# Patient Record
Sex: Female | Born: 1950 | Race: Black or African American | Hispanic: No
Health system: Midwestern US, Community
[De-identification: ages and names within clinical notes are randomized; demographics above are authoritative.]

## PROBLEM LIST (undated history)

## (undated) VITALS — BP 126/63 | HR 80 | Ht 62.0 in | Wt 192.0 lb

## (undated) VITALS — BP 98/52 | HR 88 | Ht 62.0 in | Wt 198.0 lb

## (undated) VITALS — BP 108/51 | HR 88 | Ht 62.0 in | Wt 187.0 lb

## (undated) VITALS — HR 89 | Ht 62.0 in | Wt 193.2 lb

## (undated) VITALS — BP 107/51 | HR 105 | Ht 62.0 in | Wt 184.0 lb

## (undated) VITALS — BP 130/78 | HR 86 | Ht 62.0 in | Wt 191.0 lb

## (undated) VITALS — HR 64 | Ht 62.0 in | Wt 200.0 lb

## (undated) VITALS — HR 96 | Ht 62.0 in | Wt 198.6 lb

## (undated) DIAGNOSIS — M545 Low back pain, unspecified: Secondary | ICD-10-CM

## (undated) DIAGNOSIS — R599 Enlarged lymph nodes, unspecified: Secondary | ICD-10-CM

## (undated) DIAGNOSIS — J45909 Unspecified asthma, uncomplicated: Secondary | ICD-10-CM

## (undated) DIAGNOSIS — M1990 Unspecified osteoarthritis, unspecified site: Secondary | ICD-10-CM

## (undated) DIAGNOSIS — G935 Compression of brain: Secondary | ICD-10-CM

## (undated) DIAGNOSIS — I10 Essential (primary) hypertension: Secondary | ICD-10-CM

## (undated) DIAGNOSIS — R059 Cough, unspecified: Secondary | ICD-10-CM

## (undated) DIAGNOSIS — B351 Tinea unguium: Secondary | ICD-10-CM

## (undated) DIAGNOSIS — E7800 Pure hypercholesterolemia, unspecified: Secondary | ICD-10-CM

## (undated) DIAGNOSIS — G8929 Other chronic pain: Secondary | ICD-10-CM

## (undated) DIAGNOSIS — I881 Chronic lymphadenitis, except mesenteric: Secondary | ICD-10-CM

## (undated) DIAGNOSIS — Z1159 Encounter for screening for other viral diseases: Secondary | ICD-10-CM

## (undated) DIAGNOSIS — F40240 Claustrophobia: Secondary | ICD-10-CM

## (undated) DIAGNOSIS — R7303 Prediabetes: Secondary | ICD-10-CM

## (undated) DIAGNOSIS — R9439 Abnormal result of other cardiovascular function study: Secondary | ICD-10-CM

## (undated) DIAGNOSIS — Z1231 Encounter for screening mammogram for malignant neoplasm of breast: Secondary | ICD-10-CM

## (undated) DIAGNOSIS — G5711 Meralgia paresthetica, right lower limb: Secondary | ICD-10-CM

## (undated) DIAGNOSIS — R062 Wheezing: Secondary | ICD-10-CM

## (undated) DIAGNOSIS — R079 Chest pain, unspecified: Secondary | ICD-10-CM

## (undated) DIAGNOSIS — E032 Hypothyroidism due to medicaments and other exogenous substances: Secondary | ICD-10-CM

## (undated) DIAGNOSIS — M25562 Pain in left knee: Secondary | ICD-10-CM

## (undated) DIAGNOSIS — I5032 Chronic diastolic (congestive) heart failure: Secondary | ICD-10-CM

## (undated) DIAGNOSIS — D487 Neoplasm of uncertain behavior of other specified sites: Secondary | ICD-10-CM

## (undated) DIAGNOSIS — Z1239 Encounter for other screening for malignant neoplasm of breast: Secondary | ICD-10-CM

## (undated) DIAGNOSIS — K219 Gastro-esophageal reflux disease without esophagitis: Secondary | ICD-10-CM

## (undated) DIAGNOSIS — R0609 Other forms of dyspnea: Secondary | ICD-10-CM

## (undated) DIAGNOSIS — G4731 Primary central sleep apnea: Secondary | ICD-10-CM

## (undated) DIAGNOSIS — D3A010 Benign carcinoid tumor of the duodenum: Secondary | ICD-10-CM

## (undated) DIAGNOSIS — Z01818 Encounter for other preprocedural examination: Secondary | ICD-10-CM

## (undated) DIAGNOSIS — M1612 Unilateral primary osteoarthritis, left hip: Secondary | ICD-10-CM

## (undated) DIAGNOSIS — E876 Hypokalemia: Secondary | ICD-10-CM

## (undated) DIAGNOSIS — Z1211 Encounter for screening for malignant neoplasm of colon: Secondary | ICD-10-CM

## (undated) DIAGNOSIS — R109 Unspecified abdominal pain: Secondary | ICD-10-CM

## (undated) DIAGNOSIS — M109 Gout, unspecified: Secondary | ICD-10-CM

## (undated) DIAGNOSIS — E119 Type 2 diabetes mellitus without complications: Secondary | ICD-10-CM

## (undated) DIAGNOSIS — Z96642 Presence of left artificial hip joint: Secondary | ICD-10-CM

## (undated) DIAGNOSIS — L2989 Other pruritus: Secondary | ICD-10-CM

## (undated) DIAGNOSIS — E890 Postprocedural hypothyroidism: Secondary | ICD-10-CM

## (undated) DIAGNOSIS — Z6835 Body mass index (BMI) 35.0-35.9, adult: Secondary | ICD-10-CM

## (undated) DIAGNOSIS — L298 Other pruritus: Secondary | ICD-10-CM

## (undated) DIAGNOSIS — R928 Other abnormal and inconclusive findings on diagnostic imaging of breast: Secondary | ICD-10-CM

## (undated) DIAGNOSIS — N261 Atrophy of kidney (terminal): Principal | ICD-10-CM

## (undated) DIAGNOSIS — R32 Unspecified urinary incontinence: Secondary | ICD-10-CM

## (undated) DIAGNOSIS — E669 Obesity, unspecified: Secondary | ICD-10-CM

## (undated) DIAGNOSIS — I63539 Cerebral infarction due to unspecified occlusion or stenosis of unspecified posterior cerebral artery: Principal | ICD-10-CM

## (undated) DIAGNOSIS — E66812 Obesity, class 2: Secondary | ICD-10-CM

## (undated) DIAGNOSIS — M25512 Pain in left shoulder: Secondary | ICD-10-CM

## (undated) DIAGNOSIS — T50905A Adverse effect of unspecified drugs, medicaments and biological substances, initial encounter: Secondary | ICD-10-CM

## (undated) DIAGNOSIS — E559 Vitamin D deficiency, unspecified: Secondary | ICD-10-CM

## (undated) DIAGNOSIS — K31819 Angiodysplasia of stomach and duodenum without bleeding: Secondary | ICD-10-CM

## (undated) HISTORY — DX: Gout, unspecified: M10.9

## (undated) MED ORDER — LEVOTHYROXINE 150 MCG TAB
150 mcg | ORAL_TABLET | Freq: Every day | ORAL | Status: DC
Start: ? — End: 2013-09-30

## (undated) MED ORDER — MONTELUKAST 10 MG TAB
10 mg | ORAL_TABLET | Freq: Every evening | ORAL | Status: DC | PRN
Start: ? — End: 2014-04-12

## (undated) MED ORDER — GABAPENTIN 300 MG CAP
300 mg | ORAL_CAPSULE | ORAL | Status: DC
Start: ? — End: 2014-04-09

## (undated) MED ORDER — SYNTHROID 175 MCG TABLET
175 mcg | ORAL_TABLET | ORAL | Status: DC
Start: ? — End: 2013-01-30

## (undated) MED ORDER — ROSUVASTATIN 40 MG TAB
40 mg | ORAL_TABLET | Freq: Every day | ORAL | Status: DC
Start: ? — End: 2013-04-18

## (undated) MED ORDER — SYNTHROID 175 MCG TABLET
175 mcg | ORAL_TABLET | Freq: Every day | ORAL | Status: DC
Start: ? — End: 2012-11-24

## (undated) MED ORDER — CRESTOR 40 MG TABLET
40 mg | ORAL_TABLET | ORAL | Status: DC
Start: ? — End: 2013-10-17

## (undated) MED ORDER — OLMESARTAN 40 MG TAB
40 mg | ORAL_TABLET | Freq: Every day | ORAL | Status: DC
Start: ? — End: 2013-05-30

## (undated) MED ORDER — ALBUTEROL SULFATE HFA 90 MCG/ACTUATION AEROSOL INHALER
90 mcg/actuation | Freq: Four times a day (QID) | RESPIRATORY_TRACT | Status: DC | PRN
Start: ? — End: 2014-10-24

## (undated) MED ORDER — GABAPENTIN 300 MG CAP
300 mg | ORAL_CAPSULE | Freq: Two times a day (BID) | ORAL | Status: DC
Start: ? — End: 2013-10-17

## (undated) MED ORDER — OMEPRAZOLE 40 MG CAP, DELAYED RELEASE
40 mg | ORAL_CAPSULE | Freq: Every day | ORAL | Status: DC
Start: ? — End: 2013-06-29

## (undated) MED ORDER — OMEPRAZOLE 40 MG CAP, DELAYED RELEASE
40 mg | ORAL_CAPSULE | ORAL | Status: DC
Start: ? — End: 2014-07-09

## (undated) MED ORDER — BENICAR 40 MG TABLET
40 mg | ORAL_TABLET | ORAL | Status: DC
Start: ? — End: 2013-10-17

## (undated) MED ORDER — ROSUVASTATIN 40 MG TAB
40 mg | ORAL_TABLET | ORAL | Status: DC
Start: ? — End: 2014-06-05

## (undated) MED ORDER — OLMESARTAN 40 MG TAB
40 mg | ORAL_TABLET | ORAL | Status: DC
Start: ? — End: 2014-03-08

## (undated) MED ORDER — SYNTHROID 175 MCG TABLET
175 mcg | ORAL_TABLET | ORAL | Status: DC
Start: ? — End: 2013-02-22

## (undated) MED ORDER — OLMESARTAN 20 MG TAB
20 mg | ORAL_TABLET | Freq: Every day | ORAL | Status: DC
Start: ? — End: 2012-12-21

## (undated) MED ORDER — BUDESONIDE-FORMOTEROL HFA 160 MCG-4.5 MCG/ACTUATION AEROSOL INHALER
Freq: Two times a day (BID) | RESPIRATORY_TRACT | Status: DC
Start: ? — End: 2018-05-09

## (undated) MED ORDER — SYNTHROID 150 MCG TABLET
150 mcg | ORAL_TABLET | ORAL | Status: DC
Start: ? — End: 2013-10-17

## (undated) MED ORDER — LEVOTHYROXINE 150 MCG TAB
150 mcg | ORAL_TABLET | ORAL | Status: DC
Start: ? — End: 2014-06-05

## (undated) MED ORDER — OMEPRAZOLE 40 MG CAP, DELAYED RELEASE
40 mg | ORAL_CAPSULE | ORAL | Status: DC
Start: ? — End: 2014-01-04

---

## 2002-08-30 NOTE — Procedures (Signed)
[facility]                          STRESS ECHOCARDIOGRAM REPORT   NAME:   Elaine [name], Elaine [name]                               SS#:   [id number]   DOB:     01/27/1951                                     AGE:        51   SEX:     F                                           ROOM#:     ER   MR#:    [id number]                                       DATE:   08/31/2002   REFERRING PHYS:   R. Stambaugh                            TAPE/INDEX:   221/1255   PRETEST DATA:   INDICATION:  Chest pain   MEDS TAKEN:  --   MEDS HELD:  Albuterol, Aspirin   TARGET HEART RATE:  168     85%:  143   RISK FACTORS: Cigarette 1 ppd   BASELINE ECG:  Normal sinus rhythm; within normal limits   EXERCISE SUPERVISED BY:  Sharon Apperson. RS, CS, ACNP   TEST RESULTS:             BRUCE PROTOCOL    STAGE    SPEED (MPH)   GRADE (%)    TIME (MIN:SEC)     HR       BP   Resting                                                  86     126/76      1          1.7           10            3:00         120     172/80      2          2.5           12            1:30         141       --   Recovery                               Immediate        --       --                                               2:00          95     180/74                                             4:00          88     152/74                                             6:00          81     134/76   REASON FOR STOPPING:  Dyspnea                        TOTAL EXERCISE TIME:   4:30   ACHIEVED HEART RATE:  141 (84%  max HR)              EST. METS:  6   HR RESPONSE:  Did not reach adequate heart rate      PEAK RPP:  25,380   BP RESPONSE:  Hypertensive                           CHEST PAIN:  None   OBSERVED DYSRHYTHMIAS:  None   ST SEGMENT CHANGES:  J point depression with insignificant rapidly   upsloping ST segments                                 WALL MOTION ANALYSIS   LV WALL SEGMENT             PRE-EXERCISE             POST-EXERCISE    Basal Anteroseptal             Normal                 Hyperkinesis   Basal Septal                   Normal                 Hyperkinesis   Basal Inferoseptal             Normal                 Hyperkinesis   Basal Posterior                Normal                 Hyperkinesis   Basal Lateral                  Normal                 Hyperkinesis   Basal Anterior                 Normal                 Hyperkinesis   Mid Anteroseptal               Normal                   Hyperkinesis   Mid Septal                     Normal                 Hyperkinesis   Mid Inferoseptal               Normal                 Hyperkinesis   Mid Posterior                  Normal                 Hyperkinesis   Mid Lateral                    Normal                 Hyperkinesis   Mid Anterior                   Normal                 Hyperkinesis   Apical Septal                  Normal                 Hyperkinesis   Apical Inferior                Normal                 Hyperkinesis   Apical Lateral                 Normal                 Hyperkinesis   Apical Anterior                Normal                 Hyperkinesis   LV  Chamber Size                                        Smaller   ECG INTERPRETATION:  Inadequate, but negative to achieved heart rate.   ECHO INTERPRETATION:  Negative to low achieved heart rate.   OVERALL IMPRESSION:  --   ECG AND ECHO INTERPRETATION BY :                                          JOSEPH A. ROBBINS, M.D.   tb  D: 08/31/2002  T: 09/01/2002  7:53 P    wks

## 2002-08-30 NOTE — Procedures (Signed)
[facility]                          STRESS ECHOCARDIOGRAM REPORT   NAME:   Elaine [name], Elaine [name]                               SS#:   [id number]   DOB:     1950-12-25                                     AGE:        51   SEX:     F                                           ROOM#:     ER   MR#:    [id number]                                       DATE:   08/31/2002   REFERRING PHYS:   Julious Payer                            TAPE/INDEX:   221/1255   PRETEST DATA:   INDICATION:  Chest pain   MEDS TAKEN:  --   MEDS HELD:  Albuterol, Aspirin   TARGET HEART RATE:  168     85%:  143   RISK FACTORS: Cigarette 1 ppd   BASELINE ECG:  Normal sinus rhythm; within normal limits   EXERCISE SUPERVISED BY:  Towanda Octave. RS, CS, ACNP   TEST RESULTS:             BRUCE PROTOCOL    STAGE    SPEED (MPH)   GRADE (%)    TIME (MIN:SEC)     HR       BP   Resting                                                  86     126/76      1          1.7           10            3:00         120     172/80      2          2.5           12            1:30         141       --   Recovery                               Immediate        --       --  2:00          95     180/74                                             4:00          88     152/74                                             6:00          81     134/76   REASON FOR STOPPING:  Dyspnea                        TOTAL EXERCISE TIME:   4:30   ACHIEVED HEART RATE:  141 (84%  max HR)              EST. METS:  6   HR RESPONSE:  Did not reach adequate heart rate      PEAK RPP:  25,380   BP RESPONSE:  Hypertensive                           CHEST PAIN:  None   OBSERVED DYSRHYTHMIAS:  None   ST SEGMENT CHANGES:  J point depression with insignificant rapidly   upsloping ST segments                                 WALL MOTION ANALYSIS   LV WALL SEGMENT             PRE-EXERCISE             POST-EXERCISE    Basal Anteroseptal             Normal                 Hyperkinesis   Basal Septal                   Normal                 Hyperkinesis   Basal Inferoseptal             Normal                 Hyperkinesis   Basal Posterior                Normal                 Hyperkinesis   Basal Lateral                  Normal                 Hyperkinesis   Basal Anterior                 Normal                 Hyperkinesis   Mid Anteroseptal               Normal  Hyperkinesis   Mid Septal                     Normal                 Hyperkinesis   Mid Inferoseptal               Normal                 Hyperkinesis   Mid Posterior                  Normal                 Hyperkinesis   Mid Lateral                    Normal                 Hyperkinesis   Mid Anterior                   Normal                 Hyperkinesis   Apical Septal                  Normal                 Hyperkinesis   Apical Inferior                Normal                 Hyperkinesis   Apical Lateral                 Normal                 Hyperkinesis   Apical Anterior                Normal                 Hyperkinesis   LV  Chamber Size                                        Smaller   ECG INTERPRETATION:  Inadequate, but negative to achieved heart rate.   ECHO INTERPRETATION:  Negative to low achieved heart rate.   OVERALL IMPRESSION:  --   ECG AND ECHO INTERPRETATION BY :                                          Theophilus Bones, M.D.   tb  D: 08/31/2002  T: 09/01/2002  7:53 P    wks

## 2002-08-30 NOTE — ED Provider Notes (Signed)
[facility]                      EMERGENCY DEPARTMENT TREATMENT REPORT   Observation Unit   NAME:  Elaine [name], Elaine [name]                      PT. LOCATION:     ER  [id number]   MR #:         BILLING #: [id number]          DOS: 08/30/2002   TIME: 6:46 P   50-47-39   cc:   Primary Physician:   CHIEF COMPLAINT: Chest pain.   HISTORY OF PRESENT ILLNESS: This 51 year old female presents complaining of   a one-week history of chest pain with radiation to her left neck.  She   states that this gets worse when she is walking, and it goes away when she   rests. Occasionally it is accompanies by dyspnea but no nausea, vomiting,   or diaphoresis.   REVIEW OF SYSTEMS:CONSTITUTIONAL: No fever, chills, weight loss.   EYES: No visual symptoms.   GASTROINTESTINAL:  No vomiting, diarrhea, or abdominal pain.   GENITOURINARY:  No dysuria, frequency, or urgency.   MUSCULOSKELETAL:  No joint pain or swelling.   INTEGUMENTARY:  No rashes.   Denies complaints in any other system.   PAST MEDICAL HISTORY: None.   MEDICATIONS:  Albuterol.   ALLERGIES:  No known drug allergies.   SOCIAL HISTORY: Does use tobacco, approximately one pack per day.  Does not   use alcohol.   FAMILY HISTORY: Noncontributory.   PHYSICAL EXAMINATION:   VITAL SIGNS: Blood pressure 138/74, pulse 87, respiratory rate 20,   temperature 98.2, pulse oximetry 99% on room air.   GENERAL:  Well-developed, well-nourished, black female, alert and oriented   in no acute distress.   HEENT:  Eyes:  Conjunctivae clear, lids normal.  Pupils equal, symmetrical,   and normally reactive.   Ears/Nose:  Hearing is grossly intact to voice.   Internal and external examinations of the ears are unremarkable.   Mouth/Throat:  Surfaces of the pharynx, palate, and tongue are pink, moist,   and without lesions.   Nasal mucosa, septum, and turbinates unremarkable.   Teeth and gums unremarkable.   NECK:  Supple, nontender, symmetrical, no masses or JVD, trachea midline,    thyroid not enlarged, nodular, or tender.  No cervical or submandibular   lymphadenopathy palpated.   RESPIRATORY:  Clear and equal breath sounds.  No respiratory distress,   tachypnea, or accessory muscle use.   CARDIOVASCULAR:  Heart regular, without murmurs, gallops, rubs, or thrills.   PMI not displaced.   No peripheral edema or significant varicosities.  Vascular:   Calves soft   and nontender.  No peripheral edema or significant varicosities. Carotid,   femoral, and pedal pulses are satisfactory.  The abdominal aorta is not   palpably enlarged.   GI:  Abdomen soft, nontender, without complaint of pain to palpation.  No   hepatomegaly or splenomegaly.  No abdominal or inguinal masses appreciated   by inspection or palpation.   MUSCULOSKELETAL:  Nails:  No clubbing or deformities.  Nailbeds pink with   prompt capillary refill.   SKIN:  Warm and dry without rashes.   INITIAL ASSESSMENT AND MANAGEMENT PLAN:  A patient with chest pain.  Acute   ischemic coronary disease must be considered first, and the patient   protected against the consequences  of same, while other etiologies   (including infectious, metabolic, pulmonary, gastrointestinal, and   musculoskeletal) are considered.   DIAGNOSTIC STUDIES: EKG reveals normal sinus rhythm with no ischemic   changes. Chest x-ray was unremarkable.  Cardiac Enzymes: CPK 36, troponin   0.0, CPK-MB 0.2, myoglobin 1.5.   PROCEDURES:  None.   CRITICAL CARE: None.   COURSE IN THE EMERGENCY DEPARTMENT: The patient was pain-free at the time   of evaluation, and remained pain-free throughout the remainder of her stay   in the emergency department.  She was given aspirin 2 p.o. while in the   emergency department.  Otherwise, was comfortable and stable.   CLINICAL IMPRESSION/DIAGNOSIS: Evaluation of chest pain.   DISPOSITION/PLAN: The initial emergency department evaluation of this   patient appears to be negative for evidence of an acute coronary ischemia    requiring hospital admission or urgent intervention.  However, ischemic   coronary disease has not been eliminated as a consideration.  Consequently,   the patient will be assigned to the Emergency Department Observation Unit   for serial cardiac enzymes and, if these are negative, resting and stress   echocardiography or other additional diagnostic testing.   Electronically Signed By:   Posey Pronto, M.D. 08/30/2002 19:04   ____________________________   Posey Pronto, M.D.   Mauri Reading  D:  08/30/2002  T:  08/30/2002  6:47 P   [id number]

## 2002-08-30 NOTE — Discharge Summary (Signed)
[facility]                       OUTPATIENT CENTER DISCHARGE SUMMARY   NAME:[name], Elaine   MR#:          BILLING #: [id number]      DOS: 08/30/2002   DOD:08/31/2002 TIM   P   [id number]   cc:   Primary Physician:   DATE AND TIME OF ASSIGNMENT:   08-30-02 at approximately 1910.   DATE AND TIME OF DISCHARGE:   08-31-02 at 1325.   CHIEF COMPLAINT:   Chest pain.   HISTORY OF PRESENT ILLNESS:  The patient is a 51 year old female who had   episodic chest pain radiating to her left neck.  It hurts when she walks   and coughs.  She was seen by Dr. Truddie Crumble and placed in the ED observation   unit after initial emergency department workup was negative for acute   ischemic disease.   COURSE IN THE ED OBSERVATION UNIT:  The patient did quite well with cardiac   enzymes being negative for ischemia or infarction.  She underwent stress   echocardiogram with no acute problems noted and cardiologist reviewed that   test and noted the patient had low likelihood of significant coronary   artery disease.  She has been pain free since she has been with [place] except   for occasional pain in her neck when she coughs similar to the pain that   brought her here.  She does not have it otherwise.   PHYSICAL EXAMINATION:   GENERAL:   Well-developed, well-nourished female.   NECK:  Supple, nontender, symmetrical, no masses or JVD, trachea midline,   thyroid not enlarged, nodular, or tender.   RESPIRATORY:  Clear and equal breath sounds.  No respiratory distress,   tachypnea, or accessory muscle use.   CARDIOVASCULAR:  Heart regular, without murmurs, gallops, rubs, or thrills.   PMI not displaced.   CHEST:  Chest symmetrical without masses or tenderness.   GI:  Abdomen soft, nontender, without complaint of pain to palpation.  No   hepatomegaly or splenomegaly.  No abdominal or inguinal masses appreciated   by inspection or palpation.   PSYCHIATRIC:  Oriented to time, place and person.  Mood and affect    appropriate.  Recent and remote memory appear to be intact.  Judgment   appears appropriate.   CLINICAL IMPRESSION:   1. Acute precordial chest pain, no evidence of coronary artery disease at      this time.   2. Neck pain posttussive, musculoskeletal versus pleuritic.   DISPOSITION:  The patient was started on Aleve and is to return if symptoms   worsen.  Follow up with her physician in [place].   Electronically Signed By:   Wetzel Bjornstad Arvella Merles, M.D. 08/31/2002 22:08   ____________________________   Wetzel Bjornstad. Arvella Merles, M.D.   Andria Rhein  D:  08/31/2002  T:  08/31/2002  1:53 P   [id number]

## 2002-08-31 NOTE — Procedures (Signed)
[facility]                          STRESS ECHOCARDIOGRAM REPORT   NAME:   Elaine [name], Elaine [name]                               SS#:   [id number]   DOB:     01/27/1951                                     AGE:        51   SEX:     F                                           ROOM#:     ER   MR#:    [id number]                                       DATE:   08/31/2002   REFERRING PHYS:   R. Stambaugh                            TAPE/INDEX:   221/1255   PRETEST DATA:   INDICATION:  Chest pain   MEDS TAKEN:  --   MEDS HELD:  Albuterol, Aspirin   TARGET HEART RATE:  168     85%:  143   RISK FACTORS: Cigarette 1 ppd   BASELINE ECG:  Normal sinus rhythm; within normal limits   EXERCISE SUPERVISED BY:  Sharon Apperson. RS, CS, ACNP   TEST RESULTS:             BRUCE PROTOCOL    STAGE    SPEED (MPH)   GRADE (%)    TIME (MIN:SEC)     HR       BP   Resting                                                  86     126/76      1          1.7           10            3:00         120     172/80      2          2.5           12            1:30         141       --   Recovery                               Immediate        --       --                                               2:00          95     180/74                                             4:00          88     152/74                                             6:00          81     134/76   REASON FOR STOPPING:  Dyspnea                        TOTAL EXERCISE TIME:   4:30   ACHIEVED HEART RATE:  141 (84%  max HR)              EST. METS:  6   HR RESPONSE:  Did not reach adequate heart rate      PEAK RPP:  25,380   BP RESPONSE:  Hypertensive                           CHEST PAIN:  None   OBSERVED DYSRHYTHMIAS:  None   ST SEGMENT CHANGES:  J point depression with insignificant rapidly   upsloping ST segments                                 WALL MOTION ANALYSIS   LV WALL SEGMENT             PRE-EXERCISE             POST-EXERCISE   Basal Anteroseptal              Normal                 Hyperkinesis   Basal Septal                   Normal                 Hyperkinesis   Basal Inferoseptal             Normal                 Hyperkinesis   Basal Posterior                Normal                 Hyperkinesis   Basal Lateral                  Normal                 Hyperkinesis   Basal Anterior                 Normal                 Hyperkinesis   Mid Anteroseptal               Normal                   Hyperkinesis   Mid Septal                     Normal                 Hyperkinesis   Mid Inferoseptal               Normal                 Hyperkinesis   Mid Posterior                  Normal                 Hyperkinesis   Mid Lateral                    Normal                 Hyperkinesis   Mid Anterior                   Normal                 Hyperkinesis   Apical Septal                  Normal                 Hyperkinesis   Apical Inferior                Normal                 Hyperkinesis   Apical Lateral                 Normal                 Hyperkinesis   Apical Anterior                Normal                 Hyperkinesis   LV  Chamber Size                                        Smaller   ECG INTERPRETATION:  Inadequate, but negative to achieved heart rate.   ECHO INTERPRETATION:  Negative to low achieved heart rate.   OVERALL IMPRESSION:  --   ECG AND ECHO INTERPRETATION BY :                                          JOSEPH A. ROBBINS, M.D.   tb  D: 08/31/2002  T: 09/01/2002  7:53 P    wks

## 2002-08-31 NOTE — Procedures (Signed)
[facility]                          STRESS ECHOCARDIOGRAM REPORT   NAME:   Elaine [name], Elaine [name]                               SS#:   [id number]   DOB:     February 18, 1951                                     AGE:        51   SEX:     F                                           ROOM#:     ER   MR#:    [id number]                                       DATE:   08/31/2002   REFERRING PHYS:   Julious Payer                            TAPE/INDEX:   221/1255   PRETEST DATA:   INDICATION:  Chest pain   MEDS TAKEN:  --   MEDS HELD:  Albuterol, Aspirin   TARGET HEART RATE:  168     85%:  143   RISK FACTORS: Cigarette 1 ppd   BASELINE ECG:  Normal sinus rhythm; within normal limits   EXERCISE SUPERVISED BY:  Towanda Octave. RS, CS, ACNP   TEST RESULTS:             BRUCE PROTOCOL    STAGE    SPEED (MPH)   GRADE (%)    TIME (MIN:SEC)     HR       BP   Resting                                                  86     126/76      1          1.7           10            3:00         120     172/80      2          2.5           12            1:30         141       --   Recovery                               Immediate        --       --  2:00          95     180/74                                             4:00          88     152/74                                             6:00          81     134/76   REASON FOR STOPPING:  Dyspnea                        TOTAL EXERCISE TIME:   4:30   ACHIEVED HEART RATE:  141 (84%  max HR)              EST. METS:  6   HR RESPONSE:  Did not reach adequate heart rate      PEAK RPP:  25,380   BP RESPONSE:  Hypertensive                           CHEST PAIN:  None   OBSERVED DYSRHYTHMIAS:  None   ST SEGMENT CHANGES:  J point depression with insignificant rapidly   upsloping ST segments                                 WALL MOTION ANALYSIS   LV WALL SEGMENT             PRE-EXERCISE             POST-EXERCISE   Basal Anteroseptal              Normal                 Hyperkinesis   Basal Septal                   Normal                 Hyperkinesis   Basal Inferoseptal             Normal                 Hyperkinesis   Basal Posterior                Normal                 Hyperkinesis   Basal Lateral                  Normal                 Hyperkinesis   Basal Anterior                 Normal                 Hyperkinesis   Mid Anteroseptal               Normal  Hyperkinesis   Mid Septal                     Normal                 Hyperkinesis   Mid Inferoseptal               Normal                 Hyperkinesis   Mid Posterior                  Normal                 Hyperkinesis   Mid Lateral                    Normal                 Hyperkinesis   Mid Anterior                   Normal                 Hyperkinesis   Apical Septal                  Normal                 Hyperkinesis   Apical Inferior                Normal                 Hyperkinesis   Apical Lateral                 Normal                 Hyperkinesis   Apical Anterior                Normal                 Hyperkinesis   LV  Chamber Size                                        Smaller   ECG INTERPRETATION:  Inadequate, but negative to achieved heart rate.   ECHO INTERPRETATION:  Negative to low achieved heart rate.   OVERALL IMPRESSION:  --   ECG AND ECHO INTERPRETATION BY :                                          Theophilus Bones, M.D.   tb  D: 08/31/2002  T: 09/01/2002  7:53 P    wks

## 2002-09-28 NOTE — ED Provider Notes (Signed)
[facility]                      EMERGENCY DEPARTMENT TREATMENT REPORT   ADMISSION   NAME:  Elaine [name]                    PT. LOCATION:     ER  ER14   MR #:         BILLING #: [id number]          DOS: 09/28/2002   TIME: 5:25 P   [id number]   cc:    Dimitri Ped, M.D.          DR.  Dorthy Cooler   Primary Physician:   CHIEF COMPLAINT:  Anemia, chest pain, sent by [facility] for evaluation   of anemia and chest pain.   HISTORY OF PRESENT ILLNESS:  The patient states she really has not felt   well for the past couple of months.  She has had some shortness of breath,   chest tightness, fatigue, pain in her legs.  She states she was seen here [name]   few weeks ago, underwent [name] stress test for her heart.  She states she was   told everything was okay.  She states, however, she continued not to feel   well.  She went back to [facility] today where she found she was   wheezing and anemic.  She states she got [name] treatment there and she is   feeling much better.  She is not having any chest discomfort or shortness   of breath at this time.   However, they were concerned about her anemia and   they referred her here for further evaluation.  The patient states she does   have [name] history of anemia in the past, but never like this.   She states she   does not know why she was anemic.  She states the chest pressure and   shortness of breath usually occur when she is up moving around and if she   walks around.   REVIEW OF SYSTEMS:   CONSTITUTIONAL:  No fever, chills, weight loss.   EYES: No visual symptoms.   ENT: No sore throat, runny nose or other URI symptoms.   RESPIRATORY:  Positive for shortness of breath, positive for wheezing.   CARDIOVASCULAR:  Positive for chest pressure.  No palpitations.   GASTROINTESTINAL:  No vomiting, diarrhea, or abdominal pain.   Denies any   dark, tarry stools.  No bright red blood per rectum.  The patient has    noticed some change in her bowel habits. She states over the past month or   two she has noticed that she is constipated at times, otherwise she has   loose stools.  She states she just has not been "regular" over that time   period.   MUSCULOSKELETAL:  Positive for intermittent pain in her legs.   INTEGUMENTARY:  No rashes.   NEUROLOGICAL:  No headaches, sensory or motor symptoms.   All other systems negative.   PAST MEDICAL HISTORY:   Had [name] stress test that was read as inadequate but   negative to achieved heart rate.  Echocardiogram interpretation was   negative to low achieved heart rate by cardiology.  History of asthma.   History of anemia, question etiology.   PAST SURGICAL HISTORY:   Total abdominal hysterectomy, hernia repair.   MEDICATIONS:  Albuterol inhaler.   ALLERGIES:  None.   SOCIAL HISTORY:  Positive for tobacco.  She has not traveled anywhere.  No   alcohol.   FAMILY HISTORY:   Noncontributory.   PHYSICAL EXAMINATION:   GENERAL:  This is [name] well developed female.  She is awake, alert.  Appears   comfortable, talking in complete sentences.   VITAL SIGNS:  Blood pressure 151/64, pulse 106, respirations 20,   temperature 98.6.  O2 saturation is 98% on room air.   HEENT:  Eyes:  Pupils equal, round, and reactive to light.  Conjunctivae   are slightly pale.  Mouth:  Mucous membranes pale, pink, and moist.   NECK:  Supple, nontender, symmetrical, no masses or JVD, trachea midline,   thyroid not enlarged, nodular, or tender.   RESPIRATORY:  Clear and equal breath sounds.  No respiratory distress,   tachypnea, or accessory muscle use.   CARDIOVASCULAR:  Heart regular, without murmurs, gallops, rubs, or thrills.   PMI not displaced.  Carotid pulses 2+ and equal bilaterally without bruits.   Aortic pulsation not widened.  No bruits auscultated.  DP pulses 2+ and   equal bilaterally.  Calves soft, nontender.   CHEST:  Chest symmetrical without masses or tenderness.    GI:  Abdomen soft, nontender, without complaint of pain to palpation.  No   hepatomegaly or splenomegaly.   Stool brown, guaiac negative.   EXTREMITIES:  Warm and dry.  Calves soft, nontender.SKIN:  Without rash.   NEUROLOGIC:  Cranial nerves, deep tendon reflexes, strength, and light   touch sensation are unremarkable.   PSYCHIATRIC:  Judgment appears appropriate.  Recent and remote memory   appear to be intact.   CONTINUATION BY DR. KISA:   INITIAL ASSESSMENT AND MANAGEMENT PLAN:  The patient presents here with   dyspnea on exertion as noted above.  Had [name] recent stress test but is still   having continuing symptoms.  At [facility] they felt she was anemic and   sent her over here for further evaluation.  Other etiologies also will be   evaluated for.  Nursing notes were reviewed.  As an acute illness posing [name]   potential threat to life or bodily function, this is [name] high-risk   presentation necessitating an immediate diagnostic evaluation.  Old records   reviewed.   DIAGNOSTIC TESTING:  Blood gases:  pH 7.43, pCO2 44, pO2 88 on room air.   The patient's D-dimer was normal.  Cardiac enzymes were negative for   ischemia or infarction.  The patient's potassium was low at 2.6.  The rest   of her CMP essentially within normal limits.  White count 7.1., hematocrit   26 and hemoglobin 8 on CBC, her platelets were 499.  EKG showed normal   sinus rhythm, no axis deviation, nonspecific ST-T wave changes noted.   First-degree [name]-V block noted.  Chest x-ray negative per radiologist.   COURSE IN THE EMERGENCY DEPARTMENT:  The patient was given potassium   supplementation orally and started on IV with D5   normal with 40 mEq of   potassium.  She did not have any dyspnea on exertion at rest.  The monitor   remained in sinus rhythm.  She had good saturations.  Dr. Gaynelle Adu was   consulted, as he has been recommended too by Dr. Ezzie Dural who sent the patient   here.  He admitted the patient to telemetry and will continue to workup and    treat her.   CLINICAL IMPRESSION:   1.  Acute dyspnea.   2. Acute hypokalemia and anemia.   Electronically Signed By:   Wetzel Bjornstad KISA, M.D. 10/01/2002 19:00   ____________________________   Wetzel Bjornstad. Arvella Merles, M.D.   jb/jdm/gm  D:  09/28/2002  T:  09/28/2002  5:26 P   [id number]   Fara Chute, PA

## 2002-09-29 NOTE — H&P (Signed)
[facility]                              HISTORY AND PHYSICAL   NAME:    Elaine [name], Elaine [name]   MR #:    [id number]                    ADM DATE:        09/28/2002   BILLING  [id number]                   PT. LOCATION     [id number]   #:   SS #     [id number]   Dimitri Ped, M.D.   cc:    Dimitri Ped, M.D.          Juanda Bond, M.D., C/O [facility], [address], [place],   [place]   CHIEF COMPLAINT:  Anemia and wheezing.   HISTORY OF PRESENT ILLNESS: This is a 51 year old African-American female,   a patient of Dr. Juanda Bond in [place], on whose behalf I have   admitted the patient to the hospital today.  The patient presented to   [facility] initially, the patient was not feeling well for the last   couple of months.   There was some chest tightness and shortness of breath   with fatigue and pain in her legs.  She went to [facility] today when   she was wheezing and routine workup she was found to be very anemic.   The   breathing was treated with albuterol nebulizer therapy. That took care of   the chest tightness and she was no longer short of breath at presentation   to the emergency room.  The chest tightness today and the shortness of   breath is present on exertion.   She states she does have a history of   anemia but does not know the cause for it.   PAST MEDICAL HISTORY:   1. Anemia, cause unclear.  (The patient intermittently takes iron ferrous      sulfate).   2. Asthma, bronchial.   3. No sickle cell anemia.   4. Last worked up for chest tightness and had a negative stress      echocardiogram done.   PAST SURGICAL HISTORY:   1. Total abdominal hysterectomy.   2. Hernia repair.   MEDICATIONS:   1. Albuterol metered dose inhaler, p.r.n.   2. Ferrous sulfate p.r.n.   ALLERGIES: None.   SOCIAL HISTORY: The patient smokes 1 pack a day for 20 years and does not   drink regular alcohol.  She has not any recent history of traveling.    FAMILY HISTORY:  Father died of gunshot.  Mother died of lung cancer and   had hypertension.   REVIEW OF SYSTEMS:   GENERAL:  No fever, no chills, no tiredness.   EYES:  No redness or tearing of the eyes. No blurry vision.   HEAD, ENT &amp; NECK:  No sore throat, no runny nose, no neck pain.   PULMONARY:  Shortness of breath is present, she presented to [facility]   with wheezing but that was taken care with albuterol nebulizer therapy.   Cough present with some phlegm.   CARDIOVASCULAR:  Chest tightness is present, earlier in the day.   No   palpitations.   GI:  No nausea, no vomiting. The patient does have intermittent loose  stools interspersed with constipation at times.  No  bright red blood in   the stool, no black-colored stool.   NEUROLOGICAL:  No loss of consciousness, no seizures, no headaches, no   dizziness. Positive history of snoring present.  The patient does have a   history of getting apneic. (Reported by her husband).  Day time somnolence   is present.  The patient sleeps while traveling in cars and occasionally   gets dizzy (the last one week).   EXTREMITIES: The patient does have pain from the hip going down to the legs   and both lower extremities.  No leg swelling present.   There is pain   present for the last 2 weeks.   PHYSICAL EXAMINATION:  A well-developed, well-nourished, African-American   female  in no respiratory distress.   VITAL SIGNS:  99.4 Temperature, 135/62 blood pressure, pulse 97,   respiratory rate 20.   EYES:  Extraocular movements are intact.  Pupils equal and reactive to   light and accommodation.  Nonicteric sclerae, noninjected conjunctivae.   HEAD, ENT &amp; NECK:  Neck supple, range of motion is full, there is no   thyromegaly, there is no cervical lymphadenopathy, there is no carotid   bruit, no raised JVD.  No pharyngeal injections, no sinus tenderness.  The   neck is short, there is some oropharyngeal crowding present.    CHEST:  Clear to percussion and auscultation, good air entry bilaterally   but there are harsh breath sounds present.  Earlier in the hospital she   states she had wheezing present at [facility].   CARDIOVASCULAR:  S1 and S2 normal, S3 not present, S4 not present.  No   murmurs heard.   ABDOMEN:  Bowel sounds are present, nontender, no organomegaly, soft, no   masses palpable.  The patient is obese.   EXTREMITIES:  Diffuse pain present from the hip down to the ankles but no   calf tenderness, no pedal edema present ______ admission.  Infraumbilical,   there is a lot of scar present in the midline.   NEUROLOGICAL:  Motor 5/5.  Cranial nerves II-XII are grossly intact.   Sensory is grossly intact.   RECTAL EXAM: As per emergency room physician.  Guaiac negative stool.   LABS:  139 sodium, potassium 2.6, chloride 101, bicarb 31, glucose 135, BUN   4, creatinine 0.7, liver function tests are within normal limits, total   bilirubin 0.3, calcium 8.1, total protein 7.1, albumin 3.5.  CPK 42.0.,   D-dimer 0.2 which is normal, hemoglobin 8.1, hematocrit 26.1, MCV 16.3,   platelets 499.  Chest x-ray without any infiltrates.   No CHF.  ABG 7.43,   pCO2 of 44.8 and pO2 of 88.8.  Oxygen saturation of 92.6% FIO2 room air.   O2 saturation 95.7. EKG reveals normal sinus rhythm, 104 beats per minute,   normal interval.  Normal axis.  No acute ST wave changes noted.   ASSESSMENT:   1. Hypokalemia.   2. Anemia (hematocrit 26.1, MCV 16.3), microcytic.   3. Asthma, bronchial, stable.   4. Snoring, rule out sleep apnea.   5. Obesity.   6. Hypoalbuminemia.   7. Nicotine dependent.   8. Status post hysterectomy.   PLAN:   1. Check calcium and check CBC, check liver function tests, check TSH,      check cholesterol, check phosphate.   2. Anemia workup, iron TIBC, folate, Vitamin B12, Hemoccult stools.   3. Pepcid and Colace.  4. Telemetry Unit admit.   5. (?) Amikacin, replaced.   6.   Date of service: 09/28/02.    Electronically Signed By:   Dimitri Ped, M.D. 10/23/2002 23:32   ____________________________   Dimitri Ped, M.D.   pb  D:  09/29/2002  T:  09/29/2002  7:17 A   [id number]

## 2002-10-01 NOTE — Consults (Signed)
[facility]                               CONSULTATION REPORT                     CONSULTANT:  [name], M.D.   NAME:       [name], Elaine A.   BILLING #:  [id number]             DATE OF CONSULT:     10/01/2002   MR #:       [id number]              ADM DATE:            09/28/2002   SS #        [id number]           PT. LOCATION:        [id number]   ATTENDING:  Dimitri Ped, M.D.   cc:    [name], M.D.          Dimitri Ped, M.D.          DR. Constance Goltz, [address], [facility]   I was asked to see the patient because of iron deficiency anemia.   HISTORY OF PRESENT ILLNESS: The patient is a 51 year old patient admitted   to the hospital with shortness of breath, chest tightness and found to have   significant anemia with a hemoglobin in the 7 range and MCV in the 60's.   Iron studies compatible with iron deficiency.  We are asked to see her   regarding evaluation of occult GI blood loss.  The patient denies melena or   hematochezia. She has had a little problem with constipation over the last   several months for which she takes Correctol with fairly good control. She   never had colonoscopy, upper endoscopy or small bowel x-ray in the past as   far as she is aware.  She has had mild anemia off and on in the past ever   since she has been an "adult" for which she takes ferrous sulfate   occasionally but not undergone diagnostic evaluation. She is careful not to   use nonsteroidal antiinflammatories, that I can understand, from her   discussion of how she treats herself with headaches and osteoarthritis on   an over-the-counter basis, she prefers Tylenol, she indicates, ibuprofen   causes GI upset and is careful to use nonaspirin containing medications.   She denies odynophagia or dysphagia.  Has no heartburn, nausea or vomiting.   No chronic recent abdominal pain.   She denies melena or hematochezia. No    history of hepatitis or jaundice, chronic liver disease or chronic liver   function tests, no history of peptic ulcer disease, pancreatitis, or hiatal   hernia.   Mild change in bowel pattern as above.   PAST MEDICAL HISTORY:   1. Iron deficiency anemia, intermittently as above.   2. Asthma.   3. Total abdominal hysterectomy in 1998 for benign disease.  She has had no      postmenopausal bleeding since the hysterectomy.   4. Status post umbilical hernia repair.   5. Headaches, for which she takes Tylenol.   6. Osteoarthritis for which she takes nonaspirin containing      over-the-counter regimen.   7. Lichen planus.   ALLERGIES:  None known.   MEDICATIONS ON  ADMISSION:   Albuterol MDI, ferrous sulfate as needed.   SOCIAL HISTORY: The patient is divorced.  Has a 20-pack year history of   smoking.  Smokes one pack a day.   Alcohol has been occasional. She works   at a [employer].   FAMILY HISTORY:  Positive for lung cancer in patient's mother, ASCVD, CVAs,   hypertension.  No history of colon cancer, polyps, or inflammatory bowel   disease or chronic liver disease.   REVIEW OF SYSTEMS:  Completely taken prior as per History and Physical and   will not be repeated here.   The patient indicates her asthma has become an   increasing problem over the last several months, also related to worsening   anemia, not clear, do not have a baseline hemoglobin.          LABORATORY: On 09/28/02 sodium 139,  potassium 2.6, chloride of 101,          bicarb of 31, a glucose of 125, a BUN of 4, creatinine of .7, SGOT          of 10, SGPT of 23, alkaline phosphatase is 100, bilirubin .3,          phosphorus is 4, calcium 9.1, magnesium 1.8, total bilirubin          7.1/3.5.  Cholesterol 192, CPK of 40.  Troponin of 0 and MB of 0,          relative index of 0.  TSH .03006, iron of 3, iron binding capacity          383 with percent saturation, less than 1%.   Ferritin of 1.5 (3 -           1.5). B12 is 366, normal, folic acid 7.1 which is normal.          Hematocrit 8.1, and 26.1.  On repeat was 7.7 and 25 with an MCV          between 63 and 64, platelet count 441 and 499. White count between 6          and 7.1 thousand with a normal differential.  Sickle cell screen was          negative, reticulocyte count .9.  Urinalysis dip stick negative.          D-dimer normal at .22. Chest x-ray, 2 views on October 23rd - no          evidence of infiltrate or heart failure.   IMPRESSION:   1. Iron deficiency anemia likely secondary to occult GI blood loss and the      patient is status post total abdominal hysterectomy in 1998 for benign      disease. Has had no postmenopausal or posthysterectomy bleeding. She has      hemoccult negative stool, the color is very black because she has been      on full dose iron supplementation since being in the hospital which will      interfere with a colon prep.   Will, therefore, proceed with upper      endoscopy tomorrow, try to give her some initial prep to get the iron      out of her system and, if necessary, continue prep for Tuesday's      colonoscopy.  If the patient is discharged to home, then perform      colonoscopy as an outpatient.  Small bowel x-ray may also be necessary.  In the meantime, avoid nonsteroidals completely.   2. Asthma.   3. Low TSH raises the question of hypothyroidism which certainly could be      contributing to the patient's respiratory symptoms and weakness.   PLAN:   1. Discontinue oral ferrous sulfate.   2. EGD tomorrow.   3. Citrate magnesium times 2 doses today.   4. Possible colonoscopy on Tuesday or later on as an outpatient depending      on EGD results and the patient's response to magnesium citrate.   5. Check her free thyroid index, and T3 __assay and treat the patient's      hyperthyroidism ____ as the patient's shortness of breath may be a      combination of asthma and hypothyroidism as well as anemia.    Electronically Signed By:   [name], M.D. 10/04/2002 13:07   _________________________________   [name], M.D.   pb  D:  10/01/2002  T:  10/02/2002 12:33 P   [id number]

## 2002-10-01 NOTE — Consults (Signed)
[facility]                               CONSULTATION REPORT                        CONSULTANT:  Catalina Antigua, M.D.   NAMETyson Alias   BILLING #:  [id number]             DATE OF CONSULT:     10/01/2002   MR #:       [id number]              ADM DATE:            09/28/2002   SS #        [id number]           PT. LOCATION:        [id number]   ATTENDING:   cc:    Catalina Antigua, M.D.          Dimitri Ped, M.D.   Mail copy to Dr. Richardine Service, [place], [place]   REASON FOR CONSULTATION:  Anemia.   HISTORY:  This patient is a 51 year old female who was admitted because of   shortness of breath.  The patient apparently has a long-standing history of   anemia which she has had for years.  She claims that she has been on iron   therapy before, however, has not been very complaint with her iron therapy.   It has been a few years since she last took iron pills.  She could not take   the iron pills because of severe constipation.   On admission, the patient was found to be short of breath.  She had been   wheezing.  She was noted to be quite anemic.  Her hemoglobin was 8.1 and   hematocrit was 26.1.  The platelet count was 441,000 with an MCV of 64.   The white blood cell count was 7.1.   Iron studies showed a ferritin of 1.5.  The total iron-binding capacity was   383 and iron was 3.   The patient has been started on iron therapy.  She also has been given IV   fluids and given bronchodilator therapy.  A chest x-ray shows no evidence   of acute infiltrate or disease process.   Because of her severe iron deficiency, the patient has been referred for   evaluation.   REVIEW OF SYSTEMS:  The patient complains of fatigue.  She has no weight   loss, no loss of appetite, no headaches, no dizziness, no diplopia.  She   has cough, wheezing and shortness of breath.  No hemoptysis and no   pleurisy.  She has some palpitations, no syncope and no angina.  No nausea    or vomiting.  No rectal bleeding, melena or hematemesis.  She has no   vaginal bleeding, no dysuria and no hematuria.  She has leg cramps,   especially at night.  She has some back pain.  She also complains of pica.   No delusions or hallucinations.  No joint pain or swelling.   PAST MEDICAL HISTORY:  Significant for anemia, asthma, status post   hysterectomy about 5 years ago, status post hernia repair.   MEDICATIONS:  Medications at home include albuterol inhaler p.r.n.   Current medications include albuterol inhaler  4 times a day, Colace 100   milligrams p.o. q. day, Pepcid 20 milligrams p.o. q. day, ferrous sulfate   300 milligrams p.o. t.i.d., Monistat vaginal cream, nicotine patch 21   milligrams q. day.   ALLERGIES:   None.   SOCIAL HISTORY:  The patient is a smoker.  She does not abuse alcohol.   FAMILY HISTORY:  Father died of a gunshot wound.  Mother had lung cancer.   Maternal aunt had breast cancer.   PHYSICAL EXAMINATION:  Blood pressure is 173/59, pulse rate 98, respiratory   rate 24, temperature 97.8.   GENERAL APPEARANCE:  Healthy-looking female, in mild respiratory distress.   HEENT:  Pupils equal, round and reactive to light.  Anicteric sclerae.  No   oral thrush.  No mucositis.   LYMPH NODES:  No palpable cervical, supraclavicular, axillary or inguinal   lymph nodes.   CHEST AND LUNGS:  Decreased breath sounds but clear.  No wheezing is heard.   BREASTS:  No palpable masses.   HEART: S1 and S2.  No murmurs.   ABDOMEN:  Soft.  No murmurs.  No hepatosplenomegaly.   EXTREMITIES:  No pedal edema.   NEUROLOGIC:  No focal deficits.   ASSESSMENT:  51 year old female with severe iron deficiency who is also   intolerant of oral iron.   PLAN:   1.    Medical records have been reviewed.   2.    Patient will need iron infusion.  Will give her a test of INFeD         followed by an infusion of INFeD 1 gram IV if there is no reaction to         the test dose.    3.    Patient is currently stable.  Will not give her a blood transfusion;         however, if the patient continues to have significant shortness of         breath symptoms or angina, we will have to give her a blood         transfusion.  The patient prefers not to get a transfusion which I         think is very reasonable.   4.    The patient will also need a GI workup for her iron deficiency,         especially at her age.  She will need a colonoscopy and probably an         EGD also.   5.    If the patient is discharged, I would like to see her back for         followup in a month and continue to follow her until she has complete         resolution of her iron deficiency.   Thank you for referring this patient for consultation.   Electronically Signed By:   Catalina Antigua, M.D. 10/10/2002 08:30   _________________________________   Catalina Antigua, M.D.   lo  D:  10/01/2002  T:  10/02/2002  1:53 P   [id number]

## 2002-10-02 NOTE — Procedures (Signed)
[facility]                                 PROCEDURE NOTE   NAM Elaine [name], Elaine [name] A.   E:   MR  [id number]                         DATE:            10/02/2002   #:   [name]  [id number]                      PT. LOCATION:   #   Robby Sermon, M.D.   cc:    Catalina Antigua, M.D.          Robby Sermon, M.D.          Dimitri Ped, M.D.   Extra copies to office:  0   PROCEDURE   Esophagogastroduodenoscopy with biopsy with bipolar coagulation.   ENDOSCOPIST   Dr. Leonard Downing   ASSISTANT   Ms. Young   Ms. Ramsey   SCOPE   Olympus GIF-160 video gastroscope   MEDICATIONS   Demerol 50 mg IV   Versed 2 mg IV   Topical Cetacaine spray   INDICATIONS:  51 year-old patient with iron deficiency anemia.  Must   exclude occult gastrointestinal bleeding.   DESCRIPTION OF PROCEDURE:  The patient was placed in a left lateral   decubitus position.  The oropharynx sprayed with topical Cetacaine spray.   Conscious sedation administered utilizing incremental doses of intravenous   Demerol and Versed.  The Olympus video gastroscope was inserted into the   patient's oropharynx and the esophagus intubated without difficulty.  The   endoscope was advanced under direct vision.   The entire esophageal body was unremarkable.  The squamocolumnar junction   was located at 40 cm from the incisors.  Distal to this was a small hiatal   hernia.   The gastric body and antrum appeared unremarkable.  There were no   ulcers or erosions seen.  Retroflexed view of the angularis, cardia and   fundus was normal.  There were no gastroesophageal varices.   The duodenal bulb appeared unremarkable.  The second portion was normal.   However, at the third portion/descending portion of the duodenum, were 2   less than 5-mm reddish pinpoint angiodysplasias which bled easily with   manipulation.  These angiodysplasias were fulgurated utilizing a 7 French   bipolar coagulation probe at 15 watts coagulation current.  There is  good   hemostasis.  There were no mass lesions seen.   The endoscope was completely withdrawn and the procedure terminated.  The   patient tolerated the procedure very well and was transferred to the   recovery area in stable condition.   IMPRESSION   Distal duodenal angiodysplasia, status post cautery.   PLAN   1.    Suggest colonoscopy either Wednesday or Thursday this week once the         patient has cleared the residual of oral iron she has been taking for         the past few days.  This should also be considered as an outpatient         if all of her acute medical problems have stabilized.   2.    If colonoscopy negative, suggest a small bowel follow  through.   3.    For now, hold all iron supplements.   Electronically Signed By:   Robby Sermon, M.D. 10/04/2002 16:34   _________________________________   Robby Sermon, M.D.   Italica.[email or web address]  D:  10/02/2002  T:  10/03/2002  8:23 A   [id number]

## 2002-10-02 NOTE — Procedures (Signed)
[facility]                                 PROCEDURE NOTE   NAM SHAUNTAY, BRUNELLI A.   E:   MR  [id number]                         DATE:            10/02/2002   #:   [name]  [id number]                      PT. LOCATION:   #   Robby Sermon, M.D.   cc:    Catalina Antigua, M.D.          Robby Sermon, M.D.          Dimitri Ped, M.D.   Extra copies to office:  0   PROCEDURE   Esophagogastroduodenoscopy with biopsy with bipolar coagulation.   ENDOSCOPIST   Dr. Leonard Downing   ASSISTANT   Ms. Young   Ms. Ramsey   SCOPE   Olympus GIF-160 video gastroscope   MEDICATIONS   Demerol 50 mg IV   Versed 2 mg IV   Topical Cetacaine spray   INDICATIONS:  51 year-old patient with iron deficiency anemia.  Must   exclude occult gastrointestinal bleeding.   DESCRIPTION OF PROCEDURE:  The patient was placed in a left lateral   decubitus position.  The oropharynx sprayed with topical Cetacaine spray.   Conscious sedation administered utilizing incremental doses of intravenous   Demerol and Versed.  The Olympus video gastroscope was inserted into the   patient's oropharynx and the esophagus intubated without difficulty.  The   endoscope was advanced under direct vision.   The entire esophageal body was unremarkable.  The squamocolumnar junction   was located at 40 cm from the incisors.  Distal to this was a small hiatal   hernia.   The gastric body and antrum appeared unremarkable.  There were no   ulcers or erosions seen.  Retroflexed view of the angularis, cardia and   fundus was normal.  There were no gastroesophageal varices.   The duodenal bulb appeared unremarkable.  The second portion was normal.   However, at the third portion/descending portion of the duodenum, were 2   less than 5-mm reddish pinpoint angiodysplasias which bled easily with   manipulation.  These angiodysplasias were fulgurated utilizing a 7 French   bipolar coagulation probe at 15 watts coagulation current.  There is good    hemostasis.  There were no mass lesions seen.   The endoscope was completely withdrawn and the procedure terminated.  The   patient tolerated the procedure very well and was transferred to the   recovery area in stable condition.   IMPRESSION   Distal duodenal angiodysplasia, status post cautery.   PLAN   1.    Suggest colonoscopy either Wednesday or Thursday this week once the         patient has cleared the residual of oral iron she has been taking for         the past few days.  This should also be considered as an outpatient         if all of her acute medical problems have stabilized.   2.    If colonoscopy negative, suggest a small bowel follow  through.   3.    For now, hold all iron supplements.   Electronically Signed By:   Robby Sermon, M.D. 10/04/2002 16:34   _________________________________   Robby Sermon, M.D.   Italica.Scarce  D:  10/02/2002  T:  10/03/2002  8:23 A   [id number]

## 2002-10-03 NOTE — Discharge Summary (Signed)
[facility]                                DISCHARGE SUMMARY   NAM MABREY, Elaine [name]   E:   MR  [id number]                          ADM DATE:      09/28/2002   #:   [name]  [id number]                       DIS DATE:      10/03/2002   #   Dimitri Ped, M.D.   cc:    Catalina Antigua, M.D.          Robby Sermon, M.D.          Dimitri Ped, M.D.   Mail copy to Dr. Richardine Service, c/o [facility]   FINAL DIAGNOSES:   1.    Iron deficiency anemia, severe.   2.    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   3.    Asthma, bronchial.   4.    Obesity (BMI of 34, ideal body weight 147, present weight 184         pounds).   5.    Nicotine dependence.   6.    Obstructive sleep apnea.   7.    Hypoalbuminemia.   8.    Hypokalemia.   9.    Status post hysterectomy.   10.   Arteriovenous malformation of the gastrointestinal tract.   DISCHARGE MEDICATIONS:   1.    Albuterol metered dose inhaler 2 puffs q.6h. and p.r.n. q.2h.   2.    Colace 100 milligrams p.o. once [name] day.   3.    Ferrous gluconate 325 milligrams p.o. q. day after colonoscopy is         completed.   4.    Monistat 2% vaginal cream q.h.s. x 3 days.   5.    Nicotine patch 21 milligrams q. day x 4  weeks.   6.    Flovent 110 micrograms 2 puffs b.i.d.   DIET:  Weight reducing diet.   ACTIVITIES:  As tolerated.   FOLLOWUP:  Follow up with Dr. Harley Alto (primary care physician) in 7 days,   follow up with Dr. Brand Males in 4 weeks and with Dr. Leonard Downing in 2 weeks for   outpatient colonoscopy.   REASON FOR ADMISSION:  This is [name] 51 year old African-American female, [name]   patient of Dr. Harley Alto in [place], who was admitted to the patient on   09/28/02.  The patient presented to the hospital from [facility] where   she stated she had not been feeling well for the last couple of months.   There was some chest tightness, shortness of breath, fatigue and pain in    her legs.  She was found to be wheezing at [facility], and workup there   showed that she was extremely anemic with [name] hemoglobin of 8.1, hematocrit   26.1 and MCV 76.3.  The potassium was 2.6.  Based on this, she was admitted   to the hospital for further workup.  It was presumed the patient had an   asthmatic attack and nebulizer treatment resolved that.  The patient  became   less symptomatic from wheezing.   HOSPITAL COURSE:  The patient was immediately worked up for severe anemia   with iron studies, total iron-binding capacity, ferritin, folate and   vitamin B12.  This is all enumerated in the laboratory section.  However,   of relevance is the fact that the patient's iron level was 3 and her   ferritin level was 1.5, both extremely low, with normal total iron-binding   capacity.  Hemoglobin went as low as 7.7 and hematocrit to 25.  The patient   had heme-negative stools.  She was immediately started on ferrous sulfate   325 milligrams p.o. t.i.d.  [name] hematology consult was obtained for   elucidation of severe macrocytic anemia.  The patient was given 1 dose of   IV INFeD since she was complaining of constipation, and Hematology felt she   was intolerant to p.o. iron.  Subsequently, [name] GI consultation was obtained   at their request.  They saw the patient and took the patient off iron   sulfate in preparation for GI workup.  An EGD was done on 10/27 by Dr.   Leonard Downing who found that the duodenum had 2, less than 5-millimeter,   arteriovenous malformations in the distal duodenum/third portion.  These   were cauterized successfully.  The impression was that the patient possibly   had constant bleeding from these arteriovenous malformations, but the   duodenal origin probably was not enough to explain that.  He felt most of   the arteriovenous malformations would be at the large intestinal area and   recommended [name] colonoscopy to be done as an outpatient since this required    some more preparation to have good visualization on colonoscopy.  The   patient was to be kept off the ferrous sulfate until this was completed as   an outpatient.  The patient was instructed to follow up with Dr. Leonard Downing in   order to obtain the colonoscopy as stated above.  Dr. Leonard Downing mentioned   that if the colonoscopy was unremarkable, this would need to be followed by   [name] small bowel followthrough because that may be where other  arteriovenous   malformations may be located.  The patient felt stronger after the IV INFeD   and was deemed stable for discharge today.   The patient's asthma remained stable, although she was fairly symptomatic   from shortness of breath.  She required intermittent albuterol nebulizer   therapy and was started on Flovent metered dose inhaler to help prevent   asthmatic attacks.   The patient was noted to be very obese.  She had [name] BMI of 34.  Her ideal   body weight was 147 pounds, and the patient weighed 184 pounds and was 5   feet, 2 inches in height.   The patient was noted to be snoring and having apneic episodes during my   visits to her room.  I advised the patient to undergo [name] sleep study as an   outpatient, and Dr. Harley Alto was made aware of the same.  She more than   likely would have sleep apnea.   The patient had nicotine dependence and was advised strongly against   smoking.  She was placed on [name] nicotine patch for treatment of the same.   The patient also was noted to be hypokalemic which was placed with IV   potassium chloride.  The patient's hypoalbuminemia was stable.   PROCEDURE PERFORMED:  Endoscopy.  LABORATORY DATA:  Sodium was 140, potassium 3.4 (initially was 2.6),   chloride 103, bicarbonate 30, glucose 108, BUN 6, and creatinine 0.5.   Liver function tests were within normal limits.  Phosphorus was 4, calcium   8.9, magnesium 1.8, total protein 6.5, albumin 3.2, cholesterol 192, CPK   40, troponin 0.  The iron was 3, total iron-binding capacity 383 (normal),    vitamin B12 360 (normal), folate 7.1 (normal), ferritin was 1.5.  The TSH   was 0.06 (low); however, T4 was normal at 0.91.  Hemoglobin was 7.7 and   hematocrit was 25.  The MCV was 64.3.  The platelet count was 441.  The   white blood cell count was 6.8.  Segs were 71, lymphocytes 24, monocytes 4,   and eosinophils 1.  Sickle cells negative.  Reticulocyte count 0.4 (high).   The ABG revealed pH of 7.438, PCO2 44.8, PO2 488, oxygen saturation 95.7%.   The D-dimer was 0.22 (normal).  Urinalysis was within normal limits.  Chest   x-ray revealed no infiltrates or acute heart failure.  The EKG revealed   sinus tachycardia, increased voltage, minor nonspecific ST-T wave changes.   DISPOSITION AND PLAN:  The patient was discharged in stable condition, to   be followed by Dr. Leonard Downing, Dr. Harley Alto, and Dr. Brand Males as indicated   above.  Vital signs at discharge were temperature of 99.1, blood pressure   122/62, pulse 100, respiratory rate 20.   For future admissions to [facility], please contact Dr.   Gaynelle Adu at beeper [phone] or call his office at [phone].   Electronically Signed By:   Dimitri Ped, M.D. 10/23/2002 23:31   ________________________________   Dimitri Ped, M.D.   lo  D:  10/04/2002  T:  10/04/2002  9:53 [name]   [id number]

## 2002-10-25 NOTE — Procedures (Signed)
[facility]                                 PROCEDURE NOTE   Elaine [name], Elaine [name]:   MR  [id number]                         DATE:            10/25/2002   #:   [name]  [id number]                      PT. LOCATION:   #   [name], M.D.   cc:    [name], M.D.          Dimitri Ped, M.D.   Extra copies to office:  0   PROCEDURES:   1. Colonoscopy.   2. Polypectomy.   INDICATION FOR PROCEDURE: The patient is a 51 year old black female with   iron deficiency anemia.  She had an upper endoscopy while hospitalized here   at [facility] [place] October 02, 2002, performed by Dr. Leonard Downing.   This showed a distal duodenal angiodysplasia that was cauterized.  She is   undergoing colonoscopy to rule out colonic neoplasm, polyp, AVM, mass   lesion or colitis.   ENDOSCOPIST:   Jari Favre. Dandalides, M.D.   ASSISTANTS:   Ms. Kathyrn Sheriff and Mr. Wheaton.   ENDOSCOPE:   CFQ-160L chip colonoscope.   MEDICATIONS:   Demerol 25 milligrams, Versed 3.0 milligrams IV push.   DESCRIPTION OF PROCEDURE:  After digital rectal examination revealed small   hemorrhoids in the anal canal, the CFQ-160L chip colonoscope was passed   through the cecum and into the terminal ileum for approximately 10-15   centimeters.  The preparation was good.  The terminal ileum was normal.   There were 3 small polyps removed with cold biopsy and mucosectomy   technique without complications noted, including 3 to 5 millimeter sessile   polyps in the proximal right colon, mid right colon, and 1 at 20   centimeters. No evidence of AVM, mass lesion or colitis identified.  A few   scattered diverticula in the sigmoid colon were seen.  Retroflex view of   the rectum showed no internal hemorrhoids.  External hemorrhoids could be   seen on withdrawal of the endoscope through the anal canal.  The patient   tolerated the procedure well, having had 02 saturation and blood pressure   monitored continuously with  Dinamap and pulse oximetry throughout the   procedure.  These vital signs remained stable.  He was sent to the recovery   room in good condition.   IMPRESSION: 1.  Three small polyps removed as above.   2.  External hemorrhoids.   3. Normal terminal ileum.   4. No likely colonic cause of iron deficiency seen.   PLAN:   1. Check polyp biopsies from today.   2. If adenomatous polyp, follow up in 3 years.  If hyperplastic, a 10 year      examination is recommended as long as her general health remains good      and the family history remains negative for colon cancer or polyps.   3. In terms of anemia, small bowel x-ray and complete evaluation.   4. In the meantime, re-start ferrous sulfate 325 milligrams 3 x a day with  meals and vitamin C 500 milligrams p.o. q.d. to maximize iron      absorption.  Using milk of magnesia as needed for constipation.   5. See me back in the office in 2 months time, at which point iron studies      and CBC will be repeated to make sure they are back to normal before      discontinuing iron.   6. Strictly avoid nonsteroidal anti-inflammatory drugs in the future,      although if small bowel x-ray is also nondiagnostic for cause of      bleeding it may well be the AVM in the small bowel cauterized by Dr.      Leonard Downing.  Perhaps she has a distal AVM that will not be identified by      even small bowel x-ray.   Electronically Signed By:   [name], M.D. 10/27/2002 11:35   _________________________________   [name], M.D.   hp  D:  10/25/2002  T:  10/25/2002  4:52 P   [id number]

## 2002-10-25 NOTE — Procedures (Signed)
[facility]                                 PROCEDURE NOTE   Elaine [name], Elaine [name]:   MR  [id number]                         DATE:            10/25/2002   #:   Elaine [name]  [id number]                      PT. LOCATION:   #   Elaine [name], M.D.   cc:    Elaine [name], M.D.          Dimitri Ped, M.D.   Extra copies to office:  0   PROCEDURES:   1. Colonoscopy.   2. Polypectomy.   INDICATION FOR PROCEDURE: The patient is a 51 year old black female with   iron deficiency anemia.  She had an upper endoscopy while hospitalized here   at [facility] October 02, 2002, performed by Dr. Leonard Downing.   This showed a distal duodenal angiodysplasia that was cauterized.  She is   undergoing colonoscopy to rule out colonic neoplasm, polyp, AVM, mass   lesion or colitis.   ENDOSCOPIST:   Jari Favre. Dandalides, M.D.   ASSISTANTS:   Ms. Kathyrn Sheriff and Mr. Wheaton.   ENDOSCOPE:   CFQ-160L chip colonoscope.   MEDICATIONS:   Demerol 25 milligrams, Versed 3.0 milligrams IV push.   DESCRIPTION OF PROCEDURE:  After digital rectal examination revealed small   hemorrhoids in the anal canal, the CFQ-160L chip colonoscope was passed   through the cecum and into the terminal ileum for approximately 10-15   centimeters.  The preparation was good.  The terminal ileum was normal.   There were 3 small polyps removed with cold biopsy and mucosectomy   technique without complications noted, including 3 to 5 millimeter sessile   polyps in the proximal right colon, mid right colon, and 1 at 20   centimeters. No evidence of AVM, mass lesion or colitis identified.  A few   scattered diverticula in the sigmoid colon were seen.  Retroflex view of   the rectum showed no internal hemorrhoids.  External hemorrhoids could be   seen on withdrawal of the endoscope through the anal canal.  The patient   tolerated the procedure well, having had 02 saturation and blood pressure    monitored continuously with Dinamap and pulse oximetry throughout the   procedure.  These vital signs remained stable.  He was sent to the recovery   room in good condition.   IMPRESSION: 1.  Three small polyps removed as above.   2.  External hemorrhoids.   3. Normal terminal ileum.   4. No likely colonic cause of iron deficiency seen.   PLAN:   1. Check polyp biopsies from today.   2. If adenomatous polyp, follow up in 3 years.  If hyperplastic, a 10 year      examination is recommended as long as her general health remains good      and the family history remains negative for colon cancer or polyps.   3. In terms of anemia, small bowel x-ray and complete evaluation.   4. In the meantime, re-start ferrous sulfate 325 milligrams 3 x a day with  meals and vitamin C 500 milligrams p.o. q.d. to maximize iron      absorption.  Using milk of magnesia as needed for constipation.   5. See me back in the office in 2 months time, at which point iron studies      and CBC will be repeated to make sure they are back to normal before      discontinuing iron.   6. Strictly avoid nonsteroidal anti-inflammatory drugs in the future,      although if small bowel x-ray is also nondiagnostic for cause of      bleeding it may well be the AVM in the small bowel cauterized by Dr.      Leonard Downing.  Perhaps she has a distal AVM that will not be identified by      even small bowel x-ray.   Electronically Signed By:   Elaine [name], M.D. 10/27/2002 11:35   _________________________________   Elaine [name], M.D.   hp  D:  10/25/2002  T:  10/25/2002  4:52 P   [id number]

## 2004-10-03 NOTE — Discharge Summary (Signed)
[facility]                                DISCHARGE SUMMARY   Elaine [name], Elaine [name]:   MR  [id number]                          ADM DATE:      09/26/2004   #:   SS                                    DIS DATE:   #   ANNA Blenda Nicely, M.D.   cc:    Barbie Haggis, M.D.          ANNA Blenda Nicely, M.D.   DISCHARGE DIAGNOSES:   1.     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   2.     Gastrointestinal bleed secondary to arteriovenous malformation,      status post cauterization of two arteriovenous malformations in the      duodenum.   3.     Hyperthyroidism.   4.     Asthma.   5.     Suspected hypertension.   HISTORY OF PRESENT ILLNESS:  Elaine [name] is a pleasant 53 year old African   American female who follows with Dr. Leonides Sake for her chronic anemia.   She apparently has chronic GI blood loss from known AV malformation   problems in the past and polyps as well.  She has been having black tarry   stools for the past few days and was having some shortness of breath.  She   got transfused just five days prior to this admission but has not really   improved since then.  She was therefore advised to be seen at the ER and   was subsequently admitted.   COURSE IN THE WARD:  The patient was admitted.  Dr. Leonides Sake saw her and   gave her IV iron and ordered a bone scan which turned out to just show   degenerative changes.  She was also referred to Dr. Violeta Gelinas group of GI   who scoped her on November 2 and found two AV malformations in her duodenum   and those were cauterized.  The patient's hemoglobin and hematocrit   remained stable between 9 to 10 while in the hospital.  Her coags were   normal.  Her iron level showed a low iron of 19.  TIBC 323, B12 of 427,   folate 9.5, and ferritin is 17.1.  Her chest x-ray was normal.  The patient   tolerated the procedure well.   As far as her shortness of breath, she feels that it just comes and goes    but she does have asthma for which she said she never had wheezing every   time it would get exacerbated.  She was given a short course of IV steroids   followed by prednisone taper and Advair was resumed, she was also given   Singulair and this improved her shortness of breath symptoms significantly.   DISPOSITION:  She is being discharged today is a stable and improved   condition on the following medications:   DISCHARGE MEDICATIONS:   1.     She may resume her Synthroid at the dose given to her  by primary      doctor.  Throughout her stay here she could not remember her dose and      she was just given 25 mcg of levothyroxine.   2.     Prednisone 60 mg p.o. once daily for three days, followed by 40 mg      p.o. once daily for three days, then 20 mg p.o. once daily for three      days, then discontinue.  To be taken with food.   3.     Singulair 10 mg p.o. once daily.   4.     Protonix 40 mg p.o. 30 minutes before breakfast.   5.     Ferrous sulfate 325 mg p.o. three times a day.   6.     Advair 250/50 one puff twice daily followed by rinsing the mouth.   Her blood pressures here ranged from 120 to 160 systolic.  She has been on   the steroids though.  I suggest that she be monitored frequently for her   blood pressures in case she might be a borderline hypertensive.  I   suggested that she start on a 4 gram sodium diet, try to exercise regularly   and lose some weight.   ACTIVITY:  As tolerated such as regular aerobic exercise.   CONDITION ON DISCHARGE:  Stable and improved.   Thank you very much Dr. Randa Spike for allowing [place] to take care of Mrs.   Elaine [name].   Total time spent in discharging the patient including giving instructions   and writing prescriptions took 35 minutes.   Electronically Signed By:   Barrie Folk, M.D. 10/18/2004 18:25   ________________________________   Barrie Folk, M.D.   [place].Birch  D:  10/09/2004  T:  10/09/2004  4:03 P   [id number]

## 2004-10-03 NOTE — Procedures (Signed)
[facility]                                 PROCEDURE NOTE                              Raynelle Jan, M.D.   [facility], [place]   E:   MR  [id number]                         DATE:            10/08/2004   #:   [name]  [id number]                      PT. LOCATION:    [id number]   #   Raynelle Jan, M.D.   cc:    Raynelle Jan, M.D.   Extra copies to office:  0   PROCEDURE   Upper GI endoscopy with BICAP coagulation of duodenal AVMs   INDICATIONS   A 53 year old black female with heme positive stool, anemia, NSAID use and   past history of AVMs.   INSTRUMENT   Olympus GIF-160 endoscope.   Olympus PCF-160 scope.   OPERATOR   Raynelle Jan, MD   ASSISTANT   Philbert Riser Acompanado   PREMEDICATION   Cetacaine spray to the throat.   Fentanyl 0.1 mg IV   Versed 2.5 mg IV   PROCEDURE   The Olympus pediatric video endoscope was passed into the esophagus under   direct vision.   FINDINGS   The upper, middle and lower third of the esophagus were found to have a   normal appearing mucosa without endoscopic evidence of esophagitis.  The   squamocolumnar junction was encountered at approximately 37 cm.  A hiatal   hernia was present.  The scope was passed easily through the  EG junction   into the stomach.  The fundus of the stomach was evaluated by retroflexion   of the scope.  That area appeared normal without evidence of inflammation,   ulceration or mass.  The body and antrum of the stomach were then   evaluated.  There is easy distensibility of the body and good motility was   observed to the antrum.  There is no evidence of inflammation, ulceration   or mass lesions anywhere in the body or antrum of the stomach.  The pylorus   was identified and the scope was passed through the pyloric channel into   the duodenal bulb and then was advanced well into the proximal jejunum   beyond the ligament of Treitz.  In the 3rd and 4th portion of the duodenum    2 AVMs were identified.  These were obliterated using BICAP coagulation   with setting of 25 watts.  One AVM initially bled quite briskly but this   was quickly stopped with further coagulation.   No ulcers, erosions or mass   lesions were seen anywhere in the duodenum or jejunum.  At this point, the   scope was withdrawn.  The patient tolerated the procedure well and was   returned to her room in satisfactory condition.   IMPRESSION      1. Hiatal hernia.      2. 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  3. No evidence of active bleeding.      4. Otherwise normal exam beyond ligament of Treitz.   DISCUSSION:  I suspect the patient's recent GI bleeding can be attributed   to her AVMs and NSAID use.  Patient did have a colonoscopy 2 years ago   revealing a few diminutive polyps but no other significant lesions.   SUGGESTIONS/PLANS:      1. Keep patient off of all NSAIDs and aspirin.      2. Begin ferrous sulfate 325 mg t.i.d.      3. Monitor hemoglobin and hematocrit as an outpatient.      4. Consider outpatient M2A examination to evaluate more distal small         bowel.   Electronically Signed By:   Raynelle Jan, M.D. 10/15/2004 12:58   _________________________________   Raynelle Jan, M.D.   [place]al[place].Scarce  D:  10/08/2004  T:  10/08/2004  9:49 P   [id number]

## 2004-10-03 NOTE — Consults (Signed)
[facility]                               CONSULTATION REPORT                        CONSULTANT:  Raynelle Jan, M.D.   NAME:       Elaine [name], Elaine [name]   BILLING #:  [id number]             DATE OF CONSULT:     10/07/2004   MR #:       [id number]              ADM DATE:            10/06/2004   SS #        [id number]           PT. LOCATION:        [id number]   ATTENDING:  Lisbeth Ply, M.D.   cc:    Raynelle Jan, M.D.          Lisbeth Ply, M.D.   REASON FOR CONSULTATION:  Heme positive stool and anemia.   HISTORY OF PRESENT ILLNESS:  The patient is a 53 year old black female   admitted to the hospital yesterday because of shortness of breath dating   back over the last 2 weeks.  She reports her appetite is good.  Her weight   has been stable.  Her energy level has been low.  For the last 2 weeks, she   has noted that her stools have been somewhat black and dark. She was   evaluated as an outpatient but was noted to have Hemoccult positive stools   and anemia and was recently transfused 2 units of packed cells. She is now   feeling better.  She denies any nausea, vomiting, hematemesis, dysphagia,   odynophagia, heartburn or indigestion.  There is no history of peptic ulcer   disease, jaundice, hepatitis, gallbladder disease, chronic liver disease or   pancreatitis.  She does have trouble with constipation.  She denies seeing   any red blood in her stools.  This patient was evaluated by [place]   approximately 2 years ago, October 2003 for heme positive stool and iron   deficiency anemia.  EGD revealed 2 small AVMs which were bi-cap coagulated.   Colonoscopy revealed 3 diminutive polyps, 2 of which were adenomas but no   other abnormalities.  A small bowel x-ray was normal.  Interestingly, the   patient reports she has been taking large amounts of aspirin, Advil and   other nonsteroidal agents over the last 2-3 weeks for headaches.    PAST MEDICAL HISTORY:  The patient had the usual childhood illnesses.   OPERATIONS IN THE PAST:  Tubal ligation, hysterectomy, breast biopsy,   umbilical hernia repair.   MEDICAL PROBLEMS:  Obesity, asthma, hypothyroidism, history of duodenal   AVM, history of colonic polyps.   ALLERGIES:  Benadryl.   CURRENT MEDICATIONS:  Thyroid, Advair, Singulair, Ditropan, Colace and iron   pills.   SOCIAL HISTORY:  The patient does computer work.  She is divorced.  She has   3 children alive and well.  She used to smoke for about 20 years, but quit   6 months ago.  She drinks rarely.   FAMILY HISTORY:  Father died when she was very young.  Mother has  hypertension.  Sisters have anemia.   REVIEW OF SYSTEMS:  The patient has headaches. She wears glasses.  She has   urinary frequency.  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   PHYSICAL EXAMINATION: VITAL SIGNS:  Physical examination at this time   reveals weight of 92 kilograms.  Blood pressure 146/77, pulse 80,   respirations 20.  Patient afebrile.   GENERAL:  The patient is a 53 year old well-developed, well-nourished black   female, alert and oriented in no acute distress.   HEENT EXAM:  Is negative.   NECK:  Supple without masses or adenopathy.   BACK:  No cva or spinal tenderness.   LUNGS:  Clear to auscultation and percussion.   BREASTS:  Nontender, no masses.   CARDIAC EXAM:  Regular rate and rhythm without murmurs or gallops.   ABDOMEN:  Active bowel sounds, not distended, nontender, no masses.  No   organomegaly.   RECTAL:  Good sphincter tone.  No masses palpated.  Stool is mahogany in   color, strongly Hemoccult positive.   EXTREMITIES:  No clubbing, cyanosis, or edema.   MOST RECENT LABORATORY: Hemoglobin is 10.9, hematocrit 33.2.  TSH 23.22.   Iron 19.  Iron binding capacity 323.  Ferritin 17.1.  BUN 11.  Creatinine   0.5.   IMPRESSION:  Heme positive stool and iron deficiency anemia.   DISCUSSION:  Gastrointestinal workup 2 years ago revealed AVMs in the    duodenum but no other significant lesions.  Her current heme positive   stools and anemia may be related to nonsteroidal use.   SUGGESTIONS AND PLANS:   1. Keep off nonsteroidal agents.   2. Treat empirically with proton pump inhibitors.   3. Monitor H&amp;H.   4. Will plan EGD in a.m.   5. Further recommendations after above.   Thank you very much.   Electronically Signed By:   Raynelle Jan, M.D. 10/08/2004 15:42   _________________________________   Raynelle Jan, M.D.   hp  D:  10/07/2004  T:  10/07/2004  3:45 P   [id number]

## 2004-10-03 NOTE — H&P (Signed)
[facility]                              HISTORY AND PHYSICAL                             Thomes Dinning, M.D.   NAME:    Elaine [name], Elaine [name]   MR #:    [id number]                    ADM DATE:        10/06/2004   BILLING  [id number]                   PT. LOCATION     [id number]   #:   SS #     [id number]   Thomes Dinning, M.D.   cc:    Thomes Dinning, M.D.   CHIEF COMPLAINT:  "I have been weak and short of breath."  The informant is   the patient who is reliable.   HISTORY OF PRESENT ILLNESS:  This is a 53 year old African-American female   with a history of anemia secondary to GI blood loss from polyp bleeding   about 3 years ago.  She was admitted to the hospital with a 2 week history   of progressive shortness of breath associated with black, tarry stools and   low back pain.  There was no chest pain or pressure.  No nausea, no   diaphoresis admitted to.  She is unaware of anything that could have   aggravated the situation such as nonsteroidal antiinflammatory use or   alcohol use.  The patient was seen by her hematologist on Friday, 5 days   prior to this admission.  At that time she was found to be anemic and   transfused 2 units, she believes.  She was set up for outpatient workup,   but continued to have the dark stools and the shortness of breath; for this   reason she presented to the emergency department for further evaluation and   treatment.   PAST HISTORY:  See above.  Her colonoscopy was done 3 years ago by Dr.   Leonard Downing.  She has a history of hypothyroidism, asthma and the   aforementioned anemia.  She does not admit to any history of high blood   pressure, diabetes, cancer, heart disease, or strokes.   SOCIAL HISTORY:   A 30 pack year history of cigarette smoking, stopped 6   months ago.  No significant alcohol use admitted to.  No recreational drug   use admitted to.   FAMILY HISTORY:   All of her sisters are anemic, mother was anemic and has    high blood pressure.  Father died when the patient was 27 years of age, so   the patient has very little information about his illnesses.   ALLERGIES:  No known drug allergies.  The patient states that Benadryl   causes her arms to move uncontrollably.  There is no rash or shortness of   breath when she takes this medication.   REVIEW OF SYSTEMS:   GENERAL:  No fever or chills, no weight change admitted to.  Appetite is   adequate.   HEAD:  No lightheadedness or loss of consciousness.   EYES:  No visual changes, no blurred vision.   EARS:  No difficulty with  hearing.   ORALLY:  No sores in the mouth, no difficulty swallowing.   NOSE:  No runny nose or nasal congestion.   ENDOCRINE:  Denies any history of heat or cold intolerance.  She does have   a history of hypothyroidism.  No polyuria or polydipsia.   HEMATOLOGIC/LYMPHATIC:  No glandular swelling, no easy bruising or   bleeding.   RESPIRATORY:  The patient has a chronic cough from her asthma.  It has not   worsened recently.   CARDIOVASCULAR:  See above.  No known history of coronary artery disease or   valvular heart disease.  No history of congestive heart failure.  She   sleeps on one pillow.   GI:  See above.  No history of peptic ulcer disease, no history of hepatic   or pancreatic disease.   GU:  See above.  No dysuria, frequency or urgency.  No kidney or bladder   disease admitted to.   MUSCULOSKELETAL:  Lower back pain as stated above.  No joint pain, no   peripheral edema as far as she knows.   INTEGUMENT:  No new skin lesions, rashes or ulcers.  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   NEUROLOGICAL:  Generalized weakness, no seizures, no CVAs, no paresthesias   admitted to.   PSYCHIATRIC:  No suicidal or homicidal ideation.   PAST SURGICAL HISTORY:  Cyst removed, hysterectomy and hernia repair.   MEDICATIONS:  Doses unknown, thyroid medication, Advair dose unknown,    Singulair presumably 10 mg q.d., Ditropan, dose unknown, Colace presumably   100 mg b.i.d. and iron dose unknown.   PHYSICAL EXAMINATION:   VITAL SIGNS:  Orthostatics were done in the emergency room and the patient   is not orthostatic at this time.  Blood pressure 161/69, pulse 93,   respiratory rate 19, 100% saturation on room air.   GENERAL:  She is well-developed, well-nourished, mildly overweight, in no   acute distress except for a cough.   HEENT:  Head normocephalic, no bruits, no signs of trauma appreciated.   Eyes anicteric, conjunctivae not pale, full range of ocular motion noted.   Nasally, no lesions noted.  Orally, good teeth repair, normal swallow.   NECK:  Supple, no masses, no bruits noted, no JVD.   LYMPHATICS:  None in the head, neck, axillary or groin regions.   LUNGS:  There is 1-2+ wheezing noted bilaterally and a frequent cough is   noted that is dry.   CARDIAC:  Regular rate and rhythm, no murmur, rub or gallop appreciated.   ABDOMEN:  Soft, bowel sounds good, no bruits appreciated, no pain to   palpation noted.   RECTAL:  Done in the emergency room with dark, heme-positive stool   obtained.   EXTREMITIES:  Trace edema of the lower extremities appreciated.  No   deformities.  Strength normal.   NEUROLOGICAL:  Oriented x 3.  Cranial nerves II-XII are intact.  Reflexes   1+/4+ equal bilaterally.  Touch sensation grossly within normal limits.   INTEGUMENT:  Old hyperpigmented macules noted on the arms and trunk.   DIAGNOSTIC TESTS:  Sodium 140, potassium 3.9, chloride 104, CO2 30, glucose   73, BUN 11, creatinine 0.5, calcium 9.0, white blood cell count 7,100,   hemoglobin 9.6, platelet count 372,000, segs 79, lymphs 14, monos 6, eos 1,   basophils 1.  TSH 23.22.  IMPRESSION &amp; PLAN:      1. Gastrointestinal blood loss.  Plan support with fluids, serial      hemoglobin and hematocrit, transfuse as needed, gastrointestinal       consultation for possible endoscopy, anemia panel.  Type and screen.      2. Hypothyroidism.  Apparently on a subtherapeutic thyroid dose.  Will      initiate thyroid replacement therapy until we know what the exact dose      of her medications are.      3. Asthma with frequent cough.  Check chest x-ray, hand held nebulizer      treatments, resume Singulair and Advair once we know what the dose is.      Nebulizer treatments and IV Solu-Medrol.   Electronically Signed By:   Thomes Dinning, M.D. 10/08/2004 06:58   ____________________________   Thomes Dinning, M.D.   Keltoi.Punches  D:  10/07/2004  T:  10/07/2004  7:26 A   [id number]

## 2004-10-06 NOTE — ED Provider Notes (Signed)
[facility]                      EMERGENCY DEPARTMENT TREATMENT REPORT   ADMISSION   NAME:  Elaine [name], Elaine [name]             PT. LOCATION:     [id number]   MR #:         BILLING #: [id number]          DOS: 10/06/2004   TIME: 1:30 P   [id number]   cc:   Primary Physician:   CHIEF COMPLAINT:  Weakness.   HISTORY OF PRESENT ILLNESS:  [age]-year-old female with a history of   anemia and asthma who came in today after transfusion on Friday secondary   to continued anemia or thinks that she is anemic and wheezing, sent in by   Dr. Jodie Echevaria.  The patient denies any nausea, vomiting, diarrhea, fever, aches,   or chills.  Does admit to 2 weeks of black tarry stools and was transfused   on Friday and still feels somewhat uncomfortable.  The patient had a polyps   a few years ago with similar symptoms and thinks it is maybe a recurrence   of that issue.   The patient denies chest pain however gets short of breath   with exertion.   REVIEW OF SYSTEMS:   CONSTITUTIONAL:  No fever, chills, weight loss.   EYES: No visual symptoms.   ENT: No sore throat, runny nose or other URI symptoms.   ENDOCRINE:  History of hypothyroidism.   HEMATOLOGIC/LYMPHATIC:  No excessive bruising or lymph node swelling.   History of anemia.   ALLERGIC/IMMUNOLOGIC:  No urticaria or allergy symptoms.   RESPIRATORY:  History of wheezing and shortness of breath.   CARDIOVASCULAR:  No chest pain, chest pressure, or palpitations.   GASTROINTESTINAL:  Black tarry stools.   GENITOURINARY:  No dysuria, frequency, or urgency.   MUSCULOSKELETAL:  No joint pain or swelling.   INTEGUMENTARY:  No rashes.   NEUROLOGICAL:  Some generalized weakness.   PSYCHIATRIC:  No suicidal or homicidal ideation.   PAST MEDICAL HISTORY:  Unknown anemia, asthma.   PAST SURGICAL HISTORY:  Cysts, hysterectomy, hernia repair.   MEDICATIONS:  Thyroid medication, Advair, Singulair, Ditropan, Colace,   iron.   SOCIAL HISTORY:  Nonsmoker.    ALLERGIES:  Benadryl.   PHYSICAL EXAMINATION:   GENERAL:   The patient is alert and oriented x 3, in no apparent distress.   The patient appears well developed and well nourished.  Appearance and   behavior are age and situation appropriate.   VITAL SIGNS:  Blood pressure 140/84, pulse 92, respirations 24, temperature   98.2, O2 saturation 100% on room air.   HEENT:  Eyes:  Conjunctivae clear, lids normal.  Pupils equal, symmetrical,   and normally reactive.   Ears/Nose:  Hearing is grossly intact to voice.   Internal and external examinations of the ears are unremarkable.   Mouth/Throat:  Surfaces of the pharynx, palate, and tongue are pink, moist,   and without lesions.   RESPIRATORY:  Clear and equal breath sounds.  No respiratory distress,   tachypnea, or accessory muscle use.   CARDIOVASCULAR:  Heart regular, without murmurs, gallops, rubs, or thrills.   PMI not displaced.   GI:  Abdomen soft, nontender, without complaint of pain to palpation.  No   hepatomegaly or splenomegaly. No abdominal or inguinal masses appreciated   by inspection or palpation.  RECTAL:  Some small brown stool with good sphincter tone.  It was heme   positive.   MUSCULOSKELETAL:  Nails:  No clubbing or deformities.  Nail beds pink with   prompt capillary refill.   SKIN:  Warm and dry without rashes.   NEUROLOGIC:  Cranial nerves, deep tendon reflexes, strength, and light   touch sensation are unremarkable.   PSYCHIATRIC:  Judgment appears appropriate.  Recent and remote memory   appear to be intact.  Oriented to time, place, and person.  Mood and affect   appropriate.   IMPRESSION AND MANAGEMENT PLAN:  The patient will have CBC, BMP, and type   and screen and will do thyroid as well as orthostatics to assess if the   patient is anemic versus other etiology.  This is an acute exacerbation of   a chronic condition for this patient.  Nursing notes were reviewed.   COURSE IN THE EMERGENCY DEPARTMENT:  The patient remained stable and    started eating in the emergency department.  Discussed with Dr. Jodie Echevaria, who   requested that the patient be up to her but initially tried to admit her   and Dr. Leonard Downing who is onboard regarding scoping requested a hospitalist's   admission and therefore discussed with the hospitalist.  Dr. Blinda Leatherwood will   admit the patient for anemia and GI bleed and Dr. Leonard Downing will follow up.   DIAGNOSTIC TESTING:   General chemistry panel was normal.  TSH 23.22.  CBC   with white count 7.1, hemoglobin and hematocrit 9.6 and 29.6, platelets   372, 79% segs.   FINAL DIAGNOSES:      1. Anemia.      2. Gastrointestinal bleed.   DISPOSITION:  The patient admitted under hospitalists with Dr. Leonard Downing, GI   consultation.   Electronically Signed By:   Lawrence Marseilles, M.D. 10/07/2004 13:46   ____________________________   Lawrence Marseilles, M.D.   cd/cd  D:  10/06/2004  T:  10/06/2004  7:03 P   [id number]

## 2004-10-08 NOTE — Procedures (Signed)
[facility]                                 PROCEDURE NOTE                              Raynelle Jan, M.D.   [facility], [place]   E:   MR  [id number]                         DATE:            10/08/2004   #:   [name]  [id number]                      PT. LOCATION:    [id number]   #   Raynelle Jan, M.D.   cc:    Raynelle Jan, M.D.   Extra copies to office:  0   PROCEDURE   Upper GI endoscopy with BICAP coagulation of duodenal AVMs   INDICATIONS   A 53 year old black female with heme positive stool, anemia, NSAID use and   past history of AVMs.   INSTRUMENT   Olympus GIF-160 endoscope.   Olympus PCF-160 scope.   OPERATOR   Raynelle Jan, MD   ASSISTANT   Philbert Riser Acompanado   PREMEDICATION   Cetacaine spray to the throat.   Fentanyl 0.1 mg IV   Versed 2.5 mg IV   PROCEDURE   The Olympus pediatric video endoscope was passed into the esophagus under   direct vision.   FINDINGS   The upper, middle and lower third of the esophagus were found to have a   normal appearing mucosa without endoscopic evidence of esophagitis.  The   squamocolumnar junction was encountered at approximately 37 cm.  A hiatal   hernia was present.  The scope was passed easily through the  EG junction   into the stomach.  The fundus of the stomach was evaluated by retroflexion   of the scope.  That area appeared normal without evidence of inflammation,   ulceration or mass.  The body and antrum of the stomach were then   evaluated.  There is easy distensibility of the body and good motility was   observed to the antrum.  There is no evidence of inflammation, ulceration   or mass lesions anywhere in the body or antrum of the stomach.  The pylorus   was identified and the scope was passed through the pyloric channel into   the duodenal bulb and then was advanced well into the proximal jejunum   beyond the ligament of Treitz.  In the 3rd and 4th portion of the duodenum   2 AVMs were identified.  These were  obliterated using BICAP coagulation   with setting of 25 watts.  One AVM initially bled quite briskly but this   was quickly stopped with further coagulation.   No ulcers, erosions or mass   lesions were seen anywhere in the duodenum or jejunum.  At this point, the   scope was withdrawn.  The patient tolerated the procedure well and was   returned to her room in satisfactory condition.   IMPRESSION      1. Hiatal hernia.      2. 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  3. No evidence of active bleeding.      4. Otherwise normal exam beyond ligament of Treitz.   DISCUSSION:  I suspect the patient's recent GI bleeding can be attributed   to her AVMs and NSAID use.  Patient did have a colonoscopy 2 years ago   revealing a few diminutive polyps but no other significant lesions.   SUGGESTIONS/PLANS:      1. Keep patient off of all NSAIDs and aspirin.      2. Begin ferrous sulfate 325 mg t.i.d.      3. Monitor hemoglobin and hematocrit as an outpatient.      4. Consider outpatient M2A examination to evaluate more distal small         bowel.   Electronically Signed By:   Raynelle Jan, M.D. 10/15/2004 12:58   _________________________________   Raynelle Jan, M.D.   [place].Scarce  D:  10/08/2004  T:  10/08/2004  9:49 P   [id number]

## 2007-02-19 NOTE — Procedures (Signed)
[facility]                          STRESS ECHOCARDIOGRAM REPORT   NAME:   Elaine [name], Elaine [name]                               SS#:   [id number]   DOB:     03/21/1951                                     AGE:        56   SEX:     F                                           ROOM#:     ERO EO05   MR#:    [id number]                                       DATE:   02/19/2007   REFERRING PHYS:   Dr. Owens                               TAPE/INDEX:   PRIMARY PHYSICIAN:  Dr. Elizabeth Forrest   PRETEST DATA:   INDICATION:  Chest pain   MEDS TAKEN:  --   MEDS HELD:  Singulair, Protonix, Synthroid, and Zocor.   TARGET HEART RATE:  100% 163     85%:  138   RISK FACTORS:  Hypertension; lipids.   BASELINE ECG:  Normal sinus rhythm; QS V1 and V2   EXERCISE SUPERVISED BY:  --   TEST RESULTS:             BRUCE PROTOCOL     STAGE    SPEED (MPH)   GRADE (%)    TIME (MIN:SEC)     HR       BP   Resting                                                   95     153/70       1          1.7           10            3:00         131     190/90       2          2.5           12            1:00         144       --   Recovery                                             Immediate                                                         --                                                         --                                                        2:00                                                         92                                                       189/69                                                        4:00                                                         93                                                       153/64                                                        6:00                                                         --                                                         --     REASON FOR STOPPING:  Dyspnea                   TOTAL EXERCISE TIME:  4:00,   1 minute at  peak stage 2   ACHIEVED HEART RATE:  144 (88%  max HR)                 EST. METS:  --   HR RESPONSE:  Normal                                    PEAK RPP:  --   BP RESPONSE:  Hypertensive                              CHEST PAIN:  None   OBSERVED DYSRHYTHMIAS:  None   ST SEGMENT CHANGES:  None.   CONCLUSION:  Negative treadmill exercise tolerance test.       WALL MOTION ANALYSIS   LV WALL SEGMENT              PRE-EXERCISE             POST-EXERCISE   Basal Anteroseptal              Normal                 Hyperkinesis   Basal Septal                    Normal                 Hyperkinesis   Basal Inferoseptal              Normal                 Hyperkinesis   Basal Posterior                 Normal                 Hyperkinesis   Basal Lateral                   Normal                 Hyperkinesis   Basal Anterior                  Normal                 Hyperkinesis   Mid Anteroseptal                Normal                 Hyperkinesis   Mid Septal                      Normal                 Hyperkinesis   Mid Inferoseptal                Normal                 Hyperkinesis   Mid Posterior                   Normal                 Hyperkinesis     Mid Lateral                     Normal                 Hyperkinesis   Mid Anterior                    Normal                 Hyperkinesis   Apical Septal                   Normal                 Hyperkinesis   Apical Inferior                 Normal                 Hyperkinesis   Apical Lateral                  Normal                 Hyperkinesis   Apical Anterior                 Normal                 Hyperkinesis   LV  Chamber Size                                         Smaller   ECG INTERPRETATION:  Negative by ECG criteria.   ECHO INTERPRETATION:  Normal wall motion.   OVERALL IMPRESSION:  Low likelihood for coronary artery disease.   ECG AND ECHO INTERPRETATION BY :                                           D. JAMES KAZAKIS, M.D.   ndt  D: 02/19/2007  T: 02/19/2007  3:14 P     [id number]   cc:    D. JAMES KAZAKIS, M.D.          MICHAEL D. OWENS, M.D.

## 2007-02-19 NOTE — Discharge Summary (Signed)
[facility]                       OUTPATIENT CENTER DISCHARGE SUMMARY   NAME:  Elaine [name]           PT. LOCATION:      ERO EO05          DOB:                                                                         AGE:   MR #:      BILLING #:           DOA:  02/18/2007   DOD:              SEX:  F   [id number]   [id number]   cc:   Primary Physician:   ASSIGNMENT DATE AND TIME:  02/19/07 at 3:10.   ASSIGNMENT DIAGNOSIS:  Chest pain.   DISCHARGE DATE AND TIME:  02/19/07 at 1420.   DISCHARGE DIAGNOSIS:  Precordial chest pain, cardiac unlikely.   HISTORY:  This is a 56 year old female who presented to the emergency room   with 48 hours of dyspnea and chest pressure, substernally.   The Emergency Department evaluation was unrevealing and the patient was   assigned to the outpatient center for serial cardiac enzymes and stress   echocardiography.   COURSE IN THE UNIT:  The patient remained stable and comfortable.  At   discharge, she was breathing normally without chest pain.  She received no   medications.   The patient was assigned to the outpatient center for serial cardiac   enzymes and resting and stress echocardiography.  Results of serial   measurements of serum myoglobin, CPK, and MB, and serum Troponin-I over 9   hours gave no indication of acute myocardial damage.  The patient then   underwent resting and exercise 2-D echocardiography and exercise test under   the supervision of [facility].  Their final impression was   of "Low likelihood of CAD."   EXAMINATION AT DISPOSITION:   VITAL SIGNS:  Stable.  Free of chest pain or dyspnea.   GENERAL APPEARANCE:  Patient appears well developed and well nourished.   Appearance and behavior are age and situation appropriate.   CONSTITUTIONAL: No fever, chills, or weight loss.   CARDIOVASCULAR:  Regular rate and rhythm.   No murmurs, gallops, or rubs.   ABDOMEN:  Soft and nontender.  No complaints.    SKIN:  Warm and dry.   DISPOSITION/PLAN:  Follow up with PCP.  Return with any increased or new   signs or symptoms.   The probabilistic nature of cardiac diagnostic testing was explained.  The   patient was counseled to seek further cardiac evaluation should symptoms   worsen or persist without another diagnosis being found.   Electronically Signed By:   Marijo Sanes, M.D. 03/06/2007 03:51   ____________________________   Marijo Sanes, M.D.   My signature above authenticates this document and my orders, the final   diagnosis(es), discharge prescription(s) and instructions in the [facility] record.   st  D:  02/19/2007  T:  02/20/2007  6:04 P  [id number]   JEFF WOLFE, PA-C

## 2007-02-19 NOTE — Procedures (Signed)
[facility]                          STRESS ECHOCARDIOGRAM REPORT   NAME:   Elaine [name], Elaine [name]                               SS#:   [id number]   DOB:     06/30/1951                                     AGE:        56   SEX:     F                                           ROOM#:     ERO EO05   MR#:    [id number]                                       DATE:   02/19/2007   REFERRING PHYS:   Dr. Owens                               TAPE/INDEX:   PRIMARY PHYSICIAN:  Dr. Elizabeth Forrest   PRETEST DATA:   INDICATION:  Chest pain   MEDS TAKEN:  --   MEDS HELD:  Singulair, Protonix, Synthroid, and Zocor.   TARGET HEART RATE:  100% 163     85%:  138   RISK FACTORS:  Hypertension; lipids.   BASELINE ECG:  Normal sinus rhythm; QS V1 and V2   EXERCISE SUPERVISED BY:  --   TEST RESULTS:             BRUCE PROTOCOL     STAGE    SPEED (MPH)   GRADE (%)    TIME (MIN:SEC)     HR       BP   Resting                                                   95     153/70       1          1.7           10            3:00         131     190/90       2          2.5           12            1:00         144       --   Recovery                                             Immediate                                                         --                                                         --                                                        2:00                                                         92                                                       189/69                                                        4:00                                                         93                                                       153/64                                                        6:00                                                         --                                                         --     REASON FOR STOPPING:  Dyspnea                   TOTAL EXERCISE TIME:  4:00,    1 minute at peak stage 2   ACHIEVED HEART RATE:  144 (88%  max HR)                 EST. METS:  --   HR RESPONSE:  Normal                                    PEAK RPP:  --   BP RESPONSE:  Hypertensive                              CHEST PAIN:  None   OBSERVED DYSRHYTHMIAS:  None   ST SEGMENT CHANGES:  None.   CONCLUSION:  Negative treadmill exercise tolerance test.       WALL MOTION ANALYSIS   LV WALL SEGMENT              PRE-EXERCISE             POST-EXERCISE   Basal Anteroseptal              Normal                 Hyperkinesis   Basal Septal                    Normal                 Hyperkinesis   Basal Inferoseptal              Normal                 Hyperkinesis   Basal Posterior                 Normal                 Hyperkinesis   Basal Lateral                   Normal                 Hyperkinesis   Basal Anterior                  Normal                 Hyperkinesis   Mid Anteroseptal                Normal                 Hyperkinesis   Mid Septal                      Normal                 Hyperkinesis   Mid Inferoseptal                Normal                 Hyperkinesis   Mid Posterior                   Normal                 Hyperkinesis     Mid Lateral                     Normal                 Hyperkinesis   Mid Anterior                    Normal                 Hyperkinesis   Apical Septal                   Normal                 Hyperkinesis   Apical Inferior                 Normal                 Hyperkinesis   Apical Lateral                  Normal                 Hyperkinesis   Apical Anterior                 Normal                 Hyperkinesis   LV  Chamber Size                                         Smaller   ECG INTERPRETATION:  Negative by ECG criteria.   ECHO INTERPRETATION:  Normal wall motion.   OVERALL IMPRESSION:  Low likelihood for coronary artery disease.   ECG AND ECHO INTERPRETATION BY :                                           D. JAMES KAZAKIS, M.D.    ndt  D: 02/19/2007  T: 02/19/2007  3:14 P    [id number]   cc:    D. JAMES KAZAKIS, M.D.          MICHAEL D. OWENS, M.D.

## 2007-02-19 NOTE — ED Provider Notes (Signed)
OBSERVATION                           [facility]                      EMERGENCY DEPARTMENT TREATMENT REPORT   NAME:  Elaine [name], Elaine [name]            PT. LOCATION:    ERO EO05       DOB:  [birth year]                                                                     AGE:  56   MR #:       BILLING #:           DOS: 02/18/2007  TIME:12:17 A   SEX:  F   [id number]    [id number]   cc:   Primary Physician:   Blair Hailey. Randa Spike, M.D.   TIME SEEN:  2345   CHIEF COMPLAINT:  "Chest pain, can't breathe."   HISTORY OF PRESENT ILLNESS:  This is a 56 year old female who comes in   saying that she has been short of breath since yesterday.  It has been   about 48 hours of dyspnea, especially dyspnea on exertion.  She has had a   dry cough, but usually has that from her asthma.  She has not had any   fevers, sweats, or chills.  She has never felt this way before.  She has a   heavy feeling with pressure on her chest.  It is not painful to take a deep   breath.  She denies other complaint or concern.   REVIEW OF SYSTEMS:   CONSTITUTIONAL:  No fevers.  No chills.   EYES: No visual symptoms.   ENT: No sore throat, runny nose or other URI symptoms.   RESPIRATORY:  See history of present illness.   CARDIOVASCULAR:  See history of present illness.   GASTROINTESTINAL:  No vomiting, diarrhea, or abdominal pain.   MUSCULOSKELETAL:  No joint pain or swelling.   INTEGUMENTARY:  No rashes.   NEUROLOGICAL:  No headaches, sensory or motor symptoms.   Denies complaints in any other system.   PAST MEDICAL HISTORY:  Asthma, hypertension, high cholesterol.   SOCIAL HISTORY:  Prior smoker, quit 3 years ago.  Is here with her family.   MEDICATIONS:  Advair, Singulair, Protonix, Zocor.   ALLERGIES:  Aspirin.   PHYSICAL EXAMINATION:   VITAL SIGNS:  Blood pressure 165/88, pulse 84, respirations 18, temperature   97.5, oxygen saturation 98% on room air, no pain.   GENERAL APPEARANCE:  The patient appears well developed and well nourished.    Appearance and behavior are age and situation appropriate.   HEENT:  Eyes:  Conjunctivae clear, lids normal.  Pupils equal, symmetrical,   and normally reactive.  Mouth/Throat:  Surfaces of the pharynx, palate, and   tongue are pink, moist, and without lesions.   NECK:  Supple, nontender, symmetrical, no masses or JVD, trachea midline,   thyroid not enlarged, nodular, or tender.   LYMPHATICS:  No cervical or submandibular lymphadenopathy palpated.   RESPIRATORY:  Clear and equal breath sounds.  No  respiratory distress,   tachypnea, or accessory muscle use.   CARDIOVASCULAR:  Heart regular, without murmurs, gallops, rubs, or thrills.   PMI not displaced.   GI:  Abdomen soft, nontender, without complaint of pain to palpation.  No   hepatomegaly or splenomegaly.   SKIN:  Warm and dry without rashes.   NEUROLOGICAL:  Awake, alert, oriented.  No deficits focally.   CONTINUATION BY ERIN ICENBICE, PA-C:   IMPRESSION/MANAGEMENT PLAN:  A patient with chest pain.  Acute ischemic   coronary disease must be considered first, and the patient protected   against the consequences of same, while other etiologies (including   infectious, metabolic, pulmonary, gastrointestinal, and musculoskeletal)   are considered.   DIAGNOSTIC TESTING:  CBC, BMP, and cardiac profile are unremarkable.  BTNP   63.5.  The patient's CBC did show a hemoglobin and hematocrit of 9 and 27,   but she has a history of anemia in the past.  This is baseline for her.   EKG read as a normal sinus rhythm without acute ischemic injury pattern.   Chest x-ray interpreted by Dr. Pasty Arch showing no acute process.   COURSE IN THE EMERGENCY DEPARTMENT:  The patient was given an albuterol and   Atrovent nebulizer.  She remained comfortable.  Dr. Barry Dienes was in to see   her.  At this time discussed with her staying for chest pain protocol.  She   did not want aspirin as she has had a history of 3 ulcers which have had to   be repaired surgically.   FINAL DIAGNOSES:       1. Precordial chest pain.      2. Dyspnea.   DISPOSITION:  The initial emergency department evaluation of this patient   appears to be negative for evidence of an acute coronary ischemia requiring   hospital admission or urgent intervention.  However, ischemic coronary   disease has not been eliminated as a consideration.  Consequently, the   patient will be assigned to the emergency department observation Unit for   serial cardiac enzymes and, if these are negative, resting and stress   echocardiography or other additional diagnostic testing.   CONTINUATION BY DR. Barry Dienes:   DIAGNOSTIC STUDIES:  BMP was normal.  CPK was 90, MB 0.6, index 0.7.   Myoglobin 38.9.  BTNP 63.5.  CBC had a white count of 7.6,  hemoglobin 9,   hematocrit 27.8.  Platelets 353.  ED troponin was 0.  Chest x-ray read by   myself showed no acute infiltrates.   Electronically Signed By:   Lawrence Marseilles, M.D. 02/24/2007 11:49   ____________________________   Lawrence Marseilles, M.D.   My signature above authenticates this document and my orders, the final   diagnosis(es), discharge prescription(s) and instructions in the [facility] record.   mw  D:  02/19/2007  T:  02/19/2007  5:07 A   [id number]   ERIN ICENBICE, PA-C

## 2007-02-19 NOTE — Procedures (Signed)
[facility]                          STRESS ECHOCARDIOGRAM REPORT   NAME:   Elaine [name], Elaine [name]                               SS#:   [id number]   DOB:     Feb 27, 1951                                     AGE:        56   SEX:     F                                           ROOM#:     ERO EO05   MR#:    [id number]                                       DATE:   02/19/2007   REFERRING PHYS:   Dr. Barry Dienes                               TAPE/INDEX:   PRIMARY PHYSICIAN:  Dr. Barbie Haggis   PRETEST DATA:   INDICATION:  Chest pain   MEDS TAKEN:  --   MEDS HELD:  Singulair, Protonix, Synthroid, and Zocor.   TARGET HEART RATE:  100% 163     85%:  138   RISK FACTORS:  Hypertension; lipids.   BASELINE ECG:  Normal sinus rhythm; QS V1 and V2   EXERCISE SUPERVISED BY:  --   TEST RESULTS:             BRUCE PROTOCOL     STAGE    SPEED (MPH)   GRADE (%)    TIME (MIN:SEC)     HR       BP   Resting                                                   95     153/70       1          1.7           10            3:00         131     190/90       2          2.5           12            1:00         144       --   Recovery  Immediate                                                         --                                                         --                                                        2:00                                                         92                                                       189/69                                                        4:00                                                         93                                                       153/64                                                        6:00                                                         --                                                         --  REASON FOR STOPPING:  Dyspnea                   TOTAL EXERCISE TIME:  4:00,    1 minute at peak stage 2   ACHIEVED HEART RATE:  144 (88%  max HR)                 EST. METS:  --   HR RESPONSE:  Normal                                    PEAK RPP:  --   BP RESPONSE:  Hypertensive                              CHEST PAIN:  None   OBSERVED DYSRHYTHMIAS:  None   ST SEGMENT CHANGES:  None.   CONCLUSION:  Negative treadmill exercise tolerance test.       WALL MOTION ANALYSIS   LV WALL SEGMENT              PRE-EXERCISE             POST-EXERCISE   Basal Anteroseptal              Normal                 Hyperkinesis   Basal Septal                    Normal                 Hyperkinesis   Basal Inferoseptal              Normal                 Hyperkinesis   Basal Posterior                 Normal                 Hyperkinesis   Basal Lateral                   Normal                 Hyperkinesis   Basal Anterior                  Normal                 Hyperkinesis   Mid Anteroseptal                Normal                 Hyperkinesis   Mid Septal                      Normal                 Hyperkinesis   Mid Inferoseptal                Normal                 Hyperkinesis   Mid Posterior                   Normal                 Hyperkinesis  Mid Lateral                     Normal                 Hyperkinesis   Mid Anterior                    Normal                 Hyperkinesis   Apical Septal                   Normal                 Hyperkinesis   Apical Inferior                 Normal                 Hyperkinesis   Apical Lateral                  Normal                 Hyperkinesis   Apical Anterior                 Normal                 Hyperkinesis   LV  Chamber Size                                         Smaller   ECG INTERPRETATION:  Negative by ECG criteria.   ECHO INTERPRETATION:  Normal wall motion.   OVERALL IMPRESSION:  Low likelihood for coronary artery disease.   ECG AND ECHO INTERPRETATION BY :                                           Norva Pavlov, M.D.    ndt  D: 02/19/2007  T: 02/19/2007  3:14 P    [id number]   cc:    Norva Pavlov, M.D.          Lawrence Marseilles, M.D.

## 2007-02-19 NOTE — Procedures (Signed)
[facility]                          STRESS ECHOCARDIOGRAM REPORT   NAME:   Elaine [name], Elaine [name]                               SS#:   [id number]   DOB:     1951-10-21                                     AGE:        56   SEX:     F                                           ROOM#:     ERO EO05   MR#:    [id number]                                       DATE:   02/19/2007   REFERRING PHYS:   Dr. Barry Dienes                               TAPE/INDEX:   PRIMARY PHYSICIAN:  Dr. Barbie Haggis   PRETEST DATA:   INDICATION:  Chest pain   MEDS TAKEN:  --   MEDS HELD:  Singulair, Protonix, Synthroid, and Zocor.   TARGET HEART RATE:  100% 163     85%:  138   RISK FACTORS:  Hypertension; lipids.   BASELINE ECG:  Normal sinus rhythm; QS V1 and V2   EXERCISE SUPERVISED BY:  --   TEST RESULTS:             BRUCE PROTOCOL     STAGE    SPEED (MPH)   GRADE (%)    TIME (MIN:SEC)     HR       BP   Resting                                                   95     153/70       1          1.7           10            3:00         131     190/90       2          2.5           12            1:00         144       --   Recovery  Immediate                                                         --                                                         --                                                        2:00                                                         92                                                       189/69                                                        4:00                                                         93                                                       153/64                                                        6:00                                                         --                                                         --  REASON FOR STOPPING:  Dyspnea                   TOTAL EXERCISE TIME:  4:00,   1 minute at  peak stage 2   ACHIEVED HEART RATE:  144 (88%  max HR)                 EST. METS:  --   HR RESPONSE:  Normal                                    PEAK RPP:  --   BP RESPONSE:  Hypertensive                              CHEST PAIN:  None   OBSERVED DYSRHYTHMIAS:  None   ST SEGMENT CHANGES:  None.   CONCLUSION:  Negative treadmill exercise tolerance test.       WALL MOTION ANALYSIS   LV WALL SEGMENT              PRE-EXERCISE             POST-EXERCISE   Basal Anteroseptal              Normal                 Hyperkinesis   Basal Septal                    Normal                 Hyperkinesis   Basal Inferoseptal              Normal                 Hyperkinesis   Basal Posterior                 Normal                 Hyperkinesis   Basal Lateral                   Normal                 Hyperkinesis   Basal Anterior                  Normal                 Hyperkinesis   Mid Anteroseptal                Normal                 Hyperkinesis   Mid Septal                      Normal                 Hyperkinesis   Mid Inferoseptal                Normal                 Hyperkinesis   Mid Posterior                   Normal                 Hyperkinesis  Mid Lateral                     Normal                 Hyperkinesis   Mid Anterior                    Normal                 Hyperkinesis   Apical Septal                   Normal                 Hyperkinesis   Apical Inferior                 Normal                 Hyperkinesis   Apical Lateral                  Normal                 Hyperkinesis   Apical Anterior                 Normal                 Hyperkinesis   LV  Chamber Size                                         Smaller   ECG INTERPRETATION:  Negative by ECG criteria.   ECHO INTERPRETATION:  Normal wall motion.   OVERALL IMPRESSION:  Low likelihood for coronary artery disease.   ECG AND ECHO INTERPRETATION BY :                                           Norva Pavlov, M.D.   ndt  D: 02/19/2007  T: 02/19/2007  3:14 P     [id number]   cc:    Norva Pavlov, M.D.          Lawrence Marseilles, M.D.

## 2007-04-22 NOTE — ED Provider Notes (Signed)
OBSERVATION                           [facility]                      EMERGENCY DEPARTMENT TREATMENT REPORT   NAME:  Elaine [name], Elaine [name]            PT. LOCATION:    ERO EO05       DOB:  [birth year]                                                                     AGE:  56   MR #:       BILLING #:           DOS: 04/22/2007  TIME:11:35 P   SEX:  F   21-[birth year]    [id number]   cc:   Primary Physician:   CHIEF COMPLAINT:  Heart racing.   HISTORY OF PRESENT ILLNESS:  This is a 56 year old female who was walking   today during her exercise class at about 2030 when she started to feel   nauseated.  She felt like her heart was racing.  She felt kind of a   heaviness in her chest and she felt like she had some burping and felt like   she needed to throw up.  She states she was sweaty when it occurred.  She   went into the bathroom thinking she was going to throw up, but she states   she just kept burping.  She states all day she has felt a little dizzy and   lightheaded.  She states the heart racing felt like it was beating   regularly and was not missing any beats, but was just beating very fast.   She has had no shortness of breath.  She states her chest discomfort is not   pleuritic.  She has no abdominal pain.  She has had no recent illness and   no fever.  No cough.  She has not traveled anywhere.  No prolonged   immobility.   REVIEW OF SYSTEMS:CONSTITUTIONAL:  No fever, chills, weight loss.   EYES: No visual symptoms.   ENT: No sore throat, runny nose or other URI symptoms.   RESPIRATORY:  No cough, shortness of breath, or wheezing.   CARDIOVASCULAR:  See HPI.   GASTROINTESTINAL:  See HPI.   GENITOURINARY:  No dysuria, frequency, or urgency.   MUSCULOSKELETAL:  Denies any leg pain or swelling.   INTEGUMENTARY:  No rashes.   Denies complaints in any other system.   PAST MEDICAL HISTORY:  She has a history of anemia, hypothyroidism, asthma,   what sounds like peptic ulcer disease.  She states she had a stress test    this year and was told it was okay.   MEDICATIONS:  As reviewed in [facility].   ALLERGIES:  Aspirin and Benadryl.  States she cannot take aspirin due to   the GI bleed she has had before.   SOCIAL HISTORY:  Negative for tobacco or alcohol.  No history of travel.   FAMILY HISTORY:  No known history of coronary disease.   PHYSICAL EXAMINATION:  GENERAL:  This is a well-developed female.   VITAL SIGNS:  Blood pressure 120/77, pulse 106, respirations 19.  O2   saturation is 99% on room air.  HEENT:  Eyes:  Conjunctivae clear, lids   normal.  Pupils equal, symmetrical, and normally reactive.   Sclerae   anicteric.  Mouth/Throat:  Surfaces of the pharynx, palate, and tongue are   pink, moist, and without lesions.   NECK:  Supple, nontender, symmetrical, no masses or JVD, trachea midline.   Thyroid not enlarged, nodular, or tender.   RESPIRATORY:  Clear and equal breath sounds.  No respiratory distress,   tachypnea, or accessory muscle use.   CARDIOVASCULAR:  Heart is regular rate and rhythm.   CHEST:  Nontender.   ABDOMEN:  Soft, nondistended.  Bowel sounds present.  No pain on palpation.   No organomegaly.  No masses.  _____ right lower quadrant.   BACK:  Nontender.   EXTREMITIES:  Warm and dry.  Calves soft and nontender.   SKIN:  Without rash.   NEUROLOGIC:  She is awake, alert, and oriented.   CONTINUATION BY DR. Laural Benes:   See history and physical already dictated by Dr. Mikal Plane.   INITIAL ASSESSMENT AND MANAGEMENT PLAN:  This is a 56 year old with   palpitations, dizziness, lightheadedness, chest discomfort.  This is a new   problem for this patient.  Nursing notes were reviewed.   EMERGENCY DEPARTMENT COURSE:  EKG showed sinus tachycardia at 110 beats per   minute.  No acute ischemia.  The patient was given Zofran 4 mg IV followed   by GI cocktail for relief of her chest discomfort.  CBC, CMP, lipase, and   cardiac enzymes were negative.  Chest x-ray was negative by my read.   FINAL DIAGNOSES:       1. Palpitations.      2. Chest pain, precordial.   DISPOSITION:  To the Observation Unit with CVAL consultation in the a.m.   Electronically Signed By:   Luiz Iron, M.D. 04/23/2007 19:09   ____________________________   Luiz Iron, M.D.   My signature above authenticates this document and my orders, the final   diagnosis(es), discharge prescription(s) and instructions in the [facility]   [facility] record.   MW/mw  D:  04/22/2007  T:  04/23/2007  4:56 A   [id number](pt1)   Fara Chute, PA

## 2007-04-22 NOTE — Discharge Summary (Signed)
[facility]                       OUTPATIENT CENTER DISCHARGE SUMMARY   NAME:  Elaine [name]           PT. LOCATION:      ERO EO05          DOB:                                                                         AGE:   MR #:      BILLING #:           DOA:  04/22/2007   DOD:              SEX:  F   [id number]   [id number]   cc:   Primary Physician:   TIME OF ADMISSION TO ED OBSERVATION:  04/22/2007 at 2300   TIME OF DISCHARGE:  04/23/2007 at 0900   HISTORY OF PRESENT ILLNESS:  A 56 year old female who presented with chest   pain, was seen in the ED.   Cardiac enzymes were normal.  EKG showed no   acute ischemic changes.  Patient was placed in ED observation for   cardiology consult.  The patient had had a prior stress test which was   negative in March.  Patient had repeat cardiac enzymes that were negative   and was seen by cardiology who felt the patient was okay for discharge.  No   further workup at this time.   PHYSICAL EXAMINATION:   Patient feeling fine.  Advised of her negative cardiac enzymes.   VITAL SIGNS:  BP 123/63, pulse 104, respirations 18, temperature 96.6.   Eyes:  Conjunctivae clear, lids normal.  Pupils equal, symmetrical, and   normally reactive.   ________   RESPIRATORY:  Clear and equal breath sounds.  No respiratory distress,   tachypnea, or accessory muscle use.   CARDIOVASCULAR:  Heart regular, without murmurs, gallops, rubs, or thrills.   PMI not displaced.   ________   GI:  Abdomen soft, nontender, without complaint of pain to palpation.  No   hepatomegaly or splenomegaly.   FINAL DIAGNOSES:  Acute precordial pain.   DISPOSITION:  Patient discharged.  She is to follow up with her doctor,   Dr. Barbie Haggis.  Return for new or worsening symptoms.  Patient   evaluated by myself and Dr. Jean Rosenthal who agrees with the above assessment   and plan.   Electronically Signed By:   Acquanetta Chain, M.D. 04/28/2007 09:44   ____________________________    Acquanetta Chain, M.D.   My signature above authenticates this document and my orders, the final   diagnosis(es), discharge prescription(s) and instructions in the [facility]   [facility] record.   ST  D:  04/23/2007  T:  04/26/2007  1:31 P   [id number]   DIANA A HOULE, PA-C

## 2007-04-22 NOTE — Consults (Signed)
[facility]                               CONSULTATION REPORT                   CONSULTANT:  Ola Spurr, M.D.   NAMETyson [name]   BILLING #:  [id number]              DATE OF CONSULT:   MR #:       [id number]               ADM DATE:            04/22/2007   SS #        [id number]            PT. LOCATION:        ERO EO05               DOB:  [birth year]       AGE:  56             SEX:   F   ATTENDING:  Hedda Slade, M.D.   cc:    Reola Mosher, III, M.D.          Hedda Slade, M.D.   Diamantina Monks COPY TO:  Dr. Isabella Bowens   Thank you for allowing me to participate in the evaluation of this   56 year old (DOB 05/31/2051), with the following medical problems.   PROBLEM LIST:      1. Chest tightness, not ischemic in etiology, probably GI related.      2. Recent stress echo on 02/19/07, low risk for significant coronary      disease.      3. Anemia.  Hemoglobin 9.7, hematocrit 31, with an MCV of 74.  Being      evaluated for causes of blood loss.      4. Hypertension.      5. Dyslipidemia.   CHIEF COMPLAINT:  Rapid heartbeat.   HISTORY OF THE PRESENT ILLNESS:  This is a 56 year old who was   participating in an exercise class when she said she began to feel sweaty,   then noted her heart was beating fast, then got nauseous, then felt chest   tightness.   She said she was burping, but was not retching, and did just   feel some chest tightness. She was not short of breath. She's never had   this before. She did not notice any worsening of the discomfort with   exercise. She said she has not had exertional chest discomfort. She's not   had any PND, orthopnea, or ankle edema.  No palpitations, until the evening   of observation, when she had said it felt like her heart was beating fast.   Ironically, she has just recently had an episode in March of 2008, when she   had chest pain that brought her to the hospital with 48 hours of dyspnea,    that led to a stress echo that was low risk for significant coronary   disease, although she did show a rapid cardiac accelerator response,   consistent with deconditioning. She has noted she has felt tired for quite   some time, though admits she is only getting a few hours of sleep a night,   between demands in her life.  She  has not had any clear cut exertional   symptoms of chest discomfort.  Her cardiac risks are those of hypertension   and elevated cholesterol.  She does have diabetes. She does not smoke.   FAMILY HISTORY: Father died in his 27s.  He was murdered.  Mother died of   lung cancer at age 26.   PAST MEDICAL HISTORY: Remarkable as described above.  She has no history of   tuberculosis or rheumatic fever. She has had blood transfusions.  She does   carry a diagnosis of asthma.   She has had some gastroesophageal reflux   disease.   PAST SURGICAL HISTORY:  Hysterectomy, tubal ligation, and several hernia   repairs.   REVIEW OF SYSTEMS:  Remarkable for no fever, chills, or productive cough.   She has had some heartburn.  No symptoms referable to genitourinary system.   No history of strokes, seizures, psychiatric illness. Does have some   arthritis in the back of her knees.   PERSONAL &amp; SOCIAL HISTORY: She has been married for forty years.  Has three   children, five grandchildren.  She does not drink alcohol.  Works as a   Occupational psychologist for [employer], but also is active in [employer]   and school. She feels she is under a lot of stress. She does not drink   alcohol.   PHYSICAL EXAMINATION:   GENERAL: She is a well developed woman, in no acute distress.   VITAL SIGNS:  Blood pressure 123/63, pulse 70, respirations 18, temperature   96.6.   NECK: Without JVD. Carotid pulses are normal.  Upstroke and contour without   bruits.   LUNGS:  No use of accessory muscles for breathing. Clear to auscultation.   HEART:  _____ pulse in the midclavicular line.  Regular rate and rhythm,    without murmurs or extra sounds.   ABDOMEN:  Soft, non-tender.  No hepatosplenomegaly.   EXTREMITIES:  Peripheral pulses are brisk and equal.  No femoral bruits   were noted.   LABORATORY DATA:  Pertinent for a sodium of 142, potassium 3.4, chloride   102, bicarb 28, glucose 104, BUN 10, creatinine 0.9.  Hemoglobin 9.7,   hematocrit 31, MCV 74.  Troponin was normal x2.  CPK of 55 and 39.   EKG shows sinus tachycardia, minor nonspecific ST T changes.   ASSESSMENT:  Chest tightness, not ischemic in etiology.  Probably   multifactorial, including perhaps some rapid heartbeat from her anemia and   the nausea.  From our standpoint, with the recent stress echo, would not   repeat that, but watch clinically.  If she has more symptoms, would   consider a nuclear stress test or possibly cardiac catheterization.  For   now, no further workup.  I should note that she says she is not getting   much sleep, and I wonder if some of her symptoms are worsened by lack of   sleep.  I did recommend she try to get a good eight hours of sleep a night.   Continue her present medications.   MEDICATIONS:      1. Singulair 10 mg daily.      2. Synthroid 0.2 mg daily.      3. K-Dur 20 mEq a daily.      4. Avalide 150/12.5.      5. Crestor 20 mg daily.      6. Prilosec 20 mg daily.      7. Colace.  8. Ferrous sulfate.      9. Advair Diskus as directed.   We are grateful for being allowed to participate in the evaluation of this   patient.   Electronically Signed By:   Donnel Saxon, M.D. 04/24/2007 07:35   _________________________________   Ola Spurr, M.D.   TS1  D:  04/23/2007  T:  04/23/2007 12:34 P   [id number]

## 2007-05-27 NOTE — ED Provider Notes (Signed)
[facility]                      EMERGENCY DEPARTMENT TREATMENT REPORT   NAME:  Elaine [name], Elaine [name]            PT. LOCATION:    ER  ER25       DOB:  [birth year]                                                                     AGE:  56   MR #:       BILLING #:           DOS: 05/27/2007  TIME: 5:10 A   SEX:  F   30-86-57    [id number]   cc:   Primary Physician:   CHIEF COMPLAINT:  Back pain.   HISTORY OF PRESENT ILLNESS:  This is a 56 year old female who arrived to   the emergency department complaining of back pain.  She stated that it   started around 1600.  She states that her upper back and upper abdomen is   hurting.  Cannot stay still.  Has some nausea and vomiting.  No fevers or   chills.  No chest pain.  No shortness of breath.  No pleuritic pain.   REVIEW OF SYSTEMS:   GASTROINTESTINAL: Complaining of abdominal pain and back pain and nausea   and vomiting.  CONSTITUTIONAL:  No fever, chills, weight loss.   EYES: No visual symptoms.   ENT: No sore throat, runny nose or other URI symptoms.   ENDOCRINE:  No diabetic symptoms.   HEMATOLOGIC/LYMPHATIC:  No excessive bruising or lymph node swelling.   ALLERGIC/IMMUNOLOGIC:  No urticaria or allergy symptoms.   RESPIRATORY:  No cough, shortness of breath, or wheezing.   CARDIOVASCULAR:  No chest pain, chest pressure, or palpitations.   GENITOURINARY:  No dysuria, frequency, or urgency.   INTEGUMENTARY:  No rashes.   NEUROLOGICAL:  No headaches, sensory or motor symptoms.   PAST MEDICAL HISTORY:  Hypothyroidism, hyperlipidemia, hypertension,   asthma.   FAMILY/SOCIAL HISTORY:  Noncontributory.   ALLERGIES:  Benadryl, aspirin.   MEDICATIONS:  See Ibex notes.   PHYSICAL EXAMINATION:   VITAL SIGNS:  Blood pressure 165/80, pulse 92, respirations 15, temperature   97.9.  On a 0-10 pain scale, 9/10.  Saturations of 99%.   CONSTITUTIONAL:  The patient appears significantly uncomfortable secondary   to her abdomen.    HEENT:  Head normocephalic, atraumatic.  Eyes:  Conjunctivae clear, lids   normal.  Pupils equal, symmetrical, and normally reactive.   NECK:  Supple, nontender, symmetrical, no masses or JVD, trachea midline.   Thyroid not enlarged, nodular, or tender.   RESPIRATORY:  Clear and equal breath sounds.  No respiratory distress,   tachypnea, or accessory muscle use.   CARDIOVASCULAR:  Heart regular rate and rhythm without any rubs, murmurs,   gallops, or thrills.   GI:  Abdomen soft, nontender, without complaint of pain to palpation.  No   hepatomegaly or splenomegaly.   SKIN:  Warm and dry without rashes.   NEUROLOGIC:  Alert, oriented.  Sensation intact, motor strength equal and   symmetric.   INITIAL ASSESSMENT AND MANAGEMENT PLAN:  We  will go ahead and get a CBC,   CMP, and lipase.  Rule out pancreatitis.  Rule out cholecystitis.  Also due   to the fact she is diabetic and has hypertension and high cholesterol, we   will do cardiac indexes to make sure that this is not her chest.  Will get   acute abdominal series and CT scan of the abdomen and pelvis.  Rule out   kidney stone.  Rule out obstruction.   DIAGNOSTIC STUDIES:  Acute abdominal series was unremarkable as well as the   CT scan was unremarkable.  Please see note.  Cardiac profile, myoglobin,   troponin,  CMP, and lipase were all normal.  Hemoglobin 9.5 and hematocrit   was 30.5.  The patient was given Dilaudid and Zofran and that seemed to   help with the pain.   FINAL DIAGNOSES:      1. Acute abdominal pain evaluation.      2. Thoracic pain.   DISPOSITION/PLAN:  The patient is discharged with verbal and written   instructions and a referral for ongoing care.  The patient is aware that   they may return at any time for new or worsening symptoms.  Condition   stable.  Disposition is home.  Given Vicodin.  Take ibuprofen as well.   Return if there are any further problems.   Electronically Signed By:   Marijo Sanes, M.D. 06/06/2007 08:47    ____________________________   Marijo Sanes, M.D.   My signature above authenticates this document and my orders, the final   diagnosis(es), discharge prescription(s) and instructions in the [facility]   [facility] record.   MW  D:  06/04/2007  T:  06/04/2007  5:58 A   [id number]/   Hilaria Ota, PA-C

## 2008-09-28 LAB — METABOLIC PANEL, COMPREHENSIVE
A-G Ratio: 1.2 (ref 0.8–1.7)
ALT (SGPT): 33 U/L (ref 30–65)
AST (SGOT): 13 U/L — ABNORMAL LOW (ref 15–37)
Albumin: 4.1 g/dL (ref 3.4–5.0)
Alk. phosphatase: 96 U/L (ref 50–136)
Anion gap: 10 mmol/L (ref 5–15)
BUN/Creatinine ratio: 8 — ABNORMAL LOW (ref 12–20)
BUN: 7 MG/DL (ref 7–18)
Bilirubin, total: 0.3 MG/DL (ref 0.1–0.9)
CO2: 30 MMOL/L (ref 21–32)
Calcium: 9.7 MG/DL (ref 8.4–10.4)
Chloride: 101 MMOL/L (ref 100–108)
Creatinine: 0.9 MG/DL (ref 0.6–1.3)
GFR est AA: >60 mL/min/{1.73_m2} (ref >60)
GFR est non-AA: >60 mL/min/{1.73_m2} (ref >60)
Globulin: 3.4 g/dL (ref 2.0–4.0)
Glucose: 111 MG/DL — ABNORMAL HIGH (ref 74–99)
Potassium: 3.7 MMOL/L (ref 3.5–5.5)
Protein, total: 7.5 g/dL (ref 6.4–8.2)
Sodium: 141 MMOL/L (ref 136–145)

## 2008-09-28 LAB — LIPID PANEL
CHOL/HDL Ratio: 3.6 (ref 0–5.0)
Cholesterol, total: 175 MG/DL (ref 0–200)
HDL Cholesterol: 48 MG/DL (ref 40–60)
LDL, calculated: 105 MG/DL — ABNORMAL HIGH (ref 0–100)
LDL/HDL Ratio: 2.2
Triglyceride: 110 MG/DL (ref 0–150)
VLDL, calculated: 22 MG/DL

## 2008-09-28 LAB — TSH 3RD GENERATION: TSH: 0.34 u[IU]/mL — ABNORMAL LOW (ref 0.51–6.27)

## 2008-09-28 LAB — CK: CK: 90 U/L (ref 21–215)

## 2008-09-29 LAB — VITAMIN D, 25 HYDROXY: Vitamin D 25-Hydroxy: 49 ng/mL (ref 30–80)

## 2008-11-09 LAB — CBC WITH AUTOMATED DIFF
ABS. EOSINOPHILS: 0.1 10*3/uL (ref 0.0–0.4)
ABS. LYMPHOCYTES: 1.3 10*3/uL (ref 0.8–3.5)
ABS. MONOCYTES: 0.4 10*3/uL (ref 0–1.0)
ABS. NEUTROPHILS: 4.8 10*3/uL (ref 1.8–8.0)
BASOPHILS: 0 % (ref 0–3)
EOSINOPHILS: 1 % (ref 0–5)
HCT: 32 % — ABNORMAL LOW (ref 36.0–46.0)
HGB: 10.3 g/dL — ABNORMAL LOW (ref 12.0–16.0)
LYMPHOCYTES: 19 % — ABNORMAL LOW (ref 20–51)
MCH: 23.4 PG — ABNORMAL LOW (ref 25.0–35.0)
MCHC: 32.1 g/dL (ref 31.0–37.0)
MCV: 72.8 FL — ABNORMAL LOW (ref 78.0–102.0)
MONOCYTES: 7 % (ref 2–9)
MPV: 9.6 FL (ref 7.4–10.4)
NEUTROPHILS: 73 % (ref 42–75)
PLATELET: 315 10*3/uL (ref 130–400)
RBC: 4.4 M/uL (ref 4.10–5.10)
RDW: 17.7 % — ABNORMAL HIGH (ref 11.5–14.5)
WBC: 6.6 10*3/uL (ref 4.5–13.0)

## 2009-02-08 LAB — LIPID PANEL
CHOL/HDL Ratio: 4.3 (ref 0–5.0)
Cholesterol, total: 282 MG/DL — ABNORMAL HIGH (ref 0–200)
HDL Cholesterol: 65 MG/DL — ABNORMAL HIGH (ref 40–60)
LDL, calculated: 190.6 MG/DL — ABNORMAL HIGH (ref 0–100)
LDL/HDL Ratio: 2.9
Triglyceride: 132 MG/DL (ref 0–150)
VLDL, calculated: 26.4 MG/DL

## 2009-02-08 LAB — CBC WITH AUTOMATED DIFF
ABS. EOSINOPHILS: 0 10*3/uL (ref 0.0–0.4)
ABS. LYMPHOCYTES: 1.4 10*3/uL (ref 0.8–3.5)
ABS. MONOCYTES: 0.5 10*3/uL (ref 0–1.0)
ABS. NEUTROPHILS: 5.7 10*3/uL (ref 1.8–8.0)
BASOPHILS: 1 % (ref 0–3)
EOSINOPHILS: 1 % (ref 0–5)
HCT: 33 % — ABNORMAL LOW (ref 36.0–46.0)
HGB: 10.4 g/dL — ABNORMAL LOW (ref 12.0–16.0)
LYMPHOCYTES: 19 % — ABNORMAL LOW (ref 20–51)
MCH: 24.7 PG — ABNORMAL LOW (ref 25.0–35.0)
MCHC: 31.4 g/dL (ref 31.0–37.0)
MCV: 78.6 FL (ref 78.0–102.0)
MONOCYTES: 6 % (ref 2–9)
MPV: 8.4 FL (ref 7.4–10.4)
NEUTROPHILS: 73 % (ref 42–75)
PLATELET: 329 10*3/uL (ref 130–400)
RBC: 4.2 M/uL (ref 4.10–5.10)
RDW: 17.7 % — ABNORMAL HIGH (ref 11.5–14.5)
WBC: 7.8 10*3/uL (ref 4.5–13.0)

## 2009-02-08 LAB — METABOLIC PANEL, COMPREHENSIVE
A-G Ratio: 1.3 (ref 0.8–1.7)
ALT (SGPT): 31 U/L (ref 30–65)
AST (SGOT): 11 U/L — ABNORMAL LOW (ref 15–37)
Albumin: 4 g/dL (ref 3.4–5.0)
Alk. phosphatase: 65 U/L (ref 50–136)
Anion gap: 9 mmol/L (ref 5–15)
BUN/Creatinine ratio: 19 (ref 12–20)
BUN: 15 MG/DL (ref 7–18)
Bilirubin, total: 0.4 MG/DL (ref 0.1–0.9)
CO2: 29 MMOL/L (ref 21–32)
Calcium: 9.1 MG/DL (ref 8.4–10.4)
Chloride: 105 MMOL/L (ref 100–108)
Creatinine: 0.8 MG/DL (ref 0.6–1.3)
GFR est AA: >60 mL/min/{1.73_m2} (ref >60)
GFR est non-AA: >60 mL/min/{1.73_m2} (ref >60)
Globulin: 3.1 g/dL (ref 2.0–4.0)
Glucose: 102 MG/DL — ABNORMAL HIGH (ref 74–99)
Potassium: 3.8 MMOL/L (ref 3.5–5.5)
Protein, total: 7.1 g/dL (ref 6.4–8.2)
Sodium: 143 MMOL/L (ref 136–145)

## 2009-02-08 LAB — TSH 3RD GENERATION: TSH: 1.88 u[IU]/mL (ref 0.51–6.27)

## 2009-02-08 LAB — CK: CK: 35 U/L (ref 21–215)

## 2009-02-09 LAB — VITAMIN D, 25 HYDROXY: Vitamin D 25-Hydroxy: 34 ng/mL (ref 30–80)

## 2009-02-28 NOTE — Progress Notes (Signed)
3 month follow up, pt complaining of right wrist/hand pain

## 2009-02-28 NOTE — Progress Notes (Signed)
HPI  Elaine [name] is a 58 y.o. female, returns today for f/u with c/o 2 week h/o pain in her right wrist, described as constant with some joint swelling. There has been no change in severity. She presented to [facility] a week ago where Xray showed no abnormalities. She has been taking Tylenol 1-2 tabs po q 4 hrs. prn which helps temporarily. She is requesting Orthopedic consultation.    Review of Systems   Constitutional: Negative for fever and chills.   HENT:        Chronic, followed by Dr.Gurtner   Respiratory: Positive for shortness of breath. Negative for cough.         Chronic  and intermittent D.O.E.    Cardiovascular: Negative for chest pain, palpitations, leg swelling and PND.   Gastrointestinal: Negative for heartburn, nausea, vomiting, abdominal pain, blood in stool and melena.   Musculoskeletal: Negative for myalgias and falls.   Neurological: Positive for headaches. Negative for dizziness, tingling, speech change, focal weakness and loss of consciousness.       Physical Exam   Nursing note and vitals reviewed.  Constitutional: She appears well-developed and well-nourished.   Eyes: Conjunctivae are normal.        No pallor   Neck: Normal range of motion. No JVD present. No thyromegaly present.        No bruit   Cardiovascular: Normal rate, regular rhythm and normal heart sounds.    Pulmonary/Chest: Effort normal and breath sounds normal. No respiratory distress. She has no wheezes.   Abdominal: Soft. Bowel sounds are normal. She exhibits no distension. No tenderness.   Musculoskeletal: Normal range of motion. She exhibits tenderness. She exhibits no edema.        (+) tenderness on medial aspect left wrist. No joint effusion   Neurological: She is alert.       ASSESSMENT and PLAN  1. Pain in Right Wrist (719.43N) - new REFERRAL TO ORTHOPEDIC SURGERY   2. Hypercholesterolemia (272.0J) - poorly controlled,due to poor compliance to Rx    3. Hypothyroidism (244.9AP) - stable     4. HTN (Hypertension), Benign (401.1E) - contriolled    5. GERD (Gastroesophageal Reflux Disease) (530.81S) - stable     Iron defeciency anemia - stable    7. DOE (Dyspnea on Exertion) (786.09CJ) - stable    8. Asthma (493.90AE) - controlled    9. Vitamin D Deficiency (268.9G) - stable    10. 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 Referral to orthopedic surgery   ??? Thiamine (vitamin b-1) 100 mg tablet       Follow-up Disposition:  Return in about 4 months (around 06/30/2009) for f/u with labs.  current treatment plan is effective, no change in therapy.advised patient to take Crestor everyday as prescribed.  lab results and schedule of future lab studies reviewed with patient  reviewed diet, exercise and weight control  cardiovascular risk and specific lipid/LDL goals reviewed  reviewed medications and side effects in detail.    F/u In 4 months with:   272,     Lipid panel,  v58.69,     cpk  401.1,     cmp  268.9,     Serum (OH) D-25.  244.9,    TSH

## 2009-03-13 NOTE — Progress Notes (Signed)
HPI  Elaine [name] is a 58 y.o. African American female, returns today for  Pre-op Exam   .    Review of Systems   Constitutional: Negative for fever and chills.   HENT: Positive for neck pain. Negative for ear pain, congestion and sore throat.         Chronic HA relieved with non-aspirin pain reliever   Eyes: Negative for blurred vision and double vision.   Respiratory: Positive for shortness of breath.         Chronic and intermittent DOE with no asso.CP   Cardiovascular: Negative for chest pain, palpitations, leg swelling and PND.   Gastrointestinal: Negative for nausea, vomiting, abdominal pain, blood in stool and melena.   Musculoskeletal: Positive for back pain. Negative for myalgias.        Chronic pain,neck and back.   Neurological: Positive for headaches. Negative for dizziness, tingling, focal weakness and loss of consciousness.     Past Medical History   ??? Lichen Planus    ??? Sleep Apnea    ??? OA (Osteoarthritis of Spine)    ??? DDD (Degenerative Disc Disease)    ??? Iron Deficiency Anemia    ??? Lichen Planus    ??? OA (Osteoarthritis of Spine)    ??? DDD (Degenerative Disc Disease)      Current outpatient prescriptions were reviewed.     Medication Sig Dispense Refill   ??? thiamine (VITAMIN B-1) 100 mg tablet Take  by mouth daily.       ??? rosuvastatin (CRESTOR) 20 mg tablet Take 20 mg by mouth daily. Indications: HYPERCHOLESTEROLEMIA       ??? olmesartan-hydrochlorothiazide (BENICAR HCT) 20-12.5 mg per tablet Take 1 Tab by mouth daily. Indications: HYPERTENSION       ??? omeprazole (PRILOSEC) 20 mg capsule Take 20 mg by mouth daily. Indications: GASTROESOPHAGEAL REFLUX       ??? budesonide-formoterol (SYMBICORT) 160-4.5 mcg/Actuation HFA inhaler Take 2 Puffs by inhalation. Indications: asthma       ??? Cholecalciferol, Vitamin D3, (VITAMIN D) 2,000 unit Cap Take 1 Tab by mouth daily.       ??? montelukast (SINGULAIR) 10 mg tablet Take 10 mg by mouth nightly as needed. Indications: asthma        ??? albuterol (PROVENTIL, VENTOLIN) 90 mcg/Actuation inhaler Take 2 Puffs by inhalation every six (6) hours as needed.       ??? levothyroxine (SYNTHROID) 175 mcg tablet Take 175 mcg by mouth daily (before breakfast). Indications: HYPOTHYROIDISM         Blood pressure 122/70, pulse 88, temperature 98.4 ??F (36.9 ??C), temperature source Oral, resp. rate 20, height 5\' 1"  (1.549 m), weight 211 lb 8 oz (95.936 kg).    Physical Exam   Constitutional: She appears well-developed and well-nourished. No distress.   Eyes: No scleral icterus.        (+) mild conjunctival pallor.   Neck: No JVD present.   Cardiovascular: Normal rate, regular rhythm and normal heart sounds.    Pulmonary/Chest: Effort normal and breath sounds normal. No respiratory distress.   Abdominal: Soft. Bowel sounds are normal. She exhibits no distension. No tenderness.   Musculoskeletal: She exhibits no edema.   Lymphadenopathy:     She has no cervical adenopathy.   Neurological: She is alert.   Psychiatric: She has a normal mood and affect.       Pre-op Labs,EKG ,CXR : pending      ASSESSMENT and PLAN  1. DOE (dyspnea on  exertion) (786.09CJ) -needs further evaluation REFERRAL TO CARDIOLOGY   2.  \\    3. HTN (hypertension), benign (401.1E) -  well controlled     Cervical Disc Disease with herniation, C4-C5       Awaiting surgical intervention.       Orders Placed This Encounter   ??? Referral to cardiology       current treatment plan is effective, no change in therapy  Review pre-op labs ordered by Dr.Gurtner.

## 2009-03-13 NOTE — Progress Notes (Signed)
Pt here for pre-op for Dr Marca Ancona. 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

## 2009-06-04 MED ORDER — MONTELUKAST 10 MG TAB
10 mg | ORAL_TABLET | Freq: Every evening | ORAL | Status: DC | PRN
Start: 2009-06-04 — End: 2010-05-12

## 2009-06-04 MED ORDER — OLMESARTAN-HYDROCHLOROTHIAZIDE 20 MG-12.5 MG TAB
ORAL_TABLET | Freq: Every day | ORAL | Status: DC
Start: 2009-06-04 — End: 2010-05-12

## 2009-06-28 LAB — LIPID PANEL
CHOL/HDL Ratio: 3.4 (ref 0–5.0)
Cholesterol, total: 181 MG/DL (ref 0–200)
HDL Cholesterol: 53 MG/DL (ref 40–60)
LDL, calculated: 106.8 MG/DL — ABNORMAL HIGH (ref 0–100)
LDL/HDL Ratio: 2
Triglyceride: 106 MG/DL (ref 0–150)
VLDL, calculated: 21.2 MG/DL

## 2009-06-28 LAB — METABOLIC PANEL, COMPREHENSIVE
A-G Ratio: 1.4 (ref 0.8–1.7)
ALT (SGPT): 28 U/L — ABNORMAL LOW (ref 30–65)
AST (SGOT): 14 U/L — ABNORMAL LOW (ref 15–37)
Albumin: 4.4 g/dL (ref 3.4–5.0)
Alk. phosphatase: 74 U/L (ref 50–136)
Anion gap: 13 mmol/L (ref 5–15)
BUN/Creatinine ratio: 10 — ABNORMAL LOW (ref 12–20)
BUN: 10 MG/DL (ref 7–18)
Bilirubin, total: 0.5 MG/DL (ref 0.1–0.9)
CO2: 28 MMOL/L (ref 21–32)
Calcium: 9.9 MG/DL (ref 8.4–10.4)
Chloride: 99 MMOL/L — ABNORMAL LOW (ref 100–108)
Creatinine: 1 MG/DL (ref 0.6–1.3)
GFR est AA: >60 mL/min/{1.73_m2} (ref >60)
GFR est non-AA: >60 mL/min/{1.73_m2} (ref >60)
Globulin: 3.1 g/dL (ref 2.0–4.0)
Glucose: 120 MG/DL — ABNORMAL HIGH (ref 74–99)
Potassium: 3.8 MMOL/L (ref 3.5–5.5)
Protein, total: 7.5 g/dL (ref 6.4–8.2)
Sodium: 140 MMOL/L (ref 136–145)

## 2009-06-28 LAB — TSH 3RD GENERATION: TSH: 8.18 u[IU]/mL — ABNORMAL HIGH (ref 0.51–6.27)

## 2009-06-28 LAB — CK: CK: 44 U/L (ref 21–215)

## 2009-06-29 LAB — VITAMIN D, 25 HYDROXY: Vitamin D 25-Hydroxy: 34 ng/mL (ref 30–80)

## 2009-06-29 NOTE — Progress Notes (Signed)
Quick Note:    Will review with pt. On f/u.  07-04-09  ______

## 2009-07-04 NOTE — Progress Notes (Signed)
Cc: Follow up for Cholesterol Problem, Thyroid Problem, Hypertension, GERD, Anemia, Asthma and Other    HPI  Elaine [name] is a 58 y.o. African American female, returns today for follow up, feeling better since her last OV with no c/o fatigue or D.O.E.      Review of Systems   Respiratory: Negative for cough and shortness of breath.    Cardiovascular: Negative for chest pain, palpitations, leg swelling and PND.   Gastrointestinal: Negative for heartburn, nausea, vomiting and abdominal pain.   Musculoskeletal: Negative for myalgias.        No c/o muscle weakness   Neurological: Negative for dizziness, loss of consciousness and headaches.   Endo/Heme/Allergies: Negative for polydipsia.        No c/o polyuria.       Current outpatient prescriptions reviewed with patient.   ??? ferrous sulfate (IRON) 325 mg (65 mg Iron) tablet Take  by mouth two (2) times a day.       ??? potassium chloride (K-DUR, KLOR-CON) 20 mEq tablet Take 20 mEq by mouth daily.       ??? montelukast (SINGULAIR) 10 mg tablet Take 1 Tab by mouth nightly as needed. Indications: asthma     ??? olmesartan-hydrochlorothiazide (BENICAR HCT) 20-12.5 mg per tablet Take 1 Tab by mouth daily. Indications: HYPERTENSION     ??? thiamine (VITAMIN B-1) 100 mg tablet Take  by mouth daily.       ??? rosuvastatin (CRESTOR) 20 mg tablet Take 20 mg by mouth daily. Indications: HYPERCHOLESTEROLEMIA       ??? omeprazole (PRILOSEC) 20 mg capsule Take 20 mg by mouth daily. Indications: GASTROESOPHAGEAL REFLUX       ??? budesonide-formoterol (SYMBICORT) 160-4.5 mcg/Actuation HFA inhaler Take 2 Puffs by inhalation. Indications: asthma       ??? Cholecalciferol, Vitamin D3, (VITAMIN D) 2,000 unit Cap Take 1 Tab by mouth daily.       ??? albuterol (PROVENTIL, VENTOLIN) 90 mcg/Actuation inhaler Take 2 Puffs by inhalation every six (6) hours as needed.       ??? levothyroxine (SYNTHROID) 175 mcg tablet Take 175 mcg by mouth daily (before breakfast). Indications: HYPOTHYROIDISM            Blood pressure 114/76, pulse 88, resp. rate 12, height 5\' 1"  (1.549 m), weight 193 lb (87.544 kg).  Physical Exam   Vitals reviewed.  Constitutional: She appears well-developed and well-nourished. No distress.   Eyes: No scleral icterus.        No conjunctival pallor   Neck: No JVD present. No thyromegaly present.        Supple, no bruit   Cardiovascular: Normal rate, regular rhythm, normal heart sounds and intact distal pulses.  Exam reveals no gallop.    Pulmonary/Chest: Effort normal and breath sounds normal. No respiratory distress. She has no rales.   Abdominal: Soft. Bowel sounds are normal. She exhibits no distension. No tenderness.   Musculoskeletal: She exhibits no edema.        Normal gait   Lymphadenopathy:     She has no cervical adenopathy.   Neurological: She is alert.   Psychiatric: She has a normal mood and affect.         LABS reviewed with patient.  Component Latest Ref Rng 06/27/2009   SODIUM 136 - 145 MMOL/L 140   POTASSIUM 3.5 - 5.5 MMOL/L 3.8   CHLORIDE 100 - 108 MMOL/L 99 (L)   CO2 21 - 32 MMOL/L 28   ANION GAP  5 - 15 mmol/L 13   GLUCOSE 74 - 99 MG/DL 865 (H)   BUN 7 - 18 MG/DL 10   CREATININE 0.6 - 1.3 MG/DL 1.0   BUN/CREAT RATIO BUCR 12 - 20   10 (L)   GFR EST AA >60 >60   GFR EST NON-AA >60 >60   CALCIUM 8.4 - 10.4 MG/DL 9.9   BILIRUBIN, TOTAL 0.1 - 0.9 MG/DL 0.5   ALT/SGPT 30 - 65 U/L 28 (L)   AST/SGOT 15 - 37 U/L 14 (L)   ALK PHOS 50 - 136 U/L 74   PROTEIN TOTAL 6.4 - 8.2 g/dL 7.5   ALBUMIN 3.4 - 5.0 g/dL 4.4   GLOBULIN GLOB 2.0 - 4.0 g/dL 3.1   A-G RATIO 0.8 - 1.7   1.4   CHOLESTEROL TOTAL CHOL 0 - 200 MG/DL 784   TRIGLYCERIDES TGL 0 - 150 MG/DL 696   HDL CHOLESTEROL HDLC 40 - 60 MG/DL 53   LDL CALCULATED LDLC 0 - 100 MG/DL 295.2 (H)   VLDL CALCULATED VLDL MG/DL 84.1   CHOL/HDL RATIO CHHD 0 - 5.0   3.4   LDL/HDL RATIO LDHD  2.0   CK CPK 21 - 215 U/L 44   VITAMIN D 25-HYDROXY VITD3 30 - 80 ng/mL 34   TSH TSH 0.51 - 6.27 UIU/ML 8.18 (H)       ASSESSMENT and PLAN   1. Hypothyroidism (244.9AP) -inadequately replaced,patient declines increase in dose. TSH, 3RD GENERATION   2. Hypercholesterolemia (272.0J) -  control uncertain, needs improvement, needs to follow diet more regularly  LIPID PANEL   3. HTN (hypertension), benign (401.1E) -  well controlled  METABOLIC PANEL, BASIC   4. Hyperglycemia (790.29N) -needs follow up HEMOGLOBIN A1C, METABOLIC PANEL, BASIC   5. GERD (gastroesophageal reflux disease) (530.81S) -  well controlled     6. Asthma (493.90AE) -  stable     7. Vitamin D deficiency (268.9G) -  improved     8. Iron deficiency anemia (280.9AM) -followed by Dr.Tan    9. Encounter for long-term (current) use of other med (V58.69)  ALT, AST, CK       Orders Placed This Encounter   ??? Lipid panel   ??? Alt   ??? Ast   ??? Hemoglobin a1c   ??? Basic metabolic panel   ??? Tsh   ??? Ck   ??? Potassium chloride (k-dur, klor-con) 20 meq tablet       Follow-up Disposition:  Return in 4 months (on 11/04/2009) for follow up with labs.  current treatment plan is effective, no change in therapy  lab results and schedule of future lab studies reviewed with patient  reviewed diet, exercise and weight control  cardiovascular risk and specific lipid/LDL goals reviewed

## 2009-07-04 NOTE — Progress Notes (Signed)
4 month follow up.

## 2009-08-17 MED ORDER — POTASSIUM CHLORIDE SR 20 MEQ TAB, PARTICLES/CRYSTALS
20 mEq | ORAL_TABLET | ORAL | Status: DC
Start: 2009-08-17 — End: 2010-05-12

## 2009-10-23 LAB — URINALYSIS W/ RFLX MICROSCOPIC
Bilirubin: NEGATIVE
Blood: NEGATIVE
Glucose: NEGATIVE MG/DL
Ketone: NEGATIVE MG/DL
Leukocyte Esterase: NEGATIVE
Nitrites: NEGATIVE
Protein: NEGATIVE MG/DL
Specific gravity: 1.01 (ref 1.003–1.030)
Urobilinogen: 0.2 EU/DL (ref 0.2–1.0)
pH (UA): 7 (ref 5.0–8.0)

## 2009-10-23 LAB — CBC WITH AUTOMATED DIFF
ABS. BASOPHILS: 0 10*3/uL (ref 0.0–0.1)
ABS. EOSINOPHILS: 0 10*3/uL (ref 0.0–0.4)
ABS. LYMPHOCYTES: 0.8 10*3/uL (ref 0.8–3.5)
ABS. MONOCYTES: 0.4 10*3/uL (ref 0–1.0)
ABS. NEUTROPHILS: 5.4 10*3/uL (ref 1.8–8.0)
BASOPHILS: 0 % (ref 0–3)
EOSINOPHILS: 0 % (ref 0–5)
HCT: 31.1 % — ABNORMAL LOW (ref 36.0–46.0)
HGB: 10.2 g/dL — ABNORMAL LOW (ref 12.0–16.0)
LYMPHOCYTES: 12 % — ABNORMAL LOW (ref 20–51)
MCH: 25.4 PG (ref 25.0–35.0)
MCHC: 32.6 g/dL (ref 31.0–37.0)
MCV: 77.9 FL — ABNORMAL LOW (ref 78.0–102.0)
MONOCYTES: 5 % (ref 2–9)
MPV: 8.7 FL (ref 7.4–10.4)
NEUTROPHILS: 83 % — ABNORMAL HIGH (ref 42–75)
PLATELET: 302 10*3/uL (ref 130–400)
RBC: 4 M/uL — ABNORMAL LOW (ref 4.10–5.10)
RDW: 17.4 % — ABNORMAL HIGH (ref 11.5–14.5)
WBC: 6.5 10*3/uL (ref 4.5–13.0)

## 2009-10-23 LAB — METABOLIC PANEL, BASIC
Anion gap: 4 mmol/L — ABNORMAL LOW (ref 5–15)
BUN/Creatinine ratio: 11 — ABNORMAL LOW (ref 12–20)
BUN: 8 MG/DL (ref 7–18)
CO2: 30 MMOL/L (ref 21–32)
Calcium: 9.2 MG/DL (ref 8.4–10.4)
Chloride: 105 MMOL/L (ref 100–108)
Creatinine: 0.7 MG/DL (ref 0.6–1.3)
GFR est AA: >60 mL/min/{1.73_m2} (ref >60)
GFR est non-AA: >60 mL/min/{1.73_m2} (ref >60)
Glucose: 98 MG/DL (ref 74–99)
Potassium: 3.4 MMOL/L — ABNORMAL LOW (ref 3.5–5.5)
Sodium: 139 MMOL/L (ref 136–145)

## 2009-10-24 NOTE — Progress Notes (Signed)
Cc: ER Follow-up     HPI  Elaine [name] is a 58 y.o. African American female, returns today for f/u after having been seen in the ER yesterday for evaluation of increasing pain in her left leg and left hip noted in the past 3 weeks.She had difficulty walking when she awakened yesterday morning with low back ache.   .She received Oxycontin and IV Morphine while in the ER with no relief. She received another shot of pain medication with decrease pain and was dischrge to her home on [address]. She has f/u appointment scheduled with Dr.Gurtner.        Review of Systems   Constitutional: Negative for fever and chills.   HENT: Negative for neck pain.    Neurological: Negative for tingling and focal weakness.         Current outpatient prescriptions reviewed with patient.   ??? oxycodone-acetaminophen (PERCOCET) 5-325 mg per tablet Take 1-2 Tabs by mouth every six (6) hours as needed.       ??? potassium chloride (K-DUR, KLOR-CON) 20 mEq tablet take 1 tablet by mouth once daily     ??? ferrous sulfate (IRON) 325 mg (65 mg Iron) tablet Take  by mouth two (2) times a day.     ??? montelukast (SINGULAIR) 10 mg tablet Take 1 Tab by mouth nightly as needed. Indications: asthma     ??? olmesartan-hydrochlorothiazide (BENICAR HCT) 20-12.5 mg per tablet Take 1 Tab by mouth daily. Indications: HYPERTENSION     ??? thiamine (VITAMIN B-1) 100 mg tablet Take  by mouth daily.       ??? rosuvastatin (CRESTOR) 20 mg tablet Take 20 mg by mouth daily. Indications: HYPERCHOLESTEROLEMIA       ??? omeprazole (PRILOSEC) 20 mg capsule Take 20 mg by mouth daily. Indications: GASTROESOPHAGEAL REFLUX       ??? budesonide-formoterol (SYMBICORT) 160-4.5 mcg/Actuation HFA inhaler Take 2 Puffs by inhalation. Indications: asthma       ??? Cholecalciferol, Vitamin D3, (VITAMIN D) 2,000 unit Cap Take 1 Tab by mouth daily.       ??? albuterol (PROVENTIL, VENTOLIN) 90 mcg/Actuation inhaler Take 2 Puffs by inhalation every six (6) hours as needed.        ??? levothyroxine (SYNTHROID) 175 mcg tablet Take 175 mcg by mouth daily (before breakfast). Indications: HYPOTHYROIDISM           Blood pressure 160/90, pulse 84, temperature 98.8 ??F (37.1 ??C), temperature source Oral, resp. rate 24.  Physical Exam   Vitals reviewed.  Constitutional: She is oriented to person, place, and time.   Neck: Neck supple. No JVD present.   Cardiovascular: Normal rate, regular rhythm and normal heart sounds.    Pulmonary/Chest: Effort normal and breath sounds normal. No respiratory distress.   Musculoskeletal: She exhibits no edema.        Back: mild tenderness over the lumbar spine.Walks with a walker   Neurological: She is alert and oriented to person, place, and time.       ASSESSMENT and PLAN  1. Leg pain, left (729.5FB) - etiology unclear, need to exclude Statin-induced myositis versus radiculopathy CK   2. LBP (low back pain) (724.2AF) - awaiting consultation with Dr.Gurtner    3. HTN (hypertension), benign (401.1E) -  poorly controlled,she did not take her medications this morning           Orders Placed This Encounter   ??? Ck       Follow-up Disposition:  Return if symptoms worsen  or fail to improve.  current treatment plan is effective, no change in therapy  reviewed diet, exercise and weight control

## 2009-10-24 NOTE — Progress Notes (Signed)
Pt was seen in ER because she couldn't walk. Her legs are still going out on her.

## 2009-10-25 LAB — CK: Creatine Kinase,Total: 93 U/L (ref 24–173)

## 2009-10-25 NOTE — Procedures (Unsigned)
[facility]   [facility]   [address] [place], [place] [address]     VASCULAR STUDY      PATIENT: TANEQUA, KRETZ  MRN: [id number] STUDY:  BILLING: [id number] ROOM: OPT  REFERRING: [facility]  DICTATING: [place] Minoru Chap, DO      STUDY DATE: 10/23/2009    STUDY PERFORMED: LOWER EXTREMITY VENOUS DUPLEX    INDICATIONS: Pain in limb      INTERPRETATION:  Left leg :  1. Deep vein(s) visualized include the common femoral, proximal femoral,  mid femoral, distal femoral,  popliteal(above knee), popliteal(fossa), popliteal(below knee), posterior  tibial and peroneal veins.  2. No evidence of deep venous thrombosis detected in the veins visualized.  3. No evidence of deep vein thrombosis in the contralateral/right common  femoral vein.  4. Superficial vein(s) visualized include the great saphenous vein.  5. No evidence of superficial thrombosis detected.  6. Triphasic arterial doppler at ankle.       Electronically Signed: [place] Azel Gumina, D.O.   10/23/2009 08:52 PM        CM:sw  CScript: 10/25/2009 11:48 A  CQDocID #: CScriptDoc #: [id number]    cc: [place] Veronica Guerrant, DO   Myriam Jacobson, MD

## 2009-10-29 NOTE — Telephone Encounter (Signed)
Message copied by Richrd Prime on Tue Oct 29, 2009  5:28 PM  ------       Message from: Kingsley Callander       Created: Tue Oct 29, 2009  5:25 PM       Regarding: Randa Spike       Contact: [phone]         Patient called, LM on voicemail,, needing refill of Synthroid,  Pt needs a written RX

## 2009-10-30 MED ORDER — LEVOTHYROXINE 175 MCG TAB
175 mcg | ORAL_TABLET | Freq: Every day | ORAL | Status: DC
Start: 2009-10-30 — End: 2010-01-06

## 2010-01-03 LAB — METABOLIC PANEL, BASIC
BUN/Creatinine ratio: 13 (ref 9–23)
BUN: 10 mg/dL (ref 6–24)
CO2: 26 mmol/L (ref 20–32)
Calcium: 9.8 mg/dL (ref 8.7–10.2)
Chloride: 99 mmol/L (ref 97–108)
Creatinine: 0.77 mg/dL (ref 0.57–1.00)
GFR est AA: >59 mL/min/{1.73_m2} (ref >59)
GFR est non-AA: >59 mL/min/{1.73_m2} (ref >59)
Glucose: 94 mg/dL (ref 65–99)
Potassium: 3.6 mmol/L (ref 3.5–5.2)
Sodium: 137 mmol/L (ref 135–145)

## 2010-01-03 LAB — LIPID PANEL
Cholesterol, total: 172 mg/dL (ref 100–199)
HDL Cholesterol: 70 mg/dL (ref >39)
LDL, calculated: 88 mg/dL (ref 0–99)
Triglyceride: 72 mg/dL (ref 0–149)
VLDL, calculated: 14 mg/dL (ref 5–40)

## 2010-01-03 LAB — TSH 3RD GENERATION: TSH: 13.58 u[IU]/mL — ABNORMAL HIGH (ref 0.450–4.500)

## 2010-01-03 LAB — HEMOGLOBIN A1C WITH EAG: Hemoglobin A1c: 5.7 % — ABNORMAL HIGH (ref 4.8–5.6)

## 2010-01-03 LAB — ALT: ALT (SGPT): 12 IU/L (ref 0–40)

## 2010-01-03 LAB — CK: Creatine Kinase,Total: 104 U/L (ref 24–173)

## 2010-01-03 LAB — AST: AST (SGOT): 11 IU/L (ref 0–40)

## 2010-01-06 MED ORDER — AMOXICILLIN CLAVULANATE 875 MG-125 MG TAB
875-125 mg | ORAL_TABLET | Freq: Two times a day (BID) | ORAL | Status: AC
Start: 2010-01-06 — End: 2010-01-16

## 2010-01-06 MED ORDER — LEVOTHYROXINE 200 MCG TAB
200 mcg | ORAL_TABLET | Freq: Every day | ORAL | Status: DC
Start: 2010-01-06 — End: 2010-01-16

## 2010-01-06 MED ORDER — LEVOTHYROXINE 200 MCG TAB
200 mcg | ORAL_TABLET | Freq: Every day | ORAL | Status: DC
Start: 2010-01-06 — End: 2010-04-19

## 2010-01-06 NOTE — Progress Notes (Signed)
Cc: Follow up for Thyroid Problem, Cholesterol Problem, Hypertension, Blood sugar problem, GERD, Asthma, Other and Anemia    HPI  Elaine [name] is a 59 y.o. African American female, returns today for f/u with complaints of tender and swollen neck glands noted in the past three days.      Review of Systems   Constitutional: Positive for malaise/fatigue. Negative for fever and chills.        No night sweats.   HENT: Negative for ear pain, congestion and sore throat.         No c/o toothache.   Respiratory: Negative for cough, shortness of breath and wheezing.    Cardiovascular: Negative for chest pain, palpitations, leg swelling and PND.   Gastrointestinal: Negative for heartburn, nausea, vomiting, abdominal pain and blood in stool.   Musculoskeletal: Negative for myalgias and joint pain.        No c/o muscle weakness   Neurological: Negative for dizziness, loss of consciousness and headaches.   Endo/Heme/Allergies: Negative for polydipsia.        No c/o polyuria.         Current outpatient prescriptions reviewed with patient.   ??? levothyroxine (SYNTHROID) 175 mcg tablet Take 1 Tab by mouth daily (before breakfast). Indications: HYPOTHYROIDISM     ??? oxycodone-acetaminophen (PERCOCET) 5-325 mg per tablet Take 1-2 Tabs by mouth every six (6) hours as needed.     ??? potassium chloride (K-DUR, KLOR-CON) 20 mEq tablet take 1 tablet by mouth once daily     ??? ferrous sulfate (IRON) 325 mg (65 mg Iron) tablet Take  by mouth two (2) times a day.     ??? montelukast (SINGULAIR) 10 mg tablet Take 1 Tab by mouth nightly as needed. Indications: asthma     ??? olmesartan-hydrochlorothiazide (BENICAR HCT) 20-12.5 mg per tablet Take 1 Tab by mouth daily. Indications: HYPERTENSION     ??? thiamine (VITAMIN B-1) 100 mg tablet Take  by mouth daily.       ??? rosuvastatin (CRESTOR) 20 mg tablet Take 20 mg by mouth daily. Indications: HYPERCHOLESTEROLEMIA        ??? omeprazole (PRILOSEC) 20 mg capsule Take 20 mg by mouth daily. Indications: GASTROESOPHAGEAL REFLUX       ??? Cholecalciferol, Vitamin D3, (VITAMIN D) 2,000 unit Cap Take 1 Tab by mouth daily.       ??? albuterol (PROVENTIL, VENTOLIN) 90 mcg/Actuation inhaler Take 2 Puffs by inhalation every six (6) hours as needed.           Blood pressure 136/76, pulse 76, resp. rate 12, height 5\' 1"  (1.549 m), weight 202 lb (91.627 kg).  Physical Exam   Vitals reviewed.  Constitutional: She is oriented to person, place, and time. She appears well-developed and well-nourished. No distress.   HENT:   Mouth/Throat: Oropharynx is clear and moist. No oropharyngeal exudate.        No sinus tenderness. TM's are clear.No gingivitis.   Eyes: No scleral icterus.        No conjunctival pallor   Neck: No JVD present. No thyromegaly present.        (+)tender submental lymphadenopathy.   Cardiovascular: Normal rate, regular rhythm and normal heart sounds.  Exam reveals no gallop.    Pulmonary/Chest: Effort normal and breath sounds normal. No respiratory distress. She has no wheezes. She has no rales.   Abdominal: Soft. Bowel sounds are normal. She exhibits no distension. No tenderness.   Musculoskeletal: She exhibits no edema and no  tenderness.        Normal gait   Neurological: She is alert and oriented to person, place, and time.   Skin: Skin is warm and dry.       LAB results on 01/02/10 reviewed with patient.   LIPID PANEL   Component Value Range   ??? Cholesterol, total 172  100 - 199 (mg/dL)   ??? Triglyceride 72  0 - 149 (mg/dL)   ??? HDL Cholesterol 70  >39 (mg/dL)   ??? VLDL, calculated 14  5 - 40 (mg/dL)   ??? LDL, calculated 88  0 - 99 (mg/dL)   ALT   ??? ALT 12  0 - 40 (IU/L)   AST   ??? AST 11  0 - 40 (IU/L)   HEMOGLOBIN A1C   ??? Hemoglobin A1C 5.7 (*) 4.8 - 5.6 (%)   METABOLIC PANEL, BASIC   ??? Glucose 94  65 - 99 (mg/dL)   ??? BUN 10  6 - 24 (mg/dL)   ??? Creatinine 0.77  0.57 - 1.00 (mg/dL)   ??? GFR est non-AA >59  >59 (mL/min/1.73)    ??? GFR est AA >59  >59 (mL/min/1.73)   ??? BUN/Creatinine ratio 13  9 - 23 ( )   ??? Sodium 137  135 - 145 (mmol/L)   ??? Potassium 3.6  3.5 - 5.2 (mmol/L)   ??? Chloride 99  97 - 108 (mmol/L)   ??? CO2 26  20 - 32 (mmol/L)   ??? Calcium 9.8  8.7 - 10.2 (mg/dL)   TSH, 3RD GENERATION   ??? TSH, 3rd generation 13.580 (*) 0.450 - 4.500 (uIU/mL)   CK   ??? Creatine Kinase,Total 104  24 - 173 (U/L)       ASSESSMENT and PLAN  1. Lymphadenopathy (785.6AS) - new amoxicillin-clavulanate (AUGMENTIN) 875-125 mg per tablet   2. Hypothyroidism (244.9AP) - inadequately replaced levothyroxine (SYNTHROID) 200 mcg tablet   3. Hypercholesterolemia (272.0J) -  reasonably well controlled    4. HTN (hypertension), benign (401.1E) -  reasonably well controlled, needs to follow diet more regularly     5. GERD (gastroesophageal reflux disease) (530.81S) -  well controlled     6. Colon polyp (211.3L) - needs follow up REFERRAL TO GASTROENTEROLOGY   7. Asthma (493.90AE) - followed by Dr.Sweeney    8. Vitamin D deficiency (268.9G) on vitamin D3    9. Iron deficiency anemia (280.9AM) - followed by Dr.Tan    10. Need for prophylactic vaccination and inoculation against influenza (V04.81)  INFLUENZA VIRUS VACCINE, SPLIT, IN INDIVIDS. >=3 YRS OF AGE, IM       Orders Placed This Encounter   ??? Influenza virus vaccine, split, in individs. >=3 yrs of age, im   ??? Referral to gastroenterology   ??? Amoxicillin-clavulanate (augmentin) 875-125 mg per tablet   ??? Levothyroxine (synthroid) 200 mcg tablet       Follow-up Disposition:  Return in 1 week (on 01/13/2010), or if symptoms worsen or fail to improve.  current treatment plan is effective with the following changes in treatment are made:   increase Synthroid to 200 mcg daily.  lab results and schedule of future lab studies reviewed with patient  reviewed diet, exercise and weight control  cardiovascular risk and specific lipid/LDL goals reviewed

## 2010-01-06 NOTE — Progress Notes (Signed)
4 month follow up rescheduled from Nov, 2010.  Flu vaccine given in left deltoid with no complications.

## 2010-01-17 MED ORDER — LEVOTHYROXINE 200 MCG TAB
200 mcg | ORAL_TABLET | ORAL | Status: DC
Start: 2010-01-17 — End: 2010-01-23

## 2010-01-23 MED ORDER — AMOXICILLIN CLAVULANATE 875 MG-125 MG TAB
875-125 mg | ORAL_TABLET | Freq: Two times a day (BID) | ORAL | Status: AC
Start: 2010-01-23 — End: 2010-01-27

## 2010-01-23 NOTE — Progress Notes (Signed)
Pt in office today for follow up.

## 2010-01-23 NOTE — Progress Notes (Signed)
Cc: Follow up for submental lymphadenopathy    HPI  Elaine [name] is a 59 y.o. African American female, returns today for follow-up with decreasing size of neck glands which is no longer painful since she started taking Augmentin on Day#10 today. She feels better with no new complaint.   .    Review of Systems   Constitutional: Negative for fever, chills and diaphoresis.   HENT: Negative for ear pain, congestion and sore throat.    Respiratory: Negative for cough, shortness of breath and wheezing.    Cardiovascular: Negative for chest pain.   Neurological: Negative for headaches.       Current outpatient prescriptions reviewed with patient.   ??? amoxicillin-clavulanate (AUGMENTIN) 875-125 mg per tablet Take 1 Tab by mouth two (2) times a day      ??? levothyroxine (SYNTHROID) 200 mcg tablet Take 1 Tab by mouth daily (before breakfast).     ??? oxycodone-acetaminophen (PERCOCET) 5-325 mg per tablet Take 1-2 Tabs by mouth every six (6) hours as needed.     ??? potassium chloride (K-DUR, KLOR-CON) 20 mEq tablet take 1 tablet by mouth once daily     ??? ferrous sulfate (IRON) 325 mg (65 mg Iron) tablet Take  by mouth two (2) times a day.       ??? montelukast (SINGULAIR) 10 mg tablet Take 1 Tab by mouth nightly as needed. Indications: asthma     ??? olmesartan-hydrochlorothiazide (BENICAR HCT) 20-12.5 mg per tablet Take 1 Tab by mouth daily. Indications: HYPERTENSION     ??? thiamine (VITAMIN B-1) 100 mg tablet Take  by mouth daily.       ??? rosuvastatin (CRESTOR) 20 mg tablet Take 20 mg by mouth daily. Indications: HYPERCHOLESTEROLEMIA       ??? omeprazole (PRILOSEC) 20 mg capsule Take 20 mg by mouth daily. Indications: GASTROESOPHAGEAL REFLUX       ??? Cholecalciferol, Vitamin D3, (VITAMIN D) 2,000 unit Cap Take 1 Tab by mouth daily.       ??? albuterol (PROVENTIL, VENTOLIN) 90 mcg/Actuation inhaler Take 2 Puffs by inhalation every six (6) hours as needed.            Blood pressure 126/70, pulse 78, temperature 98.7 ??F (37.1 ??C), resp. rate 12.  Physical Exam   Vitals reviewed.  Constitutional: She is oriented to person, place, and time. No distress.   HENT:   Mouth/Throat: Oropharynx is clear and moist. No oropharyngeal exudate.        TM's are clear.   Neck: Neck supple.        Small residual non-tender submental lymphadenopathy.   Cardiovascular: Normal rate, regular rhythm and normal heart sounds.    Pulmonary/Chest: Effort normal and breath sounds normal. No respiratory distress. She has no wheezes.   Neurological: She is alert and oriented to person, place, and time.       ASSESSMENT and PLAN  1. Submental lymphadenopathy (785.6EJ) -  improved  amoxicillin-clavulanate (AUGMENTIN) 875-125 mg per tablet       Orders Placed This Encounter   ??? Cbc w/ automated diff   ??? Lipid panel   ??? Alt   ??? Ast   ??? Basic metabolic panel   ??? Tsh   ??? Ck   ??? Vitamin d, 25 hydroxy   ??? Amoxicillin-clavulanate (augmentin) 875-125 mg per tablet     Consider ENT consultation if lymphadenopathy persists. Patient will call me after completion of 14-day antibiotic therapy.  Follow-up Disposition:  Return in 3  months (on 04/22/2010) for follow up with labs.  current treatment plan is effective, no change in therapy

## 2010-01-31 NOTE — Telephone Encounter (Addendum)
Message copied by Richrd Prime on Fri Jan 31, 2010  3:42 PM  ------       Message from: Ardine Bjork       Created: Fri Jan 31, 2010  1:53 PM       Regarding: Dr. Randa Spike       Contact: [phone]         Patient finished with antibiotic and glands are still swollen. Can she be referred to an ENT.

## 2010-01-31 NOTE — Telephone Encounter (Signed)
Called patient to see what ENT she wanted to go to, a family member said they would get her to call back.

## 2010-01-31 NOTE — Telephone Encounter (Deleted)
Message copied by Richrd Prime on Fri Jan 31, 2010  3:45 PM  ------       Message from: Ardine Bjork       Created: Fri Jan 31, 2010  1:53 PM       Regarding: Dr. Randa Spike       Contact: [phone]         Patient finished with antibiotic and glands are still swollen. Can she be referred to an ENT.

## 2010-02-04 NOTE — Telephone Encounter (Signed)
Called patient to follow up, left message with her son to return my call.

## 2010-03-30 LAB — SED RATE (ESR): Sed rate (ESR): 7 MM/HR (ref 0–30)

## 2010-04-09 LAB — METABOLIC PANEL, BASIC
BUN/Creatinine ratio: 10 — ABNORMAL LOW (ref 11–26)
BUN: 8 mg/dL (ref 8–27)
CO2: 21 mmol/L (ref 20–32)
Calcium: 9 mg/dL (ref 8.6–10.2)
Chloride: 102 mmol/L (ref 97–108)
Creatinine: 0.82 mg/dL (ref 0.57–1.00)
GFR est AA: 90 mL/min/{1.73_m2} (ref >59)
GFR est non-AA: 78 mL/min/{1.73_m2} (ref >59)
Glucose: 83 mg/dL (ref 65–99)
Potassium: 3.6 mmol/L (ref 3.5–5.2)
Sodium: 145 mmol/L (ref 135–145)

## 2010-04-09 LAB — CBC WITH AUTOMATED DIFF
ABS. BASOPHILS: 0 10*3/uL (ref 0.0–0.2)
ABS. EOSINOPHILS: 0.2 10*3/uL (ref 0.0–0.4)
ABS. IMM. GRANS.: 0 10*3/uL (ref 0.0–0.1)
ABS. LYMPHOCYTES: 1 10*3/uL (ref 0.7–4.5)
ABS. MONOCYTES: 0.4 10*3/uL (ref 0.1–1.0)
ABS. NEUTROPHILS: 4.5 10*3/uL (ref 1.8–7.8)
BASOPHILS: 1 % (ref 0–3)
EOSINOPHILS: 3 % (ref 0–7)
HCT: 31.5 % — ABNORMAL LOW (ref 34.0–44.0)
HGB: 10 g/dL — ABNORMAL LOW (ref 11.5–15.0)
IMMATURE GRANULOCYTES: 0 % (ref 0–1)
LYMPHOCYTES: 16 % (ref 14–46)
MCH: 25.4 pg — ABNORMAL LOW (ref 27.0–34.0)
MCHC: 31.7 g/dL — ABNORMAL LOW (ref 32.0–36.0)
MCV: 80 fL (ref 80–98)
MONOCYTES: 6 % (ref 4–13)
NEUTROPHILS: 74 % (ref 40–74)
PLATELET: 308 10*3/uL (ref 140–415)
RBC: 3.93 x10E6/uL (ref 3.80–5.10)
RDW: 17.4 % — ABNORMAL HIGH (ref 11.7–15.0)
WBC: 6.2 10*3/uL (ref 4.0–10.5)

## 2010-04-09 LAB — CK: Creatine Kinase,Total: 261 U/L — ABNORMAL HIGH (ref 24–173)

## 2010-04-09 LAB — TSH 3RD GENERATION: TSH: 48.06 u[IU]/mL — ABNORMAL HIGH (ref 0.450–4.500)

## 2010-04-09 LAB — LIPID PANEL
Cholesterol, total: 330 mg/dL — ABNORMAL HIGH (ref 100–199)
HDL Cholesterol: 72 mg/dL (ref >39)
LDL, calculated: 210 mg/dL — ABNORMAL HIGH (ref 0–99)
Triglyceride: 239 mg/dL — ABNORMAL HIGH (ref 0–149)
VLDL, calculated: 48 mg/dL — ABNORMAL HIGH (ref 5–40)

## 2010-04-09 LAB — ALT: ALT (SGPT): 20 IU/L (ref 0–40)

## 2010-04-09 LAB — VITAMIN D, 25 HYDROXY: Vitamin D 25-Hydroxy: 13.4 ng/mL — ABNORMAL LOW (ref 32.0–100.0)

## 2010-04-09 LAB — AST: AST (SGOT): 18 IU/L (ref 0–40)

## 2010-04-10 NOTE — Progress Notes (Addendum)
Quick Note:    Will review with pt. On f/u (04-21-10).    ______

## 2010-04-19 MED ORDER — LEVOTHYROXINE 200 MCG TAB
200 mcg | ORAL_TABLET | ORAL | Status: DC
Start: 2010-04-19 — End: 2010-05-12

## 2010-04-21 NOTE — Progress Notes (Signed)
3 month follow up

## 2010-04-21 NOTE — Patient Instructions (Signed)
English   Spanish  High Blood Pressure: After Your Visit  Your Care Instructions  If your blood pressure is usually above 140/90, you have high blood pressure, or hypertension. Despite what a lot of people think, high blood pressure usually does not cause headaches or make you feel dizzy or lightheaded. It usually has no symptoms. But it does increase your risk for heart attack, stroke, and kidney or eye damage. The higher your blood pressure, the more your risk increases.  Changes in your lifestyle, such as staying at a healthy weight, may help you lower your blood pressure. Your treatment also will include medicine. If you stop taking your medicine, your blood pressure will go back up.  Follow-up care is a key part of your treatment and safety. Be sure to make and go to all appointments, and call your doctor if you are having problems. It???s also a good idea to know your test results and keep a list of the medicines you take.  How can you care for yourself at home?  Medical treatment  ?? Take your medicine exactly as prescribed. You may take one or more types of medicine to lower your blood pressure. They include diuretics, beta-blockers, ACE inhibitors, calcium channel blockers, angiotensin II receptor blockers, and other medicines. Call your doctor if you think you are having a problem with your medicine.   ?? Your doctor may suggest that you take one low-dose aspirin (81 mg) a day. This can help reduce your risk of having a stroke or heart attack.   ?? See your doctor at least 2 times a year. You may need to see the doctor more often at first or until your blood pressure comes down.   ?? If you are taking blood pressure medicine, talk to your doctor before you take decongestants or anti-inflammatory medicine, such as ibuprofen. Some of these medicines can raise blood pressure.   ?? Learn how to check your blood pressure at home.  Lifestyle changes   ?? Stay at a healthy weight. This is especially important if you put on weight around the waist. Losing even 10 pounds can help you lower your blood pressure.   ?? If your doctor recommends it, get more exercise. Walking is a good choice. Bit by bit, increase the amount you walk every day. Try for at least 30 minutes on most days of the week. You also may want to swim, bike, or do other activities.   ?? Avoid or limit alcohol. Talk to your doctor about whether you can drink any alcohol.   ?? Limit salt.   ?? Eat plenty of fruits (such as bananas and oranges), vegetables, legumes, whole grains, and low-fat dairy products.   ?? Lower the amount of saturated fat in your diet. Saturated fat is found in animal products such as milk, cheese, and meat. Limiting these foods may help you lose weight and also lower your risk for heart disease.   ?? Do not smoke. Smoking increases your risk for heart attack and stroke. If you need help quitting, talk to your doctor about stop-smoking programs and medicines. These can increase your chances of quitting for good.  When should you call for help?  Call 911 anytime you think you may need emergency care. For example, call if:  ?? You have chest pain or pressure. This may occur with:   ?? Sweating.   ?? Shortness of breath.   ?? Nausea or vomiting.   ?? Pain that spreads from   the chest to the neck, jaw, or one or both shoulders or arms.   ?? Dizziness or lightheadedness.   ?? A fast or uneven pulse.  After calling 911, chew 1 adult-strength aspirin. Wait for an ambulance. Do not try to drive yourself.   ?? You have signs of a stroke. These include:   ?? Sudden numbness, paralysis, or weakness in your face, arm, or leg, especially on only one side of your body.   ?? New problems with walking or balance.   ?? Sudden vision changes.   ?? New problems speaking or understanding simple statements, or feeling confused.   ?? A sudden, severe headache that is different from past headaches.   Call your doctor now or seek immediate medical care if:  ?? Your blood pressure rises suddenly.   ?? Your blood pressure is 180/110 or higher.   ?? You are dizzy or lightheaded, or you feel like you may faint.   ?? Your blood pressure is higher than 140/90 on two or more occasions.  Watch closely for changes in your health, and be sure to contact your doctor if:  ?? You do not get better as expected.    Where can you learn more?   Go to http://www.healthwise.net/[facility]  Enter X567 in the search box to learn more about "High Blood Pressure: After Your Visit."   ?? 1995-2010 Healthwise, Incorporated. Care instructions adapted under license by [employer] (which disclaims liability or warranty for this information). This care instruction is for use with your licensed healthcare professional. If you have questions about a medical condition or this instruction, always ask your healthcare professional. Healthwise disclaims any warranty or liability for your use of this information.  Content Version: 8.8.72453; Last Revised: Apr 13, 2008

## 2010-04-25 NOTE — Telephone Encounter (Signed)
Called patient to discuss recent LAB report; I could not leave a message as her mailbox is full.

## 2010-05-12 MED ORDER — ERGOCALCIFEROL (VITAMIN D2) 50,000 UNIT CAP
1250 mcg (50,000 unit) | ORAL_CAPSULE | ORAL | Status: DC
Start: 2010-05-12 — End: 2010-08-21

## 2010-05-12 MED ORDER — POTASSIUM CHLORIDE SR 20 MEQ TAB, PARTICLES/CRYSTALS
20 mEq | ORAL_TABLET | Freq: Every day | ORAL | Status: DC
Start: 2010-05-12 — End: 2013-03-22

## 2010-05-12 MED ORDER — ROSUVASTATIN 20 MG TAB
20 mg | ORAL_TABLET | Freq: Every day | ORAL | Status: DC
Start: 2010-05-12 — End: 2011-04-25

## 2010-05-12 MED ORDER — OMEPRAZOLE 20 MG CAP, DELAYED RELEASE
20 mg | ORAL_CAPSULE | Freq: Every day | ORAL | Status: DC
Start: 2010-05-12 — End: 2010-12-27

## 2010-05-12 MED ORDER — OLMESARTAN-HYDROCHLOROTHIAZIDE 20 MG-12.5 MG TAB
ORAL_TABLET | Freq: Every day | ORAL | Status: DC
Start: 2010-05-12 — End: 2010-06-06

## 2010-05-12 MED ORDER — MONTELUKAST 10 MG TAB
10 mg | ORAL_TABLET | Freq: Every evening | ORAL | Status: DC | PRN
Start: 2010-05-12 — End: 2013-03-22

## 2010-05-12 MED ORDER — LEVOTHYROXINE 200 MCG TAB
200 mcg | ORAL_TABLET | Freq: Every day | ORAL | Status: DC
Start: 2010-05-12 — End: 2010-12-27

## 2010-05-12 NOTE — Progress Notes (Signed)
Cc: Follow up for Hypertension, Thyroid Problem, Cholesterol Problem, GERD, Vitamin D deficiency, Asthma and Anemia    HPI  Elaine [name] is a 59 y.o. African American female, returns today for follow up and is requesting refill of her medications. She reports of having discontinued taking her medicines about six weeks ago when she developed increasing LBP radiating to her right lower extremity. She sees Dr. Richardson Landry for management of her Lumbar Disc Disease and Sciatica for which she is undergoing Physical Therapy at the present time.     .  Review of Systems   Constitutional: Negative for fever and chills.   Respiratory: Negative for cough, shortness of breath and wheezing.    Cardiovascular: Negative for chest pain, palpitations, leg swelling and PND.   Gastrointestinal: Negative for heartburn, nausea, vomiting, abdominal pain, blood in stool and melena.   Genitourinary: Negative for dysuria, urgency, frequency and hematuria.   Musculoskeletal: Negative for falls.   Neurological: Negative for dizziness, loss of consciousness and headaches.   Endo/Heme/Allergies: Negative for polydipsia.        No c/o polyuria or sx of hypoglycemia.   Psychiatric/Behavioral: Negative for depression. The patient is not nervous/anxious.        Current outpatient prescriptions reviewed with patient.   ??? levothyroxine (SYNTHROID) 200 mcg tablet TAKE 1 TABLET BY MOUTH ONCE DAILY BEFORE BREAKFAST     ??? oxycodone-acetaminophen (PERCOCET) 5-325 mg per tablet Take 1-2 Tabs by mouth every six (6) hours as needed.     ??? potassium chloride (K-DUR, KLOR-CON) 20 mEq tablet take 1 tablet by mouth once daily     ??? ferrous sulfate (IRON) 325 mg (65 mg Iron) tablet Take  by mouth two (2) times a day.     ??? montelukast (SINGULAIR) 10 mg tablet Take 1 Tab by mouth nightly as needed. Indications: asthma     ??? olmesartan-hydrochlorothiazide (BENICAR HCT) 20-12.5 mg per tablet Take 1 Tab by mouth daily. Indications: HYPERTENSION      ??? thiamine (VITAMIN B-1) 100 mg tablet Take  by mouth daily.       ??? rosuvastatin (CRESTOR) 20 mg tablet Take 20 mg by mouth daily. Indications: HYPERCHOLESTEROLEMIA       ??? omeprazole (PRILOSEC) 20 mg capsule Take 20 mg by mouth daily. Indications: GASTROESOPHAGEAL REFLUX       ??? Cholecalciferol, Vitamin D3, (VITAMIN D) 2,000 unit Cap Take 1 Tab by mouth daily.       ??? albuterol (PROVENTIL, VENTOLIN) 90 mcg/Actuation inhaler Take 2 Puffs by inhalation every six (6) hours as needed.           Blood pressure 136/80, pulse 74, resp. rate 12, height 5\' 1"  (1.549 m), weight 207 lb (93.895 kg).  Physical Exam   Vitals reviewed.  Constitutional: She is oriented to person, place, and time. She appears well-developed and well-nourished. No distress.   Eyes: No scleral icterus.        No conjunctival pallor   Neck: No JVD present. No thyromegaly present.        Supple, no bruit   Cardiovascular: Normal rate, regular rhythm and normal heart sounds.  Exam reveals no gallop.    Pulmonary/Chest: Effort normal and breath sounds normal. No respiratory distress. She has no wheezes. She has no rales.   Abdominal: Soft. Bowel sounds are normal. She exhibits no distension. No tenderness.   Musculoskeletal: Normal range of motion. She exhibits no edema and no tenderness.  Normal gait   Neurological: She is alert and oriented to person, place, and time.   Skin: Skin is warm and dry.   Psychiatric: She has a normal mood and affect.       LAB results on 04/08/10 reviewed with patient.   Component Value Range   ??? WBC 6.2  4.0 - 10.5 (x10E3/uL)   ??? RBC 3.93  3.80 - 5.10 (x10E6/uL)   ??? HGB 10.0 (*) 11.5 - 15.0 (g/dL)   ??? HCT 31.5 (*) 34.0 - 44.0 (%)   ??? MCV 80  80 - 98 (fL)   ??? MCH 25.4 (*) 27.0 - 34.0 (pg)   ??? MCHC 31.7 (*) 32.0 - 36.0 (g/dL)   ??? RDW 17.4 (*) 11.7 - 15.0 (%)   ??? PLATELET 308  140 - 415 (x10E3/uL)   ??? NEUTROPHILS 74  40 - 74 (%)   ??? LYMPHOCYTES 16  14 - 46 (%)   ??? MONOCYTES 6  4 - 13 (%)   ??? EOSINOPHILS 3  0 - 7 (%)    ??? BASOPHILS 1  0 - 3 (%)   ??? ABSOLUTE NEUTS 4.5  1.8 - 7.8 (x10E3/uL)   ??? ABSOLUTE LYMPHS 1.0  0.7 - 4.5 (x10E3/uL)   ??? ABSOLUTE MONOS 0.4  0.1 - 1.0 (x10E3/uL)   ??? ABSOLUTE EOSINS 0.2  0.0 - 0.4 (x10E3/uL)   ??? ABSOLUTE BASOS 0.0  0.0 - 0.2 (x10E3/uL)   ??? IMM. GRANS. 0  0 - 1 (%)   ??? ABS. IMM. GRANS. 0.0  0.0 - 0.1 (x10E3/uL)   LIPID PANEL   ??? Cholesterol, total 330 (*) 100 - 199 (mg/dL)   ??? Triglyceride 239 (*) 0 - 149 (mg/dL)   ??? HDL Cholesterol 72  >39 (mg/dL)   ??? VLDL, calculated 48 (*) 5 - 40 (mg/dL)   ??? LDL, calculated 210 (*) 0 - 99 (mg/dL)   ALT   ??? ALT 20  0 - 40 (IU/L)   AST   ??? AST 18  0 - 40 (IU/L)   METABOLIC PANEL, BASIC   ??? Glucose 83  65 - 99 (mg/dL)   ??? BUN 8  8 - 27 (mg/dL)   ??? Creatinine 0.82  0.57 - 1.00 (mg/dL)   ??? GFR est non-AA 78  >59 (mL/min/1.73)   ??? GFR est AA 90  >59 (mL/min/1.73)   ??? BUN/Creatinine ratio 10 (*) 11 - 26    ??? Sodium 145  135 - 145 (mmol/L)   ??? Potassium 3.6  3.5 - 5.2 (mmol/L)   ??? Chloride 102  97 - 108 (mmol/L)   ??? CO2 21  20 - 32 (mmol/L)   ??? Calcium 9.0  8.6 - 10.2 (mg/dL)   TSH, 3RD GENERATION   ??? TSH, 3rd generation 48.060 (*) 0.450 - 4.500 (uIU/mL)   CK   ??? Creatine Kinase,Total 261 (*) 24 - 173 (U/L)   VITAMIN D, 25 HYDROXY   ??? Vitamin D,25-Hydroxy 13.4 (*) 32.0 - 100.0 (ng/mL)         ASSESSMENT and PLAN  1. Hypothyroidism (244.9AP) - inadequately replaced,  patient poorly compliant  levothyroxine (SYNTHROID) 200 mcg tablet, TSH, 3RD GENERATION   2. Hypercholesterolemia (272.0J) -  poorly controlled, patient poorly compliant  rosuvastatin (CRESTOR) 20 mg tablet, LIPID PANEL   3. HTN (hypertension), benign (401.1E) -  stable    olmesartan-hydrochlorothiazide (BENICAR HCT) 20-12.5 mg per tablet, METABOLIC PANEL, BASIC   4. Vitamin D deficiency (268.9G) - inadequately replaced  ergocalciferol (VITAMIN D) 50,000 unit capsule, VITAMIN D, 25 HYDROXY   5. GERD (gastroesophageal reflux disease) (530.81S) -  well controlled  omeprazole (PRILOSEC) 20 mg capsule    6. Iron deficiency anemia (280.9AM) -  Stable, followed by Dr. Jodie Echevaria  CBC WITH AUTOMATED DIFF   7. Asthma (493.90AE) -  stable  montelukast (SINGULAIR) 10 mg tablet   8. Colon polyp (211.3L) - needs follow up. REFERRAL TO GASTROENTEROLOGY   9. Encounter for long-term (current) use of other medications (V58.69)  ALT, AST, CK       Orders Placed This Encounter   ??? Cbc w/ automated diff   ??? Lipid panel   ??? Alt   ??? Ast   ??? Basic metabolic panel   ??? Tsh   ??? Ck   ??? Vitamin d, 25 hydroxy   ??? Referral to gastroenterology   ??? Levothyroxine (synthroid) 200 mcg tablet   ??? Potassium chloride (k-dur, klor-con) 20 meq tablet   ??? Montelukast (singulair) 10 mg tablet   ??? Olmesartan-hydrochlorothiazide (benicar hct) 20-12.5 mg per tablet   ??? Rosuvastatin (crestor) 20 mg tablet   ??? Omeprazole (prilosec) 20 mg capsule   ??? Ergocalciferol (vitamin d) 50,000 unit capsule       Follow-up Disposition:  Return in 3 months (on 08/12/2010) for follow up with LABS.  current treatment plan is effective, no change in therapy  lab results and schedule of future lab studies reviewed with patient  reviewed diet, exercise and weight control  cardiovascular risk and specific lipid/LDL goals reviewed  reviewed medications and side effects in detail

## 2010-05-12 NOTE — Patient Instructions (Signed)
English   Spanish  Learning About Vitamin D  Why is it important to get enough vitamin D?  Your body needs vitamin D to absorb calcium. Calcium keeps your bones and muscles, including your heart, healthy and strong. Your body uses vitamin D to help your muscles absorb calcium and work well. If your muscles don't get enough calcium, then they can cramp, hurt, or feel weak. You may have long-term (chronic) muscle aches and pains.  People who do not get enough vitamin D throughout life have an increased chance of having thin and brittle bones (osteoporosis) in their later years. Children who don't get enough vitamin D may not grow as much as others their age. They also have a chance of getting a rare disease called rickets, which causes weak bones.  Research suggests that low levels of vitamin D also may be linked to a number of other problems such as high blood pressure, cancer, and heart disease.  Your body uses sunshine to make vitamin D. Another way to get vitamin D is from the foods you eat. Vitamin D also is available in supplements.  What is the recommended daily amount of vitamin D?  The amount of vitamin D you need changes as you get older. Most doctors suggest the following:  ?? Children and teens need 400 IU (international units) every day.   ?? People ages 19 to 49 need 400 to 800 IU every day.   ?? People ages 50 and older need 800 to 1,000 IU every day.  What is vitamin D deficiency?  Vitamin D deficiency means that the level of vitamin D in your body is lower than it should be. Most people don't get enough vitamin D.  In the winter, people often spend more time indoors and don't get enough sun. This can reduce how much vitamin D your body makes.  How can you get more vitamin D?  You can get vitamin D by:  ?? Taking a vitamin D pill, vitamin D drops, or a multivitamin pill. Many calcium pills also contain vitamin D.    ?? Eating foods that have vitamin D such as egg yolks, liver, oily fish, and foods and drinks with added (fortified) vitamin D. Most people don't get enough vitamin D through diet alone.   ?? Being out in sunlight a few days a week for 10 to 15 minutes without sunscreen. But experts disagree about whether people should spend even 10 to 15 minutes a day in the sun without sunscreen, because sunscreen helps prevent skin cancer. So you may want to get vitamin D from eating a healthy diet that includes foods fortified with vitamin D and by taking vitamin D pills.  Are there any risks from taking vitamin D?  ?? Getting too much vitamin D can cause problems, just like getting too little can. Talk with your doctor about how much and what sources of vitamin D are right for you and your child.   ?? Too much of any vitamin can make a child sick. Follow your doctor's instructions about using vitamin drops so that you don't give your child too much.   ?? Vitamin D may interact with other medicines. Tell your doctor about all of the medicines you take, including over-the-counter drugs, herbs, and pills. Tell your doctor about all of your current medical problems.    Where can you learn more?   Go to http://www.healthwise.net/[facility]  Enter V530 in the search box to learn more about "Learning About   Vitamin D."   ?? 1995-2010 Healthwise, Incorporated. Care instructions adapted under license by [employer] (which disclaims liability or warranty for this information). This care instruction is for use with your licensed healthcare professional. If you have questions about a medical condition or this instruction, always ask your healthcare professional. Healthwise disclaims any warranty or liability for your use of this information.  Content Version: 8.8.72453; Last Revised: September 18, 2009

## 2010-05-12 NOTE — Progress Notes (Signed)
Patient in office today for 3 month follow up .

## 2010-05-22 NOTE — Telephone Encounter (Signed)
Called patient to let her know that her insurance was denying payment for Benicar HCT, and Dr Randa Spike wanted to know if it is ok to change her to generic losartan HCTZ. Left message for her to return my call.

## 2010-06-06 MED ORDER — LOSARTAN-HYDROCHLOROTHIAZIDE 50 MG-12.5 MG TAB
ORAL_TABLET | Freq: Every day | ORAL | Status: DC
Start: 2010-06-06 — End: 2010-10-31

## 2010-08-19 LAB — VITAMIN D, 25 HYDROXY: Vitamin D 25-Hydroxy: 38.8 ng/mL (ref 32.0–100.0)

## 2010-08-19 LAB — CK: Creatine Kinase,Total: 71 U/L (ref 24–173)

## 2010-08-19 LAB — CBC WITH AUTOMATED DIFF
ABS. BASOPHILS: 0 10*3/uL (ref 0.0–0.2)
ABS. EOSINOPHILS: 0.1 10*3/uL (ref 0.0–0.4)
ABS. IMM. GRANS.: 0 10*3/uL (ref 0.0–0.1)
ABS. LYMPHOCYTES: 0.9 10*3/uL (ref 0.7–4.5)
ABS. MONOCYTES: 0.3 10*3/uL (ref 0.1–1.0)
ABS. NEUTROPHILS: 3.7 10*3/uL (ref 1.8–7.8)
BASOPHILS: 1 % (ref 0–3)
EOSINOPHILS: 1 % (ref 0–7)
HCT: 32.1 % — ABNORMAL LOW (ref 34.0–44.0)
HGB: 9.9 g/dL — ABNORMAL LOW (ref 11.5–15.0)
IMMATURE GRANULOCYTES: 0 % (ref 0–2)
LYMPHOCYTES: 17 % (ref 14–46)
MCH: 24.9 pg — ABNORMAL LOW (ref 27.0–34.0)
MCHC: 30.8 g/dL — ABNORMAL LOW (ref 32.0–36.0)
MCV: 81 fL (ref 80–98)
MONOCYTES: 7 % (ref 4–13)
NEUTROPHILS: 74 % (ref 40–74)
PLATELET: 322 10*3/uL (ref 140–415)
RBC: 3.98 x10E6/uL (ref 3.80–5.10)
RDW: 16.9 % — ABNORMAL HIGH (ref 11.7–15.0)
WBC: 4.9 10*3/uL (ref 4.0–10.5)

## 2010-08-19 LAB — METABOLIC PANEL, BASIC
BUN/Creatinine ratio: 15 (ref 11–26)
BUN: 11 mg/dL (ref 8–27)
CO2: 25 mmol/L (ref 20–32)
Calcium: 9.6 mg/dL (ref 8.6–10.2)
Chloride: 103 mmol/L (ref 97–108)
Creatinine: 0.71 mg/dL (ref 0.57–1.00)
GFR est AA: 107 mL/min/{1.73_m2} (ref >59)
GFR est non-AA: 93 mL/min/{1.73_m2} (ref >59)
Glucose: 97 mg/dL (ref 65–99)
Potassium: 3.9 mmol/L (ref 3.5–5.2)
Sodium: 140 mmol/L (ref 135–145)

## 2010-08-19 LAB — LIPID PANEL
Cholesterol, total: 170 mg/dL (ref 100–199)
HDL Cholesterol: 52 mg/dL (ref >39)
LDL, calculated: 99 mg/dL (ref 0–99)
Triglyceride: 94 mg/dL (ref 0–149)
VLDL, calculated: 19 mg/dL (ref 5–40)

## 2010-08-19 LAB — ALT: ALT (SGPT): 10 IU/L (ref 0–40)

## 2010-08-19 LAB — TSH 3RD GENERATION: TSH: 9.44 u[IU]/mL — ABNORMAL HIGH (ref 0.450–4.500)

## 2010-08-19 LAB — AST: AST (SGOT): 13 IU/L (ref 0–40)

## 2010-08-21 NOTE — Progress Notes (Signed)
Cc: Follow up for Hypertension, Thyroid Problem, Cholesterol Problem, GERD, Vitamin D deficiency, Anemia and Asthma    HPI  Elaine [name] is a 59 y.o. African American female, returns today for follow up and feels so much better since her last OV. She has been taking her medications with no problem.          Current outpatient prescriptions reviewed with patient.   ??? Cholecalciferol, Vitamin D3, (VITAMIN D-3) 5,000 unit Tab Take  by mouth daily.       ??? losartan-hydrochlorothiazide (HYZAAR) 50-12.5 mg per tablet Take 1 Tab by mouth daily.     ??? levothyroxine (SYNTHROID) 200 mcg tablet Take 1 Tab by mouth daily (before breakfast).     ??? potassium chloride (K-DUR, KLOR-CON) 20 mEq tablet Take 1 Tab by mouth daily.     ??? montelukast (SINGULAIR) 10 mg tablet Take 1 Tab by mouth nightly as needed. Indications: asthma     ??? rosuvastatin (CRESTOR) 20 mg tablet Take 1 Tab by mouth daily. Indications: HYPERCHOLESTEROLEMIA     ??? omeprazole (PRILOSEC) 20 mg capsule Take 1 Cap by mouth daily. Indications: GASTROESOPHAGEAL REFLUX     ??? oxycodone-acetaminophen (PERCOCET) 5-325 mg per tablet Take 1-2 Tabs by mouth every six (6) hours as needed.       ??? ferrous sulfate (IRON) 325 mg (65 mg Iron) tablet Take  by mouth two (2) times a day.       ??? thiamine (VITAMIN B-1) 100 mg tablet Take  by mouth daily.       ??? albuterol (PROVENTIL, VENTOLIN) 90 mcg/Actuation inhaler Take 2 Puffs by inhalation every six (6) hours as needed.             Review of Systems   Constitutional: Negative for fever, chills and malaise/fatigue.   Respiratory: Negative for cough, shortness of breath and wheezing.    Cardiovascular: Negative for chest pain, palpitations, leg swelling and PND.   Gastrointestinal: Negative for heartburn, nausea, vomiting, abdominal pain, blood in stool and melena.   Genitourinary: Negative for dysuria and hematuria.   Musculoskeletal: Negative for myalgias.        No c/o muscle weakness    Neurological: Negative for dizziness, loss of consciousness and headaches.         Blood pressure 116/70, pulse 76, resp. rate 12, height 5\' 1"  (1.549 m), weight 196 lb (88.905 kg).  Physical Exam   Vitals reviewed.  Constitutional: She is oriented to person, place, and time. She appears well-developed and well-nourished. No distress.   Eyes: No scleral icterus.        No conjunctival pallor   Neck: No JVD present. No thyromegaly present.        Supple, no bruit   Cardiovascular: Normal rate, regular rhythm and normal heart sounds.  Exam reveals no gallop.    Pulmonary/Chest: Effort normal and breath sounds normal. No respiratory distress. She has no wheezes. She has no rales.   Abdominal: Soft. Bowel sounds are normal. She exhibits no distension. No tenderness.   Musculoskeletal: Normal range of motion. She exhibits no edema and no tenderness.        Normal gait   Lymphadenopathy:     She has no cervical adenopathy.   Neurological: She is alert and oriented to person, place, and time.   Skin: Skin is warm and dry. No rash noted.   Psychiatric: She has a normal mood and affect.         LAB  results on 08/18/10 reviewed with patient.   CBC WITH AUTOMATED DIFF   Component Value Range   ??? WBC 4.9  4.0 - 10.5 (x10E3/uL)   ??? RBC 3.98  3.80 - 5.10 (x10E6/uL)   ??? HGB 9.9 (*) 11.5 - 15.0 (g/dL)   ??? HCT 32.1 (*) 34.0 - 44.0 (%)   ??? MCV 81  80 - 98 (fL)   ??? MCH 24.9 (*) 27.0 - 34.0 (pg)   ??? MCHC 30.8 (*) 32.0 - 36.0 (g/dL)   ??? RDW 16.9 (*) 11.7 - 15.0 (%)   ??? PLATELET 322  140 - 415 (x10E3/uL)   ??? NEUTROPHILS 74  40 - 74 (%)   ??? LYMPHOCYTES 17  14 - 46 (%)   ??? MONOCYTES 7  4 - 13 (%)   ??? EOSINOPHILS 1  0 - 7 (%)   ??? BASOPHILS 1  0 - 3 (%)   ??? ABSOLUTE NEUTS 3.7  1.8 - 7.8 (x10E3/uL)   ??? ABSOLUTE LYMPHS 0.9  0.7 - 4.5 (x10E3/uL)   ??? ABSOLUTE MONOS 0.3  0.1 - 1.0 (x10E3/uL)   ??? ABSOLUTE EOSINS 0.1  0.0 - 0.4 (x10E3/uL)   ??? ABSOLUTE BASOS 0.0  0.0 - 0.2 (x10E3/uL)   ??? IMM. GRANS. 0  0 - 2 (%)    ??? ABS. IMM. GRANS. 0.0  0.0 - 0.1 (x10E3/uL)   LIPID PANEL   ??? Cholesterol, total 170  100 - 199 (mg/dL)   ??? Triglyceride 94  0 - 149 (mg/dL)   ??? HDL Cholesterol 52  >39 (mg/dL)   ??? VLDL, calculated 19  5 - 40 (mg/dL)   ??? LDL, calculated 99  0 - 99 (mg/dL)   ALT   ??? ALT 10  0 - 40 (IU/L)   AST   ??? AST 13  0 - 40 (IU/L)   METABOLIC PANEL, BASIC   ??? Glucose 97  65 - 99 (mg/dL)   ??? BUN 11  8 - 27 (mg/dL)   ??? Creatinine 0.71  0.57 - 1.00 (mg/dL)   ??? GFR est non-AA 93  >59 (mL/min/1.73)   ??? GFR est AA 107  >59 (mL/min/1.73)   ??? BUN/Creatinine ratio 15  11 - 26    ??? Sodium 140  135 - 145 (mmol/L)   ??? Potassium 3.9  3.5 - 5.2 (mmol/L)   ??? Chloride 103  97 - 108 (mmol/L)   ??? CO2 25  20 - 32 (mmol/L)   ??? Calcium 9.6  8.6 - 10.2 (mg/dL)   TSH, 3RD GENERATION   ??? TSH, 3rd generation 9.440 (*) 0.450 - 4.500 (uIU/mL)   CK   ??? Creatine Kinase,Total 71  24 - 173 (U/L)   VITAMIN D, 25 HYDROXY   ??? Vitamin D,25-Hydroxy 38.8  32.0 - 100.0 (ng/mL)         ASSESSMENT and PLAN  1. Hypothyroidism (244.9AP) - inadequately replaced,  patient admits to poor compliance to therapy TSH, 3RD GENERATION   2. Hypercholesterolemia (272.0J) -  well controlled on Crestor LIPID PANEL   3. HTN (hypertension), benign (401.1E) -  well controlled on Hyzaar METABOLIC PANEL, COMPREHENSIVE   4. Iron deficiency anemia (280.9AM) -  Stable on monthly IV Iron therapy by Dr. Jodie Echevaria  CBC WITH AUTOMATED DIFF   5. Asthma (493.90AE) -  well controlled     6. GERD (gastroesophageal reflux disease) (530.81S) -  well controlled on Omeprazole    7. Vitamin D deficiency (268.9G) -  improved  VITAMIN D, 25 HYDROXY   8. Need for prophylactic vaccination and inoculation against influenza (V04.81)  INFLUENZA VIRUS VACCINE, SPLIT, IN INDIVIDS. >=3 YRS OF AGE, IM   9. Encounter for long-term (current) use of other medications (V58.69)  METABOLIC PANEL, COMPREHENSIVE, CK       Orders Placed This Encounter   ??? Influenza virus vaccine, split, in individs. >=3 yrs of age, im    ??? Cbc w/ automated diff   ??? Lipid panel   ??? Comp metabolic panel   ??? Tsh   ??? Ck   ??? Vitamin d, 25 hydroxy       Follow-up Disposition:  Return in 4 months (on 12/21/2010) for follow up with fasting LABS.  current treatment plan is effective, no change in therapy  lab results and schedule of future lab studies reviewed with patient  reviewed diet, exercise and weight control  cardiovascular risk and specific lipid/LDL goals reviewed

## 2010-08-21 NOTE — Patient Instructions (Addendum)
High Cholesterol: After Your Visit  Your Care Instructions  Cholesterol is a type of fat in your blood. It is needed for many body functions, such as making new cells. Cholesterol is made by your body and also comes from food you eat. High cholesterol means you have too much of the fat in your blood.  This care sheet discusses two types of cholesterol: LDL and HDL. LDL is the "bad" cholesterol that builds up inside the blood vessel walls, making them too narrow. This reduces the flow of blood and can cause a heart attack or stroke. HDL is the "good" cholesterol that helps clear bad cholesterol from the body. You want your good cholesterol to be high and your bad cholesterol to be low. If you do this, you can reduce your chance of having a heart attack or a stroke.  You can improve your cholesterol levels by eating less animal and trans fat and more vegetables. Getting regular exercise can also help. But for some people, cholesterol problems run in the family. If changes in diet and exercise do not improve your cholesterol levels, talk to your doctor about using medicine.  Follow-up care is a key part of your treatment and safety. Be sure to make and go to all appointments, and call your doctor if you are having problems. It???s also a good idea to know your test results and keep a list of the medicines you take.  How can you care for yourself at home?  ?? Eat a variety of foods every day. Good choices include fruits, vegetables, whole grains (like oatmeal), dried beans and peas, nuts and seeds, soy products (like tofu), and fat-free or low-fat dairy products.   ?? Replace butter, margarine, and hydrogenated or partially hydrogenated oils with olive and canola oils. (Canola oil margarine without trans fat is fine.)   ?? Replace red meat with fish, poultry, and soy protein (like tofu).   ?? Limit processed and packaged foods like chips, crackers, and cookies.   ?? Bake, broil, or steam foods instead of frying them.    ?? Limit foods high in cholesterol, such as egg yolks.   ?? Be physically active. Exercise increases your HDL, or good cholesterol level. Get at least 30 minutes of exercise on most days of the week. Walking is a good choice. You also may want to do other activities, such as running, swimming, cycling, or playing tennis or team sports.   ?? Stay at a healthy weight or lose weight by making the changes in eating and physical activity presented above. Losing just a small amount of weight, even 5 to 10 pounds, can reduce your risk for having a heart attack or stroke.   ?? Do not smoke. Smoking can increase the chance you will have a heart attack. If you need help quitting, talk to your doctor about stop-smoking programs and medicines. These can increase your chances of quitting for good.   ?? If you are taking medicine for high cholesterol, be sure to take it every day.  When should you call for help?  Call your doctor now or seek immediate medical care if:  ?? You are taking cholesterol medicine and think you have side effects. These may include fatigue, upset stomach, gas, constipation, and pain or cramps in the belly. Report any muscle pain right away.  Watch closely for changes in your health, and be sure to contact your doctor if:  ?? You want help in making diet and exercise changes.   ??   You are worried about your cholesterol level.   ?? You have family members with very high cholesterol levels.      Where can you learn more?   Go to http://www.healthwise.net/[facility]  Enter I865 in the search box to learn more about "High Cholesterol: After Your Visit."    ?? 2006-2011 Health[employer], Inc[employer]. Care instructions adapted under license by [place] (which disclaims liability or warranty for this information). This care instruction is for use with your licensed healthcare professional. If you have questions about a medical condition or this instruction, always ask your healthcare professional. Health[employer], Incorporated disclaims any warranty or liability for your use of this information.  Content Version: 9.0.56994; Last Revised: April 6, 2010Influenza (Flu) Vaccine: After Your Visit  Your Care Instructions  Influenza (flu) is an infection in the lungs and breathing passages. It is caused by the influenza virus. There are different strains, or types, of the flu virus every year. The flu comes on quickly. It can cause a cough, stuffy nose, fever, chills, tiredness, and aches and pains. These symptoms may last up to 10 days. The flu can make you feel very sick, but most of the time it does not lead to other problems. However, it can cause serious problems in people who are older or who have a long-term illness, such as heart disease or diabetes.  You can help prevent the flu by getting a flu vaccine every year. October or November is the best time to get the vaccine. It is given as a shot or in a nasal spray. The viruses in the flu shot are dead, and the nasal spray (FluMist) has weakened live viruses. You cannot get the flu from the shot or the spray. FluMist can be given to healthy people from ages 2 through 49. FluMist is not approved for pregnant women. The vaccine prevents most cases of the flu. But even when the vaccine does not prevent the flu, it can make symptoms less severe and reduce the chance of problems from the flu.   If you know that you or your child is allergic to eggs or to anything else in the vaccine, do not get the flu shot without talking to your doctor. Sometimes, young children and people who have an immune system problem may have a slight fever or muscle aches or pains 6 to 12 hours after getting the shot. These symptoms usually last 1 or 2 days.  Follow-up care is a key part of your treatment and safety. Be sure to make and go to all appointments, and call your doctor if you are having problems. It???s also a good idea to know your test results and keep a list of the medicines you take.  Who should get the flu vaccine?  In general, anyone who wants to lower the chance of getting the flu can have the flu vaccine. But certain people who are at high risk for the flu or for problems from it should have the vaccine every year. They are:  ?? Anyone 50 years of age or older.   ?? People who live in a long-term care center, such as a nursing home.   ?? All children 6 months through 18 years of age.   ?? Adults and children 6 months and older who have long-term heart or lung problems, such as asthma.   ?? Adults and children 6 months and older who needed medical care or were in a hospital during the past year because of diabetes, chronic   kidney disease, or a weak immune system (including HIV or AIDS).   ?? Women who will be pregnant during the flu season.   ?? People who have any condition that can make it hard to breathe or swallow (such as a brain injury or muscle disorders.)   ?? People who can give the flu to others who are at high risk for problems from the flu. This includes all health-care workers and close contacts of people age 65 or older.  Who should not get the flu vaccine?  ?? People who have a severe allergy to chicken eggs.   ?? People who have had a severe reaction to a flu vaccine in the past.   ?? People who developed Guillain-Barr?? syndrome (GBS) within 6 weeks of getting a flu vaccine in the past.    ?? Children younger than 6 months of age.   ?? People who are sick with a fever. (They can get the vaccine when their symptoms are better.)  How can you care for yourself at home?  ?? If you or your child has a sore arm or a slight fever after the shot, take an over-the-counter pain medicine, such as acetaminophen (Tylenol) or ibuprofen (Advil, Motrin). Read and follow all instructions on the label. Do not give aspirin to anyone younger than 20. It has been linked to Reye syndrome, a serious illness.   ?? Do not take two or more pain medicines at the same time unless the doctor told you to. Many pain medicines have acetaminophen, which is Tylenol. Too much acetaminophen (Tylenol) can be harmful.  When should you call for help?  Call 911 anytime you think you may need emergency care. For example, call if after getting the flu vaccine:  ?? Your tongue or throat swells.   ?? You have breathing problems or wheezing.   ?? You passed out (lost consciousness).   ?? You feel like you are having a severe reaction that is like a reaction you had in the past. If you have an allergy kit, give yourself an epinephrine shot. Go to the emergency room, even if you feel better.  Call your doctor now or seek immediate medical care if after getting the flu vaccine:  ?? You are dizzy or lightheaded, or you feel like you may faint.  Watch closely for changes in your health, and be sure to contact your doctor if:  ?? You have a slight fever that increases.   ?? You have muscle aches or pains and feel sick longer than 1 or 2 days.    Where can you learn more?   Go to http://www.healthwise.net/[facility]  Enter N880 in the search box to learn more about "Influenza (Flu) Vaccine: After Your Visit."    ?? 2006-2011 Healthwise, Incorporated. Care instructions adapted under license by [place] (which disclaims liability or warranty for this information). This care instruction is for use with your licensed healthcare professional. If you have questions about a medical condition or this instruction, always ask your healthcare professional. Healthwise, Incorporated disclaims any warranty or liability for your use of this information.  Content Version: 9.0.56994; Last Revised: September 19, 2008

## 2010-08-21 NOTE — Progress Notes (Signed)
Pt requested flu shot. Pt was given flu shot. Tolerated well, no reaction.

## 2010-08-21 NOTE — Progress Notes (Signed)
3 month follow up

## 2010-10-31 MED ORDER — LOSARTAN-HYDROCHLOROTHIAZIDE 50 MG-12.5 MG TAB
ORAL_TABLET | ORAL | Status: DC
Start: 2010-10-31 — End: 2011-11-06

## 2010-12-13 LAB — METABOLIC PANEL, COMPREHENSIVE
A-G Ratio: 1.7 (ref 1.1–2.5)
ALT (SGPT): 12 IU/L (ref 0–40)
AST (SGOT): 12 IU/L (ref 0–40)
Albumin: 4.5 g/dL (ref 3.6–4.8)
Alk. phosphatase: 79 IU/L (ref 25–165)
BUN/Creatinine ratio: 16 (ref 11–26)
BUN: 10 mg/dL (ref 8–27)
Bilirubin, total: 0.5 mg/dL (ref 0.0–1.2)
CO2: 24 mmol/L (ref 20–32)
Calcium: 9.9 mg/dL (ref 8.6–10.2)
Chloride: 101 mmol/L (ref 97–108)
Creatinine: 0.63 mg/dL (ref 0.57–1.00)
GFR est AA: 113 mL/min/{1.73_m2} (ref >59)
GFR est non-AA: 98 mL/min/{1.73_m2} (ref >59)
GLOBULIN, TOTAL: 2.7 g/dL (ref 1.5–4.5)
Glucose: 109 mg/dL — ABNORMAL HIGH (ref 65–99)
Potassium: 3.8 mmol/L (ref 3.5–5.2)
Protein, total: 7.2 g/dL (ref 6.0–8.5)
Sodium: 142 mmol/L (ref 134–144)

## 2010-12-13 LAB — CBC WITH AUTOMATED DIFF
ABS. BASOPHILS: 0 10*3/uL (ref 0.0–0.2)
ABS. EOSINOPHILS: 0.1 10*3/uL (ref 0.0–0.4)
ABS. IMM. GRANS.: 0 10*3/uL (ref 0.0–0.1)
ABS. LYMPHOCYTES: 1.1 10*3/uL (ref 0.7–4.5)
ABS. MONOCYTES: 0.4 10*3/uL (ref 0.1–1.0)
ABS. NEUTROPHILS: 2.8 10*3/uL (ref 1.8–7.8)
BASOPHILS: 1 % (ref 0–3)
EOSINOPHILS: 2 % (ref 0–7)
HCT: 35.1 % (ref 34.0–44.0)
HGB: 11 g/dL — ABNORMAL LOW (ref 11.5–15.0)
IMMATURE GRANULOCYTES: 0 % (ref 0–2)
LYMPHOCYTES: 25 % (ref 14–46)
MCH: 22.8 pg — ABNORMAL LOW (ref 27.0–34.0)
MCHC: 31.3 g/dL — ABNORMAL LOW (ref 32.0–36.0)
MCV: 73 fL — ABNORMAL LOW (ref 80–98)
MONOCYTES: 8 % (ref 4–13)
NEUTROPHILS: 64 % (ref 40–74)
PLATELET: 283 10*3/uL (ref 140–415)
RBC: 4.83 x10E6/uL (ref 3.80–5.10)
RDW: 17.5 % — ABNORMAL HIGH (ref 11.7–15.0)
WBC: 4.3 10*3/uL (ref 4.0–10.5)

## 2010-12-13 LAB — LIPID PANEL
Cholesterol, total: 173 mg/dL (ref 100–199)
HDL Cholesterol: 51 mg/dL (ref >39)
LDL, calculated: 102 mg/dL — ABNORMAL HIGH (ref 0–99)
Triglyceride: 102 mg/dL (ref 0–149)
VLDL, calculated: 20 mg/dL (ref 5–40)

## 2010-12-13 LAB — TSH 3RD GENERATION: TSH: 0.353 u[IU]/mL — ABNORMAL LOW (ref 0.450–4.500)

## 2010-12-13 LAB — VITAMIN D, 25 HYDROXY: VITAMIN D, 25-HYDROXY: 48.1 ng/mL (ref 30.0–100.0)

## 2010-12-13 LAB — CK: Creatine Kinase,Total: 54 U/L (ref 24–173)

## 2010-12-13 NOTE — Progress Notes (Signed)
Quick Note:    Will review with pt. On f/u (12-18-2010).    ______

## 2010-12-24 ENCOUNTER — Ambulatory Visit: Primary: Internal Medicine

## 2010-12-24 DIAGNOSIS — E039 Hypothyroidism, unspecified: Principal | ICD-10-CM

## 2010-12-24 NOTE — Progress Notes (Signed)
4 month follow up

## 2010-12-24 NOTE — Patient Instructions (Signed)
High Cholesterol: After Your Visit  Your Care Instructions  Cholesterol is a type of fat in your blood. It is needed for many body functions, such as making new cells. Cholesterol is made by your body and also comes from food you eat. High cholesterol means that you have too much of the fat in your blood.  LDL and HDL are part of your total cholesterol. LDL is the "bad" cholesterol that builds up inside the blood vessel walls, making them too narrow. This reduces the flow of blood and can cause a heart attack or stroke. HDL is the "good" cholesterol that helps clear bad cholesterol from the body. You want your good cholesterol to be high and your bad cholesterol to be low. If you do this, you can reduce your chance of having a heart attack or a stroke.  You can improve your cholesterol levels by eating less animal and trans fat and more vegetables. Getting regular exercise can also help. But for some people, cholesterol problems run in the family. If changes in diet and exercise don't improve your cholesterol levels, talk to your doctor about using medicine.  Follow-up care is a key part of your treatment and safety. Be sure to make and go to all appointments, and call your doctor if you are having problems. It???s also a good idea to know your test results and keep a list of the medicines you take.  How can you care for yourself at home?  ?? Eat a variety of foods every day. Good choices include fruits, vegetables, whole grains (like oatmeal), dried beans and peas, nuts and seeds, soy products (like tofu), and fat-free or low-fat dairy products.  ?? Replace butter, margarine, and hydrogenated or partially hydrogenated oils with olive and canola oils. (Canola oil margarine without trans fat is fine.)  ?? Replace red meat with fish, poultry, and soy protein (like tofu).  ?? Limit processed and packaged foods like chips, crackers, and cookies.  ?? Bake, broil, or steam foods instead of frying them.   ?? Limit foods high in cholesterol, such as egg yolks.  ?? Be physically active. Exercise increases your HDL, or good cholesterol level. Get at least 30 minutes of exercise on most days of the week. Walking is a good choice. You also may want to do other activities, such as running, swimming, cycling, or playing tennis or team sports.  ?? Stay at a healthy weight or lose weight by making the changes in eating and physical activity presented above. Losing just a small amount of weight, even 5 to 10 pounds, can reduce your risk for having a heart attack or stroke.  When should you call for help?  Watch closely for changes in your health, and be sure to contact your doctor if:  ?? You need more help controlling your cholesterol.      Where can you learn more?    Go to http://www.healthwise.net/[facility]  Enter I865 in the search box to learn more about "High Cholesterol: After Your Visit."    ?? 2006-2011 Healthwise, Inc[employer]. Care instructions adapted under license by [place] (which disclaims liability or warranty for this information). This care instruction is for use with your licensed healthcare professional. If you have questions about a medical condition or this instruction, always ask your healthcare professional. [employer], Inc[employer] disclaims any warranty or liability for your use of this information.  Content Version: 9.1.125182; Last Revised: March 12, 2009

## 2010-12-27 MED ORDER — LEVOTHYROXINE 175 MCG TAB
175 mcg | ORAL_TABLET | Freq: Every day | ORAL | Status: DC
Start: 2010-12-27 — End: 2011-11-06

## 2010-12-27 NOTE — Progress Notes (Signed)
Cc: Follow up for Thyroid Problem, Cholesterol Problem, Hypertension, Anemia, Asthma, GERD and Vitamin D deficiency    HPI  Elaine [name] is a 60 y.o. African American female, returns today for follow up with no specific complaint. Since her last office visit she has been feeling well taking her medications with no problem. [facility] to [facility] of her chronic low back pain.   .      Patient Active Problem List   ??? Fatigue 780.79B   ??? DOE (Dyspnea on Exertion) 786.09CJ   ??? Hypothyroidism 244.9AP   ??? Hypercholesterolemia 272.0J   ??? HTN (Hypertension), Benign 401.1E   ??? GERD (Gastroesophageal Reflux Disease) 530.81S   ??? Asthma 493.90AE   ??? Vitamin D Deficiency 268.9G   ??? Iron Deficiency Anemia 280.9AM   ??? Hyperglycemia 790.29N   ??? Submental lymphadenopathy 785.6EJ   ??? Sciatica 724.3         Current outpatient prescriptions   Medication Sig Dispense Refill   ??? losartan-hydrochlorothiazide (HYZAAR) 50-12.5 mg per tablet take 1 tablet by mouth once daily     ??? Cholecalciferol, Vitamin D3, (VITAMIN D-3) 5,000 unit Tab Take  by mouth daily.     ??? levothyroxine (SYNTHROID) 200 mcg tablet Take 1 Tab by mouth daily (before breakfast).     ??? potassium chloride (K-DUR, KLOR-CON) 20 mEq tablet Take 1 Tab by mouth daily.     ??? montelukast (SINGULAIR) 10 mg tablet Take 1 Tab by mouth nightly as needed. Indications: asthma     ??? rosuvastatin (CRESTOR) 20 mg tablet Take 1 Tab by mouth daily. Indications: HYPERCHOLESTEROLEMIA     ??? thiamine (VITAMIN B-1) 100 mg tablet Take  by mouth daily.       ??? albuterol (PROVENTIL, VENTOLIN) 90 mcg/Actuation inhaler Take 2 Puffs by inhalation every six (6) hours as needed.             Review of Systems   Constitutional: Negative for fever, chills and weight loss.   HENT: Negative for ear pain, nosebleeds and congestion.          (+)chronic intermittent headache relieved with Tylenol. (+)she developed sore throat in the past few days relieved with over-the-counter medication.   Respiratory: Negative for cough and shortness of breath.    Cardiovascular: Negative for chest pain, palpitations, leg swelling and PND.   Gastrointestinal: Negative for heartburn, nausea, vomiting, abdominal pain, blood in stool and melena.   Genitourinary: Negative for dysuria and hematuria.   Musculoskeletal: Negative for myalgias.        No c/o muscle weakness   Neurological: Negative for dizziness and loss of consciousness.   Endo/Heme/Allergies: Does not bruise/bleed easily.         Blood pressure 110/64, pulse 84, temperature 98.8 ??F (37.1 ??C), temperature source Oral, resp. rate 12, height 5\' 1"  (1.549 m), weight 196 lb (88.905 kg).  Physical Exam   Vitals reviewed.  Constitutional: She is oriented to person, place, and time. She appears well-developed and well-nourished. No distress.   HENT:   Head: Normocephalic and atraumatic.   Mouth/Throat: Oropharynx is clear and moist. No oropharyngeal exudate.        No sinus tenderness   Eyes: EOM are normal. Pupils are equal, round, and reactive to light. No scleral icterus.        No conjunctival pallor   Neck: No JVD present. No thyromegaly present.        Supple, no  bruit   Cardiovascular: Normal rate, regular rhythm and normal heart sounds.  Exam reveals no gallop.    Pulmonary/Chest: Effort normal and breath sounds normal. No respiratory distress. She has no rales.   Abdominal: Soft. Bowel sounds are normal. She exhibits no distension. No tenderness.   Musculoskeletal: Normal range of motion. She exhibits no edema and no tenderness.        Normal gait   Lymphadenopathy:     She has no cervical adenopathy.   Neurological: She is alert and oriented to person, place, and time.   Skin: Skin is warm and dry. No rash noted.       Results for orders placed in visit on 12/12/10   CBC WITH AUTOMATED DIFF    Component Value Range   ??? WBC 4.3  4.0 - 10.5 (x10E3/uL)   ??? RBC 4.83  3.80 - 5.10 (x10E6/uL)   ??? HGB 11.0 (*) 11.5 - 15.0 (g/dL)   ??? HCT 35.1  34.0 - 44.0 (%)   ??? MCV 73 (*) 80 - 98 (fL)   ??? MCH 22.8 (*) 27.0 - 34.0 (pg)   ??? MCHC 31.3 (*) 32.0 - 36.0 (g/dL)   ??? RDW 17.5 (*) 11.7 - 15.0 (%)   ??? PLATELET 283  140 - 415 (x10E3/uL)   ??? NEUTROPHILS 64  40 - 74 (%)   ??? LYMPHOCYTES 25  14 - 46 (%)   ??? MONOCYTES 8  4 - 13 (%)   ??? EOSINOPHILS 2  0 - 7 (%)   ??? BASOPHILS 1  0 - 3 (%)   ??? ABSOLUTE NEUTS 2.8  1.8 - 7.8 (x10E3/uL)   ??? ABSOLUTE LYMPHS 1.1  0.7 - 4.5 (x10E3/uL)   ??? ABSOLUTE MONOS 0.4  0.1 - 1.0 (x10E3/uL)   ??? ABSOLUTE EOSINS 0.1  0.0 - 0.4 (x10E3/uL)   ??? ABSOLUTE BASOS 0.0  0.0 - 0.2 (x10E3/uL)   ??? IMM. GRANS. 0  0 - 2 (%)   ??? ABS. IMM. GRANS. 0.0  0.0 - 0.1 (x10E3/uL)   LIPID PANEL   ??? Cholesterol, total 173  100 - 199 (mg/dL)   ??? Triglyceride 102  0 - 149 (mg/dL)   ??? HDL Cholesterol 51  >39 (mg/dL)   ??? VLDL, calculated 20  5 - 40 (mg/dL)   ??? LDL, calculated 102 (*) 0 - 99 (mg/dL)   METABOLIC PANEL, COMPREHENSIVE   ??? Glucose 109 (*) 65 - 99 (mg/dL)   ??? BUN 10  8 - 27 (mg/dL)   ??? Creatinine 0.63  0.57 - 1.00 (mg/dL)   ??? GFR est non-AA 98  >59 (mL/min/1.73)   ??? GFR est AA 113  >59 (mL/min/1.73)   ??? BUN/Creatinine ratio 16  11 - 26    ??? Sodium 142  134 - 144 (mmol/L)   ??? Potassium 3.8  3.5 - 5.2 (mmol/L)   ??? Chloride 101  97 - 108 (mmol/L)   ??? CO2 24  20 - 32 (mmol/L)   ??? Calcium 9.9  8.6 - 10.2 (mg/dL)   ??? Protein, total 7.2  6.0 - 8.5 (g/dL)   ??? Albumin 4.5  3.6 - 4.8 (g/dL)   ??? GLOBULIN, TOTAL 2.7  1.5 - 4.5 (g/dL)   ??? A-G Ratio 1.7  1.1 - 2.5    ??? Bilirubin, total 0.5  0.0 - 1.2 (mg/dL)   ??? Alk. phosphatase 79  25 - 165 (IU/L)   ??? AST 12  0 - 40 (IU/L)   ???  ALT 12  0 - 40 (IU/L)   TSH, 3RD GENERATION   ??? TSH, 3rd generation 0.353 (*) 0.450 - 4.500 (uIU/mL)   CK   ??? Creatine Kinase,Total 54  24 - 173 (U/L)   VITAMIN D, 25 HYDROXY   ??? VITAMIN D, 25-HYDROXY 48.1  30.0 - 100.0 (ng/mL)         ASSESSMENT and PLAN   1. Hypothyroidism (244.9AP) - on excessive thyroid hormone replacement TSH, 3RD GENERATION   2. HTN (hypertension), benign (401.1E) - well-controlled on Hyzaar METABOLIC PANEL, COMPREHENSIVE   3. Hypercholesterolemia (272.0J) - improved on Crestor LIPID PANEL   4. Hyperglycemia (790.29N) - needs follow up HEMOGLOBIN A1C   5. Iron deficiency anemia (280.9AM) - improved    6. Asthma (493.90AE) - stable    7. GERD (gastroesophageal reflux disease) (530.81S) - well-controlled    8. Vitamin D deficiency (268.9G) - improved VITAMIN D, 25 HYDROXY   9. Encounter for long-term (current) use of other medications (V58.69)  CK       Orders Placed This Encounter   ??? LIPID PANEL   ??? HEMOGLOBIN A1C   ??? COMP METABOLIC PANEL   ??? TSH   ??? CK   ??? VITAMIN D, 25 HYDROXY   ??? levothyroxine (SYNTHROID) 175 mcg tablet       Follow-up Disposition:  Return in 4 months (on 04/24/2011) for follow up with fasting LABS.  current treatment plan is effective with the following changes in treatment are made:   decrease Synthroid to 175 mcg daily  lab results and schedule of future lab studies reviewed with patient  reviewed diet, exercise and weight control  Encouraged patient to pursue the Weight Watchers program which she plans to participate on 01-07-11   cardiovascular risk and specific lipid/LDL goals reviewed

## 2011-03-02 NOTE — Telephone Encounter (Signed)
Patient misplaced prescription for mammogram.  Wondering if there can be a new prescription for this.

## 2011-03-02 NOTE — Telephone Encounter (Signed)
Mammogram ordered

## 2011-03-02 NOTE — Telephone Encounter (Signed)
Called pt regarding mammo order. Only order I see in chart is from 2010. Asked pt to return call regarding mammo orders for clarification.

## 2011-03-03 NOTE — Telephone Encounter (Signed)
Pt notified.

## 2011-04-14 NOTE — Telephone Encounter (Signed)
Patient notified to pick up prescription.

## 2011-04-25 MED ORDER — CRESTOR 20 MG TABLET
20 mg | ORAL_TABLET | ORAL | Status: DC
Start: 2011-04-25 — End: 2012-07-03

## 2011-05-20 NOTE — Progress Notes (Signed)
4 month follow up

## 2011-05-20 NOTE — Progress Notes (Signed)
Cc: Follow up for Thyroid Problem, Hypertension, Cholesterol Problem, Asthma and GERD     HPI  Elaine [name] is a 60 y.o. African American female, returns today for follow up after having decreased the dose of Synthroid which she is tolerating well with no problem. Since her last office visit she has been feeling well although she continues to see Dr. Richardson Landry for management of chronic low back pain.   .      Patient Active Problem List   ??? Fatigue 780.79   ??? DOE (Dyspnea on Exertion) 786.09   ??? Hypothyroidism 244.9   ??? Hypercholesterolemia 272.0   ??? HTN (Hypertension), Benign 401.1   ??? GERD (Gastroesophageal Reflux Disease) 530.81   ??? Asthma 493.90   ??? Vitamin D Deficiency 268.9   ??? Iron Deficiency Anemia 280.9   ??? , 784.0   ??? Hyperglycemia 790.29   ??? Submental lymphadenopathy 785.6   ??? Sciatica 724.3         Current Outpatient Prescriptions   ??? CRESTOR 20 mg tablet take 1 tablet by mouth once daily     ??? levothyroxine (SYNTHROID) 175 mcg tablet Take 1 Tab by mouth Daily (before breakfast).     ??? losartan-hydrochlorothiazide (HYZAAR) 50-12.5 mg per tablet take 1 tablet by mouth once daily     ??? Cholecalciferol, Vitamin D3, (VITAMIN D-3) 5,000 unit Tab Take  by mouth daily.     ??? potassium chloride (K-DUR, KLOR-CON) 20 mEq tablet Take 1 Tab by mouth daily.     ??? montelukast (SINGULAIR) 10 mg tablet Take 1 Tab by mouth nightly as needed. Indications: asthma     ??? thiamine (VITAMIN B-1) 100 mg tablet Take  by mouth daily.       ??? albuterol (PROVENTIL, VENTOLIN) 90 mcg/Actuation inhaler Take 2 Puffs by inhalation every six (6) hours as needed.             Review of Systems   Constitutional: Negative for fever, chills and weight loss.   HENT: Negative for ear pain, nosebleeds and congestion.    Respiratory: Negative for cough and shortness of breath.         (+)intermittent cough productive of white expectorate related to allergy   Cardiovascular: Negative for chest pain, palpitations, leg swelling and PND.    Gastrointestinal: Negative for heartburn, nausea, vomiting, abdominal pain, blood in stool and melena.   Genitourinary: Negative for dysuria and hematuria.   Musculoskeletal: Negative for myalgias.        No c/o muscle weakness   Neurological: Negative for dizziness, loss of consciousness and headaches.   Endo/Heme/Allergies: Does not bruise/bleed easily.         Blood pressure 134/70, pulse 84, resp. rate 12, height 5\' 1"  (1.549 m), weight 193 lb (87.544 kg).  Physical Exam   Vitals reviewed.  Constitutional: She is oriented to person, place, and time. She appears well-developed and well-nourished. No distress.   Eyes: Conjunctivae and EOM are normal. Pupils are equal, round, and reactive to light. No scleral icterus.   Neck: Neck supple. No JVD present. Carotid bruit is not present. No thyromegaly present.   Cardiovascular: Normal rate, regular rhythm and normal heart sounds.  Exam reveals no gallop.    Pulmonary/Chest: Effort normal and breath sounds normal. No respiratory distress. She has no rales.   Abdominal: Soft. Bowel sounds are normal. She exhibits no distension. There is no tenderness.   Musculoskeletal: Normal range of motion. She exhibits no edema and no  tenderness.        Normal gait   Lymphadenopathy:        Head (right side): Submandibular adenopathy present.        Head (left side): Submandibular adenopathy present.     She has no cervical adenopathy.   Neurological: She is alert and oriented to person, place, and time.   Skin: Skin is warm and dry. No rash noted.         LABS on 05-15-2011 reviewed with patient.      ASSESSMENT and PLAN  1. HTN (hypertension), benign - well-controlled METABOLIC PANEL, COMPREHENSIVE   2. Hypercholesterolemia - well-controlled LIPID PANEL   3. Hypothyroidism - stable TSH, 3RD GENERATION   4. Asthma - well-controlled CBC WITH AUTOMATED DIFF   5. GERD (gastroesophageal reflux disease) - stable    6. Encounter for long-term (current) use of other medications  CK        Orders Placed This Encounter   ??? CBC W/ AUTOMATED DIFF   ??? LIPID PANEL   ??? COMP METABOLIC PANEL   ??? TSH   ??? CK       Follow-up Disposition:  Return in 4 months (on 09/19/2011) for follow up with fasting LABS.  current treatment plan is effective, no change in therapy  lab results and schedule of future lab studies reviewed with patient  reviewed diet, exercise and weight control  cardiovascular risk and specific lipid/LDL goals reviewed

## 2011-05-20 NOTE — Patient Instructions (Addendum)
Hypothyroidism: After Your Visit  Your Care Instructions  You have hypothyroidism, which means that your body is not making enough thyroid hormone. This hormone helps your body use energy. If your thyroid level is low, you may feel tired, be constipated, have an increase in your blood pressure, or have dry skin or memory problems. You may also get cold easily, even when it is warm. Women with low thyroid levels may have heavy menstrual periods.  A blood test to find your thyroid-stimulating hormone (TSH) level is used to check for hypothyroidism. A high TSH level may mean that you have low thyroid. When your body is not making enough thyroid hormone, TSH levels rise in an effort to make the body produce more.  The treatment for hypothyroidism is to take thyroid hormone pills. You should start to feel better in 1 to 2 weeks. But it can take several months to see changes in the TSH level. You will need regular visits with your doctor to make sure you have the right dose of medicine.  Most people need treatment for the rest of their lives. You will need to see your doctor regularly to have blood tests and to make sure you are doing well.  Follow-up care is a key part of your treatment and safety. Be sure to make and go to all appointments, and call your doctor if you are having problems. It???s also a good idea to know your test results and keep a list of the medicines you take.  How can you care for yourself at home?  ?? Take your thyroid hormone medicine exactly as prescribed. Call your doctor if you think you are having a problem with your medicine. Most people do not have side effects if they take the right amount of medicine regularly.   ?? Take the medicine 30 minutes before breakfast, and do not take it with calcium, vitamins, or iron.   ?? Do not take extra doses of your thyroid medicine. It will not help you get better any faster, and it may cause side effects.    ?? If you forget to take a dose, do NOT take a double dose of medicine. Take your usual dose the next day.   ?? Tell your doctor about all prescription, herbal, or over-the-counter products you take.   ?? Take care of yourself. Eat a healthy diet, get enough sleep, and get regular exercise.   When should you call for help?  Call 911 anytime you think you may need emergency care. For example, call if:  ?? You passed out (lost consciousness).   ?? You have severe trouble breathing.   ?? You have a very slow heartbeat (less than 60 beats a minute).   ?? You have a low body temperature (95??F or below).   Call your doctor now or seek immediate medical care if:  ?? You feel tired, sluggish, or weak.   ?? You have trouble remembering things or concentrating.   ?? You do not begin to feel better 2 weeks after starting your medicine.   Watch closely for changes in your health, and be sure to contact your doctor if you have any problems.    Where can you learn more?    Go to http://www.healthwise.net/[facility]   Enter N862 in the search box to learn more about "Hypothyroidism: After Your Visit."    ?? 2006-2012 Healthwise, Incorporated. Care instructions adapted under license by [employer] (which disclaims liability or warranty for this information). This   care instruction is for use with your licensed healthcare professional. If you have questions about a medical condition or this instruction, always ask your healthcare professional. Healthwise, Incorporated disclaims any warranty or liability for your use of this information.  Content Version: 9.3.73196; Last Revised: March 31, 2010

## 2011-10-20 MED ORDER — SERTRALINE 50 MG TAB
50 mg | ORAL_TABLET | Freq: Every day | ORAL | Status: DC
Start: 2011-10-20 — End: 2011-12-18

## 2011-10-20 NOTE — Progress Notes (Signed)
4 month follow up.  Flu vaccine given in left deltoid with no complications.
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
## 2011-10-20 NOTE — Patient Instructions (Addendum)
High Cholesterol: After Your Visit  Your Care Instructions  Cholesterol is a type of fat in your blood. It is needed for many body functions, such as making new cells. Cholesterol is made by your body and also comes from food you eat. High cholesterol means that you have too much of the fat in your blood.  LDL and HDL are part of your total cholesterol. LDL is the "bad" cholesterol that builds up inside the blood vessel walls, making them too narrow. This reduces the flow of blood and can cause a heart attack or stroke. HDL is the "good" cholesterol that helps clear bad cholesterol from the body. You want your good cholesterol to be high and your bad cholesterol to be low. If you do this, you can reduce your chance of having a heart attack or a stroke.  You can improve your cholesterol levels by eating less animal and trans fat and more vegetables. Getting regular exercise can also help. But for some people, cholesterol problems run in the family. If changes in diet and exercise don't improve your cholesterol levels, talk to your doctor about using medicine.  Follow-up care is a key part of your treatment and safety. Be sure to make and go to all appointments, and call your doctor if you are having problems. It???s also a good idea to know your test results and keep a list of the medicines you take.  How can you care for yourself at home?  ?? Eat a variety of foods every day. Good choices include fruits, vegetables, whole grains (like oatmeal), dried beans and peas, nuts and seeds, soy products (like tofu), and fat-free or low-fat dairy products.   ?? Replace butter, margarine, and hydrogenated or partially hydrogenated oils with olive and canola oils. (Canola oil margarine without trans fat is fine.)   ?? Replace red meat with fish, poultry, and soy protein (like tofu).   ?? Limit processed and packaged foods like chips, crackers, and cookies.   ?? Bake, broil, or steam foods instead of frying them.    ?? Limit foods high in cholesterol, such as egg yolks.   ?? Be physically active. Exercise increases your HDL, or good cholesterol level. Get at least 30 minutes of exercise on most days of the week. Walking is a good choice. You also may want to do other activities, such as running, swimming, cycling, or playing tennis or team sports.   ?? Stay at a healthy weight or lose weight by making the changes in eating and physical activity presented above. Losing just a small amount of weight, even 5 to 10 pounds, can reduce your risk for having a heart attack or stroke.   When should you call for help?  Watch closely for changes in your health, and be sure to contact your doctor if:  ?? You need more help controlling your cholesterol.     Where can you learn more?    Go to http://www.healthwise.net/[facility]   Enter I865 in the search box to learn more about "High Cholesterol: After Your Visit."    ?? 2006-2012 Healthwise, Incorporated. Care instructions adapted under license by [place] [employer] (which disclaims liability or warranty for this information). This care instruction is for use with your licensed healthcare professional. If you have questions about a medical condition or this instruction, always ask your healthcare professional. Healthwise, Incorporated disclaims any warranty or liability for your use of this information.  Content Version: 9.4.94723; Last Revised: February 24, 2011            Depression Treatment: After Your Visit  Your Care Instructions  Depression is a condition that affects the way you feel, think, and act. It causes symptoms such as low energy, loss of interest in daily activities, and sadness or grouchiness that goes on for a long time. Depression is very common and affects men and women of all ages.   Depression is a medical illness caused by changes in the natural chemicals in your brain. It is not a character flaw, and it does not mean that you are a bad or weak person. It does not mean that you are going crazy.  It is important to know that depression can be treated. Medicines, counseling, and self-care can all help. Many people do not get help because they are embarrassed or think that they will get over the depression on their own. But some people do not get better without treatment.  Follow-up care is a key part of your treatment and safety. Be sure to make and go to all appointments, and call your doctor if you are having problems. It???s also a good idea to know your test results and keep a list of the medicines you take.  How can you care for yourself at home?  Learn about antidepressant medicines  Antidepressant medicines can improve or end the symptoms of depression. You may need to take the medicine for at least 6 months, and often longer. Keep taking your medicine even if you feel better. If you stop taking it too soon, your symptoms may come back or get worse.  You may start to feel better within 1 to 3 weeks of taking antidepressant medicine. But it can take as many as 6 to 8 weeks to see more improvement. Talk to your doctor if you have problems with your medicine or if you do not notice any improvement after 3 weeks.  Antidepressants can make you feel tired, dizzy, or nervous. Some people have dry mouth, constipation, headaches, sexual problems, an upset stomach, or diarrhea. Many of these side effects are mild and go away on their own after you take the medicine for a few weeks. Some may last longer. Talk to your doctor if side effects bother you too much. You might be able to try a different medicine. If you are pregnant or breast-feeding, talk to your doctor about what medicines you can take.  Learn about counseling   In many cases, counseling can work as well as medicines to treat mild to moderate depression. Counseling is done by licensed mental health providers, such as psychologists, social workers, and some types of nurses. It can be done in one-on-one sessions or in a group setting. Many people find group sessions helpful.  Cognitive-behavioral therapy is a type of counseling. In this treatment therapy, you learn how to see and change unhelpful thinking styles that may be adding to your depression. Counseling and medicines often work well when used together.  To manage depression  ?? Be physically active. Getting 30 minutes of exercise each day is good for your body and your mind. Begin slowly if it is hard for you to get started. If you already exercise, keep it up.   ?? Plan something pleasant for yourself every day. Include activities that you have enjoyed in the past.   ?? Get enough sleep. Talk to your doctor if you have problems sleeping.   ?? Eat a balanced diet. If you do not feel hungry, eat small snacks rather than large meals.   ??  Do not drink alcohol, use illegal drugs, or take medicines that your doctor has not prescribed for you. They may interfere with your treatment.   ?? Spend time with family and friends. It may help to speak openly about your depression with people you trust.   ?? Take your medicines exactly as prescribed. Call your doctor if you think you are having a problem with your medicine.   ?? Do not make major life decisions while you are depressed. Depression may change the way you think. You will be able to make better decisions after you feel better.   ?? Think positively. Challenge negative thoughts with statements such as "I am hopeful"; "Things will get better"; and "I can ask for the help I need." Write down these statements and read them often, even if you don't believe them yet.    ?? Be patient with yourself. It took time for your depression to develop, and it will take time for your symptoms to improve. Do not take on too much or be too hard on yourself.   ?? Learn all you can about depression from written and online materials.   ?? Check out behavioral health classes to learn more about dealing with depression.   When should you call for help?  Call 911 anytime you think you may need emergency care. For example, call if:  ?? You feel you cannot stop from hurting yourself or someone else.   Call your doctor now or seek immediate medical care if:  ?? You hear voices.   ?? You feel much more depressed.   Watch closely for changes in your health, and be sure to contact your doctor if:  ?? You are having problems with your depression medicine.   ?? You are not getting better as expected.     Where can you learn more?    Go to [email or web address]   Enter [phone] in the search box to learn more about "Depression Treatment: After Your Visit."    ?? 2006-2012 Healthwise, Incorporated. Care instructions adapted under license by [employer] (which disclaims liability or warranty for this information). This care instruction is for use with your licensed healthcare professional. If you have questions about a medical condition or this instruction, always ask your healthcare professional. Healthwise, Inc[employer] disclaims any warranty or liability for your use of this information.  Content Version: 9.4.94723; Last Revised: February 21, 2010

## 2011-11-06 MED ORDER — SYNTHROID 175 MCG TABLET
175 mcg | ORAL_TABLET | ORAL | Status: DC
Start: 2011-11-06 — End: 2012-08-29

## 2011-11-06 MED ORDER — LOSARTAN-HYDROCHLOROTHIAZIDE 50 MG-12.5 MG TAB
ORAL_TABLET | ORAL | Status: DC
Start: 2011-11-06 — End: 2012-08-18

## 2011-11-09 ENCOUNTER — Encounter

## 2011-12-18 MED ORDER — SERTRALINE 100 MG TAB
100 mg | ORAL_TABLET | Freq: Every day | ORAL | Status: DC
Start: 2011-12-18 — End: 2012-04-13

## 2011-12-18 NOTE — Patient Instructions (Addendum)
Depression and Chronic Disease: After Your Visit  Your Care Instructions  A chronic disease is one that you have for a long time. Some chronic diseases can be controlled, but they usually cannot be cured. Depression is common in people with chronic diseases, but it often goes unnoticed.  Many people have concerns about seeking treatment for a mental health problem. You may think it's a sign of weakness, or you don't want people to know about it. It's important to overcome these reasons for not seeking treatment. Treating depression or anxiety is good for your health.  Follow-up care is a key part of your treatment and safety. Be sure to make and go to all appointments, and call your doctor if you are having problems. It's also a good idea to know your test results and keep a list of the medicines you take.  How can you care for yourself at home?  Watch for symptoms of depression  The symptoms of depression are often subtle at first. You may think they are caused by your disease rather than depression. Or you may think it is normal to be depressed when you have a chronic disease.  If you are depressed you may:  ?? Feel sad or hopeless.   ?? Feel guilty or worthless.   ?? Not enjoy the things you used to enjoy.   ?? Feel hopeless, as though life is not worth living.   ?? Have trouble thinking or remembering.   ?? Have low energy, and you may not eat or sleep well.   ?? Pull away from others.   ?? Think often about death or killing yourself.   Get treatment  By treating your depression, you can feel more hopeful and have more energy. If you feel better, you may take better care of yourself, so your health may improve.  ?? Talk to your doctor if you have any changes in mood during treatment for your disease.   ?? Ask your doctor for help. Counseling, antidepressant medicine, or a combination of the two can help most people with depression. Often a combination works best. Counseling can also help you cope with having a chronic  disease.   When should you call for help?  Call 911 anytime you think you may need emergency care. For example, call if:  ?? You feel like hurting yourself or someone else.   ?? Someone you know has depression and is about to attempt or is attempting suicide.   Call your doctor now or seek immediate medical care if:  ?? You hear voices.   ?? Someone you know has depression and:   ?? Starts to give away his or her possessions.   ?? Uses illegal drugs or drinks alcohol heavily.   ?? Talks or writes about death, including writing suicide notes or talking about guns, knives, or pills.   ?? Starts to spend a lot of time alone.   ?? Acts very aggressively or suddenly appears calm.   Watch closely for changes in your health, and be sure to contact your doctor if:  ?? You do not get better as expected.     Where can you learn more?    Go to http://www.healthwise.net/[facility]   Enter A548 in the search box to learn more about "Depression and Chronic Disease: After Your Visit."    ?? 2006-2012 Healthwise, Inc[employer]. Care instructions adapted under license by [employer] (which disclaims liability or warranty for this information). This care instruction is for use   with your licensed healthcare professional. If you have questions about a medical condition or this instruction, always ask your healthcare professional. Healthwise, Incorporated disclaims any warranty or liability for your use of this information.  Content Version: 9.5.76532; Last Revised: November 28, 2010

## 2011-12-18 NOTE — Progress Notes (Signed)
Cc: Follow up for Depression and Hypertension    HPI  Elaine [name] is a 60 y.o. African American female, returns today for follow up after having started on Zoloft with minimal improvement. She continues to have episodic crying spells and feelings of depression.     .  Review of Systems   Constitutional: Positive for malaise/fatigue. Negative for weight loss.   Respiratory: Negative for shortness of breath.    Cardiovascular: Negative for chest pain and palpitations.   Gastrointestinal: Negative for nausea, vomiting, abdominal pain and diarrhea.   Neurological: Negative for dizziness and headaches.   Psychiatric/Behavioral: Positive for memory loss. Negative for suicidal ideas and hallucinations. The patient is not nervous/anxious and does not have insomnia.        Patient Active Problem List   ??? Fatigue 780.79   ??? DOE (Dyspnea on Exertion) 786.09   ??? Hypothyroidism 244.9   ??? Hypercholesterolemia 272.0   ??? HTN (Hypertension), Benign 401.1   ??? GERD (Gastroesophageal Reflux Disease) 530.81   ??? Asthma 493.90   ??? Vitamin D Deficiency 268.9   ??? Iron Deficiency Anemia 280.9   ??? Hyperglycemia 790.29   ??? Submental lymphadenopathy 785.6   ??? Sciatica 724.3   ??? Depression 311       Current Outpatient Prescriptions   ??? losartan-hydrochlorothiazide (HYZAAR) 50-12.5 mg per tablet take 1 tablet by mouth once daily     ??? SYNTHROID 175 mcg tablet take 1 tablet by mouth once daily     ??? sertraline (ZOLOFT) 50 mg tablet Take 1 Tab by mouth daily.     ??? CRESTOR 20 mg tablet take 1 tablet by mouth once daily     ??? potassium chloride (K-DUR, KLOR-CON) 20 mEq tablet Take 1 Tab by mouth daily.     ??? montelukast (SINGULAIR) 10 mg tablet Take 1 Tab by mouth nightly as needed. Indications: asthma     ??? albuterol (PROVENTIL, VENTOLIN) 90 mcg/Actuation inhaler Take 2 Puffs by inhalation every six (6) hours as needed.       ??? Cholecalciferol, Vitamin D3, (VITAMIN D-3) 5,000 unit Tab Take  by mouth daily.       ??? thiamine (VITAMIN B-1) 100 mg  tablet Take  by mouth daily.             Blood pressure 134/68, pulse 79, resp. rate 12, height 5\' 1"  (1.549 m), weight 199 lb (90.266 kg), SpO2 97.00%.  Physical Exam   Vitals reviewed.  Constitutional: She is oriented to person, place, and time. No distress.   Cardiovascular: Normal rate, regular rhythm and normal heart sounds.    Pulmonary/Chest: Effort normal and breath sounds normal. No respiratory distress.   Neurological: She is alert and oriented to person, place, and time.   Psychiatric: Her speech is normal and behavior is normal. Thought content normal. She exhibits a depressed mood.        tearful       ASSESSMENT and PLAN  1. Depression - needs improvement sertraline (ZOLOFT) 100 mg tablet, REFERRAL TO PSYCHIATRY, METHYLMALONIC ACID   2. HTN (hypertension), benign - controlled METABOLIC PANEL, COMPREHENSIVE   3. Encounter for long-term (current) use of other medications  CK       Orders Placed This Encounter   ??? [id number] Jeannette Corpus   ??? [id number]   ??? LIPID PANEL   ??? CK   ??? METABOLIC PANEL, COMPREHENSIVE   ??? VITAMIN D, 25 HYDROXY   ??? TSH, 3RD GENERATION   ???  HEMOGLOBIN A1C   ??? METHYLMALONIC ACID   ??? REFERRAL TO PSYCHIATRY   ??? sertraline (ZOLOFT) 100 mg tablet       Follow-up Disposition:  Return in 2 months (on 02/15/2012) for follow up with fasting LABS.  current treatment plan is effective with the following changes in treatment are made:   increase Zoloft to 100 mg daily  lab results and schedule of future lab studies reviewed with patient  Reviewed plan of care. Patient has provided input and agrees with goals.

## 2011-12-18 NOTE — Progress Notes (Signed)
6 week follow up

## 2012-01-28 ENCOUNTER — Encounter

## 2012-02-05 NOTE — Progress Notes (Signed)
Cc: Follow up for Hypertension, Thyroid Problem, Cholesterol Problem and Depression    HPI  Elaine [name] is a 61 y.o. African American female, returns today for follow up feeling much better since her last office visit after having increased the dose of Zoloft which she is tolerating well with no reported side effect. She is sleeping better.   Today, she complains of pain in her right heel noted in the past 2 months which she describes as sharp, constant and varies in severity. No swelling but hurts to walk on it. No numbness, tingling or muscle weakness. No history of fall or trauma. She has been taking Tylenol which helped "sometimes".        Review of Systems   Constitutional: Positive for weight loss and malaise/fatigue. Negative for fever and chills.   Respiratory: Positive for cough. Negative for hemoptysis, shortness of breath and wheezing.    Cardiovascular: Negative for chest pain, palpitations, leg swelling and PND.   Gastrointestinal: Negative for heartburn, nausea, vomiting, abdominal pain, blood in stool and melena.   Genitourinary: Negative for hematuria.   Neurological: Negative for dizziness, loss of consciousness and headaches.   Psychiatric/Behavioral: Negative for depression and hallucinations. The patient is not nervous/anxious.          Patient Active Problem List   ??? DOE (Dyspnea on Exertion) 786.09   ??? Hypothyroidism 244.9   ??? Hypercholesterolemia 272.0   ??? HTN (Hypertension), Benign 401.1   ??? GERD (Gastroesophageal Reflux Disease) 530.81   ??? Asthma 493.90   ??? Vitamin D Deficiency 268.9   ??? Iron Deficiency Anemia 280.9   ??? Hyperglycemia 790.29   ??? Submental lymphadenopathy 785.6   ??? Sciatica 724.3   ??? Depression 311       Current Outpatient Prescriptions   ??? sertraline (ZOLOFT) 100 mg tablet Take 1 Tab by mouth daily.     ??? losartan-hydrochlorothiazide (HYZAAR) 50-12.5 mg per tablet take 1 tablet by mouth once daily     ??? SYNTHROID 175 mcg tablet take 1 tablet by mouth once daily     ???  CRESTOR 20 mg tablet take 1 tablet by mouth once daily     ??? Cholecalciferol, Vitamin D3, (VITAMIN D-3) 5,000 unit Tab Take  by mouth daily.     ??? potassium chloride (K-DUR, KLOR-CON) 20 mEq tablet Take 1 Tab by mouth daily.     ??? montelukast (SINGULAIR) 10 mg tablet Take 1 Tab by mouth nightly as needed. Indications: asthma     ??? thiamine (VITAMIN B-1) 100 mg tablet Take  by mouth daily.       ??? albuterol (PROVENTIL, VENTOLIN) 90 mcg/Actuation inhaler Take 2 Puffs by inhalation every six (6) hours as needed.             Blood pressure 134/80, pulse 88, resp. rate 12, height 5\' 1"  (1.549 m), weight 194 lb (87.998 kg), SpO2 96.00%.  Physical Exam   Vitals reviewed.  Constitutional: She is oriented to person, place, and time. She appears well-developed and well-nourished. No distress.   Eyes: Conjunctivae and EOM are normal. Pupils are equal, round, and reactive to light. No scleral icterus.   Neck: Neck supple. No JVD present. Carotid bruit is not present. No thyromegaly present.   Cardiovascular: Normal rate, regular rhythm and normal heart sounds.  Exam reveals no gallop.    Pulmonary/Chest: Effort normal and breath sounds normal. No respiratory distress. She has no rales.   Abdominal: Soft. Bowel sounds are normal. She  exhibits no distension. There is no tenderness.   Musculoskeletal: Normal range of motion. She exhibits no edema and no tenderness.        Right heel: intact skin, no soft tissue edema or erythema. No tenderness.   Neurological: She is alert and oriented to person, place, and time. Gait normal.   Skin: Skin is warm and dry.        (+)few papule on the medial and dorsal aspect of the right hand, non-tender.   Psychiatric: She has a normal mood and affect. Her speech is normal and behavior is normal. Cognition and memory are normal.       LABS on January 29, 2012 reviewed with patient.        ASSESSMENT and PLAN  1. Pain in right foot - new, needs further evaluation XR FOOT RT MIN 3 V   2. Skin  lesion of hand - etiology unclear, she declines referral to Dermatology Service at this time    3. HTN (hypertension), benign - reasonably controlled METABOLIC PANEL, COMPREHENSIVE   4. Hypercholesterolemia - improving, needs follow up LIPID PANEL   5. Hypothyroidism - stable TSH, 3RD GENERATION   6. Depression - improved    7. Asthma - stable    8. Hyperglycemia - needs follow up HEMOGLOBIN A1C   9. Encounter for long-term (current) use of other medications  CK         Orders Placed This Encounter   ??? XR FOOT RT MIN 3 V   ??? [id number] [facility] DRAW F[facility]   ??? [id number]   ??? TSH, 3RD GENERATION   ??? LIPID PANEL   ??? CK   ??? METABOLIC PANEL, COMPREHENSIVE   ??? HEMOGLOBIN A1C       I asked the patient to call the office to follow up x-ray report. She agreed.  Follow-up Disposition:  Return in 4 months (on 06/06/2012) for follow up with fasting LABS.  current treatment plan is effective, no change in therapy  lab results and schedule of future lab studies reviewed with patient  reviewed diet, exercise and weight control  cardiovascular risk and specific lipid/LDL goals reviewed  Discussed the patient's BMI with her.  The BMI follow up plan is as follows: BMI is out of normal parameters and plan is as follows: I have counseled this patient on diet and exercise regimens.  Reviewed plan of care. Patient has provided input and agrees with goals.

## 2012-02-05 NOTE — Progress Notes (Signed)
2 month follow up

## 2012-02-05 NOTE — Patient Instructions (Addendum)
Depression Treatment: After Your Visit  Your Care Instructions  Depression is a condition that affects the way you feel, think, and act. It causes symptoms such as low energy, loss of interest in daily activities, and sadness or grouchiness that goes on for a long time. Depression is very common and affects men and women of all ages.  Depression is a medical illness caused by changes in the natural chemicals in your brain. It is not a character flaw, and it does not mean that you are a bad or weak person. It does not mean that you are going crazy.  It is important to know that depression can be treated. Medicines, counseling, and self-care can all help. Many people do not get help because they are embarrassed or think that they will get over the depression on their own. But some people do not get better without treatment.  Follow-up care is a key part of your treatment and safety. Be sure to make and go to all appointments, and call your doctor if you are having problems. It???s also a good idea to know your test results and keep a list of the medicines you take.  How can you care for yourself at home?  Learn about antidepressant medicines  Antidepressant medicines can improve or end the symptoms of depression. You may need to take the medicine for at least 6 months, and often longer. Keep taking your medicine even if you feel better. If you stop taking it too soon, your symptoms may come back or get worse.  You may start to feel better within 1 to 3 weeks of taking antidepressant medicine. But it can take as many as 6 to 8 weeks to see more improvement. Talk to your doctor if you have problems with your medicine or if you do not notice any improvement after 3 weeks.  Antidepressants can make you feel tired, dizzy, or nervous. Some people have dry mouth, constipation, headaches, sexual problems, an upset stomach, or diarrhea. Many of these side effects are mild and go away on their own after you take the medicine  for a few weeks. Some may last longer. Talk to your doctor if side effects bother you too much. You might be able to try a different medicine. If you are pregnant or breast-feeding, talk to your doctor about what medicines you can take.  Learn about counseling  In many cases, counseling can work as well as medicines to treat mild to moderate depression. Counseling is done by licensed mental health providers, such as psychologists, social workers, and some types of nurses. It can be done in one-on-one sessions or in a group setting. Many people find group sessions helpful.  Cognitive-behavioral therapy is a type of counseling. In this treatment therapy, you learn how to see and change unhelpful thinking styles that may be adding to your depression. Counseling and medicines often work well when used together.  To manage depression  ?? Be physically active. Getting 30 minutes of exercise each day is good for your body and your mind. Begin slowly if it is hard for you to get started. If you already exercise, keep it up.   ?? Plan something pleasant for yourself every day. Include activities that you have enjoyed in the past.   ?? Get enough sleep. Talk to your doctor if you have problems sleeping.   ?? Eat a balanced diet. If you do not feel hungry, eat small snacks rather than large meals.   ?? Do   not drink alcohol, use illegal drugs, or take medicines that your doctor has not prescribed for you. They may interfere with your treatment.   ?? Spend time with family and friends. It may help to speak openly about your depression with people you trust.   ?? Take your medicines exactly as prescribed. Call your doctor if you think you are having a problem with your medicine.   ?? Do not make major life decisions while you are depressed. Depression may change the way you think. You will be able to make better decisions after you feel better.   ?? Think positively. Challenge negative thoughts with statements such as "I am hopeful";  "Things will get better"; and "I can ask for the help I need." Write down these statements and read them often, even if you don't believe them yet.   ?? Be patient with yourself. It took time for your depression to develop, and it will take time for your symptoms to improve. Do not take on too much or be too hard on yourself.   ?? Learn all you can about depression from written and online materials.   ?? Check out behavioral health classes to learn more about dealing with depression.   When should you call for help?  Call 911 anytime you think you may need emergency care. For example, call if:  ?? You feel you cannot stop from hurting yourself or someone else.   Call your doctor now or seek immediate medical care if:  ?? You hear voices.   ?? You feel much more depressed.   Watch closely for changes in your health, and be sure to contact your doctor if:  ?? You are having problems with your depression medicine.   ?? You are not getting better as expected.     Where can you learn more?    Go to http://www.healthwise.net/[facility]   Enter G693 in the search box to learn more about "Depression Treatment: After Your Visit."    ?? 2006-2012 Healthwise, Incorporated. Care instructions adapted under license by [employer] (which disclaims liability or warranty for this information). This care instruction is for use with your licensed healthcare professional. If you have questions about a medical condition or this instruction, always ask your healthcare professional. Healthwise, Inc[employer] disclaims any warranty or liability for your use of this information.  Content Version: 9.5.76532; Last Revised: February 21, 2010

## 2012-02-20 ENCOUNTER — Encounter

## 2012-02-20 MED ORDER — IOVERSOL 320 MG/ML IV SOLN
320 mg iodine/mL | Freq: Once | INTRAVENOUS | Status: AC
Start: 2012-02-20 — End: 2012-02-20
  Administered 2012-02-20: 15:00:00 via INTRAVENOUS

## 2012-02-20 MED FILL — OPTIRAY 320 MG IODINE/ML INTRAVENOUS SOLUTION: 320 mg iodine/mL | INTRAVENOUS | Qty: 100

## 2012-02-21 NOTE — Progress Notes (Signed)
Quick Note:    Please notify patient of recent Xray (foot) report which is normal. If she continues to have pain in her right foot, I can refer her to Dr. Mervyn Skeeters. Caines (Ortho). Let me know.    ______

## 2012-02-22 NOTE — Telephone Encounter (Signed)
Detailed vm left requesting a call back to discuss X-ray results and follow-up on pain

## 2012-02-22 NOTE — Telephone Encounter (Signed)
Message copied by Justice Britain on Mon Feb 22, 2012  9:16 AM  ------       Message from: Barbie Haggis D       Created: Sun Feb 21, 2012 10:54 AM         Please notify patient of recent Xray (foot) report which is normal. If she continues to have pain in her right foot, I can refer her to Dr. Mervyn Skeeters. Caines (Ortho). Let me know.

## 2012-02-25 LAB — CREATININE, POC
Creatinine, POC: 0.6 MG/DL (ref 0.6–1.3)
GFRAA, POC: >60 mL/min/{1.73_m2} (ref >60)
GFRNA, POC: >60 mL/min/{1.73_m2} (ref >60)

## 2012-04-13 ENCOUNTER — Encounter

## 2012-04-13 LAB — CBC WITH AUTOMATED DIFF
ABS. BASOPHILS: 0 10*3/uL (ref 0.0–0.1)
ABS. EOSINOPHILS: 0.2 10*3/uL (ref 0.0–0.4)
ABS. LYMPHOCYTES: 1.2 10*3/uL (ref 0.9–3.6)
ABS. MONOCYTES: 0.4 10*3/uL (ref 0.05–1.2)
ABS. NEUTROPHILS: 4.6 10*3/uL (ref 1.8–8.0)
BASOPHILS: 0 % (ref 0–2)
EOSINOPHILS: 2 % (ref 0–5)
HCT: 35.8 % (ref 35.0–45.0)
HGB: 11.6 g/dL — ABNORMAL LOW (ref 12.0–16.0)
LYMPHOCYTES: 18 % — ABNORMAL LOW (ref 21–52)
MCH: 25.8 PG (ref 24.0–34.0)
MCHC: 32.4 g/dL (ref 31.0–37.0)
MCV: 79.7 FL (ref 74.0–97.0)
MONOCYTES: 6 % (ref 3–10)
MPV: 11.2 FL (ref 9.2–11.8)
NEUTROPHILS: 74 % — ABNORMAL HIGH (ref 40–73)
PLATELET: 269 10*3/uL (ref 135–420)
RBC: 4.49 M/uL (ref 4.20–5.30)
RDW: 15.8 % — ABNORMAL HIGH (ref 11.6–14.5)
WBC: 6.4 10*3/uL (ref 4.6–13.2)

## 2012-04-14 LAB — METABOLIC PANEL, BASIC
Anion gap: 8 mmol/L (ref 3.0–18)
BUN/Creatinine ratio: 12 (ref 12–20)
BUN: 9 MG/DL (ref 7.0–18)
CO2: 29 MMOL/L (ref 21–32)
Calcium: 8.7 MG/DL (ref 8.5–10.1)
Chloride: 105 MMOL/L (ref 100–108)
Creatinine: 0.78 MG/DL (ref 0.6–1.3)
GFR est AA: >60 mL/min/{1.73_m2} (ref >60)
GFR est non-AA: >60 mL/min/{1.73_m2} (ref >60)
Glucose: 79 MG/DL (ref 74–99)
Potassium: 3.7 MMOL/L (ref 3.5–5.5)
Sodium: 142 MMOL/L (ref 136–145)

## 2012-04-14 LAB — EKG, 12 LEAD, INITIAL
Atrial Rate: 80 {beats}/min
Calculated P Axis: 82 degrees
Calculated R Axis: 49 degrees
Calculated T Axis: 41 degrees
Diagnosis: NORMAL
P-R Interval: 190 ms
Q-T Interval: 406 ms
QRS Duration: 86 ms
QTC Calculation (Bezet): 468 ms
Ventricular Rate: 80 {beats}/min

## 2012-04-20 NOTE — H&P (Signed)
[facility]                               [address] [place], [place] [address]                                   PRE-ADMISSION                             HISTORY AND PHYSICAL    PATIENT:    Elaine [name], Elaine [name]  MRN:           [id number]     DATE:   04/22/2012  BILLING:       [id number]    ROOM:  DICTATING:  Veda Canning., MD    HISTORY OF PRESENT ILLNESS: This 61 year old African American female is  admitted because of lymphadenopathy which fluctuates, and with the nodes  being tender. This has been a problem over the last 2 years. Her  rheumatologist, being Dr. Sheilah Mins, has requested an excisional biopsy of a  lymph node.    REVIEW OF SYSTEMS  Is negative at this time except for some constipation.    MEDICATIONS: She is on a medication for thyroid disease, cholesterol,  asthma and hypertension.    ALLERGIES: ASPIRIN PRODUCES ABDOMINAL DISCOMFORT AND BENADRYL ITCHING.    PHYSICAL EXAMINATION  GENERAL: Reveals a well-developed, well-nourished, overweight African  American female.  VITAL SIGNS: Weight 192 pounds, blood pressure 147/84, temperature 98.6,  pulse 82.  HEENT: Otoscopic examination is within normal limits bilaterally. Anterior  rhinoscopy is within normal limits. Oral cavity is positive for  dentures and small tonsils.  NECK: Palpation of the neck is positive for enlarged submandibular glands  with the right being tender. There is a small tender node anterior to the  left submandibular gland which is to be removed.  HEART: Heart reveals normal sinus rhythm and no murmurs.  LUNGS: Generalized harsh breath sounds without wheezing.  ABDOMEN: Protuberant and soft with no organomegaly or abnormal masses.  EXTREMITIES: Within normal limits.    The patient is being admitted with the diagnosis of fluctuating  lymphadenopathy of the neck with tenderness, secondary chronic  sialoadenitis of the submandibular glands.    PLAN: The patient is  to undergo an excisional biopsy of the node anterior  to the left submandibular gland. She realizes that a branch of the  mandibular nerve can be damaged which can affect the movement of the corner  of her mouth.                        Date:______Time:______Signature________________________________          Veda Canning., MD    EAM:wmx  D: 04/20/2012   1:48 P  T: 04/20/2012  2:11 P  Job: [id number]  CScriptDoc #: [id number]    cc:    Veda Canning., MD

## 2012-04-22 ENCOUNTER — Inpatient Hospital Stay: Payer: PRIVATE HEALTH INSURANCE

## 2012-04-22 MED ADMIN — famotidine (PEPCID) tablet 20 mg: ORAL | @ 12:00:00 | NDC 68084017211

## 2012-04-22 MED ADMIN — lactated ringers infusion: INTRAVENOUS | @ 12:00:00 | NDC 00409795309

## 2012-04-22 MED FILL — LACTATED RINGERS IV: INTRAVENOUS | Qty: 1000

## 2012-04-22 MED FILL — MIDAZOLAM 1 MG/ML IJ SOLN: 1 mg/mL | INTRAMUSCULAR | Qty: 2

## 2012-04-22 MED FILL — FENTANYL CITRATE (PF) 50 MCG/ML IJ SOLN: 50 mcg/mL | INTRAMUSCULAR | Qty: 5

## 2012-04-22 MED FILL — FAMOTIDINE 20 MG TAB: 20 mg | ORAL | Qty: 1

## 2012-04-22 NOTE — Op Note (Signed)
[facility]                               [address] [place], [place] [address]                                 OPERATIVE REPORT    PATIENT:     Elaine [name], Elaine [name]  MRN:            [id number]    DATE:   04/22/2012  BILLING:     [id number]  ROOM:        [id number]  ATTENDING:   Veda Canning., MD  DICTATING:   Veda Canning., MD      PREOPERATIVE DIAGNOSIS: Fluctuating lymphadenopathy with tenderness.    POSTOPERATIVE DIAGNOSIS: Fluctuating lymphadenopathy with tenderness.    PROCEDURES PERFORMED: Excisional biopsy of lymph node.    ESTIMATED BLOOD LOSS: 3 ml    SPECIMENS REMOVED:    DESCRIPTION OF PROCEDURE: Patient was placed in the dorsal supine position  under satisfactory oral endotracheal anesthesia, cardiac monitor and pulse  oximeter. Left side of the neck was prepped with Betadine. Patient  positioned with a roll under the shoulder and patient draped in a sterile  manner. There is a small lymph node anterior and deep to the left submandibular  gland. A horizontal incision was made over what was palpated to be a small  node. Initial bleeding was controlled with the electrocautery. There was  much fat in the area and dissection was carried out with the mosquito  hemostat and with retractors. It was very difficult to feel the lymph node.  A tissue difference was encountered with some firmness, which was felt to  be a lymph node and this was removed and sent for permanent section. No  other nodular area could be felt. Again, because of much fat, it was  difficult to isolate the node and there was an area could be palpated that  felt like a lymph node. The incision was then closed with sutures of 5-0  Vicryl and subcutaneous 5-0 Vicryl. A __pressure______ type dressing was then  placed over the incision. The patient endured  the procedure well and left the operating room in good condition.    POSTOPERATIVE IMPRESSION: Recurrent,  fluctuating lymphadenopathy with  tenderness. Final diagnosis pending pathology report.                        Date:______Time:______Signature________________________________          Veda Canning., MD    EAM:wmx  D: 04/22/2012 T: 04/22/2012 11:52 A  Job: [id number]  CScriptDoc #: [id number]    cc:     Veda Canning., MD

## 2012-04-22 NOTE — Progress Notes (Signed)
Post-Anesthesia Evaluation & Assessment    Visit Vitals   Item Reading   ??? BP 140/71   ??? Pulse 78   ??? Temp 97.2 ??F (36.2 ??C)   ??? Resp 24   ??? Ht 5\' 2"  (1.575 m)   ??? Wt 91.173 kg (201 lb)   ??? BMI 36.76 kg/m2   ??? SpO2 100%       Nausea/Vomiting: no nausea    Post-operative hydration adequate.    Pain score (VAS): 0    Mental status & Level of consciousness: alert and oriented x 3    Neurological status: moves all extremities, sensation grossly intact    Pulmonary status: airway patent, no supplemental oxygen required    Complications related to anesthesia: none    Patient has met all discharge requirements.    Additional comments:        Kirkland Hun, MD

## 2012-04-22 NOTE — Brief Op Note (Signed)
BRIEF OPERATIVE NOTE    Date of Procedure: 04/22/2012   Preoperative Diagnosis: 238.8  Postoperative Diagnosis: same  Pending pathology report  Procedure: Procedure(s):  EXCISIONAL BIOPSY OF NODE OF LEFT SIDE OF NECK   Surgeon(s) and Role:     * Atwood Adcock Yves Dill., MD - Primary  Anesthesia: General   Estimated Blood Loss: 2 ml  Specimens:   ID Type Source Tests Collected by Time Destination   1 : LYMPH NODE SUBMANDIBULAR LEFT NECK Fresh Lymph Node  Chiara Coltrin Yves Dill., MD 04/22/2012 0957 Pathology      Findings: Much fat in the operative area with the node difficult to palpate.  Complications: None  Implants: * No implants in log *

## 2012-04-22 NOTE — Op Note (Signed)
[facility]                               [address]                          [place],  [address]                                 OPERATIVE REPORT    PATIENT:     [name], Elaine A  MRN:            [id number]    DATE:   04/22/2012  BILLING:     [id number]  ROOM:        [id number]  ATTENDING:   ERNEST A. MURDEN, JR., MD  DICTATING:   ERNEST A. MURDEN, JR., MD      PREOPERATIVE DIAGNOSIS: Fluctuating lymphadenopathy with tenderness.    POSTOPERATIVE DIAGNOSIS: Fluctuating lymphadenopathy with tenderness.    PROCEDURES PERFORMED: Excisional biopsy of lymph node.    ESTIMATED BLOOD LOSS: 3 ml    SPECIMENS REMOVED:    DESCRIPTION OF PROCEDURE: Patient was placed in the dorsal supine position  under satisfactory oral endotracheal anesthesia, cardiac monitor and pulse  oximeter. Left side of the neck was prepped with Betadine. Patient  positioned with a roll under the shoulder and patient draped in a sterile  manner. There is a small lymph node anterior and deep to the left submandibular  gland. A horizontal incision was made over what was palpated to be a small  node. Initial bleeding was controlled with the electrocautery. There was  much fat in the area and dissection was carried out with the mosquito  hemostat and with retractors. It was very difficult to feel the lymph node.  A tissue difference was encountered with some firmness, which was felt to  be a lymph node and this was removed and sent for permanent section. No  other nodular area could be felt. Again, because of much fat, it was  difficult to isolate the node and there was an area could be palpated that  felt like a lymph node. The incision was then closed with sutures of 5-0  Vicryl and subcutaneous 5-0 Vicryl. A __pressure______ type dressing was then  placed over the incision. The patient endured  the procedure well and left the operating room in good condition.    POSTOPERATIVE IMPRESSION: Recurrent,  fluctuating lymphadenopathy with  tenderness. Final diagnosis pending pathology report.                        Date:______Time:______Signature________________________________          ERNEST A. MURDEN, JR., MD    EAM:wmx  D: 04/22/2012 T: 04/22/2012 11:52 A  Job: [id number]  CScriptDoc #: [id number]    cc:     ERNEST A. MURDEN, JR., MD

## 2012-04-24 MED FILL — ROCURONIUM 10 MG/ML IV: 10 mg/mL | INTRAVENOUS | Qty: 1

## 2012-04-24 MED FILL — QUELICIN 20 MG/ML INJECTION SOLUTION: 20 mg/mL | INTRAMUSCULAR | Qty: 1

## 2012-04-24 MED FILL — XYLOCAINE-MPF 20 MG/ML (2 %) INJECTION SOLUTION: 20 mg/mL (2 %) | INTRAMUSCULAR | Qty: 1

## 2012-04-24 MED FILL — DIPRIVAN 10 MG/ML INTRAVENOUS EMULSION: 10 mg/mL | INTRAVENOUS | Qty: 20

## 2012-04-24 MED FILL — LACTATED RINGERS IV: INTRAVENOUS | Qty: 1000

## 2012-06-06 NOTE — Patient Instructions (Addendum)
A Healthy Heart: After Your Visit  Your Care Instructions  Heart disease occurs when the vessels that supply oxygen-rich blood to your heart become narrow or blocked. A heart attack happens when blood flow is completely blocked. A high-fat diet, smoking, and other factors increase the risk of heart disease.  Your doctor has found that you have a chance of having heart disease. You can do lots of things to keep your heart healthy. It may not be easy, but you can change your diet, exercise more, and quit smoking. These steps really work to lower your chance of heart disease.  Follow-up care is a key part of your treatment and safety. Be sure to make and go to all appointments, and call your doctor if you are having problems. It's also a good idea to know your test results and keep a list of the medicines you take.  How can you care for yourself at home?  Diet  ?? Use less salt when you cook and eat. This helps lower your blood pressure. Taste food before salting. Add only a little salt when you think you need it. With time, your taste buds will adjust to less salt.   ?? Eat fewer snack items, fast foods, canned soups, and other high-salt, high-fat, processed foods.   ?? Read food labels and try to avoid saturated and trans fats. They increase your risk of heart disease by raising cholesterol levels.   ?? Limit the amount of solid fat???butter, margarine, and shortening???you eat. Use olive, peanut, or canola oil when you cook. Bake, broil, and steam foods instead of frying them.   ?? Eating fish can lower your risk for heart disease. Eat at least 2 servings of fish a week. Salmon, mackerel, herring, sardines, and chunk light tuna are very good choices. These fish contain omega-3 fatty acids.   ?? Eat a variety of fruit and vegetables every day. Dark green, deep orange, red, or yellow fruits and vegetables are especially good for you. Examples include spinach, carrots, peaches, and berries.   ?? Foods high in fiber can reduce  your cholesterol and provide important vitamins and minerals. High-fiber foods include whole-grain cereals and breads, oatmeal, beans, brown rice, citrus fruits, and apples.   ?? Limit drinks and foods with added sugar. These include candy, desserts, and soda pop.   Lifestyle changes  ?? If your doctor recommends it, get more exercise. Walking is a good choice. Bit by bit, increase the amount you walk every day. Try for at least 30 minutes on most days of the week. You also may want to swim, bike, or do other activities.   ?? Do not smoke. If you need help quitting, talk to your doctor about stop-smoking programs and medicines. These can increase your chances of quitting for good. Quitting smoking may be the most important step you can take to protect your heart. It is never too late to quit. You will get health benefits right away.   ?? Limit alcohol to 2 drinks a day for men and 1 drink a day for women. Too much alcohol can cause health problems.   Medicines  ?? Take your medicines exactly as prescribed. Call your doctor if you think you are having a problem with your medicine.   ?? If your doctor recommends aspirin, take the amount directed each day. Make sure you take aspirin and not another kind of pain reliever, such as acetaminophen (Tylenol). If you take ibuprofen (such as Advil or Motrin)   for other problems, take aspirin at least 2 hours before taking ibuprofen.   When should you call for help?  Call 911 if you have symptoms of a heart attack. These may include:  ?? You have chest pain or pressure. This may occur with:   ?? Chest pain or pressure, or a strange feeling in the chest.   ?? Sweating.   ?? Shortness of breath.   ?? Pain, pressure, or a strange feeling in the back, neck, jaw, or upper belly or in one or both shoulders or arms.   ?? Lightheadedness or sudden weakness.   ?? A fast or irregular heartbeat.   After you call 911, the operator may tell you to chew 1 adult-strength or 2 to 4 low-dose aspirin. Wait  for an ambulance. Do not try to drive yourself.  Watch closely for changes in your health, and be sure to contact your doctor if:  ?? Your symptoms are slowly getting worse.   ?? You do not get better as expected.     Where can you learn more?    Go to [employer]   Enter F075 in the search box to learn more about "A Healthy Heart: After Your Visit."    ?? 2006-2013 [employer]. Care instructions adapted under license by [employer] (which disclaims liability or warranty for this information). This care instruction is for use with your licensed healthcare professional. If you have questions about a medical condition or this instruction, always ask your healthcare professional. [employer], [employer] disclaims any warranty or liability for your use of this information.  Content Version: 9.7.130178; Last Revised: March 28, 2010          Hypothyroidism: After Your Visit  Your Care Instructions  You have hypothyroidism, which means that your body is not making enough thyroid hormone. This hormone helps your body use energy. If your thyroid level is low, you may feel tired, be constipated, have an increase in your blood pressure, or have dry skin or memory problems. You may also get cold easily, even when it is warm. Women with low thyroid levels may have heavy menstrual periods.  A blood test to find your thyroid-stimulating hormone (TSH) level is used to check for hypothyroidism. A high TSH level may mean that you have low thyroid. When your body is not making enough thyroid hormone, TSH levels rise in an effort to make the body produce more.  The treatment for hypothyroidism is to take thyroid hormone pills. You should start to feel better in 1 to 2 weeks. But it can take several months to see changes in the TSH level. You will need regular visits with your doctor to make sure you have the right dose of medicine.  Most people need treatment for the rest of their lives. You will  need to see your doctor regularly to have blood tests and to make sure you are doing well.  Follow-up care is a key part of your treatment and safety. Be sure to make and go to all appointments, and call your doctor if you are having problems. It???s also a good idea to know your test results and keep a list of the medicines you take.  How can you care for yourself at home?  ?? Take your thyroid hormone medicine exactly as prescribed. Call your doctor if you think you are having a problem with your medicine. Most people do not have side effects if they take the right amount of medicine regularly.   ??  Take the medicine 30 minutes before breakfast, and do not take it with calcium, vitamins, or iron.   ?? Do not take extra doses of your thyroid medicine. It will not help you get better any faster, and it may cause side effects.   ?? If you forget to take a dose, do NOT take a double dose of medicine. Take your usual dose the next day.   ?? Tell your doctor about all prescription, herbal, or over-the-counter products you take.   ?? Take care of yourself. Eat a healthy diet, get enough sleep, and get regular exercise.   When should you call for help?  Call 911 anytime you think you may need emergency care. For example, call if:  ?? You passed out (lost consciousness).   ?? You have severe trouble breathing.   ?? You have a very slow heartbeat (less than 60 beats a minute).   ?? You have a low body temperature (95??F or below).   Call your doctor now or seek immediate medical care if:  ?? You feel tired, sluggish, or weak.   ?? You have trouble remembering things or concentrating.   ?? You do not begin to feel better 2 weeks after starting your medicine.   Watch closely for changes in your health, and be sure to contact your doctor if you have any problems.    Where can you learn more?    Go to [employer]   Enter [phone] in the search box to learn more about "Hypothyroidism: After Your Visit."    ?? 2006-2013  [employer]. Care instructions adapted under license by [employer] (which disclaims liability or warranty for this information). This care instruction is for use with your licensed healthcare professional. If you have questions about a medical condition or this instruction, always ask your healthcare professional. [employer], [employer] disclaims any warranty or liability for your use of this information.  Content Version: 9.7.130178; Last Revised: March 31, 2010

## 2012-06-06 NOTE — Progress Notes (Signed)
Cc:  Follow-up for Right foot pain, Skin Problem, Hypertension, Cholesterol Problem, Thyroid Problem and Hyperglycemia    HPI  Elaine [name] is a 61 y.o. African American female, returns today for follow up with no further complaint of pain in her right foot. Since her last office visit she has been feeling well taking her medications with no problem. However, she misplaced all of her medications about 1 1/2 weeks ago which she just found 3 days ago. She missed taking her meds for the above length of time. There has been no problems with muscle pain or muscle weakness. She denies polydipsia, polyuria or symptoms of hypoglycemia.  She comes in today requesting a referral to see a dermatologist for treatment of recurrent Lichen planus, previously managed by the late Dr. Ruthann Cancer.         Patient Active Problem List   ??? DOE (Dyspnea on Exertion) 786.09   ??? Hypothyroidism 244.9   ??? Hypercholesterolemia 272.0   ??? HTN (Hypertension), Benign 401.1   ??? GERD (Gastroesophageal Reflux Disease) 530.81   ??? Asthma 493.90   ??? Vitamin D Deficiency 268.9   ??? Iron Deficiency Anemia 280.9   ??? Hyperglycemia 790.29   ??? Submental lymphadenopathy 785.6   ??? Sciatica 724.3   ??? Depression 311         Current Outpatient Prescriptions   ??? olmesartan (BENICAR) 20 mg tablet Take 20 mg by mouth daily.     ??? losartan-hydrochlorothiazide (HYZAAR) 50-12.5 mg per tablet take 1 tablet by mouth once daily     ??? SYNTHROID 175 mcg tablet take 1 tablet by mouth once daily     ??? CRESTOR 20 mg tablet take 1 tablet by mouth once daily     ??? Cholecalciferol, Vitamin D3, (VITAMIN D-3) 5,000 unit Tab Take  by mouth daily.     ??? potassium chloride (K-DUR, KLOR-CON) 20 mEq tablet Take 1 Tab by mouth daily.     ??? montelukast (SINGULAIR) 10 mg tablet Take 1 Tab by mouth nightly as needed. Indications: asthma     ??? thiamine (VITAMIN B-1) 100 mg tablet Take  by mouth daily.       ??? albuterol (PROVENTIL, VENTOLIN) 90 mcg/Actuation inhaler Take 2 Puffs by  inhalation every six (6) hours as needed.             Review of Systems   Constitutional: Negative for weight loss and malaise/fatigue.   Respiratory: Negative for cough, hemoptysis, shortness of breath and wheezing.    Cardiovascular: Negative for chest pain, palpitations, leg swelling and PND.   Gastrointestinal: Negative for heartburn, nausea, vomiting, abdominal pain, blood in stool and melena.   Genitourinary: Negative for hematuria.   Neurological: Negative for dizziness, loss of consciousness and headaches.   Psychiatric/Behavioral: Negative for depression. The patient is not nervous/anxious.          Blood pressure 138/80, pulse 75, temperature 98 ??F (36.7 ??C), temperature source Oral, resp. rate 17, height 5\' 2"  (1.575 m), weight 199 lb (90.266 kg), SpO2 98.00%.  Physical Exam   Vitals reviewed.  Constitutional: She is oriented to person, place, and time. She appears well-developed and well-nourished. No distress.   Eyes: Conjunctivae and EOM are normal. Pupils are equal, round, and reactive to light. No scleral icterus.   Neck: Neck supple. No JVD present. Carotid bruit is not present. No thyromegaly present.   Cardiovascular: Normal rate, regular rhythm and normal heart sounds.  Exam reveals no gallop.  Pulmonary/Chest: Effort normal and breath sounds normal. No respiratory distress. She has no rales.   Abdominal: Soft. Bowel sounds are normal. She exhibits no distension. There is no tenderness.   Musculoskeletal: Normal range of motion. She exhibits no edema and no tenderness.   Neurological: She is alert and oriented to person, place, and time. Gait normal.   Skin: Skin is warm and dry.        (+)few non-tender hyperpigmented papules on the medial and dorsal aspect of the right hand.   Psychiatric: She has a normal mood and affect. Her speech is normal. Cognition and memory are normal.       LABS on May 30, 2012 reviewed with patient:        ASSESSMENT and PLAN  1. Lichen planus - recurrent, needs  follow up re-evaluation/management REFERRAL TO DERMATOLOGY   2. Hypothyroidism - inadequately replaced due to not having taken medications which I think had been > a week TSH, 3RD GENERATION   3. HTN (hypertension), benign -  reasonably controlled  METABOLIC PANEL, COMPREHENSIVE   4. Hypercholesterolemia -  poorly controlled, patient poorly compliant, needs to follow diet more regularly  LIPID PANEL   5. Hyperglycemia with normal HBA1C - improved    6. Encounter for long-term (current) use of other medications  CK, CBC WITH AUTOMATED DIFF         Orders Placed This Encounter   ??? [id number] Jeannette Corpus   ??? [id number]   ??? LIPID PANEL   ??? CK   ??? METABOLIC PANEL, COMPREHENSIVE   ??? TSH, 3RD GENERATION   ??? CBC WITH AUTOMATED DIFF   ??? REFERRAL TO DERMATOLOGY       Follow-up Disposition:  Return in 3 months (on 09/06/2012) for follow up with fasting LABS.  current treatment plan is effective, no change in therapy   I advised the patient to call my office for refill anytime she has problems with her medications, she was made aware that I could have authorized refill of her misplaced medications at any time she incurs problems which could potentially prevent her from taking daily prescribed meds.  lab results and schedule of future lab studies reviewed with patient  reviewed diet, exercise and weight control  cardiovascular risk and specific lipid/LDL goals reviewed  Reviewed plan of care. Patient has provided input and agrees with goals.

## 2012-06-06 NOTE — Progress Notes (Signed)
Pt in office today for 4 month f/u

## 2012-07-03 MED ORDER — CRESTOR 20 MG TABLET
20 mg | ORAL_TABLET | ORAL | Status: DC
Start: 2012-07-03 — End: 2012-08-26

## 2012-07-05 ENCOUNTER — Encounter

## 2012-08-18 NOTE — Patient Instructions (Signed)
Please have labs done to check cholesterol.  Fast 12-15 hours prior to having labs done!  Water only please.    If you do not receive your testing results within 2 weeks of the test date, please call our office at [phone] (Option 4).  Thank you.  Carter Kitten, MD

## 2012-08-18 NOTE — Progress Notes (Signed)
History of present illness:  [name] is a 61 year old African-American woman referred by Elaine [name] for evaluation of incidentally coronary calcifications on a CT scan obtained in March 2013.  Elaine [name] has no prior history of coronary artery disease.  She has well controlled hypertension and hyperlipidemia.  She did have a noninvasive cardiac evaluation performed for shortness of breath in April 2005 that consisted of a normal echocardiogram and a normal Persantine thallium nuclear stress test.  She had a chest CT done for evaluation of lymphadenitis and an incidental finding was coronary calcification.  She has long-standing asthma which is minimally symptomatic but occasionally makes her short of breath with exertion.  She does not get episodes of chest pain, pressure, tightness or heaviness.  She works as a Dentist.  She walks on a regular basis and is generally not limited unless she walks up stairs at which case she tends to become tired and somewhat short of breath.  She denies any chest discomfort or shortness of breath at other points in time.  She has been evaluated on multiple occasions over the years for iron deficiency anemia which is felt to be due to occult small intestine blood loss.  She receives iron therapy and occasional and occasional parenteral iron therapy.  She is followed by a gastroenterologist in [place], [place] who does yearly pill cameras.  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  She has never had a major GI bleed.  She has been instructed not to take aspirin secondary to this iron deficiency anemia.  She takes Crestor for her hyperlipidemia but has not had a recent lipid panel in approximately one year.    Impression and plan:    1.  Coronary artery disease.  She has coronary calcification which  is diagnostic for atherosclerotic coronary artery disease.  She has no symptoms of angina pectoris and had a negative non-invasive evaluation in 2005.  It is certainly possible that some of her exertional dyspnea is an anginal equivalent, but I suspect that this is fairly unlikely.  I ordered a lexiscan nuclear stress test to assess for obstructive coronary atherosclerosis.  If this is normal, no further invasive testing will, of course, will be indicated.  Ideally, she should be treated with antiplatelet therapy for atherosclerotic disease prevention.  She cannot take aspirin secondary to her chronic iron deficiency anemia and, certainly I would not recommend starting Plavix.  It appears that antiplatelet therapy may not be associated with favorable risk/benefit profile in her case.  She will bring in the name of her gastroenterologist so that I may follow up this question with him specifically at some point in the future.  Certainly, at this time I would not recommend starting antiplatelet therapy.  2.  Hyperlipidemia.  Lipid management needs to be a strong cornerstone of her therapy for her atherosclerosis.  Crestor is certainly a very effective agent.  I ordered a fasting lipid panel.  Her target LDL cholesterol is less than 70.  3.  Hypertension.  Her blood pressure is very well controlled on her current antihypertensive therapy.    It has been my pleasure seeing Elaine [name] today.  I will review her upcoming nuclear stress test and provide further treatment and evaluation as indicated.    MedDATA/cjl         Past Medical History  Diagnosis Date   ??? Iron deficiency anemia 02/15/2009   ??? 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 Hypertension    ??? Hypercholesterolemia    ??? Thyroid disease      hypothyroidism   ??? Asthma    ??? Right sided sciatica    ??? Colon polyp    ??? Depression 12/18/2011   ??? OA (osteoarthritis of spine) 02/15/2009   ??? DDD (degenerative disc disease) 02/15/2009   ??? OA (osteoarthritis of spine) 02/15/2009   ??? Sleep  apnea 02/15/2009     uses cpap machine   ??? History of echocardiogram 03/20/2004     EF 70%.  Borderline DDfx.  No significant valvular pathology.   ??? Thallium stress test  03/20/2004     Partially transient, mod basal & mid anterior defect most c/w artifact; mild anterior ischemia less likely.  Neg EKG on pharm stress test.   ??? Lower extremity venous duplex 10/23/2009     Left leg:  No DVT.         Current Outpatient Prescriptions   Medication Sig Dispense Refill   ??? CRESTOR 20 mg tablet take 1 tablet by mouth once daily  30 Tab  5   ??? olmesartan (BENICAR) 20 mg tablet Take 20 mg by mouth daily.       ??? SYNTHROID 175 mcg tablet take 1 tablet by mouth once daily  30 Tab  3   ??? Cholecalciferol, Vitamin D3, (VITAMIN D-3) 5,000 unit Tab Take  by mouth daily.       ??? potassium chloride (K-DUR, KLOR-CON) 20 mEq tablet Take 1 Tab by mouth daily.  30 Tab  4   ??? montelukast (SINGULAIR) 10 mg tablet Take 1 Tab by mouth nightly as needed. Indications: asthma  90 Tab  3   ??? thiamine (VITAMIN B-1) 100 mg tablet Take  by mouth daily.       ??? albuterol (PROVENTIL, VENTOLIN) 90 mcg/Actuation inhaler Take 2 Puffs by inhalation every six (6) hours as needed.           Surgical History  Past Surgical History   Procedure Date   ??? Pr neurological procedure unlisted 03-16-2009     s/p ACD & Fusion (ElaineP.Gurtner)   ??? Pr colonoscopy,diagnostic 02-2007     +polyp(tubular adenoma); Dr Adelene Idler   ??? Hx tubal ligation    ??? Hx hysterectomy    ??? Hx hernia repair      3x       Social History   reports that she quit smoking about 8 years ago. She has never used smokeless tobacco.   reports that she does not drink alcohol.    Family History  family history includes Breast Cancer in her maternal aunt; Cancer in her maternal aunt and mother; Diabetes in her paternal uncle; Glaucoma in her son; Heart Attack in her sister; Heart Disease in her sister; Heart Surgery in her sister; Heart defect in her son; and Hypertension in her mother, paternal uncle, and  sister.    Review of Systems  Except as stated above include:  Constitutional: Negative.  HEENT: Negative.   Respiratory: Negative.  Gastrointestinal: Negative.  Genitourinary: Negative.   Musculoskeletal: Negative.  Neurological: Negative.   Endocrine:  Negative  Psychiatric:  Negative  Hematologic: Negative.  Skin: Negative.    PHYSICAL EXAM  BP Readings from Last 3 Encounters:   08/18/12 112/70   06/06/12 138/80   04/22/12 140/71     Pulse Readings from Last 3 Encounters:  08/18/12 89   06/06/12 75   04/22/12 78     Wt Readings from Last 3 Encounters:   08/18/12 86.183 kg (190 lb)   06/06/12 90.266 kg (199 lb)   04/22/12 91.173 kg (201 lb)     General:  Alert and oriented to person, place, and time.  No acute distress.  Head and Neck:  No jugular venous distention or carotid bruits.  Lungs:  Clear bilaterally.  Heart:  Regular rate and rhythm.  Normal S1/S2.  No murmurs, rubs or gallops.  Abdomen:  Soft and nontender.  Extremities:  No edema.  Neurological:  Grossly normal.    EKG; NSR and normal    Lab Results   Component Value Date/Time    Cholesterol, total 173 12/12/2010  9:41 AM    HDL Cholesterol 51 12/12/2010  9:41 AM    LDL, calculated 102 12/12/2010  9:41 AM    Triglyceride 102 12/12/2010  9:41 AM    CHOL/HDL Ratio 3.4 06/27/2009  8:49 AM       Lab Results   Component Value Date/Time    Sodium 142 04/13/2012  6:30 PM    Potassium 3.7 04/13/2012  6:30 PM    Chloride 105 04/13/2012  6:30 PM    CO2 29 04/13/2012  6:30 PM    Anion gap 8 04/13/2012  6:30 PM    Glucose 79 04/13/2012  6:30 PM    BUN 9 04/13/2012  6:30 PM    Creatinine 0.78 04/13/2012  6:30 PM    BUN/Creatinine ratio 12 04/13/2012  6:30 PM    GFR est AA >60 04/13/2012  6:30 PM    GFR est non-AA >60 04/13/2012  6:30 PM    Calcium 8.7 04/13/2012  6:30 PM    Bilirubin, total 0.5 12/12/2010  9:41 AM    ALT 12 12/12/2010  9:41 AM    AST 12 12/12/2010  9:41 AM    Alk. phosphatase 79 12/12/2010  9:41 AM    Protein, total 7.2 12/12/2010  9:41 AM    Albumin 4.5 12/12/2010  9:41 AM    Globulin 3.1  06/27/2009  8:49 AM    A-G Ratio 1.7 12/12/2010  9:41 AM     Lab Results   Component Value Date/Time    Sodium 142 04/13/2012  6:30 PM    Potassium 3.7 04/13/2012  6:30 PM    Chloride 105 04/13/2012  6:30 PM    CO2 29 04/13/2012  6:30 PM    Anion gap 8 04/13/2012  6:30 PM    Glucose 79 04/13/2012  6:30 PM    BUN 9 04/13/2012  6:30 PM    Creatinine 0.78 04/13/2012  6:30 PM    BUN/Creatinine ratio 12 04/13/2012  6:30 PM    GFR est non-AA >60 04/13/2012  6:30 PM    Calcium 8.7 04/13/2012  6:30 PM    GFR est AA >60 04/13/2012  6:30 PM        Chest CT scan done 02/21/12:  "There are coronary calcifications".    Echocardiogram 03/20/04: Normal study with EF 70%.    Persantine thallium Scan 03/20/04: Normal

## 2012-08-24 MED ADMIN — sodium chloride (NS) flush 10 mL: INTRAVENOUS | @ 12:00:00 | NDC 87701099893

## 2012-08-24 MED ADMIN — sodium chloride (NS) flush 10 mL: INTRAVENOUS | @ 15:00:00 | NDC 87701099893

## 2012-08-24 MED ADMIN — regadenoson (LEXISCAN) injection 0.4 mg: INTRAVENOUS | @ 14:00:00 | NDC 00469650189

## 2012-08-24 MED FILL — LEXISCAN 0.4 MG/5 ML INTRAVENOUS SYRINGE: 0.4 mg/5 mL | INTRAVENOUS | Qty: 5

## 2012-08-24 MED FILL — BD POSIFLUSH NORMAL SALINE 0.9 % INJECTION SYRINGE: INTRAMUSCULAR | Qty: 10

## 2012-08-24 NOTE — Progress Notes (Signed)
Patient was injected with 10.34 millicuries Sestamibi on 08/24/12 at 0740.    Patient was injected with 33.0 millicuries Sestamibi on 08/24/12 at 0940.    Patient's armbands were removed and placed in shred-it box.

## 2012-08-25 ENCOUNTER — Encounter

## 2012-08-25 NOTE — Procedures (Signed)
[facility]                            [facility]                               [address]                          [place], [place] [address]                           NUCLEAR CARDIOLOGY REPORT    PATIENT:    Elaine [name], Elaine [name]  MRN:        [id number]  BILLING:    [id number]  DATE:       08/24/2012  LOCATION:  REFERRING:  Jesusita Oka, MD  DICTATING:  Trina Ao, DO      LEXISCAN CARDIOLITE MYOCARDIAL PERFUSION STUDY    REASON FOR STUDY: Dyspnea on exertion, rule out anginal equivalent.    ELECTROCARDIOGRAM RESTING: Demonstrated normal sinus rhythm, rate 60, and  it was within normal limits.    PROCEDURE: The patient received 10.32 mCi of Cardiolite intravenously and  had a resting myocardial perfusion study completed. She subsequently was  given Lexiscan 0.4 mg intravenously followed by a 5 mL saline flush and was  asked to walk at 0.8 miles an hour for 3 minutes. She complained of  shortness of breath and lightheadedness with Lexiscan administration, but  had no chest pain or electrocardiographic changes. She subsequently  received 33 mCi of Lexiscan intravenously and had a stress myocardial  perfusion study completed and compared to the resting study.    FINDINGS: There appeared to be a very mild fixed perfusion defect in the  high anterior wall, with relative sparing of the apex and intraventricular septum,  which was actually better perfused on stress than with rest and which  appeared to be related to soft tissue breast attenuation. The remainder of  the myocardium appeared to be adequately perfused. Gated SPECT imaging  demonstrated normal left ventricular volumes with some very mild basal  inferior wall hypokinesia with otherwise normal wall motion and normal  overall left ventricular function, ejection fraction of 64%.    SUMMARY:  1. Presumed normal Lexiscan Cardiolite myocardial perfusion study.  2. Very mild fixed perfusion defect  in the high anterior wall, with relative sparing  of the apex and intraventricular septum, which was actually better perfused  with stress and was felt to be related to soft tissue breast attenuation  with the remainder of the myocardium well perfused without signs of  ischemia or infarction.  3. Normal left ventricular volumes with very mild basal inferior wall  hypokinesia with otherwise normal wall motion and normal overall left  ventricular function, ejection fraction of 64% on gated SPECT imaging.  4. Negative pharmacologic stress test electrocardiographically.  5. This was a low risk Lexiscan Cardiolite myocardial perfusion study.                       Trina Ao, DO      ES:wmx  D: 08/24/2012  CScript:08/25/2012 03:37 A  CQDocID #: [id number]   CScriptDoc #:  [id number]   cc:   Jesusita Oka, MD         Trina Ao, DO  Carter Kitten, MD  DR. Roney Marion

## 2012-08-25 NOTE — Procedures (Signed)
[facility]                            [facility]                               [address]                          [place], [place] [address]                           NUCLEAR CARDIOLOGY REPORT    PATIENT:    Elaine [name], Elaine [name]  MRN:        [id number]  BILLING:    [id number]  DATE:       08/24/2012  LOCATION:  REFERRING:  ELIZABETH D. FORREST, MD  DICTATING:  Alexsia Klindt, DO      LEXISCAN CARDIOLITE MYOCARDIAL PERFUSION STUDY    REASON FOR STUDY: Dyspnea on exertion, rule out anginal equivalent.    ELECTROCARDIOGRAM RESTING: Demonstrated normal sinus rhythm, rate 60, and  it was within normal limits.    PROCEDURE: The patient received 10.32 mCi of Cardiolite intravenously and  had [name] resting myocardial perfusion study completed. She subsequently was  given Lexiscan 0.4 mg intravenously followed by [name] 5 mL saline flush and was  asked to walk at 0.8 miles an hour for 3 minutes. She complained of  shortness of breath and lightheadedness with Lexiscan administration, but  had no chest pain or electrocardiographic changes. She subsequently  received 33 mCi of Lexiscan intravenously and had [name] stress myocardial  perfusion study completed and compared to the resting study.    FINDINGS: There appeared to be [name] very mild fixed perfusion defect in the  high anterior wall, with relative sparing of the apex and intraventricular septum,  which was actually better perfused on stress than with rest and which  appeared to be related to soft tissue breast attenuation. The remainder of  the myocardium appeared to be adequately perfused. Gated SPECT imaging  demonstrated normal left ventricular volumes with some very mild basal  inferior wall hypokinesia with otherwise normal wall motion and normal  overall left ventricular function, ejection fraction of 64%.    SUMMARY:  1. Presumed normal Lexiscan Cardiolite myocardial perfusion study.  2. Very mild fixed perfusion defect  in the high anterior wall, with relative sparing  of the apex and intraventricular septum, which was actually better perfused  with stress and was felt to be related to soft tissue breast attenuation  with the remainder of the myocardium well perfused without signs of  ischemia or infarction.  3. Normal left ventricular volumes with very mild basal inferior wall  hypokinesia with otherwise normal wall motion and normal overall left  ventricular function, ejection fraction of 64% on gated SPECT imaging.  4. Negative pharmacologic stress test electrocardiographically.  5. This was [name] low risk Lexiscan Cardiolite myocardial perfusion study.                       Kalei Mckillop, DO      ES:wmx  D: 08/24/2012  CScript:08/25/2012 03:37 [name]  CQDocID #: [id number]   CScriptDoc #:  [id number]   cc:   ELIZABETH D. FORREST, MD         Amrom Ore, DO           KEVIN E. ZAWACKI, MD  DR. FOUST

## 2012-08-26 NOTE — Patient Instructions (Signed)
Increase to Crestor 40mg daily

## 2012-08-26 NOTE — Progress Notes (Signed)
History of Present Illness:  Elaine [name] returns for cardiovascular follow up.  She is a 61 year old African American woman whom I evaluated for the first time on 08/22/2012.  She was referred for evaluation of incidentally discovered coronary calcification on a chest CT scan.  She has no clinical history of coronary artery disease and has hypertension and hyperlipidemia.  I recommended a nuclear stress test which was performed.  I reviewed it and it was basically within normal limits.  There was some breast attenuation artifact but the left ventricular function was normal and no further evaluation is indicated.  She is unable to take antiplatelet therapy secondary to chronic presumed small bowel blood loss and iron deficiency anemia.  She feels well and has no cardiovascular complaints.  Her medications were reviewed and reconciled at the time of this evaluation.    A lipid panel obtained on 08/24/2012, showed a total cholesterol of 170, triglycerides 98, HDL 49 and LDL 101.  She has been on Crestor at 20 mg daily for the past two years.    Impression and Plan:   1.    Coronary artery disease.  She has nonobstructive coronary atherosclerosis noted on CT scan.  Her nuclear stress test was normal and she was asymptomatic.  No further diagnostic evaluation for this is indicated at this time.  Aggressive secondary prevention is indicated.  I would not recommend antiplatelet therapy with Aspirin or Plavix secondary to her chronic iron deficiency anemia.  2.  Hyperlipidemia.  Her most recent lipid panel was reviewed and I noted that her LDL cholesterol was 101.  Because she has evidence of coronary atherosclerosis on CT scan I would recommend more aggressive treatment with a target LDL cholesterol of less than 70.  I increased her Crestor to 40 mg daily.  3.  Hypertension.  Her blood pressure is well controlled on current antihypertensive therapy and no additional changes were made.    It has been my pleasure seeing Ms.  Pricilla [name].  I will see her back in follow up in one year or on an as needed basis.    MedDATA/lmm        Past Medical History   Diagnosis Date   ??? Iron deficiency anemia 02/15/2009   ??? 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 Hypertension    ??? Hypercholesterolemia    ??? Thyroid disease      hypothyroidism   ??? Asthma    ??? Right sided sciatica    ??? Colon polyp    ??? Depression 12/18/2011   ??? OA (osteoarthritis of spine) 02/15/2009   ??? DDD (degenerative disc disease) 02/15/2009   ??? OA (osteoarthritis of spine) 02/15/2009   ??? Sleep apnea 02/15/2009     uses cpap machine   ??? History of echocardiogram 03/20/2004     EF 70%.  Borderline DDfx.  No significant valvular pathology.   ??? Thallium stress test  03/20/2004     Partially transient, mod basal & mid anterior defect most c/w artifact; mild anterior ischemia less likely.  Neg EKG on pharm stress test.   ??? Lower extremity venous duplex 10/23/2009     Left leg:  No DVT.         Current Outpatient Prescriptions   Medication Sig Dispense Refill   ??? CRESTOR 20 mg tablet take 1 tablet by mouth once daily  30 Tab  5   ??? olmesartan (BENICAR) 20 mg tablet Take 20 mg by mouth daily.       ???  SYNTHROID 175 mcg tablet take 1 tablet by mouth once daily  30 Tab  3   ??? Cholecalciferol, Vitamin D3, (VITAMIN D-3) 5,000 unit Tab Take  by mouth daily.       ??? potassium chloride (K-DUR, KLOR-CON) 20 mEq tablet Take 1 Tab by mouth daily.  30 Tab  4   ??? montelukast (SINGULAIR) 10 mg tablet Take 1 Tab by mouth nightly as needed. Indications: asthma  90 Tab  3   ??? thiamine (VITAMIN B-1) 100 mg tablet Take  by mouth daily.       ??? albuterol (PROVENTIL, VENTOLIN) 90 mcg/Actuation inhaler Take 2 Puffs by inhalation every six (6) hours as needed.           Surgical History  Past Surgical History   Procedure Date   ??? Pr neurological procedure unlisted 03-16-2009     s/p ACD & Fusion (Dr.P.Gurtner)   ??? Pr colonoscopy,diagnostic 02-2007     +polyp(tubular adenoma); Dr Adelene Idler   ??? Hx tubal ligation    ??? Hx hysterectomy    ??? Hx  hernia repair      3x       Social History   reports that she quit smoking about 8 years ago. She has never used smokeless tobacco.   reports that she does not drink alcohol.    Family History  family history includes Breast Cancer in her maternal aunt; Cancer in her maternal aunt and mother; Diabetes in her paternal uncle; Glaucoma in her son; Heart Attack in her sister; Heart Disease in her sister; Heart Surgery in her sister; Heart defect in her son; and Hypertension in her mother, paternal uncle, and sister.    Review of Systems  Except as stated above include:  Constitutional: Negative.  HEENT: Negative.   Respiratory: Negative.  Gastrointestinal: Negative.  Genitourinary: Negative.   Musculoskeletal: Negative.  Neurological: Negative.   Endocrine:  Negative  Psychiatric:  Negative  Hematologic: Negative.  Skin: Negative.    PHYSICAL EXAM  BP Readings from Last 3 Encounters:   08/26/12 130/68   08/18/12 112/70   06/06/12 138/80     Pulse Readings from Last 3 Encounters:   08/26/12 79   08/18/12 89   06/06/12 75     Wt Readings from Last 3 Encounters:   08/18/12 86.183 kg (190 lb)   06/06/12 90.266 kg (199 lb)   04/22/12 91.173 kg (201 lb)     General:  Alert and oriented to person, place, and time.  No acute distress.  Head and Neck:  No jugular venous distention or carotid bruits.  Lungs:  Clear bilaterally.  Heart:  Regular rate and rhythm.  Normal S1/S2.  No murmurs, rubs or gallops.  Abdomen:  Soft and nontender.  Extremities:  No edema.  Neurological:  Grossly normal.    Lab Results   Component Value Date/Time    Cholesterol, total 173 12/12/2010  9:41 AM    HDL Cholesterol 51 12/12/2010  9:41 AM    LDL, calculated 102 12/12/2010  9:41 AM    Triglyceride 102 12/12/2010  9:41 AM    CHOL/HDL Ratio 3.4 06/27/2009  8:49 AM       Lab Results   Component Value Date/Time    Sodium 142 04/13/2012  6:30 PM    Potassium 3.7 04/13/2012  6:30 PM    Chloride 105 04/13/2012  6:30 PM    CO2 29 04/13/2012  6:30 PM    Anion gap 8 04/13/2012  6:30 PM    Glucose 79 04/13/2012  6:30 PM    BUN 9 04/13/2012  6:30 PM    Creatinine 0.78 04/13/2012  6:30 PM    BUN/Creatinine ratio 12 04/13/2012  6:30 PM    GFR est AA >60 04/13/2012  6:30 PM    GFR est non-AA >60 04/13/2012  6:30 PM    Calcium 8.7 04/13/2012  6:30 PM    Bilirubin, total 0.5 12/12/2010  9:41 AM    ALT 12 12/12/2010  9:41 AM    AST 12 12/12/2010  9:41 AM    Alk. phosphatase 79 12/12/2010  9:41 AM    Protein, total 7.2 12/12/2010  9:41 AM    Albumin 4.5 12/12/2010  9:41 AM    Globulin 3.1 06/27/2009  8:49 AM    A-G Ratio 1.7 12/12/2010  9:41 AM     Lab Results   Component Value Date/Time    Sodium 142 04/13/2012  6:30 PM    Potassium 3.7 04/13/2012  6:30 PM    Chloride 105 04/13/2012  6:30 PM    CO2 29 04/13/2012  6:30 PM    Anion gap 8 04/13/2012  6:30 PM    Glucose 79 04/13/2012  6:30 PM    BUN 9 04/13/2012  6:30 PM    Creatinine 0.78 04/13/2012  6:30 PM    BUN/Creatinine ratio 12 04/13/2012  6:30 PM    GFR est non-AA >60 04/13/2012  6:30 PM    Calcium 8.7 04/13/2012  6:30 PM    GFR est AA >60 04/13/2012  6:30 PM      Nuclear Stress Test:  SUMMARY:   1. Presumed normal Lexiscan Cardiolite myocardial perfusion study.   2. Very mild fixed perfusion defect in the high anterior wall, with relative sparing   of the apex and intraventricular septum, which was actually better perfused   with stress and was felt to be related to soft tissue breast attenuation   with the remainder of the myocardium well perfused without signs of   ischemia or infarction.   3. Normal left ventricular volumes with very mild basal inferior wall   hypokinesia with otherwise normal wall motion and normal overall left   ventricular function, ejection fraction of 64% on gated SPECT imaging.   4. Negative pharmacologic stress test electrocardiographically.   5. This was a low risk Lexiscan Cardiolite myocardial perfusion study.

## 2012-08-30 NOTE — Telephone Encounter (Signed)
Left message for patient prescription has been sent to the pharmacy.

## 2012-11-07 NOTE — Progress Notes (Signed)
Patient here for 3 month follow up visit. Patient also complain of shooting pain in both nipples for 3-4 weeks.

## 2012-11-07 NOTE — Patient Instructions (Signed)
DASH Diet: After Your Visit  Your Care Instructions  The DASH diet is an eating plan that can help lower your blood pressure. DASH stands for Dietary Approaches to Stop Hypertension. Hypertension is high blood pressure.  The DASH diet focuses on eating foods that are high in calcium, potassium, and magnesium. These nutrients can lower blood pressure. The foods that are highest in these nutrients are fruits, vegetables, low-fat dairy products, nuts, seeds, and legumes. But taking calcium, potassium, and magnesium supplements instead of eating foods that are high in those nutrients does not have the same effect. The DASH diet also includes whole grains, fish, and poultry.  The DASH diet is one of several lifestyle changes your doctor may recommend to lower your high blood pressure. Your doctor may also want you to decrease the amount of sodium in your diet. Lowering sodium while following the DASH diet can lower blood pressure even further than just the DASH diet alone.  Follow-up care is a key part of your treatment and safety. Be sure to make and go to all appointments, and call your doctor if you are having problems. It's also a good idea to know your test results and keep a list of the medicines you take.  How can you care for yourself at home?  Following the DASH diet  ?? Eat 4 to 5 servings of fruit each day. A serving is 1 medium-sized piece of fruit, ?? cup chopped or canned fruit, 1/4 cup dried fruit, or 4 ounces (?? cup) of fruit juice. Choose fruit more often than fruit juice.  ?? Eat 4 to 5 servings of vegetables each day. A serving is 1 cup of lettuce or raw leafy vegetables, ?? cup of chopped or cooked vegetables, or 4 ounces (?? cup) of vegetable juice. Choose vegetables more often than vegetable juice.  ?? Get 2 to 3 servings of low-fat and fat-free dairy each day. A serving is 8 ounces of milk, 1 cup of yogurt, or 1 ?? ounces of cheese.  ?? Eat 6 to 8 servings of grains each day. A serving is 1 slice of  bread, 1 ounce of dry cereal, or ?? cup of cooked rice, pasta, or cooked cereal. Try to choose whole-grain products as much as possible.  ?? Limit lean meat, poultry, and fish to 2 servings each day. A serving is 3 ounces, about the size of a deck of cards.  ?? Eat 4 to 5 servings of nuts, seeds, and legumes (cooked dried beans, lentils, and split peas) each week. A serving is 1/3 cup of nuts, 2 tablespoons of seeds, or ?? cup cooked dried beans or peas.  ?? Limit fats and oils to 2 to 3 servings each day. A serving is 1 teaspoon of vegetable oil or 2 tablespoons of salad dressing.  ?? Limit sweets and added sugars to 5 servings or less a week. A serving is 1 tablespoon jelly or jam, ?? cup sorbet, or 1 cup of lemonade.  ?? Eat less than 2,300 milligrams (mg) of sodium a day. If you have high blood pressure, diabetes, or chronic kidney disease, if you are African-American, or if you are older than age 50, try to limit the amount of sodium you eat to less than 1,500 mg a day.  Tips for success  ?? Start small. Do not try to make dramatic changes to your diet all at once. You might feel that you are missing out on your favorite foods and then be more   likely to not follow the plan. Make small changes, and stick with them. Once those changes become habit, add a few more changes.  ?? Try some of the following:  ?? Make it a goal to eat a fruit or vegetable at every meal and at snacks. This will make it easy to get the recommended amount of fruits and vegetables each day.  ?? Try yogurt topped with fruit and nuts for a snack or healthy dessert.  ?? Add lettuce, tomato, cucumber, and onion to sandwiches.  ?? Combine a ready-made pizza crust with low-fat mozzarella cheese and lots of vegetable toppings. Try using tomatoes, squash, spinach, broccoli, carrots, cauliflower, and onions.  ?? Have a variety of cut-up vegetables with a low-fat dip as an appetizer instead of chips and dip.  ?? Sprinkle sunflower seeds or chopped almonds over  salads. Or try adding chopped walnuts or almonds to cooked vegetables.  ?? Try some vegetarian meals using beans and peas. Add garbanzo or kidney beans to salads. Make burritos and tacos with mashed pinto beans or black beans.    Where can you learn more?    Go to http://www.healthwise.net/[facility]   Enter H967 in the search box to learn more about "DASH Diet: After Your Visit."    ?? 2006-2013 Healthwise, Incorporated. Care instructions adapted under license by [facility] [employer] (which disclaims liability or warranty for this information). This care instruction is for use with your licensed healthcare professional. If you have questions about a medical condition or this instruction, always ask your healthcare professional. Healthwise, Incorporated disclaims any warranty or liability for your use of this information.  Content Version: 9.8.193578; Last Revised: Apr 13, 2012

## 2012-11-07 NOTE — Progress Notes (Signed)
Cc: Follow-up for Thyroid Problem, Hypertension, Cholesterol Problem and Asthma    HPI  Elaine [name] is a 61 y.o. African American female, returns today for follow up complaining of intermittent "shooting pain" in both breasts noted in the past 3 weeks with no known precipitating factor. Pain is fleeting in nature lasting for less than a second and has not change in severity. No nipple discharge.     Since her last office visit she has been taking her medications with no problem. No complaints of muscle pain or muscle weakness.    .      Patient Active Problem List   Diagnosis Code   ??? DOE (Dyspnea on Exertion) 786.09   ??? Hypothyroidism 244.9   ??? Hypercholesterolemia 272.0   ??? HTN (Hypertension), Benign 401.1   ??? GERD (Gastroesophageal Reflux Disease) 530.81   ??? Asthma 493.90   ??? Vitamin D Deficiency 268.9   ??? Iron Deficiency Anemia 280.9   ??? Hyperglycemia 790.29   ??? Submental lymphadenopathy 785.6   ??? Sciatica 724.3   ??? Depression 311         Current Outpatient Prescriptions   Medication Sig Dispense Refill   ??? SYNTHROID 175 mcg tablet Take 1 Tab by mouth Daily (before breakfast).  30 Tab  1   ??? rosuvastatin (CRESTOR) 40 mg tablet Take 1 Tab by mouth daily.  30 Tab  5   ??? olmesartan (BENICAR) 20 mg tablet Take 20 mg by mouth daily.       ??? Cholecalciferol, Vitamin D3, (VITAMIN D-3) 5,000 unit Tab Take  by mouth daily.       ??? potassium chloride (K-DUR, KLOR-CON) 20 mEq tablet Take 1 Tab by mouth daily.  30 Tab  4   ??? montelukast (SINGULAIR) 10 mg tablet Take 1 Tab by mouth nightly as needed. Indications: asthma  90 Tab  3   ??? thiamine (VITAMIN B-1) 100 mg tablet Take  by mouth daily.       ??? albuterol (PROVENTIL, VENTOLIN) 90 mcg/Actuation inhaler Take 2 Puffs by inhalation every six (6) hours as needed.         No current facility-administered medications for this visit.         Review of Systems   Constitutional: Negative for fever, chills, weight loss and malaise/fatigue.   Respiratory: Negative for cough,  shortness of breath and wheezing.    Cardiovascular: Negative for chest pain, palpitations and leg swelling.   Gastrointestinal: Negative for heartburn, nausea, vomiting, abdominal pain, blood in stool and melena.   Genitourinary: Negative for hematuria.   Neurological: Negative for dizziness, loss of consciousness and headaches.   Psychiatric/Behavioral: Negative for depression. The patient is not nervous/anxious.          Blood pressure 150/88, pulse 80, temperature 98.1 ??F (36.7 ??C), temperature source Oral, resp. rate 12, height 5\' 2"  (1.575 m), weight 197 lb (89.359 kg), SpO2 97.00%.  Physical Exam   Vitals reviewed.  Constitutional: She is oriented to person, place, and time. She appears well-developed and well-nourished. No distress.   Eyes: Conjunctivae and EOM are normal. Pupils are equal, round, and reactive to light. No scleral icterus.   Neck: Neck supple. No JVD present. Carotid bruit is not present. No thyromegaly present.   Cardiovascular: Normal rate, regular rhythm and normal heart sounds.  Exam reveals no gallop.    Pulmonary/Chest: Effort normal and breath sounds normal. No respiratory distress. She has no wheezes. She has no rales. Right breast  exhibits no mass, no nipple discharge and no skin change. Left breast exhibits no mass, no nipple discharge and no skin change. Breasts are symmetrical.   Abdominal: Soft. Bowel sounds are normal. She exhibits no distension. There is no tenderness.   Musculoskeletal: Normal range of motion. She exhibits no edema and no tenderness.   Neurological: She is alert and oriented to person, place, and time. Gait normal.   Psychiatric: She has a normal mood and affect. Her behavior is normal.       LABS on October 31, 2012 reviewed with patient.      ASSESSMENT and PLAN  1. HTN, goal below 140/90 - not to goal, needs follow up   2. Breast pain - etiology unclear, needs further observation   3. Hypercholesterolemia - improved   4. Hypothyroidism - stable   5.  Asthma - stable       Follow-up Disposition:  Return in about 1 month (around 12/08/2012) for follow up.  current treatment plan is effective, no change in therapy  lab results and schedule of future lab studies reviewed with patient  reviewed diet, exercise and weight control  Discussed the patient's BMI with her.  The BMI follow up plan is as follows: BMI is out of normal parameters and plan is as follows: I have counseled this patient on diet and exercise regimens.  cardiovascular risk and specific lipid/LDL goals reviewed  Reviewed plan of care. Patient has provided input and agrees with goals.

## 2012-11-11 NOTE — Telephone Encounter (Signed)
Medication list shows this medication had been discontinued on 08-18-2012 during her visit with Dr. Matilde Sprang (Cardiology).

## 2012-11-18 NOTE — Telephone Encounter (Signed)
Left message for patient to call the office.

## 2012-11-18 NOTE — Telephone Encounter (Signed)
Patient is requesting Losartan - HCTZ 50-12.5 mg.  Not on current medication list.

## 2012-11-18 NOTE — Telephone Encounter (Signed)
Patient states she is only taking Benicar and not Losartan HCT.  Patient states she cant remember whom started her on Benicar.

## 2012-11-18 NOTE — Telephone Encounter (Signed)
Reviewed MED LIST, it looks like Losartan HCT was discontinued on 08-18-2012 by Dr. Matilde Sprang (Cardiology). Please call patient and have him clarify this with Dr. Greig Castilla office.     I also noted in her MED LIST that she is on [facility], she should not be taking both Benicar and Losartan HCT. It is not clear who prescribed her the Benicar. Please ask her if truly she is taking BENICAR and LOSARTAN HCT. Just the same she needs to clarify with Dr. Greig Castilla office why they discontinued the Losartan HCT on Sept. 12, 2013 when she went to see him.

## 2012-12-05 NOTE — Telephone Encounter (Signed)
Pt states that she is returning nurse call. Please call her back at [phone].  Thanks

## 2012-12-05 NOTE — Telephone Encounter (Signed)
Returned call to patient she states she out BP meds.

## 2012-12-21 LAB — AMB POC URINALYSIS DIP STICK AUTO W/O MICRO
Bilirubin (UA POC): NEGATIVE
Blood (UA POC): NEGATIVE
Glucose (UA POC): NEGATIVE
Ketones (UA POC): NEGATIVE
Nitrites (UA POC): NEGATIVE
Specific gravity (UA POC): 1.02 (ref 1.001–1.035)
Urobilinogen (UA POC): 1 (ref 0.2–1)
pH (UA POC): 7 (ref 4.6–8.0)

## 2012-12-21 NOTE — Progress Notes (Signed)
Patient here for 4 week follow up visit.

## 2012-12-21 NOTE — Progress Notes (Signed)
Cc: Follow-up for Hypertension and Breast pain    HPI  Elaine [name] is a 62 y.o. African American female, returns today for f/u with no further complaints of breast pain. However, since her last office visit she had been seen in [facility] for evaluation of pelvic pain. She was diagnosed with cystitis and was prescribed Ciprofloxacin 250 mg twice daily for 3 days. Her symptoms have completely resolved and feels much better.     .  Review of Systems   Constitutional: Negative for fever and chills.   Respiratory: Negative for cough and shortness of breath.    Cardiovascular: Negative for chest pain and leg swelling.   Gastrointestinal: Negative for nausea, vomiting and diarrhea.   Genitourinary: Negative for urgency, frequency and hematuria.   Neurological: Negative for dizziness and headaches.         Patient Active Problem List   Diagnosis Code   ??? DOE (Dyspnea on Exertion) 786.09   ??? Hypothyroidism 244.9   ??? Hypercholesterolemia 272.0   ??? HTN (Hypertension), Benign 401.1   ??? GERD (Gastroesophageal Reflux Disease) 530.81   ??? Asthma 493.90   ??? Vitamin D Deficiency 268.9   ??? Iron Deficiency Anemia 280.9   ??? Hyperglycemia 790.29   ??? Submental lymphadenopathy 785.6   ??? Sciatica 724.3   ??? Depression 311         Current Outpatient Prescriptions   Medication Sig Dispense Refill   ??? CYANOCOBALAMIN, VITAMIN B-12, (VITAMIN B-12 PO) Take  by mouth.       ??? GLUCOSAMINE SULFATE (GLUCOSAMINE PO) Take  by mouth.       ??? GINSENG PO Take  by mouth.       ??? olmesartan (BENICAR) 20 mg tablet Take 1 Tab by mouth daily.  30 Tab  5   ??? SYNTHROID 175 mcg tablet TAKE 1 TABLET BY MOUTH DAILY  (BEFORE BREAKFAST)  30 Tab  1   ??? rosuvastatin (CRESTOR) 40 mg tablet Take 1 Tab by mouth daily.  30 Tab  5   ??? Cholecalciferol, Vitamin D3, (VITAMIN D-3) 5,000 unit Tab Take  by mouth daily.       ??? potassium chloride (K-DUR, KLOR-CON) 20 mEq tablet Take 1 Tab by mouth daily.  30 Tab  4   ??? montelukast (SINGULAIR) 10 mg tablet Take 1 Tab by  mouth nightly as needed. Indications: asthma  90 Tab  3   ??? albuterol (PROVENTIL, VENTOLIN) 90 mcg/Actuation inhaler Take 2 Puffs by inhalation every six (6) hours as needed.       ??? thiamine (VITAMIN B-1) 100 mg tablet Take  by mouth daily.         No current facility-administered medications for this visit.         Blood pressure 146/72, pulse 84, temperature 98.4 ??F (36.9 ??C), temperature source Oral, resp. rate 14, height 5\' 2"  (1.575 m), weight 201 lb (91.173 kg), SpO2 98.00%.  Physical Exam   Vitals reviewed.  Constitutional: She is oriented to person, place, and time. She appears well-developed and well-nourished. No distress.   Neck: Neck supple. No JVD present.   Cardiovascular: Normal rate, regular rhythm and normal heart sounds.    Pulmonary/Chest: Effort normal and breath sounds normal. No respiratory distress.   Abdominal: Soft. Bowel sounds are normal. She exhibits no distension. There is no tenderness. There is no CVA tenderness.   Musculoskeletal: She exhibits no edema.   Neurological: She is alert and oriented to person, place, and  time.         ASSESSMENT and PLAN  1. Cystitis - new, needs further evaluation AMB POC URINALYSIS DIP STICK AUTO W/O MICRO, CULTURE, URINE, CULTURE, URINE   2. HTN (hypertension) - not to goal olmesartan (BENICAR) 40 mg tablet, METABOLIC PANEL, COMPREHENSIVE       Orders Placed This Encounter   ??? CULTURE, URINE   ??? [id number] Meredith Staggers FEE   ??? [id number] - [facility] COLLECTION   ??? LIPID PANEL   ??? METABOLIC PANEL, COMPREHENSIVE   ??? TSH, 3RD GENERATION   ??? VITAMIN D, 25 HYDROXY   ??? HEMOGLOBIN A1C   ??? AMB POC URINALYSIS DIP STICK AUTO W/O MICRO   ??? olmesartan (BENICAR) 40 mg tablet       Follow-up Disposition:  Return in 2 months (on 02/18/2013) for follow up with fasting LABS.  current treatment plan is effective with the following changes in treatment are made:   increase Benicar to 40 mg daily  lab results and schedule of future lab studies reviewed with patient  reviewed  diet, exercise and weight control  Discussed the patient's BMI with her.  The BMI follow up plan is as follows: BMI is out of normal parameters and plan is as follows: I have counseled this patient on diet and exercise regimens.  cardiovascular risk and specific lipid/LDL goals reviewed  Reviewed plan of care. Patient has provided input and agrees with goals.

## 2012-12-21 NOTE — Patient Instructions (Signed)
DASH Diet: After Your Visit  Your Care Instructions  The DASH diet is an eating plan that can help lower your blood pressure. DASH stands for Dietary Approaches to Stop Hypertension. Hypertension is high blood pressure.  The DASH diet focuses on eating foods that are high in calcium, potassium, and magnesium. These nutrients can lower blood pressure. The foods that are highest in these nutrients are fruits, vegetables, low-fat dairy products, nuts, seeds, and legumes. But taking calcium, potassium, and magnesium supplements instead of eating foods that are high in those nutrients does not have the same effect. The DASH diet also includes whole grains, fish, and poultry.  The DASH diet is one of several lifestyle changes your doctor may recommend to lower your high blood pressure. Your doctor may also want you to decrease the amount of sodium in your diet. Lowering sodium while following the DASH diet can lower blood pressure even further than just the DASH diet alone.  Follow-up care is a key part of your treatment and safety. Be sure to make and go to all appointments, and call your doctor if you are having problems. It's also a good idea to know your test results and keep a list of the medicines you take.  How can you care for yourself at home?  Following the DASH diet  ?? Eat 4 to 5 servings of fruit each day. A serving is 1 medium-sized piece of fruit, ?? cup chopped or canned fruit, 1/4 cup dried fruit, or 4 ounces (?? cup) of fruit juice. Choose fruit more often than fruit juice.  ?? Eat 4 to 5 servings of vegetables each day. A serving is 1 cup of lettuce or raw leafy vegetables, ?? cup of chopped or cooked vegetables, or 4 ounces (?? cup) of vegetable juice. Choose vegetables more often than vegetable juice.  ?? Get 2 to 3 servings of low-fat and fat-free dairy each day. A serving is 8 ounces of milk, 1 cup of yogurt, or 1 ?? ounces of cheese.  ?? Eat 6 to 8 servings of grains each day. A serving is 1 slice of  bread, 1 ounce of dry cereal, or ?? cup of cooked rice, pasta, or cooked cereal. Try to choose whole-grain products as much as possible.  ?? Limit lean meat, poultry, and fish to 2 servings each day. A serving is 3 ounces, about the size of a deck of cards.  ?? Eat 4 to 5 servings of nuts, seeds, and legumes (cooked dried beans, lentils, and split peas) each week. A serving is 1/3 cup of nuts, 2 tablespoons of seeds, or ?? cup cooked dried beans or peas.  ?? Limit fats and oils to 2 to 3 servings each day. A serving is 1 teaspoon of vegetable oil or 2 tablespoons of salad dressing.  ?? Limit sweets and added sugars to 5 servings or less a week. A serving is 1 tablespoon jelly or jam, ?? cup sorbet, or 1 cup of lemonade.  ?? Eat less than 2,300 milligrams (mg) of sodium a day. If you have high blood pressure, diabetes, or chronic kidney disease, if you are African-American, or if you are older than age 50, try to limit the amount of sodium you eat to less than 1,500 mg a day.  Tips for success  ?? Start small. Do not try to make dramatic changes to your diet all at once. You might feel that you are missing out on your favorite foods and then be more   likely to not follow the plan. Make small changes, and stick with them. Once those changes become habit, add a few more changes.  ?? Try some of the following:  ?? Make it a goal to eat a fruit or vegetable at every meal and at snacks. This will make it easy to get the recommended amount of fruits and vegetables each day.  ?? Try yogurt topped with fruit and nuts for a snack or healthy dessert.  ?? Add lettuce, tomato, cucumber, and onion to sandwiches.  ?? Combine a ready-made pizza crust with low-fat mozzarella cheese and lots of vegetable toppings. Try using tomatoes, squash, spinach, broccoli, carrots, cauliflower, and onions.  ?? Have a variety of cut-up vegetables with a low-fat dip as an appetizer instead of chips and dip.  ?? Sprinkle sunflower seeds or chopped almonds over  salads. Or try adding chopped walnuts or almonds to cooked vegetables.  ?? Try some vegetarian meals using beans and peas. Add garbanzo or kidney beans to salads. Make burritos and tacos with mashed pinto beans or black beans.    Where can you learn more?    Go to http://www.healthwise.net/[facility]   Enter H967 in the search box to learn more about "DASH Diet: After Your Visit."    ?? 2006-2013 Healthwise, Inc[employer]. Care instructions adapted under license by [employer] (which disclaims liability or warranty for this information). This care instruction is for use with your licensed healthcare professional. If you have questions about a medical condition or this instruction, always ask your healthcare professional. [employer], [employer] disclaims any warranty or liability for your use of this information.  Content Version: 9.9.209917; Last Revised: Apr 13, 2012

## 2012-12-24 LAB — CULTURE, URINE: CULTURE RESULT: NORMAL

## 2013-01-11 NOTE — Telephone Encounter (Signed)
Please notify patient of recent urine culture report which showed no growth.

## 2013-01-11 NOTE — Telephone Encounter (Signed)
Pt called stating she never received a call from Dr. Randa Spike about the results from urine culture.

## 2013-01-11 NOTE — Telephone Encounter (Signed)
Patient is aware urine culture showed no growth.

## 2013-02-03 NOTE — Telephone Encounter (Signed)
Left message for patient prescription is ready for pick up at the front office.  Any questions call the office.

## 2013-02-16 LAB — METABOLIC PANEL, COMPREHENSIVE
A-G Ratio: 1.7 RATIO (ref 1.1–2.6)
AGE: 63
ALT (SGPT): 14 U/L (ref 5–40)
AST (SGOT): 15 U/L (ref 10–37)
Albumin: 4.3 G/DL (ref 3.5–5.0)
Alk. phosphatase: 78 U/L (ref 40–120)
Anion gap: 10
BUN: 6 MG/DL (ref 6–22)
Bilirubin, total: 0.6 MG/DL (ref 0.2–1.2)
CO2: 27 MMOL/L (ref 20–30)
Calcium: 9.4 MG/DL (ref 8.4–10.5)
Chloride: 100 MMOL/L (ref 98–110)
Creatinine: 0.6 MG/DL — ABNORMAL LOW (ref 0.8–1.4)
GFR CAL: >60
Globulin: 2.5 G/DL (ref 2.0–4.0)
Glucose: 132 MG/DL — ABNORMAL HIGH (ref 65–99)
Potassium: 3.6 MMOL/L (ref 3.5–5.5)
Protein, total: 6.8 G/DL (ref 6.2–8.1)
Race: 6
Sodium: 137 MMOL/L (ref 133–145)

## 2013-02-16 LAB — LIPID PANEL
Cholesterol, total: 149 MG/DL (ref 110–200)
HDL Cholesterol: 41 MG/DL (ref 40–59)
LDL, calculated: 88 MG/DL (ref 50–99)
Triglyceride: 99 MG/DL (ref 40–149)
VLDL, calculated: 20 MG/DL (ref 8–30)

## 2013-02-16 LAB — TSH 3RD GENERATION: TSH: 0.03 uU/mL — ABNORMAL LOW (ref 0.27–4.20)

## 2013-02-16 LAB — VITAMIN D, 25 HYDROXY: VITAMIN D, 25-HYDROXY: 41.8 NG/ML (ref 32.0–100.0)

## 2013-02-16 LAB — HGB A1C WITH EAG ESTIMATION
AVG GLU: 113 MG/DL (ref 91–123)
Hemoglobin A1c: 5.6 % (ref 4.8–5.9)

## 2013-02-22 NOTE — Progress Notes (Signed)
Patient here for 2 month follow up visit.

## 2013-02-22 NOTE — Patient Instructions (Signed)
Gastroesophageal Reflux Disease (GERD): After Your Visit  Your Care Instructions  Gastroesophageal reflux disease (GERD) is the backward flow of stomach acid into the esophagus. The esophagus is the tube that leads from your throat to your stomach. A one-way valve prevents the stomach acid from moving up into this tube. When you have GERD, this valve does not close tightly enough.  If you have mild GERD symptoms including heartburn, you may be able to control the problem with antacids or over-the-counter medicine. Changing your diet, losing weight, and making other lifestyle changes can also help reduce symptoms.  Follow-up care is a key part of your treatment and safety. Be sure to make and go to all appointments, and call your doctor if you are having problems. It???s also a good idea to know your test results and keep a list of the medicines you take.  How can you care for yourself at home?  ?? Take your medicines exactly as prescribed. Call your doctor if you think you are having a problem with your medicine.  ?? Your doctor may recommend over-the-counter medicine. For mild or occasional indigestion, antacids, such as Tums, Gaviscon, Mylanta, or Maalox, may help. Your doctor also may recommend over-the-counter acid reducers, such as Pepcid AC, Tagamet HB, Zantac 75, or Prilosec. Read and follow all instructions on the label. If you use these medicines often, talk with your doctor.  ?? Change your eating habits.  ?? It???s best to eat several small meals instead of two or three large meals.  ?? After you eat, wait 2 to 3 hours before you lie down.  ?? Chocolate, mint, and alcohol can make GERD worse.  ?? Spicy foods, foods that have a lot of acid (like tomatoes and oranges), and coffee can make GERD symptoms worse in some people. If your symptoms are worse after you eat a certain food, you may want to stop eating that food to see if your symptoms get better.  ?? Do not smoke or chew tobacco. Smoking can make GERD worse. If  you need help quitting, talk to your doctor about stop-smoking programs and medicines. These can increase your chances of quitting for good.  ?? If you have GERD symptoms at night, raise the head of your bed 6 to 8 inches by putting the frame on blocks or placing a foam wedge under the head of your mattress. (Adding extra pillows does not work.)  ?? Do not wear tight clothing around your middle.  ?? Lose weight if you need to. Losing just 5 to 10 pounds can help.  When should you call for help?  Call your doctor now or seek immediate medical care if:  ?? You have new or different belly pain.  ?? Your stools are black and tarlike or have streaks of blood.  Watch closely for changes in your health, and be sure to contact your doctor if:  ?? Your symptoms have not improved after 2 days.  ?? Food seems to catch in your throat or chest.   Where can you learn more?   Go to http://www.healthwise.net/[facility]  Enter T927 in the search box to learn more about "Gastroesophageal Reflux Disease (GERD): After Your Visit."   ?? 2006-2013 Healthwise, Incorporated. Care instructions adapted under license by [place] (which disclaims liability or warranty for this information). This care instruction is for use with your licensed healthcare professional. If you have questions about a medical condition or this instruction, always ask your healthcare professional. Healthwise, Incorporated disclaims   any warranty or liability for your use of this information.  Content Version: 9.9.209917; Last Revised: Apr 22, 2012

## 2013-02-22 NOTE — Progress Notes (Signed)
Cc:  Follow-up for Hypertension, Cholesterol problem, Hypothyroidism and Asthma    HPI  Elaine [name] is a 62 y.o. African American female, returns today for f/u after having increased the dose of BENICAR which she is tolerating well with no side effect. Since her last office visit she has been feeling well taking her medications with no problem. No complaints of muscle pain or muscle weakness. However, she reports of increased nasal drainage lately with dry cough having to clear her throat constantly. Throat is not sore. No fever or chills.    On review of her medications, I noted she had stopped taking Omeprazole for unclear reason.        Patient Active Problem List   Diagnosis Code   ??? DOE (Dyspnea on Exertion) 786.09   ??? Hypothyroidism 244.9   ??? Hypercholesterolemia 272.0   ??? HTN (Hypertension), Benign 401.1   ??? GERD (Gastroesophageal Reflux Disease) 530.81   ??? Asthma 493.90   ??? Vitamin D Deficiency 268.9   ??? Iron Deficiency Anemia 280.9   ??? Hyperglycemia 790.29   ??? Submental lymphadenopathy 785.6   ??? Sciatica 724.3   ??? Depression 311         Current Outpatient Prescriptions   Medication Sig Dispense Refill   ??? IRON DEXTRAN COMPLEX (IRON DEXTRAN IV) by IntraVENous route.       ??? SYNTHROID 175 mcg tablet TAKE 1 TABLET BY MOUTH DAILY  (BEFORE BREAKFAST)  30 Tab  4   ??? CYANOCOBALAMIN, VITAMIN B-12, (VITAMIN B-12 PO) Take  by mouth.       ??? GLUCOSAMINE SULFATE (GLUCOSAMINE PO) Take  by mouth.       ??? GINSENG PO Take  by mouth.       ??? olmesartan (BENICAR) 40 mg tablet Take 1 Tab by mouth daily.  30 Tab  4   ??? rosuvastatin (CRESTOR) 40 mg tablet Take 1 Tab by mouth daily.  30 Tab  5   ??? Cholecalciferol, Vitamin D3, (VITAMIN D-3) 5,000 unit Tab Take  by mouth daily.       ??? potassium chloride (K-DUR, KLOR-CON) 20 mEq tablet Take 1 Tab by mouth daily.  30 Tab  4   ??? montelukast (SINGULAIR) 10 mg tablet Take 1 Tab by mouth nightly as needed. Indications: asthma  90 Tab  3   ??? thiamine (VITAMIN B-1) 100 mg tablet  Take  by mouth daily.             Review of Systems   Constitutional: Positive for weight loss. Negative for malaise/fatigue.   HENT: Negative for ear pain, nosebleeds and ear discharge.    Respiratory: Negative for cough, shortness of breath and wheezing.    Cardiovascular: Negative for chest pain, palpitations and leg swelling.   Gastrointestinal: Negative for heartburn, nausea, vomiting, abdominal pain, blood in stool and melena.   Musculoskeletal: Negative for falls.   Neurological: Negative for dizziness, loss of consciousness and headaches.   Psychiatric/Behavioral: Negative for depression. The patient is not nervous/anxious.          Blood pressure 128/80, pulse 88, temperature 97.4 ??F (36.3 ??C), temperature source Oral, resp. rate 12, height 5\' 2"  (1.575 m), weight 195 lb (88.451 kg), SpO2 98.00%.  Physical Exam   Vitals reviewed.  Constitutional: She is oriented to person, place, and time. She appears well-developed and well-nourished. No distress.   HENT:   Nose: Nose normal. Right sinus exhibits no maxillary sinus tenderness and no frontal sinus tenderness.  Left sinus exhibits no maxillary sinus tenderness and no frontal sinus tenderness.   Mouth/Throat: Oropharynx is clear and moist. No oropharyngeal exudate.   Eyes: Conjunctivae and EOM are normal. Pupils are equal, round, and reactive to light. No scleral icterus.   Neck: Neck supple. No JVD present. Carotid bruit is not present. No thyromegaly present.   Cardiovascular: Normal rate, regular rhythm and normal heart sounds.  Exam reveals no gallop.    Pulmonary/Chest: Effort normal and breath sounds normal. No respiratory distress. She has no wheezes. She has no rales.   Abdominal: Soft. Bowel sounds are normal. She exhibits no distension. There is no tenderness.   Musculoskeletal: Normal range of motion. She exhibits no edema and no tenderness.   Lymphadenopathy:     She has no cervical adenopathy.   Neurological: She is alert and oriented to person,  place, and time. Gait normal.   Psychiatric: She has a normal mood and affect.         LABS :  Results for orders placed in visit on 02/15/13   LIPID PANEL       Result Value Range    Triglyceride 99  40 - 149 MG/DL    HDL Cholesterol 41  40 - 59 MG/DL    Cholesterol, total 045  110 - 200 MG/DL    LDL, calculated 88  50 - 99 MG/DL    VLDL, calculated 20  8 - 30 MG/DL   METABOLIC PANEL, COMPREHENSIVE       Result Value Range    Glucose 132 (*) 65 - 99 MG/DL    BUN 6  6 - 22 MG/DL    Creatinine 0.6 (*) 0.8 - 1.4 MG/DL    AGE 50      RACE 6      GFR CAL >60  >     60    Sodium 137  133 - 145 MMOL/L    Potassium 3.6  3.5 - 5.5 MMOL/L    Chloride 100  98 - 110 MMOL/L    CO2 27  20 - 30 MMOL/L    Calcium 9.4  8.4 - 10.5 MG/DL    AST 15  10 - 37 U/L    ALT 14  5 - 40 U/L    Alk. phosphatase 78  40 - 120 U/L    Bilirubin, total 0.6  0.2 - 1.2 MG/DL    Protein, total 6.8  6.2 - 8.1 G/DL    Albumin 4.3  3.5 - 5.0 G/DL    Globulin 2.5  2.0 - 4.0 G/DL    A-G Ratio 1.7  1.1 - 2.6 RATIO    Anion gap 10.0     TSH, 3RD GENERATION       Result Value Range    TSH 0.03 (*) 0.27 - 4.20 MCU/ML   VITAMIN D, 25 HYDROXY       Result Value Range    VITAMIN D, 25-HYDROXY 41.8  32.0 - 100.0 NG/ML       ASSESSMENT and PLAN    ICD-9-CM    1. Hypothyroidism - on excessive Thyroid hormone replacement 244.9 levothyroxine (SYNTHROID) 150 mcg tablet   2. GERD (gastroesophageal reflux disease) is suspected - needs follow up. 530.81 omeprazole (PRILOSEC) 40 mg capsule   3. Hypercholesterolemia - controlled 272.0    4. HTN (hypertension), benign - controlled 401.1    5. Asthma - stable 493.90        Orders Placed This Encounter   ???  levothyroxine (SYNTHROID) 150 mcg tablet   ??? omeprazole (PRILOSEC) 40 mg capsule       Follow-up Disposition:  Return in about 1 month (around 03/25/2013) for follow up .  current treatment plan is effective with the following changes in treatment are made:   decrease Synthroid to 150 mcg daily   try PRILOSEC 40 mg daily  lab  results and schedule of future lab studies reviewed with patient  reviewed diet, exercise and weight control  Discussed the patient's above normal BMI with her.  I have recommended the following interventions: dietary management education, guidance, and counseling  weight loss from baseline weight .  The BMI follow up plan is as follows: BMI is out of normal parameters and plan is as follows: I have counseled this patient on diet and exercise regimens  cardiovascular risk and specific lipid/LDL goals reviewed  reviewed medications and side effects in detail  Reviewed plan of care. Patient has provided input and agrees with goals.

## 2013-03-22 LAB — AMB POC URINALYSIS DIP STICK AUTO W/O MICRO
Bilirubin (UA POC): NEGATIVE
Blood (UA POC): NEGATIVE
Glucose (UA POC): NEGATIVE
Ketones (UA POC): NEGATIVE
Nitrites (UA POC): NEGATIVE
Protein (UA POC): NEGATIVE mg/dL
Specific gravity (UA POC): 1.01 (ref 1.001–1.035)
Urobilinogen (UA POC): 1 (ref 0.2–1)
pH (UA POC): 7 (ref 4.6–8.0)

## 2013-03-22 NOTE — Progress Notes (Signed)
Cc: Follow-up for Thyroid Problem and GERD    HPI  Elaine [name] is a 62 y.o. African American female, returns today for f/u after having increased the dose of Omeprazole with complaint of persistent dry cough with increased rhinorrhea, runny eyes and sneezing. She also complains of pain in her right lower quadrant of varying severity noted in the past 2 weeks. Pain seems to be worse at night when is lying down. No known precipitating or aggravating factors.      Review of Systems   Constitutional: Negative for fever and chills.   HENT: Negative for congestion and sore throat.    Respiratory: Negative for shortness of breath and wheezing.    Cardiovascular: Negative for chest pain, leg swelling and PND.   Gastrointestinal: Negative for heartburn, nausea, vomiting, diarrhea, blood in stool and melena.   Genitourinary: Negative for dysuria, urgency and frequency.   Neurological: Negative for headaches.         Patient Active Problem List   Diagnosis Code   ??? DOE (Dyspnea on Exertion) 786.09   ??? Hypothyroidism 244.9   ??? Hypercholesterolemia 272.0   ??? HTN (Hypertension), Benign 401.1   ??? GERD (Gastroesophageal Reflux Disease) 530.81   ??? Asthma 493.90   ??? Vitamin D Deficiency 268.9   ??? Iron Deficiency Anemia 280.9   ??? Hyperglycemia 790.29   ??? Submental lymphadenopathy 785.6   ??? Sciatica 724.3   ??? Depression 311       Current Outpatient Prescriptions   Medication Sig Dispense Refill   ??? IRON DEXTRAN COMPLEX (IRON DEXTRAN IV) by IntraVENous route.       ??? levothyroxine (SYNTHROID) 150 mcg tablet Take 1 Tab by mouth Daily (before breakfast).  30 Tab  3   ??? omeprazole (PRILOSEC) 40 mg capsule Take 1 Cap by mouth Daily (before breakfast).  30 Cap  3   ??? CYANOCOBALAMIN, VITAMIN B-12, (VITAMIN B-12 PO) Take 1 Tab by mouth daily.       ??? GLUCOSAMINE SULFATE (GLUCOSAMINE PO) Take 1 Tab by mouth daily.       ??? GINSENG PO Take 1 Tab by mouth daily.       ??? olmesartan (BENICAR) 40 mg tablet Take 1 Tab by mouth daily.  30 Tab  4    ??? rosuvastatin (CRESTOR) 40 mg tablet Take 1 Tab by mouth daily.  30 Tab  5   ??? Cholecalciferol, Vitamin D3, (VITAMIN D-3) 5,000 unit Tab Take  by mouth daily.       ??? potassium chloride (K-DUR, KLOR-CON) 20 mEq tablet Take 1 Tab by mouth daily.  30 Tab  4   ??? thiamine (VITAMIN B-1) 100 mg tablet Take  by mouth daily.             Blood pressure 128/70, pulse 97, temperature 98 ??F (36.7 ??C), temperature source Oral, resp. rate 20, height 5\' 2"  (1.575 m), weight 198 lb 8 oz (90.039 kg), SpO2 97.00%.  Physical Exam   Vitals reviewed.  Constitutional: She is oriented to person, place, and time. She appears well-developed and well-nourished. No distress.   HENT:   Nose: No mucosal edema or rhinorrhea. Right sinus exhibits no maxillary sinus tenderness and no frontal sinus tenderness. Left sinus exhibits no maxillary sinus tenderness and no frontal sinus tenderness.   Eyes: Conjunctivae are normal. Right eye exhibits no discharge. Left eye exhibits no discharge.   Neck: Neck supple. No JVD present. No thyromegaly present.   Cardiovascular: Normal rate, regular  rhythm and normal heart sounds.    Pulmonary/Chest: Effort normal. She has no wheezes. She has rhonchi. She has no rales.   Abdominal: Soft. Bowel sounds are normal. She exhibits no distension. There is no tenderness.   Musculoskeletal: She exhibits no edema and no tenderness.   Neurological: She is alert and oriented to person, place, and time.       LABS :  Results for orders placed in visit on 03/22/13   AMB POC URINALYSIS DIP STICK AUTO W/O MICRO       Result Value Range    Color (UA POC) Yellow      Clarity (UA POC) Clear      Glucose (UA POC) Negative  Negative    Bilirubin (UA POC) Negative  Negative    Ketones (UA POC) Negative  Negative    Specific gravity (UA POC) 1.010  1.001 - 1.035    Blood (UA POC) Negative  Negative    pH (UA POC) 7.0  4.6 - 8.0    Protein (UA POC) Negative  Negative mg/dL    Urobilinogen (UA POC) 1 mg/dL  0.2 - 1    Nitrites (UA  POC) Negative  Negative    Leukocyte esterase (UA POC) Trace  Negative       ASSESSMENT and PLAN    ICD-9-CM    1. Asthma - needs improvement 493.90 montelukast (SINGULAIR) 10 mg tablet     albuterol (PROVENTIL HFA) 90 mcg/actuation inhaler     budesonide-formoterol (SYMBICORT) 160-4.5 mcg/actuation HFA inhaler   2. Abdominal pain - new, etiology to be determined 789.00 AMB POC URINALYSIS DIP STICK AUTO W/O MICRO     CULTURE, URINE     CBC WITH AUTOMATED DIFF     [place] PELV NON OBS  LTD   3. GERD (gastroesophageal reflux disease) - stable 530.81    4. Hypothyroidism - needs follow up. 244.9 TSH, 3RD GENERATION       Orders Placed This Encounter   ??? CULTURE, URINE   ??? TSH, 3RD GENERATION   ??? METABOLIC PANEL, COMPREHENSIVE   ??? LIPID PANEL   ??? CBC WITH AUTOMATED DIFF   ??? [id number] - [facility] COLLECTION   ??? [id number] [facility] DRAW FEE   ??? AMB POC URINALYSIS DIP STICK AUTO W/O MICRO   ??? montelukast (SINGULAIR) 10 mg tablet   ??? albuterol (PROVENTIL HFA) 90 mcg/actuation inhaler   ??? budesonide-formoterol (SYMBICORT) 160-4.5 mcg/actuation HFA inhaler       Follow-up Disposition:  Return in about 3 months (around 06/21/2013) for follow up with fasting LABS or sooner if symptoms persist or worsen.  current treatment plan is effective, no change in therapy  lab results and schedule of future lab studies reviewed with patient  Call patient of urine culture report  reviewed medications and side effects in detail  Reviewed plan of care. Patient has provided input and agrees with goals.

## 2013-03-22 NOTE — Patient Instructions (Signed)
Abdominal Pain: After Your Visit  Your Care Instructions  Abdominal pain has many possible causes. Some aren't serious and get better on their own in a few days. Others need more testing and treatment. If your pain continues or gets worse, you need to be rechecked and may need more tests to find out what is wrong. You may need surgery to correct the problem.  Don't ignore new symptoms, such as fever, nausea and vomiting, urination problems, pain that gets worse, and dizziness. These may be signs of a more serious problem.  Your doctor may have recommended a follow-up visit in the next 8 to 12 hours. If you are not getting better, you may need more tests or treatment.  The doctor has checked you carefully, but problems can develop later. If you notice any problems or new symptoms, get medical treatment right away.  Follow-up care is a key part of your treatment and safety. Be sure to make and go to all appointments, and call your doctor if you are having problems. It's also a good idea to know your test results and keep a list of the medicines you take.  How can you care for yourself at home?  ?? Rest until you feel better.  ?? To prevent dehydration, drink plenty of fluids, enough so that your urine is light yellow or clear like water. Choose water and other caffeine-free clear liquids until you feel better. If you have kidney, heart, or liver disease and have to limit fluids, talk with your doctor before you increase the amount of fluids you drink.  ?? If your stomach is upset, eat mild foods, such as rice, dry toast or crackers, bananas, and applesauce. Try eating several small meals instead of two or three large ones.  ?? Wait until 48 hours after all symptoms have gone away before you have spicy foods, alcohol, and drinks that contain caffeine.  ?? Do not eat foods that are high in fat.  ?? Avoid anti-inflammatory medicines such as aspirin, ibuprofen (Advil, Motrin), and naproxen (Aleve). These can cause stomach  upset. Talk to your doctor if you take daily aspirin for another health problem.  When should you call for help?  Call 911 anytime you think you may need emergency care. For example, call if:  ?? You passed out (lost consciousness).  ?? You pass maroon or very bloody stools.  ?? You vomit blood or what looks like coffee grounds.  ?? You have new, severe belly pain.  Call your doctor now or seek immediate medical care if:  ?? Your pain gets worse, especially if it becomes focused in one area of your belly.  ?? You have a new or higher fever.  ?? Your stools are black and look like tar, or they have streaks of blood.  ?? You have unexpected vaginal bleeding.  ?? You have symptoms of a urinary tract infection. These may include:  ?? Pain when you urinate.  ?? Urinating more often than usual.  ?? Blood in your urine.  ?? You are dizzy or lightheaded, or you feel like you may faint.  Watch closely for changes in your health, and be sure to contact your doctor if:  ?? You are not getting better after 1 day (24 hours).   Where can you learn more?   Go to http://www.healthwise.net/[facility]  Enter E907 in the search box to learn more about "Abdominal Pain: After Your Visit."   ?? 2006-2014 Healthwise, Incorporated. Care instructions adapted under license   by [place] (which disclaims liability or warranty for this information). This care instruction is for use with your licensed healthcare professional. If you have questions about a medical condition or this instruction, always ask your healthcare professional. Healthwise, Incorporated disclaims any warranty or liability for your use of this information.  Content Version: 10.0.270728; Last Revised: March 30, 2011

## 2013-03-22 NOTE — Progress Notes (Signed)
Patient is in the office today for 1 month follow up.  Patient states she has some abdominal pain and she also needs to have a prescription for an inhaler for her cough.

## 2013-03-24 ENCOUNTER — Encounter

## 2013-03-24 LAB — CULTURE, URINE: CULTURE RESULT: NORMAL

## 2013-03-24 NOTE — Progress Notes (Signed)
Quick Note:    Please notify patient of recent urine culture report which is normal.      ______

## 2013-03-27 NOTE — Telephone Encounter (Signed)
Message copied by Bjorn Loser on Mon Mar 27, 2013  9:48 AM  ------       Message from: Barbie Haggis D       Created: Fri Mar 24, 2013 10:35 PM         Please notify patient of recent urine culture report which is normal.                ------

## 2013-03-27 NOTE — Telephone Encounter (Signed)
Patient called and notified that recent urine culture report was normal.

## 2013-03-27 NOTE — Progress Notes (Signed)
Quick Note:    Please notify patient of recent U/S (pelvis) report which is normal.    ______

## 2013-03-28 NOTE — Telephone Encounter (Signed)
Message copied by Bjorn Loser on Tue Mar 28, 2013  5:18 PM  ------       Message from: Barbie Haggis D       Created: Mon Mar 27, 2013  5:58 PM         Please notify patient of recent U/S (pelvis) report which is normal.         ------

## 2013-03-28 NOTE — Telephone Encounter (Signed)
Patient called and made aware that her recent U/S to pelvis was normal

## 2013-04-18 NOTE — Telephone Encounter (Signed)
Verbal order per KEVIN E ZAWACKI, MD

## 2013-10-02 NOTE — Patient Instructions (Addendum)
A Healthy Lifestyle: After Your Visit  Your Care Instructions  A healthy lifestyle can help you feel good, stay at a healthy weight, and have plenty of energy for both work and play. A healthy lifestyle is something you can share with your whole family.  A healthy lifestyle also can lower your risk for serious health problems, such as high blood pressure, heart disease, and diabetes.  You can follow a few steps listed below to improve your health and the health of your family.  Follow-up care is a key part of your treatment and safety. Be sure to make and go to all appointments, and call your doctor if you are having problems. It???s also a good idea to know your test results and keep a list of the medicines you take.  How can you care for yourself at home?  ?? Do not eat too much sugar, fat, or fast foods. You can still have dessert and treats now and then. The goal is moderation.  ?? Start small to improve your eating habits. Pay attention to portion sizes, drink less juice and soda pop, and eat more fruits and vegetables.  ?? Eat a healthy amount of food. A 3-ounce serving of meat, for example, is about the size of a deck of cards. Fill the rest of your plate with vegetables and whole grains.  ?? Limit the amount of soda and sports drinks you have every day. Drink more water when you are thirsty.  ?? Eat at least 5 servings of fruits and vegetables every day. It may seem like a lot, but it is not hard to reach this goal. A serving or helping is 1 piece of fruit, 1 cup of vegetables, or 2 cups of leafy, raw vegetables. Have an apple or some carrot sticks as an afternoon snack instead of a candy bar. Try to have fruits and/or vegetables at every meal.  ?? Make exercise part of your daily routine. You may want to start with simple activities, such as walking, bicycling, or slow swimming. Try to be active 30 to 60 minutes every day. You do not need to do all 30 to 60 minutes all at once. For example, you can exercise 3  times a day for 10 or 20 minutes. Moderate exercise is safe for most people, but it is always a good idea to talk to your doctor before starting an exercise program.  ?? Keep moving. Mow the lawn, work in the garden, or clean your house. Take the stairs instead of the elevator at work.  ?? If you smoke, quit. People who smoke have an increased risk for heart attack, stroke, cancer, and other lung illnesses. Quitting is hard, but there are ways to boost your chance of quitting tobacco for good.  ?? Use nicotine gum, patches, or lozenges.  ?? Ask your doctor about stop-smoking programs and medicines.  ?? Keep trying.  In addition to reducing your risk of diseases in the future, you will notice some benefits soon after you stop using tobacco. If you have shortness of breath or asthma symptoms, they will likely get better within a few weeks after you quit.  ?? Limit how much alcohol you drink. Moderate amounts of alcohol (up to 2 drinks a day for men, 1 drink a day for women) are okay. But drinking too much can lead to liver problems, high blood pressure, and other health problems.  Family health  If you have a family, there are many things you can   do together to improve your health.  ?? Eat meals together as a family as often as possible.  ?? Eat healthy foods. This includes fruits, vegetables, lean meats and dairy, and whole grains.  ?? Include your family in your fitness plan. Most people think of activities such as jogging or tennis as the way to fitness, but there are many ways you and your family can be more active. Anything that makes you breathe hard and gets your heart pumping is exercise. Here are some tips:  ?? Walk to do errands or to take your child to school or the bus.  ?? Go for a family bike ride after dinner instead of watching TV.   Where can you learn more?   Go to [employer]  Enter [phone] in the search box to learn more about "A Healthy Lifestyle: After Your Visit."   ?? 2006-2014  [employer]. Care instructions adapted under license by [employer] (which disclaims liability or warranty for this information). This care instruction is for use with your licensed healthcare professional. If you have questions about a medical condition or this instruction, always ask your healthcare professional. [employer] disclaims any warranty or liability for your use of this information.  Content Version: 10.1.311062; Current as of: January 26, 2012              Meralgia Paresthetica: After Your Visit  Your Care Instructions  Meralgia paresthetica (say "muh-RAL-juh par-uhs-THET-ick-uh") is pain and numbness in the outer part of your thigh. The pain might get worse after you walk or stand for a long time.  This pain and numbness occur when a nerve in your thigh is pinched (compressed). Sometimes the problem is caused by wearing tight clothing or being overweight.  Most of the time the problem goes away on its own in a few months. Lowering any pressure on the thigh area may help. Wear loose clothes, and lose weight if you need to.  Follow-up care is a key part of your treatment and safety. Be sure to make and go to all appointments, and call your doctor if you are having problems. It's also a good idea to know your test results and keep a list of the medicines you take.  How can you care for yourself at home?  ?? Most times the problem gets better on its own. Try wearing loose clothing to see if this helps.  ?? Lose weight if you need to. Talk with your doctor if you need help.  When should you call for help?  Watch closely for changes in your health, and be sure to contact your doctor if:  ?? You have new symptoms, such as pain that gets worse or new numbness in your thigh.  ?? You do not get better as expected.   Where can you learn more?   Go to [employer]  Enter C468 in the search box to learn more about "Meralgia Paresthetica: After Your Visit."   ??  2006-2014 [employer]. Care instructions adapted under license by [employer] (which disclaims liability or warranty for this information). This care instruction is for use with your licensed healthcare professional. If you have questions about a medical condition or this instruction, always ask your healthcare professional. [employer] disclaims any warranty or liability for your use of this information.  Content Version: 10.1.311062; Current as of: February 15, 2013

## 2013-10-02 NOTE — Progress Notes (Signed)
Patient here for a skin problem follow up. Patient complains of discomfort to right thigh area.

## 2013-10-02 NOTE — Progress Notes (Signed)
Cc: Skin Problem    HPI  Elaine [name] is a 62 y.o.  African American  female who missed previously scheduled follow up 3 months ago presents on sick visit complaining of pain on the side of her right thigh noted in the past 2 months. Pain is described as sharp noted mostly when she lies on her right side. No rash. She has chronic low back pain which has not changed. No leg pain, numbness, tingling or muscle weakness. She took OTC pain medication with temporary relief.       Patient Active Problem List   Diagnosis Code   ??? DOE (Dyspnea on Exertion) 786.09   ??? Hypothyroidism 244.9   ??? Hypercholesterolemia 272.0   ??? HTN (Hypertension), Benign 401.1   ??? GERD (Gastroesophageal Reflux Disease) 530.81   ??? Asthma 493.90   ??? Vitamin D Deficiency 268.9   ??? Iron Deficiency Anemia 280.9   ??? Hyperglycemia 790.29   ??? Submental lymphadenopathy 785.6   ??? Sciatica 724.3   ??? Depression 311           Review of Systems   Constitutional: Negative for fever and chills.   HENT: Negative for neck pain.    Gastrointestinal: Negative for heartburn, nausea, vomiting and abdominal pain.   Musculoskeletal: Negative for falls.         Blood pressure 128/80, pulse 88, temperature 98.6 ??F (37 ??C), temperature source Oral, resp. rate 20, height 5\' 2"  (1.575 m), weight 192 lb (87.091 kg), SpO2 99.00%.  Physical Exam   Vitals reviewed.  Constitutional: She appears well-developed and well-nourished. No distress.   Abdominal: Soft. Bowel sounds are normal. She exhibits no distension. There is no tenderness.   Musculoskeletal: She exhibits no edema and no tenderness.        Right hip: She exhibits normal range of motion, normal strength and no tenderness.        Lumbar back: She exhibits normal range of motion, no tenderness, no pain and no spasm.        Right upper leg: She exhibits tenderness. She exhibits no edema and no deformity.        Legs:  Negative SLR bilaterally   Neurological: Coordination and gait normal.   Reflex Scores:       Patellar  reflexes are 1+ on the right side and 1+ on the left side.  Skin: Skin is warm and dry. No rash noted. No erythema.         ASSESSMENT and PLAN    ICD-9-CM    1. Meralgia paraesthetica, right - new 355.1 XR HIP RT AP/LAT MIN 2 V     gabapentin (NEURONTIN) 300 mg capsule   2. HTN (hypertension), benign - controlled 401.1    3. Need for prophylactic vaccination and inoculation against influenza V04.81 INFLUENZA VIRUS VACCINE, SPLIT, PRES. FREE, IN INDIVIDS. >=3 YRS OF AGE, IM       Orders Placed This Encounter   ??? XR HIP RT AP/LAT MIN 2 V   ??? INFLUENZA VIRUS VACCINE, SPLIT, PRES. FREE, IN INDIVIDS. >=3 YRS OF AGE, IM   ??? VARICELLA ZOSTER ABS, IGG/IGM   ??? gabapentin (NEURONTIN) 300 mg capsule       Follow-up Disposition:  Return in 2 weeks (on 10/16/2013) for follow up with fasting LABS.  current treatment plan is effective, no change in therapy  Refill Gabapentin 300 mg at bedtime and may increase to 300 mg twice daily if no improvement  lab results and  schedule of future lab studies reviewed with patient  reviewed diet, exercise and weight control  Discussed the patient's above normal BMI with her.  I have recommended the following interventions: encourage exercise  lifestyle education regarding diet  monitor weight .    reviewed medications and side effects in detail  radiology results and schedule of future radiology studies reviewed with patient  Reviewed plan of care. Patient has provided input and agrees with goals.

## 2013-10-07 LAB — CBC WITH AUTOMATED DIFF
ABS. BASOPHILS: 0 10*3/uL (ref 0.0–0.2)
ABS. EOSINOPHILS: 0.2 10*3/uL (ref 0.0–0.5)
ABS. MONOCYTES: 0.4 10*3/uL (ref 0.1–0.9)
ABS. NEUTROPHILS: 4.3 10*3/uL (ref 1.8–7.7)
ABSOLUTE LYMPHOCYTE COUNT: 1 10*3/uL (ref 1.0–4.8)
BASOPHILS: 1 % (ref 0–2)
EOSINOPHILS: 3 % (ref 0–6)
HCT: 36.9 % (ref 35.1–48.0)
HGB: 11.6 g/dL — ABNORMAL LOW (ref 11.7–16.0)
Lymphocytes: 17 % — ABNORMAL LOW (ref 27–45)
MCH: 26 pg (ref 26–34)
MCHC: 31 g/dL — ABNORMAL LOW (ref 32–36)
MCV: 84 fL (ref 80–95)
MONOCYTES: 6 % (ref 3–9)
MPV: 11.4 fL — ABNORMAL HIGH (ref 6.0–10.8)
NEUTROPHILS: 74 % (ref 40–75)
PLATELET: 275 10*3/uL (ref 140–440)
RBC: 4.41 M/uL (ref 3.80–5.20)
RDW: 14.8 % (ref 10.0–16.0)
WBC: 5.9 10*3/uL (ref 4.0–11.0)

## 2013-10-07 LAB — METABOLIC PANEL, COMPREHENSIVE
A-G Ratio: 1.9 ratio (ref 1.1–2.6)
ALT (SGPT): 11 U/L (ref 5–40)
AST (SGOT): 15 U/L (ref 10–37)
Albumin: 4.6 g/dL (ref 3.5–5.0)
Alk. phosphatase: 70 U/L (ref 40–120)
Anion gap: 15 mmol/L
BUN: 9 mg/dL (ref 6–22)
Bilirubin, total: 0.3 mg/dL (ref 0.2–1.2)
CO2: 25 mmol/L (ref 20–32)
Calcium: 9.9 mg/dL (ref 8.4–10.5)
Chloride: 101 mmol/L (ref 98–110)
Creatinine: 0.5 mg/dL — ABNORMAL LOW (ref 0.8–1.4)
GFR CAL: >60 (ref >60.0)
Globulin: 2.4 g/dL (ref 2.0–4.0)
Glucose: 103 mg/dL — ABNORMAL HIGH (ref 65–99)
Potassium: 4 mmol/L (ref 3.5–5.5)
Protein, total: 7 g/dL (ref 6.2–8.1)
Sodium: 141 mmol/L (ref 133–145)

## 2013-10-07 LAB — LIPID PANEL
Cholesterol, total: 176 mg/dL (ref 110–200)
HDL Cholesterol: 58 mg/dL (ref 40–59)
LDL, calculated: 104 mg/dL — ABNORMAL HIGH (ref 50–99)
Triglyceride: 68 mg/dL (ref 40–149)
VLDL, calculated: 14 mg/dL (ref 8–30)

## 2013-10-07 LAB — TSH 3RD GENERATION: TSH: 0.78 uU/mL (ref 0.27–4.20)

## 2013-10-09 NOTE — Addendum Note (Signed)
Addended by: Wonda Olds on: 10/09/2013 04:11 PM     Modules accepted: Level of Service

## 2013-10-13 LAB — VARICELLA-ZOSTER V AB, IGG: VARICELLA ZOSTER IGG: 4.9 AI (ref >=1.1)

## 2013-10-13 LAB — VZV AB, IGM: Varicella Ab, IgM: <0.91 index (ref 0.00–0.90)

## 2013-10-17 NOTE — Progress Notes (Signed)
Patient in the office for a 2 week follow up. Patient is not taking Ginseng and she is taking Vitamin D3 500units.     1. Have you been to the ER, urgent care clinic since your last visit?  Hospitalized since your last visit?No    2. Have you seen or consulted any other health care providers outside of the [facility] since your last visit?  Include any pap smears or colon screening. No

## 2013-10-17 NOTE — Progress Notes (Signed)
Cc: Follow-up for Meralgia paresthetica, Hypertension, Cholesterol problem and Hypothyroidism    HPI  Elaine [name] is a 62 y.o. African American female, returns today for follow up with no specific complaint. Pain in her right thigh is better on Neurontin 300 mg 2 tabs at bedtime. She comes in today requesting refill of her medications. Since her last office visit she has been feeling well taking her medications with no problem. No complaints of muscle pain or muscle weakness.    .      Patient Active Problem List   Diagnosis Code   ??? DOE (Dyspnea on Exertion) 786.09   ??? Hypothyroidism 244.9   ??? Hypercholesterolemia 272.0   ??? HTN (Hypertension), Benign 401.1   ??? GERD (Gastroesophageal Reflux Disease) 530.81   ??? Asthma 493.90   ??? Vitamin D Deficiency 268.9   ??? Iron Deficiency Anemia 280.9   ??? Hyperglycemia 790.29   ??? Submental lymphadenopathy 785.6   ??? Sciatica 724.3   ??? Depression 311         Current Outpatient Prescriptions   Medication Sig Dispense Refill   ??? gabapentin (NEURONTIN) 300 mg capsule Take 1 capsule by mouth two (2) times a day.  60 capsule  1   ??? SYNTHROID 150 mcg tablet TAKE 1 TABLET BY MOUTH DAILY BEFORE BREAKFAST  30 tablet  0   ??? omeprazole (PRILOSEC) 40 mg capsule TAKE 1 CAPSULE BY MOUTH DAILY BEFORE BREAKFAST  30 Cap  3   ??? BENICAR 40 mg tablet take 1 tablet by mouth once daily  30 Tab  5   ??? CRESTOR 40 mg tablet take 1 tablet by mouth once daily  30 Tab  5   ??? montelukast (SINGULAIR) 10 mg tablet Take 1 Tab by mouth nightly as needed. Indications: asthma  90 Tab  3   ??? albuterol (PROVENTIL HFA) 90 mcg/actuation inhaler Take 2 Puffs by inhalation every six (6) hours as needed for Wheezing.  1 Inhaler  5   ??? budesonide-formoterol (SYMBICORT) 160-4.5 mcg/actuation HFA inhaler Take 2 Puffs by inhalation two (2) times a day.  1 Inhaler  5   ??? IRON DEXTRAN COMPLEX (IRON DEXTRAN IV) by IntraVENous route.       ??? CYANOCOBALAMIN, VITAMIN B-12, (VITAMIN B-12 PO) Take 1 Tab by mouth daily.       ???  GLUCOSAMINE SULFATE (GLUCOSAMINE PO) Take 1 Tab by mouth daily.       ??? Cholecalciferol, Vitamin D3, (VITAMIN D-3) 5,000 unit Tab Take  by mouth daily.       ??? thiamine (VITAMIN B-1) 100 mg tablet Take  by mouth daily.             Review of Systems   Constitutional: Positive for weight loss. Negative for malaise/fatigue.   Respiratory: Negative for cough, shortness of breath and wheezing.    Cardiovascular: Negative for chest pain, palpitations and leg swelling.   Gastrointestinal: Negative for heartburn, nausea, vomiting, abdominal pain, blood in stool and melena.   Musculoskeletal: Negative for falls.   Neurological: Negative for dizziness, loss of consciousness and headaches.   Psychiatric/Behavioral: Negative for depression. The patient is not nervous/anxious.          Blood pressure 130/60, pulse 78, temperature 98.2 ??F (36.8 ??C), temperature source Oral, resp. rate 16, height 5\' 2"  (1.575 m), weight 192 lb (87.091 kg), SpO2 98.00%.  Physical Exam   Vitals reviewed.  Constitutional: She is oriented to person, place, and time. She appears well-developed  and well-nourished. No distress.   Eyes: Conjunctivae and EOM are normal. Pupils are equal, round, and reactive to light. No scleral icterus.   Neck: Neck supple. No JVD present. Carotid bruit is not present. No thyromegaly present.   Cardiovascular: Normal rate, regular rhythm and normal heart sounds.  Exam reveals no gallop.    Pulmonary/Chest: Effort normal and breath sounds normal. No respiratory distress. She has no wheezes. She has no rales.   Abdominal: Soft. Bowel sounds are normal. She exhibits no distension. There is no tenderness.   Musculoskeletal: Normal range of motion. She exhibits no edema and no tenderness.   Lymphadenopathy:     She has no cervical adenopathy.   Neurological: She is alert and oriented to person, place, and time. Gait normal.   Psychiatric: She has a normal mood and affect.       LABS :  Results for orders placed in visit on  10/06/13   METABOLIC PANEL, COMPREHENSIVE       Result Value Range    Potassium 4.0  3.5 - 5.5 mmol/L    Sodium 141  133 - 145 mmol/L    Chloride 101  98 - 110 mmol/L    Glucose 103 (*) 65 - 99 mg/dL    Calcium 9.9  8.4 - 04.5 mg/dL    Albumin 4.6  3.5 - 5.0 g/dL    ALT 11  5 - 40 U/L    AST 15  10 - 37 U/L    Bilirubin, total 0.3  0.2 - 1.2 mg/dL    Alk. phosphatase 70  40 - 120 U/L    BUN 9  6 - 22 mg/dL    CO2 25  20 - 32 mmol/L    Creatinine 0.5 (*) 0.8 - 1.4 mg/dL    GFR CAL >40.9  >81.1    Globulin 2.4  2.0 - 4.0 g/dL    A-G Ratio 1.9  1.1 - 2.6 ratio    Protein, total 7.0  6.2 - 8.1 g/dL    Anion gap 91.4     LIPID PANEL       Result Value Range    HDL Cholesterol 58  40 - 59 mg/dL    Cholesterol, total 782  110 - 200 mg/dL    Triglyceride 68  40 - 149 mg/dL    LDL, calculated 956 (*) 50 - 99 mg/dL    VLDL, calculated 14  8 - 30 mg/dL   CBC WITH AUTOMATED DIFF       Result Value Range    WBC 5.9  4.0 - 11.0 K/uL    RBC 4.41  3.80 - 5.20 M/uL    RDW 14.8  10.0 - 16.0 %    MPV 11.4 (*) 6.0 - 10.8 fL    HGB 11.6 (*) 11.7 - 16.0 g/dL    HCT 21.3  08.6 - 57.8 %    MCV 84  80 - 95 fL    MCH 26  26 - 34 pg    MCHC 31 (*) 32 - 36 g/dL    PLATELET 469  629 - 440 K/uL    NEUTROPHILS 74  40 - 75 %    Lymphocytes 17 (*) 27 - 45 %    MONOCYTES 6  3 - 9 %    EOSINOPHILS 3  0 - 6 %    BASOPHILS 1  0 - 2 %    ABS. NEUTROPHILS 4.3  1.8 - 7.7 K/uL  ABSOLUTE LYMPHOCYTE COUNT 1.0  1.0 - 4.8 K/uL    ABS. MONOCYTES 0.4  0.1 - 0.9 K/uL    ABS. EOSINOPHILS 0.2  0.0 - 0.5 K/uL    ABS. BASOPHILS 0.0  0.0 - 0.2 K/uL   TSH, 3RD GENERATION       Result Value Range    TSH 0.78  0.27 - 4.20 mcU/mL     Component      Latest Ref Rng 10/06/2013 10/06/2013           9:34 AM  9:34 AM   Varicella Ab, IgM      0.00 - 0.90 index <0.91    VARICELLA ZOSTER IGG      >=1.1 AI  4.9       ASSESSMENT and PLAN    ICD-9-CM    1. Meralgia paraesthetica, right - improved 355.1 gabapentin (NEURONTIN) 300 mg capsule     VITAMIN B12   2. Hypercholesterolemia  - borderline controlled,  needs to follow diet more regularly  272.0 rosuvastatin (CRESTOR) 40 mg tablet     LIPID PANEL   3. HTN (hypertension), benign - well controlled 401.1 olmesartan (BENICAR) 40 mg tablet     METABOLIC PANEL, COMPREHENSIVE   4. Hypothyroidism - stable 244.9 levothyroxine (SYNTHROID) 150 mcg tablet     TSH, 3RD GENERATION   5. Need for diphtheria-tetanus-pertussis (Tdap) vaccine V06.1 TETANUS, DIPHTHERIA TOXOIDS AND ACELLULAR PERTUSSIS VACCINE (TDAP), IN INDIVIDS. >=7, IM       Orders Placed This Encounter   ??? TETANUS, DIPHTHERIA TOXOIDS AND ACELLULAR PERTUSSIS VACCINE (TDAP), IN INDIVIDS. >=7, IM   ??? LIPID PANEL   ??? METABOLIC PANEL, COMPREHENSIVE   ??? CBC WITH AUTOMATED DIFF   ??? HEMOGLOBIN A1C   ??? TSH, 3RD GENERATION   ??? VITAMIN D, 25 HYDROXY   ??? VITAMIN B12   ??? olmesartan (BENICAR) 40 mg tablet   ??? levothyroxine (SYNTHROID) 150 mcg tablet   ??? gabapentin (NEURONTIN) 300 mg capsule   ??? rosuvastatin (CRESTOR) 40 mg tablet       Follow-up Disposition:  Return in about 6 months (around 04/16/2014) for follow up (Dr. Gwen Her) with fasting LABS.  current treatment plan is effective, no change in therapy  lab results and schedule of future lab studies reviewed with patient  reviewed diet, exercise and weight control  Discussed the patient's above normal BMI with her.  I have recommended the following interventions: encourage exercise  lifestyle education regarding diet  monitor weight .   cardiovascular risk and specific lipid/LDL goals reviewed  Reviewed plan of care. Patient has provided input and agrees with goals.

## 2013-10-17 NOTE — Progress Notes (Signed)
Administered Tdap to left deltoid, tolerated well with no complaints noted.

## 2013-10-17 NOTE — Patient Instructions (Addendum)
Blood Glucose: About This Test  What is it?  A blood glucose test measures the amount of a type of sugar, called glucose, in your blood. Several different types of blood glucose tests are used.  ?? Fasting blood sugar (FBS) measures blood glucose after you have not eaten for at least 8 hours. It is often the first test done to check for prediabetes and diabetes.  ?? Random blood sugar (RBS) measures blood glucose regardless of when you last ate.  ?? Oral glucose tolerance test may be used to diagnose prediabetes and diabetes. This test is a series of blood glucose measurements taken after you drink a sweet liquid that contains glucose. This test is mostly used for pregnant women to check for gestational diabetes.  Why is this test done?  Blood glucose tests are done to:  ?? Check for diabetes. The American Diabetes Association says that a normal fasting blood sugar level is between 70-99 mg/dL. If your test results are between 100 mg/dL and 125 mg/dL, you have prediabetes. This means that your blood sugar is above normal. If your test result is 126 mg/dL or higher, you will have another test on another today to confirm that you have diabetes.  ?? See how well treatment for diabetes is working.  How can you prepare for the test?  Be sure to tell your doctor about all the nonprescription and prescription medicines and herbs or other supplements you take. There are many medicines and supplements that can affect the results of these tests.  Fasting blood sugar (FBS)   For a fasting blood sugar test, do not eat or drink anything other than water for at least 8 hours before the blood sample is taken.  If you have diabetes, you may be asked to wait until you have had your blood tested before taking your morning dose of insulin or diabetes medicine.  Random blood sugar (RBS)   No special preparation is needed before having a random blood sugar test.  Oral glucose tolerance test   In the 3 days before this test, eat a  well-balanced diet that contains at least 150 grams of carbohydrate a day. Good foods to eat are fruits, breads, cereals, grains, rice, crackers, potatoes, beans, and corn.  In the 8 hours right before the test, do not eat or drink anything (except water). Do not smoke, drink alcohol, or exercise.  What happens during the test?  A health professional takes a sample of your blood.  For a home glucose test, you may have a blood sample taken from your fingertip. This is done by:  ?? Pricking your finger with a small needle (lancet) to collect a drop of blood.  ?? Placing the blood on a special test strip.  ?? Putting the test strip into a device called a blood glucose meter.  For the oral glucose tolerance test:  ?? A blood sample will be taken when you arrive. This is your fasting blood glucose value. It will be compared with other samples during the day.  ?? You will drink a sweet glucose liquid.  ?? Depending on the reason for this test, blood samples may be taken 1, 2, or 3 hours after you drink the glucose. Blood samples may also be taken as soon as 30 minutes after you drink the glucose.  What happens after the test?  ?? You will probably be able to go home right away.  ?? You can go back to your usual activities right away.    When should you call for help?  Watch closely for changes in your health, and be sure to contact your doctor if you have any problems.  Follow-up care is a key part of your treatment and safety. Be sure to make and go to all appointments, and call your doctor if you are having problems. It's also a good idea to keep a list of the medicines you take. Ask your doctor when you can expect to have your test results.   Where can you learn more?   Go to [employer]  Enter G279 in the search box to learn more about "Blood Glucose: About This Test."   ?? 2006-2014 Healthwise, Incorporated. Care instructions adapted under license by [employer] (which disclaims liability or warranty  for this information). This care instruction is for use with your licensed healthcare professional. If you have questions about a medical condition or this instruction, always ask your healthcare professional. Healthwise, Incorporated disclaims any warranty or liability for your use of this information.  Content Version: 10.2.346038; Current as of: May 10, 2013              Meralgia Paresthetica: After Your Visit  Your Care Instructions  Meralgia paresthetica (say "muh-RAL-juh par-uhs-THET-ick-uh") is pain and numbness in the outer part of your thigh. The pain might get worse after you walk or stand for a long time.  This pain and numbness occur when a nerve in your thigh is pinched (compressed). Sometimes the problem is caused by wearing tight clothing or being overweight.  Most of the time the problem goes away on its own in a few months. Lowering any pressure on the thigh area may help. Wear loose clothes, and lose weight if you need to.  Follow-up care is a key part of your treatment and safety. Be sure to make and go to all appointments, and call your doctor if you are having problems. It's also a good idea to know your test results and keep a list of the medicines you take.  How can you care for yourself at home?  ?? Most times the problem gets better on its own. Try wearing loose clothing to see if this helps.  ?? Lose weight if you need to. Talk with your doctor if you need help.  When should you call for help?  Watch closely for changes in your health, and be sure to contact your doctor if:  ?? You have new symptoms, such as pain that gets worse or new numbness in your thigh.  ?? You do not get better as expected.   Where can you learn more?   Go to [employer]  Enter C468 in the search box to learn more about "Meralgia Paresthetica: After Your Visit."   ?? 2006-2014 [employer]wise, Inc[employer]. Care instructions adapted under license by [employer] (which disclaims liability or warranty  for this information). This care instruction is for use with your licensed healthcare professional. If you have questions about a medical condition or this instruction, always ask your healthcare professional. Healthwise, Incorporated disclaims any warranty or liability for your use of this information.  Content Version: 10.2.346038; Current as of: February 15, 2013

## 2013-10-23 NOTE — Addendum Note (Signed)
Addended by: Wonda Olds on: 10/23/2013 02:37 PM     Modules accepted: Level of Service

## 2013-10-27 NOTE — Telephone Encounter (Signed)
Left message on patient's personal voicemail that she can pick up documentation stated she had the flu.

## 2013-10-27 NOTE — Telephone Encounter (Signed)
Patient request flu shot documentation for her visit    Needs it for her job. Needs by 11/09/2013

## 2013-12-01 ENCOUNTER — Emergency Department (HOSPITAL_COMMUNITY): Payer: 59

## 2013-12-01 ENCOUNTER — Emergency Department (HOSPITAL_COMMUNITY)
Admission: EM | Admit: 2013-12-01 | Discharge: 2013-12-01 | Disposition: A | Discharge status: Home / Self Care | Payer: 59 | Attending: Emergency Medicine | Admitting: Emergency Medicine

## 2013-12-01 ENCOUNTER — Other Ambulatory Visit: Payer: Self-pay

## 2013-12-01 ENCOUNTER — Encounter (HOSPITAL_COMMUNITY): Payer: Self-pay | Admitting: Emergency Medicine

## 2013-12-01 DIAGNOSIS — Z79899 Other long term (current) drug therapy: Secondary | ICD-10-CM | POA: Insufficient documentation

## 2013-12-01 DIAGNOSIS — R112 Nausea with vomiting, unspecified: Secondary | ICD-10-CM | POA: Insufficient documentation

## 2013-12-01 DIAGNOSIS — K8020 Calculus of gallbladder without cholecystitis without obstruction: Secondary | ICD-10-CM | POA: Insufficient documentation

## 2013-12-01 DIAGNOSIS — R1013 Epigastric pain: Secondary | ICD-10-CM | POA: Insufficient documentation

## 2013-12-01 DIAGNOSIS — K219 Gastro-esophageal reflux disease without esophagitis: Secondary | ICD-10-CM | POA: Insufficient documentation

## 2013-12-01 LAB — CBC
Hemoglobin: 12.4 g/dL (ref 12.0–15.0)
MCH: 30.9 pg (ref 26.0–34.0)
MCHC: 33.4 g/dL (ref 30.0–36.0)
Platelets: 267 10*3/uL (ref 150–400)
RDW: 13.9 % (ref 11.5–15.5)

## 2013-12-01 LAB — COMPREHENSIVE METABOLIC PANEL
ALT: 24 U/L (ref 0–35)
AST: 25 U/L (ref 0–37)
Albumin: 4 g/dL (ref 3.5–5.2)
Alkaline Phosphatase: 75 U/L (ref 39–117)
Potassium: 3.7 mEq/L (ref 3.5–5.1)
Sodium: 142 mEq/L (ref 135–145)
Total Protein: 7.8 g/dL (ref 6.0–8.3)

## 2013-12-01 LAB — LIPASE, BLOOD: Lipase: 36 U/L (ref 11–59)

## 2013-12-01 MED ORDER — GI COCKTAIL ~~LOC~~
30.0000 mL | Freq: Once | ORAL | Status: AC
  Administered 2013-12-01: 30 mL via ORAL
  Filled 2013-12-01: qty 30

## 2013-12-01 MED ORDER — ONDANSETRON HCL 4 MG/2ML IJ SOLN
4.0000 mg | Freq: Once | INTRAMUSCULAR | Status: AC
  Administered 2013-12-01: 4 mg via INTRAVENOUS
  Filled 2013-12-01: qty 2

## 2013-12-01 MED ORDER — DICYCLOMINE HCL 10 MG/ML IM SOLN
20.0000 mg | Freq: Once | INTRAMUSCULAR | Status: AC
  Administered 2013-12-01: 20 mg via INTRAMUSCULAR
  Filled 2013-12-01: qty 2

## 2013-12-01 MED ORDER — ONDANSETRON HCL 4 MG PO TABS: 4.0000 mg | ORAL_TABLET | Freq: Four times a day (QID) | ORAL | Status: DC

## 2013-12-01 MED ORDER — FAMOTIDINE IN NACL 20-0.9 MG/50ML-% IV SOLN
20.0000 mg | Freq: Once | INTRAVENOUS | Status: AC
  Administered 2013-12-01: 20 mg via INTRAVENOUS
  Filled 2013-12-01: qty 50

## 2013-12-01 MED ORDER — HYDROCODONE-ACETAMINOPHEN 5-325 MG PO TABS: 1.0000 | ORAL_TABLET | ORAL | Status: DC | PRN

## 2013-12-01 NOTE — ED Provider Notes (Signed)
8:22 AM  Assumed care from Dr. Lavella Lemons.  Patient is a 62 year old female who presents emergency department with postprandial epigastric pain. She has a leukocytosis of 11. LFTs, lipase unremarkable. Troponin negative. She is a prior history of abdominal surgeries. She has a right upper quadrant ultrasound pending.   8:33 AM  [name] right upper quadrant ultrasound shows cholelithiasis without signs of cholecystitis or biliary obstruction.  Patient reports feeling much better. Her abdominal exam is benign. She is able to tolerate by mouth. We'll discharge home with return precautions, outpatient surgery followup. Patient verbalizes understanding and is comfortable plan.  Layla Maw Ward, DO 12/01/13 [phone]

## 2013-12-01 NOTE — ED Notes (Signed)
Patient woke up having acid refux about 0120 went to er

## 2013-12-01 NOTE — ED Notes (Signed)
Patient transported to X-ray 

## 2013-12-01 NOTE — ED Notes (Signed)
Patient exam by dr of which pt been having c/os of epigastric pain, ER doctor aware

## 2013-12-01 NOTE — ED Notes (Signed)
Pt denied chest pain.

## 2013-12-01 NOTE — ED Provider Notes (Signed)
CSN: [id number]     Arrival date & time 12/01/13  0319 History   First MD Initiated Contact with Patient 12/01/13 [phone]     Chief Complaint  Patient presents with  . Heartburn   (Consider location/radiation/quality/duration/timing/severity/associated sxs/prior Treatment) HPI Patient is a woman in her 37s with a history of GERD. She presents with complaints of severe epigastric pain which awoke her from sleep at 0130 and has been persistent since then. She describes the pain as burning and describes it as constant, nonradiating, severe. She says she has had this same pain, with same severity of sx, many times before and has had several ED visits to [facility] in [place].   She ate a piece of chocolate cake just before going to bed last night and believes that this caused her symptoms -  GERD flare up. She has been nauseated but has not vomited. She has no history of abdominal surgeries. She had a normal well formed BM < 24 hrs ago. She denies chest pain and SOB.   No past medical history on file. No past surgical history on file. No family history on file. History  Substance Use Topics  . Smoking status: Not on file  . Smokeless tobacco: Not on file  . Alcohol Use: Not on file   OB History   No data available     Review of Systems Ten point review of symptoms performed and is negative with the exception of symptoms noted above.  Allergies  Morphine and related  Home Medications   Current Outpatient Rx  Name  Route  Sig  Dispense  Refill  . allopurinol (ZYLOPRIM) 100 MG tablet   Oral   Take 100 mg by mouth daily.         . Alum & Mag Hydroxide-Simeth (ANTACID LIQUID PO)   Oral   Take 10 mLs by mouth daily as needed (for acid relief).         . Cholecalciferol (VITAMIN D PO)   Oral   Take 1 tablet by mouth daily.         [name] lisinopril (PRINIVIL,ZESTRIL) 10 MG tablet   Oral   Take 10 mg by mouth daily.         [name] omeprazole (PRILOSEC) 20 MG capsule   Oral   Take 20  mg by mouth daily.         [name] OVER THE COUNTER MEDICATION   Oral   Take 1 tablet by mouth daily. Cholesterol  Off Plus          BP 161/85  Pulse 90  Temp(Src) 98.4 F (36.9 C) (Oral)  Resp 16  SpO2 100% Physical Exam Gen: well developed and well nourished appearing, appears mildly uncomfortable Head: NCAT Eyes: PERL, EOMI Nose: no epistaixis or rhinorrhea Mouth/throat: mucosa is moist and pink Neck: supple, no stridor Lungs: CTA B, no wheezing, rhonchi or rales CV: RRR, no murmur, extremities appear well perfused.  Abd: soft, obese, notender, nondistended Back: no ttp, no cva ttp Skin: warm and dry Ext: normal to inspection, no dependent edema Neuro: CN ii-xii grossly intact, no focal deficits Psyche; normal affect,  calm and cooperative.  ED Course  Procedures (including critical care time) Labs Review Results for orders placed during the hospital encounter of 12/01/13 (from the past 24 hour(s))  LIPASE, BLOOD     Status: None   Collection Time    12/01/13  4:27 AM      Result Value Range  Lipase 36  11 - 59 U/L  CBC     Status: Abnormal   Collection Time    12/01/13  4:27 AM      Result Value Range   WBC 10.8 (*) 4.0 - 10.5 K/uL   RBC 4.01  3.87 - 5.11 MIL/uL   Hemoglobin 12.4  12.0 - 15.0 g/dL   HCT 16.1  09.6 - 04.5 %   MCV 92.5  78.0 - 100.0 fL   MCH 30.9  26.0 - 34.0 pg   MCHC 33.4  30.0 - 36.0 g/dL   RDW 40.9  81.1 - 91.4 %   Platelets 267  150 - 400 K/uL  COMPREHENSIVE METABOLIC PANEL     Status: Abnormal   Collection Time    12/01/13  4:27 AM      Result Value Range   Sodium 142  135 - 145 mEq/L   Potassium 3.7  3.5 - 5.1 mEq/L   Chloride 103  96 - 112 mEq/L   CO2 27  19 - 32 mEq/L   Glucose, Bld 172 (*) 70 - 99 mg/dL   BUN 13  6 - 23 mg/dL   Creatinine, Ser 7.82 (*) 0.50 - 1.10 mg/dL   Calcium 9.5  8.4 - 95.6 mg/dL   Total Protein 7.8  6.0 - 8.3 g/dL   Albumin 4.0  3.5 - 5.2 g/dL   AST 25  0 - 37 U/L   ALT 24  0 - 35 U/L   Alkaline  Phosphatase 75  39 - 117 U/L   Total Bilirubin 0.3  0.3 - 1.2 mg/dL   GFR calc non Af Amer 52 (*) >90 mL/min   GFR calc Af Amer 60 (*) >90 mL/min  POCT I-STAT TROPONIN I     Status: None   Collection Time    12/01/13  4:36 AM      Result Value Range   Troponin i, poc 0.00  0.00 - 0.08 ng/mL   Comment 3            DG Chest 2 View (Final result)  Result time: 12/01/13 04:54:34    Final result by Rad Results In Interface (12/01/13 04:54:34)    Narrative:   CLINICAL DATA: Severe heartburn and abdominal pain.  EXAM: CHEST 2 VIEW  COMPARISON: None.  FINDINGS: The lungs are relatively well-aerated and clear. There is no evidence of focal opacification, pleural effusion or pneumothorax.  The heart is normal in size; the mediastinal contour is within normal limits. No acute osseous abnormalities are seen.  IMPRESSION: No acute cardiopulmonary process seen.   Electronically Signed By: Roanna Raider M.D. On: 12/01/2013 04:54   EKG: NSR with rate 86 bpm, normal intervals, normal axis, no STEMI, normal ST T intervals.   MDM  DDX: ACS, esophagitis, gastritis, PUD, GERD, gallbladder disease, colitis, ibs, ibd.   Patient has persistent epigastric pain with midline ttp. Notes that this is a recurrent problem. Query biliary colic v. Early cholecystitis.  Normal lipase rules out pancreatitis. EKG and troponin are wnl as are LFTs.  We will order a GB U/S for further evaluation.   Case signed out to Dr. Elesa Massed at the change of shift. She will follow up on results of study and disposition accordingly.     Brandt Loosen, MD 12/01/13 [phone]

## 2013-12-01 NOTE — ED Notes (Signed)
MD at bedside. 

## 2013-12-25 LAB — AMB POC RAPID STREP A: Group A Strep Ag: NEGATIVE

## 2013-12-25 NOTE — Progress Notes (Signed)
Cc: Sore Throat    HPI  Elaine [name] is a 63 y.o. African American  female, presents on sick visit complaining of her throat feels sore and swollen in the past month with no pain or difficulty swallowing. No fever or chills. She also complains of increased post-nasal drainage and cough productive of clear expectorate which comes and goes. She feels hoarse "at times". She has been taking Day Quill and NyQuill with temporary relief. She complains of feeling tired all the time and her hair is thinning, concerned about her thyroid. She also complains of dizziness when she first gets up and with changes in position. She drinks 1-2 glasses of water a day.      Review of Systems   Constitutional: Positive for malaise/fatigue. Negative for weight loss.   HENT: Negative for ear pain, nosebleeds, neck pain and ear discharge.    Respiratory: Negative for hemoptysis, shortness of breath and wheezing.    Cardiovascular: Negative for chest pain, palpitations, leg swelling and PND.   Gastrointestinal: Negative for heartburn, nausea, vomiting, abdominal pain and diarrhea.   Musculoskeletal: Negative for falls.   Neurological: Positive for headaches. Negative for loss of consciousness.   Psychiatric/Behavioral: Negative for depression. The patient is not nervous/anxious and does not have insomnia.        Patient Active Problem List   Diagnosis Code   ??? DOE (Dyspnea on Exertion) 786.09   ??? Hypothyroidism 244.9   ??? Hypercholesterolemia 272.0   ??? HTN (Hypertension), Benign 401.1   ??? GERD (Gastroesophageal Reflux Disease) 530.81   ??? Asthma 493.90   ??? Vitamin D Deficiency 268.9   ??? Iron Deficiency Anemia 280.9   ??? Hyperglycemia 790.29   ??? Submental lymphadenopathy 785.6   ??? Sciatica 724.3   ??? Depression 311   ??? Meralgia paraesthetica 355.1         Current Outpatient Prescriptions   Medication Sig Dispense Refill   ??? olmesartan (BENICAR) 40 mg tablet take 1 tablet by mouth once daily  30 tablet  5   ??? levothyroxine (SYNTHROID) 150 mcg  tablet TAKE 1 TABLET BY MOUTH DAILY BEFORE BREAKFAST  30 tablet  5   ??? gabapentin (NEURONTIN) 300 mg capsule Take 2 capsules at bedtime and 1 capsule in the morning.  90 capsule  3   ??? rosuvastatin (CRESTOR) 40 mg tablet take 1 tablet by mouth once daily  30 tablet  5   ??? omeprazole (PRILOSEC) 40 mg capsule TAKE 1 CAPSULE BY MOUTH DAILY BEFORE BREAKFAST  30 Cap  3   ??? montelukast (SINGULAIR) 10 mg tablet Take 1 Tab by mouth nightly as needed. Indications: asthma  90 Tab  3   ??? albuterol (PROVENTIL HFA) 90 mcg/actuation inhaler Take 2 Puffs by inhalation every six (6) hours as needed for Wheezing.  1 Inhaler  5   ??? budesonide-formoterol (SYMBICORT) 160-4.5 mcg/actuation HFA inhaler Take 2 Puffs by inhalation two (2) times a day.  1 Inhaler  5   ??? IRON DEXTRAN COMPLEX (IRON DEXTRAN IV) by IntraVENous route.       ??? CYANOCOBALAMIN, VITAMIN B-12, (VITAMIN B-12 PO) Take 1 Tab by mouth daily.       ??? GLUCOSAMINE SULFATE (GLUCOSAMINE PO) Take 1 Tab by mouth daily.       ??? Cholecalciferol, Vitamin D3, (VITAMIN D-3) 5,000 unit Tab Take  by mouth daily.       ??? thiamine (VITAMIN B-1) 100 mg tablet Take  by mouth daily.  Blood pressure 136/74, pulse 89, temperature 98.2 ??F (36.8 ??C), temperature source Oral, resp. rate 16, height 5\' 2"  (1.575 m), weight 192 lb (87.091 kg), SpO2 98.00%.   Physical Exam   Vitals reviewed.  Constitutional: She is oriented to person, place, and time. She appears well-developed and well-nourished. No distress.   HENT:   Right Ear: Tympanic membrane, external ear and ear canal normal.   Left Ear: Tympanic membrane and ear canal normal.   Nose: Nose normal. Right sinus exhibits no maxillary sinus tenderness and no frontal sinus tenderness. Left sinus exhibits no maxillary sinus tenderness and no frontal sinus tenderness.   Mouth/Throat: Oropharynx is clear and moist. No oropharyngeal exudate, posterior oropharyngeal edema or posterior oropharyngeal erythema.   Eyes: Conjunctivae and EOM  are normal. Pupils are equal, round, and reactive to light. No scleral icterus.   Neck: Neck supple. No thyromegaly present.   Cardiovascular: Normal rate, regular rhythm and normal heart sounds.    Pulmonary/Chest: Effort normal and breath sounds normal. No respiratory distress. She has no wheezes. She has no rales.   Abdominal: Soft. Bowel sounds are normal. She exhibits no distension. There is no tenderness.   Musculoskeletal: She exhibits no edema.   Neurological: She is alert and oriented to person, place, and time. Gait normal.   Psychiatric: She has a normal mood and affect. Her behavior is normal.       LABS :  Results for orders placed in visit on 12/25/13   AMB POC RAPID STREP A       Result Value Range    VALID INTERNAL CONTROL POC Yes      Group A Strep Ag Negative  Negative       ASSESSMENT and PLAN    ICD-9-CM    1. Sore throat - needs further evaluation/management 462 AMB POC RAPID STREP A     REFERRAL TO ENT-OTOLARYNGOLOGY   2. Fatigue - etiology to be determined 780.79 CBC WITH AUTOMATED DIFF   3. Postural dizziness 780.4    4. Cough  786.2 XR CHEST PA LAT   5. Hypothyroidism - needs follow up. 244.9 TSH, 3RD GENERATION   6. HTN (hypertension), benign - stable 161.0 METABOLIC PANEL, COMPREHENSIVE       Orders Placed This Encounter   ??? XR CHEST PA LAT   ??? CBC WITH AUTOMATED DIFF   ??? METABOLIC PANEL, COMPREHENSIVE   ??? TSH, 3RD GENERATION   ??? [id number] Raleigh Callas FEE   ??? [id number] - [facility] COLLECTION   ??? REFERRAL TO ENT-OTOLARYNGOLOGY   ??? AMB POC RAPID STREP A       Follow-up Disposition:  Return if symptoms worsen or fail to improve.  current treatment plan is effective, no change in therapy  lab results and schedule of future lab studies reviewed with patient  reviewed diet, exercise and weight control  Discussed the patient's above normal BMI with her.  I have recommended the following interventions: dietary management education, guidance, and counseling  encourage exercise  lifestyle education  regarding diet  monitor weight .    Encouraged patient to increase p.o.fluid intake.  radiology results and schedule of future radiology studies reviewed with patient  Reviewed plan of care. Patient has provided input and agrees with goals.

## 2013-12-25 NOTE — Progress Notes (Signed)
Patient in the office today for a complaint of sore throat. Patient is sneezing with a productive cough of clear mucus. She is also complaining of fatigue with dzziness.     1. Have you been to the ER, urgent care clinic since your last visit?  Hospitalized since your last visit? No    2. Have you seen or consulted any other health care providers outside of the [facility] since your last visit?  Include any pap smears or colon screening.  No

## 2013-12-25 NOTE — Patient Instructions (Signed)
Cough: After Your Visit  Your Care Instructions  A cough is your body's response to something that bothers your throat or airways. Many things can cause a cough. You might cough because of a cold or the flu, bronchitis, or asthma. Smoking, postnasal drip, allergies, and stomach acid that backs up into your throat also can cause coughs.  A cough is a symptom, not a disease. Most coughs stop when the cause, such as a cold, goes away. You can take a few steps at home to cough less and feel better.  Follow-up care is a key part of your treatment and safety. Be sure to make and go to all appointments, and call your doctor if you are having problems. It's also a good idea to know your test results and keep a list of the medicines you take.  How can you care for yourself at home?  ?? Drink lots of water and other fluids. This helps thin the mucus and soothes a dry or sore throat. Honey or lemon juice in hot water or tea may ease a dry cough.  ?? Take cough medicine as directed by your doctor.  ?? Prop up your head on pillows to help you breathe and ease a dry cough.  ?? Try cough drops to soothe a dry or sore throat. Cough drops don't stop a cough. Medicine-flavored cough drops are no better than candy-flavored drops or hard candy.  ?? Do not smoke. Avoid secondhand smoke. If you need help quitting, talk to your doctor about stop-smoking programs and medicines. These can increase your chances of quitting for good.  When should you call for help?  Call 911 anytime you think you may need emergency care. For example, call if:  ?? You have severe trouble breathing.  Call your doctor now or seek immediate medical care if:  ?? You cough up blood.  ?? You have new or worse trouble breathing.  ?? You have a new or higher fever.  ?? You have a new rash.  Watch closely for changes in your health, and be sure to contact your doctor if:  ?? You cough more deeply or more often, especially if you notice more mucus or a change in the color of  your mucus.  ?? You have new symptoms, such as a sore throat, an earache, or sinus pain.  ?? You do not get better as expected.   Where can you learn more?   Go to http://www.healthwise.net/[facility]  Enter D279 in the search box to learn more about "Cough: After Your Visit."   ?? 2006-2014 Healthwise, Incorporated. Care instructions adapted under license by [employer] (which disclaims liability or warranty for this information). This care instruction is for use with your licensed healthcare professional. If you have questions about a medical condition or this instruction, always ask your healthcare professional. Healthwise, Incorporated disclaims any warranty or liability for your use of this information.  Content Version: 10.2.346038; Current as of: May 10, 2013

## 2013-12-26 LAB — METABOLIC PANEL, COMPREHENSIVE
A-G Ratio: 1.7 (ref 1.1–2.5)
ALT (SGPT): 9 IU/L (ref 0–32)
AST (SGOT): 14 IU/L (ref 0–40)
Albumin: 4.3 g/dL (ref 3.6–4.8)
Alk. phosphatase: 72 IU/L (ref 39–117)
BUN/Creatinine ratio: 11 (ref 11–26)
BUN: 7 mg/dL — ABNORMAL LOW (ref 8–27)
Bilirubin, total: 0.3 mg/dL (ref 0.0–1.2)
CO2: 24 mmol/L (ref 18–29)
Calcium: 9.7 mg/dL (ref 8.7–10.3)
Chloride: 102 mmol/L (ref 97–108)
Creatinine: 0.61 mg/dL (ref 0.57–1.00)
GFR est AA: 112 mL/min/{1.73_m2} (ref >59)
GFR est non-AA: 97 mL/min/{1.73_m2} (ref >59)
GLOBULIN, TOTAL: 2.5 g/dL (ref 1.5–4.5)
Glucose: 79 mg/dL (ref 65–99)
Potassium: 3.9 mmol/L (ref 3.5–5.2)
Protein, total: 6.8 g/dL (ref 6.0–8.5)
Sodium: 142 mmol/L (ref 134–144)

## 2013-12-26 LAB — CBC WITH AUTOMATED DIFF
ABS. BASOPHILS: 0 10*3/uL (ref 0.0–0.2)
ABS. EOSINOPHILS: 0.3 10*3/uL (ref 0.0–0.4)
ABS. IMM. GRANS.: 0 10*3/uL (ref 0.0–0.1)
ABS. MONOCYTES: 0.4 10*3/uL (ref 0.1–0.9)
ABS. NEUTROPHILS: 3.5 10*3/uL (ref 1.4–7.0)
Abs Lymphocytes: 1 10*3/uL (ref 0.7–3.1)
BASOPHILS: 1 %
EOSINOPHILS: 5 %
HCT: 34.1 % (ref 34.0–46.6)
HGB: 11.2 g/dL (ref 11.1–15.9)
IMMATURE GRANULOCYTES: 0 %
Lymphocytes: 18 %
MCH: 26 pg — ABNORMAL LOW (ref 26.6–33.0)
MCHC: 32.8 g/dL (ref 31.5–35.7)
MCV: 79 fL (ref 79–97)
MONOCYTES: 8 %
NEUTROPHILS: 68 %
PLATELET: 268 10*3/uL (ref 150–379)
RBC: 4.31 x10E6/uL (ref 3.77–5.28)
RDW: 15 % (ref 12.3–15.4)
WBC: 5.2 10*3/uL (ref 3.4–10.8)

## 2013-12-26 LAB — TSH 3RD GENERATION: TSH: 13.03 u[IU]/mL — ABNORMAL HIGH (ref 0.450–4.500)

## 2013-12-26 NOTE — Telephone Encounter (Signed)
Called patient and discussed recent LAB report and noted elevated TSH level. She admits to have missed taking Synthroid daily as prescribed. I encouraged patient to take Synthroid everyday in an empty stomach and re-emphasized the importance of consistency in taking her medications. She understood and agreed not to change the dose.    I noted that 2 months ago her TSH level was perfectly within therapeutic range.

## 2014-01-15 ENCOUNTER — Telehealth

## 2014-01-15 NOTE — Telephone Encounter (Signed)
Pt had chest xray to be done and it expired. Please reorder chest xray for pt.     Pt transferred care to Dr. Doreatha Martin, former pt of Dr. Shea Evans

## 2014-01-15 NOTE — Telephone Encounter (Signed)
CXR ordered.

## 2014-01-17 NOTE — Telephone Encounter (Signed)
Lattie Haw from [facility] calling for leslie to clarify rx instructions Please return call to [phone]

## 2014-01-17 NOTE — Telephone Encounter (Signed)
Spoke with [employer] rep. He states that we need to fax Benicar correspondence to pt's pharmacy for fastest turn around since this is not a life threatening matter. Pt has tried a failed on other related medications.

## 2014-01-17 NOTE — Telephone Encounter (Signed)
Spoke with Lattie Haw from [employer].

## 2014-03-08 ENCOUNTER — Encounter

## 2014-03-08 MED ORDER — VALSARTAN 160 MG TAB
160 mg | ORAL_TABLET | Freq: Every day | ORAL | Status: DC
Start: 2014-03-08 — End: 2014-05-10

## 2014-03-08 NOTE — Telephone Encounter (Signed)
Will see if insurance will pay for Diovan

## 2014-03-08 NOTE — Telephone Encounter (Signed)
Pt requesting Medication but was told it requires prior auth . Pt wants to see if there are any other medications requires. Last seen  12/2013. Please all when resolved. Thanks

## 2014-03-08 NOTE — Telephone Encounter (Signed)
Left message on patient's personal voicemail that we will see if insurance will pay for Diovan.

## 2014-04-09 ENCOUNTER — Encounter

## 2014-04-09 MED ORDER — GABAPENTIN 300 MG CAP
300 mg | ORAL_CAPSULE | ORAL | Status: DC
Start: 2014-04-09 — End: 2014-10-02

## 2014-04-09 NOTE — Telephone Encounter (Signed)
Left message on patient's personal voicemail that medication refill was sent to the pharmacy.

## 2014-04-09 NOTE — Telephone Encounter (Signed)
Last OV 12/25/2013

## 2014-04-09 NOTE — Telephone Encounter (Signed)
Gabapentin refilled and sent to pharmacy. Please notify pt.

## 2014-04-10 NOTE — Progress Notes (Signed)
No show

## 2014-04-12 ENCOUNTER — Encounter

## 2014-04-12 MED ORDER — MONTELUKAST 10 MG TAB
10 mg | ORAL_TABLET | Freq: Every evening | ORAL | Status: DC | PRN
Start: 2014-04-12 — End: 2014-12-24

## 2014-04-12 NOTE — Telephone Encounter (Signed)
Singulair refilled and sent to pharmacy. Please notify pt.

## 2014-04-12 NOTE — Telephone Encounter (Signed)
Left message for patient that her medication refill was sent to the pharmacy.

## 2014-04-12 NOTE — Telephone Encounter (Signed)
Last OV 12/25/2013

## 2014-05-10 ENCOUNTER — Encounter

## 2014-05-10 MED ORDER — VALSARTAN 160 MG TAB
160 mg | ORAL_TABLET | ORAL | Status: DC
Start: 2014-05-10 — End: 2014-07-09

## 2014-05-10 NOTE — Telephone Encounter (Signed)
[facility] office to inform patient that medication was refilled and sent to the pharmacy.

## 2014-05-10 NOTE — Telephone Encounter (Signed)
Diovan refilled and sent to pharmacy. Please notify pt.

## 2014-06-05 ENCOUNTER — Encounter

## 2014-06-05 MED ORDER — LEVOTHYROXINE 150 MCG TAB
150 mcg | ORAL_TABLET | ORAL | Status: DC
Start: 2014-06-05 — End: 2014-08-02

## 2014-06-05 MED ORDER — ROSUVASTATIN 40 MG TAB
40 mg | ORAL_TABLET | ORAL | Status: DC
Start: 2014-06-05 — End: 2014-07-09

## 2014-06-05 NOTE — Telephone Encounter (Signed)
Last OV 12/25/13  No future appts scheduled

## 2014-07-02 NOTE — Progress Notes (Signed)
Lab drawn

## 2014-07-03 LAB — METABOLIC PANEL, COMPREHENSIVE
A-G Ratio: 1.8 (ref 1.1–2.5)
ALT (SGPT): 12 IU/L (ref 0–32)
AST (SGOT): 14 IU/L (ref 0–40)
Albumin: 4.4 g/dL (ref 3.6–4.8)
Alk. phosphatase: 84 IU/L (ref 39–117)
BUN/Creatinine ratio: 14 (ref 11–26)
BUN: 8 mg/dL (ref 8–27)
Bilirubin, total: 0.6 mg/dL (ref 0.0–1.2)
CO2: 25 mmol/L (ref 18–29)
Calcium: 9.5 mg/dL (ref 8.7–10.3)
Chloride: 98 mmol/L (ref 97–108)
Creatinine: 0.59 mg/dL (ref 0.57–1.00)
GFR est AA: 112 mL/min/{1.73_m2} (ref >59)
GFR est non-AA: 97 mL/min/{1.73_m2} (ref >59)
GLOBULIN, TOTAL: 2.5 g/dL (ref 1.5–4.5)
Glucose: 103 mg/dL — ABNORMAL HIGH (ref 65–99)
Potassium: 4.3 mmol/L (ref 3.5–5.2)
Protein, total: 6.9 g/dL (ref 6.0–8.5)
Sodium: 139 mmol/L (ref 134–144)

## 2014-07-03 LAB — CVD REPORT

## 2014-07-03 LAB — CBC WITH AUTOMATED DIFF
ABS. BASOPHILS: 0 10*3/uL (ref 0.0–0.2)
ABS. EOSINOPHILS: 0.2 10*3/uL (ref 0.0–0.4)
ABS. IMM. GRANS.: 0 10*3/uL (ref 0.0–0.1)
ABS. MONOCYTES: 0.3 10*3/uL (ref 0.1–0.9)
ABS. NEUTROPHILS: 3.9 10*3/uL (ref 1.4–7.0)
Abs Lymphocytes: 1.2 10*3/uL (ref 0.7–3.1)
BASOPHILS: 1 %
EOSINOPHILS: 3 %
HCT: 36.3 % (ref 34.0–46.6)
HGB: 11.3 g/dL (ref 11.1–15.9)
IMMATURE GRANULOCYTES: 0 %
Lymphocytes: 21 %
MCH: 23.8 pg — ABNORMAL LOW (ref 26.6–33.0)
MCHC: 31.1 g/dL — ABNORMAL LOW (ref 31.5–35.7)
MCV: 76 fL — ABNORMAL LOW (ref 79–97)
MONOCYTES: 6 %
NEUTROPHILS: 69 %
PLATELET: 271 10*3/uL (ref 150–379)
RBC: 4.75 x10E6/uL (ref 3.77–5.28)
RDW: 16.7 % — ABNORMAL HIGH (ref 12.3–15.4)
WBC: 5.6 10*3/uL (ref 3.4–10.8)

## 2014-07-03 LAB — VITAMIN B12: Vitamin B12: 1512 pg/mL — ABNORMAL HIGH (ref 211–946)

## 2014-07-03 LAB — LIPID PANEL
Cholesterol, total: 174 mg/dL (ref 100–199)
HDL Cholesterol: 58 mg/dL (ref >39)
LDL, calculated: 97 mg/dL (ref 0–99)
Triglyceride: 94 mg/dL (ref 0–149)
VLDL, calculated: 19 mg/dL (ref 5–40)

## 2014-07-03 LAB — TSH 3RD GENERATION: TSH: 0.216 u[IU]/mL — ABNORMAL LOW (ref 0.450–4.500)

## 2014-07-03 LAB — VITAMIN D, 25 HYDROXY: VITAMIN D, 25-HYDROXY: 48.4 ng/mL (ref 30.0–100.0)

## 2014-07-03 LAB — HEMOGLOBIN A1C WITH EAG: Hemoglobin A1c: 6.4 % — ABNORMAL HIGH (ref 4.8–5.6)

## 2014-07-09 ENCOUNTER — Encounter

## 2014-07-09 LAB — CARDIAC PANEL,(CK, CKMB & TROPONIN)
CK - MB: 0.5 ng/ml (ref 0.5–3.6)
CK-MB Index: 0.4 % (ref 0.0–4.0)
CK: 122 U/L (ref 26–192)
Troponin-I, QT: <0.02 NG/ML (ref 0.0–0.045)

## 2014-07-09 LAB — CBC WITH AUTOMATED DIFF
ABS. BASOPHILS: 0 10*3/uL (ref 0.0–0.06)
ABS. EOSINOPHILS: 0.1 10*3/uL (ref 0.0–0.4)
ABS. LYMPHOCYTES: 1.2 10*3/uL (ref 0.9–3.6)
ABS. MONOCYTES: 0.5 10*3/uL (ref 0.05–1.2)
ABS. NEUTROPHILS: 2.8 10*3/uL (ref 1.8–8.0)
BASOPHILS: 1 % (ref 0–2)
EOSINOPHILS: 3 % (ref 0–5)
HCT: 36.6 % (ref 35.0–45.0)
HGB: 11.6 g/dL — ABNORMAL LOW (ref 12.0–16.0)
LYMPHOCYTES: 25 % (ref 21–52)
MCH: 24 PG (ref 24.0–34.0)
MCHC: 31.7 g/dL (ref 31.0–37.0)
MCV: 75.6 FL (ref 74.0–97.0)
MONOCYTES: 10 % (ref 3–10)
MPV: 10.2 FL (ref 9.2–11.8)
NEUTROPHILS: 61 % (ref 40–73)
PLATELET: 234 10*3/uL (ref 135–420)
RBC: 4.84 M/uL (ref 4.20–5.30)
RDW: 16.5 % — ABNORMAL HIGH (ref 11.6–14.5)
WBC: 4.6 10*3/uL (ref 4.6–13.2)

## 2014-07-09 LAB — METABOLIC PANEL, COMPREHENSIVE
A-G Ratio: 0.9 (ref 0.8–1.7)
ALT (SGPT): 27 U/L (ref 13–56)
AST (SGOT): 41 U/L — ABNORMAL HIGH (ref 15–37)
Albumin: 3.8 g/dL (ref 3.4–5.0)
Alk. phosphatase: 93 U/L (ref 45–117)
Anion gap: 5 mmol/L (ref 3.0–18)
BUN/Creatinine ratio: 17 (ref 12–20)
BUN: 7 MG/DL (ref 7.0–18)
Bilirubin, total: 0.7 MG/DL (ref 0.2–1.0)
CO2: 29 mmol/L (ref 21–32)
Calcium: 9.2 MG/DL (ref 8.5–10.1)
Chloride: 106 mmol/L (ref 100–108)
Creatinine: 0.42 MG/DL — ABNORMAL LOW (ref 0.6–1.3)
GFR est AA: >60 mL/min/{1.73_m2} (ref >60)
GFR est non-AA: >60 mL/min/{1.73_m2} (ref >60)
Globulin: 4.1 g/dL — ABNORMAL HIGH (ref 2.0–4.0)
Glucose: 85 mg/dL (ref 74–99)
Potassium: 4.3 mmol/L (ref 3.5–5.5)
Protein, total: 7.9 g/dL (ref 6.4–8.2)
Sodium: 140 mmol/L (ref 136–145)

## 2014-07-09 LAB — MAGNESIUM: Magnesium: 2.1 mg/dL (ref 1.8–2.4)

## 2014-07-09 MED ORDER — PHENOBARB-HYOSCYAMN-ATROPINE-SCOP 16.2 MG-0.1037 MG/5 ML (5 ML) ELIXIR
16.2 mg-0.1037 mg/5 mL (5 mL) | Freq: Once | ORAL | Status: AC
Start: 2014-07-09 — End: 2014-07-09
  Administered 2014-07-09: 18:00:00 via ORAL

## 2014-07-09 MED ORDER — PANTOPRAZOLE 40 MG TAB, DELAYED RELEASE
40 mg | ORAL_TABLET | Freq: Every day | ORAL | Status: DC
Start: 2014-07-09 — End: 2014-07-17

## 2014-07-09 MED ORDER — LIDOCAINE 2 % MUCOSAL SOLN
2 % | Status: AC
Start: 2014-07-09 — End: 2014-07-09
  Administered 2014-07-09: 18:00:00 via OROMUCOSAL

## 2014-07-09 MED ORDER — VALSARTAN-HYDROCHLOROTHIAZIDE 160 MG-25 MG TAB
160-25 mg | ORAL_TABLET | Freq: Every day | ORAL | Status: DC
Start: 2014-07-09 — End: 2014-11-09

## 2014-07-09 MED ORDER — VALSARTAN 160 MG TAB
160 mg | ORAL_TABLET | ORAL | Status: DC
Start: 2014-07-09 — End: 2014-07-17

## 2014-07-09 MED ORDER — RANITIDINE 150 MG TAB
150 mg | ORAL_TABLET | Freq: Two times a day (BID) | ORAL | Status: AC
Start: 2014-07-09 — End: 2014-07-19

## 2014-07-09 MED ORDER — OMEPRAZOLE 40 MG CAP, DELAYED RELEASE
40 mg | ORAL_CAPSULE | ORAL | Status: DC
Start: 2014-07-09 — End: 2015-02-22

## 2014-07-09 MED FILL — PHENOBARB-HYOSCYAMN-ATROPINE-SCOP 16.2 MG-0.1037 MG/5 ML (5 ML) ELIXIR: 16.2 mg-0.1037 mg/5 mL (5 mL) | ORAL | Qty: 10

## 2014-07-09 MED FILL — LIDOCAINE VISCOUS 2 % MUCOSAL SOLUTION: 2 % | Qty: 15

## 2014-07-09 NOTE — Patient Instructions (Addendum)
Hypothyroidism: After Your Visit  Your Care Instructions  You have hypothyroidism, which means that your body is not making enough thyroid hormone. This hormone helps your body use energy. If your thyroid level is low, you may feel tired, be constipated, have an increase in your blood pressure, or have dry skin or memory problems. You may also get cold easily, even when it is warm. Women with low thyroid levels may have heavy menstrual periods.  A blood test to find your thyroid-stimulating hormone (TSH) level is used to check for hypothyroidism. A high TSH level may mean that you have low thyroid. When your body is not making enough thyroid hormone, TSH levels rise in an effort to make the body produce more.  The treatment for hypothyroidism is to take thyroid hormone pills. You should start to feel better in 1 to 2 weeks. But it can take several months to see changes in the TSH level. You will need regular visits with your doctor to make sure you have the right dose of medicine.  Most people need treatment for the rest of their lives. You will need to see your doctor regularly to have blood tests and to make sure you are doing well.  Follow-up care is a key part of your treatment and safety. Be sure to make and go to all appointments, and call your doctor if you are having problems. It???s also a good idea to know your test results and keep a list of the medicines you take.  How can you care for yourself at home?  ?? Take your thyroid hormone medicine exactly as prescribed. Call your doctor if you think you are having a problem with your medicine. Most people do not have side effects if they take the right amount of medicine regularly.  ?? Take the medicine 30 minutes before breakfast, and do not take it with calcium, vitamins, or iron.  ?? Do not take extra doses of your thyroid medicine. It will not help you get better any faster, and it may cause side effects.   ?? If you forget to take a dose, do NOT take a double dose of medicine. Take your usual dose the next day.  ?? Tell your doctor about all prescription, herbal, or over-the-counter products you take.  ?? Take care of yourself. Eat a healthy diet, get enough sleep, and get regular exercise.  When should you call for help?  Call 911 anytime you think you may need emergency care. For example, call if:  ?? You passed out (lost consciousness).  ?? You have severe trouble breathing.  ?? You have a very slow heartbeat (less than 60 beats a minute).  ?? You have a low body temperature (95??F or below).  Call your doctor now or seek immediate medical care if:  ?? You feel tired, sluggish, or weak.  ?? You have trouble remembering things or concentrating.  ?? You do not begin to feel better 2 weeks after starting your medicine.  Watch closely for changes in your health, and be sure to contact your doctor if you have any problems.   Where can you learn more?   Go to http://www.healthwise.net/[facility]  Enter N862 in the search box to learn more about "Hypothyroidism: After Your Visit."   ?? 2006-2015 Healthwise, Incorporated. Care instructions adapted under license by [place] (which disclaims liability or warranty for this information). This care instruction is for use with your licensed healthcare professional. If you have questions about a medical   condition or this instruction, always ask your healthcare professional. [place] any warranty or liability for your use of this information.  Content Version: 10.5.422740; Current as of: October 20, 2013              DASH Diet: After Your Visit  Your Care Instructions  The DASH diet is an eating plan that can help lower your blood pressure. DASH stands for Dietary Approaches to Stop Hypertension. Hypertension is high blood pressure.  The DASH diet focuses on eating foods that are high in calcium, potassium,  and magnesium. These nutrients can lower blood pressure. The foods that are highest in these nutrients are fruits, vegetables, low-fat dairy products, nuts, seeds, and legumes. But taking calcium, potassium, and magnesium supplements instead of eating foods that are high in those nutrients does not have the same effect. The DASH diet also includes whole grains, fish, and poultry.  The DASH diet is one of several lifestyle changes your doctor may recommend to lower your high blood pressure. Your doctor may also want you to decrease the amount of sodium in your diet. Lowering sodium while following the DASH diet can lower blood pressure even further than just the DASH diet alone.  Follow-up care is a key part of your treatment and safety. Be sure to make and go to all appointments, and call your doctor if you are having problems. It's also a good idea to know your test results and keep a list of the medicines you take.  How can you care for yourself at home?  Following the DASH diet  ?? Eat 4 to 5 servings of fruit each day. A serving is 1 medium-sized piece of fruit, ?? cup chopped or canned fruit, 1/4 cup dried fruit, or 4 ounces (?? cup) of fruit juice. Choose fruit more often than fruit juice.  ?? Eat 4 to 5 servings of vegetables each day. A serving is 1 cup of lettuce or raw leafy vegetables, ?? cup of chopped or cooked vegetables, or 4 ounces (?? cup) of vegetable juice. Choose vegetables more often than vegetable juice.  ?? Get 2 to 3 servings of low-fat and fat-free dairy each day. A serving is 8 ounces of milk, 1 cup of yogurt, or 1 ?? ounces of cheese.  ?? Eat 6 to 8 servings of grains each day. A serving is 1 slice of bread, 1 ounce of dry cereal, or ?? cup of cooked rice, pasta, or cooked cereal. Try to choose whole-grain products as much as possible.  ?? Limit lean meat, poultry, and fish to 2 servings each day. A serving is 3 ounces, about the size of a deck of cards.   ?? Eat 4 to 5 servings of nuts, seeds, and legumes (cooked dried beans, lentils, and split peas) each week. A serving is 1/3 cup of nuts, 2 tablespoons of seeds, or ?? cup of cooked beans or peas.  ?? Limit fats and oils to 2 to 3 servings each day. A serving is 1 teaspoon of vegetable oil or 2 tablespoons of salad dressing.  ?? Limit sweets and added sugars to 5 servings or less a week. A serving is 1 tablespoon jelly or jam, ?? cup sorbet, or 1 cup of lemonade.  ?? Eat less than 2,300 milligrams (mg) of sodium a day. If you have high blood pressure, diabetes, or chronic kidney disease, if you are African-American, or if you are older than age 58, try to limit the amount of sodium  you eat to less than 1,500 mg a day.  Tips for success  ?? Start small. Do not try to make dramatic changes to your diet all at once. You might feel that you are missing out on your favorite foods and then be more likely to not follow the plan. Make small changes, and stick with them. Once those changes become habit, add a few more changes.  ?? Try some of the following:  ?? Make it a goal to eat a fruit or vegetable at every meal and at snacks. This will make it easy to get the recommended amount of fruits and vegetables each day.  ?? Try yogurt topped with fruit and nuts for a snack or healthy dessert.  ?? Add lettuce, tomato, cucumber, and onion to sandwiches.  ?? Combine a ready-made pizza crust with low-fat mozzarella cheese and lots of vegetable toppings. Try using tomatoes, squash, spinach, broccoli, carrots, cauliflower, and onions.  ?? Have a variety of cut-up vegetables with a low-fat dip as an appetizer instead of chips and dip.  ?? Sprinkle sunflower seeds or chopped almonds over salads. Or try adding chopped walnuts or almonds to cooked vegetables.  ?? Try some vegetarian meals using beans and peas. Add garbanzo or kidney beans to salads. Make burritos and tacos with mashed pinto beans or black beans.   Where can you learn more?    Go to [employer]  Enter H967 in the search box to learn more about "DASH Diet: After Your Visit."   ?? 2006-2015 Healthwise, Incorporated. Care instructions adapted under license by [employer] (which disclaims liability or warranty for this information). This care instruction is for use with your licensed healthcare professional. If you have questions about a medical condition or this instruction, always ask your healthcare professional. [place] any warranty or liability for your use of this information.  Content Version: 10.5.422740; Current as of: January 26, 2014              Starting a Weight Loss Plan: After Your Visit  Your Care Instructions  If you are thinking about losing weight, it can be hard to know where to start. Your doctor can help you set up a weight loss plan that best meets your needs. You may want to take a class on nutrition or exercise, or join a weight loss support group. If you have questions about how to make changes to your eating or exercise habits, ask your doctor about seeing a registered dietitian or an exercise specialist.  It can be a big challenge to lose weight. But you do not have to make huge changes at once. Make small changes, and stick with them. When those changes become habit, add a few more changes.  If you do not think you are ready to make changes right now, try to pick a date in the future. Make an appointment to see your doctor to discuss whether the time is right for you to start a plan.  Follow-up care is a key part of your treatment and safety. Be sure to make and go to all appointments, and call your doctor if you are having problems. It???s also a good idea to know your test results and keep a list of the medicines you take.  How can you care for yourself at home?  ?? Set realistic goals. Many people expect to lose much more weight than is likely. A weight loss of 5% to 10% of your body weight may be  enough to  improve your health.  ?? Get family and friends involved to provide support. Talk to them about why you are trying to lose weight, and ask them to help. They can help by participating in exercise and having meals with you, even if they may be eating something different.  ?? Find what works best for you. If you do not have time or do not like to cook, a program that offers meal replacement bars or shakes may be better for you. Or if you like to prepare meals, finding a plan that includes daily menus and recipes may be best.  ?? Ask your doctor about other health professionals who can help you achieve your weight loss goals.  ?? A dietitian can help you make healthy changes in your diet.  ?? An exercise specialist or personal trainer can help you develop a safe and effective exercise program.  ?? A counselor or psychiatrist can help you cope with issues such as depression, anxiety, or family problems that can make it hard to focus on weight loss.  ?? Consider joining a support group for people who are trying to lose weight. Your doctor can suggest groups in your area.   Where can you learn more?   Go to [employer].com.cy  Enter U357 in the search box to learn more about "Starting a Weight Loss Plan: After Your Visit."   ?? 2006-2015 Healthwise, Incorporated. Care instructions adapted under license by [employer] (which disclaims liability or warranty for this information). This care instruction is for use with your licensed healthcare professional. If you have questions about a medical condition or this instruction, always ask your healthcare professional. [place] any warranty or liability for your use of this information.  Content Version: 10.5.422740; Current as of: January 26, 2014              Chest Pain: After Your Visit  Your Care Instructions  There are many things that can cause chest pain. Some are not serious and  will get better on their own in a few days. But some kinds of chest pain need more testing and treatment. Your doctor may have recommended a follow-up visit in the next 8 to 12 hours. If you are not getting better, you may need more tests or treatment.  Even though your doctor has released you, you still need to watch for any problems. The doctor carefully checked you, but sometimes problems can develop later. If you have new symptoms or if your symptoms do not get better, get medical care right away.  If you have worse or different chest pain or pressure that lasts more than 5 minutes or you passed out (lost consciousness), call 911 or seek other emergency help right away.   A medical visit is only one step in your treatment. Even if you feel better, you still need to do what your doctor recommends, such as going to all suggested follow-up appointments and taking medicines exactly as directed. This will help you recover and help prevent future problems.  How can you care for yourself at home?  ?? Rest until you feel better.  ?? Take your medicine exactly as prescribed. Call your doctor if you think you are having a problem with your medicine.  ?? Do not drive after taking a prescription pain medicine.  When should you call for help?  Call 911 if:   ?? You passed out (lost consciousness).  ?? You have severe difficulty breathing.  ??  You have symptoms of a heart attack. These may include:  ?? Chest pain or pressure, or a strange feeling in your chest.  ?? Sweating.  ?? Shortness of breath.  ?? Nausea or vomiting.  ?? Pain, pressure, or a strange feeling in your back, neck, jaw, or upper belly or in one or both shoulders or arms.  ?? Lightheadedness or sudden weakness.  ?? A fast or irregular heartbeat.  After you call 911, the operator may tell you to chew 1 adult-strength or 2 to 4 low-dose aspirin. Wait for an ambulance. Do not try to drive yourself.  Call your doctor today if:   ?? You have any trouble breathing.   ?? Your chest pain gets worse.  ?? You are dizzy or lightheaded, or you feel like you may faint.  ?? You are not getting better as expected.  ?? You are having new or different chest pain.   Where can you learn more?   Go to [employer].com.cy  Enter A120 in the search box to learn more about "Chest Pain: After Your Visit."   ?? 2006-2015 Healthwise, Incorporated. Care instructions adapted under license by [employer] (which disclaims liability or warranty for this information). This care instruction is for use with your licensed healthcare professional. If you have questions about a medical condition or this instruction, always ask your healthcare professional. [place] any warranty or liability for your use of this information.  Content Version: 10.5.422740; Current as of: May 10, 2013

## 2014-07-09 NOTE — Telephone Encounter (Signed)
Diovan and Omeprazole refilled and sent to pharmacy. Please notify pt.

## 2014-07-09 NOTE — ED Notes (Signed)
I have reviewed discharge instructions with the patient.  The patient verbalized understanding.

## 2014-07-09 NOTE — ED Provider Notes (Signed)
HPI Comments: Elaine [name] is a 63 y.o. female with a history of HTN, asthma, and anemia, who presents to the ED c/o midline chest/epigastric pain which began one month ago. Patient reports that her chest pain has been constant for about one month and was seen at [facility] "a couple weeks ago". She states that her EKG was normal, but her pain has not resolved. She describes her pain as tightness, "like an elephant sitting on my chest.  Its not severe but I'm always aware that its there" Her pain is exacerbated at night when lying down, no alleviating factors noted.  She denies any cardiac history.  No dyspnea, no PND, no orthopnea.  She denies any neck or back pain.  Patient denies diaphoresis, nausea, vomiting, new cough, congestion, or recent illness. She notes that Dr. Shea Evans previously diagnosed her with GERD and started her on omeprazole which she has been taking.  She states she has never had a stress test or endoscopy. Patient does note increased burping at night. She went to see her new PCP Dr. Doreatha Martin today and he referred pt to ED due to symptoms. No other symptoms or complaints were presented at this time.      The history is provided by the patient.        Past Medical History   Diagnosis Date   ??? Iron deficiency anemia 05/04/4131   ??? Lichen planus 4/40/1027   ??? Hypertension    ??? Hypercholesterolemia    ??? Thyroid disease      hypothyroidism   ??? Asthma    ??? Right sided sciatica    ??? Colon polyp    ??? Depression 12/18/2011   ??? OA (osteoarthritis of spine) 02/15/2009   ??? DDD (degenerative disc disease) 02/15/2009   ??? OA (osteoarthritis of spine) 02/15/2009   ??? Sleep apnea 02/15/2009     uses cpap machine   ??? History of echocardiogram 03/20/2004     EF 70%.  Borderline DDfx.  No significant valvular pathology.   ??? Thallium stress test  03/20/2004     Partially transient, mod basal & mid anterior defect most c/w artifact; mild anterior ischemia less likely.  Neg EKG on pharm stress test.    ??? Lower extremity venous duplex 10/23/2009     Left leg:  No DVT.     ??? Anemia    ??? Shortness of breath      Possible asthma, HCVD; less likely CAD (Noted 03/15/09)   ??? Essential hypertension, benign    ??? Other and unspecified hyperlipidemia    ??? Pre-operative cardiovascular examination      For spine surgery   ??? Obesity, unspecified    ??? Meralgia paraesthetica 10/22/2013        Past Surgical History   Procedure Laterality Date   ??? Pr neurological procedure unlisted  03-16-2009     s/p ACD & Fusion (Dr.P.Gurtner)   ??? Pr colonoscopy,diagnostic  02-2007     +polyp(tubular adenoma); Dr Angelique Holm   ??? Hx tubal ligation     ??? Hx hysterectomy     ??? Hx hernia repair       3x   ??? Hx colonoscopy  02-24-12     normal (Dr. Angelique Holm)         Family History   Problem Relation Age of Onset   ??? Cancer Mother    ??? Hypertension Mother    ??? Hypertension Sister      x3   ???  Heart Surgery Sister    ??? Heart Disease Sister    ??? Heart Attack Sister    ??? Cancer Maternal Aunt      breast   ??? Breast Cancer Maternal Aunt    ??? Glaucoma Son    ??? Heart defect Son    ??? Diabetes Paternal Uncle    ??? Hypertension Paternal Uncle         History     Social History   ??? Marital Status: MARRIED     Spouse Name: N/A     Number of Children: N/A   ??? Years of Education: N/A     Occupational History   ??? Not on file.     Social History Main Topics   ??? Smoking status: Former Smoker -- 28 years     Quit date: 04/25/2004   ??? Smokeless tobacco: Never Used   ??? Alcohol Use: No   ??? Drug Use: No   ??? Sexual Activity: Not on file     Other Topics Concern   ??? Not on file     Social History Narrative                  ALLERGIES: Asa-acetaminophen-caff-potass; Benadryl; Other plant, animal, environmental; and Pollen extracts      Review of Systems   Constitutional: Negative.  Negative for diaphoresis.   HENT: Negative.  Negative for congestion.    Eyes: Negative.    Respiratory: Negative.  Negative for cough (see HPI) and shortness of breath.     Cardiovascular: Positive for chest pain.   Gastrointestinal: Negative for nausea, vomiting and abdominal pain.   Endocrine: Negative.    Genitourinary: Negative.    Musculoskeletal: Negative.    Skin: Negative.    Allergic/Immunologic: Negative.    Neurological: Negative.    Hematological: Negative.    Psychiatric/Behavioral: Negative.    All other systems reviewed and are negative.      Filed Vitals:    07/09/14 1200 07/09/14 1215 07/09/14 1230 07/09/14 1245   BP: 167/81 169/70 164/74 161/69   Pulse: 77 74 71 69   Temp:       Resp: 17 22 21 20    SpO2: 99% 99% 100% 97%            Physical Exam   Constitutional: She is oriented to person, place, and time. She appears well-developed and well-nourished. No distress.   Resting comfortably on stretcher   HENT:   Head: Normocephalic and atraumatic.   MM moist   Eyes: Conjunctivae and EOM are normal. No scleral icterus.   Sclera clear bilaterally   Neck: Normal range of motion. Neck supple. No JVD present.   Non-tender to palpation   Cardiovascular: Normal rate, regular rhythm and normal heart sounds.  Exam reveals no gallop and no friction rub.    No murmur heard.  Pulmonary/Chest: Effort normal and breath sounds normal. No respiratory distress. She has no wheezes. She has no rales. She exhibits no tenderness.   No crepitance with palpation   Abdominal: Soft. Bowel sounds are normal. She exhibits no distension. There is no tenderness. There is no rebound and no guarding.   Genitourinary:   No CVA tenderness   Musculoskeletal: She exhibits no edema or tenderness.   Normal inspection of upper extremities.  No edema noted to bilateral lower extremities   Lymphadenopathy:     She has no cervical adenopathy.   Neurological: She is alert and oriented to person, place, and  time. No cranial nerve deficit. She exhibits normal muscle tone.   Normal motor and sensation bilaterally to upper and lower extremities   Skin: Skin is warm and dry. No rash noted. She is not diaphoretic.    Psychiatric:   Normal mood and affect.     Vitals reviewed.       MDM  Number of Diagnoses or Management Options  Diagnosis management comments: Pt with history of 1 month of constant epigastric/central chest which is described as "pressure" and exacerbated by supine position and associated with belching.  She denies any cardiac history, does have a history of GERD.  No acute distress noted, pt sleeping on stretcher.  Will check labs, XR, EKG and reassess but suspect only single set of enzymes will be needed to evaluate for cardiac ischemia.  GI cocktail in ED and assess for improvement in symptoms.    Pt sleeping comfortably.  Cardiac evaluation negative. Will discharge to home with GI and cardiology referral, PPI.  Pt in agreement with discharge plans.       Amount and/or Complexity of Data Reviewed  Clinical lab tests: ordered and reviewed  Tests in the radiology section of CPT??: ordered and reviewed  Tests in the medicine section of CPT??: reviewed and ordered  Decide to obtain previous medical records or to obtain history from someone other than the patient: yes  Obtain history from someone other than the patient: yes  Independent visualization of images, tracings, or specimens: yes    Risk of Complications, Morbidity, and/or Mortality  Presenting problems: moderate  Management options: moderate    Patient Progress  Patient progress: improved      Procedures    EKG:     CXR: No acute pulmonary process.     Scribe Attestation Statement:   Myna Bright 07/09/2014 1:10 PM scribing for and in the presence of Dr. Ina Kick, MD     [facility], Scribe      I personally performed the services described in the documentation, reviewed the documentation, as recorded by the scribe in my presence, and it accurately and completely records my words and actions.  Ina Kick, MD

## 2014-07-09 NOTE — Progress Notes (Signed)
Patient in office today for 6 month follow up. Patient is not taking the Glucosamine and not using Symbicort. Patient also complaints of chest tightness mostly at night, patient also states that she also burps when she experiences the chest tightness.    Do you have an Advance Directive? No  Would you like more information about Advance Directive? Yes  Patient given MyChart activation instructions       1. Have you been to the ER, urgent care clinic since your last visit?  Hospitalized since your last visit? Yes, Patient was seen at [facility] for chest tightness and pain 3 weeks ago and was prescribed carafate.     2. Have you seen or consulted any other health care providers outside of the [place] since your last visit?  Include any pap smears or colon screening. No

## 2014-07-09 NOTE — Telephone Encounter (Signed)
Last OV 07/09/2014

## 2014-07-09 NOTE — ED Notes (Signed)
"   I am having chest pains and I feel like I have something in my throat"

## 2014-07-09 NOTE — Telephone Encounter (Signed)
[facility] office to inform patient that medication refills was sent to the pharmacy.

## 2014-07-09 NOTE — ED Notes (Cosign Needed)
I performed a brief evaluation, including history and physical, of the patient here in triage and I have determined that pt will need further treatment and evaluation from the main side ER physician.  I have placed initial orders to help in expediting patients care. Intermittent substernal dull CP associated with SOB radiates to her throat and under arm areas. It occurs mainly at rest. Also endorses acid reflux sx. Intermittent x 3 weeks.     Sent by her [facility] Dr. Doreatha Martin for evaluation.      July 09, 2014 at 10:29 AM - Tasia Catchings, PA

## 2014-07-09 NOTE — Progress Notes (Signed)
Chief Complaint   Patient presents with   ??? Hypertension     6 month follow up       HPI:  Patient is a 63 year old African American female with medical problems listed below presents today for follow up of Hypertension, Hyperlipidemia, GERD, Prediabetes, etc. She endorse chest tightness that started three weeks ago occuring mostly at night and associated is intermittent dull chest pain localized mostly to the center of chest and sometimes radiates to underneath both arms. Chest pain is constant and associated is intermittent SOB, increased burping, pain with swallowing, and she feels something is stuck in her throat. She denies nausea, vomiting, diaphoresis, and went to [facility] at onset of the chest pain with EKG done that was fine per patient, and was given Sucralfate with no relief of the chest pain. BP is elevated at the office today initially at 160/94 by nurse with repeat by me at 192/100.       Past Medical History   Diagnosis Date   ??? Iron deficiency anemia 6/57/8469   ??? Lichen planus 06/05/5283   ??? Hypertension    ??? Hypercholesterolemia    ??? Thyroid disease      hypothyroidism   ??? Asthma    ??? Right sided sciatica    ??? Colon polyp    ??? Depression 12/18/2011   ??? OA (osteoarthritis of spine) 02/15/2009   ??? DDD (degenerative disc disease) 02/15/2009   ??? OA (osteoarthritis of spine) 02/15/2009   ??? Sleep apnea 02/15/2009     uses cpap machine   ??? History of echocardiogram 03/20/2004     EF 70%.  Borderline DDfx.  No significant valvular pathology.   ??? Thallium stress test  03/20/2004     Partially transient, mod basal & mid anterior defect most c/w artifact; mild anterior ischemia less likely.  Neg EKG on pharm stress test.   ??? Lower extremity venous duplex 10/23/2009     Left leg:  No DVT.     ??? Anemia    ??? Shortness of breath      Possible asthma, HCVD; less likely CAD (Noted 03/15/09)   ??? Essential hypertension, benign    ??? Other and unspecified hyperlipidemia    ??? Pre-operative cardiovascular examination       For spine surgery   ??? Obesity, unspecified    ??? Meralgia paraesthetica 10/22/2013     Allergies   Allergen Reactions   ??? Asa-Acetaminophen-Caff-Potass Other (comments)     Bleeding in stomach   ??? Benadryl [Diphenhydramine Hcl] Other (comments)     Muscle jerking   ??? Other Plant, Animal, Environmental Not Reported This Time     Grass, Dust and Mold   ??? Pollen Extracts Not Reported This Time     Current Outpatient Prescriptions   Medication Sig Dispense Refill   ??? sucralfate (CARAFATE) 1 gram tablet Take 1 g by mouth four (4) times daily.     ??? valsartan-hydrochlorothiazide (DIOVAN-HCT) 160-25 mg per tablet Take 1 Tab by mouth daily. 30 Tab 3   ??? levothyroxine (SYNTHROID) 150 mcg tablet TAKE 1 TABLET BY MOUTH DAILY BEFORE BREAKFAST 30 Tab 0   ??? montelukast (SINGULAIR) 10 mg tablet Take 1 Tab by mouth nightly as needed. Indications: asthma 90 Tab 3   ??? gabapentin (NEURONTIN) 300 mg capsule Take 2 capsules at bedtime and 1 capsule in the morning. 90 Cap 3   ??? albuterol (PROVENTIL HFA) 90 mcg/actuation inhaler Take 2 Puffs by inhalation  every six (6) hours as needed for Wheezing. 1 Inhaler 5   ??? IRON DEXTRAN COMPLEX (IRON DEXTRAN IV) by IntraVENous route.     ??? CYANOCOBALAMIN, VITAMIN B-12, (VITAMIN B-12 PO) Take 1 Tab by mouth daily.     ??? Cholecalciferol, Vitamin D3, (VITAMIN D-3) 5,000 unit Tab Take  by mouth daily.     ??? thiamine (VITAMIN B-1) 100 mg tablet Take  by mouth daily.     ??? CRESTOR 40 mg tablet take 1 tablet by mouth once daily 30 Tab 0   ??? valsartan (DIOVAN) 160 mg tablet take 1 tablet by mouth once daily 30 Tab 1   ??? omeprazole (PRILOSEC) 40 mg capsule take 1 capsule by mouth once daily BEFORE BREAKFAST 30 Cap 5   ??? pantoprazole (PROTONIX) 40 mg tablet Take 1 Tab by mouth daily. 30 Tab 0   ??? ranitidine (ZANTAC) 150 mg tablet Take 1 Tab by mouth two (2) times a day for 10 days. 20 Tab 0   ??? budesonide-formoterol (SYMBICORT) 160-4.5 mcg/actuation HFA inhaler Take  2 Puffs by inhalation two (2) times a day. 1 Inhaler 5   ??? GLUCOSAMINE SULFATE (GLUCOSAMINE PO) Take 1 Tab by mouth daily.            ROS:  Constitutional: Negative for fever, chills, or fatigue  Neurological: Negative for headache, dizziness, visual disturbance, or loss of conciousness  Respiratory: Negative for SOB, hemoptysis, or wheezing  Cardiovascular: Positive for chest pain. Negative for palpitation or leg swelling  Gastrointestinal: Negative for abdominal pain, nausea, vomiting, diarrhea, blood in stool, melena  Musculoskeletal: Negative for falls      Physical Exam:  BP 192/100 mmHg   Pulse 85   Temp(Src) 98.1 ??F (36.7 ??C) (Oral)   Resp 18   Ht 5\' 2"  (1.575 m)   Wt 194 lb (87.998 kg)   BMI 35.47 kg/m2   SpO2 97%  General: Obese black female, a & o x 3, afebrile, interacting appropriately, in no acute distress  Skin: warm and dry, no rashes , no bruises  Neck: supple, symmetrical, no thyromegaly  Respiratory: symmetrical chest expansion, lung sounds clear bilaterally, good air entry, good respiratory effort, no wheezes or crackles  Cardiovascular: normal S1S2, regular rate and rhythm, no murmurs, pulses palpable, no thrill, no carotid or abdominal bruits, no peripheral edema, no JVD  Abdomen: non-distended, normoactive bowel sounds x 4 quadrants, soft, non-tender to palpation  Musculoskeletal: normal ROM on all joints, no swelling or deformity, no perilumbar tenderness, steady gait      Assessment/Plan:    ICD-9-CM    1. Chest tightness 786.59 AMB POC EKG ROUTINE W/ 12 LEADS, INTER & REP  EKG done at the office today reviewed by me and was NSR with no ischemia noted   2. HTN (hypertension), benign 401.1 valsartan-hydrochlorothiazide (DIOVAN-HCT) 160-25 mg per tablet daily started today and Diovan discontinued and was counseled on low salt diet.  Advised on home BP monitoring and to keep diary and present at f/u in 1 week   3. Hypercholesterolemia 272.0 Continue Crestor and was counseled on low fat diet    4. Gastroesophageal reflux disease without esophagitis 530.81 Stable   5. Obesity (BMI 30-39.9) 278.00 Discussed the patient's above normal BMI with her.  I have recommended the following interventions: dietary management education, guidance, and counseling  encourage exercise  lifestyle education regarding diet .  The BMI follow up plan is as follows: BMI is out of normal parameters and plan is  as follows: I have counseled this patient on diet and exercise regimens   6. Pre-diabetes 790.29 Most recent HBA1C was 6.4  Counseled on diet and exercise       Orders Placed This Encounter   ??? AMB POC EKG ROUTINE W/ 12 LEADS, INTER & REP     Order Specific Question:  Reason for Exam:     Answer:  chest tightness   ??? sucralfate (CARAFATE) 1 gram tablet     Sig: Take 1 g by mouth four (4) times daily.   ??? valsartan-hydrochlorothiazide (DIOVAN-HCT) 160-25 mg per tablet     Sig: Take 1 Tab by mouth daily.     Dispense:  30 Tab     Refill:  3       Recent labs reviewed with pt      Additional Notes: Discussed today's diagnosis, treatment plans. Discussed medication indications and side effects.    Weight Management:Counseled  After Visit Summary: Discussed provided printed patient instructions. Answered questions accordingly.  Follow-up Disposition: In 1 week for Hypertension          Osie Cheeks, DO, MPH  Internal Medicine        Patient was advised to go to [facility] ED immediately for further evaluation of chest pain and I called and spoke with ED physician Dr. Alison Stalling and they are expecting pt          Total time spent on today's visit was 45 minutes with greater than 50% of the time involved in counseling on chest pain, appropriate diet, weight loss measures, and discussing with ED physician.

## 2014-07-09 NOTE — ED Notes (Signed)
Assumed care of Pt. Pt to ED bed 09 from waiting room.

## 2014-07-10 ENCOUNTER — Encounter

## 2014-07-10 LAB — EKG, 12 LEAD, INITIAL
Atrial Rate: 85 {beats}/min
Calculated P Axis: 75 degrees
Calculated R Axis: 27 degrees
Calculated T Axis: 39 degrees
Diagnosis: NORMAL
P-R Interval: 190 ms
Q-T Interval: 388 ms
QRS Duration: 74 ms
QTC Calculation (Bezet): 461 ms
Ventricular Rate: 85 {beats}/min

## 2014-07-10 LAB — NUCLEAR STRESS TEST
ECG Interp. Before Exercise: NORMAL
Functional capacity: NORMAL
Max. Diastolic BP: 88 mmHg
Max. Heart rate: 136 {beats}/min
Max. Systolic BP: 150 mmHg
Overall BP response to exercise: NORMAL
Peak Ex METs: 7 METS

## 2014-07-10 MED ORDER — CRESTOR 40 MG TABLET
40 mg | ORAL_TABLET | ORAL | Status: DC
Start: 2014-07-10 — End: 2014-08-18

## 2014-07-10 MED ADMIN — 0.9% sodium chloride infusion: INTRAVENOUS | @ 16:00:00 | NDC 00409798309

## 2014-07-10 MED FILL — SODIUM CHLORIDE 0.9 % IV: INTRAVENOUS | Qty: 1000

## 2014-07-10 NOTE — Procedures (Signed)
[facility]                               [address] [place], [place] [address]                          NUCLEAR MEDICINE CARDIAC STRESS    PATIENT:    Elaine [name], Elaine [name]  MRN:        [id number]  CSN::       [id number]  DATE:       07/10/2014  LOCATION:  REFERRING:  Osie Cheeks, DO  DICTATING:  Carlyle Dolly, MD      EKG PART OF EXERCISE MYOVIEW GATED SCAN    DIAGNOSIS: Chest pain.    BASELINE DATA: Heart rate is 85, blood pressure 117/72. EKG shows sinus  rhythm, and no acute ST-T changes.    EXERCISE DATA: The patient exercised on standard Bruce protocol. She exercised for  a total of 5 minutes 5 seconds, achieving 7.0 METS. Activity was stopped  due to fatigue. Maximum heart rate noted is 134, which relates to 86% of  predicted maximum. No chest pain, arrhythmia, or significant shortness of  breath noted. EKG has no ST-T changes.    CONCLUSIONS  1. Negative EKG changes at 86% of predicted maximum heart rate.  2. Low normal functional capacity.  3. Appropriate heart rate and blood pressure response, with maximum BP of  150/88.  4. No ischemic symptoms or arrhythmias seen.  5. Perfusion image report to follow.    PERFUSION REPORT/PROCEDURE NOTE    10 mCi of Myoview given for resting images, 32 mCi for stress images, at  peak heart rate during exercise. Images were obtained in multiple planes,  reconstructed and gated in the usual fashion. Following are the findings:    1. Stress images revealed mildly reduced Myoview uptake in the mid and  basal segments of the anterior wall, seen in short axis and vertical long  axis views.  2. Resting images seem to have redistribution in the anterior wall.  3. Raw data revealed significant GI uptake, which overlies the inferior  wall of the left ventricle, which may artifactually reduce the uptake in  the anterior wall.  4. Gated images reveal  normal wall motion, and ejection fraction is  calculated at 58%.    CONCLUSION  1. Mildly abnormal scan.  2. Evidence of mild anterior ischemia, may be artifactual in this patient,  secondary to hot spot artifact in the inferior wall, due to overlapping of  the GI uptake.  3. Normal wall motion and preserved ejection fraction.  4. This appears to be a low risk scan, but clinical correlation is  recommended, and if ischemia is clinically suspected, further workup may be  warranted.  5. Compared to the previous stress test that was done on 08/25/2012, these  changes were not appreciated at that time.                       Carlyle Dolly, MD  BKG:wmx  D: 07/10/2014  CScript:07/10/2014 08:01 P  CQDocID #: [id number]   CScriptDoc #:  [id number]   cc:   Ina Kick, MD         Carlyle Dolly, MD         Osie Cheeks, DO

## 2014-07-10 NOTE — Telephone Encounter (Signed)
Patient called and made aware that medication refill was sent to the pharmacy.

## 2014-07-10 NOTE — Progress Notes (Signed)
Patient was given 10.0 milliCuries of 69mTc-Myoview for the resting images.  Patient was also given 32.0 milliCuries of 38mTc-Myoview for the stress images.  Patient walked on treadmill during nuclear stress test.  Patient's armband was discarded and shredded

## 2014-07-10 NOTE — Procedures (Signed)
[facility]                               [address] [place], [place] [address]                          NUCLEAR MEDICINE CARDIAC STRESS    PATIENT:    Elaine [name], Elaine [name]  MRN:        [id number]  CSN::       [id number]  DATE:       07/10/2014  LOCATION:  REFERRING:  Osie Cheeks, DO  DICTATING:  Carlyle Dolly, MD      EKG PART OF EXERCISE MYOVIEW GATED SCAN    DIAGNOSIS: Chest pain.    BASELINE DATA: Heart rate is 85, blood pressure 117/72. EKG shows sinus  rhythm, and no acute ST-T changes.    EXERCISE DATA: The patient exercised on standard Bruce protocol. She exercised for  a total of 5 minutes 5 seconds, achieving 7.0 METS. Activity was stopped  due to fatigue. Maximum heart rate noted is 134, which relates to 86% of  predicted maximum. No chest pain, arrhythmia, or significant shortness of  breath noted. EKG has no ST-T changes.    CONCLUSIONS  1. Negative EKG changes at 86% of predicted maximum heart rate.  2. Low normal functional capacity.  3. Appropriate heart rate and blood pressure response, with maximum BP of  150/88.  4. No ischemic symptoms or arrhythmias seen.  5. Perfusion image report to follow.    PERFUSION REPORT/PROCEDURE NOTE    10 mCi of Myoview given for resting images, 32 mCi for stress images, at  peak heart rate during exercise. Images were obtained in multiple planes,  reconstructed and gated in the usual fashion. Following are the findings:    1. Stress images revealed mildly reduced Myoview uptake in the mid and  basal segments of the anterior wall, seen in short axis and vertical long  axis views.  2. Resting images seem to have redistribution in the anterior wall.  3. Raw data revealed significant GI uptake, which overlies the inferior  wall of the left ventricle, which may artifactually reduce the uptake in  the anterior wall.   4. Gated images reveal normal wall motion, and ejection fraction is  calculated at 58%.    CONCLUSION  1. Mildly abnormal scan.  2. Evidence of mild anterior ischemia, may be artifactual in this patient,  secondary to hot spot artifact in the inferior wall, due to overlapping of  the GI uptake.  3. Normal wall motion and preserved ejection fraction.  4. This appears to be a low risk scan, but clinical correlation is  recommended, and if ischemia is clinically suspected, further workup may be  warranted.  5. Compared to the previous stress test that was done on 08/25/2012, these  changes were not appreciated at that time.                       Carlyle Dolly, MD  BKG:wmx  D: 07/10/2014  CScript:07/10/2014 08:01 P  CQDocID #: [id number]   CScriptDoc #:  [id number]   cc:   Ina Kick, MD         Carlyle Dolly, MD         Osie Cheeks, DO

## 2014-07-10 NOTE — Telephone Encounter (Signed)
Crestor refilled and sent to pharmacy. Please notify pt.

## 2014-07-11 ENCOUNTER — Encounter

## 2014-07-11 NOTE — Telephone Encounter (Signed)
Cardiology consult placed for abnormal stress test. Please notify pt.

## 2014-07-11 NOTE — Telephone Encounter (Signed)
Please call Elaine [name] and advise her to forward the stress test results to the ED physician who ordered the test, as pt will require a cardiology consult and possible cardiac catheterization for the abnormal stress test.

## 2014-07-11 NOTE — Telephone Encounter (Signed)
Spoke with Elaine [name] and she stated that all results are to be forwarded to the patient's PCP. The ED physician will not do anything but refer the patient back to her PCP to do the necessary referral for Cardiology.

## 2014-07-11 NOTE — Telephone Encounter (Signed)
Maricel with Cardiology called stating that the patient had a stress test done yesterday ordered by  ER Dr. Tilman Neat and the results showed slightly abnormal. Will fax a copy of the report over for Dr. Doreatha Martin to review.

## 2014-07-12 NOTE — Telephone Encounter (Signed)
Patient is already scheduled with Dr. Clayborne Dana today.

## 2014-07-17 NOTE — Progress Notes (Signed)
Chief Complaint   Patient presents with   ??? Hypertension     6 month follow up       HPI:  Patient is a 63 year old African American female with medical problems listed below presents today for follow up of Hypertension after her blood pressure medication was adjusted at her last visit one week ago. She brought in diary of her home blood pressure readings showing better control and blood pressure is also controlled at the office today. She was sent to [facility] emergency department at her last visit because of chest pain and was evaluated and subsequently underwent a stress test that was mildly abnormal with mild anterior ischemia noted. She is followed by cardiologist Dr. Marchelle Folks who has recommended a stress echo versus possible cardiac catherterization based on findings on the stress echo. She still has minimal intermittent chest pain though better today, but denies SOB, palpitations, or other complaints.         Past Medical History   Diagnosis Date   ??? Iron deficiency anemia 0/09/2724   ??? Lichen planus 3/66/4403   ??? Hypertension    ??? Hypercholesterolemia    ??? Thyroid disease      hypothyroidism   ??? Asthma    ??? Right sided sciatica    ??? Colon polyp    ??? Depression 12/18/2011   ??? OA (osteoarthritis of spine) 02/15/2009   ??? DDD (degenerative disc disease) 02/15/2009   ??? OA (osteoarthritis of spine) 02/15/2009   ??? Sleep apnea 02/15/2009     uses cpap machine   ??? History of echocardiogram 03/20/2004     EF 70%.  Borderline DDfx.  No significant valvular pathology.   ??? Thallium stress test  03/20/2004     Partially transient, mod basal & mid anterior defect most c/w artifact; mild anterior ischemia less likely.  Neg EKG on pharm stress test.   ??? Lower extremity venous duplex 10/23/2009     Left leg:  No DVT.     ??? Anemia    ??? Shortness of breath      Possible asthma, HCVD; less likely CAD (Noted 03/15/09)   ??? Essential hypertension, benign    ??? Other and unspecified hyperlipidemia     ??? Pre-operative cardiovascular examination      For spine surgery   ??? Obesity, unspecified    ??? Meralgia paraesthetica 10/22/2013     Allergies   Allergen Reactions   ??? Asa-Acetaminophen-Caff-Potass Other (comments)     Bleeding in stomach   ??? Benadryl [Diphenhydramine Hcl] Other (comments)     Muscle jerking   ??? Other Plant, Animal, Environmental Not Reported This Time     Grass, Dust and Mold   ??? Pollen Extracts Not Reported This Time     Current Outpatient Prescriptions   Medication Sig Dispense Refill   ??? CRESTOR 40 mg tablet take 1 tablet by mouth once daily 30 Tab 0   ??? sucralfate (CARAFATE) 1 gram tablet Take 1 g by mouth four (4) times daily.     ??? valsartan-hydrochlorothiazide (DIOVAN-HCT) 160-25 mg per tablet Take 1 Tab by mouth daily. 30 Tab 3   ??? omeprazole (PRILOSEC) 40 mg capsule take 1 capsule by mouth once daily BEFORE BREAKFAST 30 Cap 5   ??? ranitidine (ZANTAC) 150 mg tablet Take 1 Tab by mouth two (2) times a day for 10 days. 20 Tab 0   ??? levothyroxine (SYNTHROID) 150 mcg tablet TAKE 1 TABLET BY MOUTH DAILY BEFORE BREAKFAST  30 Tab 0   ??? montelukast (SINGULAIR) 10 mg tablet Take 1 Tab by mouth nightly as needed. Indications: asthma 90 Tab 3   ??? gabapentin (NEURONTIN) 300 mg capsule Take 2 capsules at bedtime and 1 capsule in the morning. 90 Cap 3   ??? albuterol (PROVENTIL HFA) 90 mcg/actuation inhaler Take 2 Puffs by inhalation every six (6) hours as needed for Wheezing. 1 Inhaler 5   ??? budesonide-formoterol (SYMBICORT) 160-4.5 mcg/actuation HFA inhaler Take 2 Puffs by inhalation two (2) times a day. 1 Inhaler 5   ??? IRON DEXTRAN COMPLEX (IRON DEXTRAN IV) by IntraVENous route.     ??? CYANOCOBALAMIN, VITAMIN B-12, (VITAMIN B-12 PO) Take 1 Tab by mouth daily.     ??? GLUCOSAMINE SULFATE (GLUCOSAMINE PO) Take 1 Tab by mouth daily.     ??? Cholecalciferol, Vitamin D3, (VITAMIN D-3) 5,000 unit Tab Take  by mouth daily.     ??? thiamine (VITAMIN B-1) 100 mg tablet Take  by mouth daily.      ??? valsartan (DIOVAN) 160 mg tablet take 1 tablet by mouth once daily 30 Tab 1   ??? pantoprazole (PROTONIX) 40 mg tablet Take 1 Tab by mouth daily. 30 Tab 0          ROS:  Constitutional: Negative for fever, chills, or fatigue  Neurological: Negative for headache, dizziness, visual disturbance, or loss of conciousness  Respiratory: Negative for SOB, hemoptysis, or wheezing  Cardiovascular: Positive for mild intermittent chest pain though none today. Negative for palpitation or leg swelling  Gastrointestinal: Negative for abdominal pain, nausea, or vomiting  Musculoskeletal: Negative for falls      Physical Exam:  BP 138/60 mmHg   Pulse 96   Temp(Src) 98.1 ??F (36.7 ??C) (Oral)   Resp 18   Ht 5\' 2"  (1.575 m)   Wt 196 lb (88.905 kg)   BMI 35.84 kg/m2   SpO2 98%  General: Obese black female, a & o x 3, afebrile, interacting appropriately, in no acute distress  Skin: warm and dry, no rashes , no bruises  Neck: supple, symmetrical, no thyromegaly  Respiratory: symmetrical chest expansion, lung sounds clear bilaterally, good air entry, good respiratory effort, no wheezes or crackles  Cardiovascular: normal S1S2, regular rate and rhythm, no murmurs, pulses palpable, no thrill, no carotid or abdominal bruits, no peripheral edema, no JVD  Abdomen: non-distended, normoactive bowel sounds x 4 quadrants, soft, non-tender to palpation  Musculoskeletal: normal ROM on all joints, no swelling or deformity, no perilumbar tenderness, steady gait      Assessment/Plan:    ICD-9-CM ICD-10-CM    1. HTN (hypertension), benign 401.1 I10 Controlled  Continue current meds and was counseled on low salt diet   2. Pre-diabetes 790.29 R73.09 Recent HBA1C was 6.4  Counseled on diet and exercise   3. Obesity (BMI 30-39.9) 278.00 E66.9 Counseled on diet and exercise   4. Abnormal stress test 794.39 R94.39 Scheduled for stress echo versus subsequent catheterization and followed by Dr. Marchelle Folks       Orders Placed This Encounter   ??? HEMOGLOBIN A1C      Standing Status: Future      Number of Occurrences:       Standing Expiration Date: 11/14/2014   ??? [id number] [facility] DRAW FEE     Standing Status: Future      Number of Occurrences:       Standing Expiration Date: 01/13/2015   ??? [id number] - [facility] COLLECTION     Standing Status:  Future      Number of Occurrences:       Standing Expiration Date: 01/13/2015         Additional Notes: Discussed today's diagnosis, treatment plans. Discussed medication indications and side effects.    Weight Management:Counseled  After Visit Summary: Discussed provided printed patient instructions. Answered questions accordingly.  Follow-up Disposition: In 3 months with labs 1 week prior          Osie Cheeks, DO, MPH  Internal Medicine

## 2014-07-17 NOTE — Patient Instructions (Addendum)
DASH Diet: After Your Visit  Your Care Instructions  The DASH diet is an eating plan that can help lower your blood pressure. DASH stands for Dietary Approaches to Stop Hypertension. Hypertension is high blood pressure.  The DASH diet focuses on eating foods that are high in calcium, potassium, and magnesium. These nutrients can lower blood pressure. The foods that are highest in these nutrients are fruits, vegetables, low-fat dairy products, nuts, seeds, and legumes. But taking calcium, potassium, and magnesium supplements instead of eating foods that are high in those nutrients does not have the same effect. The DASH diet also includes whole grains, fish, and poultry.  The DASH diet is one of several lifestyle changes your doctor may recommend to lower your high blood pressure. Your doctor may also want you to decrease the amount of sodium in your diet. Lowering sodium while following the DASH diet can lower blood pressure even further than just the DASH diet alone.  Follow-up care is a key part of your treatment and safety. Be sure to make and go to all appointments, and call your doctor if you are having problems. It's also a good idea to know your test results and keep a list of the medicines you take.  How can you care for yourself at home?  Following the DASH diet  ?? Eat 4 to 5 servings of fruit each day. A serving is 1 medium-sized piece of fruit, ?? cup chopped or canned fruit, 1/4 cup dried fruit, or 4 ounces (?? cup) of fruit juice. Choose fruit more often than fruit juice.  ?? Eat 4 to 5 servings of vegetables each day. A serving is 1 cup of lettuce or raw leafy vegetables, ?? cup of chopped or cooked vegetables, or 4 ounces (?? cup) of vegetable juice. Choose vegetables more often than vegetable juice.  ?? Get 2 to 3 servings of low-fat and fat-free dairy each day. A serving is 8 ounces of milk, 1 cup of yogurt, or 1 ?? ounces of cheese.   ?? Eat 6 to 8 servings of grains each day. A serving is 1 slice of bread, 1 ounce of dry cereal, or ?? cup of cooked rice, pasta, or cooked cereal. Try to choose whole-grain products as much as possible.  ?? Limit lean meat, poultry, and fish to 2 servings each day. A serving is 3 ounces, about the size of a deck of cards.  ?? Eat 4 to 5 servings of nuts, seeds, and legumes (cooked dried beans, lentils, and split peas) each week. A serving is 1/3 cup of nuts, 2 tablespoons of seeds, or ?? cup of cooked beans or peas.  ?? Limit fats and oils to 2 to 3 servings each day. A serving is 1 teaspoon of vegetable oil or 2 tablespoons of salad dressing.  ?? Limit sweets and added sugars to 5 servings or less a week. A serving is 1 tablespoon jelly or jam, ?? cup sorbet, or 1 cup of lemonade.  ?? Eat less than 2,300 milligrams (mg) of sodium a day. If you have high blood pressure, diabetes, or chronic kidney disease, if you are African-American, or if you are older than age 50, try to limit the amount of sodium you eat to less than 1,500 mg a day.  Tips for success  ?? Start small. Do not try to make dramatic changes to your diet all at once. You might feel that you are missing out on your favorite foods and then be more   likely to not follow the plan. Make small changes, and stick with them. Once those changes become habit, add a few more changes.  ?? Try some of the following:  ?? Make it a goal to eat a fruit or vegetable at every meal and at snacks. This will make it easy to get the recommended amount of fruits and vegetables each day.  ?? Try yogurt topped with fruit and nuts for a snack or healthy dessert.  ?? Add lettuce, tomato, cucumber, and onion to sandwiches.  ?? Combine a ready-made pizza crust with low-fat mozzarella cheese and lots of vegetable toppings. Try using tomatoes, squash, spinach, broccoli, carrots, cauliflower, and onions.  ?? Have a variety of cut-up vegetables with a low-fat dip as an appetizer  instead of chips and dip.  ?? Sprinkle sunflower seeds or chopped almonds over salads. Or try adding chopped walnuts or almonds to cooked vegetables.  ?? Try some vegetarian meals using beans and peas. Add garbanzo or kidney beans to salads. Make burritos and tacos with mashed pinto beans or black beans.   Where can you learn more?   Go to http://www.healthwise.net/[facility]  Enter H967 in the search box to learn more about "DASH Diet: After Your Visit."   ?? 2006-2015 Healthwise, Inc[employer]. Care instructions adapted under license by [employer] (which disclaims liability or warranty for this information). This care instruction is for use with your licensed healthcare professional. If you have questions about a medical condition or this instruction, always ask your healthcare professional. Healthwise, Incorporated disclaims any warranty or liability for your use of this information.  Content Version: 10.5.422740; Current as of: January 26, 2014              DASH Diet: After Your Visit  Your Care Instructions  The DASH diet is an eating plan that can help lower your blood pressure. DASH stands for Dietary Approaches to Stop Hypertension. Hypertension is high blood pressure.  The DASH diet focuses on eating foods that are high in calcium, potassium, and magnesium. These nutrients can lower blood pressure. The foods that are highest in these nutrients are fruits, vegetables, low-fat dairy products, nuts, seeds, and legumes. But taking calcium, potassium, and magnesium supplements instead of eating foods that are high in those nutrients does not have the same effect. The DASH diet also includes whole grains, fish, and poultry.  The DASH diet is one of several lifestyle changes your doctor may recommend to lower your high blood pressure. Your doctor may also want you to decrease the amount of sodium in your diet. Lowering sodium while following the DASH diet can lower blood pressure even further than just  the DASH diet alone.  Follow-up care is a key part of your treatment and safety. Be sure to make and go to all appointments, and call your doctor if you are having problems. It's also a good idea to know your test results and keep a list of the medicines you take.  How can you care for yourself at home?  Following the DASH diet  ?? Eat 4 to 5 servings of fruit each day. A serving is 1 medium-sized piece of fruit, ?? cup chopped or canned fruit, 1/4 cup dried fruit, or 4 ounces (?? cup) of fruit juice. Choose fruit more often than fruit juice.  ?? Eat 4 to 5 servings of vegetables each day. A serving is 1 cup of lettuce or raw leafy vegetables, ?? cup of chopped or cooked vegetables, or 4   ounces (?? cup) of vegetable juice. Choose vegetables more often than vegetable juice.  ?? Get 2 to 3 servings of low-fat and fat-free dairy each day. A serving is 8 ounces of milk, 1 cup of yogurt, or 1 ?? ounces of cheese.  ?? Eat 6 to 8 servings of grains each day. A serving is 1 slice of bread, 1 ounce of dry cereal, or ?? cup of cooked rice, pasta, or cooked cereal. Try to choose whole-grain products as much as possible.  ?? Limit lean meat, poultry, and fish to 2 servings each day. A serving is 3 ounces, about the size of a deck of cards.  ?? Eat 4 to 5 servings of nuts, seeds, and legumes (cooked dried beans, lentils, and split peas) each week. A serving is 1/3 cup of nuts, 2 tablespoons of seeds, or ?? cup of cooked beans or peas.  ?? Limit fats and oils to 2 to 3 servings each day. A serving is 1 teaspoon of vegetable oil or 2 tablespoons of salad dressing.  ?? Limit sweets and added sugars to 5 servings or less a week. A serving is 1 tablespoon jelly or jam, ?? cup sorbet, or 1 cup of lemonade.  ?? Eat less than 2,300 milligrams (mg) of sodium a day. If you have high blood pressure, diabetes, or chronic kidney disease, if you are African-American, or if you are older than age 69, try to limit the amount  of sodium you eat to less than 1,500 mg a day.  Tips for success  ?? Start small. Do not try to make dramatic changes to your diet all at once. You might feel that you are missing out on your favorite foods and then be more likely to not follow the plan. Make small changes, and stick with them. Once those changes become habit, add a few more changes.  ?? Try some of the following:  ?? Make it a goal to eat a fruit or vegetable at every meal and at snacks. This will make it easy to get the recommended amount of fruits and vegetables each day.  ?? Try yogurt topped with fruit and nuts for a snack or healthy dessert.  ?? Add lettuce, tomato, cucumber, and onion to sandwiches.  ?? Combine a ready-made pizza crust with low-fat mozzarella cheese and lots of vegetable toppings. Try using tomatoes, squash, spinach, broccoli, carrots, cauliflower, and onions.  ?? Have a variety of cut-up vegetables with a low-fat dip as an appetizer instead of chips and dip.  ?? Sprinkle sunflower seeds or chopped almonds over salads. Or try adding chopped walnuts or almonds to cooked vegetables.  ?? Try some vegetarian meals using beans and peas. Add garbanzo or kidney beans to salads. Make burritos and tacos with mashed pinto beans or black beans.   Where can you learn more?   Go to [employer]  Enter H967 in the search box to learn more about "DASH Diet: After Your Visit."   ?? 2006-2015 Healthwise, Incorporated. Care instructions adapted under license by [employer] (which disclaims liability or warranty for this information). This care instruction is for use with your licensed healthcare professional. If you have questions about a medical condition or this instruction, always ask your healthcare professional. [place] any warranty or liability for your use of this information.  Content Version: 10.5.422740; Current as of: January 26, 2014              Prediabetes: After Your Visit  Your Care Instructions  Prediabetes is a warning sign that you are at risk for getting type 2 diabetes. It means that your blood sugar is higher than it should be. Most people who get type 2 diabetes have prediabetes first. The good news is that lifestyle changes may help you get your blood sugar back to normal and avoid or delay diabetes. Also, pregnant women who get gestational diabetes may have prediabetes first.  Type 2 diabetes is a lifelong disease in which the body does not respond properly to a hormone called insulin or does not make enough of the hormone. Insulin helps sugar from your food enter your body cells to be used as energy.  Without insulin, the sugar cannot get into the cells to do its work. It stays in the blood instead. This can cause high blood sugar levels. A person has diabetes when the blood sugar stays too high too much of the time.  Follow-up care is a key part of your treatment and safety. Be sure to make and go to all appointments, and call your doctor if you are having problems. It???s also a good idea to know your test results and keep a list of the medicines you take.  How can you care for yourself at home?  ?? Watch your weight. A healthy weight helps your body use insulin properly.  ?? Eat a balanced diet. This may help you prevent or delay diabetes. Try to eat an even amount of carbohydrate throughout the day. This can help you avoid sudden peaks in blood sugar.  ?? Ask your doctor if you should see a dietitian. A registered dietitian can help you develop a meal plan that fits your lifestyle.  ?? Get at least 30 minutes of exercise on most days of the week. Exercise helps control your blood sugar. It also helps you maintain a healthy weight. Walking is a good choice. You also may want to do other activities, such as running, swimming, cycling, or playing tennis or team sports.  ?? Do not smoke. Smoking can make prediabetes worse. If you need help  quitting, talk to your doctor about stop-smoking programs and medicines. These can increase your chances of quitting for good.  ?? If your doctor prescribed medicines, take them exactly as prescribed. Call your doctor if you think you are having a problem with your medicine. You will get more details on the specific medicines your doctor prescribes.  When should you call for help?  Watch closely for changes in your health, and be sure to contact your doctor if:  ?? You have any symptoms of diabetes. These may include:  ?? Being thirsty more often.  ?? Urinating more.  ?? Being hungrier.  ?? Losing weight.  ?? Being very tired.  ?? Having blurry vision.  ?? You have a wound that will not heal.  ?? You have an infection that will not go away.  ?? You have problems with your blood pressure.  ?? You want more information about diabetes and how you can keep from getting it.   Where can you learn more?   Go to [employer]  Enter I222 in the search box to learn more about "Prediabetes: After Your Visit."   ?? 2006-2015 Healthwise, Incorporated. Care instructions adapted under license by [employer] (which disclaims liability or warranty for this information). This care instruction is for use with your licensed healthcare professional. If you have questions about a medical condition or this instruction, always ask your  healthcare professional. [place] any warranty or liability for your use of this information.  Content Version: 10.5.422740; Current as of: October 20, 2013

## 2014-07-17 NOTE — Progress Notes (Signed)
Patient in office today for 1 week follow up. Patient is not taking the Protonix, and Diovan.    Do you have an Advance Directive? No  Would you like more information about Advance Directive? Previously given    1. Have you been to the ER, urgent care clinic since your last visit?  Hospitalized since your last visit? Yes, Patient was sent to the ER at her last OV.    2. Have you seen or consulted any other health care providers outside of the [facility] since your last visit?  Include any pap smears or colon screening. Yes, Patient saw Dr. Hinda Kehr and Dr. Angelique Holm (Gastrointestinal)

## 2014-08-03 MED ORDER — SYNTHROID 150 MCG TABLET
150 mcg | ORAL_TABLET | ORAL | Status: DC
Start: 2014-08-03 — End: 2014-10-23

## 2014-08-18 ENCOUNTER — Encounter

## 2014-08-20 MED ORDER — CRESTOR 40 MG TABLET
40 mg | ORAL_TABLET | ORAL | Status: DC
Start: 2014-08-20 — End: 2014-09-24

## 2014-08-20 NOTE — Telephone Encounter (Signed)
Left message that her Crestor was refilled and sent to the pharmacy.

## 2014-08-20 NOTE — Telephone Encounter (Signed)
Crestor refilled and sent to pharmacy. Please notify pt.

## 2014-09-24 MED ORDER — ROSUVASTATIN 40 MG TAB
40 mg | ORAL_TABLET | Freq: Every day | ORAL | Status: DC
Start: 2014-09-24 — End: 2014-12-24

## 2014-10-02 ENCOUNTER — Encounter

## 2014-10-02 MED ORDER — GABAPENTIN 300 MG CAP
300 mg | ORAL_CAPSULE | ORAL | Status: DC
Start: 2014-10-02 — End: 2015-03-04

## 2014-10-02 NOTE — Telephone Encounter (Signed)
Neurontin refilled and sent to pharmacy. Please notify pt.

## 2014-10-02 NOTE — Telephone Encounter (Signed)
Last OV 07/17/2014

## 2014-10-02 NOTE — Telephone Encounter (Signed)
Left message on patient's personal vm that her medication was refilled and sent to the pharmacy.

## 2014-10-09 ENCOUNTER — Encounter: Attending: Internal Medicine | Primary: Internal Medicine

## 2014-10-09 LAB — PT/INR EXTERNAL
INR, External: 1
Prothrombin time, External: 10

## 2014-10-10 NOTE — Procedures (Unsigned)
Patient Name: Elaine [name]  Account Number: [id number]  Date of Birth: 03-01-1951  Record Number: [id number]  Date of Procedure: 10/10/2014  Referring Physician(s): Hermenia Fiscal Forrest  Endoscopist: Lyman Speller    PROCEDURE PERFORMED       EUS with  EGD and submucosal saline injection.      INDICATIONS FOR EXAMINATION:    Duodenal carcinoid.  EUS evaluation to   determine feasibility of tandem EGD with EMR .    Instruments:  [id number]  Medications: Propofol-based IV anesthesia  Complications:  None  Extent of Exam: second part of duodenum  Classes: ASA Class II  Mallampati Class II  Estimated Blood Loss: None  Extent of Exam: second part of duodenum  Prep (for Colonoscopy):  Withdrawal Time (for Colonoscopy):    PROCEDURE TECHNIQUE: The patient was placed in the left lateral decubitus   position.   The Olympus radial scanning echoendoscope was inserted into the   patient's oropharynx and the esophagus was intubated without difficulty.  The   endoscope was advanced to the 2nd portion of the duodenum under direct vision.    The duodenal bulb was filled with de-aerated water to achieve acoutic   coupling and imaging of the desired anomaly was performed.  Upon completion of   EUS evaluation,  a GIF-H180 videogastroscope was inserted into the esophagus   and scope advanced into the duodenal bulb.  Upon completion of the EGD portion   the scope was removed and procedure terminated.  The patient was transferred   to the recovery area in a stable condition.  FINDINGS:  1.  Esophagus -  Z-line was located at 38 cm.  Normal mucosa.  2.  Stomach - normal.  3.  Duodenum - 9-10 mm polypoid lesion at the inferior wall of the duodenal   bulb. There was a central ulcerated depression from prior biopsy  Normal 2nd   portion.  4.  Radial EUS evaluation of the duodenal polypoid lesion was performed using   10-12 MHz frequency with duodenal bulb filled with de-aerated water.     Isoechoic polypoid lesion arising from the lamina propia layer of the inferior   duodenal wall was identified.  This measured between 7-8 mm.  It is not   invading the muscularis propria.  There is minimal vascular venous signal at   the base of the polyp.  5.  Using the GIF-H180 scope,  a total of 9 mL of sterile saline was injected   into the base of this duodenal polyp with positive lifting achieved but saline   pillow could not hold and easily dissipated  precluding safe polypectomy.  At   this point it was decided to abandon polypectomy attempt.  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  Attempt to perform submucosal saline injection assisted   polypectomy was aborted due to inadequate maintenance of a saline pillow.  RECOMMENDATIONS:  1.  Will discuss with patient, son and Dr. Angelique Holm.  I think she may benefit   from EGD and EMR via band ligation technique that will have to be scheduled   with surgical back-up given the risks of perforation and bleeding.  Her other   option would be laparoscopic/Robotic assisted duodenal polypectomy by a   general surgeon.  2.  Resume diet and home meds.  3.  She will arrange ffup with Dr. Missy Sabins.  COMMENTS:    ICD CODES:  Signature:_________________________________ Lyman Speller , M.D.        This Procedure was electronically signed off on  10/10/2014 2:57:06 PM By Lyman Speller M.D.

## 2014-10-16 ENCOUNTER — Encounter: Attending: Internal Medicine | Primary: Internal Medicine

## 2014-10-17 ENCOUNTER — Encounter: Attending: Internal Medicine | Primary: Internal Medicine

## 2014-10-20 ENCOUNTER — Inpatient Hospital Stay: Admit: 2014-10-20 | Primary: Internal Medicine

## 2014-10-22 LAB — HEMOGLOBIN A1C WITH EAG: Hemoglobin A1c: 5.8 % — ABNORMAL HIGH (ref 4.8–5.6)

## 2014-10-23 ENCOUNTER — Encounter

## 2014-10-24 ENCOUNTER — Ambulatory Visit
Admit: 2014-10-24 | Discharge: 2014-10-24 | Payer: PRIVATE HEALTH INSURANCE | Attending: Internal Medicine | Primary: Internal Medicine

## 2014-10-24 DIAGNOSIS — R7303 Prediabetes: Secondary | ICD-10-CM

## 2014-10-24 MED ORDER — ALBUTEROL SULFATE HFA 90 MCG/ACTUATION AEROSOL INHALER
90 mcg/actuation | Freq: Four times a day (QID) | RESPIRATORY_TRACT | Status: DC | PRN
Start: 2014-10-24 — End: 2015-07-08

## 2014-10-24 MED ORDER — SYNTHROID 150 MCG TABLET
150 mcg | ORAL_TABLET | ORAL | Status: DC
Start: 2014-10-24 — End: 2014-11-26

## 2014-10-24 NOTE — Telephone Encounter (Signed)
Synthroid refilled and sent to pharmacy. Please notify pt.

## 2014-10-24 NOTE — Patient Instructions (Addendum)
Vaccine Information Statement    Influenza (Flu) Vaccine (Inactivated or Recombinant): What you need to know    Many Vaccine Information Statements are available in Spanish and other languages. See www.immunize.org/vis  Hojas de Informaci??n Sobre Vacunas est??n disponibles en Espa??ol y en muchos otros idiomas. Visite www.immunize.org/vis    1. Why get vaccinated?    Influenza (???flu???) is a contagious disease that spreads around the [place] every year, usually between October and May.     Flu is caused by influenza viruses, and is spread mainly by coughing, sneezing, and close contact.     Anyone can get flu. Flu strikes suddenly and can last several days. Symptoms vary by age, but can include:  ??? fever/chills  ??? sore throat  ??? muscle aches  ??? fatigue  ??? cough  ??? headache   ??? runny or stuffy nose    Flu can also lead to pneumonia and blood infections, and cause diarrhea and seizures in children.  If you have a medical condition, such as heart or lung disease, flu can make it worse.    Flu is more dangerous for some people. Infants and young children, people 65 years of age and older, pregnant women, and people with certain health conditions or a weakened immune system are at greatest risk.      Each year thousands of people in the [place] die from flu, and many more are hospitalized.     Flu vaccine can:  ??? keep you from getting flu,  ??? make flu less severe if you do get it, and  ??? keep you from spreading flu to your family and other people.     2. Inactivated and recombinant flu vaccines    A dose of flu vaccine is recommended every flu season. Children 6 months through 8 years of age may need two doses during the same flu season.  Everyone else needs only one dose each flu season.       Some inactivated flu vaccines contain a very small amount of a mercury-based preservative called thimerosal. Studies have not shown thimerosal in vaccines to be harmful, but flu vaccines that do not contain  thimerosal are available.    There is no live flu virus in flu shots.  They cannot cause the flu.     There are many flu viruses, and they are always changing. Each year a new flu vaccine is made to protect against three or four viruses that are likely to cause disease in the upcoming flu season. But even when the vaccine doesn???t exactly match these viruses, it may still provide some protection    Flu vaccine cannot prevent:  ??? flu that is caused by a virus not covered by the vaccine, or  ??? illnesses that look like flu but are not.    It takes about 2 weeks for protection to develop after vaccination, and protection lasts through the flu season.     3. Some people should not get this vaccine    Tell the person who is giving you the vaccine:    ??? If you have any severe, life-threatening allergies.    If you ever had a life-threatening allergic reaction after a dose of flu vaccine, or have a severe allergy to any part of this vaccine, you may be advised not to get vaccinated.  Most, but not all, types of flu vaccine contain a small amount of egg protein.       ??? If you   ever had Guillain-Barr?? Syndrome (also called GBS).   Some people with a history of GBS should not get this vaccine. This should be discussed with your doctor.    ??? If you are not feeling well.    It is usually okay to get flu vaccine when you have a mild illness, but you might be asked to come back when you feel better.      4. Risks of a vaccine reaction    With any medicine, including vaccines, there is a chance of reactions. These are usually mild and go away on their own, but serious reactions are also possible.     Most people who get a flu shot do not have any problems with it.     Minor problems following a flu shot include:   ??? soreness, redness, or swelling where the shot was given    ??? hoarseness  ??? sore, red or itchy eyes  ??? cough  ??? fever  ??? aches  ??? headache  ??? itching  ??? fatigue   If these problems occur, they usually begin soon after the shot and last 1 or 2 days.     More serious problems following a flu shot can include the following:    ??? There may be a small increased risk of Guillain-Barr?? Syndrome (GBS) after inactivated flu vaccine.  This risk has been estimated at 1 or 2 additional cases per million people vaccinated. This is much lower than the risk of severe complications from flu, which can be prevented by flu vaccine.      ??? Young children who get the flu shot along with pneumococcal vaccine (PCV13) and/or DTaP vaccine at the same time might be slightly more likely to have a seizure caused by fever. Ask your doctor for more information. Tell your doctor if a child who is getting flu vaccine has ever had a seizure.     Problems that could happen after any injected vaccine:     ??? People sometimes faint after a medical procedure, including vaccination. Sitting or lying down for about 15 minutes can help prevent fainting, and injuries caused by a fall. Tell your doctor if you feel dizzy, or have vision changes or ringing in the ears.    ??? Some people get severe pain in the shoulder and have difficulty moving the arm where a shot was given. This happens very rarely.    ??? Any medication can cause a severe allergic reaction. Such reactions from a vaccine are very rare, estimated at about 1 in a million doses, and would happen within a few minutes to a few hours after the vaccination.    As with any medicine, there is a very remote chance of a vaccine causing a serious injury or death.    The safety of vaccines is always being monitored. For more information, visit: www.cdc.gov/vaccinesafety/    5. What if there is a serious reaction?    What should I look for?    ??? Look for anything that concerns you, such as signs of a severe allergic reaction, very high fever, or unusual behavior.    Signs of a severe allergic reaction can include hives, swelling of the  face and throat, difficulty breathing, a fast heartbeat, dizziness, and weakness ??? usually within a few minutes to a few hours after the vaccination.    What should I do?    ??? If you think it is a severe allergic reaction or other emergency that   can???t wait, call 9-1-1 and get the person to the nearest hospital. Otherwise, call your doctor.    ??? Reactions should be reported to the Vaccine Adverse Event Reporting System (VAERS). Your doctor should file this report, or you can do it yourself through  the VAERS web site at www.vaers.hhs.gov, or by calling 1-800-822-7967.    VAERS does not give medical advice.    6. The National Vaccine Injury Compensation Program    The National Vaccine Injury Compensation Program (VICP) is a federal program that was created to compensate people who may have been injured by certain vaccines.    Persons who believe they may have been injured by a vaccine can learn about the program and about filing a claim by calling 1-800-338-2382 or visiting the VICP website at www.hrsa.gov/vaccinecompensation.  There is a time limit to file a claim for compensation.    7. How can I learn more?  ??? Ask your healthcare provider. He or she can give you the vaccine package insert or suggest other sources of information.  ??? Call your local or state health department.  ??? Contact the Centers for Disease Control and Prevention (CDC):  - Call 1-800-232-4636 (1-800-CDC-INFO) or  - Visit CDC???s website at www.cdc.gov/flu    Vaccine Information Statement   Inactivated Influenza Vaccine   07/13/2014  42 [place] ?? [id number]    Department of Health and Human Services  Centers for Disease Control and Prevention    Office Use Only    Influenza (Flu) Vaccine (Inactivated or Recombinant): What you need to know 2014-15  Why get vaccinated?  Influenza ("flu") is a contagious disease that spreads around the [place] every winter, usually between October and May.   Flu is caused by influenza viruses and is spread mainly by coughing, sneezing, and close contact.  Anyone can get the flu, but the risk of getting flu is highest among children. Symptoms come on suddenly and may last several days. They can include:  ?? Fever/chills.  ?? Sore throat.  ?? Muscle aches.  ?? Fatigue.  ?? Cough.  ?? Headache.  ?? Runny or stuffy nose.  Flu can make some people much sicker than others. These people include young children, people 65 and older, pregnant women, and people with certain health conditions???such as heart, lung or kidney disease; nervous system disorders; or a weakened immune system. Flu vaccination is especially important for these people and anyone in close contact with them.  Flu can also lead to pneumonia and can make existing medical conditions worse. It can cause diarrhea and seizures in children.  Each year thousands of people in the [place] die from flu, and many more are hospitalized.  Flu vaccine is the best protection we have from flu and its complications. Flu vaccine also helps prevent spreading flu from person to person.  Inactivated and recombinant flu vaccines  You are getting an injectable flu vaccine, which is either an "inactivated" or "recombinant" vaccine. These vaccines do not contain any live influenza virus. They are given by injection with a needle and are often called the "flu shot."  A different, live, attenuated (weakened) influenza vaccine is sprayed into the nostrils. This vaccine is described in a separate Vaccine Information Statement.  Flu vaccination is recommended every year. Some children 6 months through 8 years of age might need two doses during one year.  Flu viruses are always changing. Each year's flu vaccine is made to protect against three or four viruses that are likely   to cause disease that year. Flu vaccine cannot prevent all cases of flu, but it is the best defense against the disease.   It takes about 2 weeks for protection to develop after the vaccination, and protection lasts several months to a year.  Some illnesses that are not caused by influenza virus are often mistaken for flu. Flu vaccine will not prevent these illnesses. It can only prevent influenza.  Some inactivated flu vaccine contains a very small amount of a mercury-based preservative called thimerosal. Studies have shown that thimerosal in vaccines is not harmful, but flu vaccines that do not contain a preservative are available.  Some people should not get this vaccine  Tell the person who gives you the vaccine:  ?? If you have any severe (life-threatening) allergies. If you ever had a life-threatening allergic reaction after a dose of flu vaccine or have a severe allergy to any part of this vaccine, including (for example) an allergy to gelatin, antibiotics, or eggs, you may be advised not to get vaccinated. Most, but not all, types of flu vaccine contain a small amount of egg protein.  ?? If you ever had Guillain-Barr?? syndrome (a severe paralyzing illness, also called GBS). Some people with a history of GBS should not get this vaccine. This should be discussed with your doctor.  ?? If you are not feeling well. It is usually okay to get flu vaccine when you have a mild illness, but you might be advised to wait until you feel better. You should come back when you are better.  Risks of a vaccine reaction  With a vaccine, like any medicine, there is a chance of side effects. These are usually mild and go away on their own.  Problems that could happen after any vaccine:  ?? Brief fainting spells can happen after any medical procedure, including vaccination. Sitting or lying down for about 15 minutes can help prevent fainting and the injuries caused by a fall. Tell your doctor if you feel dizzy or have vision changes or ringing in the ears.  ?? Severe shoulder pain and reduced range of motion in the arm where a shot  was given can happen, very rarely, after a vaccination.  ?? Severe allergic reactions from a vaccine are very rare, estimated at less than 1 in a million doses. If one were to occur, it would usually be within a few minutes to a few hours after the vaccination.  Mild problems following inactivated flu vaccine:  ?? Soreness, redness, or swelling where the shot was given  ?? Hoarseness  ?? Sore, red or itchy eyes  ?? Cough  ?? Fever  ?? Aches  ?? Headache  ?? Itching  ?? Fatigue  If these problems occur, they usually begin soon after the shot and last 1or 2 days.  Moderate problems following inactivated flu vaccine:  ?? Young children who get inactivated flu vaccine and pneumococcal vaccine (PCV13) at the same time may be at increased risk for seizures caused by fever. Ask your doctor for more information. Tell your doctor if a child who is getting flu vaccine has ever had a seizure.  Inactivated flu vaccine does not contain live flu virus, so you cannot get the flu from this vaccine.  As with any medicine, there is a very remote chance of a vaccine causing a serious injury or death.  The safety of vaccines is always being monitored. For more information, visit: www.cdc.gov/vaccinesafety/  What if there is a serious reaction?    What should I look for?  ?? Look for anything that concerns you, such as signs of a severe allergic reaction, very high fever, or behavior changes.  Signs of a severe allergic reaction can include hives, swelling of the face and throat, difficulty breathing, a fast heartbeat, dizziness, and weakness. These would usually start a few minutes to a few hours after the vaccination.  What should I do?  ?? If you think it is a severe allergic reaction or other emergency that can't wait, call 9-1-1 and get the person to the nearest hospital. Otherwise, call your doctor.  ?? Afterward, the reaction should be reported to the "Vaccine Adverse Event  Reporting System" (VAERS). Your doctor should file this report, or you can do it yourself through the VAERS website at www.vaers.SamedayNews.es, or by calling 352-092-0886.  VAERS does not give medical advice.  The National Vaccine Injury Compensation Program  The National Vaccine Injury Compensation Program (VICP) is a federal program that was created to compensate people who may have been injured by certain vaccines.  Persons who believe they may have been injured by a vaccine can learn about the program and about filing a claim by calling (541) 797-6388 or visiting the [place] website at [email or web address]. There is a time limit to file a claim for compensation.  How can I learn more?  ?? Ask your doctor.  ?? Call your local or state health department.  ?? Contact the Centers for Disease Control and Prevention (CDC):  ?? Call 336-213-5237 (1-800-CDC-INFO) or  ?? Visit CDC's website at https://gibson.com/  Vaccine Information Statement (Interim)  Inactivated Influenza Vaccine  (07/25/2013)  42 [place] ?? [phone]  Department of Health and [place] for Disease Control and Prevention  Many Vaccine Information Statements are available in Spanish and other languages. See AbsolutelyGenuine.com.br.  Muchas hojas de informaci??n sobre vacunas est??n disponibles en espa??ol y en otros idiomas. Visite AbsolutelyGenuine.com.br.  Content Version: 10.5.422740        Prediabetes: After Your Visit  Your Care Instructions  Prediabetes is a warning sign that you are at risk for getting type 2 diabetes. It means that your blood sugar is higher than it should be. Most people who get type 2 diabetes have prediabetes first. The good news is that lifestyle changes may help you get your blood sugar back to normal and avoid or delay diabetes. Also, pregnant women who get gestational diabetes may have prediabetes first.  Type 2 diabetes is a lifelong disease in which the body does not respond  properly to a hormone called insulin or does not make enough of the hormone. Insulin helps sugar from your food enter your body cells to be used as energy.  Without insulin, the sugar cannot get into the cells to do its work. It stays in the blood instead. This can cause high blood sugar levels. A person has diabetes when the blood sugar stays too high too much of the time.  Follow-up care is a key part of your treatment and safety. Be sure to make and go to all appointments, and call your doctor if you are having problems. It???s also a good idea to know your test results and keep a list of the medicines you take.  How can you care for yourself at home?  ?? Watch your weight. A healthy weight helps your body use insulin properly.  ?? Eat a balanced diet. This may help you prevent or delay diabetes. Try to eat an even amount of carbohydrate  throughout the day. This can help you avoid sudden peaks in blood sugar.  ?? Ask your doctor if you should see a dietitian. A registered dietitian can help you develop a meal plan that fits your lifestyle.  ?? Get at least 30 minutes of exercise on most days of the week. Exercise helps control your blood sugar. It also helps you maintain a healthy weight. Walking is a good choice. You also may want to do other activities, such as running, swimming, cycling, or playing tennis or team sports.  ?? Do not smoke. Smoking can make prediabetes worse. If you need help quitting, talk to your doctor about stop-smoking programs and medicines. These can increase your chances of quitting for good.  ?? If your doctor prescribed medicines, take them exactly as prescribed. Call your doctor if you think you are having a problem with your medicine. You will get more details on the specific medicines your doctor prescribes.  When should you call for help?  Watch closely for changes in your health, and be sure to contact your doctor if:  ?? You have any symptoms of diabetes. These may include:   ?? Being thirsty more often.  ?? Urinating more.  ?? Being hungrier.  ?? Losing weight.  ?? Being very tired.  ?? Having blurry vision.  ?? You have a wound that will not heal.  ?? You have an infection that will not go away.  ?? You have problems with your blood pressure.  ?? You want more information about diabetes and how you can keep from getting it.   Where can you learn more?   Go to GreenNylon.com.cy  Enter I222 in the search box to learn more about "Prediabetes: After Your Visit."   ?? 2006-2015 Healthwise, Incorporated. Care instructions adapted under license by [employer] (which disclaims liability or warranty for this information). This care instruction is for use with your licensed healthcare professional. If you have questions about a medical condition or this instruction, always ask your healthcare professional. [place] any warranty or liability for your use of this information.  Content Version: 10.5.422740; Current as of: October 20, 2013              Starting a Weight Loss Plan: After Your Visit  Your Care Instructions  If you are thinking about losing weight, it can be hard to know where to start. Your doctor can help you set up a weight loss plan that best meets your needs. You may want to take a class on nutrition or exercise, or join a weight loss support group. If you have questions about how to make changes to your eating or exercise habits, ask your doctor about seeing a registered dietitian or an exercise specialist.  It can be a big challenge to lose weight. But you do not have to make huge changes at once. Make small changes, and stick with them. When those changes become habit, add a few more changes.  If you do not think you are ready to make changes right now, try to pick a date in the future. Make an appointment to see your doctor to discuss whether the time is right for you to start a plan.   Follow-up care is a key part of your treatment and safety. Be sure to make and go to all appointments, and call your doctor if you are having problems. It???s also a good idea to know your test results and keep a list of the  medicines you take.  How can you care for yourself at home?  ?? Set realistic goals. Many people expect to lose much more weight than is likely. A weight loss of 5% to 10% of your body weight may be enough to improve your health.  ?? Get family and friends involved to provide support. Talk to them about why you are trying to lose weight, and ask them to help. They can help by participating in exercise and having meals with you, even if they may be eating something different.  ?? Find what works best for you. If you do not have time or do not like to cook, a program that offers meal replacement bars or shakes may be better for you. Or if you like to prepare meals, finding a plan that includes daily menus and recipes may be best.  ?? Ask your doctor about other health professionals who can help you achieve your weight loss goals.  ?? A dietitian can help you make healthy changes in your diet.  ?? An exercise specialist or personal trainer can help you develop a safe and effective exercise program.  ?? A counselor or psychiatrist can help you cope with issues such as depression, anxiety, or family problems that can make it hard to focus on weight loss.  ?? Consider joining a support group for people who are trying to lose weight. Your doctor can suggest groups in your area.   Where can you learn more?   Go to [email or web address].[email or web address]  Enter U357 in the search box to learn more about "Starting a Weight Loss Plan: After Your Visit."   ?? 2006-2015 Healthwise, Incorporated. Care instructions adapted under license by [employer] (which disclaims liability or warranty for this information). This care instruction is for use with your licensed  healthcare professional. If you have questions about a medical condition or this instruction, always ask your healthcare professional. [place] any warranty or liability for your use of this information.  Content Version: 10.5.422740; Current as of: January 26, 2014

## 2014-10-24 NOTE — Telephone Encounter (Signed)
Left message on vm that her synthroid was refilled and sent to the pharmacy.

## 2014-10-24 NOTE — Progress Notes (Signed)
Patient in office today for 3 month follow up. Patient is not taking Vitamin B12, Vitamin B1, Glucosamine, and Sulcrafate. Patient is now taking Ranitidine. Patient is complaining of pain to left. Administered influenza vaccine to right deltoid, tolerated well with no complaints noted.     Do you have an Advance Directive? No  Would you like more information about Advance Directive? Previously given       1. Have you been to the ER, urgent care clinic since your last visit?  Hospitalized since your last visit? No    2. Have you seen or consulted any other health care providers outside of the [facility] since your last visit?  Include any pap smears or colon screening.  No

## 2014-10-24 NOTE — Progress Notes (Signed)
Chief Complaint   Patient presents with   ??? Hypertension     6 month follow up       HPI:  Patient is a 63 year old African American female with medical problems listed below presents today for follow up of Hypertension, Prediabetes, Hyperlipidemia, Hypothyroidism, GERD, Vit D def, etc. She endorsed occasional mild intermittent SOB from her Asthma though none currently and wants refill of Albuterol inhaler today. Otherwise, she has been feeling well and voices no other complaints today. She is complaint with her medications with no adverse effects reported. She has gained 6 pounds in the last 3 months and stated that she had labs done few days at [facility] but results is not appearing in the medical records at this time.      Past Medical History   Diagnosis Date   ??? Iron deficiency anemia 8/93/8101   ??? Lichen planus 7/51/0258   ??? Hypertension    ??? Hypercholesterolemia    ??? Thyroid disease      hypothyroidism   ??? Asthma    ??? Right sided sciatica    ??? Colon polyp    ??? Depression 12/18/2011   ??? OA (osteoarthritis of spine) 02/15/2009   ??? DDD (degenerative disc disease) 02/15/2009   ??? OA (osteoarthritis of spine) 02/15/2009   ??? Sleep apnea 02/15/2009     uses cpap machine   ??? History of echocardiogram 03/20/2004     EF 70%.  Borderline DDfx.  No significant valvular pathology.   ??? Thallium stress test  03/20/2004     Partially transient, mod basal & mid anterior defect most c/w artifact; mild anterior ischemia less likely.  Neg EKG on pharm stress test.   ??? Lower extremity venous duplex 10/23/2009     Left leg:  No DVT.     ??? Anemia    ??? Shortness of breath      Possible asthma, HCVD; less likely CAD (Noted 03/15/09)   ??? Essential hypertension, benign    ??? Other and unspecified hyperlipidemia    ??? Pre-operative cardiovascular examination      For spine surgery   ??? Obesity, unspecified    ??? Meralgia paraesthetica 10/22/2013     Allergies   Allergen Reactions   ??? Asa-Acetaminophen-Caff-Potass Other (comments)      Bleeding in stomach   ??? Benadryl [Diphenhydramine Hcl] Other (comments)     Muscle jerking   ??? Other Plant, Animal, Environmental Not Reported This Time     Grass, Dust and Mold   ??? Pollen Extracts Not Reported This Time     Current Outpatient Prescriptions   Medication Sig Dispense Refill   ??? SYNTHROID 150 mcg tablet TAKE 1 TABLET BY MOUTH BEFORE BREAKFAST 30 Tab 0   ??? ranitidine (ZANTAC) 150 mg tablet Take 150 mg by mouth two (2) times a day.     ??? gabapentin (NEURONTIN) 300 mg capsule Take 2 capsules at bedtime and 1 capsule in the morning. 90 Cap 3   ??? rosuvastatin (CRESTOR) 40 mg tablet Take 1 Tab by mouth daily. 30 Tab 0   ??? valsartan-hydrochlorothiazide (DIOVAN-HCT) 160-25 mg per tablet Take 1 Tab by mouth daily. 30 Tab 3   ??? omeprazole (PRILOSEC) 40 mg capsule take 1 capsule by mouth once daily BEFORE BREAKFAST 30 Cap 5   ??? montelukast (SINGULAIR) 10 mg tablet Take 1 Tab by mouth nightly as needed. Indications: asthma 90 Tab 3   ??? albuterol (PROVENTIL HFA) 90 mcg/actuation inhaler Take  2 Puffs by inhalation every six (6) hours as needed for Wheezing. 1 Inhaler 5   ??? budesonide-formoterol (SYMBICORT) 160-4.5 mcg/actuation HFA inhaler Take 2 Puffs by inhalation two (2) times a day. 1 Inhaler 5   ??? IRON DEXTRAN COMPLEX (IRON DEXTRAN IV) by IntraVENous route.     ??? Cholecalciferol, Vitamin D3, (VITAMIN D-3) 5,000 unit Tab Take  by mouth daily.     ??? thiamine (VITAMIN B-1) 100 mg tablet Take  by mouth daily.     ??? sucralfate (CARAFATE) 1 gram tablet Take 1 g by mouth four (4) times daily.     ??? CYANOCOBALAMIN, VITAMIN B-12, (VITAMIN B-12 PO) Take 1 Tab by mouth daily.     ??? GLUCOSAMINE SULFATE (GLUCOSAMINE PO) Take 1 Tab by mouth daily.            ROS:  Constitutional: Negative for fever, chills, or fatigue  Neurological: Negative for headache, dizziness, visual disturbance, or loss of conciousness  Respiratory: Positive for occasional and mild intermittent SOB from Asthma though none currently   Cardiovascular: Negative for chest pain, palpitation, or leg swelling  Gastrointestinal: Negative for abdominal pain, nausea, or vomiting  Musculoskeletal: Negative for falls      Physical Exam:  BP 122/84 mmHg   Pulse 88   Temp(Src) 98 ??F (36.7 ??C) (Oral)   Resp 20   Ht 5\' 2"  (1.575 m)   Wt 202 lb (91.627 kg)   BMI 36.94 kg/m2   SpO2 95%  General: Obese black female, a & o x 3, afebrile, interacting appropriately, in no acute distress  Skin: warm and dry, no rashes , no bruises  Neck: supple, symmetrical, no thyromegaly  Respiratory: symmetrical chest expansion, lung sounds clear bilaterally, good air entry, good respiratory effort, no wheezes or crackles  Cardiovascular: normal S1S2, regular rate and rhythm, no murmurs, pulses palpable, no thrill, no carotid or abdominal bruits, no peripheral edema, no JVD  Abdomen: non-distended, normoactive bowel sounds x 4 quadrants, soft, non-tender to palpation  Musculoskeletal: normal ROM on all joints, no swelling or deformity, no perilumbar tenderness, steady gait      Assessment/Plan:    ICD-10-CM ICD-9-CM    1. Pre-diabetes R73.09 790.29 Counseled on diet and exercise and will check HBA1C at f/u in 3 months  HEMOGLOBIN A1C   2. HTN (hypertension), benign I10 401.1 Well controlled  Continue current meds and she was counseled on low salt diet         3. Hypercholesterolemia E78.0 272.0 Continue Crestor and she was counseled on low fat diet - will check lipid panel at f/u in 3 months  LIPID PANEL   4. 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 Gastroesophageal reflux disease without esophagitis K21.9 530.81 Stable  Continue Omeprazole   6. Vitamin D deficiency E55.9 268.9 Continue Vit D supplementation and will check Vit D at f/u in 3 months  VITAMIN D, 25 HYDROXY    7. Obesity (BMI 30-39.9) E66.9 278.00 Discussed the patient's above normal BMI with her.  I have recommended the following interventions: dietary management education, guidance, and counseling  encourage exercise  lifestyle education regarding diet .  The BMI follow up plan is as follows: BMI is out of normal parameters and plan is as follows: I have counseled this patient on diet and exercise regimens   8.  Encounter for immunization Z23 V03.89 Flu shot given todayI  NFLUENZA VIRUS VAC QUAD,SPLIT,PRESV FREE SYRINGE 3/> YRS IM   9. Need for hepatitis C screening test Z11.59 V73.89 Hep C AB will be done at f/u in 3 months   10. Asthma, unspecified asthma severity, uncomplicated S06.301 601.09 Stable  albuterol (PROVENTIL HFA) 90 mcg/actuation inhaler   11. Encounter for screening mammogram for breast cancer Z12.31 V76.12 MAM MAMMO BI SCREENING DIGTL       Orders Placed This Encounter   ??? MAM MAMMO BI SCREENING DIGTL     Standing Status: Future      Number of Occurrences:       Standing Expiration Date: 11/24/2015     Order Specific Question:  Reason for Exam     Answer:  Screening mammogram   ??? Influenza virus vaccine (QUADRIVALENT PRES FREE SYRINGE) IM 3 years and older   ??? CBC WITH AUTOMATED DIFF     Standing Status: Future      Number of Occurrences:       Standing Expiration Date: 04/22/2015   ??? HEMOGLOBIN A1C     Standing Status: Future      Number of Occurrences:       Standing Expiration Date: 02/21/2015   ??? LIPID PANEL     Standing Status: Future      Number of Occurrences:       Standing Expiration Date: 02/27/5572   ??? METABOLIC PANEL, COMPREHENSIVE     Standing Status: Future      Number of Occurrences:       Standing Expiration Date: 04/22/2015   ??? TSH, 3RD GENERATION     Standing Status: Future      Number of Occurrences:       Standing Expiration Date: 02/21/2015   ??? T4, FREE     Standing Status: Future      Number of Occurrences:       Standing Expiration Date: 04/22/2015   ??? VITAMIN D, 25 HYDROXY      Standing Status: Future      Number of Occurrences:       Standing Expiration Date: 02/21/2015   ??? HEPATITIS C AB     Standing Status: Future      Number of Occurrences:       Standing Expiration Date: 10/26/2015   ??? ranitidine (ZANTAC) 150 mg tablet     Sig: Take 150 mg by mouth two (2) times a day.   ??? albuterol (PROVENTIL HFA) 90 mcg/actuation inhaler     Sig: Take 2 Puffs by inhalation every six (6) hours as needed for Wheezing.     Dispense:  1 Inhaler     Refill:  5       Additional Notes: Discussed today's diagnosis, treatment plans. Discussed medication indications and side effects.    Weight Management:Counseled  After Visit Summary: Discussed provided printed patient instructions. Answered questions accordingly.  Follow-up Disposition: In 3 months with labs 1 week prior          Osie Cheeks, DO, MPH  Internal Medicine

## 2014-10-26 NOTE — Addendum Note (Signed)
Addended by: Selena Lesser on: 10/26/2014 10:34 AM      Modules accepted: Level of Service

## 2014-11-07 ENCOUNTER — Ambulatory Visit
Admit: 2014-11-07 | Discharge: 2014-11-07 | Payer: PRIVATE HEALTH INSURANCE | Attending: Internal Medicine | Primary: Internal Medicine

## 2014-11-07 DIAGNOSIS — M25562 Pain in left knee: Secondary | ICD-10-CM

## 2014-11-07 MED ORDER — TRAMADOL 50 MG TAB
50 mg | ORAL_TABLET | Freq: Four times a day (QID) | ORAL | Status: DC | PRN
Start: 2014-11-07 — End: 2016-07-03

## 2014-11-07 NOTE — Patient Instructions (Addendum)
Knee Pain: After Your Visit  Your Care Instructions     Overuse is often the cause of knee pain. Other causes are climbing stairs, kneeling, or other activities that use the knee. Everyday wear and tear, especially as you get older, also can cause knee pain.  Rest, along with home treatment, often relieves pain and allows your knee to heal. If you have a serious knee injury, you may need tests and treatment.  Follow-up care is a key part of your treatment and safety. Be sure to make and go to all appointments, and call your doctor if you are having problems. It???s also a good idea to know your test results and keep a list of the medicines you take.  How can you care for yourself at home?  ?? Take pain medicines exactly as directed.  ?? If the doctor gave you a prescription medicine for pain, take it as prescribed.  ?? If you are not taking a prescription pain medicine, ask your doctor if you can take an over-the-counter medicine.  ?? Do not take two or more pain medicines at the same time unless the doctor told you to. Many pain medicines contain acetaminophen, which is Tylenol. Too much acetaminophen (Tylenol) can be harmful.  ?? Rest and protect your knee. Take a break from any activity that may cause pain.  ?? Put ice or a cold pack on your knee for 10 to 20 minutes at a time. Put a thin cloth between the ice and your skin.  ?? Prop up a sore knee on a pillow when you ice it or anytime you sit or lie down for the next 3 days. Try to keep it above the level of your heart. This will help reduce swelling.  ?? If your doctor recommends an elastic bandage, sleeve, or other type of support for your knee, wear it as directed.  ?? If your knee is not swollen, you can put moist heat, a heating pad, or a warm cloth on your knee.  ?? After several days of rest, you can begin gentle exercise of your knee.  ?? Reach and stay at a healthy weight. Extra weight can strain the joints,  especially the knees and hips, and make the pain worse. Losing even a few pounds may help.  When should you call for help?  Call 911 anytime you think you may need emergency care. For example, call if:  ?? You have sudden chest pain and shortness of breath, or you cough up blood.  Call your doctor now or seek immediate medical care if:  ?? You have severe or increasing pain.  ?? Your leg or foot turns cold or changes color.  ?? You cannot stand or put weight on your knee.  ?? Your knee looks twisted or bent out of shape.  ?? You cannot move your knee.  ?? You have signs of infection, such as:  ?? Increased pain, swelling, warmth, or redness.  ?? Red streaks leading from the sore area.  ?? Pus draining from a place on your knee.  ?? A fever.  ?? You have signs of a blood clot in your leg, such as:  ?? Pain in your calf, back of the knee, thigh, or groin.  ?? Redness and swelling in your leg or groin.  Watch closely for changes in your health, and be sure to contact your doctor if:  ?? Your knee feels numb or tingly.  ?? You do not get better as   expected.  ?? You have any new symptoms, such as swelling.  ?? You have bruises from a knee injury that last longer than 2 weeks.   Where can you learn more?   Go to http://www.healthwise.net/[facility]  Enter K195 in the search box to learn more about "Knee Pain: After Your Visit."   ?? 2006-2015 Healthwise, Incorporated. Care instructions adapted under license by [facility] (which disclaims liability or warranty for this information). This care instruction is for use with your licensed healthcare professional. If you have questions about a medical condition or this instruction, always ask your healthcare professional. Healthwise, Incorporated disclaims any warranty or liability for your use of this information.  Content Version: 10.5.422740; Current as of: October 20, 2013              Knee Pain: After Your Visit  Your Care Instructions      Overuse is often the cause of knee pain. Other causes are climbing stairs, kneeling, or other activities that use the knee. Everyday wear and tear, especially as you get older, also can cause knee pain.  Rest, along with home treatment, often relieves pain and allows your knee to heal. If you have a serious knee injury, you may need tests and treatment.  Follow-up care is a key part of your treatment and safety. Be sure to make and go to all appointments, and call your doctor if you are having problems. It???s also a good idea to know your test results and keep a list of the medicines you take.  How can you care for yourself at home?  ?? Take pain medicines exactly as directed.  ?? If the doctor gave you a prescription medicine for pain, take it as prescribed.  ?? If you are not taking a prescription pain medicine, ask your doctor if you can take an over-the-counter medicine.  ?? Do not take two or more pain medicines at the same time unless the doctor told you to. Many pain medicines contain acetaminophen, which is Tylenol. Too much acetaminophen (Tylenol) can be harmful.  ?? Rest and protect your knee. Take a break from any activity that may cause pain.  ?? Put ice or a cold pack on your knee for 10 to 20 minutes at a time. Put a thin cloth between the ice and your skin.  ?? Prop up a sore knee on a pillow when you ice it or anytime you sit or lie down for the next 3 days. Try to keep it above the level of your heart. This will help reduce swelling.  ?? If your doctor recommends an elastic bandage, sleeve, or other type of support for your knee, wear it as directed.  ?? If your knee is not swollen, you can put moist heat, a heating pad, or a warm cloth on your knee.  ?? After several days of rest, you can begin gentle exercise of your knee.  ?? Reach and stay at a healthy weight. Extra weight can strain the joints, especially the knees and hips, and make the pain worse. Losing even a few pounds may help.   When should you call for help?  Call 911 anytime you think you may need emergency care. For example, call if:  ?? You have sudden chest pain and shortness of breath, or you cough up blood.  Call your doctor now or seek immediate medical care if:  ?? You have severe or increasing pain.  ?? Your leg or foot turns cold or   changes color.  ?? You cannot stand or put weight on your knee.  ?? Your knee looks twisted or bent out of shape.  ?? You cannot move your knee.  ?? You have signs of infection, such as:  ?? Increased pain, swelling, warmth, or redness.  ?? Red streaks leading from the sore area.  ?? Pus draining from a place on your knee.  ?? A fever.  ?? You have signs of a blood clot in your leg, such as:  ?? Pain in your calf, back of the knee, thigh, or groin.  ?? Redness and swelling in your leg or groin.  Watch closely for changes in your health, and be sure to contact your doctor if:  ?? Your knee feels numb or tingly.  ?? You do not get better as expected.  ?? You have any new symptoms, such as swelling.  ?? You have bruises from a knee injury that last longer than 2 weeks.   Where can you learn more?   Go to http://www.healthwise.net/[facility]  Enter K195 in the search box to learn more about "Knee Pain: After Your Visit."   ?? 2006-2015 Healthwise, Incorporated. Care instructions adapted under license by [facility] (which disclaims liability or warranty for this information). This care instruction is for use with your licensed healthcare professional. If you have questions about a medical condition or this instruction, always ask your healthcare professional. Healthwise, Incorporated disclaims any warranty or liability for your use of this information.  Content Version: 10.5.422740; Current as of: October 20, 2013

## 2014-11-07 NOTE — Progress Notes (Signed)
Patient is in the office today for a complaint of left knee pain 5/10. Patient stated the pain is located behind her knee which started a week ago.    Do you have an Advance Directive? No  Would you like more information about Advance Directive? Previously given       1. Have you been to the ER, urgent care clinic since your last visit?  Hospitalized since your last visit? No    2. Have you seen or consulted any other health care providers outside of the [facility] since your last visit?  Include any pap smears or colon screening. Yes patient had a well woman exam with Dr. Khadijah Martinique in August.

## 2014-11-07 NOTE — Progress Notes (Signed)
Chief Complaint   Patient presents with   ??? Knee Pain     left        HPI:  Patient is a 63 year old African American female with medical problems listed below presents today for an acute visit with left knee pain. Left knee pain started about a week ago and is worse on the posterior aspect of the left knee. Pain is worse while ambulating especially at work as she is employed as a Aeronautical engineer. Associated is swelling of left lower extremity, though she denies recent fall or trauma, fever, chills, or other complaints. She has been taking Tylenol with mild relief and thus came in today for evaluation.       Past Medical History   Diagnosis Date   ??? Iron deficiency anemia 5/63/8756   ??? Lichen planus 4/33/2951   ??? Hypertension    ??? Hypercholesterolemia    ??? Thyroid disease      hypothyroidism   ??? Asthma    ??? Right sided sciatica    ??? Colon polyp    ??? Depression 12/18/2011   ??? OA (osteoarthritis of spine) 02/15/2009   ??? DDD (degenerative disc disease) 02/15/2009   ??? OA (osteoarthritis of spine) 02/15/2009   ??? Sleep apnea 02/15/2009     uses cpap machine   ??? History of echocardiogram 03/20/2004     EF 70%.  Borderline DDfx.  No significant valvular pathology.   ??? Thallium stress test  03/20/2004     Partially transient, mod basal & mid anterior defect most c/w artifact; mild anterior ischemia less likely.  Neg EKG on pharm stress test.   ??? Lower extremity venous duplex 10/23/2009     Left leg:  No DVT.     ??? Anemia    ??? Shortness of breath      Possible asthma, HCVD; less likely CAD (Noted 03/15/09)   ??? Essential hypertension, benign    ??? Other and unspecified hyperlipidemia    ??? Pre-operative cardiovascular examination      For spine surgery   ??? Obesity, unspecified    ??? Meralgia paraesthetica 10/22/2013     Allergies   Allergen Reactions   ??? Asa-Acetaminophen-Caff-Potass Other (comments)     Bleeding in stomach   ??? Benadryl [Diphenhydramine Hcl] Other (comments)     Muscle jerking    ??? Other Plant, Animal, Environmental Not Reported This Time     Grass, Dust and Mold   ??? Pollen Extracts Not Reported This Time     Current Outpatient Prescriptions   Medication Sig Dispense Refill   ??? SYNTHROID 150 mcg tablet TAKE 1 TABLET BY MOUTH BEFORE BREAKFAST 30 Tab 0   ??? ranitidine (ZANTAC) 150 mg tablet Take 150 mg by mouth two (2) times a day.     ??? albuterol (PROVENTIL HFA) 90 mcg/actuation inhaler Take 2 Puffs by inhalation every six (6) hours as needed for Wheezing. 1 Inhaler 5   ??? gabapentin (NEURONTIN) 300 mg capsule Take 2 capsules at bedtime and 1 capsule in the morning. 90 Cap 3   ??? rosuvastatin (CRESTOR) 40 mg tablet Take 1 Tab by mouth daily. 30 Tab 0   ??? valsartan-hydrochlorothiazide (DIOVAN-HCT) 160-25 mg per tablet Take 1 Tab by mouth daily. 30 Tab 3   ??? omeprazole (PRILOSEC) 40 mg capsule take 1 capsule by mouth once daily BEFORE BREAKFAST 30 Cap 5   ??? montelukast (SINGULAIR) 10 mg tablet Take 1 Tab by mouth nightly as needed.  Indications: asthma 90 Tab 3   ??? budesonide-formoterol (SYMBICORT) 160-4.5 mcg/actuation HFA inhaler Take 2 Puffs by inhalation two (2) times a day. 1 Inhaler 5   ??? IRON DEXTRAN COMPLEX (IRON DEXTRAN IV) by IntraVENous route.     ??? Cholecalciferol, Vitamin D3, (VITAMIN D-3) 5,000 unit Tab Take  by mouth daily.            ROS:  Pertinent as in HPI      Physical Exam:  BP 144/76 mmHg   Pulse 83   Temp(Src) 98.2 ??F (36.8 ??C) (Oral)   Resp 18   Ht 5\' 2"  (1.575 m)   Wt 202 lb (91.627 kg)   BMI 36.94 kg/m2   SpO2 97%  General: Obese black female, a & o x 3, afebrile, interacting appropriately, in no acute distress  Skin: warm and dry, no rashes , no bruises  Respiratory: symmetrical chest expansion, lung sounds clear bilaterally, good air entry, good respiratory effort, no wheezes or crackles  Cardiovascular: normal S1S2, regular rate and rhythm, no murmurs, pulses palpable, no thrill, no carotid or abdominal bruits, edema noted on left lower extremity, no JVD   Abdomen: non-distended, normoactive bowel sounds x 4 quadrants, soft, non-tender to palpation  Musculoskeletal: Tenderness noted on left knee with a small cyst also noted on posterior aspect of left knee      Assessment/Plan:    ICD-10-CM ICD-9-CM    1. Left knee pain M25.562 719.46 May be Baker's cyst as a small cyst noted on posterior aspect of left knee. Tramadol started for pain as she has allergies to NSAIDs and will obtain imaging of left knee  traMADol (ULTRAM) 50 mg tablet      XR KNEE LT MIN 4 V   2. Swelling of left lower extremity M79.89 729.81 Advised on elevation and low salt diet and will obtain duplex venous ultrasound to r/o DVT  DUPLEX LOWER EXT VENOUS LEFT   3. HTN (hypertension), benign I10 401.1 Controlled  Continue current meds and she was counseled on low salt diet       Orders Placed This Encounter   ??? XR KNEE LT MIN 4 V     Standing Status: Future      Number of Occurrences:       Standing Expiration Date: 12/09/2015     Order Specific Question:  Reason for Exam     Answer:  Pt with left knee pain     Order Specific Question:  Is Patient Allergic to Contrast Dye?     Answer:  Unknown   ??? DUPLEX LOWER EXT VENOUS LEFT     Standing Status: Future      Number of Occurrences:       Standing Expiration Date: 12/09/2015     Order Specific Question:  Reason for Exam     Answer:  Pt with left lower extremity swelling  - please evaluate for DVT   ??? traMADol (ULTRAM) 50 mg tablet     Sig: Take 1 Tab by mouth every six (6) hours as needed for Pain. Max Daily Amount: 200 mg.     Dispense:  40 Tab     Refill:  0         Additional Notes: Discussed today's diagnosis, treatment plans. Discussed medication indications and side effects.    Weight Management:Counseled  After Visit Summary: Discussed provided printed patient instructions. Answered questions accordingly.  Follow-up Disposition: In 1 week to review left knee imaging and venous ultrasound results.  Osie Cheeks, DO, MPH   Internal Medicine      Total time spent for this visit was 25 minutes with greater than 50% of the time spent involved in counseling on treatment modalities and diagnostic imaging for left knee pain and left lower extremity swelling

## 2014-11-09 ENCOUNTER — Encounter

## 2014-11-09 ENCOUNTER — Inpatient Hospital Stay: Admit: 2014-11-09 | Primary: Internal Medicine

## 2014-11-09 DIAGNOSIS — M25462 Effusion, left knee: Secondary | ICD-10-CM

## 2014-11-09 MED ORDER — VALSARTAN-HYDROCHLOROTHIAZIDE 160 MG-25 MG TAB
160-25 mg | ORAL_TABLET | Freq: Every day | ORAL | Status: DC
Start: 2014-11-09 — End: 2014-12-24

## 2014-11-09 NOTE — Telephone Encounter (Signed)
Last OV 10/24/14  Next OV 01/21/15

## 2014-11-13 ENCOUNTER — Inpatient Hospital Stay: Admit: 2014-11-13 | Payer: BLUE CROSS/BLUE SHIELD | Attending: Internal Medicine | Primary: Internal Medicine

## 2014-11-13 DIAGNOSIS — M79609 Pain in unspecified limb: Secondary | ICD-10-CM

## 2014-11-14 ENCOUNTER — Ambulatory Visit
Admit: 2014-11-14 | Discharge: 2014-11-14 | Payer: PRIVATE HEALTH INSURANCE | Attending: Internal Medicine | Primary: Internal Medicine

## 2014-11-14 DIAGNOSIS — M25562 Pain in left knee: Secondary | ICD-10-CM

## 2014-11-14 NOTE — Procedures (Signed)
[facility]  *** FINAL REPORT ***    Name: Elaine [name], Elaine [name]  MRN: [id number]    Outpatient  DOB: [birth year]  HIS Order #: [id number]  [place] Visit #: [id number]  Date: 13 Nov 2014    TYPE OF TEST: Peripheral Venous Testing    REASON FOR TEST  Pain in limb, Limb swelling    Left Leg:-  Deep venous thrombosis:           No  Superficial venous thrombosis:    No  Deep venous insufficiency:        Not examined  Superficial venous insufficiency: Not examined      INTERPRETATION/FINDINGS  Left leg :  1. Deep veins visualized include the common femoral, femoral,  popliteal, posterior tibial and peroneal veins.  2. No evidence of deep venous thrombosis detected in the veins  visualized.  3. No evidence of deep vein thrombosis in the contralateral common  femoral vein.  4. Superficial veins visualized include the great saphenous vein.  5. No evidence of superficial thrombosis detected.  6. Several avascular collections noted.  Two of them are in the  popliteal fossa but the third is intramuscular and in the posterior  aspect of the proximal calf in the area of maximum discomfort.  7. There is normal multiphasic flow in the posterior tibial artery.    ADDITIONAL COMMENTS    I have personally reviewed the data relevant to the interpretation of  this  study.    TECHNOLOGIST: Ilean Skill, RCIS, RVS  Signed: 11/13/2014 06:40 PM    PHYSICIAN: Mali Kamyrah Feeser, D.O.  Signed: 11/14/2014 07:31 AM

## 2014-11-14 NOTE — Progress Notes (Signed)
Patient in office today for 1 week follow up. Patient is not taking the Ultram because she experienced memory loss after taking it for 2 days. Patient complain of chronic pain to left knee 4/10.    Do you have an Advance Directive? No  Would you like more information about Advance Directive? Previously given       1. Have you been to the ER, urgent care clinic since your last visit?  Hospitalized since your last visit? No    2. Have you seen or consulted any other health care providers outside of the [place] since your last visit?  Include any pap smears or colon screening.  No

## 2014-11-14 NOTE — Procedures (Signed)
[facility]  *** FINAL REPORT ***    Name: Elaine [name], Elaine [name]  MRN: [id number]    Outpatient  DOB: 05-28-1951  HIS Order #: [id number]  [place] Visit #: [id number]  Date: 13 Nov 2014    TYPE OF TEST: Peripheral Venous Testing    REASON FOR TEST  Pain in limb, Limb swelling    Left Leg:-  Deep venous thrombosis:           No  Superficial venous thrombosis:    No  Deep venous insufficiency:        Not examined  Superficial venous insufficiency: Not examined      INTERPRETATION/FINDINGS  Left leg :  1. Deep veins visualized include the common femoral, femoral,  popliteal, posterior tibial and peroneal veins.  2. No evidence of deep venous thrombosis detected in the veins  visualized.  3. No evidence of deep vein thrombosis in the contralateral common  femoral vein.  4. Superficial veins visualized include the great saphenous vein.  5. No evidence of superficial thrombosis detected.  6. Several avascular collections noted.  Two of them are in the  popliteal fossa but the third is intramuscular and in the posterior  aspect of the proximal calf in the area of maximum discomfort.  7. There is normal multiphasic flow in the posterior tibial artery.    ADDITIONAL COMMENTS    I have personally reviewed the data relevant to the interpretation of  this  study.    TECHNOLOGIST: Ilean Skill, RCIS, RVS  Signed: 11/13/2014 06:40 PM    PHYSICIAN: [place]i Jaevin Medearis, D.O.  Signed: 11/14/2014 07:31 AM

## 2014-11-14 NOTE — Patient Instructions (Signed)
Knee Arthritis: After Your Visit  Your Care Instructions  Knee arthritis is a breakdown of the cartilage that cushions your knee joint. When the cartilage wears down, your bones rub against each other. This causes pain and stiffness. Knee arthritis tends to get worse with time.  Treatment for knee arthritis involves reducing pain, making the leg muscles stronger, and staying at a healthy body weight. The treatment usually does not improve the health of the cartilage, but it can reduce pain and improve how well your knee works.  You can take simple measures to protect your knee joints, ease your pain, and help you stay active.  Follow-up care is a key part of your treatment and safety. Be sure to make and go to all appointments, and call your doctor if you are having problems. It's also a good idea to know your test results and keep a list of the medicines you take.  How can you care for yourself at home?  ?? Know that knee arthritis will cause more pain on some days than on others.  ?? Stay at a healthy weight. Lose weight if you are overweight. When you stand up, the pressure on your knees from every pound of body weight is multiplied four times. So if you lose 10 pounds, you will reduce the pressure on your knees by 40 pounds.  ?? Talk to your doctor or physical therapist about exercises that will help ease joint pain.  ?? Stretch to help prevent stiffness and to prevent injury before you exercise. You may enjoy gentle forms of yoga to help keep your knee joints and muscles flexible.  ?? Walk instead of jog.  ?? Ride a bike. This makes your thigh muscles stronger and takes pressure off your knee.  ?? Wear well-fitting and comfortable shoes.  ?? Exercise in chest-deep water. This can help you exercise longer with less pain.  ?? Avoid exercises that include squatting or kneeling. They can put a lot of strain on your knees.  ?? Talk to your doctor to make sure that the exercise you do is not making the arthritis worse.   ?? Do not sit for long periods of time. Try to walk once in a while to keep your knee from getting stiff.  ?? Ask your doctor or physical therapist whether shoe inserts may reduce your arthritis pain.  ?? If you can afford it, get new athletic shoes at least every year. This can help reduce the strain on your knees.  ?? Use a device to help you do everyday activities.  ?? A cane or walking stick can help you keep your balance when you walk. Hold the cane or walking stick in the hand opposite the painful knee.  ?? If you feel like you may fall when you walk, try using crutches or a front-wheeled walker. These can prevent falls that could cause more damage to your knee.  ?? A knee brace may help keep your knee stable and prevent pain.  ?? You also can use other things to make life easier, such as a higher toilet seat and handrails in the bathtub or shower.  ?? Take pain medicines exactly as directed.  ?? Do not wait until you are in severe pain. You will get better results if you take it sooner.  ?? If you are not taking a prescription pain medicine, take an over-the-counter medicine such as acetaminophen (Tylenol), ibuprofen (Advil, Motrin), or naproxen (Aleve). Read and follow all instructions on the label.  ??   Do not take two or more pain medicines at the same time unless the doctor told you to. Many pain medicines have acetaminophen, which is Tylenol. Too much acetaminophen (Tylenol) can be harmful.  ?? Tell your doctor if you take a blood thinner, have diabetes, or have allergies to shellfish.  ?? Ask your doctor if you might benefit from a shot of steroid medicine into your knee. This may provide pain relief for several months.  ?? Many people take the supplements glucosamine and chondroitin for osteoarthritis. Some people feel they help, but the medical research does not show that they work. Talk to your doctor before you take these supplements.  When should you call for help?   Call your doctor now or seek immediate medical care if:  ?? You have sudden swelling, warmth, or pain in your knee.  ?? You have knee pain and a fever or rash.  ?? You have such bad pain that you cannot use your knee.  Watch closely for changes in your health, and be sure to contact your doctor if you have any problems.   Where can you learn more?   Go to http://www.healthwise.net/[facility]  Enter W187 in the search box to learn more about "Knee Arthritis: After Your Visit."   ?? 2006-2015 Healthwise, Inc[employer]. Care instructions adapted under license by [employer] (which disclaims liability or warranty for this information). This care instruction is for use with your licensed healthcare professional. If you have questions about a medical condition or this instruction, always ask your healthcare professional. Healthwise, Incorporated disclaims any warranty or liability for your use of this information.  Content Version: 10.5.422740; Current as of: August 15, 2013

## 2014-11-14 NOTE — Progress Notes (Signed)
Chief Complaint   Patient presents with   ??? Knee Pain     1 week follow up to reveiw knee imaging and ultrasound       HPI:  Patient is a 63 year old African American female with medical problems listed below presents today for follow up for left knee pain. Imaging of the left knee was ordered at her last office visit one week ago and revealed moderate joint effusion and moderate osteoarthritis of the left knee. Left knee pain and swelling has improved and she had taken Tramadol for two days and later stopped because of adverse effects causing some memory loss, and instead has been taking Tylenol as needed for mild pain. Duplex venous ultrasound of left lower extremity also ordered at her last visit was reviewed with pt and was negative for DVT.      Past Medical History   Diagnosis Date   ??? Iron deficiency anemia 02/27/5572   ??? Lichen planus 01/26/2541   ??? Hypertension    ??? Hypercholesterolemia    ??? Thyroid disease      hypothyroidism   ??? Asthma    ??? Right sided sciatica    ??? Colon polyp    ??? Depression 12/18/2011   ??? OA (osteoarthritis of spine) 02/15/2009   ??? DDD (degenerative disc disease) 02/15/2009   ??? OA (osteoarthritis of spine) 02/15/2009   ??? Sleep apnea 02/15/2009     uses cpap machine   ??? History of echocardiogram 03/20/2004     EF 70%.  Borderline DDfx.  No significant valvular pathology.   ??? Thallium stress test  03/20/2004     Partially transient, mod basal & mid anterior defect most c/w artifact; mild anterior ischemia less likely.  Neg EKG on pharm stress test.   ??? Lower extremity venous duplex 10/23/2009     Left leg:  No DVT.     ??? Anemia    ??? Shortness of breath      Possible asthma, HCVD; less likely CAD (Noted 03/15/09)   ??? Essential hypertension, benign    ??? Other and unspecified hyperlipidemia    ??? Pre-operative cardiovascular examination      For spine surgery   ??? Obesity, unspecified    ??? Meralgia paraesthetica 10/22/2013     Allergies   Allergen Reactions    ??? Asa-Acetaminophen-Caff-Potass Other (comments)     Bleeding in stomach   ??? Benadryl [Diphenhydramine Hcl] Other (comments)     Muscle jerking   ??? Other Plant, Animal, Environmental Not Reported This Time     Grass, Dust and Mold   ??? Pollen Extracts Not Reported This Time     Current Outpatient Prescriptions   Medication Sig Dispense Refill   ??? valsartan-hydrochlorothiazide (DIOVAN-HCT) 160-25 mg per tablet Take 1 Tab by mouth daily. 30 Tab 0   ??? SYNTHROID 150 mcg tablet TAKE 1 TABLET BY MOUTH BEFORE BREAKFAST 30 Tab 0   ??? ranitidine (ZANTAC) 150 mg tablet Take 150 mg by mouth two (2) times a day.     ??? albuterol (PROVENTIL HFA) 90 mcg/actuation inhaler Take 2 Puffs by inhalation every six (6) hours as needed for Wheezing. 1 Inhaler 5   ??? gabapentin (NEURONTIN) 300 mg capsule Take 2 capsules at bedtime and 1 capsule in the morning. 90 Cap 3   ??? rosuvastatin (CRESTOR) 40 mg tablet Take 1 Tab by mouth daily. 30 Tab 0   ??? omeprazole (PRILOSEC) 40 mg capsule take 1 capsule by mouth once  daily BEFORE BREAKFAST 30 Cap 5   ??? montelukast (SINGULAIR) 10 mg tablet Take 1 Tab by mouth nightly as needed. Indications: asthma 90 Tab 3   ??? budesonide-formoterol (SYMBICORT) 160-4.5 mcg/actuation HFA inhaler Take 2 Puffs by inhalation two (2) times a day. 1 Inhaler 5   ??? IRON DEXTRAN COMPLEX (IRON DEXTRAN IV) by IntraVENous route.     ??? Cholecalciferol, Vitamin D3, (VITAMIN D-3) 5,000 unit Tab Take  by mouth daily.     ??? traMADol (ULTRAM) 50 mg tablet Take 1 Tab by mouth every six (6) hours as needed for Pain. Max Daily Amount: 200 mg. 40 Tab 0          ROS:  Pertinent as in HPI      Physical Exam:  BP 152/86 mmHg   Pulse 98   Temp(Src) 98.4 ??F (36.9 ??C) (Oral)   Resp 20   Ht 5\' 2"  (1.575 m)   Wt 200 lb (90.719 kg)   BMI 36.57 kg/m2   SpO2 97%  General: Obese black female, a & o x 3, afebrile, interacting appropriately, in no acute distress  Skin: warm and dry, no rashes , no bruises   Respiratory: symmetrical chest expansion, lung sounds clear bilaterally, good air entry, good respiratory effort, no wheezes or crackles  Cardiovascular: normal S1S2, regular rate and rhythm, no murmurs, pulses palpable, no thrill, no carotid or abdominal bruits, edema noted on left lower extremity, no JVD  Abdomen: non-distended, normoactive bowel sounds x 4 quadrants, soft, non-tender to palpation  Musculoskeletal: No left knee tenderness noted and improved edema and erythema noted on left lower extremity      Assessment/Plan:    ICD-10-CM ICD-9-CM    1. Left knee pain M25.562 719.46 REFERRAL TO ORTHOPEDICS   2. Swelling of left lower extremity M79.89 729.81 Improved   3. Osteoarthritis of left knee, unspecified osteoarthritis type M17.9 715.96 REFERRAL TO ORTHOPEDICS       Orders Placed This Encounter   ??? REFERRAL TO ORTHOPEDICS     Referral Priority:  Routine     Referral Type:  Consultation     Referral Reason:  Specialty Services Required     Referred to Provider:  Delrae Sawyers, MD         Additional Notes: Discussed today's diagnosis, treatment plans. Discussed medication indications and side effects.    Weight Management:Counseled  After Visit Summary: Discussed provided printed patient instructions. Answered questions accordingly.  Follow-up Disposition: As previously scheduled          Osie Cheeks, DO, MPH  Internal Medicine

## 2014-11-26 ENCOUNTER — Encounter

## 2014-11-26 MED ORDER — LEVOTHYROXINE 150 MCG TAB
150 mcg | ORAL_TABLET | ORAL | Status: DC
Start: 2014-11-26 — End: 2015-01-22

## 2014-11-26 NOTE — Telephone Encounter (Signed)
Unable to leave message for their was no vm noted.

## 2014-11-26 NOTE — Telephone Encounter (Signed)
Synthroid refilled and sent to pharmacy. Please notify pt.

## 2014-12-09 ENCOUNTER — Encounter

## 2014-12-10 MED ORDER — LEVOTHYROXINE 150 MCG TAB
150 mcg | ORAL_TABLET | ORAL | Status: DC
Start: 2014-12-10 — End: 2015-01-22

## 2014-12-10 NOTE — Telephone Encounter (Signed)
Unable to leave message for patient for no vm noted.

## 2014-12-10 NOTE — Telephone Encounter (Signed)
Synthroid refilled and sent to pharmacy. Please notify pt.

## 2014-12-24 ENCOUNTER — Encounter

## 2014-12-24 NOTE — Telephone Encounter (Signed)
Last OV 11/14/14  Next OV 01/21/15   Last Filled Crestor 09/24/14, Diovan 11/09/14, Singular 04/12/14

## 2014-12-25 MED ORDER — MONTELUKAST 10 MG TAB
10 mg | ORAL_TABLET | Freq: Every evening | ORAL | Status: DC | PRN
Start: 2014-12-25 — End: 2015-07-08

## 2014-12-25 MED ORDER — VALSARTAN-HYDROCHLOROTHIAZIDE 160 MG-25 MG TAB
160-25 mg | ORAL_TABLET | Freq: Every day | ORAL | Status: DC
Start: 2014-12-25 — End: 2015-05-07

## 2014-12-25 MED ORDER — ROSUVASTATIN 40 MG TAB
40 mg | ORAL_TABLET | Freq: Every day | ORAL | Status: DC
Start: 2014-12-25 — End: 2015-04-25

## 2014-12-25 NOTE — Telephone Encounter (Signed)
Crestor, Diovan-HCT, and Singulair refilled and sent to pharmacy. Please notify pt.

## 2014-12-25 NOTE — Telephone Encounter (Signed)
Tried calling patient to make aware of medication refills but no vm noted.

## 2015-01-07 IMAGING — CR DG CHEST 2V
2 series · 2 of 2 positions shown · non-contrast
Comparison: None.

CLINICAL DATA: Severe heartburn and abdominal pain.

EXAM:
CHEST  2 VIEW

[w chest pa]
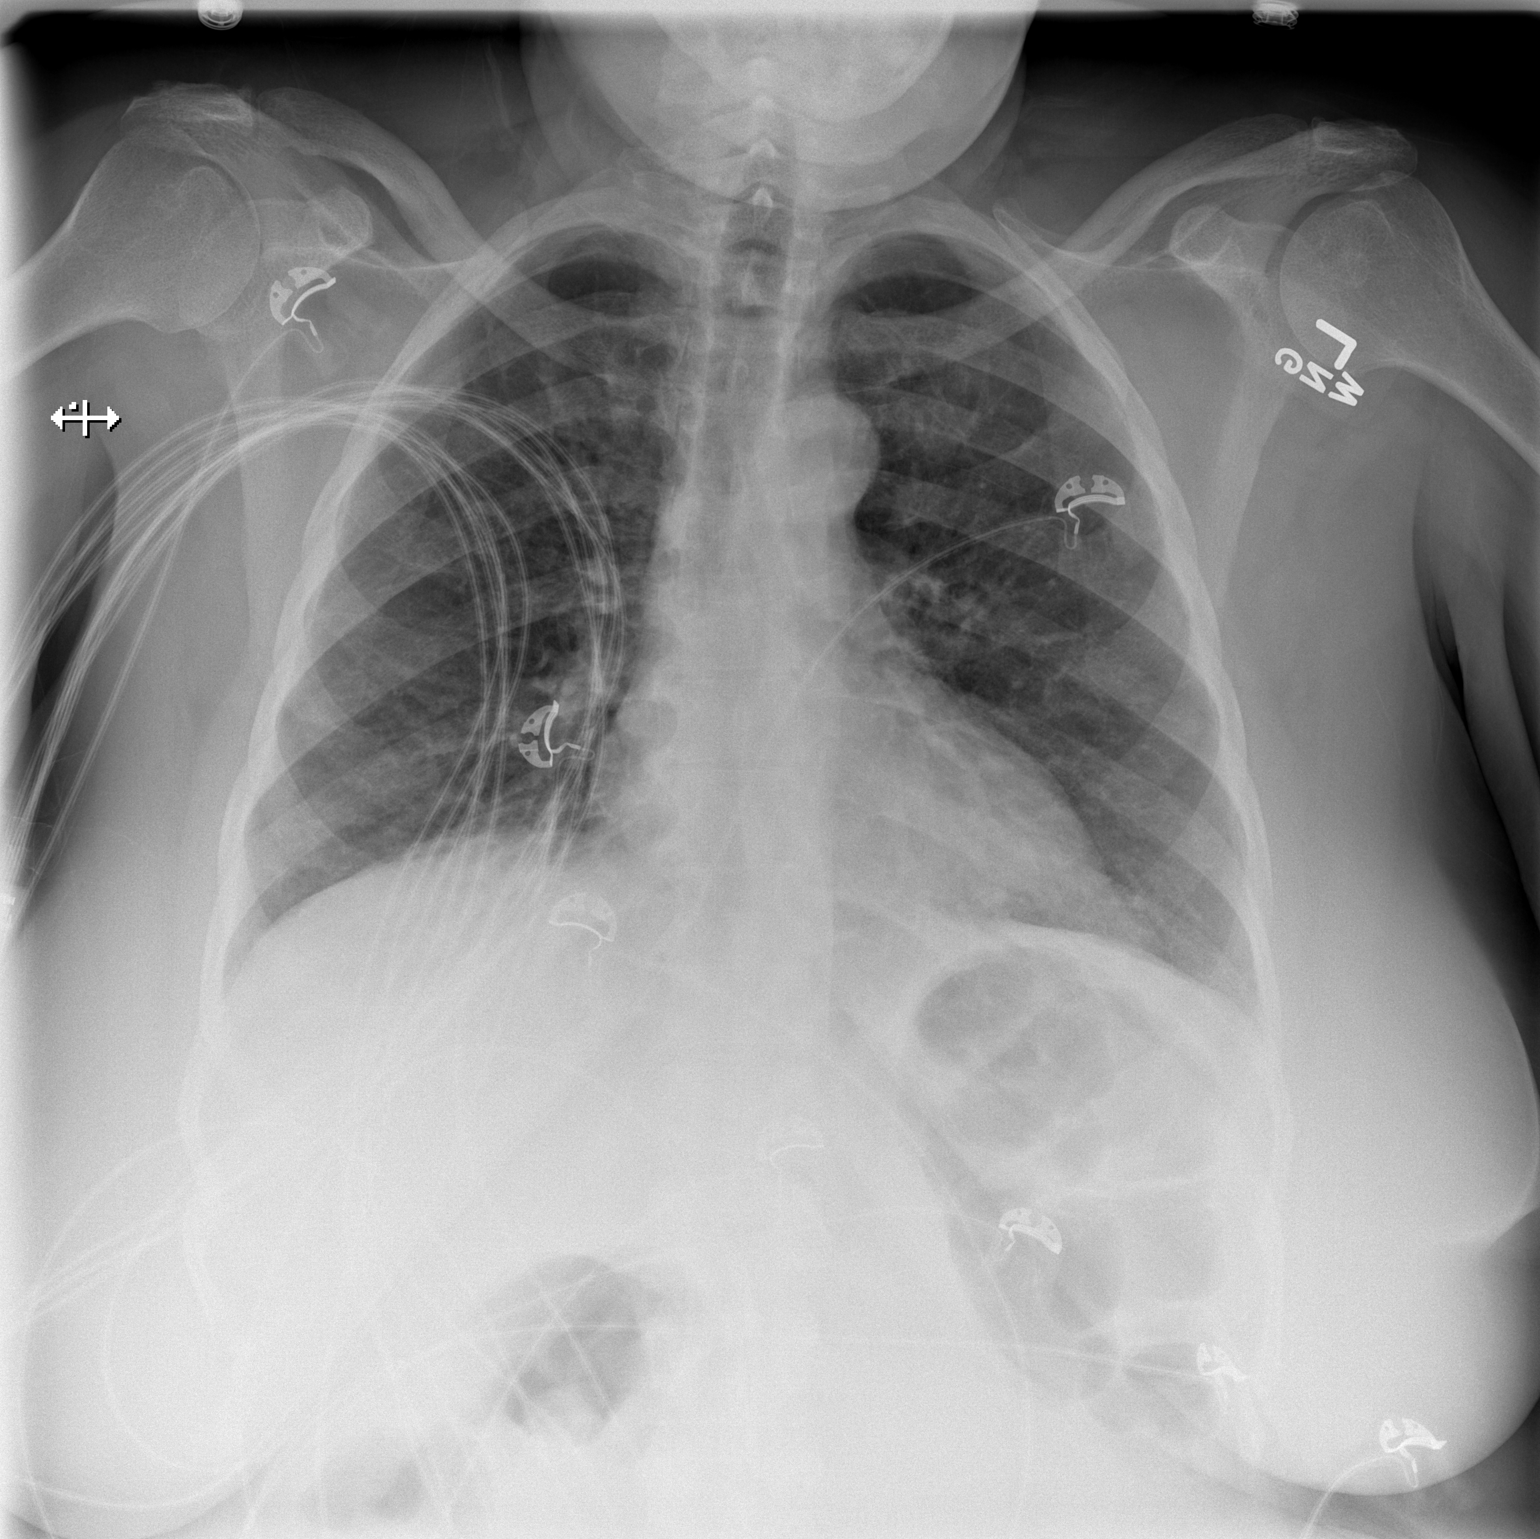

[w chest lat]
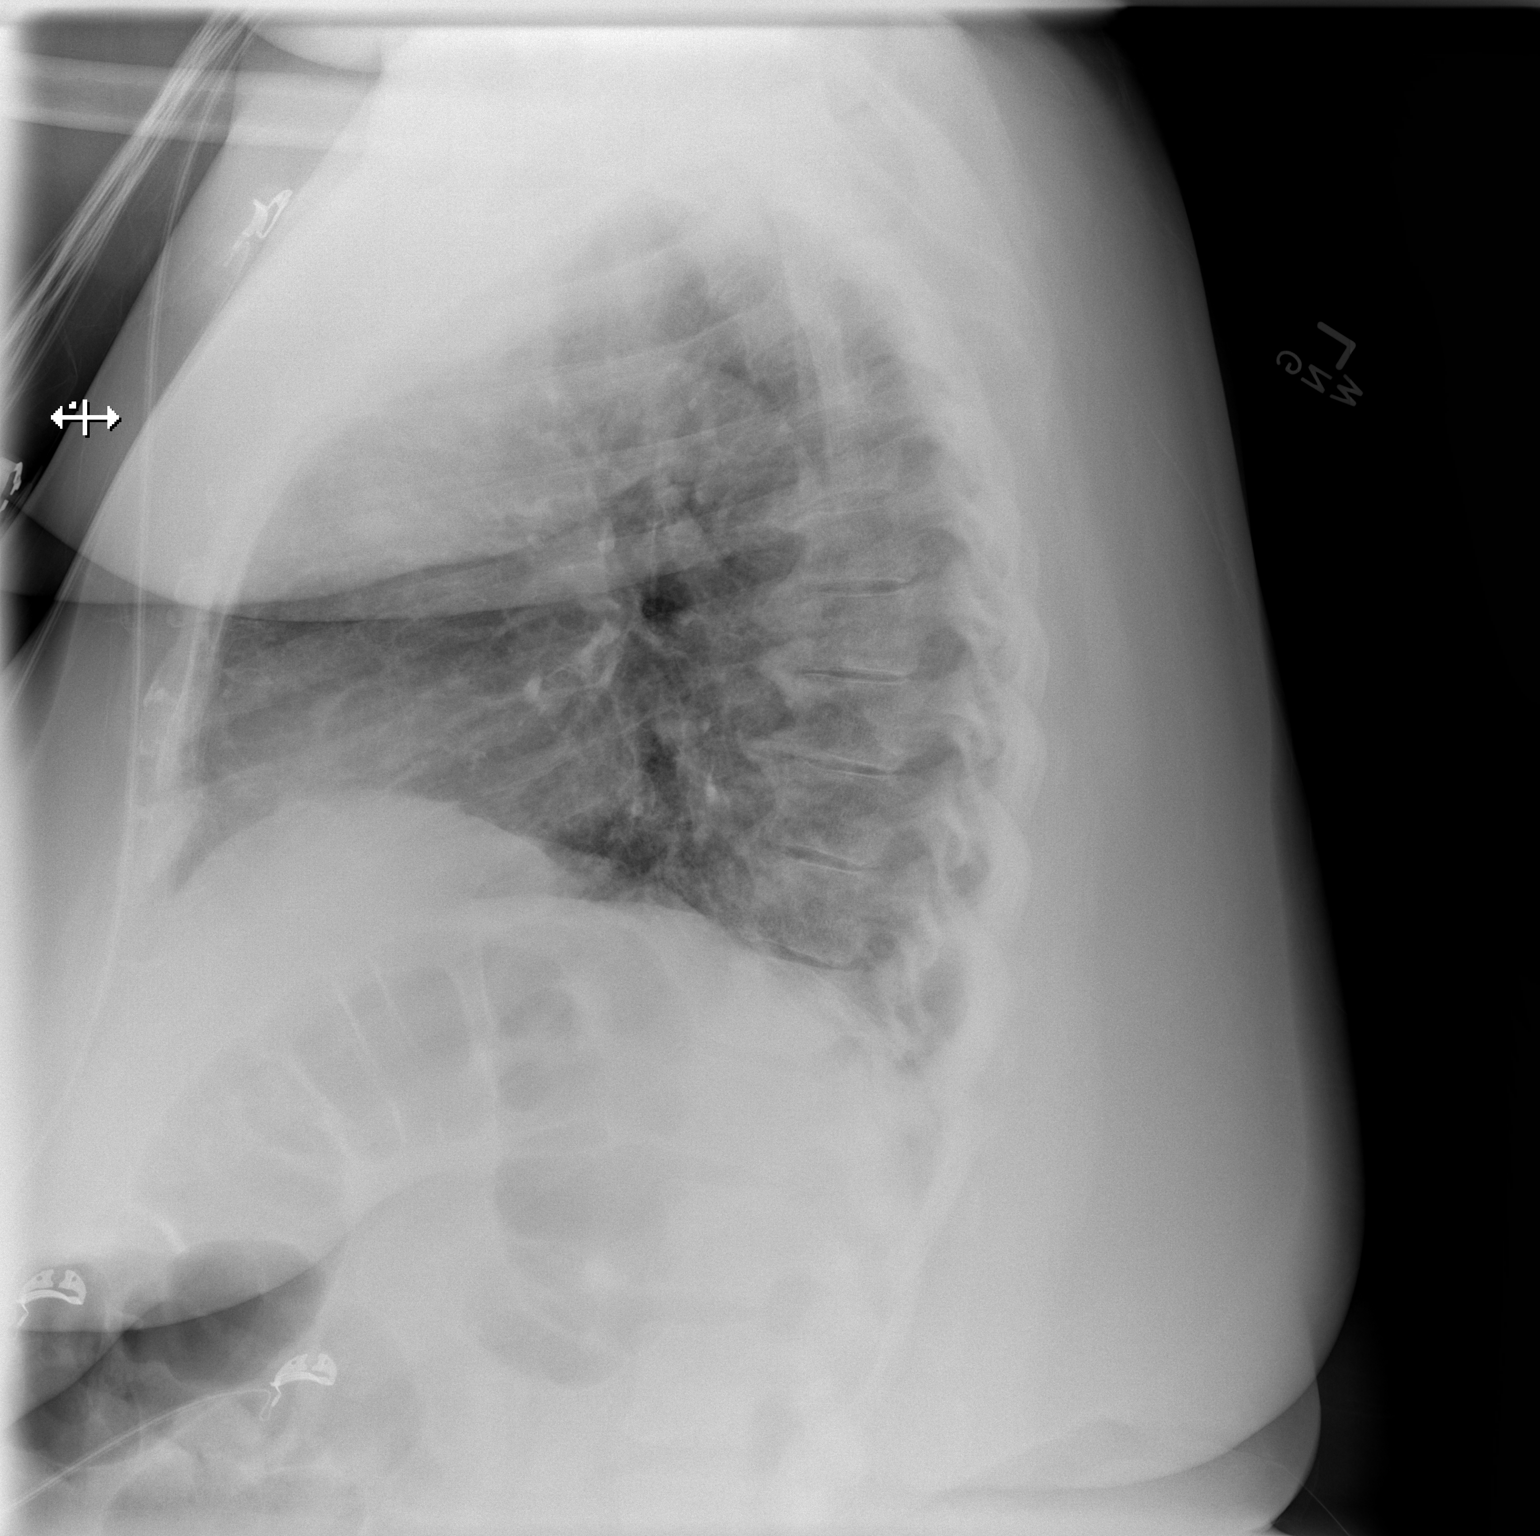

[2 of 2 positions shown; findings below may reference images not displayed]

FINDINGS: The lungs are relatively well-aerated and clear. There is no
evidence of focal opacification, pleural effusion or pneumothorax.

The heart is normal in size; the mediastinal contour is within
normal limits. No acute osseous abnormalities are seen.
IMPRESSION: No acute cardiopulmonary process seen.

## 2015-01-21 ENCOUNTER — Encounter: Attending: Internal Medicine | Primary: Internal Medicine

## 2015-01-22 ENCOUNTER — Ambulatory Visit
Admit: 2015-01-22 | Discharge: 2015-01-22 | Payer: PRIVATE HEALTH INSURANCE | Attending: Internal Medicine | Primary: Internal Medicine

## 2015-01-22 ENCOUNTER — Encounter

## 2015-01-22 DIAGNOSIS — I10 Essential (primary) hypertension: Secondary | ICD-10-CM

## 2015-01-22 MED ORDER — LEVOTHYROXINE 150 MCG TAB
150 mcg | ORAL_TABLET | ORAL | Status: DC
Start: 2015-01-22 — End: 2015-05-01

## 2015-01-22 NOTE — Progress Notes (Signed)
Patient in office today for 3 month follow up.    Do you have an Advance Directive? No  Would you like more information about Advance Directive? Previously given     1. Have you been to the ER, urgent care clinic since your last visit?  Hospitalized since your last visit? 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 Have you seen or consulted any other health care providers outside of the [facility] since your last visit?  Include any pap smears or colon screening. Yes, Patient saw Dr. Angelique Holm (Gastroenterology)

## 2015-01-22 NOTE — Patient Instructions (Addendum)
A Healthy Lifestyle: Care Instructions  Your Care Instructions  A healthy lifestyle can help you feel good, stay at a healthy weight, and have plenty of energy for both work and play. A healthy lifestyle is something you can share with your whole family.  A healthy lifestyle also can lower your risk for serious health problems, such as high blood pressure, heart disease, and diabetes.  You can follow a few steps listed below to improve your health and the health of your family.  Follow-up care is a key part of your treatment and safety. Be sure to make and go to all appointments, and call your doctor if you are having problems. It???s also a good idea to know your test results and keep a list of the medicines you take.  How can you care for yourself at home?  ?? Do not eat too much sugar, fat, or fast foods. You can still have dessert and treats now and then. The goal is moderation.  ?? Start small to improve your eating habits. Pay attention to portion sizes, drink less juice and soda pop, and eat more fruits and vegetables.  ?? Eat a healthy amount of food. A 3-ounce serving of meat, for example, is about the size of a deck of cards. Fill the rest of your plate with vegetables and whole grains.  ?? Limit the amount of soda and sports drinks you have every day. Drink more water when you are thirsty.  ?? Eat at least 5 servings of fruits and vegetables every day. It may seem like a lot, but it is not hard to reach this goal. A serving or helping is 1 piece of fruit, 1 cup of vegetables, or 2 cups of leafy, raw vegetables. Have an apple or some carrot sticks as an afternoon snack instead of a candy bar. Try to have fruits and/or vegetables at every meal.  ?? Make exercise part of your daily routine. You may want to start with simple activities, such as walking, bicycling, or slow swimming. Try to be active 30 to 60 minutes every day. You do not need to do all 30 to 60  minutes all at once. For example, you can exercise 3 times a day for 10 or 20 minutes. Moderate exercise is safe for most people, but it is always a good idea to talk to your doctor before starting an exercise program.  ?? Keep moving. Mow the lawn, work in the garden, or clean your house. Take the stairs instead of the elevator at work.  ?? If you smoke, quit. People who smoke have an increased risk for heart attack, stroke, cancer, and other lung illnesses. Quitting is hard, but there are ways to boost your chance of quitting tobacco for good.  ?? Use nicotine gum, patches, or lozenges.  ?? Ask your doctor about stop-smoking programs and medicines.  ?? Keep trying.  In addition to reducing your risk of diseases in the future, you will notice some benefits soon after you stop using tobacco. If you have shortness of breath or asthma symptoms, they will likely get better within a few weeks after you quit.  ?? Limit how much alcohol you drink. Moderate amounts of alcohol (up to 2 drinks a day for men, 1 drink a day for women) are okay. But drinking too much can lead to liver problems, high blood pressure, and other health problems.  Family health  If you have a family, there are many things you can do   together to improve your health.  ?? Eat meals together as a family as often as possible.  ?? Eat healthy foods. This includes fruits, vegetables, lean meats and dairy, and whole grains.  ?? Include your family in your fitness plan. Most people think of activities such as jogging or tennis as the way to fitness, but there are many ways you and your family can be more active. Anything that makes you breathe hard and gets your heart pumping is exercise. Here are some tips:  ?? Walk to do errands or to take your child to school or the bus.  ?? Go for a family bike ride after dinner instead of watching TV.   Where can you learn more?   Go to [employer]   Enter [phone] in the search box to learn more about "A Healthy Lifestyle: Care Instructions."   ?? 2006-2015 Healthwise, Incorporated. Care instructions adapted under license by [employer] (which disclaims liability or warranty for this information). This care instruction is for use with your licensed healthcare professional. If you have questions about a medical condition or this instruction, always ask your healthcare professional. [place] any warranty or liability for your use of this information.  Content Version: 10.7.482551; Current as of: October 20, 2013              Prediabetes: Care Instructions  Your Care Instructions  Prediabetes is a warning sign that you are at risk for getting type 2 diabetes. It means that your blood sugar is higher than it should be. Most people who get type 2 diabetes have prediabetes first. The good news is that lifestyle changes may help you get your blood sugar back to normal and avoid or delay diabetes. Also, pregnant women who get gestational diabetes may have prediabetes first.  Type 2 diabetes is a lifelong disease in which the body does not respond properly to a hormone called insulin or does not make enough of the hormone. Insulin helps sugar from your food enter your body cells to be used as energy.  Without insulin, the sugar cannot get into the cells to do its work. It stays in the blood instead. This can cause high blood sugar levels. A person has diabetes when the blood sugar stays too high too much of the time.  Follow-up care is a key part of your treatment and safety. Be sure to make and go to all appointments, and call your doctor if you are having problems. It???s also a good idea to know your test results and keep a list of the medicines you take.  How can you care for yourself at home?  ?? Watch your weight. A healthy weight helps your body use insulin properly.  ?? Eat a balanced diet. This may help you prevent or delay diabetes. Try to  eat an even amount of carbohydrate throughout the day. This can help you avoid sudden peaks in blood sugar.  ?? Ask your doctor if you should see a dietitian. A registered dietitian can help you develop a meal plan that fits your lifestyle.  ?? Get at least 30 minutes of exercise on most days of the week. Exercise helps control your blood sugar. It also helps you maintain a healthy weight. Walking is a good choice. You also may want to do other activities, such as running, swimming, cycling, or playing tennis or team sports.  ?? Do not smoke. Smoking can make prediabetes worse. If you need help quitting, talk to your  doctor about stop-smoking programs and medicines. These can increase your chances of quitting for good.  ?? If your doctor prescribed medicines, take them exactly as prescribed. Call your doctor if you think you are having a problem with your medicine. You will get more details on the specific medicines your doctor prescribes.  When should you call for help?  Watch closely for changes in your health, and be sure to contact your doctor if:  ?? You have any symptoms of diabetes. These may include:  ?? Being thirsty more often.  ?? Urinating more.  ?? Being hungrier.  ?? Losing weight.  ?? Being very tired.  ?? Having blurry vision.  ?? You have a wound that will not heal.  ?? You have an infection that will not go away.  ?? You have problems with your blood pressure.  ?? You want more information about diabetes and how you can keep from getting it.   Where can you learn more?   Go to [employer]  Enter I222 in the search box to learn more about "Prediabetes: Care Instructions."   ?? 2006-2015 Healthwise, Incorporated. Care instructions adapted under license by [employer] (which disclaims liability or warranty for this information). This care instruction is for use with your licensed healthcare professional. If you have questions about a medical condition  or this instruction, always ask your healthcare professional. [place] any warranty or liability for your use of this information.  Content Version: 10.7.482551; Current as of: Apr 27, 2014              Starting a Weight Loss Plan: Care Instructions  Your Care Instructions  If you are thinking about losing weight, it can be hard to know where to start. Your doctor can help you set up a weight loss plan that best meets your needs. You may want to take a class on nutrition or exercise, or join a weight loss support group. If you have questions about how to make changes to your eating or exercise habits, ask your doctor about seeing a registered dietitian or an exercise specialist.  It can be a big challenge to lose weight. But you do not have to make huge changes at once. Make small changes, and stick with them. When those changes become habit, add a few more changes.  If you do not think you are ready to make changes right now, try to pick a date in the future. Make an appointment to see your doctor to discuss whether the time is right for you to start a plan.  Follow-up care is a key part of your treatment and safety. Be sure to make and go to all appointments, and call your doctor if you are having problems. It???s also a good idea to know your test results and keep a list of the medicines you take.  How can you care for yourself at home?  ?? Set realistic goals. Many people expect to lose much more weight than is likely. A weight loss of 5% to 10% of your body weight may be enough to improve your health.  ?? Get family and friends involved to provide support. Talk to them about why you are trying to lose weight, and ask them to help. They can help by participating in exercise and having meals with you, even if they may be eating something different.  ?? Find what works best for you. If you do not have time or do not like to  cook, a program that offers meal replacement bars or shakes may be better for you. Or if you like to prepare meals, finding a plan that includes daily menus and recipes may be best.  ?? Ask your doctor about other health professionals who can help you achieve your weight loss goals.  ?? A dietitian can help you make healthy changes in your diet.  ?? An exercise specialist or personal trainer can help you develop a safe and effective exercise program.  ?? A counselor or psychiatrist can help you cope with issues such as depression, anxiety, or family problems that can make it hard to focus on weight loss.  ?? Consider joining a support group for people who are trying to lose weight. Your doctor can suggest groups in your area.   Where can you learn more?   Go to [employer]  Enter U357 in the search box to learn more about "Starting a Weight Loss Plan: Care Instructions."   ?? 2006-2015 Healthwise, Incorporated. Care instructions adapted under license by [employer] (which disclaims liability or warranty for this information). This care instruction is for use with your licensed healthcare professional. If you have questions about a medical condition or this instruction, always ask your healthcare professional. [place] any warranty or liability for your use of this information.  Content Version: 10.7.482551; Current as of: January 26, 2014

## 2015-01-22 NOTE — Progress Notes (Signed)
Chief Complaint   Patient presents with   ??? Blood sugar problem     Prediabetes - 3 month follow up          HPI:  Patient is a 64 year old African American female with medical problems listed below presents today for follow up of Hypertension, Prediabetes, Hyperlipidemia, Hypothyroidism, Vit D def, etc. She has been feeling well and voices no complaints today. She is complaint with her medications with no adverse effects reported and has lost 2 pounds in the last 2 months. She is due for mammogram and already has an appointment scheduled for 01/30/15.      Past Medical History   Diagnosis Date   ??? Iron deficiency anemia 2/44/0102   ??? Lichen planus 07/01/3663   ??? Hypertension    ??? Hypercholesterolemia    ??? Thyroid disease      hypothyroidism   ??? Asthma    ??? Right sided sciatica    ??? Colon polyp    ??? Depression 12/18/2011   ??? OA (osteoarthritis of spine) 02/15/2009   ??? DDD (degenerative disc disease) 02/15/2009   ??? OA (osteoarthritis of spine) 02/15/2009   ??? Sleep apnea 02/15/2009     uses cpap machine   ??? History of echocardiogram 03/20/2004     EF 70%.  Borderline DDfx.  No significant valvular pathology.   ??? Thallium stress test  03/20/2004     Partially transient, mod basal & mid anterior defect most c/w artifact; mild anterior ischemia less likely.  Neg EKG on pharm stress test.   ??? Lower extremity venous duplex 10/23/2009     Left leg:  No DVT.     ??? Anemia    ??? Shortness of breath      Possible asthma, HCVD; less likely CAD (Noted 03/15/09)   ??? Essential hypertension, benign    ??? Other and unspecified hyperlipidemia    ??? Pre-operative cardiovascular examination      For spine surgery   ??? Obesity, unspecified    ??? Meralgia paraesthetica 10/22/2013     Allergies   Allergen Reactions   ??? Asa-Acetaminophen-Caff-Potass Other (comments)     Bleeding in stomach   ??? Benadryl [Diphenhydramine Hcl] Other (comments)     Muscle jerking   ??? Other Plant, Animal, Environmental Not Reported This Time     Grass, Dust and Mold    ??? Pollen Extracts Not Reported This Time     Current Outpatient Prescriptions   Medication Sig Dispense Refill   ??? rosuvastatin (CRESTOR) 40 mg tablet Take 1 Tab by mouth daily. 30 Tab 3   ??? valsartan-hydrochlorothiazide (DIOVAN-HCT) 160-25 mg per tablet Take 1 Tab by mouth daily. 30 Tab 3   ??? montelukast (SINGULAIR) 10 mg tablet Take 1 Tab by mouth nightly as needed. Indications: asthma 90 Tab 3   ??? levothyroxine (SYNTHROID) 150 mcg tablet take 1 tablet by mouth BEFORE BREAKFAST 30 Tab 3   ??? ranitidine (ZANTAC) 150 mg tablet Take 150 mg by mouth two (2) times a day.     ??? albuterol (PROVENTIL HFA) 90 mcg/actuation inhaler Take 2 Puffs by inhalation every six (6) hours as needed for Wheezing. 1 Inhaler 5   ??? gabapentin (NEURONTIN) 300 mg capsule Take 2 capsules at bedtime and 1 capsule in the morning. 90 Cap 3   ??? omeprazole (PRILOSEC) 40 mg capsule take 1 capsule by mouth once daily BEFORE BREAKFAST 30 Cap 5   ??? budesonide-formoterol (SYMBICORT) 160-4.5 mcg/actuation HFA inhaler  Take 2 Puffs by inhalation two (2) times a day. 1 Inhaler 5   ??? IRON DEXTRAN COMPLEX (IRON DEXTRAN IV) by IntraVENous route.     ??? Cholecalciferol, Vitamin D3, (VITAMIN D-3) 5,000 unit Tab Take  by mouth daily.     ??? traMADol (ULTRAM) 50 mg tablet Take 1 Tab by mouth every six (6) hours as needed for Pain. Max Daily Amount: 200 mg. 40 Tab 0          ROS:  Constitutional: Negative for fever, chills, or fatigue  Neurological: Negative for headache, dizziness, visual disturbance, or loss of conciousness  Respiratory: Negative for SOB  Cardiovascular: Negative for chest pain, palpitation, or leg swelling  Gastrointestinal: Negative for abdominal pain, nausea, or vomiting  Musculoskeletal: Negative for falls      Physical Exam:  BP 108/66 mmHg   Pulse 92   Temp(Src) 98.2 ??F (36.8 ??C) (Oral)   Resp 18   Ht 5\' 2"  (1.575 m)   Wt 198 lb (89.812 kg)   BMI 36.21 kg/m2   SpO2 97%  General: Obese black female, a & o x 3, afebrile, interacting  appropriately, in no acute distress  Skin: warm and dry, no rashes , no bruises  Neck: supple, symmetrical, no thyromegaly  Respiratory: symmetrical chest expansion, lung sounds clear bilaterally, good air entry, good respiratory effort, no wheezes or crackles  Cardiovascular: normal S1S2, regular rate and rhythm, no murmurs, pulses palpable, no thrill, no carotid or abdominal bruits, no peripheral edema, no JVD  Abdomen: non-distended, normoactive bowel sounds x 4 quadrants, soft, non-tender to palpation  Musculoskeletal: normal ROM on all joints, no swelling or deformity, no perilumbar tenderness, steady gait      Assessment/Plan:    ICD-10-CM ICD-9-CM    1. HTN (hypertension), benign I10 401.1 Well controlled  Continue current meds and she was counseled on low salt diet  CBC WITH AUTOMATED DIFF      METABOLIC PANEL, COMPREHENSIVE      T4, FREE      TSH, 3RD GENERATION   2. 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 Hypercholesterolemia E78.0 272.0 Continue Crestor and she was counseled on low fat diet - will check lipid panel at f/u in 3 months  LIPID PANEL   4. Vitamin D deficiency E55.9 268.9 VITAMIN D, 25 HYDROXY   5. Obesity (BMI 30-39.9) E66.9 278.00 Discussed the patient's above normal BMI with her.  I have recommended the following interventions: dietary management education, guidance, and counseling  encourage exercise  lifestyle education regarding diet .  The BMI follow up plan is as follows: BMI is out of normal parameters and plan is as follows: I have counseled this patient on diet and exercise regimens   6. Pre-diabetes R73.09 790.29 Recent HBA1C is 5.8  Counseled on diet and exercise  HEMOGLOBIN A1C   7. Need for hepatitis C screening test Z11.59 V73.89 Hep C AB will checked at f/u in 3 months       Orders Placed This Encounter   ??? CBC WITH AUTOMATED DIFF     Standing Status: Future      Number of Occurrences:        Standing Expiration Date: 07/21/2015   ??? HEMOGLOBIN A1C     Standing Status: Future      Number of Occurrences:       Standing Expiration Date: 05/22/2015   ??? LIPID PANEL     Standing Status:  Future      Number of Occurrences:       Standing Expiration Date: 5/36/1443   ??? METABOLIC PANEL, COMPREHENSIVE     Standing Status: Future      Number of Occurrences:       Standing Expiration Date: 07/21/2015   ??? T4, FREE     Standing Status: Future      Number of Occurrences:       Standing Expiration Date: 07/21/2015   ??? TSH, 3RD GENERATION     Standing Status: Future      Number of Occurrences:       Standing Expiration Date: 05/22/2015   ??? VITAMIN D, 25 HYDROXY     Standing Status: Future      Number of Occurrences:       Standing Expiration Date: 05/22/2015   ??? HEPATITIS C AB     Standing Status: Future      Number of Occurrences:       Standing Expiration Date: 01/24/2016   ??? levothyroxine (SYNTHROID) 150 mcg tablet     Sig: take 1 tablet by mouth BEFORE BREAKFAST     Dispense:  30 Tab     Refill:  3     Script for zoster vaccine was given to pt today    Additional Notes: Discussed today's diagnosis, treatment plans. Discussed medication indications and side effects.    Weight Management:Counseled  After Visit Summary: Discussed provided printed patient instructions. Answered questions accordingly.  Follow-up Disposition: In 3 months with labs 1 week prior          Osie Cheeks, DO, MPH  Internal Medicine

## 2015-02-22 ENCOUNTER — Encounter

## 2015-02-22 MED ORDER — OMEPRAZOLE 40 MG CAP, DELAYED RELEASE
40 mg | ORAL_CAPSULE | ORAL | Status: DC
Start: 2015-02-22 — End: 2015-07-08

## 2015-02-22 NOTE — Telephone Encounter (Signed)
[facility] office to inform patient that Omeprazole was refilled and sent to the pharmacy.

## 2015-02-22 NOTE — Telephone Encounter (Signed)
Last OV 01/22/15  Next OV 04/22/15

## 2015-02-22 NOTE — Telephone Encounter (Signed)
Omeprazole refilled and sent to pharmacy. Please notify pt.

## 2015-03-05 MED ORDER — GABAPENTIN 300 MG CAP
300 mg | ORAL_CAPSULE | ORAL | Status: DC
Start: 2015-03-05 — End: 2015-07-08

## 2015-03-05 NOTE — Telephone Encounter (Signed)
Gabapentin refilled and sent to pharmacy. Please notify pt.

## 2015-03-05 NOTE — Telephone Encounter (Signed)
Unable to leave message for no vm noted.

## 2015-04-08 MED ORDER — GABAPENTIN 300 MG CAP
300 mg | ORAL_CAPSULE | ORAL | Status: DC
Start: 2015-04-08 — End: 2015-09-13

## 2015-04-08 NOTE — Telephone Encounter (Signed)
Gabapentin refilled and sent to pharmacy. Please notify pt.

## 2015-04-08 NOTE — Telephone Encounter (Signed)
[facility] office to inform patient that Gabapentin was refilled and sent to the pharmacy.

## 2015-04-22 ENCOUNTER — Encounter: Attending: Internal Medicine | Primary: Internal Medicine

## 2015-04-22 ENCOUNTER — Encounter

## 2015-04-25 ENCOUNTER — Encounter

## 2015-04-25 MED ORDER — ROSUVASTATIN 40 MG TAB
40 mg | ORAL_TABLET | Freq: Every day | ORAL | Status: DC
Start: 2015-04-25 — End: 2015-07-08

## 2015-04-25 NOTE — Telephone Encounter (Signed)
Crestor refilled and sent to pharmacy. Please notify pt.

## 2015-04-25 NOTE — Telephone Encounter (Signed)
Last filled 12/25/2014  Last OV 01/22/2015  Next OV 04/29/2015

## 2015-04-27 ENCOUNTER — Inpatient Hospital Stay: Admit: 2015-04-27 | Primary: Internal Medicine

## 2015-04-29 ENCOUNTER — Ambulatory Visit
Admit: 2015-04-29 | Discharge: 2015-04-29 | Payer: PRIVATE HEALTH INSURANCE | Attending: Internal Medicine | Primary: Internal Medicine

## 2015-04-29 DIAGNOSIS — R7303 Prediabetes: Secondary | ICD-10-CM

## 2015-04-29 LAB — CBC WITH AUTOMATED DIFF
ABS. BASOPHILS: 0 10*3/uL (ref 0.0–0.2)
ABS. EOSINOPHILS: 0.2 10*3/uL (ref 0.0–0.4)
ABS. IMM. GRANS.: 0 10*3/uL (ref 0.0–0.1)
ABS. MONOCYTES: 0.5 10*3/uL (ref 0.1–0.9)
ABS. NEUTROPHILS: 3.8 10*3/uL (ref 1.4–7.0)
Abs Lymphocytes: 1 10*3/uL (ref 0.7–3.1)
BASOPHILS: 0 %
EOSINOPHILS: 3 %
HCT: 33.4 % — ABNORMAL LOW (ref 34.0–46.6)
HGB: 10.8 g/dL — ABNORMAL LOW (ref 11.1–15.9)
IMMATURE GRANULOCYTES: 0 %
Lymphocytes: 19 %
MCH: 24.5 pg — ABNORMAL LOW (ref 26.6–33.0)
MCHC: 32.3 g/dL (ref 31.5–35.7)
MCV: 76 fL — ABNORMAL LOW (ref 79–97)
MONOCYTES: 9 %
NEUTROPHILS: 69 %
PLATELET: 289 10*3/uL (ref 150–379)
RBC: 4.4 x10E6/uL (ref 3.77–5.28)
RDW: 16.3 % — ABNORMAL HIGH (ref 12.3–15.4)
WBC: 5.5 10*3/uL (ref 3.4–10.8)

## 2015-04-29 LAB — VITAMIN D, 25 HYDROXY: VITAMIN D, 25-HYDROXY: 55 ng/mL (ref 30.0–100.0)

## 2015-04-29 LAB — METABOLIC PANEL, COMPREHENSIVE
A-G Ratio: 1.6 (ref 1.1–2.5)
ALT (SGPT): 17 IU/L (ref 0–32)
AST (SGOT): 17 IU/L (ref 0–40)
Albumin: 4.2 g/dL (ref 3.6–4.8)
Alk. phosphatase: 69 IU/L (ref 39–117)
BUN/Creatinine ratio: 20 (ref 11–26)
BUN: 14 mg/dL (ref 8–27)
Bilirubin, total: 0.5 mg/dL (ref 0.0–1.2)
CO2: 28 mmol/L (ref 18–29)
Calcium: 9.7 mg/dL (ref 8.7–10.3)
Chloride: 99 mmol/L (ref 97–108)
Creatinine: 0.71 mg/dL (ref 0.57–1.00)
GFR est AA: 103 mL/min/{1.73_m2} (ref >59)
GFR est non-AA: 90 mL/min/{1.73_m2} (ref >59)
GLOBULIN, TOTAL: 2.7 g/dL (ref 1.5–4.5)
Glucose: 96 mg/dL (ref 65–99)
Potassium: 3.8 mmol/L (ref 3.5–5.2)
Protein, total: 6.9 g/dL (ref 6.0–8.5)
Sodium: 143 mmol/L (ref 134–144)

## 2015-04-29 LAB — HEMOGLOBIN A1C W/O EAG: Hemoglobin A1c: 6.2 % — ABNORMAL HIGH (ref 4.8–5.6)

## 2015-04-29 LAB — LIPID PANEL
Cholesterol, total: 155 mg/dL (ref 100–199)
HDL Cholesterol: 49 mg/dL (ref >39)
LDL, calculated: 89 mg/dL (ref 0–99)
Triglyceride: 87 mg/dL (ref 0–149)
VLDL, calculated: 17 mg/dL (ref 5–40)

## 2015-04-29 LAB — CVD REPORT

## 2015-04-29 LAB — T4, FREE: T4, Free: 2.24 ng/dL — ABNORMAL HIGH (ref 0.82–1.77)

## 2015-04-29 LAB — TSH 3RD GENERATION: TSH: 0.079 u[IU]/mL — ABNORMAL LOW (ref 0.450–4.500)

## 2015-04-29 MED ORDER — LEVOTHYROXINE 75 MCG TAB
75 mcg | ORAL_TABLET | Freq: Every day | ORAL | Status: DC
Start: 2015-04-29 — End: 2015-07-08

## 2015-04-29 NOTE — Progress Notes (Signed)
Patient is in the office today for 3 month follow up.    Do you have an Advance Directive no  Do you want more information no    1. Have you been to the ER, urgent care clinic since your last visit?  Hospitalized since your last visit?No    2. Have you seen or consulted any other health care providers outside of the [facility] since your last visit?  Include any pap smears or colon screening. No

## 2015-04-29 NOTE — Progress Notes (Signed)
Chief Complaint   Patient presents with   ??? Hypertension         HPI:  Patient is a 64 year old obese African American female with medical problems listed below presents today for follow up of Hypertension, Prediabetes, Hyperlipidemia, Hypothyroidism, Vit D def, etc. She has been feeling well and voices no complaints today. She is complaint with her medications with no adverse effects reported. She has modified her diet and lost 8 pounds in the last 3 months.        Past Medical History   Diagnosis Date   ??? Iron deficiency anemia 6/96/2952   ??? Lichen planus 8/41/3244   ??? Hypertension    ??? Hypercholesterolemia    ??? Thyroid disease      hypothyroidism   ??? Asthma    ??? Right sided sciatica    ??? Colon polyp    ??? Depression 12/18/2011   ??? OA (osteoarthritis of spine) 02/15/2009   ??? DDD (degenerative disc disease) 02/15/2009   ??? OA (osteoarthritis of spine) 02/15/2009   ??? Sleep apnea 02/15/2009     uses cpap machine   ??? History of echocardiogram 03/20/2004     EF 70%.  Borderline DDfx.  No significant valvular pathology.   ??? Thallium stress test  03/20/2004     Partially transient, mod basal & mid anterior defect most c/w artifact; mild anterior ischemia less likely.  Neg EKG on pharm stress test.   ??? Lower extremity venous duplex 10/23/2009     Left leg:  No DVT.     ??? Anemia    ??? Shortness of breath      Possible asthma, HCVD; less likely CAD (Noted 03/15/09)   ??? Essential hypertension, benign    ??? Other and unspecified hyperlipidemia    ??? Pre-operative cardiovascular examination      For spine surgery   ??? Obesity, unspecified    ??? Meralgia paraesthetica 10/22/2013     Allergies   Allergen Reactions   ??? Asa-Acetaminophen-Caff-Potass Other (comments)     Bleeding in stomach   ??? Benadryl [Diphenhydramine Hcl] Other (comments)     Muscle jerking   ??? Other Plant, Animal, Environmental Not Reported This Time     Grass, Dust and Mold   ??? Pollen Extracts Not Reported This Time     Current Outpatient Prescriptions    Medication Sig Dispense Refill   ??? rosuvastatin (CRESTOR) 40 mg tablet Take 1 Tab by mouth daily. 30 Tab 3   ??? gabapentin (NEURONTIN) 300 mg capsule take 1 capsule by mouth every morning and 2 capsules by mouth at bedtime 90 Cap 3   ??? gabapentin (NEURONTIN) 300 mg capsule take 1 capsule by mouth every morning and 2 capsules by mouth at bedtime 90 Cap 0   ??? omeprazole (PRILOSEC) 40 mg capsule take 1 capsule by mouth once daily BEFORE BREAKFAST 30 Cap 5   ??? levothyroxine (SYNTHROID) 150 mcg tablet take 1 tablet by mouth BEFORE BREAKFAST 30 Tab 3   ??? valsartan-hydrochlorothiazide (DIOVAN-HCT) 160-25 mg per tablet Take 1 Tab by mouth daily. 30 Tab 3   ??? montelukast (SINGULAIR) 10 mg tablet Take 1 Tab by mouth nightly as needed. Indications: asthma 90 Tab 3   ??? ranitidine (ZANTAC) 150 mg tablet Take 150 mg by mouth two (2) times a day.     ??? albuterol (PROVENTIL HFA) 90 mcg/actuation inhaler Take 2 Puffs by inhalation every six (6) hours as needed for Wheezing. 1 Inhaler 5   ???  budesonide-formoterol (SYMBICORT) 160-4.5 mcg/actuation HFA inhaler Take 2 Puffs by inhalation two (2) times a day. 1 Inhaler 5   ??? IRON DEXTRAN COMPLEX (IRON DEXTRAN IV) by IntraVENous route.     ??? Cholecalciferol, Vitamin D3, (VITAMIN D-3) 5,000 unit Tab Take  by mouth daily.     ??? traMADol (ULTRAM) 50 mg tablet Take 1 Tab by mouth every six (6) hours as needed for Pain. Max Daily Amount: 200 mg. 40 Tab 0          ROS:  Constitutional: Negative for fever, chills, or fatigue  Neurological: Negative for headache, dizziness, visual disturbance, or loss of conciousness  Respiratory: Negative for SOB  Cardiovascular: Negative for chest pain, palpitation, or leg swelling  Gastrointestinal: Negative for abdominal pain, nausea, or vomiting  Musculoskeletal: Negative for falls        Physical Exam:  BP 110/64 mmHg   Pulse 87   Temp(Src) 98 ??F (36.7 ??C) (Oral)   Resp 16   Ht 5\' 2"  (1.575 m)   Wt 190 lb (86.183 kg)   BMI 34.74 kg/m2   SpO2 97%   General: Obese black female, a & o x 3, afebrile, interacting appropriately, in no acute distress  Skin: warm and dry, no rashes , no bruises  Neck: supple, symmetrical, no thyromegaly  Respiratory: symmetrical chest expansion, lung sounds clear bilaterally, good air entry, good respiratory effort, no wheezes or crackles  Cardiovascular: normal S1S2, regular rate and rhythm, no murmurs, pulses palpable, no thrill, no carotid or abdominal bruits, no peripheral edema, no JVD  Abdomen: non-distended, normoactive bowel sounds x 4 quadrants, soft, non-tender to palpation  Musculoskeletal: normal ROM on all joints, no swelling or deformity, no perilumbar tenderness, steady gait      Assessment/Plan:    ICD-10-CM ICD-9-CM    1. Pre-diabetes R73.09 790.29 Recent HBA1C is 6.2  Counseled on diet and exercise  HEMOGLOBIN A1C W/O EAG   2. HTN (hypertension), benign I10 401.1 Well controlled  Continue current meds and she was counseled on low salt diet   3. Obesity (BMI 30-39.9) E66.9 278.00 Discussed the patient's above normal BMI with her.  I have recommended the following interventions: dietary management education, guidance, and counseling  encourage exercise  lifestyle education regarding diet .  The BMI follow up plan is as follows: BMI is out of normal parameters and plan is as follows: I have counseled this patient on diet and exercise regimens   4. Vitamin D deficiency E55.9 268.9 Recent Vit D is good   5. Hypercholesterolemia E78.0 272.0 Recent lipid panel done 04/27/15 reviewed with pt and improved revealing Cho 155 and LDL 89  Continue Crestor and she was counseled on low fat diet   6. 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 Need for hepatitis C screening test Z11.59 V73.89 Hep C AB will be checked at f/u in 3 months   8. Encounter for screening mammogram for breast cancer Z12.31 V76.12 MAM MAMMO BI SCREENING DIGTL   9. Post-menopausal Z78.0 V49.81 DEXA BONE DENSITY STUDY AXIAL         Orders Placed This Encounter   ??? DEXA BONE DENSITY STUDY AXIAL  Standing Status: Future      Number of Occurrences:       Standing Expiration Date: 05/29/2016     Order Specific Question:  Reason for Exam     Answer:  Post menopausal   ??? MAM MAMMO BI SCREENING DIGTL     Standing Status: Future      Number of Occurrences:       Standing Expiration Date: 05/29/2016     Order Specific Question:  Reason for Exam     Answer:  Screening mammogram   ??? HEMOGLOBIN A1C W/O EAG     Standing Status: Future      Number of Occurrences:       Standing Expiration Date: 04/29/2016   ??? TSH 3RD GENERATION     Standing Status: Future      Number of Occurrences:       Standing Expiration Date: 08/27/2015   ??? T4, FREE     Standing Status: Future      Number of Occurrences:       Standing Expiration Date: 10/26/2015   ??? levothyroxine (SYNTHROID) 75 mcg tablet     Sig: Take 1 Tab by mouth Daily (before breakfast).     Dispense:  30 Tab     Refill:  3       Recent labs reviewed with pt      Additional Notes: Discussed today's diagnosis, treatment plans. Discussed medication indications and side effects.    Weight Management:Counseled  After Visit Summary: Discussed provided printed patient instructions. Answered questions accordingly.  Follow-up Disposition: In 3 months with labs 1 week prior          Osie Cheeks, DO, MPH  Internal Medicine

## 2015-04-29 NOTE — Patient Instructions (Addendum)
High Blood Pressure: Care Instructions  Your Care Instructions  If your blood pressure is usually above 140/90, you have high blood pressure, or hypertension. That means the top number is 140 or higher or the bottom number is 90 or higher, or both.  Despite what a lot of people think, high blood pressure usually doesn't cause headaches or make you feel dizzy or lightheaded. It usually has no symptoms. But it does increase your risk for heart attack, stroke, and kidney or eye damage. The higher your blood pressure, the more your risk increases.  Your doctor will give you a goal for your blood pressure. Your goal will be based on your health and your age. An example of a goal is to keep your blood pressure below 140/90.  Lifestyle changes, such as eating healthy and being active, are always important to help lower blood pressure. You might also take medicine to reach your blood pressure goal.  Follow-up care is a key part of your treatment and safety. Be sure to make and go to all appointments, and call your doctor if you are having problems. It's also a good idea to know your test results and keep a list of the medicines you take.  How can you care for yourself at home?  Medical treatment  ?? If you stop taking your medicine, your blood pressure will go back up. You may take one or more types of medicine to lower your blood pressure. Be safe with medicines. Take your medicine exactly as prescribed. Call your doctor if you think you are having a problem with your medicine.  ?? Talk to your doctor before you start taking aspirin every day. Aspirin can help certain people lower their risk of a heart attack or stroke. But taking aspirin isn't right for everyone, because it can cause serious bleeding.  ?? See your doctor regularly. You may need to see the doctor more often at first or until your blood pressure comes down.  ?? If you are taking blood pressure medicine, talk to your doctor before  you take decongestants or anti-inflammatory medicine, such as ibuprofen. Some of these medicines can raise blood pressure.  ?? Learn how to check your blood pressure at home.  Lifestyle changes  ?? Stay at a healthy weight. This is especially important if you put on weight around the waist. Losing even 10 pounds can help you lower your blood pressure.  ?? If your doctor recommends it, get more exercise. Walking is a good choice. Bit by bit, increase the amount you walk every day. Try for at least 30 minutes on most days of the week. You also may want to swim, bike, or do other activities.  ?? Avoid or limit alcohol. Talk to your doctor about whether you can drink any alcohol.  ?? Try to limit how much sodium you eat to less than 2,300 milligrams (mg) a day. Your doctor may ask you to try to eat less than 1,500 mg a day.  ?? Eat plenty of fruits (such as bananas and oranges), vegetables, legumes, whole grains, and low-fat dairy products.  ?? Lower the amount of saturated fat in your diet. Saturated fat is found in animal products such as milk, cheese, and meat. Limiting these foods may help you lose weight and also lower your risk for heart disease.  ?? Do not smoke. Smoking increases your risk for heart attack and stroke. If you need help quitting, talk to your doctor about stop-smoking programs and medicines.   These can increase your chances of quitting for good.  When should you call for help?  Call your doctor now or seek immediate medical care if:  ?? Your blood pressure is much higher than normal (such as 180/110 or higher).  ?? You think high blood pressure is causing symptoms such as:  ?? Severe headache.  ?? Blurry vision.  Watch closely for changes in your health, and be sure to contact your doctor if:  ?? You do not get better as expected.  Where can you learn more?  Go to [employer]  Enter [phone] in the search box to learn more about "High Blood Pressure: Care Instructions."   ?? 2006-2016 [employer]. Care instructions adapted under license by [employer] (which disclaims liability or warranty for this information). This care instruction is for use with your licensed healthcare professional. If you have questions about a medical condition or this instruction, always ask your healthcare professional. [employer] any warranty or liability for your use of this information.  Content Version: 10.9.538570; Current as of: January 02, 2015        Prediabetes: Care Instructions  Your Care Instructions  Prediabetes is a warning sign that you are at risk for getting type 2 diabetes. It means that your blood sugar is higher than it should be. Most people who get type 2 diabetes have prediabetes first. The good news is that lifestyle changes may help you get your blood sugar back to normal and avoid or delay diabetes. Also, pregnant women who get gestational diabetes may have prediabetes first.  Type 2 diabetes is a disease in which the body does not respond properly to a hormone called insulin. Over time, the body does not make enough of the hormone. Insulin helps sugar from your food enter your body cells to be used as energy.  Without insulin, the sugar cannot get into the cells to do its work. It stays in the blood instead. This can cause high blood sugar levels. A person has diabetes when the blood sugar stays too high too much of the time.  Follow-up care is a key part of your treatment and safety. Be sure to make and go to all appointments, and call your doctor if you are having problems. It's also a good idea to know your test results and keep a list of the medicines you take.  How can you care for yourself at home?  ?? Watch your weight. A healthy weight helps your body use insulin properly.  ?? Eat a balanced diet. This may help you prevent or delay diabetes. Try to eat an even amount of carbohydrate throughout the day. This can help you  avoid sudden peaks in blood sugar.  ?? Ask your doctor if you should see a dietitian. A registered dietitian can help you develop a meal plan that fits your lifestyle.  ?? Get at least 30 minutes of exercise on most days of the week. Exercise helps control your blood sugar. It also helps you maintain a healthy weight. Walking is a good choice. You also may want to do other activities, such as running, swimming, cycling, or playing tennis or team sports.  ?? Do not smoke. Smoking can make prediabetes worse. If you need help quitting, talk to your doctor about stop-smoking programs and medicines. These can increase your chances of quitting for good.  ?? If your doctor prescribed medicines, take them exactly as prescribed. Call your doctor if you think you are having  a problem with your medicine. You will get more details on the specific medicines your doctor prescribes.  When should you call for help?  Watch closely for changes in your health, and be sure to contact your doctor if:  ?? You have any symptoms of diabetes. These may include:  ?? Being thirsty more often.  ?? Urinating more.  ?? Being hungrier.  ?? Losing weight.  ?? Being very tired.  ?? Having blurry vision.  ?? You have a wound that will not heal.  ?? You have an infection that will not go away.  ?? You have problems with your blood pressure.  ?? You want more information about diabetes and how you can keep from getting it.  Where can you learn more?  Go to [employer]  Enter I222 in the search box to learn more about "Prediabetes: Care Instructions."  ?? 2006-2016 [employer]. Care instructions adapted under license by [employer] (which disclaims liability or warranty for this information). This care instruction is for use with your licensed healthcare professional. If you have questions about a medical condition or this instruction, always ask your healthcare professional. [place] any warranty or liability for your use of this information.  Content Version: 10.9.538570; Current as of: November 16, 2014        Starting a Weight Loss Plan: Care Instructions  Your Care Instructions  If you are thinking about losing weight, it can be hard to know where to start. Your doctor can help you set up a weight loss plan that best meets your needs. You may want to take a class on nutrition or exercise, or join a weight loss support group. If you have questions about how to make changes to your eating or exercise habits, ask your doctor about seeing a registered dietitian or an exercise specialist.  It can be a big challenge to lose weight. But you do not have to make huge changes at once. Make small changes, and stick with them. When those changes become habit, add a few more changes.  If you do not think you are ready to make changes right now, try to pick a date in the future. Make an appointment to see your doctor to discuss whether the time is right for you to start a plan.  Follow-up care is a key part of your treatment and safety. Be sure to make and go to all appointments, and call your doctor if you are having problems. It???s also a good idea to know your test results and keep a list of the medicines you take.  How can you care for yourself at home?  ?? Set realistic goals. Many people expect to lose much more weight than is likely. A weight loss of 5% to 10% of your body weight may be enough to improve your health.  ?? Get family and friends involved to provide support. Talk to them about why you are trying to lose weight, and ask them to help. They can help by participating in exercise and having meals with you, even if they may be eating something different.  ?? Find what works best for you. If you do not have time or do not like to cook, a program that offers meal replacement bars or shakes may be better for you. Or if you like to prepare meals, finding a plan that includes  daily menus and recipes may be best.  ?? Ask your doctor about other health professionals who  can help you achieve your weight loss goals.  ?? A dietitian can help you make healthy changes in your diet.  ?? An exercise specialist or personal trainer can help you develop a safe and effective exercise program.  ?? A counselor or psychiatrist can help you cope with issues such as depression, anxiety, or family problems that can make it hard to focus on weight loss.  ?? Consider joining a support group for people who are trying to lose weight. Your doctor can suggest groups in your area.  Where can you learn more?  Go to [employer]  Enter U357 in the search box to learn more about "Starting a Weight Loss Plan: Care Instructions."  ?? 2006-2016 [employer]. Care instructions adapted under license by [employer] (which disclaims liability or warranty for this information). This care instruction is for use with your licensed healthcare professional. If you have questions about a medical condition or this instruction, always ask your healthcare professional. [place] any warranty or liability for your use of this information.  Content Version: 10.9.538570; Current as of: January 22, 2015

## 2015-05-07 ENCOUNTER — Encounter

## 2015-05-08 MED ORDER — VALSARTAN-HYDROCHLOROTHIAZIDE 160 MG-25 MG TAB
160-25 mg | ORAL_TABLET | ORAL | Status: DC
Start: 2015-05-08 — End: 2015-07-08

## 2015-05-08 NOTE — Telephone Encounter (Signed)
Diovan-HCTZ refilled and sent to pharmacy. Please notify pt.

## 2015-05-09 NOTE — Telephone Encounter (Signed)
Patient is aware that medication was sent to the pharmacy.

## 2015-06-14 NOTE — Telephone Encounter (Signed)
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 Thanks

## 2015-06-17 ENCOUNTER — Encounter

## 2015-06-17 NOTE — Telephone Encounter (Signed)
Please call and notify pt that she has been referred to neurologist Dr. Chyrel Masson

## 2015-06-18 NOTE — Telephone Encounter (Signed)
Left detailed message for patient with the following appointment information    Dr. Gretchen Portela [facility] [phone]  July 16, 2015 at 9:00 am check in at 8:30 am  [place]

## 2015-07-08 ENCOUNTER — Encounter: Attending: Internal Medicine | Primary: Internal Medicine

## 2015-07-08 ENCOUNTER — Ambulatory Visit
Admit: 2015-07-08 | Discharge: 2015-07-08 | Payer: PRIVATE HEALTH INSURANCE | Attending: Internal Medicine | Primary: Internal Medicine

## 2015-07-08 DIAGNOSIS — I10 Essential (primary) hypertension: Secondary | ICD-10-CM

## 2015-07-08 MED ORDER — ROSUVASTATIN 40 MG TAB
40 mg | ORAL_TABLET | Freq: Every day | ORAL | Status: DC
Start: 2015-07-08 — End: 2016-02-27

## 2015-07-08 MED ORDER — RANITIDINE 150 MG TAB
150 mg | ORAL_TABLET | Freq: Two times a day (BID) | ORAL | Status: DC
Start: 2015-07-08 — End: 2016-10-20

## 2015-07-08 MED ORDER — MONTELUKAST 10 MG TAB
10 mg | ORAL_TABLET | Freq: Every evening | ORAL | Status: DC | PRN
Start: 2015-07-08 — End: 2016-01-24

## 2015-07-08 MED ORDER — OMEPRAZOLE 40 MG CAP, DELAYED RELEASE
40 mg | ORAL_CAPSULE | ORAL | Status: DC
Start: 2015-07-08 — End: 2016-04-02

## 2015-07-08 MED ORDER — VALSARTAN-HYDROCHLOROTHIAZIDE 160 MG-25 MG TAB
160-25 mg | ORAL_TABLET | ORAL | Status: DC
Start: 2015-07-08 — End: 2015-09-04

## 2015-07-08 MED ORDER — ALBUTEROL SULFATE HFA 90 MCG/ACTUATION AEROSOL INHALER
90 mcg/actuation | Freq: Four times a day (QID) | RESPIRATORY_TRACT | Status: DC | PRN
Start: 2015-07-08 — End: 2016-07-03

## 2015-07-08 MED ORDER — LEVOTHYROXINE 75 MCG TAB
75 mcg | ORAL_TABLET | Freq: Every day | ORAL | Status: DC
Start: 2015-07-08 — End: 2016-01-24

## 2015-07-08 MED ORDER — GABAPENTIN 300 MG CAP
300 mg | ORAL_CAPSULE | ORAL | Status: DC
Start: 2015-07-08 — End: 2016-10-20

## 2015-07-08 NOTE — Patient Instructions (Signed)
Dehydration: Care Instructions  Your Care Instructions  Dehydration happens when your body loses too much fluid. This might happen when you do not drink enough water or you lose large amounts of fluids from your body because of diarrhea, vomiting, or sweating. Severe dehydration can be life-threatening.  Water and minerals called electrolytes help put your body fluids back in balance. Learn the early signs of fluid loss, and drink more fluids to prevent dehydration.  Follow-up care is a key part of your treatment and safety. Be sure to make and go to all appointments, and call your doctor if you are having problems. It's also a good idea to know your test results and keep a list of the medicines you take.  How can you care for yourself at home?  ?? To prevent dehydration, drink plenty of fluids, enough so that your urine is light yellow or clear like water. Choose water and other caffeine-free clear liquids until you feel better. If you have kidney, heart, or liver disease and have to limit fluids, talk with your doctor before you increase the amount of fluids you drink.  ?? If you do not feel like eating or drinking, try taking small sips of water, sports drinks, or other rehydration drinks.  ?? Get plenty of rest.  To prevent dehydration  ?? Add more fluids to your diet and daily routine, unless your doctor has told you not to.  ?? During hot weather, drink more fluids. Drink even more fluids if you exercise a lot. Stay away from drinks with alcohol or caffeine.  ?? Watch for the symptoms of dehydration. These include:  ?? A dry, sticky mouth.  ?? Dark yellow urine, and not much of it.  ?? Dry and sunken eyes.  ?? Feeling very tired.  ?? Learn what problems can lead to dehydration. These include:  ?? Diarrhea, fever, and vomiting.  ?? Any illness with a fever, such as pneumonia or the flu.  ?? Activities that cause heavy sweating, such as endurance races and heavy outdoor work in hot or humid weather.   ?? Alcohol or drug abuse or withdrawal.  ?? Certain medicines, such as cold and allergy pills (antihistamines), diet pills (diuretics), and laxatives.  ?? Certain diseases, such as diabetes, cancer, and heart or kidney disease.  When should you call for help?  Call 911 anytime you think you may need emergency care. For example, call if:  ?? You passed out (lost consciousness).  Call your doctor now or seek immediate medical care if:  ?? You are confused and cannot think clearly.  ?? You are dizzy or lightheaded, or you feel like you may faint.  ?? You have signs of needing more fluids. You have sunken eyes and a dry mouth, and you pass only a little dark urine.  ?? You cannot keep fluids down.  Watch closely for changes in your health, and be sure to contact your doctor if:  ?? You are not making tears.  ?? Your skin is very dry and sags slowly back into place after you pinch it.  ?? Your mouth and eyes are very dry.  Where can you learn more?  Go to [employer]  Enter Q814 in the search box to learn more about "Dehydration: Care Instructions."  ?? 2006-2016 [employer]. Care instructions adapted under license by [employer] (which disclaims liability or warranty for this information). This care instruction is for use with your licensed healthcare professional. If you have questions about  a medical condition or this instruction, always ask your healthcare professional. [place] any warranty or liability for your use of this information.  Content Version: 10.9.538570; Current as of: October 26, 2014        Starting a Weight Loss Plan: Care Instructions  Your Care Instructions  If you are thinking about losing weight, it can be hard to know where to start. Your doctor can help you set up a weight loss plan that best meets your needs. You may want to take a class on nutrition or exercise, or join  a weight loss support group. If you have questions about how to make changes to your eating or exercise habits, ask your doctor about seeing a registered dietitian or an exercise specialist.  It can be a big challenge to lose weight. But you do not have to make huge changes at once. Make small changes, and stick with them. When those changes become habit, add a few more changes.  If you do not think you are ready to make changes right now, try to pick a date in the future. Make an appointment to see your doctor to discuss whether the time is right for you to start a plan.  Follow-up care is a key part of your treatment and safety. Be sure to make and go to all appointments, and call your doctor if you are having problems. It???s also a good idea to know your test results and keep a list of the medicines you take.  How can you care for yourself at home?  ?? Set realistic goals. Many people expect to lose much more weight than is likely. A weight loss of 5% to 10% of your body weight may be enough to improve your health.  ?? Get family and friends involved to provide support. Talk to them about why you are trying to lose weight, and ask them to help. They can help by participating in exercise and having meals with you, even if they may be eating something different.  ?? Find what works best for you. If you do not have time or do not like to cook, a program that offers meal replacement bars or shakes may be better for you. Or if you like to prepare meals, finding a plan that includes daily menus and recipes may be best.  ?? Ask your doctor about other health professionals who can help you achieve your weight loss goals.  ?? A dietitian can help you make healthy changes in your diet.  ?? An exercise specialist or personal trainer can help you develop a safe and effective exercise program.  ?? A counselor or psychiatrist can help you cope with issues such as  depression, anxiety, or family problems that can make it hard to focus on weight loss.  ?? Consider joining a support group for people who are trying to lose weight. Your doctor can suggest groups in your area.  Where can you learn more?  Go to [employer]  Enter U357 in the search box to learn more about "Starting a Weight Loss Plan: Care Instructions."  ?? 2006-2016 [employer]. Care instructions adapted under license by [employer] (which disclaims liability or warranty for this information). This care instruction is for use with your licensed healthcare professional. If you have questions about a medical condition or this instruction, always ask your healthcare professional. [place] any warranty or liability for your use of this information.  Content Version: 10.9.538570; Current as  of: January 22, 2015

## 2015-07-08 NOTE — Progress Notes (Signed)
Patient is in the office today with c/o back, leg pain and nausea which has resolved. No chest pain.Started on Wednesday and resolved by Thursday am.    Do you have an Advance Directive no  Do you want more information given inofrmation.    1. Have you been to the ER, urgent care clinic since your last visit?  Hospitalized since your last visit?No    2. Have you seen or consulted any other health care providers outside of [facility] since your last visit?  Include any pap smears or colon screening. No

## 2015-07-08 NOTE — Progress Notes (Signed)
Chief Complaint   Patient presents with   ??? Leg Pain   ??? Back Pain   ??? Nausea     all now resolved.         HPI:  Patient is a 64 year old obese African American female with medical problems listed below presents today for an acute visit with several complaints. She recently was out in the sun and had decreased oral intake and subsequently started feeling dizzy, nauseous, and had intermittent aches on her leg and back. Otherwise, she has been feeling well and denies CP, SOB, palpitations, vomiting, or other complaints today. She is complaint with her medications with no adverse effects reported.         Past Medical History   Diagnosis Date   ??? Iron deficiency anemia 1/61/0960   ??? 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 Hypertension    ??? Hypercholesterolemia    ??? Thyroid disease      hypothyroidism   ??? Asthma    ??? Right sided sciatica    ??? Colon polyp    ??? Depression 12/18/2011   ??? OA (osteoarthritis of spine) 02/15/2009   ??? DDD (degenerative disc disease) 02/15/2009   ??? OA (osteoarthritis of spine) 02/15/2009   ??? Sleep apnea 02/15/2009     uses cpap machine   ??? History of echocardiogram 03/20/2004     EF 70%.  Borderline DDfx.  No significant valvular pathology.   ??? Thallium stress test  03/20/2004     Partially transient, mod basal & mid anterior defect most c/w artifact; mild anterior ischemia less likely.  Neg EKG on pharm stress test.   ??? Lower extremity venous duplex 10/23/2009     Left leg:  No DVT.     ??? Anemia    ??? Shortness of breath      Possible asthma, HCVD; less likely CAD (Noted 03/15/09)   ??? Essential hypertension, benign    ??? Other and unspecified hyperlipidemia    ??? Pre-operative cardiovascular examination      For spine surgery   ??? Obesity, unspecified    ??? Meralgia paraesthetica 10/22/2013     Allergies   Allergen Reactions   ??? Asa-Acetaminophen-Caff-Potass Other (comments)     Bleeding in stomach   ??? Benadryl [Diphenhydramine Hcl] Other (comments)     Muscle jerking    ??? Other Plant, Animal, Environmental Not Reported This Time     Grass, Dust and Mold   ??? Pollen Extracts Not Reported This Time     Current Outpatient Prescriptions   Medication Sig Dispense Refill   ??? valsartan-hydrochlorothiazide (DIOVAN-HCT) 160-25 mg per tablet take 1 tablet by mouth daily 30 Tab 3   ??? levothyroxine (SYNTHROID) 75 mcg tablet Take 1 Tab by mouth Daily (before breakfast). 30 Tab 3   ??? rosuvastatin (CRESTOR) 40 mg tablet Take 1 Tab by mouth daily. 30 Tab 3   ??? gabapentin (NEURONTIN) 300 mg capsule take 1 capsule by mouth every morning and 2 capsules by mouth at bedtime 90 Cap 0   ??? omeprazole (PRILOSEC) 40 mg capsule take 1 capsule by mouth once daily BEFORE BREAKFAST 30 Cap 5   ??? montelukast (SINGULAIR) 10 mg tablet Take 1 Tab by mouth nightly as needed. Indications: asthma 90 Tab 3   ??? IRON DEXTRAN COMPLEX (IRON DEXTRAN IV) by IntraVENous route.     ??? Cholecalciferol, Vitamin D3, (VITAMIN D-3) 5,000 unit Tab Take  by mouth daily.     ??? VARICELLA-ZOSTER VACINE  LIVE 19,400 unit/0.65 mL susr injection   0   ??? gabapentin (NEURONTIN) 300 mg capsule take 1 capsule by mouth every morning and 2 capsules by mouth at bedtime 90 Cap 3   ??? traMADol (ULTRAM) 50 mg tablet Take 1 Tab by mouth every six (6) hours as needed for Pain. Max Daily Amount: 200 mg. 40 Tab 0   ??? ranitidine (ZANTAC) 150 mg tablet Take 150 mg by mouth two (2) times a day.     ??? albuterol (PROVENTIL HFA) 90 mcg/actuation inhaler Take 2 Puffs by inhalation every six (6) hours as needed for Wheezing. 1 Inhaler 5   ??? budesonide-formoterol (SYMBICORT) 160-4.5 mcg/actuation HFA inhaler Take 2 Puffs by inhalation two (2) times a day. 1 Inhaler 5          ROS:  Pertinent as in HPI        Physical Exam:  BP 138/78 mmHg   Pulse 79   Temp(Src) 98.3 ??F (36.8 ??C) (Oral)   Resp 20   Ht 5\' 2"  (1.575 m)   Wt 197 lb 12.8 oz (89.721 kg)   BMI 36.17 kg/m2   SpO2 98%  General: Obese black female, a & o x 3, afebrile, interacting  appropriately, in no acute distress  Skin: warm and dry, no rashes , no bruises  Neck: supple, symmetrical, no thyromegaly  Respiratory: symmetrical chest expansion, lung sounds clear bilaterally, good air entry, good respiratory effort, no wheezes or crackles  Cardiovascular: normal S1S2, regular rate and rhythm, no murmurs, pulses palpable, no thrill, no carotid or abdominal bruits, no peripheral edema, no JVD  Abdomen: non-distended, normoactive bowel sounds x 4 quadrants, soft, non-tender to palpation  Musculoskeletal: normal ROM on all joints, no swelling or deformity, no perilumbar tenderness, steady gait      Assessment/Plan:    ICD-10-CM ICD-9-CM    1. HTN (hypertension), benign I10 401.1 Controlled  Continue current meds and he was counseled on low salt diet.  valsartan-hydrochlorothiazide (DIOVAN-HCT) 160-25 mg per tablet   2. 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 Hypercholesterolemia E78.0 272.0 Continue rosuvastatin (CRESTOR) 40 mg daily and she was counseled on low fat diet.   4. Gastroesophageal reflux disease without esophagitis K21.9 530.81 omeprazole (PRILOSEC) 40 mg capsule      ranitidine (ZANTAC) 150 mg tablet   5. Dehydration E86.0 276.51 She was advised on liberal fluid intake especially when out on the sun.   6. Obesity (BMI 30-39.9) E66.9 278.00 Discussed the patient's above normal BMI with her.  I have recommended the following interventions: dietary management education, guidance, and counseling  encourage exercise  lifestyle education regarding diet .  The BMI follow up plan is as follows: BMI is out of normal parameters and plan is as follows: I have counseled this patient on diet and exercise regimens   7. Need for hepatitis C screening test Z11.59 V73.89 HEPATITIS C AB         Orders Placed This Encounter   ??? HEPATITIS C AB     Standing Status:   Future     Standing Expiration Date:   07/08/2016    ??? VARICELLA-ZOSTER VACINE LIVE 19,400 unit/0.65 mL susr injection     Refill:  0   ??? valsartan-hydrochlorothiazide (DIOVAN-HCT) 160-25 mg per tablet     Sig: take 1 tablet by mouth daily     Dispense:  90 Tab     Refill:  1   ???  levothyroxine (SYNTHROID) 75 mcg tablet     Sig: Take 1 Tab by mouth Daily (before breakfast).     Dispense:  90 Tab     Refill:  1   ??? rosuvastatin (CRESTOR) 40 mg tablet     Sig: Take 1 Tab by mouth daily.     Dispense:  90 Tab     Refill:  1   ??? gabapentin (NEURONTIN) 300 mg capsule     Sig: take 1 capsule by mouth every morning and 2 capsules by mouth at bedtime     Dispense:  180 Cap     Refill:  1   ??? omeprazole (PRILOSEC) 40 mg capsule     Sig: take 1 capsule by mouth once daily BEFORE BREAKFAST     Dispense:  90 Cap     Refill:  1   ??? montelukast (SINGULAIR) 10 mg tablet     Sig: Take 1 Tab by mouth nightly as needed. Indications: asthma     Dispense:  90 Tab     Refill:  1   ??? ranitidine (ZANTAC) 150 mg tablet     Sig: Take 1 Tab by mouth two (2) times a day.     Dispense:  180 Tab     Refill:  1   ??? albuterol (PROVENTIL HFA) 90 mcg/actuation inhaler     Sig: Take 2 Puffs by inhalation every six (6) hours as needed for Wheezing.     Dispense:  1 Inhaler     Refill:  6     Additional Notes: Discussed today's diagnosis, treatment plans. Discussed medication indications and side effects.    Weight Management:Counseled  After Visit Summary: Discussed provided printed patient instructions. Answered questions accordingly.  Follow-up Disposition: As previously scheduled          Osie Cheeks, DO, MPH  Internal Medicine

## 2015-07-09 ENCOUNTER — Encounter

## 2015-07-09 ENCOUNTER — Ambulatory Visit
Admit: 2015-07-09 | Discharge: 2015-07-09 | Payer: PRIVATE HEALTH INSURANCE | Attending: Neurology | Primary: Internal Medicine

## 2015-07-09 DIAGNOSIS — G43009 Migraine without aura, not intractable, without status migrainosus: Secondary | ICD-10-CM

## 2015-07-09 MED ORDER — TOPIRAMATE 25 MG TAB
25 mg | ORAL_TABLET | Freq: Two times a day (BID) | ORAL | Status: DC
Start: 2015-07-09 — End: 2016-07-03

## 2015-07-09 NOTE — Communication Body (Signed)
Elaine [name] is a 64 y.o., right handed female, with an established history of hypertension, known history of significant Elaine [name] Chiari malformation who comes in complaining of worsening gait abnormalities.  She states that she's been noticing a gradual worsening of her gait that has been progressing over months.  she'll have good days and bad days.   There has not been a significant change in her head and neck pain.   She typically has a headache that occurs 2-3 times per week, usually in the frontal regions.  She doesn't suffer from neck pain.  Originally she was seen by neurology in 2010 for a gait disorder.  Eventually she got to see Dr Carney Corners, a neurosurgeon who ordered the MRI of both the brain and cervical spinal cord.  She was offered surgery but declined.  She continued to have intermittent stumbling for the subsequent 6 years.  She does continue to suffer from headaches, that has never been treated.  Her typical headaches range from a 6/10 up to a 9/10.  Of note is that her mother had chronic headaches.    Social History; lives with her son, separated.  Doesn't drink, smoke nor use illicit drugs.  She's a Education officer, museum.      Family History; mother had headaches.  Died from lung cancer.  Father died from trauma.  Sister passed from heart disease.  Other 2 siblings with diabetes and hypertension.      Current Outpatient Prescriptions   Medication Sig Dispense Refill   ??? VARICELLA-ZOSTER VACINE LIVE 19,400 unit/0.65 mL susr injection   0   ??? valsartan-hydrochlorothiazide (DIOVAN-HCT) 160-25 mg per tablet take 1 tablet by mouth daily 90 Tab 1   ??? levothyroxine (SYNTHROID) 75 mcg tablet Take 1 Tab by mouth Daily (before breakfast). 90 Tab 1   ??? rosuvastatin (CRESTOR) 40 mg tablet Take 1 Tab by mouth daily. 90 Tab 1   ??? gabapentin (NEURONTIN) 300 mg capsule take 1 capsule by mouth every morning and 2 capsules by mouth at bedtime 180 Cap 1    ??? omeprazole (PRILOSEC) 40 mg capsule take 1 capsule by mouth once daily BEFORE BREAKFAST 90 Cap 1   ??? montelukast (SINGULAIR) 10 mg tablet Take 1 Tab by mouth nightly as needed. Indications: asthma 90 Tab 1   ??? ranitidine (ZANTAC) 150 mg tablet Take 1 Tab by mouth two (2) times a day. 180 Tab 1   ??? albuterol (PROVENTIL HFA) 90 mcg/actuation inhaler Take 2 Puffs by inhalation every six (6) hours as needed for Wheezing. 1 Inhaler 6   ??? gabapentin (NEURONTIN) 300 mg capsule take 1 capsule by mouth every morning and 2 capsules by mouth at bedtime 90 Cap 3   ??? IRON DEXTRAN COMPLEX (IRON DEXTRAN IV) by IntraVENous route.     ??? Cholecalciferol, Vitamin D3, (VITAMIN D-3) 5,000 unit Tab Take  by mouth daily.     ??? traMADol (ULTRAM) 50 mg tablet Take 1 Tab by mouth every six (6) hours as needed for Pain. Max Daily Amount: 200 mg. 40 Tab 0   ??? budesonide-formoterol (SYMBICORT) 160-4.5 mcg/actuation HFA inhaler Take 2 Puffs by inhalation two (2) times a day. 1 Inhaler 5       Past Medical History   Diagnosis Date   ??? Iron deficiency anemia 3/53/2992   ??? Lichen planus 04/01/8340   ??? Hypertension    ??? Hypercholesterolemia    ??? Thyroid disease      hypothyroidism   ??? Asthma    ???  Right sided sciatica    ??? Colon polyp    ??? Depression 12/18/2011   ??? OA (osteoarthritis of spine) 02/15/2009   ??? DDD (degenerative disc disease) 02/15/2009   ??? OA (osteoarthritis of spine) 02/15/2009   ??? Sleep apnea 02/15/2009     uses cpap machine   ??? History of echocardiogram 03/20/2004     EF 70%.  Borderline DDfx.  No significant valvular pathology.   ??? Thallium stress test  03/20/2004     Partially transient, mod basal & mid anterior defect most c/w artifact; mild anterior ischemia less likely.  Neg EKG on pharm stress test.   ??? Lower extremity venous duplex 10/23/2009     Left leg:  No DVT.     ??? Anemia    ??? Shortness of breath      Possible asthma, HCVD; less likely CAD (Noted 03/15/09)   ??? Essential hypertension, benign     ??? Other and unspecified hyperlipidemia    ??? Pre-operative cardiovascular examination      For spine surgery   ??? Obesity, unspecified    ??? Meralgia paraesthetica 10/22/2013       Past Surgical History   Procedure Laterality Date   ??? Pr neurological procedure unlisted  03-16-2009     s/p ACD & Fusion (Dr.P.Gurtner)   ??? Pr colonoscopy,diagnostic  02-2007     +polyp(tubular adenoma); Dr Angelique Holm   ??? Hx tubal ligation     ??? Hx hysterectomy     ??? Hx hernia repair       3x   ??? Hx colonoscopy  02-24-12     normal (Dr. Angelique Holm)       Allergies   Allergen Reactions   ??? Asa-Acetaminophen-Caff-Potass Other (comments)     Bleeding in stomach   ??? Benadryl [Diphenhydramine Hcl] Other (comments)     Muscle jerking   ??? Other Plant, Animal, Environmental Not Reported This Time     Grass, Dust and Mold   ??? Pollen Extracts Not Reported This Time       Patient Active Problem List   Diagnosis Code   ??? DOE (Dyspnea on Exertion) R06.09   ??? Hypothyroidism E03.9   ??? Hypercholesterolemia E78.0   ??? HTN (Hypertension), Benign I10   ??? GERD (Gastroesophageal Reflux Disease) K21.9   ??? Asthma J45.909   ??? Vitamin D Deficiency E55.9   ??? Iron Deficiency Anemia D50.9   ??? Submental lymphadenopathy R59.0   ??? Sciatica M54.30   ??? Depression F32.9   ??? Meralgia paraesthetica G57.10   ??? Obesity (BMI 30-39.9) E66.9   ??? Pre-diabetes R73.09   ??? Abnormal stress test R94.39   ??? Need for hepatitis C screening test Z11.59   ??? Encounter for screening mammogram for breast cancer Z12.31   ??? Left knee pain M25.562   ??? Swelling of left lower extremity M79.89   ??? Osteoarthritis of left knee M17.9   ??? Post-menopausal Z78.0   ??? 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 Dehydration E86.0         Review of Systems: she has chronic low back pain for the past 10 years.  She's suffered from sciatica down the right leg.   As above otherwise 11 point review of systems negative including;   Constitutional no fever or chills  Skin denies rash or itching  HENT  Denies tinnitus, hearing lose   Eyes denies diplopia vision lose  Respiratory denies shortness of breath  Cardiovascular denies chest pain, dyspnea  on exertion  Gastrointestinal denies nausea, vomiting, diarrhea, constipation  Genitourinary denies incontinence  Musculoskeletal denies joint pain or swelling  Endocrine denies weight change  Hematology denies easy bruising or bleeding   Neurological as above in HPI      PHYSICAL EXAMINATION:      VITAL SIGNS:  BP 144/75 mmHg   Pulse 73   Temp(Src) 98.5 ??F (36.9 ??C) (Oral)   Resp 16   Ht 5' 4" (1.626 m)   Wt 91.445 kg (201 lb 9.6 oz)   BMI 34.59 kg/m2   SpO2 95%    GENERAL: The patient is well developed, well nourished, and in no apparent distress.   EXTREMITIES: No clubbing, cyanosis, or edema is identified.  Pulses 2+ and symmetrical.  Muscle tone is normal.  HEAD:   Ear, nose, and throat appear to be without trauma.  The patient is normocephalic.    NEUROLOGIC EXAMINATION    MENTAL STATUS: The patient is awake, alert, and oriented x 4.  Fund of knowledge is adequate.  Speech is fluent and memory appears to be intact, both long and short term.    CRANIAL NERVES: II ??? Visual fields are full to confrontation. Funduscopic examination reveals flat disks bilaterally. Pupils are both 4 mm and briskly reactive to light and accommodation.   III, IV, VI ??? Extraocular movements are intact and there is no nystagmus.   V ??? Facial sensation is intact to pinprick and light touch.  VII ??? Face is symmetrical.   VIII - Hearing is present.   IX, X, XII??? Palate rises symmetrically. Gag is present. Tongue is in the midline.      XI - Shoulder shrugging and head turning intact  MOTOR:  The patient is 5/5 in all four limbs without any drift. Fine finger movements are symmetrical.  Isolated motor group testing reveals no focal abnormalities.  Tone is normal.  Sensory examination is intact to pinprick, light touch and position sense testing.  Reflexes are 2+ and  symmetrical. Plantars are down going. Cerebellar examination reveals no gross ataxia or dysmetria. Gait is normal and the patient can't tandem walk.     MR CERVICAL SPINE W/WO CONTRAST3/31/2010   [place]   Result Transcription   Donnal, Fulton Mole, MD - Wed Mar 06, 2009 11:52 AM EDT     --------------------------------------------------   Impression:     1. 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 not clearly enhancing to specifically indicate acute. Recommend close interval follow up particularly if patient symptoms are progressive (is relevant imaging available to establish chronicity?)     3. 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 Nonspecific    -consider unusual disc sequestration, epidural hematoma/tumor/phlegmon (looks like vascularized tissue with enhancement)     4. Advanced changes DDD produce spinal stenosis other levels as well.    -DJD narrows some foramen     Discussed with Dr. Dia Sitter. Confirmed.     --------------------------------------------------   Diagnostic Interpretation:     MR CERVICAL SPINE WITHOUT AND WITH CONTRAST   HISTORY: Severe headache. Chiari I malformation.   COMPARISON: None   TECHNIQUE: Cervical spine scanned with axial and sagittal T2W scans and with sagittal T1W scans. Post gadolinium (20 ml  Magnevist injected intravenously) axial and sagittal T1W scans then obtained.      FINDINGS:    Mildly motion degraded scan with some loss of detail.      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    -No tonsillar gliosis identified.      Cord disease present     - definite 6 mm rounded increased T2 signal lesion in right cord upper C3 level.    - Suggestion of a more extensive zone of poorly defined increased T2 signal approximately 1.6 cm C2-C3.    -no other definite cord lesions cord not well evaluated.     Grade I antero-listhesis C4 on C5. Grade I retrolisthesis C6 on C7. some discs are mildly decreased in height. May have some reactive marrow signal adjoining some discs, not evaluated     C2/C3: canal and foramen patent   C3 level: Canal patent   C3/C4: right foramen patent. Left foramen mildly narrowed by hypertrophic DJD. Small disc protrusion/extrusion at disc level proper causes mild spinal stenosis. Annular tear.   C4 level: 4 mm A/P x 7 mm transverse diameter pathologic increased T2/decrease T1 signal/enhancing signal ventral epidural material impresses cord and causes moderate stenosis. This is unusual signal for diskal extrusion/ sequestration and nonacute epidural hematoma or phlegmon or tumor is possible.   C4/C5: caudal margin of above described pathologic ventral epidural material combines with disc extrusion with annular tear to produce moderate spinal stenosis. Mild to moderate degenerative narrowing right foramen and left foramen   C5 level: Thickening PLL produces mild spinal stenosis   C5/C6: mild-to-moderate degenerative narrowing right foramen and mild degenerative narrowing left foramen. Disc protrusion with annular tear is slightly worse towards the left canal and produces mild spinal stenosis   C6 level: Thickening PLL. No stenosis.   C6/C7: foramen patent. Disc protrusion with annular tear. Mild spinal stenosis   C7 level: Canal is patent   C7/T1: right foramen may be moderately narrowed by degenerative change; left foramen may be mildly narrowed. Minimal disc protrusion. No spinal stenosis     _     Dictated by: Hosie Poisson, M.D., Fulton Mole (Ph: [phone], Pgr: [phone]) [facility]   Transcribed on: 06-Mar-2009 11:52    Finalized on: 06-Mar-2009 12:51   Finalized by: Hosie Poisson, M.D., Fulton Mole (Ph: [phone], Pgr: [phone]) [facility]      ** FINAL REPORT **         I have reviewed the above imagines myself.       CBC:   Lab Results   Component Value Date/Time    WBC 5.5 04/27/2015 12:00 AM    RBC 4.40 04/27/2015 12:00 AM    HGB 10.8 04/27/2015 12:00 AM    HCT 33.4 04/27/2015 12:00 AM    PLATELET 289 04/27/2015 12:00 AM     BMP:   Lab Results   Component Value Date/Time    GLUCOSE 96 04/27/2015 12:00 AM    SODIUM 143 04/27/2015 12:00 AM    POTASSIUM 3.8 04/27/2015 12:00 AM    CHLORIDE 99 04/27/2015 12:00 AM    CO2 28 04/27/2015 12:00 AM    BUN 14 04/27/2015 12:00 AM    CREATININE 0.71 04/27/2015 12:00 AM    CALCIUM 9.7 04/27/2015 12:00 AM     CMP:   Lab Results   Component Value Date/Time    GLUCOSE 96 04/27/2015 12:00 AM    SODIUM 143 04/27/2015  12:00 AM    POTASSIUM 3.8 04/27/2015 12:00 AM    CHLORIDE 99 04/27/2015 12:00 AM    CO2 28 04/27/2015 12:00 AM    BUN 14 04/27/2015 12:00 AM    CREATININE 0.71 04/27/2015 12:00 AM    CALCIUM 9.7 04/27/2015 12:00 AM    ANION GAP 5 07/09/2014 12:00 PM    BUN/CREATININE RATIO 20 04/27/2015 12:00 AM    ALK. PHOSPHATASE 69 04/27/2015 12:00 AM    PROTEIN, TOTAL 6.9 04/27/2015 12:00 AM    ALBUMIN 4.2 04/27/2015 12:00 AM    GLOBULIN 4.1 07/09/2014 12:00 PM    A-G RATIO 1.6 04/27/2015 12:00 AM     Coagulation:   Lab Results   Component Value Date/Time    INR, EXTERNAL 1.0 10/09/2014          Impression: Compliant of gait instability in this patient who has risk factors including established Arnold Chiari malformation and an abnormal signal in the upper cervical spinal cord in the region of the malformation.  She had significant pathology back in 2010 which warranted the suggestion that surgery might be helpful, but declined at that time.  Her current complaints are very suggestive of problems with the upper cervical spinal cord, but her examination doesn't support this withher  having normal reflexes throughout and no overt signs of weakness.  Her picture is a bit confusing.     She also has chronic headaches which have never been addressed.  She has greater than 3 headaches per week which would warrant treatment with prophylactic medications.     Plan: She needs to have a repeat follow up MRI of the cervical cord, with and without contrast to evaluate the old lesion.  Further suggestions after the scan.     For the headaches i'm going to place this patient on low dose Topamax to start and see how that works.  Return here in about 3 weeks.

## 2015-07-09 NOTE — Progress Notes (Signed)
Elaine [name] is a 64 y.o., right handed female, with an established history of hypertension, known history of significant Arnold Chiari malformation who comes in complaining of worsening gait abnormalities.  She states that she's been noticing a gradual worsening of her gait that has been progressing over months.  she'll have good days and bad days.   There has not been a significant change in her head and neck pain.   She typically has a headache that occurs 2-3 times per week, usually in the frontal regions.  She doesn't suffer from neck pain.  Originally she was seen by neurology in 2010 for a gait disorder.  Eventually she got to see Dr Gurtner, a neurosurgeon who ordered the MRI of both the brain and cervical spinal cord.  She was offered surgery but declined.  She continued to have intermittent stumbling for the subsequent 6 years.  She does continue to suffer from headaches, that has never been treated.  Her typical headaches range from a 6/10 up to a 9/10.  Of note is that her mother had chronic headaches.    Social History; lives with her son, separated.  Doesn't drink, smoke nor use illicit drugs.  She's a school teacher.      Family History; mother had headaches.  Died from lung cancer.  Father died from trauma.  Sister passed from heart disease.  Other 2 siblings with diabetes and hypertension.      Current Outpatient Prescriptions   Medication Sig Dispense Refill   ??? VARICELLA-ZOSTER VACINE LIVE 19,400 unit/0.65 mL susr injection   0   ??? valsartan-hydrochlorothiazide (DIOVAN-HCT) 160-25 mg per tablet take 1 tablet by mouth daily 90 Tab 1   ??? levothyroxine (SYNTHROID) 75 mcg tablet Take 1 Tab by mouth Daily (before breakfast). 90 Tab 1   ??? rosuvastatin (CRESTOR) 40 mg tablet Take 1 Tab by mouth daily. 90 Tab 1   ??? gabapentin (NEURONTIN) 300 mg capsule take 1 capsule by mouth every morning and 2 capsules by mouth at bedtime 180 Cap 1    ??? omeprazole (PRILOSEC) 40 mg capsule take 1 capsule by mouth once daily BEFORE BREAKFAST 90 Cap 1   ??? montelukast (SINGULAIR) 10 mg tablet Take 1 Tab by mouth nightly as needed. Indications: asthma 90 Tab 1   ??? ranitidine (ZANTAC) 150 mg tablet Take 1 Tab by mouth two (2) times a day. 180 Tab 1   ??? albuterol (PROVENTIL HFA) 90 mcg/actuation inhaler Take 2 Puffs by inhalation every six (6) hours as needed for Wheezing. 1 Inhaler 6   ??? gabapentin (NEURONTIN) 300 mg capsule take 1 capsule by mouth every morning and 2 capsules by mouth at bedtime 90 Cap 3   ??? IRON DEXTRAN COMPLEX (IRON DEXTRAN IV) by IntraVENous route.     ??? Cholecalciferol, Vitamin D3, (VITAMIN D-3) 5,000 unit Tab Take  by mouth daily.     ??? traMADol (ULTRAM) 50 mg tablet Take 1 Tab by mouth every six (6) hours as needed for Pain. Max Daily Amount: 200 mg. 40 Tab 0   ??? budesonide-formoterol (SYMBICORT) 160-4.5 mcg/actuation HFA inhaler Take 2 Puffs by inhalation two (2) times a day. 1 Inhaler 5       Past Medical History   Diagnosis Date   ??? Iron deficiency anemia 02/15/2009   ??? 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 Hypertension    ??? Hypercholesterolemia    ??? Thyroid disease      hypothyroidism   ??? Asthma    ???   Right sided sciatica    ??? Colon polyp    ??? Depression 12/18/2011   ??? OA (osteoarthritis of spine) 02/15/2009   ??? DDD (degenerative disc disease) 02/15/2009   ??? OA (osteoarthritis of spine) 02/15/2009   ??? Sleep apnea 02/15/2009     uses cpap machine   ??? History of echocardiogram 03/20/2004     EF 70%.  Borderline DDfx.  No significant valvular pathology.   ??? Thallium stress test  03/20/2004     Partially transient, mod basal & mid anterior defect most c/w artifact; mild anterior ischemia less likely.  Neg EKG on pharm stress test.   ??? Lower extremity venous duplex 10/23/2009     Left leg:  No DVT.     ??? Anemia    ??? Shortness of breath      Possible asthma, HCVD; less likely CAD (Noted 03/15/09)   ??? Essential hypertension, benign     ??? Other and unspecified hyperlipidemia    ??? Pre-operative cardiovascular examination      For spine surgery   ??? Obesity, unspecified    ??? Meralgia paraesthetica 10/22/2013       Past Surgical History   Procedure Laterality Date   ??? Pr neurological procedure unlisted  03-16-2009     s/p ACD & Fusion (Dr.P.Gurtner)   ??? Pr colonoscopy,diagnostic  02-2007     +polyp(tubular adenoma); Dr Gamsey   ??? Hx tubal ligation     ??? Hx hysterectomy     ??? Hx hernia repair       3x   ??? Hx colonoscopy  02-24-12     normal (Dr. Gamsey)       Allergies   Allergen Reactions   ??? Asa-Acetaminophen-Caff-Potass Other (comments)     Bleeding in stomach   ??? Benadryl [Diphenhydramine Hcl] Other (comments)     Muscle jerking   ??? Other Plant, Animal, Environmental Not Reported This Time     Grass, Dust and Mold   ??? Pollen Extracts Not Reported This Time       Patient Active Problem List   Diagnosis Code   ??? DOE (Dyspnea on Exertion) R06.09   ??? Hypothyroidism E03.9   ??? Hypercholesterolemia E78.0   ??? HTN (Hypertension), Benign I10   ??? GERD (Gastroesophageal Reflux Disease) K21.9   ??? Asthma J45.909   ??? Vitamin D Deficiency E55.9   ??? Iron Deficiency Anemia D50.9   ??? Submental lymphadenopathy R59.0   ??? Sciatica M54.30   ??? Depression F32.9   ??? Meralgia paraesthetica G57.10   ??? Obesity (BMI 30-39.9) E66.9   ??? Pre-diabetes R73.09   ??? Abnormal stress test R94.39   ??? Need for hepatitis C screening test Z11.59   ??? Encounter for screening mammogram for breast cancer Z12.31   ??? Left knee pain M25.562   ??? Swelling of left lower extremity M79.89   ??? Osteoarthritis of left knee M17.9   ??? Post-menopausal Z78.0   ??? 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 Dehydration E86.0         Review of Systems: she has chronic low back pain for the past 10 years.  She's suffered from sciatica down the right leg.   As above otherwise 11 point review of systems negative including;   Constitutional no fever or chills  Skin denies rash or itching  HENT  Denies tinnitus, hearing lose   Eyes denies diplopia vision lose  Respiratory denies shortness of breath  Cardiovascular denies chest pain, dyspnea   on exertion  Gastrointestinal denies nausea, vomiting, diarrhea, constipation  Genitourinary denies incontinence  Musculoskeletal denies joint pain or swelling  Endocrine denies weight change  Hematology denies easy bruising or bleeding   Neurological as above in HPI      PHYSICAL EXAMINATION:      VITAL SIGNS:  BP 144/75 mmHg   Pulse 73   Temp(Src) 98.5 ??F (36.9 ??C) (Oral)   Resp 16   Ht 5' 4" (1.626 m)   Wt 91.445 kg (201 lb 9.6 oz)   BMI 34.59 kg/m2   SpO2 95%    GENERAL: The patient is well developed, well nourished, and in no apparent distress.   EXTREMITIES: No clubbing, cyanosis, or edema is identified.  Pulses 2+ and symmetrical.  Muscle tone is normal.  HEAD:   Ear, nose, and throat appear to be without trauma.  The patient is normocephalic.    NEUROLOGIC EXAMINATION    MENTAL STATUS: The patient is awake, alert, and oriented x 4.  Fund of knowledge is adequate.  Speech is fluent and memory appears to be intact, both long and short term.    CRANIAL NERVES: II ??? Visual fields are full to confrontation. Funduscopic examination reveals flat disks bilaterally. Pupils are both 4 mm and briskly reactive to light and accommodation.   III, IV, VI ??? Extraocular movements are intact and there is no nystagmus.   V ??? Facial sensation is intact to pinprick and light touch.  VII ??? Face is symmetrical.   VIII - Hearing is present.   IX, X, XII??? Palate rises symmetrically. Gag is present. Tongue is in the midline.      XI - Shoulder shrugging and head turning intact  MOTOR:  The patient is 5/5 in all four limbs without any drift. Fine finger movements are symmetrical.  Isolated motor group testing reveals no focal abnormalities.  Tone is normal.  Sensory examination is intact to pinprick, light touch and position sense testing.  Reflexes are 2+ and  symmetrical. Plantars are down going. Cerebellar examination reveals no gross ataxia or dysmetria. Gait is normal and the patient can't tandem walk.     MR CERVICAL SPINE W/WO CONTRAST3/31/2010   [facility]   Result Transcription   Donnal, John F, MD - Wed Mar 06, 2009 11:52 AM EDT     --------------------------------------------------   Impression:     1. 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 not clearly enhancing to specifically indicate acute. Recommend close interval follow up particularly if patient symptoms are progressive (is relevant imaging available to establish chronicity?)     3. 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 Nonspecific    -consider unusual disc sequestration, epidural hematoma/tumor/phlegmon (looks like vascularized tissue with enhancement)     4. Advanced changes DDD produce spinal stenosis other levels as well.    -DJD narrows some foramen     Discussed with Dr. Rodrigue. Confirmed.     --------------------------------------------------   Diagnostic Interpretation:     MR CERVICAL SPINE WITHOUT AND WITH CONTRAST   HISTORY: Severe headache. Chiari I malformation.   COMPARISON: None   TECHNIQUE: Cervical spine scanned with axial and sagittal T2W scans and with sagittal T1W scans. Post gadolinium (20 ml   Magnevist injected intravenously) axial and sagittal T1W scans then obtained.      FINDINGS:    Mildly motion degraded scan with some loss of detail.      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    -No tonsillar gliosis identified.      Cord disease present     - definite 6 mm rounded increased T2 signal lesion in right cord upper C3 level.    - Suggestion of a more extensive zone of poorly defined increased T2 signal approximately 1.6 cm C2-C3.    -no other definite cord lesions cord not well evaluated.     Grade I antero-listhesis C4 on C5. Grade I retrolisthesis C6 on C7. some discs are mildly decreased in height. May have some reactive marrow signal adjoining some discs, not evaluated     C2/C3: canal and foramen patent   C3 level: Canal patent   C3/C4: right foramen patent. Left foramen mildly narrowed by hypertrophic DJD. Small disc protrusion/extrusion at disc level proper causes mild spinal stenosis. Annular tear.   C4 level: 4 mm A/P x 7 mm transverse diameter pathologic increased T2/decrease T1 signal/enhancing signal ventral epidural material impresses cord and causes moderate stenosis. This is unusual signal for diskal extrusion/ sequestration and nonacute epidural hematoma or phlegmon or tumor is possible.   C4/C5: caudal margin of above described pathologic ventral epidural material combines with disc extrusion with annular tear to produce moderate spinal stenosis. Mild to moderate degenerative narrowing right foramen and left foramen   C5 level: Thickening PLL produces mild spinal stenosis   C5/C6: mild-to-moderate degenerative narrowing right foramen and mild degenerative narrowing left foramen. Disc protrusion with annular tear is slightly worse towards the left canal and produces mild spinal stenosis   C6 level: Thickening PLL. No stenosis.   C6/C7: foramen patent. Disc protrusion with annular tear. Mild spinal stenosis   C7 level: Canal is patent   C7/T1: right foramen may be moderately narrowed by degenerative change; left foramen may be mildly narrowed. Minimal disc protrusion. No spinal stenosis     _     Dictated by: Donnal, M.D., John F (Ph: [phone], Pgr: [phone]) [facility]   Transcribed on: 06-Mar-2009 11:52    Finalized on: 06-Mar-2009 12:51   Finalized by: Donnal, M.D., John F (Ph: [phone], Pgr: [phone]) [facility]      ** FINAL REPORT **         I have reviewed the above imagines myself.       CBC:   Lab Results   Component Value Date/Time    WBC 5.5 04/27/2015 12:00 AM    RBC 4.40 04/27/2015 12:00 AM    HGB 10.8 04/27/2015 12:00 AM    HCT 33.4 04/27/2015 12:00 AM    PLATELET 289 04/27/2015 12:00 AM     BMP:   Lab Results   Component Value Date/Time    GLUCOSE 96 04/27/2015 12:00 AM    SODIUM 143 04/27/2015 12:00 AM    POTASSIUM 3.8 04/27/2015 12:00 AM    CHLORIDE 99 04/27/2015 12:00 AM    CO2 28 04/27/2015 12:00 AM    BUN 14 04/27/2015 12:00 AM    CREATININE 0.71 04/27/2015 12:00 AM    CALCIUM 9.7 04/27/2015 12:00 AM     CMP:   Lab Results   Component Value Date/Time    GLUCOSE 96 04/27/2015 12:00 AM    SODIUM 143 04/27/2015   12:00 AM    POTASSIUM 3.8 04/27/2015 12:00 AM    CHLORIDE 99 04/27/2015 12:00 AM    CO2 28 04/27/2015 12:00 AM    BUN 14 04/27/2015 12:00 AM    CREATININE 0.71 04/27/2015 12:00 AM    CALCIUM 9.7 04/27/2015 12:00 AM    ANION GAP 5 07/09/2014 12:00 PM    BUN/CREATININE RATIO 20 04/27/2015 12:00 AM    ALK. PHOSPHATASE 69 04/27/2015 12:00 AM    PROTEIN, TOTAL 6.9 04/27/2015 12:00 AM    ALBUMIN 4.2 04/27/2015 12:00 AM    GLOBULIN 4.1 07/09/2014 12:00 PM    A-G RATIO 1.6 04/27/2015 12:00 AM     Coagulation:   Lab Results   Component Value Date/Time    INR, EXTERNAL 1.0 10/09/2014          Impression: Compliant of gait instability in this patient who has risk factors including established Arnold Chiari malformation and an abnormal signal in the upper cervical spinal cord in the region of the malformation.  She had significant pathology back in 2010 which warranted the suggestion that surgery might be helpful, but declined at that time.  Her current complaints are very suggestive of problems with the upper cervical spinal cord, but her examination doesn't support this withher  having normal reflexes throughout and no overt signs of weakness.  Her picture is a bit confusing.     She also has chronic headaches which have never been addressed.  She has greater than 3 headaches per week which would warrant treatment with prophylactic medications.     Plan: She needs to have a repeat follow up MRI of the cervical cord, with and without contrast to evaluate the old lesion.  Further suggestions after the scan.     For the headaches i'm going to place this patient on low dose Topamax to start and see how that works.  Return here in about 3 weeks.

## 2015-07-15 ENCOUNTER — Inpatient Hospital Stay: Payer: BLUE CROSS/BLUE SHIELD | Attending: Neurology | Primary: Internal Medicine

## 2015-07-15 DIAGNOSIS — G935 Compression of brain: Secondary | ICD-10-CM

## 2015-07-15 NOTE — Progress Notes (Signed)
Patient called office back and was informed that she had an appointment scheduled for today at 8:30pm for the MRI at [facility]. Patient opted to keep this appt and stated to disregard faxing order to [facility].

## 2015-07-15 NOTE — Progress Notes (Signed)
Patient called and requested that order for MRI of Spine be faxed to [facility], states her insurance has already approved the MRI.  Patient was asked to have insurance send [place] a fax of the approval.   Order faxed per patient request[place]  [address]  [phone]

## 2015-07-16 ENCOUNTER — Encounter: Attending: Neurology | Primary: Internal Medicine

## 2015-07-16 NOTE — Telephone Encounter (Signed)
Patient is aware order is in computer and order states screening mammogram.  Patient verbalizes understanding.

## 2015-07-16 NOTE — Telephone Encounter (Signed)
Patient called in and stated that she would like to have a mammogram order placed but want to make sure that the order states preventative mammogram. Please advise.

## 2015-07-17 ENCOUNTER — Telehealth

## 2015-07-17 MED ORDER — ALPRAZOLAM 0.5 MG TAB
0.5 mg | ORAL_TABLET | ORAL | Status: DC
Start: 2015-07-17 — End: 2016-07-03

## 2015-07-17 NOTE — Telephone Encounter (Signed)
This patient needs Xanax for her MRI scan.

## 2015-07-17 NOTE — Progress Notes (Signed)
LVM to pt printed script ready for pick up

## 2015-07-17 NOTE — Telephone Encounter (Signed)
Patient states she was unable to complete MRI due to anxiety. States she got very sick. She was told by the tech to call Dr. Gretchen Portela and see if he would prescribe a sedative for her. Patient will reschedule MRI if request is granted. Please advise.

## 2015-07-25 NOTE — Telephone Encounter (Signed)
Report of cancelled appts on your desk for review.

## 2015-07-27 ENCOUNTER — Inpatient Hospital Stay: Admit: 2015-07-27 | Payer: BLUE CROSS/BLUE SHIELD | Attending: Neurology | Primary: Internal Medicine

## 2015-07-27 DIAGNOSIS — G935 Compression of brain: Secondary | ICD-10-CM

## 2015-07-27 LAB — CREATININE, POC
Creatinine, POC: 1 MG/DL (ref 0.6–1.3)
GFRAA, POC: >60 mL/min/{1.73_m2} (ref >60)
GFRNA, POC: 56 mL/min/{1.73_m2} — ABNORMAL LOW (ref >60)

## 2015-07-27 MED ORDER — GADOBUTROL 10 MMOL/10 ML (1 MMOL/ML) IV
10 mmol/ mL (1 mmol/mL) | Freq: Once | INTRAVENOUS | Status: AC
Start: 2015-07-27 — End: 2015-07-27
  Administered 2015-07-27: 14:00:00 via INTRAVENOUS

## 2015-07-27 MED FILL — GADAVIST 10 MMOL/10 ML (1 MMOL/ML) INTRAVENOUS SOLUTION: 10 mmol/ mL (1 mmol/mL) | INTRAVENOUS | Qty: 10

## 2015-07-28 ENCOUNTER — Encounter

## 2015-07-29 ENCOUNTER — Ambulatory Visit: Primary: Internal Medicine

## 2015-07-30 ENCOUNTER — Ambulatory Visit
Admit: 2015-07-30 | Discharge: 2015-07-30 | Payer: PRIVATE HEALTH INSURANCE | Attending: Neurology | Primary: Internal Medicine

## 2015-07-30 DIAGNOSIS — G43009 Migraine without aura, not intractable, without status migrainosus: Secondary | ICD-10-CM

## 2015-07-30 NOTE — Communication Body (Signed)
Re:  Elaine [name],Follow up visit     07/30/2015 4:24 PM    SSN: [id number]    Subjective:   Elaine [name] returns for follow up of her headaches.  She's not taken the Topamax because of fear of side effects.  She feels like the headaches are somewhat better, she hasn't had any for the last several weeks.  There's no neck pain.  She was able to tolerate the MRI scan with a little Xanax.     Medications:    Current Outpatient Prescriptions   Medication Sig Dispense Refill   ??? ALPRAZolam (XANAX) 0.5 mg tablet 1 tab PO BID starting 2 days prior to MRI scan  Indications: ANXIETY 6 Tab 0   ??? topiramate (TOPAMAX) 25 mg tablet Take 1 Tab by mouth two (2) times a day. 60 Tab 2   ??? VARICELLA-ZOSTER VACINE LIVE 19,400 unit/0.65 mL susr injection   0   ??? valsartan-hydrochlorothiazide (DIOVAN-HCT) 160-25 mg per tablet take 1 tablet by mouth daily 90 Tab 1   ??? levothyroxine (SYNTHROID) 75 mcg tablet Take 1 Tab by mouth Daily (before breakfast). 90 Tab 1   ??? rosuvastatin (CRESTOR) 40 mg tablet Take 1 Tab by mouth daily. 90 Tab 1   ??? gabapentin (NEURONTIN) 300 mg capsule take 1 capsule by mouth every morning and 2 capsules by mouth at bedtime 180 Cap 1   ??? omeprazole (PRILOSEC) 40 mg capsule take 1 capsule by mouth once daily BEFORE BREAKFAST 90 Cap 1   ??? montelukast (SINGULAIR) 10 mg tablet Take 1 Tab by mouth nightly as needed. Indications: asthma 90 Tab 1   ??? ranitidine (ZANTAC) 150 mg tablet Take 1 Tab by mouth two (2) times a day. 180 Tab 1   ??? albuterol (PROVENTIL HFA) 90 mcg/actuation inhaler Take 2 Puffs by inhalation every six (6) hours as needed for Wheezing. 1 Inhaler 6   ??? gabapentin (NEURONTIN) 300 mg capsule take 1 capsule by mouth every morning and 2 capsules by mouth at bedtime 90 Cap 3   ??? traMADol (ULTRAM) 50 mg tablet Take 1 Tab by mouth every six (6) hours as needed for Pain. Max Daily Amount: 200 mg. 40 Tab 0   ??? budesonide-formoterol (SYMBICORT) 160-4.5 mcg/actuation HFA inhaler Take  2 Puffs by inhalation two (2) times a day. 1 Inhaler 5   ??? IRON DEXTRAN COMPLEX (IRON DEXTRAN IV) by IntraVENous route.     ??? Cholecalciferol, Vitamin D3, (VITAMIN D-3) 5,000 unit Tab Take  by mouth daily.         Vital signs:    Visit Vitals   ??? BP 140/78   ??? Pulse 89   ??? Temp 98.9 ??F (37.2 ??C) (Oral)   ??? Resp 16   ??? Ht 5' 4" (1.626 m)   ??? Wt 92.1 kg (203 lb)   ??? SpO2 97%   ??? BMI 34.84 kg/m2       Review of Systems:   As above otherwise 11 point review of systems negative including;   Constitutional no fever or chills  Skin denies rash or itching  HEENT  Denies tinnitus, hearing lose  Eyes denies diplopia vision lose  Respiratory denies sortness of breath  Cardiovascular denies chest pain, dyspnea on exertion  Gastrointestinal denies nausea, vomiting, diarrhea, constipation  Genitourinary denies incontinence  Musculoskeletal denies joint pain or swelling  Endocrine denies weight change  Hematology denies easy bruising or bleeding   Neurological as above in HPI        Patient Active Problem List   Diagnosis Code   ??? DOE (dyspnea on exertion) R06.09   ??? Hypothyroidism E03.9   ??? Hypercholesteremia E78.0   ??? HTN (hypertension), benign I10   ??? GERD (gastroesophageal reflux disease) K21.9   ??? Asthma J45.909   ??? Vitamin D deficiency E55.9   ??? Iron deficiency anemia D50.9   ??? Submental lymphadenopathy R59.0   ??? Sciatica M54.30   ??? Depression F32.9   ??? Meralgia paraesthetica G57.10   ??? Obesity (BMI 30-39.9) E66.9   ??? Pre-diabetes R73.09   ??? Abnormal stress test R94.39   ??? Need for hepatitis C screening test Z11.59   ??? Encounter for screening mammogram for breast cancer Z12.31   ??? Left knee pain M25.562   ??? Swelling of left lower extremity M79.89   ??? Osteoarthritis of left knee M17.9   ??? Post-menopausal Z78.0   ??? 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 Dehydration E86.0   ??? Migraine without aura and without status migrainosus, not intractable G43.009   ??? Claustrophobia F40.240          Objective: The patient is awake, alert, and oriented x 4.  Fund of knowledge is adequate.  Speech is fluent and memory is intact.  Cranial Nerves: II ??? Visual fields are full to confrontation.  III, IV, VI ??? Extraocular movements are intact. There is no nystagmus. V ??? Facial sensation is intact to pinprick.  VII ??? Face is symmetrical.  VIII - Hearing is present.  IX, X, XII ??? Palate is symmetrical.   XI - Shoulder shrugging and head turning intact  Motor:  The patient moves all four limbs fairly well and symmetrically. Tone is normal. Reflexes are 2+ and symmetrical. Plantars are down going. Gait is normal.    Final result (Exam End: 07/27/2015 ??9:39 AM) Reviewed    Study Result   MR CERVICAL SPINE WITHOUT AND WITH CONTRAST  ??  CPT CODE: 72156  ??  HISTORY: Headache, neck pain, history Chiari malformation.  ??  COMPARISON: Prior MRI cervical spine, 05/05/2007.  ??  TECHNIQUE: Cervical spine scanned with axial and sagittal T2W scans and with  sagittal T1W scans. Post gadolinium axial and sagittal T1W scans then obtained.  ??  FINDINGS: ??  ??  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  ??  No associated hydrocephalus, or cord syrinx is identified.  ??  Patient status post anterior plating and fusion, C3-C5. Hardware appears intact.  Fusion appears solid.  ??  Straightening of the normal cervical lordosis secondary to multilevel fusion. ??  Bone marrow signal nonpathologic. Degenerative desiccation and narrowing noted  at C5/C6, and C6/C7. Cord looks normal. Paravertebral soft tissues unremarkable.  No enhancing abnormalities appreciated.  ??  C2/C3: Normal ??  ??  C3/C4: First level of upper cervical fusion. Central canal and exit foramina are  patent.  ??  C4/C5: The level of the upper cervical fusion. Central canal and exit foramina  are patent. ??  ??   C5/C6: Use bulging disc annulus. Acquired central canal stenosis appreciated,  with mild central canal compromise, residual canal diameter at 10 to 11 mm.  Bilateral left greater than right mild bony exit foraminal stenosis. ??  ??  C6/C7: Mild diffuse bulging disc annulus with slight ventral impression the  thecal sac. No significant central canal or exit foramen compromise. ??  ??  C7/T1: No   significant central canal disc pathology. Mild lateral disc bulge to  each exit foramen, without significant foraminal stenosis. ??  ??  ????  ??  IMPRESSION  IMPRESSION:  ??  No enhancing abnormalities.  ??  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 No associated hydrocephalus, or  cord syrinx.  ??  Postoperative changes after anterior plating and fusion, C3-C5.  ??  Degenerative spondylosis, C5/C6 and C6/C7, without significant alterations, all  other is an element of mild central canal stenosis on a multifactorial basis,  C5-C6.  ??  Central canal contents, and paravertebral soft tissues otherwise unremarkable.  ??  Thank you for this referral.  ??         I have reviewed the above imagines myself.    CBC:   Lab Results   Component Value Date/Time    WBC 5.5 04/27/2015 12:00 AM    RBC 4.40 04/27/2015 12:00 AM    HGB 10.8 04/27/2015 12:00 AM    HCT 33.4 04/27/2015 12:00 AM    PLATELET 289 04/27/2015 12:00 AM     BMP:   Lab Results   Component Value Date/Time    GLUCOSE 96 04/27/2015 12:00 AM    SODIUM 143 04/27/2015 12:00 AM    POTASSIUM 3.8 04/27/2015 12:00 AM    CHLORIDE 99 04/27/2015 12:00 AM    CO2 28 04/27/2015 12:00 AM    BUN 14 04/27/2015 12:00 AM    CREATININE 0.71 04/27/2015 12:00 AM    CALCIUM 9.7 04/27/2015 12:00 AM     CMP:   Lab Results   Component Value Date/Time    GLUCOSE 96 04/27/2015 12:00 AM    SODIUM 143 04/27/2015 12:00 AM    POTASSIUM 3.8 04/27/2015 12:00 AM    CHLORIDE 99 04/27/2015 12:00 AM    CO2 28 04/27/2015 12:00 AM    BUN 14 04/27/2015 12:00 AM    CREATININE 0.71 04/27/2015 12:00 AM     CALCIUM 9.7 04/27/2015 12:00 AM    ANION GAP 5 07/09/2014 12:00 PM    BUN/CREATININE RATIO 20 04/27/2015 12:00 AM    ALK. PHOSPHATASE 69 04/27/2015 12:00 AM    PROTEIN, TOTAL 6.9 04/27/2015 12:00 AM    ALBUMIN 4.2 04/27/2015 12:00 AM    GLOBULIN 4.1 07/09/2014 12:00 PM    A-G RATIO 1.6 04/27/2015 12:00 AM     Coagulation:   Lab Results   Component Value Date/Time    INR, EXTERNAL 1.0 10/09/2014     Cardiac markers:   Lab Results   Component Value Date/Time    CK 122 07/09/2014 12:00 PM    CK-MB INDEX 0.4 07/09/2014 12:00 PM       Assessment:  Benign looking cervical spinal cord just showing post operative changes.  Headaches, quiescent at this time, but patient refusing medications because of potential side effects.       Plan:  I've convinced her she can try the Topamax and see if it helps with the headaches.  Return here in 3 months.      Sincerely,        Elvena Oyer A. Pakou Rainbow, M.D.

## 2015-07-30 NOTE — Progress Notes (Signed)
Re:  Elaine [name],Follow up visit     07/30/2015 4:24 PM    SSN: [id number]    Subjective:   Elaine [name] returns for follow up of her headaches.  She's not taken the Topamax because of fear of side effects.  She feels like the headaches are somewhat better, she hasn't had any for the last several weeks.  There's no neck pain.  She was able to tolerate the MRI scan with a little Xanax.     Medications:    Current Outpatient Prescriptions   Medication Sig Dispense Refill   ??? ALPRAZolam (XANAX) 0.5 mg tablet 1 tab PO BID starting 2 days prior to MRI scan  Indications: ANXIETY 6 Tab 0   ??? topiramate (TOPAMAX) 25 mg tablet Take 1 Tab by mouth two (2) times a day. 60 Tab 2   ??? VARICELLA-ZOSTER VACINE LIVE 19,400 unit/0.65 mL susr injection   0   ??? valsartan-hydrochlorothiazide (DIOVAN-HCT) 160-25 mg per tablet take 1 tablet by mouth daily 90 Tab 1   ??? levothyroxine (SYNTHROID) 75 mcg tablet Take 1 Tab by mouth Daily (before breakfast). 90 Tab 1   ??? rosuvastatin (CRESTOR) 40 mg tablet Take 1 Tab by mouth daily. 90 Tab 1   ??? gabapentin (NEURONTIN) 300 mg capsule take 1 capsule by mouth every morning and 2 capsules by mouth at bedtime 180 Cap 1   ??? omeprazole (PRILOSEC) 40 mg capsule take 1 capsule by mouth once daily BEFORE BREAKFAST 90 Cap 1   ??? montelukast (SINGULAIR) 10 mg tablet Take 1 Tab by mouth nightly as needed. Indications: asthma 90 Tab 1   ??? ranitidine (ZANTAC) 150 mg tablet Take 1 Tab by mouth two (2) times a day. 180 Tab 1   ??? albuterol (PROVENTIL HFA) 90 mcg/actuation inhaler Take 2 Puffs by inhalation every six (6) hours as needed for Wheezing. 1 Inhaler 6   ??? gabapentin (NEURONTIN) 300 mg capsule take 1 capsule by mouth every morning and 2 capsules by mouth at bedtime 90 Cap 3   ??? traMADol (ULTRAM) 50 mg tablet Take 1 Tab by mouth every six (6) hours as needed for Pain. Max Daily Amount: 200 mg. 40 Tab 0   ??? budesonide-formoterol (SYMBICORT) 160-4.5 mcg/actuation HFA inhaler Take  2 Puffs by inhalation two (2) times a day. 1 Inhaler 5   ??? IRON DEXTRAN COMPLEX (IRON DEXTRAN IV) by IntraVENous route.     ??? Cholecalciferol, Vitamin D3, (VITAMIN D-3) 5,000 unit Tab Take  by mouth daily.         Vital signs:    Visit Vitals   ??? BP 140/78   ??? Pulse 89   ??? Temp 98.9 ??F (37.2 ??C) (Oral)   ??? Resp 16   ??? Ht _0  (1.626 m)   ??? Wt 92.1 kg (203 lb)   ??? SpO2 97%   ??? BMI 34.84 kg/m2       Review of Systems:   As above otherwise 11 point review of systems negative including;   Constitutional no fever or chills  Skin denies rash or itching  HEENT  Denies tinnitus, hearing lose  Eyes denies diplopia vision lose  Respiratory denies sortness of breath  Cardiovascular denies chest pain, dyspnea on exertion  Gastrointestinal denies nausea, vomiting, diarrhea, constipation  Genitourinary denies incontinence  Musculoskeletal denies joint pain or swelling  Endocrine denies weight change  Hematology denies easy bruising or bleeding   Neurological as above in HPI  Patient Active Problem List   Diagnosis Code   ??? DOE (dyspnea on exertion) R06.09   ??? Hypothyroidism E03.9   ??? Hypercholesteremia E78.0   ??? HTN (hypertension), benign I10   ??? GERD (gastroesophageal reflux disease) K21.9   ??? Asthma J45.909   ??? Vitamin D deficiency E55.9   ??? Iron deficiency anemia D50.9   ??? Submental lymphadenopathy R59.0   ??? Sciatica M54.30   ??? Depression F32.9   ??? Meralgia paraesthetica G57.10   ??? Obesity (BMI 30-39.9) E66.9   ??? Pre-diabetes R73.09   ??? Abnormal stress test R94.39   ??? Need for hepatitis C screening test Z11.59   ??? Encounter for screening mammogram for breast cancer Z12.31   ??? Left knee pain M25.562   ??? Swelling of left lower extremity M79.89   ??? Osteoarthritis of left knee M17.9   ??? Post-menopausal Z78.0   ??? 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 Dehydration E86.0   ??? Migraine without aura and without status migrainosus, not intractable G43.009   ??? Claustrophobia F40.240          Objective: The patient is awake, alert, and oriented x 4.  Fund of knowledge is adequate.  Speech is fluent and memory is intact.  Cranial Nerves: II ??? Visual fields are full to confrontation.  III, IV, VI ??? Extraocular movements are intact. There is no nystagmus. V ??? Facial sensation is intact to pinprick.  VII ??? Face is symmetrical.  VIII - Hearing is present.  IX, X, XII ??? Palate is symmetrical.   XI - Shoulder shrugging and head turning intact  Motor:  The patient moves all four limbs fairly well and symmetrically. Tone is normal. Reflexes are 2+ and symmetrical. Plantars are down going. Gait is normal.    Final result (Exam End: 07/27/2015 ??9:39 AM) Reviewed    Study Result   MR CERVICAL SPINE WITHOUT AND WITH CONTRAST  ??  CPT CODE: 85462  ??  HISTORY: Headache, neck pain, history Chiari malformation.  ??  COMPARISON: Prior MRI cervical spine, 05/05/2007.  ??  TECHNIQUE: Cervical spine scanned with axial and sagittal T2W scans and with  sagittal T1W scans. Post gadolinium axial and sagittal T1W scans then obtained.  ??  FINDINGS: ??  ??  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  ??  No associated hydrocephalus, or cord syrinx is identified.  ??  Patient status post anterior plating and fusion, C3-C5. Hardware appears intact.  Fusion appears solid.  ??  Straightening of the normal cervical lordosis secondary to multilevel fusion. ??  Bone marrow signal nonpathologic. Degenerative desiccation and narrowing noted  at C5/C6, and C6/C7. Cord looks normal. Paravertebral soft tissues unremarkable.  No enhancing abnormalities appreciated.  ??  C2/C3: Normal ??  ??  C3/C4: First level of upper cervical fusion. Central canal and exit foramina are  patent.  ??  C4/C5: The level of the upper cervical fusion. Central canal and exit foramina  are patent. ??  ??   C5/C6: Use bulging disc annulus. Acquired central canal stenosis appreciated,  with mild central canal compromise, residual canal diameter at 10 to 11 mm.  Bilateral left greater than right mild bony exit foraminal stenosis. ??  ??  C6/C7: Mild diffuse bulging disc annulus with slight ventral impression the  thecal sac. No significant central canal or exit foramen compromise. ??  ??  C7/T1: No  significant central canal disc pathology. Mild lateral disc bulge to  each exit foramen, without significant foraminal stenosis. ??  ??  ????  ??  IMPRESSION  IMPRESSION:  ??  No enhancing abnormalities.  ??  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 No associated hydrocephalus, or  cord syrinx.  ??  Postoperative changes after anterior plating and fusion, C3-C5.  ??  Degenerative spondylosis, C5/C6 and C6/C7, without significant alterations, all  other is an element of mild central canal stenosis on a multifactorial basis,  C5-C6.  ??  Central canal contents, and paravertebral soft tissues otherwise unremarkable.  ??  Thank you for this referral.  ??         I have reviewed the above imagines myself.    CBC:   Lab Results   Component Value Date/Time    WBC 5.5 04/27/2015 12:00 AM    RBC 4.40 04/27/2015 12:00 AM    HGB 10.8 04/27/2015 12:00 AM    HCT 33.4 04/27/2015 12:00 AM    PLATELET 289 04/27/2015 12:00 AM     BMP:   Lab Results   Component Value Date/Time    GLUCOSE 96 04/27/2015 12:00 AM    SODIUM 143 04/27/2015 12:00 AM    POTASSIUM 3.8 04/27/2015 12:00 AM    CHLORIDE 99 04/27/2015 12:00 AM    CO2 28 04/27/2015 12:00 AM    BUN 14 04/27/2015 12:00 AM    CREATININE 0.71 04/27/2015 12:00 AM    CALCIUM 9.7 04/27/2015 12:00 AM     CMP:   Lab Results   Component Value Date/Time    GLUCOSE 96 04/27/2015 12:00 AM    SODIUM 143 04/27/2015 12:00 AM    POTASSIUM 3.8 04/27/2015 12:00 AM    CHLORIDE 99 04/27/2015 12:00 AM    CO2 28 04/27/2015 12:00 AM    BUN 14 04/27/2015 12:00 AM    CREATININE 0.71 04/27/2015 12:00 AM     CALCIUM 9.7 04/27/2015 12:00 AM    ANION GAP 5 07/09/2014 12:00 PM    BUN/CREATININE RATIO 20 04/27/2015 12:00 AM    ALK. PHOSPHATASE 69 04/27/2015 12:00 AM    PROTEIN, TOTAL 6.9 04/27/2015 12:00 AM    ALBUMIN 4.2 04/27/2015 12:00 AM    GLOBULIN 4.1 07/09/2014 12:00 PM    A-G RATIO 1.6 04/27/2015 12:00 AM     Coagulation:   Lab Results   Component Value Date/Time    INR, EXTERNAL 1.0 10/09/2014     Cardiac markers:   Lab Results   Component Value Date/Time    CK 122 07/09/2014 12:00 PM    CK-MB INDEX 0.4 07/09/2014 12:00 PM       Assessment:  Benign looking cervical spinal cord just showing post operative changes.  Headaches, quiescent at this time, but patient refusing medications because of potential side effects.       Plan:  I've convinced her she can try the Topamax and see if it helps with the headaches.  Return here in 3 months.      Sincerely,        Legrand Como A. Gretchen Portela, M.D.

## 2015-09-04 ENCOUNTER — Encounter

## 2015-09-04 MED ORDER — OMEPRAZOLE 40 MG CAP, DELAYED RELEASE
40 mg | ORAL_CAPSULE | ORAL | 5 refills | Status: DC
Start: 2015-09-04 — End: 2016-10-20

## 2015-09-04 MED ORDER — ROSUVASTATIN 40 MG TAB
40 mg | ORAL_TABLET | ORAL | 3 refills | Status: DC
Start: 2015-09-04 — End: 2016-01-24

## 2015-09-04 MED ORDER — VALSARTAN-HYDROCHLOROTHIAZIDE 160 MG-25 MG TAB
160-25 mg | ORAL_TABLET | ORAL | 3 refills | Status: DC
Start: 2015-09-04 — End: 2016-01-24

## 2015-09-04 MED ORDER — LEVOTHYROXINE 75 MCG TAB
75 mcg | ORAL_TABLET | ORAL | 3 refills | Status: DC
Start: 2015-09-04 — End: 2016-02-27

## 2015-09-04 NOTE — Telephone Encounter (Signed)
Unable to leave message that medication was sent to the pharmacy no voice mail.

## 2015-09-04 NOTE — Telephone Encounter (Signed)
Omeprazole, Crestor, Synthroid, and Valsartan-HCTZ refilled and sent to pharmacy. Please notify pt.

## 2015-09-16 MED ORDER — GABAPENTIN 300 MG CAP
300 mg | ORAL_CAPSULE | ORAL | 3 refills | Status: DC
Start: 2015-09-16 — End: 2016-03-09

## 2015-09-16 NOTE — Telephone Encounter (Signed)
Gabapentin refilled and sent to pharmacy. Please notify pt.

## 2015-09-17 ENCOUNTER — Ambulatory Visit
Admit: 2015-09-17 | Discharge: 2015-09-17 | Payer: PRIVATE HEALTH INSURANCE | Attending: Internal Medicine | Primary: Internal Medicine

## 2015-09-17 DIAGNOSIS — G8929 Other chronic pain: Secondary | ICD-10-CM

## 2015-09-17 MED ORDER — PREDNISONE 20 MG TAB
20 mg | ORAL_TABLET | Freq: Every day | ORAL | 0 refills | Status: DC
Start: 2015-09-17 — End: 2016-06-01

## 2015-09-17 MED ORDER — TRIAMCINOLONE ACETONIDE 0.1 % TOPICAL CREAM
0.1 % | Freq: Two times a day (BID) | CUTANEOUS | 0 refills | Status: DC
Start: 2015-09-17 — End: 2016-10-20

## 2015-09-17 MED ORDER — CYCLOBENZAPRINE 10 MG TAB
10 mg | ORAL_TABLET | Freq: Two times a day (BID) | ORAL | 0 refills | Status: DC
Start: 2015-09-17 — End: 2016-07-03

## 2015-09-17 NOTE — Progress Notes (Signed)
Patient is in the office today with c/o back pain and a rash in the middle of her back.    Do you have an Advance Directive no  Do you want more information has information.    1. Have you been to the ER, urgent care clinic since your last visit?  Hospitalized since your last visit?No    2. Have you seen or consulted any other health care providers outside of the [facility] since your last visit?  Include any pap smears or colon screening. No      Verbal order read back per  Dr. Birdena Jubilee vaccine. Patient received vaccine in LD. Patient tolerated well and left without complaints. Patient was given Flu VIS information sheet.

## 2015-09-17 NOTE — Progress Notes (Signed)
Chief Complaint   Patient presents with   ??? Rash   ??? Back Pain         HPI:  Patient is a 64 year old African American female with medical problems listed below presents today for an acute visit with back pain. She has chronic low back pain localized mostly in the midline which has flared up lately. Associated is rash on lower mid back that is not itchy. Otherwise, she has been feeling well and denies recent fall, trauma, urinary symptoms, bowel or bladder incontinence, or other complaints today. She is complaint with her medications with no adverse effects reported.         Past Medical History   Diagnosis Date   ??? Anemia    ??? Asthma    ??? Colon polyp    ??? DDD (degenerative disc disease) 02/15/2009   ??? Depression 12/18/2011   ??? Essential hypertension, benign    ??? History of echocardiogram 03/20/2004     EF 70%.  Borderline DDfx.  No significant valvular pathology.   ??? Hypercholesterolemia    ??? Hypertension    ??? Iron deficiency anemia 2/99/3716   ??? Lichen planus 9/67/8938   ??? Lower extremity venous duplex 10/23/2009     Left leg:  No DVT.     ??? Meralgia paraesthetica 10/22/2013   ??? OA (osteoarthritis of spine) 02/15/2009   ??? OA (osteoarthritis of spine) 02/15/2009   ??? Obesity, unspecified    ??? Other and unspecified hyperlipidemia    ??? Pre-operative cardiovascular examination      For spine surgery   ??? Right sided sciatica    ??? Shortness of breath      Possible asthma, HCVD; less likely CAD (Noted 03/15/09)   ??? Sleep apnea 02/15/2009     uses cpap machine   ??? Thallium stress test  03/20/2004     Partially transient, mod basal & mid anterior defect most c/w artifact; mild anterior ischemia less likely.  Neg EKG on pharm stress test.   ??? Thyroid disease      hypothyroidism     Allergies   Allergen Reactions   ??? Asa-Acetaminophen-Caff-Potass Other (comments)     Bleeding in stomach   ??? Benadryl [Diphenhydramine Hcl] Other (comments)     Muscle jerking   ??? Other Plant, Animal, Environmental Not Reported This Time      Grass, Dust and Mold   ??? Pollen Extracts Not Reported This Time     Current Outpatient Prescriptions   Medication Sig Dispense Refill   ??? valsartan-hydrochlorothiazide (DIOVAN-HCT) 160-25 mg per tablet take 1 tablet by mouth daily 30 Tab 3   ??? levothyroxine (SYNTHROID) 75 mcg tablet Take 1 Tab by mouth Daily (before breakfast). 90 Tab 1   ??? rosuvastatin (CRESTOR) 40 mg tablet Take 1 Tab by mouth daily. 90 Tab 1   ??? gabapentin (NEURONTIN) 300 mg capsule take 1 capsule by mouth every morning and 2 capsules by mouth at bedtime 180 Cap 1   ??? omeprazole (PRILOSEC) 40 mg capsule take 1 capsule by mouth once daily BEFORE BREAKFAST 90 Cap 1   ??? montelukast (SINGULAIR) 10 mg tablet Take 1 Tab by mouth nightly as needed. Indications: asthma 90 Tab 1   ??? ranitidine (ZANTAC) 150 mg tablet Take 1 Tab by mouth two (2) times a day. 180 Tab 1   ??? IRON DEXTRAN COMPLEX (IRON DEXTRAN IV) by IntraVENous route.     ??? Cholecalciferol, Vitamin D3, (VITAMIN D-3) 5,000 unit  Tab Take  by mouth daily.     ??? gabapentin (NEURONTIN) 300 mg capsule take 1 capsule by mouth every morning and 2 at bedtime 90 Cap 3   ??? omeprazole (PRILOSEC) 40 mg capsule TAKE 1 CAPSULE BY MOUTH ONCE A DAY BEFORE BREAKFAST 30 Cap 5   ??? rosuvastatin (CRESTOR) 40 mg tablet take 1 tablet by mouth once daily 30 Tab 3   ??? levothyroxine (SYNTHROID) 75 mcg tablet take 1 tablet by mouth every morning ON AN EMPTY STOMACH 30 Tab 3   ??? ALPRAZolam (XANAX) 0.5 mg tablet 1 tab PO BID starting 2 days prior to MRI scan  Indications: ANXIETY 6 Tab 0   ??? topiramate (TOPAMAX) 25 mg tablet Take 1 Tab by mouth two (2) times a day. 60 Tab 2   ??? VARICELLA-ZOSTER VACINE LIVE 19,400 unit/0.65 mL susr injection   0   ??? albuterol (PROVENTIL HFA) 90 mcg/actuation inhaler Take 2 Puffs by inhalation every six (6) hours as needed for Wheezing. 1 Inhaler 6   ??? traMADol (ULTRAM) 50 mg tablet Take 1 Tab by mouth every six (6) hours as needed for Pain. Max Daily Amount: 200 mg. 40 Tab 0    ??? budesonide-formoterol (SYMBICORT) 160-4.5 mcg/actuation HFA inhaler Take 2 Puffs by inhalation two (2) times a day. 1 Inhaler 5          ROS:  Pertinent as in HPI        Physical Exam:  Visit Vitals   ??? BP 108/60 (BP 1 Location: Left arm, BP Patient Position: Sitting)   ??? Pulse 84   ??? Temp 97.8 ??F (36.6 ??C) (Oral)   ??? Resp 16   ??? Ht 5\' 4"  (1.626 m)   ??? Wt 197 lb 9.6 oz (89.6 kg)   ??? SpO2 92%   ??? BMI 33.92 kg/m2     General: Obese black female, a & o x 3, afebrile, interacting appropriately, in no acute distress  Skin: Rash noted on lower mid back  Respiratory: symmetrical chest expansion, lung sounds clear bilaterally  Cardiovascular: normal S1S2, regular rate and rhythm  Musculoskeletal: Mild tenderness mid low back.      Assessment/Plan:    ICD-10-CM ICD-9-CM    1. Chronic midline low back pain without sciatica M54.5 724.2 cyclobenzaprine (FLEXERIL) 10 mg tablet    G89.29 338.29 predniSONE (DELTASONE) 20 mg tablet   2. Rash R21 782.1 triamcinolone acetonide (KENALOG) 0.1 % topical cream   3. Need for influenza vaccination Z23 V04.81 Flu shot given today   4. Encounter for immunization Z23 V03.89 INFLUENZA VIRUS VAC QUAD,SPLIT,PRESV FREE SYRINGE 3/> YRS IM           Orders Placed This Encounter   ??? Influenza virus vaccine (QUADRIVALENT PRES FREE SYRINGE) IM 3 years and older   ??? cyclobenzaprine (FLEXERIL) 10 mg tablet     Sig: Take 0.5 Tabs by mouth two (2) times a day.     Dispense:  60 Tab     Refill:  0   ??? predniSONE (DELTASONE) 20 mg tablet     Sig: Take 1 Tab by mouth daily (with breakfast).     Dispense:  10 Tab     Refill:  0   ??? triamcinolone acetonide (KENALOG) 0.1 % topical cream     Sig: Apply  to affected area two (2) times a day. use thin layer     Dispense:  60 g     Refill:  0  Additional Notes: Discussed today's diagnosis, treatment plans. Discussed medication indications and side effects.    Weight Management:Counseled  After Visit Summary: Discussed provided printed patient instructions.  Answered questions accordingly.  Follow-up Disposition: As previously scheduled          Osie Cheeks, DO, MPH  Internal Medicine

## 2015-09-17 NOTE — Patient Instructions (Addendum)
Influenza (Flu) Vaccine (Inactivated or Recombinant): What You Need to Know  Why get vaccinated?  Influenza ("flu") is a contagious disease that spreads around the [place] every winter, usually between October and May.  Flu is caused by influenza viruses and is spread mainly by coughing, sneezing, and close contact.  Anyone can get flu. Flu strikes suddenly and can last several days. Symptoms vary by age, but can include:  ?? Fever/chills.  ?? Sore throat.  ?? Muscle aches.  ?? Fatigue.  ?? Cough.  ?? Headache.  ?? Runny or stuffy nose.  Flu can also lead to pneumonia and blood infections, and cause diarrhea and seizures in children. If you have a medical condition, such as heart or lung disease, flu can make it worse.  Flu is more dangerous for some people. Infants and young children, people 65 years of age and older, pregnant women, and people with certain health conditions or a weakened immune system are at greatest risk.  Each year thousands of people in the [place] die from flu, and many more are hospitalized.  Flu vaccine can:   ?? Keep you from getting flu.  ?? Make flu less severe if you do get it.  ?? Keep you from spreading flu to your family and other people.  Inactivated and recombinant flu vaccines  A dose of flu vaccine is recommended every flu season. Children 6 months through 8 years of age may need two doses during the same flu season. Everyone else needs only one dose each flu season.  Some inactivated flu vaccines contain a very small amount of a mercury-based preservative called thimerosal. Studies have not shown thimerosal in vaccines to be harmful, but flu vaccines that do not contain thimerosal are available.  There is no live flu virus in flu shots. They cannot cause the flu.  There are many flu viruses, and they are always changing. Each year a new flu vaccine is made to protect against three or four viruses that are likely to cause disease in the upcoming flu season. But even when the  vaccine doesn't exactly match these viruses, it may still provide some protection.  Flu vaccine cannot prevent:  ?? Flu that is caused by a virus not covered by the vaccine.  ?? Illnesses that look like flu but are not.  Some people should not get this vaccine  Tell the person who is giving you the vaccine:  ?? If you have any severe (life-threatening) allergies. If you ever had a life-threatening allergic reaction after a dose of flu vaccine, or have a severe allergy to any part of this vaccine, you may be advised not to get vaccinated. Most, but not all, types of flu vaccine contain a small amount of egg protein.  ?? If you ever had Guillain-Barr?? syndrome (also called GBS) Some people with a history of GBS should not get this vaccine. This should be discussed with your doctor.  ?? If you are not feeling well. It is usually okay to get flu vaccine when you have a mild illness, but you might be asked to come back when you feel better.  Risks of a vaccine reaction  With any medicine, including vaccines, there is a chance of reactions. These are usually mild and go away on their own, but serious reactions are also possible.  Most people who get a flu shot do not have any problems with it.  Minor problems following a flu shot include:  ?? Soreness, redness, or   swelling where the shot was given  ?? Hoarseness  ?? Sore, red or itchy eyes  ?? Cough  ?? Fever  ?? Aches  ?? Headache  ?? Itching  ?? Fatigue  If these problems occur, they usually begin soon after the shot and last 1 or 2 days.  More serious problems following a flu shot can include the following:  ?? There may be a small increased risk of Guillain-Barr?? Syndrome (GBS) after inactivated flu vaccine. This risk has been estimated at 1 or 2 additional cases per million people vaccinated. This is much lower than the risk of severe complications from flu, which can be prevented by flu vaccine.  ?? Young children who get the flu shot along with pneumococcal vaccine  (PCV13) and/or DTaP vaccine at the same time might be slightly more likely to have a seizure caused by fever. Ask your doctor for more information. Tell your doctor if a child who is getting flu vaccine has ever had a seizure  Problems that could happen after any injected vaccine:   ?? People sometimes faint after a medical procedure, including vaccination. Sitting or lying down for about 15 minutes can help prevent fainting, and injuries caused by a fall. Tell your doctor if you feel dizzy, or have vision changes or ringing in the ears.  ?? Some people get severe pain in the shoulder and have difficulty moving the arm where a shot was given. This happens very rarely.  ?? Any medication can cause a severe allergic reaction. Such reactions from a vaccine are very rare, estimated at about 1 in a million doses, and would happen within a few minutes to a few hours after the vaccination.  As with any medicine, there is a very remote chance of a vaccine causing a serious injury or death.  The safety of vaccines is always being monitored. For more information, visit: www.cdc.gov/vaccinesafety/.  What if there is a serious reaction?  What should I look for?  ?? Look for anything that concerns you, such as signs of a severe allergic reaction, very high fever, or unusual behavior.  Signs of a severe allergic reaction can include hives, swelling of the face and throat, difficulty breathing, a fast heartbeat, dizziness, and weakness ??? usually within a few minutes to a few hours after the vaccination.  What should I do?  ?? If you think it is a severe allergic reaction or other emergency that can't wait, call 9-1-1 and get the person to the nearest hospital. Otherwise, call your doctor.  ?? Reactions should be reported to the "Vaccine Adverse Event Reporting System" (VAERS). Your doctor should file this report, or you can do it yourself through the VAERS website at www.vaers.hhs.gov, or by calling 1-800-822-7967.   VAERS does not give medical advice.  The National Vaccine Injury Compensation Program  The National Vaccine Injury Compensation Program (VICP) is a federal program that was created to compensate people who may have been injured by certain vaccines.  Persons who believe they may have been injured by a vaccine can learn about the program and about filing a claim by calling 1-800-338-2382 or visiting the VICP website at www.hrsa.gov/vaccinecompensation. There is a time limit to file a claim for compensation.  How can I learn more?  ?? Ask your healthcare provider. He or she can give you the vaccine package insert or suggest other sources of information.  ?? Call your local or state health department.  ?? Contact the Centers for Disease Control and   Prevention (CDC):  ?? Call 424-735-9743 (1-800-CDC-INFO) or  ?? Visit CDC's website at https://gibson.com/  Vaccine Information Statement  Inactivated Influenza Vaccine  07/13/2014)  [place] ?? [phone]  Department of Health and [employer] for Disease Control and Prevention  Many Vaccine Information Statements are available in Spanish and other languages. See [employer]olutelyGenuine.com.br.  Muchas hojas de informaci??n sobre vacunas est??n disponibles en espa??ol y en otros idiomas. Visite [employer]olutelyGenuine.com.br.  Content Version: 11.0.578772        Learning About Relief for Back Pain  What is back tension and strain?     Back strain happens when you overstretch, or pull, a muscle in your back. You may hurt your back in an accident or when you exercise or lift something.  Most back pain will get better with rest and time. You can take care of yourself at home to help your back heal.  What can you do first to relieve back pain?  When you first feel back pain, try these steps:  ?? Walk. Take a short walk (10 to 20 minutes) on a level surface (no slopes, hills, or stairs) every 2 to 3 hours. Walk only distances you can manage without pain, especially leg pain.   ?? Relax. Find a comfortable position for rest. Some people are comfortable on the floor or a medium-firm bed with a small pillow under their head and another under their knees. Some people prefer to lie on their side with a pillow between their knees. Don't stay in one position for too long.  ?? Try heat or ice. Try using a heating pad on a low or medium setting, or take a warm shower, for 15 to 20 minutes every 2 to 3 hours. Or you can buy single-use heat wraps that last up to 8 hours. You can also try an ice pack for 10 to 15 minutes every 2 to 3 hours. You can use an ice pack or a bag of frozen vegetables wrapped in a thin towel. There is not strong evidence that either heat or ice will help, but you can try them to see if they help. You may also want to try switching between heat and cold.  ?? Take pain medicine exactly as directed.  ?? If the doctor gave you a prescription medicine for pain, take it as prescribed.  ?? If you are not taking a prescription pain medicine, ask your doctor if you can take an over-the-counter medicine.  What else can you do?  ?? Stretch and exercise. Exercises that increase flexibility may relieve your pain and make it easier for your muscles to keep your spine in a good, neutral position. And don't forget to keep walking.  ?? Do self-massage. You can use self-massage to unwind after work or school or to energize yourself in the morning. You can easily massage your feet, hands, or neck. Self-massage works best if you are in comfortable clothes and are sitting or lying in a comfortable position. Use oil or lotion to massage bare skin.  ?? Reduce stress. Back pain can lead to a vicious circle: Distress about the pain tenses the muscles in your back, which in turn causes more pain. Learn how to relax your mind and your muscles to lower your stress.  Where can you learn more?  Go to [employer].at   Enter Q517 in the search box to learn more about "Learning About Relief for Back Pain."  ?? 2006-2016 [employer]. Care instructions adapted under license by  [employer] (which disclaims liability or warranty for this information). This care instruction is for use with your licensed healthcare professional. If you have questions about a medical condition or this instruction, always ask your healthcare professional. [place] any warranty or liability for your use of this information.  Content Version: 11.0.578772; Current as of: Apr 29, 2015

## 2015-09-25 NOTE — Addendum Note (Signed)
Addended by: Rebecka Apley D on: 09/25/2015 07:56 PM      Modules accepted: Level of Service

## 2015-10-23 ENCOUNTER — Encounter

## 2015-10-29 ENCOUNTER — Encounter: Attending: Neurology | Primary: Internal Medicine

## 2015-10-30 ENCOUNTER — Encounter: Attending: Internal Medicine | Primary: Internal Medicine

## 2015-10-30 ENCOUNTER — Encounter: Attending: Neurology | Primary: Internal Medicine

## 2015-11-19 ENCOUNTER — Inpatient Hospital Stay: Admit: 2015-11-19 | Primary: Internal Medicine

## 2015-11-20 LAB — HEPATITIS C ANTIBODY: Hepatitis C Ab: <0.1 {s_co_ratio} (ref 0.0–0.9)

## 2015-11-20 LAB — HEPATITIS C AB: Hep C Virus Ab: <0.1 s/co ratio (ref 0.0–0.9)

## 2015-11-21 ENCOUNTER — Encounter: Attending: Internal Medicine | Primary: Internal Medicine

## 2015-11-26 ENCOUNTER — Ambulatory Visit
Admit: 2015-11-26 | Discharge: 2015-11-26 | Payer: PRIVATE HEALTH INSURANCE | Attending: Internal Medicine | Primary: Internal Medicine

## 2015-11-26 DIAGNOSIS — E032 Hypothyroidism due to medicaments and other exogenous substances: Secondary | ICD-10-CM

## 2015-11-26 NOTE — Progress Notes (Signed)
Chief Complaint   Patient presents with   ??? Results     hep c lab review   ??? Thyroid Problem   ??? Cholesterol Problem   ??? Hypertension   ??? Knee Pain     rated 5   ??? Back Pain     rated 8. would like to request an order for PT         HPI:  Patient is a 64 year old obese African American female with medical problems listed below presents today for follow up of Hypertension, Hyperlipidemia, Hypothyroidism, Vit D def, GERD, etc. She has chronic low back pain which has been worse lately and requests referral to physical therapy and also has osteoarthritis affecting the left knee improved with tylenol. Otherwise, she has been feeling well and voices no other complaints today. She is not complaint with her medications as she has not taken her blood pressure medications in several days as she ran out, resulting in her blood pressure elevated at the office today initially at 170/98 taken by nurse with repeat by me at 180/100. She has been eating more especially during thanksgiving and has gained 9 pounds in the last 2 months.       Past Medical History   Diagnosis Date   ??? Anemia    ??? Asthma    ??? Colon polyp    ??? DDD (degenerative disc disease) 02/15/2009   ??? Depression 12/18/2011   ??? Essential hypertension, benign    ??? History of echocardiogram 03/20/2004     EF 70%.  Borderline DDfx.  No significant valvular pathology.   ??? Hypercholesterolemia    ??? Hypertension    ??? Iron deficiency anemia 99991111   ??? 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 Lower extremity venous duplex 10/23/2009     Left leg:  No DVT.     ??? Meralgia paraesthetica 10/22/2013   ??? OA (osteoarthritis of spine) 02/15/2009   ??? OA (osteoarthritis of spine) 02/15/2009   ??? Obesity, unspecified    ??? Other and unspecified hyperlipidemia    ??? Pre-operative cardiovascular examination      For spine surgery   ??? Right sided sciatica    ??? Shortness of breath      Possible asthma, HCVD; less likely CAD (Noted 03/15/09)   ??? Sleep apnea 02/15/2009     uses cpap machine    ??? Thallium stress test  03/20/2004     Partially transient, mod basal & mid anterior defect most c/w artifact; mild anterior ischemia less likely.  Neg EKG on pharm stress test.   ??? Thyroid disease      hypothyroidism     Allergies   Allergen Reactions   ??? Asa-Acetaminophen-Caff-Potass Other (comments)     Bleeding in stomach   ??? Benadryl [Diphenhydramine Hcl] Other (comments)     Muscle jerking   ??? Other Plant, Animal, Environmental Not Reported This Time     Grass, Dust and Mold   ??? Pollen Extracts Not Reported This Time     Current Outpatient Prescriptions   Medication Sig Dispense Refill   ??? cyclobenzaprine (FLEXERIL) 10 mg tablet Take 0.5 Tabs by mouth two (2) times a day. 60 Tab 0   ??? predniSONE (DELTASONE) 20 mg tablet Take 1 Tab by mouth daily (with breakfast). 10 Tab 0   ??? triamcinolone acetonide (KENALOG) 0.1 % topical cream Apply  to affected area two (2) times a day. use thin layer 60 g 0   ??? gabapentin (  NEURONTIN) 300 mg capsule take 1 capsule by mouth every morning and 2 at bedtime 90 Cap 3   ??? omeprazole (PRILOSEC) 40 mg capsule TAKE 1 CAPSULE BY MOUTH ONCE A DAY BEFORE BREAKFAST 30 Cap 5   ??? rosuvastatin (CRESTOR) 40 mg tablet take 1 tablet by mouth once daily 30 Tab 3   ??? levothyroxine (SYNTHROID) 75 mcg tablet take 1 tablet by mouth every morning ON AN EMPTY STOMACH 30 Tab 3   ??? valsartan-hydrochlorothiazide (DIOVAN-HCT) 160-25 mg per tablet take 1 tablet by mouth daily 30 Tab 3   ??? ALPRAZolam (XANAX) 0.5 mg tablet 1 tab PO BID starting 2 days prior to MRI scan  Indications: ANXIETY 6 Tab 0   ??? topiramate (TOPAMAX) 25 mg tablet Take 1 Tab by mouth two (2) times a day. 60 Tab 2   ??? VARICELLA-ZOSTER VACINE LIVE 19,400 unit/0.65 mL susr injection   0   ??? levothyroxine (SYNTHROID) 75 mcg tablet Take 1 Tab by mouth Daily (before breakfast). 90 Tab 1   ??? rosuvastatin (CRESTOR) 40 mg tablet Take 1 Tab by mouth daily. 90 Tab 1   ??? gabapentin (NEURONTIN) 300 mg capsule take 1 capsule by mouth every  morning and 2 capsules by mouth at bedtime 180 Cap 1   ??? omeprazole (PRILOSEC) 40 mg capsule take 1 capsule by mouth once daily BEFORE BREAKFAST 90 Cap 1   ??? montelukast (SINGULAIR) 10 mg tablet Take 1 Tab by mouth nightly as needed. Indications: asthma 90 Tab 1   ??? ranitidine (ZANTAC) 150 mg tablet Take 1 Tab by mouth two (2) times a day. 180 Tab 1   ??? albuterol (PROVENTIL HFA) 90 mcg/actuation inhaler Take 2 Puffs by inhalation every six (6) hours as needed for Wheezing. 1 Inhaler 6   ??? traMADol (ULTRAM) 50 mg tablet Take 1 Tab by mouth every six (6) hours as needed for Pain. Max Daily Amount: 200 mg. 40 Tab 0   ??? budesonide-formoterol (SYMBICORT) 160-4.5 mcg/actuation HFA inhaler Take 2 Puffs by inhalation two (2) times a day. 1 Inhaler 5   ??? IRON DEXTRAN COMPLEX (IRON DEXTRAN IV) by IntraVENous route.     ??? Cholecalciferol, Vitamin D3, (VITAMIN D-3) 5,000 unit Tab Take  by mouth daily.            ROS:  Constitutional: Negative for fever, chills, or fatigue  Neurological: Negative for headache, dizziness, visual disturbance, or loss of conciousness  Respiratory: Negative for SOB, hemoptysis, or wheezing  Cardiovascular: Negative for chest pain, palpitation, or leg swelling  Gastrointestinal: Negative for abdominal pain, nausea, vomiting, diarrhea, blood in stool, melena, or heartburn  Musculoskeletal: Positive for chronic low back pain and left knee pain. Negative for falls          Physical Exam:  Visit Vitals   ??? BP (!) 170/98 (BP 1 Location: Left arm, BP Patient Position: Sitting)   ??? Pulse 85   ??? Temp 98.3 ??F (36.8 ??C) (Oral)   ??? Resp 20   ??? Ht 5\' 4"  (1.626 m)   ??? Wt 206 lb (93.4 kg)   ??? SpO2 92%   ??? BMI 35.36 kg/m2     General: Obese black female, a & o x 3, afebrile, interacting appropriately, in no acute distress  Skin: warm and dry, no rashes , no bruises  Neck: supple, symmetrical, no thyromegaly  Respiratory: symmetrical chest expansion, lung sounds clear bilaterally,  good air entry, good respiratory effort, no wheezes or crackles  Cardiovascular:  normal S1S2, regular rate and rhythm, no murmurs, pulses palpable, no thrill, no carotid or abdominal bruits, no peripheral edema, no JVD  Abdomen: non-distended, normoactive bowel sounds x 4 quadrants, soft, non-tender to palpation  Musculoskeletal: Mild tenderness mild low back and left knee      Assessment/Plan:    ICD-10-CM ICD-9-CM    1. 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 Hypercholesteremia E78.00 272.0 Continue Crestor and she was counseled on low fat diet - will check lipid panel at f/u in 4 weeks  LIPID PANEL   3. HTN (hypertension), benign I10 401.1 Uncontrolled from not taking BP meds in few days.  She was advised to restart taking her BP meds and was counseled on low salt diet, with emphasis on compliance.  CBC WITH AUTOMATED DIFF      METABOLIC PANEL, COMPREHENSIVE   4. Gastroesophageal reflux disease without esophagitis K21.9 530.81 Stable   5. Vitamin D deficiency E55.9 268.9 VITAMIN D, 25 HYDROXY   6. Obesity (BMI 30-39.9) E66.9 278.00 Discussed the patient's above normal BMI with her.  I have recommended the following interventions: dietary management education, guidance, and counseling  encourage exercise  lifestyle education regarding diet .  The BMI follow up plan is as follows: BMI is out of normal parameters and plan is as follows: I have counseled this patient on diet and exercise regimens   7. Post-menopausal Z78.0 V49.81 DEXA BONE DENSITY STUDY AXIAL   8. Encounter for screening mammogram for breast cancer Z12.31 V76.12 MAM MAMMO BI SCREENING DIGTL   9. Chronic midline low back pain without sciatica M54.5 724.2 REFERRAL TO PHYSICAL THERAPY    G89.29 338.29    10. Encounter for long-term (current) use of medications Z79.899 V58.69 HEMOGLOBIN A1C W/O EAG    11. Osteoarthritis of left knee, unspecified osteoarthritis type M17.9 715.96 She was advised to continue analgesics as needed         Orders Placed This Encounter   ??? MAM MAMMO BI SCREENING DIGTL     Standing Status:   Future     Standing Expiration Date:   12/26/2016     Order Specific Question:   Reason for Exam     Answer:   Screening mammogram   ??? DEXA BONE DENSITY STUDY AXIAL     Standing Status:   Future     Standing Expiration Date:   12/26/2016     Order Specific Question:   Reason for Exam     Answer:   Post menopausal   ??? CBC WITH AUTOMATED DIFF     Standing Status:   Future     Standing Expiration Date:   05/24/2016   ??? HEMOGLOBIN A1C W/O EAG     Standing Status:   Future     Standing Expiration Date:   11/26/2016   ??? LIPID PANEL     Standing Status:   Future     Standing Expiration Date:   123456   ??? METABOLIC PANEL, COMPREHENSIVE     Standing Status:   Future     Standing Expiration Date:   05/24/2016   ??? T4, FREE     Standing Status:   Future     Standing Expiration Date:   05/24/2016   ??? TSH 3RD GENERATION     Standing Status:   Future  Standing Expiration Date:   03/25/2016   ??? VITAMIN D, 25 HYDROXY     Standing Status:   Future     Standing Expiration Date:   03/26/2016   ??? REFERRAL TO PHYSICAL THERAPY     Referral Priority:   Routine     Referral Type:   PT/OT/ST     Referral Reason:   Specialty Services Required         Additional Notes: Discussed today's diagnosis, treatment plans. Discussed medication indications and side effects.    Weight Management:Counseled  After Visit Summary: Discussed provided printed patient instructions. Answered questions accordingly.  Follow-up Disposition: As previously scheduled          Osie Cheeks, DO, MPH  Internal Medicine        Total time spent on today's visit was 40 minutes with greater than 50% of time spent involved in counseling on uncontrolled hypertension, hyperlipidemia, medication compliance, appropriate diet and weight loss  measures, making appropriate referrals, and coordination of care as outlined above.

## 2015-11-26 NOTE — Patient Instructions (Signed)
DASH Diet: Care Instructions  Your Care Instructions  The DASH diet is an eating plan that can help lower your blood pressure. DASH stands for Dietary Approaches to Stop Hypertension. Hypertension is high blood pressure.  The DASH diet focuses on eating foods that are high in calcium, potassium, and magnesium. These nutrients can lower blood pressure. The foods that are highest in these nutrients are fruits, vegetables, low-fat dairy products, nuts, seeds, and legumes. But taking calcium, potassium, and magnesium supplements instead of eating foods that are high in those nutrients does not have the same effect. The DASH diet also includes whole grains, fish, and poultry.  The DASH diet is one of several lifestyle changes your doctor may recommend to lower your high blood pressure. Your doctor may also want you to decrease the amount of sodium in your diet. Lowering sodium while following the DASH diet can lower blood pressure even further than just the DASH diet alone.  Follow-up care is a key part of your treatment and safety. Be sure to make and go to all appointments, and call your doctor if you are having problems. It's also a good idea to know your test results and keep a list of the medicines you take.  How can you care for yourself at home?  Following the DASH diet  ?? Eat 4 to 5 servings of fruit each day. A serving is 1 medium-sized piece of fruit, ?? cup chopped or canned fruit, 1/4 cup dried fruit, or 4 ounces (?? cup) of fruit juice. Choose fruit more often than fruit juice.  ?? Eat 4 to 5 servings of vegetables each day. A serving is 1 cup of lettuce or raw leafy vegetables, ?? cup of chopped or cooked vegetables, or 4 ounces (?? cup) of vegetable juice. Choose vegetables more often than vegetable juice.  ?? Get 2 to 3 servings of low-fat and fat-free dairy each day. A serving is 8 ounces of milk, 1 cup of yogurt, or 1 ?? ounces of cheese.   ?? Eat 6 to 8 servings of grains each day. A serving is 1 slice of bread, 1 ounce of dry cereal, or ?? cup of cooked rice, pasta, or cooked cereal. Try to choose whole-grain products as much as possible.  ?? Limit lean meat, poultry, and fish to 2 servings each day. A serving is 3 ounces, about the size of a deck of cards.  ?? Eat 4 to 5 servings of nuts, seeds, and legumes (cooked dried beans, lentils, and split peas) each week. A serving is 1/3 cup of nuts, 2 tablespoons of seeds, or ?? cup of cooked beans or peas.  ?? Limit fats and oils to 2 to 3 servings each day. A serving is 1 teaspoon of vegetable oil or 2 tablespoons of salad dressing.  ?? Limit sweets and added sugars to 5 servings or less a week. A serving is 1 tablespoon jelly or jam, ?? cup sorbet, or 1 cup of lemonade.  ?? Eat less than 2,300 milligrams (mg) of sodium a day. If you limit your sodium to 1,500 mg a day, you can lower your blood pressure even more.  Tips for success  ?? Start small. Do not try to make dramatic changes to your diet all at once. You might feel that you are missing out on your favorite foods and then be more likely to not follow the plan. Make small changes, and stick with them. Once those changes become habit, add a few more   changes.  ?? Try some of the following:  ?? Make it a goal to eat a fruit or vegetable at every meal and at snacks. This will make it easy to get the recommended amount of fruits and vegetables each day.  ?? Try yogurt topped with fruit and nuts for a snack or healthy dessert.  ?? Add lettuce, tomato, cucumber, and onion to sandwiches.  ?? Combine a ready-made pizza crust with low-fat mozzarella cheese and lots of vegetable toppings. Try using tomatoes, squash, spinach, broccoli, carrots, cauliflower, and onions.  ?? Have a variety of cut-up vegetables with a low-fat dip as an appetizer instead of chips and dip.  ?? Sprinkle sunflower seeds or chopped almonds over salads. Or try adding  chopped walnuts or almonds to cooked vegetables.  ?? Try some vegetarian meals using beans and peas. Add garbanzo or kidney beans to salads. Make burritos and tacos with mashed pinto beans or black beans.  Where can you learn more?  Go to [employer].  Enter [phone] in the search box to learn more about "DASH Diet: Care Instructions."  Current as of: February 27, 2015  Content Version: 11.1  ?? 2006-2016 [employer]. Care instructions adapted under license by [employer] (which disclaims liability or warranty for this information). If you have questions about a medical condition or this instruction, always ask your healthcare professional. [place] any warranty or liability for your use of this information.       Starting a Weight Loss Plan: Care Instructions  Your Care Instructions  If you are thinking about losing weight, it can be hard to know where to start. Your doctor can help you set up a weight loss plan that best meets your needs. You may want to take a class on nutrition or exercise, or join a weight loss support group. If you have questions about how to make changes to your eating or exercise habits, ask your doctor about seeing a registered dietitian or an exercise specialist.  It can be a big challenge to lose weight. But you do not have to make huge changes at once. Make small changes, and stick with them. When those changes become habit, add a few more changes.  If you do not think you are ready to make changes right now, try to pick a date in the future. Make an appointment to see your doctor to discuss whether the time is right for you to start a plan.  Follow-up care is a key part of your treatment and safety. Be sure to make and go to all appointments, and call your doctor if you are having problems. It???s also a good idea to know your test results and keep a list of the medicines you take.   How can you care for yourself at home?  ?? Set realistic goals. Many people expect to lose much more weight than is likely. A weight loss of 5% to 10% of your body weight may be enough to improve your health.  ?? Get family and friends involved to provide support. Talk to them about why you are trying to lose weight, and ask them to help. They can help by participating in exercise and having meals with you, even if they may be eating something different.  ?? Find what works best for you. If you do not have time or do not like to cook, a program that offers meal replacement bars or shakes may be better for you. Or if you  like to prepare meals, finding a plan that includes daily menus and recipes may be best.  ?? Ask your doctor about other health professionals who can help you achieve your weight loss goals.  ?? A dietitian can help you make healthy changes in your diet.  ?? An exercise specialist or personal trainer can help you develop a safe and effective exercise program.  ?? A counselor or psychiatrist can help you cope with issues such as depression, anxiety, or family problems that can make it hard to focus on weight loss.  ?? Consider joining a support group for people who are trying to lose weight. Your doctor can suggest groups in your area.  Where can you learn more?  Go to http://www.healthwise.net/[employer].  Enter U357 in the search box to learn more about "Starting a Weight Loss Plan: Care Instructions."  Current as of: January 22, 2015  Content Version: 11.1  ?? 2006-2016 [employer]. Care instructions adapted under license by [employer] (which disclaims liability or warranty for this information). If you have questions about a medical condition or this instruction, always ask your healthcare professional. [employer] disclaims any warranty or liability for your use of this information.

## 2015-11-26 NOTE — Progress Notes (Signed)
Chief Complaint   Patient presents with   ??? Results     hep c lab review   ??? Thyroid Problem   ??? Cholesterol Problem   ??? Hypertension   ??? Knee Pain     rated 5   ??? Back Pain     rated 8. would like to request an order for PT       1. Have you been to the ER, urgent care clinic since your last visit?  Hospitalized since your last visit?No    2. Have you seen or consulted any other health care providers outside of the [place] since your last visit?  Include any pap smears or colon screening. No

## 2015-12-11 ENCOUNTER — Ambulatory Visit: Payer: MEDICARE | Primary: Internal Medicine

## 2015-12-12 ENCOUNTER — Encounter: Payer: BLUE CROSS/BLUE SHIELD | Primary: Internal Medicine

## 2015-12-12 ENCOUNTER — Encounter: Primary: Internal Medicine

## 2015-12-27 ENCOUNTER — Encounter

## 2015-12-30 ENCOUNTER — Inpatient Hospital Stay: Admit: 2015-12-30 | Payer: BLUE CROSS/BLUE SHIELD | Primary: Internal Medicine

## 2015-12-30 DIAGNOSIS — M545 Low back pain: Secondary | ICD-10-CM

## 2015-12-30 NOTE — Progress Notes (Signed)
[facility] ??? [place] [place] [address]  [phone] fax    Plan of Care/ Statement of Necessity for Physical Therapy Services    Patient name: Elaine [name] Start of Care: 12/30/2015   Referral source: Osie Cheeks, DO DOB: Mar 16, 1951    Medical Diagnosis: Low back pain [M54.5]   Onset Date:exacerbation over the past year    Treatment Diagnosis: decrease tolerance to ADLs and activities due to mid line LBP , decreased awareness to posture   Prior Hospitalization: see medical history Provider#: [id number]   Medications: Verified on Patient summary List    Comorbidities: arthritis, HTN, asthma, Thyroid problems   Prior Level of Function: I all areas of ADLs and activities, No AD use, 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  Assessment/ key information: 65 YO female diagnosed as above and with S/S consistent with above diagnosis Presents to skilled outpatient PT. CCO central LBP - she notes a longstanding history of involvement with most recent exacerbation over the past year and worsening. With the pain when severe it limits her ability to walk. She attributes some of the  involvement related to her job demands. Additionally reports burning sensation in both hips when she has been attempting a walking based exercise tape.   Previous Treatment/Compliance:Prior PT, injections, xrays, MRI,   Pain 2, FOTO 50, Stands FF 10 degrees and notes relief with FF posture including over the grocery cart when shopping, Stands with B knees Flexed for comfort. AROM FF 5 cm from floor, E 12 degrees, SB R/L 40 cm/45cm ROT R/L 100%/75%, + Piriformis tension test R>L, Moderate STC B Piriformis and  buttocks and  TTP Central Lower back/LS region.  Patient demonstrates the potential to make gains with improved ROM, strength, endurance/activity tolerance, functional FOTO survey score  and all within a reasonable time frame so as to increase their functional independence with ADLs and activities for carryover to Improved quality of life, tolerance to work demands and her home exercises. Patient requires skilled Physical Therapy so as to monitor their response to and modify their treatment plan accordingly. Patient appears to be an appropriate candidate for skilled outpatient Physical Therapy.  Patient will continue to benefit from skilled PT services to modify and progress therapeutic interventions, address functional mobility deficits, address ROM deficits, address strength deficits, analyze and address soft tissue restrictions, analyze and cue movement patterns, analyze and modify body mechanics/ergonomics, assess and modify postural abnormalities and instruct in home and community integration to attain remaining goals.    Evaluation Complexity History HIGH Complexity :3+ comorbidities / personal factors will impact the outcome/ POC ; Examination HIGH Complexity : 4+ Standardized tests and measures addressing body structure, function, activity limitation and / or participation in recreation  ;Presentation LOW Complexity : Stable, uncomplicated  ;Clinical Decision Making MEDIUM Complexity : FOTO score of 26-74  Overall Complexity Rating: LOW   Problem List: pain affecting function, decrease ROM, decrease ADL/ functional abilitiies, decrease activity tolerance, decrease flexibility/ joint mobility, decrease transfer abilities and other FOTO 50   Treatment Plan may include any combination of the following: Therapeutic exercise, Therapeutic activities, Neuromuscular re-education, Physical agent/modality, Manual therapy and Patient education  Patient / Family readiness  to learn indicated by: asking questions, trying  to perform skills and interest  Persons(s) to be included in education: patient (P)  Barriers to Learning/Limitations: None  Patient Goal (s): ???find something I can do to lessen the pain, I want to get rid of the pain???  Patient Self Reported Health Status: good  Rehabilitation Potential: good    Short Term Goals: To be accomplished in 5 treatments:   1 patient will have established and be independent with HEP to aid with progression of skilled PT program   EVAL issued   CURRENT   2 patient will have FOTO 55 improved to  To show increase tolerance to ADLS and activities   EVAL 50   CURRENT    Long Term Goals: To be accomplished in 10 treatments:   1 patient will have FOTO 59 improved to  To show increase tolerance to ADLS and activities   EVAL 50   CURRENT   2 patient will have overall reports of improvement at 50% to aid with increase tolerance to her work demands   EVAL   CURRENT   3 patient will have pain 0-1/10 to aid with increase tolerance to ADLS   EVAL 2   CURRENT   4 patient will have improved standing posture at N for increased awareness to better posture at work   EVAL stands FF 12 degrees   CURRENT    5 patient will tolerate ambulation 500 feet and no increase S/S for carryover to community ambulation and work demands   EVAL limited ambulation when pain is severe   CURRENT     Frequency / Duration: Patient to be seen 2-3 times per week for 10 treatments.    Patient/ Caregiver education and instruction: Diagnosis, prognosis, self care, activity modification and exercises     Plan of care has been reviewed with PTA    Forrest Moron, PT 12/30/2015 4:19 PM  ________________________________________________________________________    I certify that the above Therapy Services are being furnished while the patient is under my care. I agree with the treatment plan and certify that this therapy is necessary.    97 Signature:____________________  Date:____________Time: _________     Please sign and return to [facility] ??? [facility] [place] [address]  [phone]   [phone] fax

## 2015-12-30 NOTE — Progress Notes (Signed)
[facility] ??? 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  Medications: Verified on Patient Summary List    Visits from Start of Care: 1    Missed Visits: 0  Reporting Period : 12/30/15 to 12/30/15      Summary of Care:Patient seen for evaluation only and did not return for any follow up sessions. She had financial issues that limited her ability to participate. She was issued exercises at her first visit that she could use for her HEP. Thank you.   Goal:patient will have established and be independent with HEP to aid with progression of skilled PT program  Status at last note/certification:eval  Status at discharge: met    Goal:patient will have FOTO 55 improved to To show increase tolerance to ADLS and activities  Status at last note/certification:eval  Status at discharge: not met    Goal:patient will have FOTO 59 improved to To show increase tolerance to ADLS and activities  Status at last note/certification:eval  Status at discharge: not met    Goal:patient will have overall reports of improvement at 50% to aid with increase tolerance to her work demands  Status at last note/certification:eval  Status at discharge: not met      ASSESSMENT/RECOMMENDATIONS:  Discontinue therapy progressing towards or have reached established goals   Discontinue therapy due to lack of appreciable progress towards goals  Discontinue therapy due to lack of attendance or compliance  Other:financial issues    Thank you for this referral.     Forrest Moron, PT 02/25/2016 8:48 AM

## 2015-12-30 NOTE — Progress Notes (Signed)
PT DAILY TREATMENT NOTE/LUMBAR EVAL 3-16    Patient Name: Elaine [name]  Date:12/30/2015  DOB: 19-Dec-1950    Patient DOB Verified  [place] / Plan: [place] MED[place] PART [place] / Product Type: Medicare /    In time:349  Out time:430  Total Treatment Time (min): 41  Total Timed Codes (min): 8  1:1 Treatment Time (MC only): 8   Visit #: 1 of 10    Treatment Area: Low back pain [M54.5]  SUBJECTIVE  Pain Level (0-10 scale): 2  constant intermittent improving worsening no change since onset    Any medication changes, allergies to medications, adverse drug reactions, diagnosis change, or new procedure performed?:  No     Yes (see summary sheet for update)  Subjective functional status/changes:     PLOF:I all areas of ADLs and activities, No AD use,   Limitations to PLOF: pain  Mechanism of Injury: inisideous onset pain, longstanding, progressive involvement X 32 years  Current symptoms/Complaints: 66 YO female diagnosed as above and with S/S consistent with above diagnosis  Presents to skilled outpatient PT. CCO central LBP - she notes a longstanding history of involvement with most recent exacerbation over the past year and worsening. With the pain when severe it limits her ability to walk. She attributes some of the  involvement related to her job demands. Additionally reports burning sensation in both hips when she has been attempting a walking based exercise tape.   Previous Treatment/Compliance:Prior PT, injections, xrays, MRI,   PMHx/Surgical Hx: arthritis, HTN, asthma, Thyroid problems  Work [id number] working FT in the [employer], occupational and describes a lot of up and position changes  Living Situation: lives in a 2 story house, 0 steps to enter, lives alone. 14 steps to second floor  Pt Goals: find something I can do to lessen the pain, I want to get rid of the pain  Barriers: pain financial time transportation other  Motivation:high  Substance use: Alcohol Tobacco other:   FABQ Score: low elevate   Cognition: A & O x 4      Other:    OBJECTIVE/EXAMINATION  Domestic Life: needs to tolerate her housework, likes to travel, at home exercise tapes   Activity/Recreational Limitations: limited by pain  Mobility: I , no AD  Self Care: I all ADLS and activities        Modality rationale:     Min Type Additional Details     Estim:  Unatt       IFC  Premod                        Other:  w/ice   w/heat  Position:  Location:     Estim: Att    TENS instruct  NMES                    Other:  w/US   w/ice   w/heat  Position:  Location:      Traction:  Cervical       Lumbar                        Prone          Supine                       Intermittent   Continuous Lbs:   before manual   after manual  Ultrasound: Continuous    Pulsed                           1MHz   3MHz Location:  W/cm2:      Iontophoresis with dexamethasone         Location:  Take home patch    In clinic      Ice       heat    Ice massage    Laser     Anodyne Position:  Location:      Laser with stim    Other: Position:  Location:      Vasopneumatic Device Pressure:        lo  med  hi   Temperature:  lo  med  hi    Skin assessment post-treatment:  intact redness- no adverse reaction    redness ??? adverse reaction:     33 min Eval                  Re-Eval       8 min Therapeutic Exercise:   See flow sheet :   Rationale: increase ROM, increase strength and improve coordination to improve the patient???s ability to aid with increase tolerance to ADLS and activities     min Therapeutic Activity:    See flow sheet :   Rationale:   to improve the patient???s ability to       min Neuromuscular Re-education:    See flow sheet :   Rationale:   to improve the patient???s ability to      min Manual Therapy:     Rationale:  to       min Gait Training:  ___ feet with ___ device on level surfaces with ___ level of assist   Rationale:          With    TE    TA    neuro    other: Patient Education:  Review HEP     Progressed/Changed HEP based on:     positioning    body mechanics    transfers    heat/ice application     other:      Other Objective/Functional Measures:     Physical Therapy Evaluation - Lumbar Spine ([facility])    SUBJECTIVE  Chief Complaint:    Mechanism of injury:    Symptoms:  Aggravated by:    Bending  Sitting  Standing  Walking    Moving  Cough  Sneeze  Valsalva    AM   PM  Lying:   sup    pro    sidelying    Other: intermittently up and down movements at work      Eased by:     Bending  Sitting  Standing  Walking    Moving  AM   PM  Lying:  sup   pro   sidelying    Other:pain meds OTC , pain patches, ice/ heat,      Leaning forward/ Forward flexed  General Health:  Red Flags Indicated?  Yes     No   Yes  No Recent weight change (If yes, due to dieting?  Yes   No)    Yes  No Weakness in legs during walking   Yes  No Unremitting pain at night   Yes  No Abdominal pain or problems   Yes  No Rectal bleeding   Yes  No Feet  more cold or painful in cold weather   Yes  No Menstrual irregularities   Yes  No Blood or pain with urination   Yes  No Dysfunction of bowel or bladder   Yes  No Recent illness within past 3 weeks (i.e, cold, flu)   Yes  No Numbness/tingling in buttock/genitalia region    Past History/Treatments:     Diagnostic Tests:  Lab work  X-rays     CT  MRI      Other:  Results:    Functional Status  Prior level of function:  Present functional limitations:  What position do you sleep in?:    OBJECTIVE  Posture: Stands FF 10 degrees and notes relief with FF posture including over the grocery cart when shopping                Stands with B knees Flexed for comfort  Lateral Shift:  R     L      +   -  Kyphosis:  Increased  Decreased     WNL  Lordosis:   Increased  Decreased    WNL  Pelvic symmetry:  WNL     Other:    Gait:   Normal      Abnormal:    Active Movements:  N/A    Too acute    Other:  ROM  AROM % PROM Comments:pain, area   Forward flexion 40-60 5 cm from floor     Extension 20-30 Able to attain N and move to  12 degrees Ext      SB right 20-30 40 cm     SB left 20-30 45 cm      Rotation right 5-10 100%     Rotation left 5-10 75%       Repeated Movements   Effects on present pain: produces (PR), abolishes (A), increases (incr), decreases (decr), centralizes (C), peripheral (PH), no effect (NE)   Pre-Test Sx Flexion Repeated Flexion Extension Repeated Extension Repeated SBL Repeated SBR   Sitting          Standing          Lying      N/A N/A   Comments:  Side Glide:  Sustained passive positioning test:    Neuro Screen  WNL  Myotome/Dermatome/Reflexes:  Comments:    Dural Mobility:  SLR Sitting:  R     L     +     -  @ (degrees):           Supine:  R     L     +     -  @ (degrees):   Slump Test:  R     L     +     -  @ (degrees):   Prone Knee Bend:  R     L     +     -     Palpation   Min   Mod   Severe    Location: STC  B  Piriformis and buttocks   Min   Mod   Severe    Location: TTP  Central Lower back/LS region   Min   Mod   Severe    Location:    Strength   L(0-5) R (0-5) N/T   Hip Flexion (L1,2)      Knee Extension (L3,4)      Ankle Dorsiflexion (L4)      Great Toe Extension (  L5)      Ankle Plantarflexion (S1)      Knee Flexion (S1,2)      Upper Abdominals      Lower Abdominals      Paraspinals      Back Rotators      Gluteus Maximus      Other        Special Tests  Lumbar:  Lumb. Compression:  Pos   Neg               Lumbar Distraction:    Pos   Neg    Quadrant:   Pos   Neg    Flex   Ext    Sacroilliac:  Gaenslen's:  R     L     +     -     Compression:  +     -     Gapping:   +     -     Thigh Thrust:  R     L     +     -     Leg Length:  +     -   Position:    Crests:    ASIS:    PSIS:    Sacral Sulcus:    Mobility: Standing flex:     Sitting flex:     Supine to sit:     Prone knee bend:         Hip: Corky Sox:   R     L     +     -     Scour:   R     L     +     -     Piriformis:  R >     L     +     -          Deficits: Ober's:  R     L     +     -     Thomas:  R     L     +     -     Hamstrings 90/90:     Gastrocsoleus (to neutral): Right: Left:       Global Muscular Weakness:  Abdominals:  Quadratus Lumborum:  Paraspinals:  Other:    Other tests/comments:       Pain Level (0-10 scale) post treatment: 2    ASSESSMENT/Changes in Function: Patient demonstrates the potential to make gains with improved ROM, strength, endurance/activity tolerance, functional FOTO survey score  and all within a reasonable time frame so as to increase their functional independence with ADLs and activities for carryover to  Improved quality of life, tolerance to work demands and her home exercises. Patient requires skilled Physical Therapy so as to monitor their response to and modify their treatment plan accordingly. Patient appears to be an appropriate candidate for skilled outpatient Physical Therapy.  Patient will continue to benefit from skilled PT services to modify and progress therapeutic interventions, address functional mobility deficits, address ROM deficits, address strength deficits, analyze and address soft tissue restrictions, analyze and cue movement patterns, analyze and modify body mechanics/ergonomics, assess and modify postural abnormalities and instruct in home and community integration to attain remaining goals.       See Plan of Care    See progress note/recertification    See Discharge Summary         Progress towards goals /  Updated goals:       PLAN    Upgrade activities as tolerated       Continue plan of care    Update interventions per flow sheet         Discharge due to:_    Other:_      Forrest Moron, PT 12/30/2015  3:34 PM

## 2016-01-24 ENCOUNTER — Encounter

## 2016-01-27 MED ORDER — MONTELUKAST 10 MG TAB
10 mg | ORAL_TABLET | Freq: Every evening | ORAL | 1 refills | Status: DC | PRN
Start: 2016-01-27 — End: 2016-08-10

## 2016-01-27 MED ORDER — VALSARTAN-HYDROCHLOROTHIAZIDE 160 MG-25 MG TAB
160-25 mg | ORAL_TABLET | ORAL | 3 refills | Status: DC
Start: 2016-01-27 — End: 2016-06-08

## 2016-01-27 MED ORDER — LEVOTHYROXINE 75 MCG TAB
75 mcg | ORAL_TABLET | Freq: Every day | ORAL | 1 refills | Status: DC
Start: 2016-01-27 — End: 2016-02-27

## 2016-01-27 MED ORDER — ROSUVASTATIN 40 MG TAB
40 mg | ORAL_TABLET | ORAL | 3 refills | Status: DC
Start: 2016-01-27 — End: 2016-06-08

## 2016-01-27 NOTE — Telephone Encounter (Signed)
Synthroid, Diovan-HCT, Crestor, and Singulair refilled and sent to pharmacy. Please notify pt.

## 2016-01-28 ENCOUNTER — Encounter: Attending: Internal Medicine | Primary: Internal Medicine

## 2016-01-30 ENCOUNTER — Inpatient Hospital Stay: Admit: 2016-01-30 | Payer: BLUE CROSS/BLUE SHIELD | Attending: Internal Medicine | Primary: Internal Medicine

## 2016-01-30 DIAGNOSIS — Z780 Asymptomatic menopausal state: Secondary | ICD-10-CM

## 2016-01-30 DIAGNOSIS — Z1231 Encounter for screening mammogram for malignant neoplasm of breast: Secondary | ICD-10-CM

## 2016-01-30 NOTE — Progress Notes (Signed)
Please call and notify pt that her recent mammogram was okay with no malignancy noted.

## 2016-01-30 NOTE — Progress Notes (Signed)
I called the patient regarding her results but there was no answer. So i left a message that requested a call back.  Pt coming in on Tuesday.

## 2016-01-30 NOTE — Progress Notes (Signed)
I called the patient regarding her results but there was no answer. So i left a message that requested a call back.

## 2016-02-06 ENCOUNTER — Ambulatory Visit
Admit: 2016-02-06 | Discharge: 2016-02-06 | Payer: PRIVATE HEALTH INSURANCE | Attending: Internal Medicine | Primary: Internal Medicine

## 2016-02-06 DIAGNOSIS — J069 Acute upper respiratory infection, unspecified: Secondary | ICD-10-CM

## 2016-02-06 MED ORDER — HYDROCODONE 10 MG-CHLORPHENIRAMINE 8 MG/5 ML ORAL SUSP EXTEND.REL 12HR
10-8 mg/5 mL | Freq: Two times a day (BID) | ORAL | 0 refills | Status: DC | PRN
Start: 2016-02-06 — End: 2016-07-03

## 2016-02-06 MED ORDER — LEVOFLOXACIN 500 MG TAB
500 mg | ORAL_TABLET | Freq: Every day | ORAL | 0 refills | Status: AC
Start: 2016-02-06 — End: 2016-02-16

## 2016-02-06 MED ORDER — PREDNISONE 10 MG TABLETS IN A DOSE PACK
10 mg | ORAL_TABLET | ORAL | 0 refills | Status: DC
Start: 2016-02-06 — End: 2016-07-03

## 2016-02-06 NOTE — Progress Notes (Signed)
Chief Complaint   Patient presents with   ??? Cold Symptoms   ??? Cough         HPI:  Patient is a 65 year old African American female with medical problems listed below presents today for cold symptoms. Symptoms started two weeks ago with chest congestion and associated is cough productive of greenish sputum, rhinorrhea, sneezing, nasal congestion, mild intermittent SOB, though she denies fever, chills, sore throat, or ear pain. She took Nyquil and [employer] with no significant improvement and thus came in today for evaluation.        Past Medical History:   Diagnosis Date   ??? Anemia    ??? Asthma    ??? Colon polyp    ??? DDD (degenerative disc disease) 02/15/2009   ??? Depression 12/18/2011   ??? Essential hypertension, benign    ??? History of echocardiogram 03/20/2004    EF 70%.  Borderline DDfx.  No significant valvular pathology.   ??? Hypercholesterolemia    ??? Hypertension    ??? Iron deficiency anemia 99991111   ??? 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 Lower extremity venous duplex 10/23/2009    Left leg:  No DVT.     ??? Meralgia paraesthetica 10/22/2013   ??? OA (osteoarthritis of spine) 02/15/2009   ??? OA (osteoarthritis of spine) 02/15/2009   ??? Obesity, unspecified    ??? Other and unspecified hyperlipidemia    ??? Pre-operative cardiovascular examination     For spine surgery   ??? Right sided sciatica    ??? Shortness of breath     Possible asthma, HCVD; less likely CAD (Noted 03/15/09)   ??? Sleep apnea 02/15/2009    uses cpap machine   ??? Thallium stress test  03/20/2004    Partially transient, mod basal & mid anterior defect most c/w artifact; mild anterior ischemia less likely.  Neg EKG on pharm stress test.   ??? Thyroid disease     hypothyroidism     Allergies   Allergen Reactions   ??? Asa-Acetaminophen-Caff-Potass Other (comments)     Bleeding in stomach   ??? Benadryl [Diphenhydramine Hcl] Other (comments)     Muscle jerking   ??? Other Plant, Animal, Environmental Not Reported This Time     Grass, Dust and Mold    ??? Pollen Extracts Not Reported This Time     Current Outpatient Prescriptions   Medication Sig Dispense Refill   ??? valsartan-hydroCHLOROthiazide (DIOVAN-HCT) 160-25 mg per tablet take 1 tablet by mouth daily 30 Tab 3   ??? montelukast (SINGULAIR) 10 mg tablet Take 1 Tab by mouth nightly as needed. Indications: asthma 90 Tab 1   ??? levothyroxine (SYNTHROID) 75 mcg tablet take 1 tablet by mouth every morning ON AN EMPTY STOMACH 30 Tab 3   ??? rosuvastatin (CRESTOR) 40 mg tablet Take 1 Tab by mouth daily. 90 Tab 1   ??? gabapentin (NEURONTIN) 300 mg capsule take 1 capsule by mouth every morning and 2 capsules by mouth at bedtime 180 Cap 1   ??? omeprazole (PRILOSEC) 40 mg capsule take 1 capsule by mouth once daily BEFORE BREAKFAST 90 Cap 1   ??? ranitidine (ZANTAC) 150 mg tablet Take 1 Tab by mouth two (2) times a day. 180 Tab 1   ??? levothyroxine (SYNTHROID) 75 mcg tablet Take 1 Tab by mouth Daily (before breakfast). 90 Tab 1   ??? rosuvastatin (CRESTOR) 40 mg tablet take 1 tablet by mouth once daily 30 Tab 3   ??? cyclobenzaprine (FLEXERIL)  10 mg tablet Take 0.5 Tabs by mouth two (2) times a day. 60 Tab 0   ??? predniSONE (DELTASONE) 20 mg tablet Take 1 Tab by mouth daily (with breakfast). 10 Tab 0   ??? triamcinolone acetonide (KENALOG) 0.1 % topical cream Apply  to affected area two (2) times a day. use thin layer 60 g 0   ??? gabapentin (NEURONTIN) 300 mg capsule take 1 capsule by mouth every morning and 2 at bedtime 90 Cap 3   ??? omeprazole (PRILOSEC) 40 mg capsule TAKE 1 CAPSULE BY MOUTH ONCE A DAY BEFORE BREAKFAST 30 Cap 5   ??? ALPRAZolam (XANAX) 0.5 mg tablet 1 tab PO BID starting 2 days prior to MRI scan  Indications: ANXIETY 6 Tab 0   ??? topiramate (TOPAMAX) 25 mg tablet Take 1 Tab by mouth two (2) times a day. 60 Tab 2   ??? VARICELLA-ZOSTER VACINE LIVE 19,400 unit/0.65 mL susr injection   0   ??? albuterol (PROVENTIL HFA) 90 mcg/actuation inhaler Take 2 Puffs by inhalation every six (6) hours as needed for Wheezing. 1 Inhaler 6    ??? traMADol (ULTRAM) 50 mg tablet Take 1 Tab by mouth every six (6) hours as needed for Pain. Max Daily Amount: 200 mg. 40 Tab 0   ??? budesonide-formoterol (SYMBICORT) 160-4.5 mcg/actuation HFA inhaler Take 2 Puffs by inhalation two (2) times a day. 1 Inhaler 5   ??? IRON DEXTRAN COMPLEX (IRON DEXTRAN IV) by IntraVENous route.     ??? Cholecalciferol, Vitamin D3, (VITAMIN D-3) 5,000 unit Tab Take  by mouth daily.            ROS:  Pertinent as in HPI        Physical Exam:  Visit Vitals   ??? BP 122/59   ??? Pulse 84   ??? Temp 98.4 ??F (36.9 ??C) (Oral)   ??? Resp 18   ??? Ht 5\' 4"  (1.626 m)   ??? Wt 200 lb 6.4 oz (90.9 kg)   ??? SpO2 96%   ??? BMI 34.4 kg/m2     General: a & o x 3, afebrile, interacting appropriately, in no acute distress  HEENT: Posterior pharyngeal erythema noted  Respiratory: symmetrical chest expansion, lung sounds clear bilaterally, good air entry  Cardiovascular: normal S1S2, regular rate and rhythm        Assessment/Plan:    ICD-10-CM ICD-9-CM    1. Upper respiratory tract infection, unspecified type J06.9 465.9 predniSONE (STERAPRED DS) 10 mg dose pack      levoFLOXacin (LEVAQUIN) 500 mg tablet   2. Cough R05 786.2 chlorpheniramine-HYDROcodone (TUSSIONEX) 10-8 mg/5 mL suspension           Orders Placed This Encounter   ??? predniSONE (STERAPRED DS) 10 mg dose pack     Sig: See administration instruction per 10mg  dose pack     Dispense:  21 Tab     Refill:  0   ??? chlorpheniramine-HYDROcodone (TUSSIONEX) 10-8 mg/5 mL suspension     Sig: Take 5 mL by mouth every twelve (12) hours as needed for Cough. Max Daily Amount: 10 mL.     Dispense:  240 mL     Refill:  0   ??? levoFLOXacin (LEVAQUIN) 500 mg tablet     Sig: Take 1 Tab by mouth daily for 10 days.     Dispense:  10 Tab     Refill:  0           Additional Notes: Discussed today's diagnosis,  treatment plans. Discussed medication indications and side effects.    Weight Management:Counseled  After Visit Summary: Discussed provided printed patient instructions.  Answered questions accordingly.  Follow-up Disposition: As previously scheduled          Osie Cheeks, DO, MPH  Internal Medicine

## 2016-02-06 NOTE — Patient Instructions (Addendum)
A Healthy Lifestyle: Care Instructions  Your Care Instructions  A healthy lifestyle can help you feel good, stay at a healthy weight, and have plenty of energy for both work and play. A healthy lifestyle is something you can share with your whole family.  A healthy lifestyle also can lower your risk for serious health problems, such as high blood pressure, heart disease, and diabetes.  You can follow a few steps listed below to improve your health and the health of your family.  Follow-up care is a key part of your treatment and safety. Be sure to make and go to all appointments, and call your doctor if you are having problems. It???s also a good idea to know your test results and keep a list of the medicines you take.  How can you care for yourself at home?  ?? Do not eat too much sugar, fat, or fast foods. You can still have dessert and treats now and then. The goal is moderation.  ?? Start small to improve your eating habits. Pay attention to portion sizes, drink less juice and soda pop, and eat more fruits and vegetables.  ?? Eat a healthy amount of food. A 3-ounce serving of meat, for example, is about the size of a deck of cards. Fill the rest of your plate with vegetables and whole grains.  ?? Limit the amount of soda and sports drinks you have every day. Drink more water when you are thirsty.  ?? Eat at least 5 servings of fruits and vegetables every day. It may seem like a lot, but it is not hard to reach this goal. A serving or helping is 1 piece of fruit, 1 cup of vegetables, or 2 cups of leafy, raw vegetables. Have an apple or some carrot sticks as an afternoon snack instead of a candy bar. Try to have fruits and/or vegetables at every meal.  ?? Make exercise part of your daily routine. You may want to start with simple activities, such as walking, bicycling, or slow swimming. Try to be active 30 to 60 minutes every day. You do not need to do all 30 to 60  minutes all at once. For example, you can exercise 3 times a day for 10 or 20 minutes. Moderate exercise is safe for most people, but it is always a good idea to talk to your doctor before starting an exercise program.  ?? Keep moving. Mow the lawn, work in the garden, or clean your house. Take the stairs instead of the elevator at work.  ?? If you smoke, quit. People who smoke have an increased risk for heart attack, stroke, cancer, and other lung illnesses. Quitting is hard, but there are ways to boost your chance of quitting tobacco for good.  ?? Use nicotine gum, patches, or lozenges.  ?? Ask your doctor about stop-smoking programs and medicines.  ?? Keep trying.  In addition to reducing your risk of diseases in the future, you will notice some benefits soon after you stop using tobacco. If you have shortness of breath or asthma symptoms, they will likely get better within a few weeks after you quit.  ?? Limit how much alcohol you drink. Moderate amounts of alcohol (up to 2 drinks a day for men, 1 drink a day for women) are okay. But drinking too much can lead to liver problems, high blood pressure, and other health problems.  Family health  If you have a family, there are many things you can do   together to improve your health.  ?? Eat meals together as a family as often as possible.  ?? Eat healthy foods. This includes fruits, vegetables, lean meats and dairy, and whole grains.  ?? Include your family in your fitness plan. Most people think of activities such as jogging or tennis as the way to fitness, but there are many ways you and your family can be more active. Anything that makes you breathe hard and gets your heart pumping is exercise. Here are some tips:  ?? Walk to do errands or to take your child to school or the bus.  ?? Go for a family bike ride after dinner instead of watching TV.  Where can you learn more?  Go to [employer]rest[employer].   Enter [phone] in the search box to learn more about "A Healthy Lifestyle: Care Instructions."  Current as of: July 02, 2015  Content Version: 11.1  ?? 2006-2016 [employer]. Care instructions adapted under license by [employer] (which disclaims liability or warranty for this information). If you have questions about a medical condition or this instruction, always ask your healthcare professional. [place] any warranty or liability for your use of this information.       Upper Respiratory Infection (Cold): Care Instructions  Your Care Instructions    An upper respiratory infection, or URI, is an infection of the nose, sinuses, or throat. URIs are spread by coughs, sneezes, and direct contact. The common cold is the most frequent kind of URI. The flu and sinus infections are other kinds of URIs.  Almost all URIs are caused by viruses. Antibiotics won't cure them. But you can treat most infections with home care. This may include drinking lots of fluids and taking over-the-counter pain medicine. You will probably feel better in 4 to 10 days.  The doctor has checked you carefully, but problems can develop later. If you notice any problems or new symptoms, get medical treatment right away.  Follow-up care is a key part of your treatment and safety. Be sure to make and go to all appointments, and call your doctor if you are having problems. It's also a good idea to know your test results and keep a list of the medicines you take.  How can you care for yourself at home?  ?? To prevent dehydration, drink plenty of fluids, enough so that your urine is light yellow or clear like water. Choose water and other caffeine-free clear liquids until you feel better. If you have kidney, heart, or liver disease and have to limit fluids, talk with your doctor before you increase the amount of fluids you drink.  ?? Take an over-the-counter pain medicine, such as acetaminophen (Tylenol),  ibuprofen (Advil, Motrin), or naproxen (Aleve). Read and follow all instructions on the label.  ?? Before you use cough and cold medicines, check the label. These medicines may not be safe for young children or for people with certain health problems.  ?? Be careful when taking over-the-counter cold or flu medicines and Tylenol at the same time. Many of these medicines have acetaminophen, which is Tylenol. Read the labels to make sure that you are not taking more than the recommended dose. Too much acetaminophen (Tylenol) can be harmful.  ?? Get plenty of rest.  ?? Do not smoke or allow others to smoke around you. If you need help quitting, talk to your doctor about stop-smoking programs and medicines. These can increase your chances of quitting for good.  When should  you call for help?  Call 911 anytime you think you may need emergency care. For example, call if:  ?? You have severe trouble breathing.  Call your doctor now or seek immediate medical care if:  ?? You seem to be getting much sicker.  ?? You have new or worse trouble breathing.  ?? You have a new or higher fever.  ?? You have a new rash.  Watch closely for changes in your health, and be sure to contact your doctor if:  ?? You have a new symptom, such as a sore throat, an earache, or sinus pain.  ?? You cough more deeply or more often, especially if you notice more mucus or a change in the color of your mucus.  ?? You do not get better as expected.  Where can you learn more?  Go to [employer].  Enter [phone] in the search box to learn more about "Upper Respiratory Infection (Cold): Care Instructions."  Current as of: June 06, 2015  Content Version: 11.1  ?? 2006-2016 [employer]. Care instructions adapted under license by [employer] (which disclaims liability or warranty for this information). If you have questions about a medical condition or  this instruction, always ask your healthcare professional. [place] any warranty or liability for your use of this information.

## 2016-02-06 NOTE — Progress Notes (Signed)
Pt is here for sore throat, cold & cough x 2 weeks.     Do you have an advance directive no  Request Pt bring a copy of advance directive for scanning.  Do you want information on an advance directive no    Pt mychart activation pending.     1. Have you been to the ER, urgent care clinic since your last visit?  Hospitalized since your last visit?No    2. Have you seen or consulted any other health care providers outside of the [facility] since your last visit?  Include any pap smears or colon screening. No

## 2016-02-07 MED ORDER — AMOXICILLIN 875 MG TAB
875 mg | ORAL_TABLET | Freq: Two times a day (BID) | ORAL | 0 refills | Status: AC
Start: 2016-02-07 — End: 2016-02-17

## 2016-02-07 NOTE — Telephone Encounter (Signed)
Can another provider please review and help with this request       Pt calling back re: said the nurse advised her she would have to wait until Monday when Dr Elaine [name] returned to be to review and prescribe a new medication.    Pt said "don't understand, I am supposed to take nothing for my infection until the doctor returns"    Request return call asap

## 2016-02-07 NOTE — Telephone Encounter (Signed)
Pt requesting a different antibiotic after readings the effects of the curently prescribed medication.

## 2016-02-07 NOTE — Telephone Encounter (Signed)
Informed the patient that all medications comes with side effects, she stated that she is aware of that but she prefers not to take the levaquin.

## 2016-02-07 NOTE — Telephone Encounter (Signed)
Called the pt, no answer. Left her a message that med request was approved.

## 2016-02-07 NOTE — Telephone Encounter (Signed)
Patient is aware medication has been sent to the pharmacy.

## 2016-02-07 NOTE — Telephone Encounter (Signed)
med approved and sent electronically

## 2016-02-17 ENCOUNTER — Inpatient Hospital Stay: Admit: 2016-02-17 | Primary: Internal Medicine

## 2016-02-18 LAB — METABOLIC PANEL, COMPREHENSIVE
A-G Ratio: 1.5 (ref 1.2–2.2)
ALT (SGPT): 18 IU/L (ref 0–32)
AST (SGOT): 21 IU/L (ref 0–40)
Albumin: 4.1 g/dL (ref 3.6–4.8)
Alk. phosphatase: 63 IU/L (ref 39–117)
BUN/Creatinine ratio: 15 (ref 11–26)
BUN: 15 mg/dL (ref 8–27)
Bilirubin, total: 0.4 mg/dL (ref 0.0–1.2)
CO2: 28 mmol/L (ref 18–29)
Calcium: 9.5 mg/dL (ref 8.7–10.3)
Chloride: 96 mmol/L (ref 96–106)
Creatinine: 1 mg/dL (ref 0.57–1.00)
GFR est AA: 68 mL/min/{1.73_m2} (ref >59)
GFR est non-AA: 59 mL/min/{1.73_m2} — ABNORMAL LOW (ref >59)
GLOBULIN, TOTAL: 2.7 g/dL (ref 1.5–4.5)
Glucose: 100 mg/dL — ABNORMAL HIGH (ref 65–99)
Potassium: 3.6 mmol/L (ref 3.5–5.2)
Protein, total: 6.8 g/dL (ref 6.0–8.5)
Sodium: 140 mmol/L (ref 134–144)

## 2016-02-18 LAB — CKD REPORT

## 2016-02-18 LAB — CBC WITH AUTOMATED DIFF
ABS. BASOPHILS: 0 10*3/uL (ref 0.0–0.2)
ABS. EOSINOPHILS: 0.2 10*3/uL (ref 0.0–0.4)
ABS. IMM. GRANS.: 0 10*3/uL (ref 0.0–0.1)
ABS. MONOCYTES: 0.8 10*3/uL (ref 0.1–0.9)
ABS. NEUTROPHILS: 4.5 10*3/uL (ref 1.4–7.0)
Abs Lymphocytes: 1.4 10*3/uL (ref 0.7–3.1)
BASOPHILS: 0 %
EOSINOPHILS: 2 %
HCT: 32.9 % — ABNORMAL LOW (ref 34.0–46.6)
HGB: 10.6 g/dL — ABNORMAL LOW (ref 11.1–15.9)
IMMATURE GRANULOCYTES: 0 %
Lymphocytes: 20 %
MCH: 25.2 pg — ABNORMAL LOW (ref 26.6–33.0)
MCHC: 32.2 g/dL (ref 31.5–35.7)
MCV: 78 fL — ABNORMAL LOW (ref 79–97)
MONOCYTES: 11 %
NEUTROPHILS: 67 %
PLATELET: 260 10*3/uL (ref 150–379)
RBC: 4.21 x10E6/uL (ref 3.77–5.28)
RDW: 15.8 % — ABNORMAL HIGH (ref 12.3–15.4)
WBC: 6.9 10*3/uL (ref 3.4–10.8)

## 2016-02-18 LAB — CVD REPORT

## 2016-02-18 LAB — T4, FREE: T4, Free: 0.99 ng/dL (ref 0.82–1.77)

## 2016-02-18 LAB — LIPID PANEL
Cholesterol, total: 156 mg/dL (ref 100–199)
HDL Cholesterol: 49 mg/dL (ref >39)
LDL, calculated: 82 mg/dL (ref 0–99)
Triglyceride: 124 mg/dL (ref 0–149)
VLDL, calculated: 25 mg/dL (ref 5–40)

## 2016-02-18 LAB — VITAMIN D, 25 HYDROXY: VITAMIN D, 25-HYDROXY: 60.5 ng/mL (ref 30.0–100.0)

## 2016-02-18 LAB — TSH 3RD GENERATION: TSH: 28.65 u[IU]/mL — ABNORMAL HIGH (ref 0.450–4.500)

## 2016-02-18 LAB — HEMOGLOBIN A1C W/O EAG: Hemoglobin A1c: 6.2 % — ABNORMAL HIGH (ref 4.8–5.6)

## 2016-02-19 ENCOUNTER — Encounter (HOSPITAL_COMMUNITY): Payer: Self-pay | Admitting: Emergency Medicine

## 2016-02-19 ENCOUNTER — Emergency Department (HOSPITAL_COMMUNITY)
Admission: EM | Admit: 2016-02-19 | Discharge: 2016-02-19 | Disposition: A | Discharge status: Home / Self Care | Payer: Medicare Other | Attending: Emergency Medicine | Admitting: Emergency Medicine

## 2016-02-19 ENCOUNTER — Ambulatory Visit
Admit: 2016-02-19 | Discharge: 2016-02-19 | Payer: PRIVATE HEALTH INSURANCE | Attending: Internal Medicine | Primary: Internal Medicine

## 2016-02-19 DIAGNOSIS — Z0000 Encounter for general adult medical examination without abnormal findings: Secondary | ICD-10-CM

## 2016-02-19 DIAGNOSIS — Z8719 Personal history of other diseases of the digestive system: Secondary | ICD-10-CM

## 2016-02-19 DIAGNOSIS — I10 Essential (primary) hypertension: Secondary | ICD-10-CM | POA: Insufficient documentation

## 2016-02-19 DIAGNOSIS — K219 Gastro-esophageal reflux disease without esophagitis: Secondary | ICD-10-CM | POA: Diagnosis not present

## 2016-02-19 DIAGNOSIS — Z7982 Long term (current) use of aspirin: Secondary | ICD-10-CM | POA: Diagnosis not present

## 2016-02-19 DIAGNOSIS — Z79899 Other long term (current) drug therapy: Secondary | ICD-10-CM | POA: Diagnosis not present

## 2016-02-19 DIAGNOSIS — E7800 Pure hypercholesterolemia, unspecified: Secondary | ICD-10-CM | POA: Diagnosis not present

## 2016-02-19 DIAGNOSIS — R1013 Epigastric pain: Secondary | ICD-10-CM | POA: Insufficient documentation

## 2016-02-19 HISTORY — DX: Essential (primary) hypertension: I10

## 2016-02-19 HISTORY — DX: Pure hypercholesterolemia, unspecified: E78.00

## 2016-02-19 HISTORY — DX: Gastro-esophageal reflux disease without esophagitis: K21.9

## 2016-02-19 LAB — URINALYSIS, ROUTINE W REFLEX MICROSCOPIC
Bilirubin Urine: NEGATIVE
GLUCOSE, UA: NEGATIVE mg/dL
KETONES UR: NEGATIVE mg/dL
NITRITE: NEGATIVE
PROTEIN: NEGATIVE mg/dL
Specific Gravity, Urine: 1.013 (ref 1.005–1.030)
pH: 5 (ref 5.0–8.0)

## 2016-02-19 LAB — CBC
HCT: 36.3 % (ref 36.0–46.0)
Hemoglobin: 11.6 g/dL — ABNORMAL LOW (ref 12.0–15.0)
MCH: 29.4 pg (ref 26.0–34.0)
MCHC: 32 g/dL (ref 30.0–36.0)
MCV: 91.9 fL (ref 78.0–100.0)
PLATELETS: 270 10*3/uL (ref 150–400)
RBC: 3.95 MIL/uL (ref 3.87–5.11)
RDW: 14.2 % (ref 11.5–15.5)
WBC: 8.3 10*3/uL (ref 4.0–10.5)

## 2016-02-19 LAB — COMPREHENSIVE METABOLIC PANEL
ALK PHOS: 57 U/L (ref 38–126)
ALT: 18 U/L (ref 14–54)
AST: 21 U/L (ref 15–41)
Albumin: 3.7 g/dL (ref 3.5–5.0)
Anion gap: 13 (ref 5–15)
BILIRUBIN TOTAL: 0.4 mg/dL (ref 0.3–1.2)
BUN: 12 mg/dL (ref 6–20)
CALCIUM: 9.2 mg/dL (ref 8.9–10.3)
CO2: 24 mmol/L (ref 22–32)
Chloride: 103 mmol/L (ref 101–111)
Creatinine, Ser: 1.04 mg/dL — ABNORMAL HIGH (ref 0.44–1.00)
GFR calc Af Amer: >60 mL/min (ref >60)
GFR calc non Af Amer: 55 mL/min — ABNORMAL LOW (ref >60)
Glucose, Bld: 150 mg/dL — ABNORMAL HIGH (ref 65–99)
Potassium: 3.7 mmol/L (ref 3.5–5.1)
Sodium: 140 mmol/L (ref 135–145)
TOTAL PROTEIN: 7.1 g/dL (ref 6.5–8.1)

## 2016-02-19 LAB — URINE MICROSCOPIC-ADD ON

## 2016-02-19 LAB — LIPASE, BLOOD: LIPASE: 28 U/L (ref 11–51)

## 2016-02-19 MED ORDER — LEVOTHYROXINE 112 MCG TAB
112 mcg | ORAL_TABLET | Freq: Every day | ORAL | 3 refills | Status: DC
Start: 2016-02-19 — End: 2016-10-20

## 2016-02-19 MED ORDER — PANTOPRAZOLE SODIUM 40 MG PO TBEC
40.0000 mg | DELAYED_RELEASE_TABLET | Freq: Every day | ORAL | Status: DC
  Administered 2016-02-19: 40 mg via ORAL
  Filled 2016-02-19: qty 1

## 2016-02-19 MED ORDER — GI COCKTAIL ~~LOC~~: 30.0000 mL | Freq: Once | ORAL | Status: DC

## 2016-02-19 NOTE — Progress Notes (Signed)
Chief Complaint   Patient presents with   ??? 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 Also as part of the requirement, she will be having PPD placed. She has been feeling well and voices no complaints today. She is complaint with her medications with no adverse effects reported.      Past Medical History:   Diagnosis Date   ??? Anemia    ??? Asthma    ??? Colon polyp    ??? DDD (degenerative disc disease) 02/15/2009   ??? Depression 12/18/2011   ??? Essential hypertension, benign    ??? History of echocardiogram 03/20/2004    EF 70%.  Borderline DDfx.  No significant valvular pathology.   ??? Hypercholesterolemia    ??? Hypertension    ??? Iron deficiency anemia 99991111   ??? 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 Lower extremity venous duplex 10/23/2009    Left leg:  No DVT.     ??? Meralgia paraesthetica 10/22/2013   ??? OA (osteoarthritis of spine) 02/15/2009   ??? OA (osteoarthritis of spine) 02/15/2009   ??? Obesity, unspecified    ??? Other and unspecified hyperlipidemia    ??? Pre-operative cardiovascular examination     For spine surgery   ??? Right sided sciatica    ??? Shortness of breath     Possible asthma, HCVD; less likely CAD (Noted 03/15/09)   ??? Sleep apnea 02/15/2009    uses cpap machine   ??? Thallium stress test  03/20/2004    Partially transient, mod basal & mid anterior defect most c/w artifact; mild anterior ischemia less likely.  Neg EKG on pharm stress test.   ??? Thyroid disease     hypothyroidism     Allergies   Allergen Reactions   ??? Asa-Acetaminophen-Caff-Potass Other (comments)     Bleeding in stomach   ??? Benadryl [Diphenhydramine Hcl] Other (comments)     Muscle jerking   ??? Other Plant, Animal, Environmental Not Reported This Time     Grass, Dust and Mold   ??? Pollen Extracts Not Reported This Time      Current Outpatient Prescriptions   Medication Sig Dispense Refill   ??? predniSONE (STERAPRED DS) 10 mg dose pack See administration instruction per 10mg  dose pack 21 Tab 0   ??? chlorpheniramine-HYDROcodone (TUSSIONEX) 10-8 mg/5 mL suspension Take 5 mL by mouth every twelve (12) hours as needed for Cough. Max Daily Amount: 10 mL. 240 mL 0   ??? levothyroxine (SYNTHROID) 75 mcg tablet Take 1 Tab by mouth Daily (before breakfast). 90 Tab 1   ??? valsartan-hydroCHLOROthiazide (DIOVAN-HCT) 160-25 mg per tablet take 1 tablet by mouth daily 30 Tab 3   ??? rosuvastatin (CRESTOR) 40 mg tablet take 1 tablet by mouth once daily 30 Tab 3   ??? montelukast (SINGULAIR) 10 mg tablet Take 1 Tab by mouth nightly as needed. Indications: asthma 90 Tab 1   ??? cyclobenzaprine (FLEXERIL) 10 mg tablet Take 0.5 Tabs by mouth two (2) times a day. 60 Tab 0   ??? predniSONE (DELTASONE) 20 mg tablet Take 1 Tab by mouth daily (with breakfast). 10 Tab 0   ??? triamcinolone acetonide (KENALOG) 0.1 % topical cream Apply  to affected area two (2) times a day. use thin layer  60 g 0   ??? gabapentin (NEURONTIN) 300 mg capsule take 1 capsule by mouth every morning and 2 at bedtime 90 Cap 3   ??? omeprazole (PRILOSEC) 40 mg capsule TAKE 1 CAPSULE BY MOUTH ONCE A DAY BEFORE BREAKFAST 30 Cap 5   ??? levothyroxine (SYNTHROID) 75 mcg tablet take 1 tablet by mouth every morning ON AN EMPTY STOMACH 30 Tab 3   ??? ALPRAZolam (XANAX) 0.5 mg tablet 1 tab PO BID starting 2 days prior to MRI scan  Indications: ANXIETY 6 Tab 0   ??? topiramate (TOPAMAX) 25 mg tablet Take 1 Tab by mouth two (2) times a day. 60 Tab 2   ??? VARICELLA-ZOSTER VACINE LIVE 19,400 unit/0.65 mL susr injection   0   ??? rosuvastatin (CRESTOR) 40 mg tablet Take 1 Tab by mouth daily. 90 Tab 1   ??? gabapentin (NEURONTIN) 300 mg capsule take 1 capsule by mouth every morning and 2 capsules by mouth at bedtime 180 Cap 1   ??? omeprazole (PRILOSEC) 40 mg capsule take 1 capsule by mouth once daily BEFORE BREAKFAST 90 Cap 1    ??? ranitidine (ZANTAC) 150 mg tablet Take 1 Tab by mouth two (2) times a day. 180 Tab 1   ??? albuterol (PROVENTIL HFA) 90 mcg/actuation inhaler Take 2 Puffs by inhalation every six (6) hours as needed for Wheezing. 1 Inhaler 6   ??? traMADol (ULTRAM) 50 mg tablet Take 1 Tab by mouth every six (6) hours as needed for Pain. Max Daily Amount: 200 mg. 40 Tab 0   ??? budesonide-formoterol (SYMBICORT) 160-4.5 mcg/actuation HFA inhaler Take 2 Puffs by inhalation two (2) times a day. 1 Inhaler 5   ??? IRON DEXTRAN COMPLEX (IRON DEXTRAN IV) by IntraVENous route.     ??? Cholecalciferol, Vitamin D3, (VITAMIN D-3) 5,000 unit Tab Take  by mouth daily.            ROS:  Constitutional: Negative for fever, chills, or fatigue  Neurological: Negative for headache, dizziness, visual disturbance, or loss of consciousness  Respiratory: Negative for SOB, hemoptysis, or wheezing  Cardiovascular: Negative for chest pain, palpitation, or leg swelling  Gastrointestinal: Negative for abdominal pain, nausea, vomiting, diarrhea, blood in stool, melena, or heartburn  Musculoskeletal: Negative for falls        Physical Exam:  Visit Vitals   ??? BP 112/60 (BP 1 Location: Left arm, BP Patient Position: Sitting)   ??? Pulse 78   ??? Resp 18   ??? Ht 5\' 4"  (1.626 m)   ??? Wt 201 lb (91.2 kg)   ??? SpO2 98%   ??? BMI 34.5 kg/m2     General: a & o x 3, afebrile, well-nourished, interacting appropriately, in no acute distress  Skin: warm and dry, no rashes , no bruises  Neck: supple, symmetrical, no thyromegaly  Respiratory: symmetrical chest expansion, lung sounds clear bilaterally, good air entry  Cardiovascular: normal S1S2, regular rate and rhythm, no murmurs, pulses palpable, no thrill, no carotid or abdominal bruits, no JVD  Abdomen: non-distended, normoactive bowel sounds x 4 quadrants, soft, non-tender to palpation  Musculoskeletal: normal ROM on all joints, no swelling or deformity, no perilumbar tenderness, steady gait  Psychiatry: Appropriate mood and affect    Extremity: No edema noted          Assessment/Plan:    ICD-10-CM ICD-9-CM    1. Routine general medical examination at a health care facility Z00.00 V70.0 Complete physical was completed at the office today.  2. HTN (hypertension), benign I10 401.1 Well controlled  Continue current meds and she was counseled on low salt diet.   3. Hypercholesteremia E78.00 272.0 Recent lipid panel done 02/17/16 reviewed with pt and revealed Cho 156 and LDL 82.  Continue Crestor and she was counseled on low fat diet.   4. Pre-diabetes R73.03 790.29 Recent HBA1C is 6.2.  Counseled on diet and exercise.  HEMOGLOBIN A1C W/O EAG   5. Glaucoma screening Z13.5 V80.1 REFERRAL TO OPHTHALMOLOGY   6. 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      TSH 3RD GENERATION      T4, FREE         Orders Placed This Encounter   ??? PNEUMOCOCCAL CONJ VACCINE 13 VALENT IM   ??? HEMOGLOBIN A1C W/O EAG     Standing Status:   Future     Standing Expiration Date:   02/19/2017   ??? TSH 3RD GENERATION     Standing Status:   Future     Standing Expiration Date:   06/18/2016   ??? T4, FREE     Standing Status:   Future     Standing Expiration Date:   08/17/2016   ??? REFERRAL TO OPHTHALMOLOGY     Referral Priority:   Routine     Referral Type:   Consultation     Referral Reason:   Specialty Services Required     Referred to Provider:   Luther Bradley, MD   ??? levothyroxine (SYNTHROID) 112 mcg tablet     Sig: Take 1 Tab by mouth Daily (before breakfast).     Dispense:  30 Tab     Refill:  3         Recent labs reviewed with pt      Additional Notes: Discussed today's diagnosis, treatment plans. Discussed medication indications and side effects.    Weight Management:Counseled   After Visit Summary: Discussed provided printed patient instructions. Answered questions accordingly.  Follow-up Disposition: In 3 months with labs 1 week prior          Osie Cheeks, DO, MPH  Internal Medicine

## 2016-02-19 NOTE — Patient Instructions (Addendum)
Well Visit, Over 65: Care Instructions  Your Care Instructions  Physical exams can help you stay healthy. Your doctor has checked your overall health and may have suggested ways to take good care of yourself. He or she also may have recommended tests. At home, you can help prevent illness with healthy eating, regular exercise, and other steps.  Follow-up care is a key part of your treatment and safety. Be sure to make and go to all appointments, and call your doctor if you are having problems. It's also a good idea to know your test results and keep a list of the medicines you take.  How can you care for yourself at home?  ?? Reach and stay at a healthy weight. This will lower your risk for many problems, such as obesity, diabetes, heart disease, and high blood pressure.  ?? Get at least 30 minutes of exercise on most days of the week. Walking is a good choice. You also may want to do other activities, such as running, swimming, cycling, or playing tennis or team sports.  ?? Do not smoke. Smoking can make health problems worse. If you need help quitting, talk to your doctor about stop-smoking programs and medicines. These can increase your chances of quitting for good.  ?? Protect your skin from too much sun. When you're outdoors from 10 a.m. to 4 p.m., stay in the shade or cover up with clothing and a hat with a wide brim. Wear sunglasses that block UV rays. Even when it's cloudy, put broad-spectrum sunscreen (SPF 30 or higher) on any exposed skin.  ?? See a dentist one or two times a year for checkups and to have your teeth cleaned.  ?? Wear a seat belt in the car.  ?? Limit alcohol to 2 drinks a day for men and 1 drink a day for women. Too much alcohol can cause health problems.  Follow your doctor's advice about when to have certain tests. These tests can spot problems early.  For men and women  ?? Cholesterol. Your doctor will tell you how often to have this done based  on your overall health and other things that can increase your risk for heart attack and stroke.  ?? Blood pressure. Have your blood pressure checked during a routine doctor visit. Your doctor will tell you how often to check your blood pressure based on your age, your blood pressure results, and other factors.  ?? Diabetes. Ask your doctor whether you should have tests for diabetes.  ?? Vision. Experts recommend that you have yearly exams for glaucoma and other age-related eye problems.  ?? Hearing. Tell your doctor if you notice any change in your hearing. You can have tests to find out how well you hear.  ?? Colon cancer tests. Keep having colon cancer tests as your doctor recommends. You can have one of several types of tests.  ?? Heart attack and stroke risk. At least every 4 to 6 years, you should have your risk for heart attack and stroke assessed. Your doctor uses factors such as your age, blood pressure, cholesterol, and whether you smoke or have diabetes to show what your risk for a heart attack or stroke is over the next 10 years.  ?? Osteoporosis. Talk to your doctor about whether you should have a bone density test to find out whether you have thinning bones. Also ask your doctor about whether you should take calcium and vitamin D supplements.  For women  ?? Pap test   and pelvic exam. You may no longer need a Pap test. Talk with your doctor about whether to stop or continue to have Pap tests.  ?? Breast exam and mammogram. Ask how often you should have a mammogram, which is an X-ray of your breasts. A mammogram can spot breast cancer before it can be felt and when it is easiest to treat.  ?? Thyroid disease. Talk to your doctor about whether to have your thyroid checked as part of a regular physical exam. Women have an increased chance of a thyroid problem.  For men  ?? Prostate exam. Talk to your doctor about whether you should have a blood test (called a PSA test) for prostate cancer. Experts disagree on whether  men should have this test. Some experts recommend that you discuss the benefits and risks of the test with your doctor.  ?? Abdominal aortic aneurysm. Ask your doctor whether you should have a test to check for an aneurysm. You may need a test if you ever smoked or if your parent, brother, sister, or child has had an aneurysm.  When should you call for help?  Watch closely for changes in your health, and be sure to contact your doctor if you have any problems or symptoms that concern you.  Where can you learn more?  Go to [employer].  Enter [phone] in the search box to learn more about "Well Visit, Over 65: Care Instructions."  Current as of: June 25, 2015  Content Version: 11.1  ?? 2006-2016 [employer]. Care instructions adapted under license by [employer] (which disclaims liability or warranty for this information). If you have questions about a medical condition or this instruction, always ask your healthcare professional. [place] any warranty or liability for your use of this information.       DASH Diet: Care Instructions  Your Care Instructions  The DASH diet is an eating plan that can help lower your blood pressure. DASH stands for Dietary Approaches to Stop Hypertension. Hypertension is high blood pressure.  The DASH diet focuses on eating foods that are high in calcium, potassium, and magnesium. These nutrients can lower blood pressure. The foods that are highest in these nutrients are fruits, vegetables, low-fat dairy products, nuts, seeds, and legumes. But taking calcium, potassium, and magnesium supplements instead of eating foods that are high in those nutrients does not have the same effect. The DASH diet also includes whole grains, fish, and poultry.  The DASH diet is one of several lifestyle changes your doctor may recommend to lower your high blood pressure. Your doctor may also want you  to decrease the amount of sodium in your diet. Lowering sodium while following the DASH diet can lower blood pressure even further than just the DASH diet alone.  Follow-up care is a key part of your treatment and safety. Be sure to make and go to all appointments, and call your doctor if you are having problems. It's also a good idea to know your test results and keep a list of the medicines you take.  How can you care for yourself at home?  Following the DASH diet  ?? Eat 4 to 5 servings of fruit each day. A serving is 1 medium-sized piece of fruit, ?? cup chopped or canned fruit, 1/4 cup dried fruit, or 4 ounces (?? cup) of fruit juice. Choose fruit more often than fruit juice.  ?? Eat 4 to 5 servings of vegetables each day. A serving is 1  cup of lettuce or raw leafy vegetables, ?? cup of chopped or cooked vegetables, or 4 ounces (?? cup) of vegetable juice. Choose vegetables more often than vegetable juice.  ?? Get 2 to 3 servings of low-fat and fat-free dairy each day. A serving is 8 ounces of milk, 1 cup of yogurt, or 1 ?? ounces of cheese.  ?? Eat 6 to 8 servings of grains each day. A serving is 1 slice of bread, 1 ounce of dry cereal, or ?? cup of cooked rice, pasta, or cooked cereal. Try to choose whole-grain products as much as possible.  ?? Limit lean meat, poultry, and fish to 2 servings each day. A serving is 3 ounces, about the size of a deck of cards.  ?? Eat 4 to 5 servings of nuts, seeds, and legumes (cooked dried beans, lentils, and split peas) each week. A serving is 1/3 cup of nuts, 2 tablespoons of seeds, or ?? cup of cooked beans or peas.  ?? Limit fats and oils to 2 to 3 servings each day. A serving is 1 teaspoon of vegetable oil or 2 tablespoons of salad dressing.  ?? Limit sweets and added sugars to 5 servings or less a week. A serving is 1 tablespoon jelly or jam, ?? cup sorbet, or 1 cup of lemonade.  ?? Eat less than 2,300 milligrams (mg) of sodium a day. If you limit your  sodium to 1,500 mg a day, you can lower your blood pressure even more.  Tips for success  ?? Start small. Do not try to make dramatic changes to your diet all at once. You might feel that you are missing out on your favorite foods and then be more likely to not follow the plan. Make small changes, and stick with them. Once those changes become habit, add a few more changes.  ?? Try some of the following:  ?? Make it a goal to eat a fruit or vegetable at every meal and at snacks. This will make it easy to get the recommended amount of fruits and vegetables each day.  ?? Try yogurt topped with fruit and nuts for a snack or healthy dessert.  ?? Add lettuce, tomato, cucumber, and onion to sandwiches.  ?? Combine a ready-made pizza crust with low-fat mozzarella cheese and lots of vegetable toppings. Try using tomatoes, squash, spinach, broccoli, carrots, cauliflower, and onions.  ?? Have a variety of cut-up vegetables with a low-fat dip as an appetizer instead of chips and dip.  ?? Sprinkle sunflower seeds or chopped almonds over salads. Or try adding chopped walnuts or almonds to cooked vegetables.  ?? Try some vegetarian meals using beans and peas. Add garbanzo or kidney beans to salads. Make burritos and tacos with mashed pinto beans or black beans.  Where can you learn more?  Go to [employer].  Enter [phone] in the search box to learn more about "DASH Diet: Care Instructions."  Current as of: February 27, 2015  Content Version: 11.1  ?? 2006-2016 [employer], [employer]. Care instructions adapted under license by [employer] (which disclaims liability or warranty for this information). If you have questions about a medical condition or this instruction, always ask your healthcare professional. [place] any warranty or liability for your use of this information.       Prediabetes: Care Instructions  Your Care Instructions   Prediabetes is a warning sign that you are at risk for getting type 2 diabetes. It means that your blood sugar is  higher than it should be. The food you eat turns into sugar, which your body uses for energy. Normally, an organ called the pancreas makes insulin, which allows the sugar in your blood to get into your body's cells. But when your body can't use insulin the right way, the sugar doesn't move into cells. It stays in your blood instead. This is called insulin resistance. The buildup of sugar in the blood causes prediabetes.  The good news is that lifestyle changes may help you get your blood sugar back to normal and help you avoid or delay diabetes.  Follow-up care is a key part of your treatment and safety. Be sure to make and go to all appointments, and call your doctor if you are having problems. It's also a good idea to know your test results and keep a list of the medicines you take.  How can you care for yourself at home?  ?? Watch your weight. A healthy weight helps your body use insulin properly.  ?? Limit the amount of calories, sweets, and unhealthy fat you eat. Ask your doctor if you should see a dietitian. A registered dietitian can help you create meal plans that fit your lifestyle.  ?? Get at least 30 minutes of exercise on most days of the week. Exercise helps control your blood sugar. It also helps you maintain a healthy weight. Walking is a good choice. You also may want to do other activities, such as running, swimming, cycling, or playing tennis or team sports.  ?? Do not smoke. Smoking can make prediabetes worse. If you need help quitting, talk to your doctor about stop-smoking programs and medicines. These can increase your chances of quitting for good.  ?? If your doctor prescribed medicines, take them exactly as prescribed. Call your doctor if you think you are having a problem with your medicine. You will get more details on the specific medicines your doctor prescribes.   When should you call for help?  Watch closely for changes in your health, and be sure to contact your doctor if:  ?? You have any symptoms of diabetes. These may include:  ?? Being thirsty more often.  ?? Urinating more.  ?? Being hungrier.  ?? Losing weight.  ?? Being very tired.  ?? Having blurry vision.  ?? You have a wound that will not heal.  ?? You have an infection that will not go away.  ?? You have problems with your blood pressure.  ?? You want more information about diabetes and how you can keep from getting it.  Where can you learn more?  Go to [employer].  Enter I222 in the search box to learn more about "Prediabetes: Care Instructions."  Current as of: Apr 29, 2015  Content Version: 11.1  ?? 2006-2016 [employer], [employer]. Care instructions adapted under license by [employer] (which disclaims liability or warranty for this information). If you have questions about a medical condition or this instruction, always ask your healthcare professional. [place] any warranty or liability for your use of this information.       Hypothyroidism: Care Instructions  Your Care Instructions  You have hypothyroidism, which means that your body is not making enough thyroid hormone. This hormone helps your body use energy. If your thyroid level is low, you may feel tired, be constipated, have an increase in your blood pressure, or have dry skin or memory problems. You may also get cold easily, even when it is warm. Women with  low thyroid levels may have heavy menstrual periods.  A blood test to find your thyroid-stimulating hormone (TSH) level is used to check for hypothyroidism. A high TSH level may mean that you have low thyroid. When your body is not making enough thyroid hormone, TSH levels rise in an effort to make the body produce more.  The treatment for hypothyroidism is to take thyroid hormone pills. You  should start to feel better in 1 to 2 weeks. But it can take several months to see changes in the TSH level. You will need regular visits with your doctor to make sure you have the right dose of medicine.  Most people need treatment for the rest of their lives. You will need to see your doctor regularly to have blood tests and to make sure you are doing well.  Follow-up care is a key part of your treatment and safety. Be sure to make and go to all appointments, and call your doctor if you are having problems. It???s also a good idea to know your test results and keep a list of the medicines you take.  How can you care for yourself at home?  ?? Take your thyroid hormone medicine exactly as prescribed. Call your doctor if you think you are having a problem with your medicine. Most people do not have side effects if they take the right amount of medicine regularly.  ?? Take the medicine 30 minutes before breakfast, and do not take it with calcium, vitamins, or iron.  ?? Do not take extra doses of your thyroid medicine. It will not help you get better any faster, and it may cause side effects.  ?? If you forget to take a dose, do NOT take a double dose of medicine. Take your usual dose the next day.  ?? Tell your doctor about all prescription, herbal, or over-the-counter products you take.  ?? Take care of yourself. Eat a healthy diet, get enough sleep, and get regular exercise.  When should you call for help?  Call 911 anytime you think you may need emergency care. For example, call if:  ?? You passed out (lost consciousness).  ?? You have severe trouble breathing.  ?? You have a very slow heartbeat (less than 60 beats a minute).  ?? You have a low body temperature (95??F or below).  Call your doctor now or seek immediate medical care if:  ?? You feel tired, sluggish, or weak.  ?? You have trouble remembering things or concentrating.  ?? You do not begin to feel better 2 weeks after starting your medicine.   Watch closely for changes in your health, and be sure to contact your doctor if you have any problems.  Where can you learn more?  Go to [employer].  Enter [phone] in the search box to learn more about "Hypothyroidism: Care Instructions."  Current as of: July 04, 2015  Content Version: 11.1  ?? 2006-2016 Healthwise, [employer]. Care instructions adapted under license by [employer] (which disclaims liability or warranty for this information). If you have questions about a medical condition or this instruction, always ask your healthcare professional. [place] any warranty or liability for your use of this information.

## 2016-02-19 NOTE — Progress Notes (Signed)
Chief Complaint   Patient presents with   ??? Complete Physical     Established with Dr Vonzell Schlatter and has a follow up with him end of April.  Colonoscopy completed last year with dr Angelique Holm.    1. Have you been to the ER, urgent care clinic since your last visit?  Hospitalized since your last visit?No    2. Have you seen or consulted any other health care providers outside of the [facility] since your last visit?  Include any pap smears or colon screening. No

## 2016-02-19 NOTE — ED Notes (Signed)
Patient arrives with complaint of abdominal pain and epigastric pain. Onset last night. Denies vomiting, but states "my mouth was watering". States she ate some chocolate yesterday and thinks that may have caused the discomfort.

## 2016-02-19 NOTE — ED Provider Notes (Signed)
CSN: [id number]     Arrival date & time 02/19/16  [id number] History   First MD Initiated Contact with Patient 02/19/16 0830     Chief Complaint  Patient presents with  . Abdominal Pain     (Consider location/radiation/quality/duration/timing/severity/associated sxs/prior Treatment) HPI    Michelle Chavez is a 65 y.o. female, with a history of GERD, presenting to the ED with epigastric pain that began last night. Pt states that her pain feels like her acid reflux. Pt is currently pain-free, but when the pain occurs she rates it at 5/10, burning in nature, nonradiating. Pt states that she takes Prilosec, but only takes it occasional when she thinks she may eat later at night. Pt states her pain began after she ate some chocolate, which she knows is a trigger for her GERD, but adds that she took her Prilosec right before in an attempt to prevent the GERD pain. Pt also reports that her mouth started watering after eating the chocolate, which she states is a typical precursor to her GERD pain. Pt has not taking any other medications for her symptoms. Pt denies fever/chills, N/V/C/D, current abdominal pain, chest pain, or any other complaints.     Past Medical History  Diagnosis Date  . GERD (gastroesophageal reflux disease)   . Hypertension   . Hypercholesteremia    History reviewed. No pertinent past surgical history. No family history on file. Social History  Substance Use Topics  . Smoking status: Never Smoker   . Smokeless tobacco: None  . Alcohol Use: No   OB History    No data available     Review of Systems  Constitutional: Negative for fever, chills and diaphoresis.  Respiratory: Negative for shortness of breath.   Cardiovascular: Negative for chest pain.  Gastrointestinal: Positive for abdominal pain (Epigastric). Negative for nausea, vomiting, diarrhea and constipation.  All other systems reviewed and are negative.     Allergies  Morphine and related  Home Medications    Prior to Admission medications   Medication Sig Start Date End Date Taking? Authorizing Provider  allopurinol (ZYLOPRIM) 100 MG tablet Take 100 mg by mouth daily.   Yes Historical Provider, MD  aspirin 81 MG tablet Take 81 mg by mouth daily.   Yes Historical Provider, MD  Cholecalciferol (VITAMIN D PO) Take 1 tablet by mouth daily.   Yes Historical Provider, MD  lisinopril (PRINIVIL,ZESTRIL) 20 MG tablet Take 20 mg by mouth daily.   Yes Historical Provider, MD  omeprazole (PRILOSEC) 20 MG capsule Take 20 mg by mouth daily as needed (for stomach acid).    Yes Historical Provider, MD  simvastatin (ZOCOR) 10 MG tablet Take 10 mg by mouth daily. 02/14/16  Yes Historical Provider, MD   BP 148/70 mmHg  Pulse 73  Temp(Src) 97.7 F (36.5 C) (Oral)  Resp 14  Ht 5\' 5"  (1.651 m)  Wt 113.853 kg  BMI 41.77 kg/m2  SpO2 100% Physical Exam  Constitutional: She appears well-developed and well-nourished. No distress.  HENT:  Head: Normocephalic and atraumatic.  Eyes: Conjunctivae are normal. Pupils are equal, round, and reactive to light.  Neck: Neck supple.  Cardiovascular: Normal rate, regular rhythm, normal heart sounds and intact distal pulses.   Pulmonary/Chest: Effort normal and breath sounds normal. No respiratory distress.  Abdominal: Soft. Bowel sounds are normal. There is no tenderness. There is no guarding.  Patient's abdomen is completely non-tender.  Musculoskeletal: She exhibits no edema or tenderness.  Lymphadenopathy:    She has no  cervical adenopathy.  Neurological: She is alert.  Skin: Skin is warm and dry. She is not diaphoretic.  Psychiatric: She has a normal mood and affect. Her behavior is normal.  Nursing note and vitals reviewed.   ED Course  Procedures (including critical care time) Labs Review Labs Reviewed  COMPREHENSIVE METABOLIC PANEL - Abnormal; Notable for the following:    Glucose, Bld 150 (*)    Creatinine, Ser 1.04 (*)    GFR calc non Af Amer 55 (*)     All other components within normal limits  CBC - Abnormal; Notable for the following:    Hemoglobin 11.6 (*)    All other components within normal limits  URINALYSIS, ROUTINE W REFLEX MICROSCOPIC (NOT AT [facility]) - Abnormal; Notable for the following:    Color, Urine STRAW (*)    APPearance CLOUDY (*)    Hgb urine dipstick MODERATE (*)    Leukocytes, UA SMALL (*)    All other components within normal limits  URINE MICROSCOPIC-ADD ON - Abnormal; Notable for the following:    Squamous Epithelial / LPF 6-30 (*)    Bacteria, UA MANY (*)    All other components within normal limits  LIPASE, BLOOD    Imaging Review No results found. I have personally reviewed and evaluated these lab results as part of my medical decision-making.   EKG Interpretation None      MDM   Final diagnoses:  Epigastric pain  History of gastroesophageal reflux (GERD)    Michelle Chavez presents with a report of epigastric pain that began last night.  Findings and plan of care discussed with Arby Barrette, MD. Dr. Donnald Garre personally evaluated and examined this patient.   This patient's presentation and story are consistent with GERD exacerbation. Her abdominal exam is benign, she shows no apparent distress, is nontoxic appearing, and has normal vital signs. Presentation does not suggest a surgical abdomen or other similar emergency. Patient was educated on the correct administration of her Prilosec and was advised that it is not an instant medication, rather its effectiveness is based on the use. Patient remained pain-free here in the ED through multiple re-evaluations.Patient advised to follow-up with PCP on this issue. Resource guide given. Return precautions discussed.    Filed Vitals:   02/19/16 0447 02/19/16 0824  BP: 148/71 148/70  Pulse: 83 73  Temp: 97.7 F (36.5 C)   TempSrc: Oral   Resp: 17 14  Height:  (1.651 m)   Weight: 113.853 kg   SpO2: 99% 100%       Anselm Pancoast,  PA-C 02/19/16 1000  Arby Barrette, MD 02/19/16 1542

## 2016-02-19 NOTE — Discharge Instructions (Signed)
You have been seen today for epigastric pain. Remember to take your Prilosec daily, 30 minutes prior to your first meal. The effectiveness of this medication is based on consistent daily use. Follow up with PCP as needed. Return to ED should symptoms worsen.  RESOURCE GUIDE  Chronic Pain Problems: Contact [facility]  [phone] Patients need to be referred by their primary care doctor.  Insufficient Money for Medicine: Contact United Way:  call "211" or [facility] [phone].  No Primary Care Doctor: - Call Health Connect  [phone] - can help you locate a primary care doctor that  accepts your insurance, provides certain services, etc. - Physician Referral Service- [phone]  Agencies that provide inexpensive medical care: - [facility]  [phone] - [facility]  [phone] - [facility]  [phone] - Women's Clinic  [phone] - Planned Parenthood  [phone] Haynes Bast[facility]  [phone]  Medicaid-accepting [facility] Providers: - [facility]- [address], [address]  [phone], Mon-Fri 9am-7pm, Sat 9am-1pm - [facility]- [address], [address] [place]  [phone] - [facility]- [address], [address] [place]  [phone] [facility]- [address]  [phone] - Renaye RakersVeita Bland- [address], [address], [phone]  Only accepts [place] patients after they have their name  applied to their card  Self Pay (no insurance) in [place]: - Sickle Cell Patients: Dr Willey BladeEric Dean, [facility]  [address], [phone] - [facility]- [address]  [phone]       Redge Gainer-     [facility] [place]- [address], [address]       -     [facility]- see information above (Speak to CitigroupPam H if you do not have insurance)       -  [facility]- [address], [phone]       -  [facility]- [address],  [phone]       -  [facility]- [address], [phone]       -  Dr Julio Sickssei-Bonsu-  [address], [address], [place], [phone]       -  [facility]- [address], [phone]       -  [facility]- [address], [phone], also [address], [phone]       -    [facility]- [address], [phone], 1st & 3rd Saturday   every month, 10am-1pm  1) Find a Doctor and Pay Out of Pocket Although you won't have to find out who is covered by your insurance plan, it is a good idea to ask around and get recommendations. You will then need to call the office and see if the doctor you have chosen will accept you as a new patient and what types of options they offer for patients who are self-pay. Some doctors offer discounts or will set up payment plans for their patients who do not have insurance, but you will need to ask so you aren't surprised when you get to your appointment.  2) Contact Your Local Health Department Not all health departments have doctors that can see patients for sick visits, but many do, so it is  worth a call to see if yours does. If you don't know where your local health department is, you can check in your phone book. The CDC also has a tool to help you locate your state's health department, and many state websites also have listings of all of their local health departments.  3) Find a Walk-in Clinic If your illness is not likely to be very severe or complicated, you may want to try a walk in clinic. These are popping up all over the country in pharmacies, drugstores, and shopping centers. They're usually staffed by nurse practitioners or physician assistants that have been trained to treat common illnesses and complaints. They're usually fairly quick and inexpensive. However, if you have serious medical issues or chronic medical problems,  these are probably not your best option  STD Testing - [facility], STD Clinic, [address], [place], phone [phone] or [phone].  Monday - Friday, call for an appointment. [facility] of [employer], STD Clinic, [address], [place], phone [phone] or [phone].  Monday - Friday, call for an appointment.  Abuse/Neglect: [facility] Child Abuse Hotline [phone] [facility] Child Abuse Hotline [phone] (After Hours)  Emergency Shelter:  [facility] 928-066-7330  Maternity Homes: - [facility] [place] [facility] [phone] - [facility] [phone]  MRSA Hotline #:   207-559-7669  [facility] Resources  [facility]  United Way [facility] Dept. [address]                 [address]         [address] [place] [address]  Blondell Reveal Phone:  [phone]                                  Phone:  [phone]                   Phone:  [phone]  [facility], [phone] - [phone]       -     [facility] in [place], [address]                                  [phone], [facility] Child Abuse Hotline 760-368-5644 or 510-110-1079 (After Hours)   Behavioral Health Services  Substance Abuse Resources: - [facility]  661-053-3982 - [facility] [phone] - [facility] [place] [place] [phone] Floydene Flock [phone] - Residential & Outpatient Substance Abuse Program  [phone]  Psychological Services: [facility]  [phone] Services  [phone] - [facility], [phone] [place]. [address] [place],  ACCESS LINE: (416) 722-0262 or 920-809-9843, Entrepreneur[employer]an.[email or web address].za  Dental Assistance  If unable to pay or uninsured, contact:  [facility] or [facility] to become qualified for the adult dental clinic.  Patients with Medicaid: [facility] [phone], [phone] [address], [phone]  If unable to pay, or uninsured, contact [facility] [phone]) or [facility] [phone] in [place], [phone] in [facility]) to become qualified for the adult dental clinic   Other Low-Cost Community Dental Services: - [facility]- [address], [place], [place], [address], [phone], [address]. [phone], 2nd and 4th Thursday of the month at 6:30am.  10 clients each day by appointment, can sometimes see walk-in patients if someone does not show for an appointment. [facility]- [address], [place], [place], [phone], [phone] - [facility]- [address], [place], [place], [address], [phone] - [facility] [facility]- [phone] [facility]- [phone] [facility]- [phone]

## 2016-02-19 NOTE — ED Notes (Signed)
Pt ambulated to room 

## 2016-02-22 ENCOUNTER — Emergency Department (INDEPENDENT_AMBULATORY_CARE_PROVIDER_SITE_OTHER)
Admission: EM | Admit: 2016-02-22 | Discharge: 2016-02-22 | Disposition: A | Discharge status: Home / Self Care | Payer: Medicare Other | Source: Home / Self Care | Attending: Family Medicine | Admitting: Family Medicine

## 2016-02-22 ENCOUNTER — Encounter (HOSPITAL_COMMUNITY): Payer: Self-pay | Admitting: Emergency Medicine

## 2016-02-22 DIAGNOSIS — N939 Abnormal uterine and vaginal bleeding, unspecified: Secondary | ICD-10-CM | POA: Diagnosis not present

## 2016-02-22 LAB — POCT URINALYSIS DIP (DEVICE)
Bilirubin Urine: NEGATIVE
Glucose, UA: NEGATIVE mg/dL
KETONES UR: NEGATIVE mg/dL
Nitrite: NEGATIVE
PH: 5.5 (ref 5.0–8.0)
PROTEIN: NEGATIVE mg/dL
Specific Gravity, Urine: 1.015 (ref 1.005–1.030)
UROBILINOGEN UA: 0.2 mg/dL (ref 0.0–1.0)

## 2016-02-22 LAB — POCT I-STAT, CHEM 8
BUN: 15 mg/dL (ref 6–20)
CALCIUM ION: 1.15 mmol/L (ref 1.13–1.30)
Chloride: 102 mmol/L (ref 101–111)
Creatinine, Ser: 0.9 mg/dL (ref 0.44–1.00)
Glucose, Bld: 93 mg/dL (ref 65–99)
HCT: 41 % (ref 36.0–46.0)
Hemoglobin: 13.9 g/dL (ref 12.0–15.0)
Potassium: 4 mmol/L (ref 3.5–5.1)
SODIUM: 142 mmol/L (ref 135–145)
TCO2: 30 mmol/L (ref 0–100)

## 2016-02-22 NOTE — ED Provider Notes (Signed)
CSN: [id number]     Arrival date & time 02/22/16  1524 History   First MD Initiated Contact with Patient 02/22/16 1651     Chief Complaint  Patient presents with  . Vaginal Bleeding   (Consider location/radiation/quality/duration/timing/severity/associated sxs/prior Treatment) The history is provided by the patient. No language interpreter was used.  Vaginal bleeding: Started yesterday after she urinates and wiped herself, she saw blood on the tissue. Today she saw a small clot one time. Denies dysuria, no change in urine color.She denies belly pain, no nausea or vomiting. She had similar episode just one time long time ago but this is worse than the previous episode. Denies dizziness. No FHx of cancer. LMP was more than 5 yrs ago.  Past Medical History  Diagnosis Date  . GERD (gastroesophageal reflux disease)   . Hypertension   . Hypercholesteremia    History reviewed. No pertinent past surgical history. No family history on file. Social History  Substance Use Topics  . Smoking status: Never Smoker   . Smokeless tobacco: None  . Alcohol Use: No   OB History    No data available     Review of Systems  Respiratory: Negative.   Cardiovascular: Negative.   Gastrointestinal: Negative.   Genitourinary: Positive for vaginal bleeding. Negative for dysuria, vaginal discharge, vaginal pain, menstrual problem and dyspareunia.       Not sexually active  All other systems reviewed and are negative.  Filed Vitals:   02/22/16 1650  BP: 139/82  Pulse: 81  Temp: 98.6 F (37 C)  TempSrc: Oral  SpO2: 100%    Allergies  Morphine and related  Home Medications   Prior to Admission medications   Medication Sig Start Date End Date Taking? Authorizing Provider  allopurinol (ZYLOPRIM) 100 MG tablet Take 100 mg by mouth daily.    Historical Provider, MD  aspirin 81 MG tablet Take 81 mg by mouth daily.    Historical Provider, MD  Cholecalciferol (VITAMIN D PO) Take 1 tablet by mouth  daily.    Historical Provider, MD  lisinopril (PRINIVIL,ZESTRIL) 20 MG tablet Take 20 mg by mouth daily.    Historical Provider, MD  omeprazole (PRILOSEC) 20 MG capsule Take 20 mg by mouth daily as needed (for stomach acid).     Historical Provider, MD  simvastatin (ZOCOR) 10 MG tablet Take 10 mg by mouth daily. 02/14/16   Historical Provider, MD   Meds Ordered and Administered this Visit  Medications - No data to display  BP 139/82 mmHg  Pulse 81  Temp(Src) 98.6 F (37 C) (Oral)  SpO2 100% No data found.   Physical Exam  Constitutional: She appears well-developed. No distress.  Cardiovascular: Normal rate, regular rhythm, normal heart sounds and intact distal pulses.   No murmur heard. Pulmonary/Chest: Effort normal and breath sounds normal. No respiratory distress. She has no wheezes.  Abdominal: Soft. Bowel sounds are normal. She exhibits no distension and no mass. There is no tenderness.  Genitourinary: Uterus normal. No labial fusion. There is no rash, tenderness, lesion or injury on the right labia. There is no rash, tenderness, lesion or injury on the left labia. Uterus is not tender. Cervix exhibits no discharge. Right adnexum displays no mass, no tenderness and no fullness. Left adnexum displays no mass, no tenderness and no fullness. There is bleeding in the vagina. No vaginal discharge found.    Nursing note and vitals reviewed.   ED Course  Procedures (including critical care time)  Labs Review  Labs Reviewed  POCT URINALYSIS DIP (DEVICE) - Abnormal; Notable for the following:    Hgb urine dipstick LARGE (*)    Leukocytes, UA SMALL (*)    All other components within normal limits    Imaging Review No results found.   Visual Acuity Review  Right Eye Distance:   Left Eye Distance:   Bilateral Distance:    Right Eye Near:   Left Eye Near:    Bilateral Near:     Urinalysis    Component Value Date/Time   COLORURINE STRAW* 02/19/2016 0500   APPEARANCEUR  CLOUDY* 02/19/2016 0500   LABSPEC 1.015 02/22/2016 1650   PHURINE 5.5 02/22/2016 1650   GLUCOSEU NEGATIVE 02/22/2016 1650   HGBUR LARGE* 02/22/2016 1650   BILIRUBINUR NEGATIVE 02/22/2016 1650   KETONESUR NEGATIVE 02/22/2016 1650   PROTEINUR NEGATIVE 02/22/2016 1650   UROBILINOGEN 0.2 02/22/2016 1650   NITRITE NEGATIVE 02/22/2016 1650   LEUKOCYTESUR SMALL* 02/22/2016 1650     Results for orders placed or performed during the hospital encounter of 02/22/16 (from the past 24 hour(s))  POCT urinalysis dip (device)     Status: Abnormal   Collection Time: 02/22/16  4:50 PM  Result Value Ref Range   Glucose, UA NEGATIVE NEGATIVE mg/dL   Bilirubin Urine NEGATIVE NEGATIVE   Ketones, ur NEGATIVE NEGATIVE mg/dL   Specific Gravity, Urine 1.015 1.005 - 1.030   Hgb urine dipstick LARGE (A) NEGATIVE   pH 5.5 5.0 - 8.0   Protein, ur NEGATIVE NEGATIVE mg/dL   Urobilinogen, UA 0.2 0.0 - 1.0 mg/dL   Nitrite NEGATIVE NEGATIVE   Leukocytes, UA SMALL (A) NEGATIVE  I-STAT, chem 8     Status: None   Collection Time: 02/22/16  5:05 PM  Result Value Ref Range   Sodium 142 135 - 145 mmol/L   Potassium 4.0 3.5 - 5.1 mmol/L   Chloride 102 101 - 111 mmol/L   BUN 15 6 - 20 mg/dL   Creatinine, Ser 1.610.90 0.44 - 1.00 mg/dL   Glucose, Bld 93 65 - 99 mg/dL   Calcium, Ion 0.961.15 0.451.13 - 1.30 mmol/L   TCO2 30 0 - 100 mmol/L   Hemoglobin 13.9 12.0 - 15.0 g/dL   HCT 40.941.0 81.136.0 - 91.446.0 %       MDM  No diagnosis found. Vaginal bleeding   R/O endometrial cancer. I recommended endometrial biopsy and or pelvic U/S. I gave her [facility] phone number and address to call for endometrial biopsy and pelvic U/S. F/U as needed.    Doreene ElandKehinde T Eniola, MD 02/22/16 [phone]

## 2016-02-22 NOTE — Discharge Instructions (Signed)
It was nice seeing you today. I am sorry you have vaginal bleeding. i will recommended a pelvic ultrasound and endometrial biopsy. Please contact [facility] for endometrial biopsy and referral for U/S  Address: [address], [place], [place] [address] Hours: Open today  Open 24 hours Phone: [phone]  Abnormal Uterine Bleeding Abnormal uterine bleeding means bleeding from the vagina that is not your normal menstrual period. This can be:  Bleeding or spotting between periods.  Bleeding after sex (sexual intercourse).  Bleeding that is heavier or more than normal.  Periods that last longer than usual.  Bleeding after menopause. There are many problems that may cause this. Treatment will depend on the cause of the bleeding. Any kind of bleeding that is not normal should be reviewed by your doctor.  HOME CARE Watch your condition for any changes. These actions may lessen any discomfort you are having:  Do not use tampons or douches as told by your doctor.  Change your pads often. You should get regular pelvic exams and Pap tests. Keep all appointments for tests as told by your doctor. GET HELP IF:  You are bleeding for more than 1 week.  You feel dizzy at times. GET HELP RIGHT AWAY IF:   You pass out.  You have to change pads every 15 to 30 minutes.  You have belly pain.  You have a fever.  You become sweaty or weak.  You are passing large blood clots from the vagina.  You feel sick to your stomach (nauseous) and throw up (vomit). MAKE SURE YOU:  Understand these instructions.  Will watch your condition.  Will get help right away if you are not doing well or get worse.   This information is not intended to replace advice given to you by your health care provider. Make sure you discuss any questions you have with your health care provider.   Document Released: 09/20/2009 Document Revised: 11/28/2013 Document Reviewed: 06/22/2013 Elsevier  Interactive Patient Education [employer]2016 Elsevier Inc.

## 2016-02-22 NOTE — ED Notes (Signed)
Pt here with 3 days of light vaginal bleeding and today small clots noticed and abdominal pain  States the bleeding started after taking prescribed Prilosec given in ER  Menopause  Age 65 Last pap smear over 5 years ago

## 2016-02-25 ENCOUNTER — Telehealth: Payer: Self-pay | Admitting: Obstetrics & Gynecology

## 2016-02-25 DIAGNOSIS — N950 Postmenopausal bleeding: Secondary | ICD-10-CM

## 2016-02-25 NOTE — Telephone Encounter (Signed)
Called pt and explained need for pelvic US prior to clinic appt.  Pt voiced understanding and agreed to appt on 3/28 @ 1300. She will be contacted with clinic appt information.

## 2016-02-25 NOTE — Telephone Encounter (Signed)
Patient needs to be scheduled for a pelvic US, then be seen in clinic for endo bx.

## 2016-03-03 ENCOUNTER — Ambulatory Visit (HOSPITAL_COMMUNITY)
Admission: RE | Admit: 2016-03-03 | Discharge: 2016-03-03 | Disposition: A | Discharge status: Home / Self Care | Payer: Medicare Other | Source: Ambulatory Visit | Attending: Obstetrics & Gynecology | Admitting: Obstetrics & Gynecology

## 2016-03-03 ENCOUNTER — Ambulatory Visit
Admit: 2016-03-03 | Discharge: 2016-03-03 | Payer: PRIVATE HEALTH INSURANCE | Attending: Internal Medicine | Primary: Internal Medicine

## 2016-03-03 DIAGNOSIS — Z23 Encounter for immunization: Secondary | ICD-10-CM

## 2016-03-03 DIAGNOSIS — N950 Postmenopausal bleeding: Secondary | ICD-10-CM | POA: Diagnosis not present

## 2016-03-03 NOTE — Progress Notes (Signed)
PPD placed today

## 2016-03-05 ENCOUNTER — Encounter: Attending: Internal Medicine | Primary: Internal Medicine

## 2016-03-05 LAB — AMB POC TUBERCULOSIS, INTRADERMAL (SKIN TEST)
PPD: NEGATIVE Negative
mm Induration: 1 mm

## 2016-03-06 NOTE — Telephone Encounter (Signed)
In yellow folder.

## 2016-03-06 NOTE — Telephone Encounter (Signed)
Patient came into the office and stated that she was informed by Dr. Doreatha Martin that once she had her PPD placed and read that he would fill out a physical form for her. Form was received in the office and placed on nurse desk. Please call patient when form is complete.

## 2016-03-08 NOTE — Telephone Encounter (Signed)
Noted  

## 2016-03-10 MED ORDER — GABAPENTIN 300 MG CAP
300 mg | ORAL_CAPSULE | ORAL | 3 refills | Status: DC
Start: 2016-03-10 — End: 2016-08-09

## 2016-03-10 NOTE — Telephone Encounter (Signed)
I called and spoke with the patient and informed the patient that her refill request was approved and sent to the pharmacy. Patient verbalized understanding.

## 2016-03-10 NOTE — Telephone Encounter (Signed)
Gabapentin refilled and sent to pharmacy. Please notify pt.

## 2016-03-11 ENCOUNTER — Encounter

## 2016-03-11 NOTE — Telephone Encounter (Signed)
I called Patient in regards to paperwork for TB is ready for Pick up. Unable to leave message will try again later.

## 2016-03-17 NOTE — Telephone Encounter (Signed)
Patient called in and stated that she dropped off a physical form and wanted to know if it has been completed yet. Patient stated that she need to know what is going on with the paperwork. Patient would like a call back today and wants to know if the form has been completed or not. Please call [phone]. Please advise.

## 2016-03-17 NOTE — Telephone Encounter (Signed)
Called the pt but there was no answer. Left her a message requesting a call back.    Pt called me back. Informed her that the form is ready and available for pick up at the front desk.

## 2016-03-19 ENCOUNTER — Encounter: Attending: Internal Medicine | Primary: Internal Medicine

## 2016-03-25 ENCOUNTER — Encounter: Attending: Internal Medicine | Primary: Internal Medicine

## 2016-03-26 ENCOUNTER — Encounter: Payer: Self-pay | Admitting: Medical

## 2016-03-26 ENCOUNTER — Ambulatory Visit (INDEPENDENT_AMBULATORY_CARE_PROVIDER_SITE_OTHER): Payer: Medicare Other | Admitting: Medical

## 2016-03-26 ENCOUNTER — Other Ambulatory Visit (HOSPITAL_COMMUNITY)
Admission: RE | Admit: 2016-03-26 | Discharge: 2016-03-26 | Disposition: A | Discharge status: Home / Self Care | Payer: Medicare Other | Source: Ambulatory Visit | Attending: Family Medicine | Admitting: Family Medicine

## 2016-03-26 VITALS — BP 131/66 | HR 82 | Temp 98.5°F | Ht 65.0 in | Wt 250.5 lb

## 2016-03-26 DIAGNOSIS — N840 Polyp of corpus uteri: Secondary | ICD-10-CM | POA: Insufficient documentation

## 2016-03-26 DIAGNOSIS — N950 Postmenopausal bleeding: Secondary | ICD-10-CM | POA: Diagnosis not present

## 2016-03-26 NOTE — Patient Instructions (Signed)

## 2016-03-26 NOTE — Progress Notes (Signed)
Patient ID: Michelle Chavez, female   DOB: August 09, 1951, 65 y.o.   MRN: [id number]  Ms. Michelle Chavez is a 65 y.o. G0P0000 who presents to [facility] today for endometrial biopsy for post menopausal bleeding. The patient states that last month she had one episode of ~ 7 days of light to moderate vaginal bleeding. She states that she has been post menopausal for many years and denies previous episodes of bleeding. She denies bleeding today.   US Transvaginal Non-ob  03/03/2016  CLINICAL DATA:  Postmenopausal bleeding intermittently beginning March of this year EXAM: TRANSABDOMINAL AND TRANSVAGINAL ULTRASOUND OF PELVIS TECHNIQUE: Both transabdominal and transvaginal ultrasound examinations of the pelvis were performed. Transabdominal technique was performed for global imaging of the pelvis including uterus, ovaries, adnexal regions, and pelvic cul-de-sac. It was necessary to proceed with endovaginal exam following the transabdominal exam to visualize the uterus, endometrium, and ovaries. COMPARISON:  None in PACs FINDINGS: Uterus Measurements: 9.0 x 4.8 x 5.5 cm. The uterine echotexture is normal. No fibroids are observed. Endometrium Thickness: 13.8 mm. The endometrium appears thickened and is heterogeneous in echotexture there is some vascularity within the endometrium. Right ovary Measurements: 3.2 x 1.4 x 1.4 cm. Normal appearance/no adnexal mass. Left ovary Measurements: 3.1 x 1.9 x 1.3 cm. Normal appearance/no adnexal mass. Other findings There is no free pelvic fluid. IMPRESSION: 1. Thickened heterogeneous appearing and mildly hypervascular endometrium. In the setting of post-menopausal bleeding, endometrial sampling is indicated to exclude carcinoma. If results are benign, sonohysterogram should be considered for focal lesion work-up. (Ref: Radiological Reasoning: Algorithmic Workup of Abnormal Vaginal Bleeding with Endovaginal Sonography and Sonohysterography. AJR 2008; 409:W11-91). No evidence of uterine  fibroids. 2. Normal appearance of the ovaries.  No free pelvic fluid. Electronically Signed   By: David  [place] M.D.   On: 03/03/2016 14:00   US Pelvis Complete  03/03/2016  CLINICAL DATA:  Postmenopausal bleeding intermittently beginning March of this year EXAM: TRANSABDOMINAL AND TRANSVAGINAL ULTRASOUND OF PELVIS TECHNIQUE: Both transabdominal and transvaginal ultrasound examinations of the pelvis were performed. Transabdominal technique was performed for global imaging of the pelvis including uterus, ovaries, adnexal regions, and pelvic cul-de-sac. It was necessary to proceed with endovaginal exam following the transabdominal exam to visualize the uterus, endometrium, and ovaries. COMPARISON:  None in PACs FINDINGS: Uterus Measurements: 9.0 x 4.8 x 5.5 cm. The uterine echotexture is normal. No fibroids are observed. Endometrium Thickness: 13.8 mm. The endometrium appears thickened and is heterogeneous in echotexture there is some vascularity within the endometrium. Right ovary Measurements: 3.2 x 1.4 x 1.4 cm. Normal appearance/no adnexal mass. Left ovary Measurements: 3.1 x 1.9 x 1.3 cm. Normal appearance/no adnexal mass. Other findings There is no free pelvic fluid. IMPRESSION: 1. Thickened heterogeneous appearing and mildly hypervascular endometrium. In the setting of post-menopausal bleeding, endometrial sampling is indicated to exclude carcinoma. If results are benign, sonohysterogram should be considered for focal lesion work-up. (Ref: Radiological Reasoning: Algorithmic Workup of Abnormal Vaginal Bleeding with Endovaginal Sonography and Sonohysterography. AJR 2008; 478:G95-62). No evidence of uterine fibroids. 2. Normal appearance of the ovaries.  No free pelvic fluid. Electronically Signed   By: David  [place] M.D.   On: 03/03/2016 14:00   GYN Procedure:  Endometrial Biopsy  Patient given informed consent, signed copy in the chart, time out was performed. Appropriate time out taken. . The patient  was placed in the lithotomy position and the cervix brought into view with sterile speculum.  Portio of cervix cleansed x 2 with betadine swabs.  A  tenaculum was placed in the anterior lip of the cervix.  The uterus was sounded for depth of 5 cm. A pipelle was introduced to into the uterus, suction created,  and an endometrial sample was obtained. All equipment was removed and accounted for.  The patient tolerated the procedure well.   Patient given post procedure instructions. The patient will be contacted with results.  If endometrial sampling is normal and bleeding resumes, consider sonohystogram  Marny LowensteinJulie N Uthman Mroczkowski, PA-C 03/26/2016 3:24 PM

## 2016-04-02 NOTE — Telephone Encounter (Signed)
Requested Prescriptions     Pending Prescriptions Disp Refills   ??? omeprazole (PRILOSEC) 40 mg capsule 90 Cap 1     Sig: take 1 capsule by mouth once daily BEFORE BREAKFAST       Last office visit was  03/10/16  Next office visit is     06/09/16    Please assist.

## 2016-04-06 ENCOUNTER — Telehealth: Payer: Self-pay

## 2016-04-06 MED ORDER — OMEPRAZOLE 40 MG CAP, DELAYED RELEASE
40 mg | ORAL_CAPSULE | ORAL | 1 refills | Status: DC
Start: 2016-04-06 — End: 2016-07-03

## 2016-04-06 NOTE — Telephone Encounter (Signed)
Omeprazole refilled and sent to pharmacy. Please notify pt.

## 2016-04-06 NOTE — Telephone Encounter (Signed)
I called Pt in regards to Rx refill.  Informed Pt that Rx for prilosec was refilled and sent to pharmacy.

## 2016-04-06 NOTE — Telephone Encounter (Signed)
Pt have been informed of test results and will follow up as needed.

## 2016-05-18 ENCOUNTER — Encounter: Primary: Internal Medicine

## 2016-05-19 ENCOUNTER — Encounter

## 2016-05-19 ENCOUNTER — Inpatient Hospital Stay: Admit: 2016-05-19 | Primary: Internal Medicine

## 2016-05-20 ENCOUNTER — Encounter: Attending: Internal Medicine | Primary: Internal Medicine

## 2016-05-20 LAB — HEMOGLOBIN A1C W/O EAG: Hemoglobin A1c: 5.7 % — ABNORMAL HIGH (ref 4.8–5.6)

## 2016-05-20 LAB — TSH AND FREE T4
T4, Free: 0.95 ng/dL (ref 0.82–1.77)
TSH: 15.29 u[IU]/mL — ABNORMAL HIGH (ref 0.450–4.500)

## 2016-05-21 ENCOUNTER — Ambulatory Visit
Admit: 2016-05-21 | Discharge: 2016-05-21 | Payer: PRIVATE HEALTH INSURANCE | Attending: Internal Medicine | Primary: Internal Medicine

## 2016-05-21 DIAGNOSIS — R7303 Prediabetes: Secondary | ICD-10-CM

## 2016-05-21 MED ORDER — LEVOTHYROXINE 125 MCG TAB
125 mcg | ORAL_TABLET | Freq: Every day | ORAL | 3 refills | Status: DC
Start: 2016-05-21 — End: 2016-10-29

## 2016-05-21 NOTE — Patient Instructions (Signed)
DASH Diet: Care Instructions  Your Care Instructions  The DASH diet is an eating plan that can help lower your blood pressure. DASH stands for Dietary Approaches to Stop Hypertension. Hypertension is high blood pressure.  The DASH diet focuses on eating foods that are high in calcium, potassium, and magnesium. These nutrients can lower blood pressure. The foods that are highest in these nutrients are fruits, vegetables, low-fat dairy products, nuts, seeds, and legumes. But taking calcium, potassium, and magnesium supplements instead of eating foods that are high in those nutrients does not have the same effect. The DASH diet also includes whole grains, fish, and poultry.  The DASH diet is one of several lifestyle changes your doctor may recommend to lower your high blood pressure. Your doctor may also want you to decrease the amount of sodium in your diet. Lowering sodium while following the DASH diet can lower blood pressure even further than just the DASH diet alone.  Follow-up care is a key part of your treatment and safety. Be sure to make and go to all appointments, and call your doctor if you are having problems. It's also a good idea to know your test results and keep a list of the medicines you take.  How can you care for yourself at home?  Following the DASH diet  ?? Eat 4 to 5 servings of fruit each day. A serving is 1 medium-sized piece of fruit, ?? cup chopped or canned fruit, 1/4 cup dried fruit, or 4 ounces (?? cup) of fruit juice. Choose fruit more often than fruit juice.  ?? Eat 4 to 5 servings of vegetables each day. A serving is 1 cup of lettuce or raw leafy vegetables, ?? cup of chopped or cooked vegetables, or 4 ounces (?? cup) of vegetable juice. Choose vegetables more often than vegetable juice.  ?? Get 2 to 3 servings of low-fat and fat-free dairy each day. A serving is 8 ounces of milk, 1 cup of yogurt, or 1 ?? ounces of cheese.   ?? Eat 6 to 8 servings of grains each day. A serving is 1 slice of bread, 1 ounce of dry cereal, or ?? cup of cooked rice, pasta, or cooked cereal. Try to choose whole-grain products as much as possible.  ?? Limit lean meat, poultry, and fish to 2 servings each day. A serving is 3 ounces, about the size of a deck of cards.  ?? Eat 4 to 5 servings of nuts, seeds, and legumes (cooked dried beans, lentils, and split peas) each week. A serving is 1/3 cup of nuts, 2 tablespoons of seeds, or ?? cup of cooked beans or peas.  ?? Limit fats and oils to 2 to 3 servings each day. A serving is 1 teaspoon of vegetable oil or 2 tablespoons of salad dressing.  ?? Limit sweets and added sugars to 5 servings or less a week. A serving is 1 tablespoon jelly or jam, ?? cup sorbet, or 1 cup of lemonade.  ?? Eat less than 2,300 milligrams (mg) of sodium a day. If you limit your sodium to 1,500 mg a day, you can lower your blood pressure even more.  Tips for success  ?? Start small. Do not try to make dramatic changes to your diet all at once. You might feel that you are missing out on your favorite foods and then be more likely to not follow the plan. Make small changes, and stick with them. Once those changes become habit, add a few more   changes.  ?? Try some of the following:  ?? Make it a goal to eat a fruit or vegetable at every meal and at snacks. This will make it easy to get the recommended amount of fruits and vegetables each day.  ?? Try yogurt topped with fruit and nuts for a snack or healthy dessert.  ?? Add lettuce, tomato, cucumber, and onion to sandwiches.  ?? Combine a ready-made pizza crust with low-fat mozzarella cheese and lots of vegetable toppings. Try using tomatoes, squash, spinach, broccoli, carrots, cauliflower, and onions.  ?? Have a variety of cut-up vegetables with a low-fat dip as an appetizer instead of chips and dip.  ?? Sprinkle sunflower seeds or chopped almonds over salads. Or try adding  chopped walnuts or almonds to cooked vegetables.  ?? Try some vegetarian meals using beans and peas. Add garbanzo or kidney beans to salads. Make burritos and tacos with mashed pinto beans or black beans.  Where can you learn more?  Go to http://www.healthwise.net/GoodHelpConnections.  Enter H967 in the search box to learn more about "DASH Diet: Care Instructions."  Current as of: February 27, 2015  Content Version: 11.2  ?? 2006-2017 [employer]. Care instructions adapted under license by [employer] (which disclaims liability or warranty for this information). If you have questions about a medical condition or this instruction, always ask your healthcare professional. [employer], Inc[employer] disclaims any warranty or liability for your use of this information.       Prediabetes: Care Instructions  Your Care Instructions  Prediabetes is a warning sign that you are at risk for getting type 2 diabetes. It means that your blood sugar is higher than it should be. The food you eat turns into sugar, which your body uses for energy. Normally, an organ called the pancreas makes insulin, which allows the sugar in your blood to get into your body's cells. But when your body can't use insulin the right way, the sugar doesn't move into cells. It stays in your blood instead. This is called insulin resistance. The buildup of sugar in the blood causes prediabetes.  The good news is that lifestyle changes may help you get your blood sugar back to normal and help you avoid or delay diabetes.  Follow-up care is a key part of your treatment and safety. Be sure to make and go to all appointments, and call your doctor if you are having problems. It's also a good idea to know your test results and keep a list of the medicines you take.  How can you care for yourself at home?  ?? Watch your weight. A healthy weight helps your body use insulin properly.   ?? Limit the amount of calories, sweets, and unhealthy fat you eat. Ask your doctor if you should see a dietitian. A registered dietitian can help you create meal plans that fit your lifestyle.  ?? Get at least 30 minutes of exercise on most days of the week. Exercise helps control your blood sugar. It also helps you maintain a healthy weight. Walking is a good choice. You also may want to do other activities, such as running, swimming, cycling, or playing tennis or team sports.  ?? Do not smoke. Smoking can make prediabetes worse. If you need help quitting, talk to your doctor about stop-smoking programs and medicines. These can increase your chances of quitting for good.  ?? If your doctor prescribed medicines, take them exactly as prescribed. Call your doctor if you think you are having   a problem with your medicine. You will get more details on the specific medicines your doctor prescribes.  When should you call for help?  Watch closely for changes in your health, and be sure to contact your doctor if:  ?? You have any symptoms of diabetes. These may include:  ?? Being thirsty more often.  ?? Urinating more.  ?? Being hungrier.  ?? Losing weight.  ?? Being very tired.  ?? Having blurry vision.  ?? You have a wound that will not heal.  ?? You have an infection that will not go away.  ?? You have problems with your blood pressure.  ?? You want more information about diabetes and how you can keep from getting it.  Where can you learn more?  Go to [email or web address].  Enter I222 in the search box to learn more about "Prediabetes: Care Instructions."  Current as of: Apr 29, 2015  Content Version: 11.2  ?? 2006-2017 [employer]. Care instructions adapted under license by [employer] (which disclaims liability or warranty for this information). If you have questions about a medical condition or this instruction, always ask your healthcare professional. [place] any warranty or liability for your use of this information.       Starting a Weight Loss Plan: Care Instructions  Your Care Instructions  If you are thinking about losing weight, it can be hard to know where to start. Your doctor can help you set up a weight loss plan that best meets your needs. You may want to take a class on nutrition or exercise, or join a weight loss support group. If you have questions about how to make changes to your eating or exercise habits, ask your doctor about seeing a registered dietitian or an exercise specialist.  It can be a big challenge to lose weight. But you do not have to make huge changes at once. Make small changes, and stick with them. When those changes become habit, add a few more changes.  If you do not think you are ready to make changes right now, try to pick a date in the future. Make an appointment to see your doctor to discuss whether the time is right for you to start a plan.  Follow-up care is a key part of your treatment and safety. Be sure to make and go to all appointments, and call your doctor if you are having problems. It???s also a good idea to know your test results and keep a list of the medicines you take.  How can you care for yourself at home?  ?? Set realistic goals. Many people expect to lose much more weight than is likely. A weight loss of 5% to 10% of your body weight may be enough to improve your health.  ?? Get family and friends involved to provide support. Talk to them about why you are trying to lose weight, and ask them to help. They can help by participating in exercise and having meals with you, even if they may be eating something different.  ?? Find what works best for you. If you do not have time or do not like to cook, a program that offers meal replacement bars or shakes may be better for you. Or if you like to prepare meals, finding a plan that includes daily menus and recipes may be best.   ?? Ask your doctor about other health professionals who can help you achieve your weight loss goals.  ?? A  dietitian can help you make healthy changes in your diet.  ?? An exercise specialist or personal trainer can help you develop a safe and effective exercise program.  ?? A counselor or psychiatrist can help you cope with issues such as depression, anxiety, or family problems that can make it hard to focus on weight loss.  ?? Consider joining a support group for people who are trying to lose weight. Your doctor can suggest groups in your area.  Where can you learn more?  Go to [email or web address].  Enter [phone] in the search box to learn more about "Starting a Weight Loss Plan: Care Instructions."  Current as of: September 19, 2015  Content Version: 11.2  ?? 2006-2017 [employer]. Care instructions adapted under license by [employer] (which disclaims liability or warranty for this information). If you have questions about a medical condition or this instruction, always ask your healthcare professional. [place] any warranty or liability for your use of this information.

## 2016-05-21 NOTE — Progress Notes (Signed)
Chief Complaint   Patient presents with   ??? Hypertension   ??? Diabetes   ??? Cholesterol Problem   ??? Results     lab F/U         HPI:  Patient is a 65 year old obese African American female with medical problems listed below presents today for follow up of Hypertension, Prediabetes, Hypothyroidism, Hyperlipidemia, GERD, Vit D def, etc. She has been feeling well and denies CP, SOB, palpitations, nausea, vomiting, or other complaints today. She is complaint with her medications with no adverse effects reported and has lost one pound in the last three months.         Past Medical History:   Diagnosis Date   ??? Anemia    ??? Asthma    ??? Colon polyp    ??? DDD (degenerative disc disease) 02/15/2009   ??? Depression 12/18/2011   ??? Essential hypertension, benign    ??? History of echocardiogram 03/20/2004    EF 70%.  Borderline DDfx.  No significant valvular pathology.   ??? Hypercholesterolemia    ??? Hypertension    ??? Iron deficiency anemia 99991111   ??? 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 Lower extremity venous duplex 10/23/2009    Left leg:  No DVT.     ??? Meralgia paraesthetica 10/22/2013   ??? OA (osteoarthritis of spine) 02/15/2009   ??? OA (osteoarthritis of spine) 02/15/2009   ??? Obesity, unspecified    ??? Other and unspecified hyperlipidemia    ??? Pre-operative cardiovascular examination     For spine surgery   ??? Right sided sciatica    ??? Shortness of breath     Possible asthma, HCVD; less likely CAD (Noted 03/15/09)   ??? Sleep apnea 02/15/2009    uses cpap machine   ??? Thallium stress test  03/20/2004    Partially transient, mod basal & mid anterior defect most c/w artifact; mild anterior ischemia less likely.  Neg EKG on pharm stress test.   ??? Thyroid disease     hypothyroidism     Allergies   Allergen Reactions   ??? Asa-Acetaminophen-Caff-Potass Other (comments)     Bleeding in stomach   ??? Benadryl [Diphenhydramine Hcl] Other (comments)     Muscle jerking   ??? Other Plant, Animal, Environmental Not Reported This Time     Grass, Dust and Mold    ??? Pollen Extracts Not Reported This Time     Current Outpatient Prescriptions   Medication Sig Dispense Refill   ??? omeprazole (PRILOSEC) 40 mg capsule take 1 capsule by mouth once daily BEFORE BREAKFAST 90 Cap 1   ??? gabapentin (NEURONTIN) 300 mg capsule take 1 capsule by mouth every morning and 2 at bedtime 90 Cap 3   ??? levothyroxine (SYNTHROID) 112 mcg tablet Take 1 Tab by mouth Daily (before breakfast). 30 Tab 3   ??? predniSONE (STERAPRED DS) 10 mg dose pack See administration instruction per 10mg  dose pack 21 Tab 0   ??? valsartan-hydroCHLOROthiazide (DIOVAN-HCT) 160-25 mg per tablet take 1 tablet by mouth daily 30 Tab 3   ??? rosuvastatin (CRESTOR) 40 mg tablet take 1 tablet by mouth once daily 30 Tab 3   ??? montelukast (SINGULAIR) 10 mg tablet Take 1 Tab by mouth nightly as needed. Indications: asthma 90 Tab 1   ??? triamcinolone acetonide (KENALOG) 0.1 % topical cream Apply  to affected area two (2) times a day. use thin layer 60 g 0   ??? omeprazole (PRILOSEC) 40 mg capsule TAKE  1 CAPSULE BY MOUTH ONCE A DAY BEFORE BREAKFAST 30 Cap 5   ??? VARICELLA-ZOSTER VACINE LIVE 19,400 unit/0.65 mL susr injection   0   ??? gabapentin (NEURONTIN) 300 mg capsule take 1 capsule by mouth every morning and 2 capsules by mouth at bedtime 180 Cap 1   ??? ranitidine (ZANTAC) 150 mg tablet Take 1 Tab by mouth two (2) times a day. 180 Tab 1   ??? albuterol (PROVENTIL HFA) 90 mcg/actuation inhaler Take 2 Puffs by inhalation every six (6) hours as needed for Wheezing. 1 Inhaler 6   ??? traMADol (ULTRAM) 50 mg tablet Take 1 Tab by mouth every six (6) hours as needed for Pain. Max Daily Amount: 200 mg. 40 Tab 0   ??? budesonide-formoterol (SYMBICORT) 160-4.5 mcg/actuation HFA inhaler Take 2 Puffs by inhalation two (2) times a day. 1 Inhaler 5   ??? IRON DEXTRAN COMPLEX (IRON DEXTRAN IV) by IntraVENous route.     ??? Cholecalciferol, Vitamin D3, (VITAMIN D-3) 5,000 unit Tab Take  by mouth daily.      ??? chlorpheniramine-HYDROcodone (TUSSIONEX) 10-8 mg/5 mL suspension Take 5 mL by mouth every twelve (12) hours as needed for Cough. Max Daily Amount: 10 mL. 240 mL 0   ??? cyclobenzaprine (FLEXERIL) 10 mg tablet Take 0.5 Tabs by mouth two (2) times a day. 60 Tab 0   ??? predniSONE (DELTASONE) 20 mg tablet Take 1 Tab by mouth daily (with breakfast). 10 Tab 0   ??? ALPRAZolam (XANAX) 0.5 mg tablet 1 tab PO BID starting 2 days prior to MRI scan  Indications: ANXIETY 6 Tab 0   ??? topiramate (TOPAMAX) 25 mg tablet Take 1 Tab by mouth two (2) times a day. 60 Tab 2          ROS:  Constitutional: Negative for fever, chills, or fatigue  Neurological: Negative for headache, dizziness, visual disturbance, or loss of consciousness  Respiratory: Negative for SOB, hemoptysis, or wheezing  Cardiovascular: Negative for chest pain, palpitation, or leg swelling  Gastrointestinal: Negative for abdominal pain, nausea, vomiting, diarrhea, blood in stool, melena, or heartburn  Musculoskeletal: Negative for falls          Physical Exam:  Visit Vitals   ??? BP 128/74 (BP 1 Location: Left arm, BP Patient Position: Sitting)   ??? Pulse 100   ??? Resp 18   ??? Ht 5\' 4"  (1.626 m)   ??? Wt 200 lb 3.2 oz (90.8 kg)   ??? BMI 34.36 kg/m2     General: Obese black female, a & o x 3, afebrile, interacting appropriately, in no acute distress  Skin: warm and dry, no rashes , no bruises  Neck: supple, symmetrical, no thyromegaly  Respiratory: symmetrical chest expansion, lung sounds clear bilaterally, good air entry, good respiratory effort, no wheezes or crackles  Cardiovascular: normal S1S2, regular rate and rhythm, no murmurs, pulses palpable, no thrill, no carotid or abdominal bruits, no peripheral edema, no JVD  Abdomen: non-distended, normoactive bowel sounds x 4 quadrants, soft, non-tender to palpation  Musculoskeletal: normal ROM on all joints, no swelling or deformity, no perilumbar tenderness, steady gait      Assessment/Plan:    ICD-10-CM ICD-9-CM     1. Pre-diabetes R73.03 790.29 Recent HBA1C improved at 5.7.  Counseled on diet and exercise.  HEMOGLOBIN A1C W/O EAG   2. 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  levothyroxine (SYNTHROID) increased from 112  to 125 mcg tablet today and will recheck TFT at f/u in 3 months.   3. Hypercholesteremia E78.00 272.0 Continue Crestor 40 mg daily and she was counseled on low fat diet - will check lipid panel at f/u in 3 months.  LIPID PANEL   4. HTN (hypertension), benign I10 401.1 Controlled.  Continue current meds and she was counseled on low salt diet.  CBC WITH AUTOMATED DIFF      METABOLIC PANEL, COMPREHENSIVE      T4, FREE      TSH 3RD GENERATION   5. Gastroesophageal reflux disease without esophagitis K21.9 530.81 Stable   6. Vitamin D deficiency E55.9 268.9 VITAMIN D, 25 HYDROXY   7. Obesity (BMI 30-39.9) E66.9 278.00 I have reviewed/discussed the above normal BMI with the patient.  I have recommended the following interventions: dietary management education, guidance, and counseling, encourage exercise and monitor weight . [name]           Orders Placed This Encounter   ??? CBC WITH AUTOMATED DIFF     Standing Status:   Future     Standing Expiration Date:   11/17/2016   ??? HEMOGLOBIN A1C W/O EAG     Standing Status:   Future     Standing Expiration Date:   05/22/2017   ??? LIPID PANEL     Standing Status:   Future     Standing Expiration Date:   [id number]   ??? METABOLIC PANEL, COMPREHENSIVE     Standing Status:   Future     Standing Expiration Date:   11/17/2016   ??? T4, FREE     Standing Status:   Future     Standing Expiration Date:   11/17/2016   ??? TSH 3RD GENERATION     Standing Status:   Future     Standing Expiration Date:   09/18/2016   ??? VITAMIN D, 25 HYDROXY     Standing Status:   Future     Standing Expiration Date:   09/20/2016   ??? levothyroxine (SYNTHROID) 125 mcg tablet      Sig: Take 1 Tab by mouth Daily (before breakfast).     Dispense:  30 Tab     Refill:  3       Recent labs reviewed with pt      Additional Notes: Discussed today's diagnosis, treatment plans. Discussed medication indications and side effects.    Weight Management:Counseled  After Visit Summary: Discussed provided printed patient instructions. Answered questions accordingly.  Follow-up Disposition: In 3 months with labs 1 week prior          Osie Cheeks, DO, MPH  Internal Medicine

## 2016-05-21 NOTE — Progress Notes (Signed)
Chief Complaint   Patient presents with   ??? Hypertension   ??? Diabetes   ??? Cholesterol Problem   ??? Results     lab F/U   Patient is here today for 3 mth F/U.  1. Have you beend to the ER, urgent care clinic since your last visit?  Hospitalized since your last visit?No    2. Have you seen or consulted any other health care providers outside of the [facility] since your last visit?  Include any pap smears or colon screening. No

## 2016-06-01 ENCOUNTER — Encounter

## 2016-06-01 MED ORDER — PREDNISONE 20 MG TAB
20 mg | ORAL_TABLET | Freq: Every day | ORAL | 0 refills | Status: DC
Start: 2016-06-01 — End: 2016-07-03

## 2016-06-01 NOTE — Telephone Encounter (Signed)
Dr Doreatha Martin please advise below.  Thank you

## 2016-06-01 NOTE — Telephone Encounter (Signed)
Prednisone sent to pharmacy to help with pain and advise her to continue taking Gabapentin.

## 2016-06-01 NOTE — Telephone Encounter (Signed)
Pt states that she is arthritis in her lower back and would like to know if something could be called into the pharmacy to help her cope with the pain.    Pt states that she will be going out of town soon and want something that would not knock her out but just help the pain.    Pt going on vacation and want to be able to walk and when she's in pain she can't really walk.

## 2016-06-01 NOTE — Telephone Encounter (Signed)
I called Pt in regards to Prednisone mediation has been sent to the pharmacy. Informed Pt to continue to take the gabepentin as well.  Pt verbalized understanding.

## 2016-06-08 ENCOUNTER — Encounter

## 2016-06-08 MED ORDER — ROSUVASTATIN 40 MG TAB
40 mg | ORAL_TABLET | ORAL | 3 refills | Status: DC
Start: 2016-06-08 — End: 2016-06-25

## 2016-06-08 MED ORDER — VALSARTAN-HYDROCHLOROTHIAZIDE 160 MG-25 MG TAB
160-25 mg | ORAL_TABLET | ORAL | 3 refills | Status: DC
Start: 2016-06-08 — End: 2017-02-01

## 2016-06-08 NOTE — Telephone Encounter (Signed)
Requested Prescriptions     Pending Prescriptions Disp Refills   ??? rosuvastatin (CRESTOR) 40 mg tablet 30 Tab 3     Sig: take 1 tablet by mouth once daily   ??? valsartan-hydroCHLOROthiazide (DIOVAN-HCT) 160-25 mg per tablet 30 Tab 3     Sig: take 1 tablet by mouth daily       Last office visit was  05/22/16  Next office visit is     08/22/16    Please assist.

## 2016-06-08 NOTE — Telephone Encounter (Signed)
I call Pt in regards to Rx refill on Crestor and Diovan-HCT. LM on VM   Informed Pt that Rx was refilled and sent to the pharmacy.

## 2016-06-08 NOTE — Telephone Encounter (Signed)
Crestor and Diovan-HCT refilled and sent to pharmacy. Please notify pt.

## 2016-06-23 DIAGNOSIS — R7301 Impaired fasting glucose: Secondary | ICD-10-CM | POA: Diagnosis not present

## 2016-06-23 DIAGNOSIS — E785 Hyperlipidemia, unspecified: Secondary | ICD-10-CM | POA: Diagnosis not present

## 2016-06-23 DIAGNOSIS — K219 Gastro-esophageal reflux disease without esophagitis: Secondary | ICD-10-CM | POA: Diagnosis not present

## 2016-06-23 DIAGNOSIS — I10 Essential (primary) hypertension: Secondary | ICD-10-CM | POA: Diagnosis not present

## 2016-06-23 DIAGNOSIS — M109 Gout, unspecified: Secondary | ICD-10-CM | POA: Diagnosis not present

## 2016-06-23 NOTE — Telephone Encounter (Signed)
Pt would like to know if there is something that could be sent to the pharmacy instead of Crestor.    The Crestor is costing the patient $21 with insurance, please send something cheaper to the pharmacy.

## 2016-06-25 MED ORDER — ATORVASTATIN 40 MG TAB
40 mg | ORAL_TABLET | Freq: Every day | ORAL | 0 refills | Status: DC
Start: 2016-06-25 — End: 2016-07-03

## 2016-06-25 NOTE — Telephone Encounter (Signed)
Please notify patient medication has been sent to pharmacy.  She needs to discontinue Crestor when she is out.

## 2016-06-25 NOTE — Telephone Encounter (Signed)
[name] could you please send a different Rx to the Pharmacy for this patient. Thank you

## 2016-06-25 NOTE — Telephone Encounter (Signed)
Patient is aware to discontinue Crestor when she runs out and Atorvastatin has been sent to the pharmacy.  Patient verbalizes understanding.

## 2016-06-29 DIAGNOSIS — E119 Type 2 diabetes mellitus without complications: Secondary | ICD-10-CM | POA: Diagnosis not present

## 2016-07-02 ENCOUNTER — Emergency Department: Admit: 2016-07-03 | Payer: BLUE CROSS/BLUE SHIELD | Primary: Internal Medicine

## 2016-07-02 DIAGNOSIS — E876 Hypokalemia: Secondary | ICD-10-CM

## 2016-07-02 NOTE — ED Notes (Addendum)
ECG complete at 2102, pt currently in radiology, upon return IV medications to be infused.

## 2016-07-02 NOTE — ED Triage Notes (Signed)
Pt c/o chest pain with dizziness and nausea since 1630 today.  Pt sent from [facility].

## 2016-07-02 NOTE — ED Provider Notes (Signed)
[place]  Emergency Department Treatment Report    Patient: Elaine [name] Age: 65 y.o. Sex: female    Date of Birth: January 31, 1951 Admit Date: 07/02/2016 PCP: Osie Cheeks, DO   MRN: [id number]  CSN: [id number]     Room: ER32/ER32 Time Dictated: 10:29 PM      Chief Complaint   Chief Complaint   Patient presents with   ??? Chest Pain   ??? Dizziness       History of Present Illness   65 y.o. female with PMH of asthma, HTN, and high cholesterol presents to the ED today with light-headedness and chest pain.  Symptoms started at approximately 4 p.m..  The chest pain is coming and going, and it's in her mid-chest.  It's described as a dull ache (5/10).  Usually lasts for a minute at a time.  No known history of CAD.  No SOB of pleurisy.  She was outside today at a concert when symptoms started.     Review of Systems   Constitutional: No fever, but she got very hot earlier  Eyes: No visual symptoms.  ENT: No sore throat, runny nose   Respiratory: +chronic cough  Cardiovascular: as above  Gastrointestinal: No vomiting, diarrhea or abdominal pain.  Genitourinary: No dysuria  Musculoskeletal: No swelling in the lower extremities  Integumentary: No rashes.  Neurological: has chronic headaches from coughing  Denies complaints in all other systems.  Past Medical/Surgical History     Past Medical History:   Diagnosis Date   ??? Anemia    ??? Asthma    ??? Colon polyp    ??? DDD (degenerative disc disease) 02/15/2009   ??? Depression 12/18/2011   ??? Essential hypertension, benign    ??? History of echocardiogram 03/20/2004    EF 70%.  Borderline DDfx.  No significant valvular pathology.   ??? Hypercholesterolemia    ??? Hypertension    ??? Iron deficiency anemia 99991111   ??? 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 Lower extremity venous duplex 10/23/2009    Left leg:  No DVT.     ??? Meralgia paraesthetica 10/22/2013   ??? OA (osteoarthritis of spine) 02/15/2009   ??? OA (osteoarthritis of spine) 02/15/2009   ??? Obesity, unspecified     ??? Other and unspecified hyperlipidemia    ??? Pre-operative cardiovascular examination     For spine surgery   ??? Right sided sciatica    ??? Shortness of breath     Possible asthma, HCVD; less likely CAD (Noted 03/15/09)   ??? Sleep apnea 02/15/2009    uses cpap machine   ??? Thallium stress test  03/20/2004    Partially transient, mod basal & mid anterior defect most c/w artifact; mild anterior ischemia less likely.  Neg EKG on pharm stress test.   ??? Thyroid disease     hypothyroidism     Past Surgical History:   Procedure Laterality Date   ??? HX COLONOSCOPY  02-24-12    normal (Dr. Angelique Holm)   ??? HX HERNIA REPAIR      3x   ??? HX HYSTERECTOMY     ??? HX TUBAL LIGATION     ??? NEUROLOGICAL PROCEDURE UNLISTED  03-16-2009    s/p ACD & Fusion (Dr.P.Gurtner)   ??? PR COLONOSCOPY FLX DX W/COLLJ SPEC WHEN PFRMD  02-2007    +polyp(tubular adenoma); Dr Angelique Holm       Social History     Social History     Social History   ???  Marital status: DIVORCED     Spouse name: N/A   ??? Number of children: N/A   ??? Years of education: N/A     Social History Main Topics   ??? Smoking status: Former Smoker     Years: 28.00     Quit date: 04/25/2004   ??? Smokeless tobacco: Never Used   ??? Alcohol use No   ??? Drug use: No   ??? Sexual activity: Not Asked     Other Topics Concern   ??? None     Social History Narrative       Family History     Family History   Problem Relation Age of Onset   ??? Cancer Mother    ??? Hypertension Mother    ??? Hypertension Sister      x3   ??? Heart Surgery Sister    ??? Heart Disease Sister    ??? Heart Attack Sister    ??? Cancer Maternal Aunt      breast   ??? Breast Cancer Maternal Aunt    ??? Glaucoma Son    ??? Heart defect Son    ??? Diabetes Paternal Uncle    ??? Hypertension Paternal Uncle        Home Medications     (Not in a hospital admission)    Allergies     Allergies   Allergen Reactions   ??? Asa-Acetaminophen-Caff-Potass Other (comments)     Bleeding in stomach   ??? Benadryl [Diphenhydramine Hcl] Other (comments)     Muscle jerking    ??? Other Plant, Animal, Environmental Not Reported This Time     Grass, Dust and Mold   ??? Pollen Extracts Not Reported This Time       Physical Exam   ED Triage Vitals   Enc Vitals Group      BP 07/02/16 2107 113/51      Pulse (Heart Rate) 07/02/16 2107 81      Resp Rate 07/02/16 2107 20      Temp 07/02/16 2107 98.1 ??F (36.7 ??C)      Temp src --       O2 Sat (%) 07/02/16 2107 95 %      Weight 07/02/16 2150 199 lb      Height --       Head Cir --       Peak Flow --       Pain Score --       Pain Loc --       Pain Edu? --       Excl. in Major? --      Constitutional: Patient appears well developed and well nourished. Appearance and behavior are age and situation appropriate.  HEENT: Conjunctiva clear. Mucous membranes moist.  Neck: supple, non tender.   Respiratory: lungs clear to auscultation, nonlabored respirations. No tachypnea or accessory muscle use.  Cardiovascular: heart regular rate and rhythm without murmur rubs or gallops.   Calves soft and non-tender. No peripheral edema or significant variscosities.    Gastrointestinal:  Abdomen soft, nontender without complaint of pain to palpation  Musculoskeletal: no deformities noted; moves all extremities without difficulty  Integumentary: warm and dry without rashes or lesions  Neurologic: alert and oriented x 3; No facial asymmetry or dysarthria.  Impression and Management Plan   65 year old female presents to the ED with chest pain.  Sent here from urgent care.    Diagnostic Studies   Lab:   Recent Results (  from the past 12 hour(s))   POC TROPONIN-I    Collection Time: 07/02/16  9:44 PM   Result Value Ref Range    Troponin-I 0.00 0.00 - 0.07 ng/ml     Labs from patient first: Sodium 139 potassium 3.0 chloride 96 CO2 29 glucose 99 BUN 10 creatinine 1.3  White blood cell count 6.5 hemoglobin 10.6 hematocrit 33.5 platelets 329    Imaging:    Xr Chest Pa Lat    Result Date: 07/02/2016  PA lateral chest. Comparison 04/22/2007. INDICATION: Cough. FINDINGS:  Previous cervical spine surgery. Scattered arthritis thoracic spine. Cardiac silhouette normal in size. Lungs clear of any acute process.     IMPRESSION: No active disease.       EKG: sinus rhtyhm; no significant ST-T changes suggestive of acute ischemia  ED Course   Patient remained in stable condition. Initial Emergency Department evaluation of this patient appears to be negative for evidence of Acute Coronary Ischemia requiring hospital admission or urgent intervention. However, ischemic coronary disease has not been eliminated as a consideration. Consequently, the patient will be assigned to the Emergency Department Observation Unit for serial cardiac enzymes and, if these are negative, resting and stress echocardiography or other additional diagnostic testing will be performed  Medical Decision Making     Final Diagnosis       ICD-10-CM ICD-9-CM   1. Chest pain, unspecified type R07.9 786.50       Disposition   Patient assigned to OBS in stable condition.     Payor: BLUE CROSS / Plan: [facility] [employer] / Product Type: *No Product type* /   Minna Merritts, MD  July 03, 2016    My signature above authenticates this document and my orders, the final ??  diagnosis (es), discharge prescription (s), and instructions in the Epic ??  record.  If you have any questions please contact [phone].  ??  Nursing notes have been reviewed by the physician/ advanced practice ??  Clinician.    Dragon medical dictation software was used for portions of this report. Unintended voice recognition errors may occur.

## 2016-07-02 NOTE — ED Notes (Addendum)
Pt states "only ate 2 mini donuts and two cups of coffee today".  Pt states "about 4pm began to feel dizzy, weak, low blood pressure".  Also admits to exertional dyspnea "for a few months".  Pcp unaware of dyspnea.  Pt presented to Urgent Care and was sent here for further evaluation.   Pt denies heart issues.  Pt denies pain, vomiting but does admit to nausea, and "blurry vision".  Finally, pt states "earlier felt like was spinning".  Pt placed on monitor, ECG complete, #20 placed in left a/c with labs drawn.

## 2016-07-03 ENCOUNTER — Inpatient Hospital Stay
Admit: 2016-07-03 | Discharge: 2016-07-03 | Disposition: A | Discharge status: Home / Self Care | Payer: BLUE CROSS/BLUE SHIELD | Attending: Emergency Medicine

## 2016-07-03 LAB — EKG 12-LEAD
Atrial Rate: 63 {beats}/min
Atrial Rate: 77 {beats}/min
Diagnosis: NORMAL
P Axis: 71 degrees
P Axis: 77 degrees
P-R Interval: 190 ms
P-R Interval: 212 ms
Q-T Interval: 408 ms
Q-T Interval: 450 ms
QRS Duration: 78 ms
QRS Duration: 80 ms
QTc Calculation (Bazett): 460 ms
QTc Calculation (Bazett): 461 ms
R Axis: 40 degrees
R Axis: 47 degrees
T Axis: 43 degrees
T Axis: 53 degrees
Ventricular Rate: 63 {beats}/min
Ventricular Rate: 77 {beats}/min

## 2016-07-03 LAB — ECHO STRESS
ECG Interp During Ex: NORMAL
ECG Interp. During Exercise: NORMAL
Functional Capacity: NORMAL
Functional capacity: NORMAL
Max Diastolic BP: 66 mmHg
Max Heart Rate: 131 {beats}/min
Max Systolic BP: 158 mmHg
Max. Diastolic BP: 66 mmHg
Max. Heart rate: 131 {beats}/min
Max. Systolic BP: 158 mmHg
Overall BP Response To Exercise: NORMAL
Overall BP response to exercise: NORMAL
Overall HR Response To Exercise: NORMAL
Overall HR response to exercise: NORMAL
Peak EX METS: 1.1 METS
Peak Ex METs: 1.1 METS

## 2016-07-03 LAB — EKG, 12 LEAD, INITIAL
Atrial Rate: 63 {beats}/min
Atrial Rate: 77 {beats}/min
Calculated P Axis: 71 degrees
Calculated P Axis: 77 degrees
Calculated R Axis: 40 degrees
Calculated R Axis: 47 degrees
Calculated T Axis: 43 degrees
Calculated T Axis: 53 degrees
Diagnosis: NORMAL
P-R Interval: 190 ms
P-R Interval: 212 ms
Q-T Interval: 408 ms
Q-T Interval: 450 ms
QRS Duration: 78 ms
QRS Duration: 80 ms
QTC Calculation (Bezet): 460 ms
QTC Calculation (Bezet): 461 ms
Ventricular Rate: 63 {beats}/min
Ventricular Rate: 77 {beats}/min

## 2016-07-03 LAB — POC CHEM8
BUN: 10 mg/dl (ref 7–25)
CALCIUM,IONIZED: 4.6 mg/dL (ref 4.40–5.40)
CO2, TOTAL: 30 mmol/L (ref 21–32)
Chloride: 96 mEq/L — ABNORMAL LOW (ref 98–107)
Creatinine: 1.1 mg/dl (ref 0.6–1.3)
Glucose: 135 mg/dL — ABNORMAL HIGH (ref 74–106)
HCT: 36 % — ABNORMAL LOW (ref 38–45)
HGB: 12.2 gm/dl — ABNORMAL LOW (ref 12.4–17.2)
Potassium: 2.9 mEq/L — CL (ref 3.5–4.9)
Sodium: 140 mEq/L (ref 136–145)

## 2016-07-03 LAB — POC TROPONIN
Troponin-I: 0 ng/ml (ref 0.00–0.07)
Troponin-I: 0 ng/ml (ref 0.00–0.07)

## 2016-07-03 LAB — TROPONIN I: Troponin-I: 0.032 ng/ml (ref 0.000–0.045)

## 2016-07-03 LAB — POTASSIUM, POC: Potassium: 3.5 mEq/L (ref 3.5–4.9)

## 2016-07-03 MED ORDER — LEVOTHYROXINE 125 MCG TAB
125 mcg | Freq: Every day | ORAL | Status: DC
Start: 2016-07-03 — End: 2016-07-03
  Administered 2016-07-03: 13:00:00 via ORAL

## 2016-07-03 MED ORDER — METHOCARBAMOL 500 MG TAB
500 mg | ORAL_TABLET | Freq: Three times a day (TID) | ORAL | 0 refills | Status: DC
Start: 2016-07-03 — End: 2016-10-20

## 2016-07-03 MED ORDER — POTASSIUM CHLORIDE SR 20 MEQ TAB, PARTICLES/CRYSTALS
20 mEq | ORAL | Status: AC
Start: 2016-07-03 — End: 2016-07-03
  Administered 2016-07-03: 07:00:00 via ORAL

## 2016-07-03 MED ORDER — ASPIRIN 81 MG CHEWABLE TAB
81 mg | ORAL | Status: AC
Start: 2016-07-03 — End: 2016-07-03
  Administered 2016-07-03: 07:00:00 via ORAL

## 2016-07-03 MED ORDER — SODIUM CHLORIDE 0.9% BOLUS IV
0.9 % | INTRAVENOUS | Status: AC
Start: 2016-07-03 — End: 2016-07-02
  Administered 2016-07-03: 03:00:00 via INTRAVENOUS

## 2016-07-03 MED ORDER — PREDNISONE 20 MG TAB
20 mg | Freq: Every day | ORAL | Status: DC
Start: 2016-07-03 — End: 2016-07-03

## 2016-07-03 MED ORDER — ATORVASTATIN 40 MG TAB
40 mg | Freq: Every day | ORAL | Status: DC
Start: 2016-07-03 — End: 2016-07-03

## 2016-07-03 MED FILL — PREDNISONE 20 MG TAB: 20 mg | ORAL | Qty: 1

## 2016-07-03 MED FILL — LIPITOR 40 MG TABLET: 40 mg | ORAL | Qty: 1

## 2016-07-03 MED FILL — ASPIRIN 81 MG CHEWABLE TAB: 81 mg | ORAL | Qty: 2

## 2016-07-03 MED FILL — KLOR-CON M20 MEQ TABLET,EXTENDED RELEASE: 20 mEq | ORAL | Qty: 2

## 2016-07-03 MED FILL — LEVOTHYROXINE 125 MCG TAB: 125 mcg | ORAL | Qty: 1

## 2016-07-03 NOTE — Discharge Summary (Signed)
Discharge  Summary by Gwenith Daily at 07/03/16 1047                Author: Gwenith Daily  Service: Emergency Medicine  Author Type: Physician Assistant       Filed: 07/03/16 1050  Date of Service: 07/03/16 1047  Status: Attested Addendum          Editor: Gwenith Daily       Related Notes: Original Note by Gwenith Daily filed at 07/03/16 1049          Cosigner: Serita Sheller, MD at 07/03/16 1543          Attestation signed by Serita Sheller, MD at 07/03/16 1543          I interviewed and examined the patient. I discussed the patient with the mid-level provider, Enrique Sack, and I agree with the evaluation and plan as documented  here.      I discussed the results of the stress test with the patient. The stress echo was negative for ischemia. We will try some Robaxin for the patient's pain as well. She is to take Tylenol and Motrin for pain. I feel she is safe for discharge at this time.  I've recommended primary care follow-up within the next week.                                 [place]   Emergency ObservationDepartment    Chest Pain Discharge Summary          Patient: Elaine [name]  Age: 65 y.o.  Sex: female          Date of Birth: 1951-09-03  Admit Date: 07/02/2016  PCP: Osie Cheeks, DO     MRN: [id number]   CSN: [id number]   Attending: Meda Coffee         Room: 109/EO09    APP: Lyndel Pleasure        ED Physician   [name]   Discharge Physician    [place]   Observation Admission    07/03/16 at [place]   Observation Discharge   07/03/16 at 64        History of Present Illness     65 y.o. female  was seen in the ED for evaulation of chest pain.  The initial evaluation was unremarkable and subsequently the patient was assigned to ED Observation for further risk stratification to include repeat cardiac enzymes, and if negative, cardiac stress testing.         ED Observation Course     Course in OBS-The patient continued to have very mild sternal chest pain intermittently and did not  develop other symptoms.  Cardiac enzymes were negative and they underwent a EST. The results of which were negative for ischemia.The test was an adequate study with target heart rate achieved.       Her hypokalemia improved to 3.5.        Echo Results  (Last 48 hours)                              07/03/16 0817    ECHO STRESS  Final result            Narrative:    ==================== XCELERA REPORT ========================================  Study ID: [id number]                                                        [facility]                                                        [address] [place], [place]                                     Stress Echocardiogram Report             Name: Elaine [name] Date:       07/03/2016 08:17 AM      MRN: [id number]                  Patient Location: [id number]      DOB: 1950/12/15              Age: 64 yrs      Height: 62 in                Weight: 199 lb                       BSA: 1.9 m2      BP: 123/63 mmHg              HR: 75      Gender: Female               Account #: [id number]      Reason For Study: Chest Pain      Ordering Physician: Dyke Maes      Performed By: Hart Robinsons., RDCS             Interpretation Summary      The Electrocardiographic Interpretation: negative by ECG criteria.      The Echocardiographic Interpretation: negative for ischemia.      The OverAll Impression : negative for ischemia .             _____________________________________________________________________________      _             Interpretation Summary                    Stress Comments      A treadmill exercise test according to Bruce protocol was performed. Normal      baseline electrocardiogram. Normal ST changes with exercise. No arrhythmias      were noted during stress. Test was terminated due  to target heart rate was      achieved. Normal HR and BP responses to exercise. No chest pain.  I      WMSI = 1.00     % Normal = 100                                                                      Rest                                                                                                                      II      WMSI = 1.00     % Normal = 100                                                                      Peak                                                                                                                                                                                      Segments        Size      X - Cannot   1 - Normal  2 -          3 - Akinetic 4 -            1-2          small      Interpret                Hypokinetic               Dyskinetic     3-5   moder      ate      5 -  6-14         large      Aneurysmal                                                       15-16  diffu      se                           Left Ventricle      The left ventricular chamber size at rest is normal. The left ventricular      chamber size at peak stress is smaller.      MEASUREMENTS/CALCULATIONS:                    Electronically signed byDr. Elyn Peers, MD   07/03/2016 10:19 AM                                     All Cardiac Markers in the last 24 hours:      Lab Results         Component  Value  Date/Time            TROPT  0.032  07/03/2016 03:42 AM       TROPT  0.00  07/03/2016 01:25 AM            TROPT  0.00  07/02/2016 09:44 PM                Physical Exam          Visit Vitals         ?  BP  116/80 (BP 1 Location: Right arm, BP Patient Position: Supine)     ?  Pulse  80     ?  Temp  98.1 ??F (36.7 ??C)     ?  Resp  18     ?  Wt  90.3 kg (199 lb)     ?  SpO2  100%         ?  BMI  34.16 kg/m2        Respiratory: Clear and equal breath sounds    Cardiovascular: Heart regular without murmer, thrills, gallop,  rub. Mild tenderness to palpation to the sternum without signs of infection  or trauma.      GI:  Abdomen soft non-tender without complaints of pain to palpation           Clinical Impression/Diagnosis                 ICD-10-CM  ICD-9-CM          1.  Chest pain, unspecified type  R07.9  786.50          2.  Hypokalemia  E87.6  276.8             Disposition and Plan     Patient is discharged home in stable condition. Chest pain is likely musculoskeletal. Prescription for Robaxin provided. She understands not to drive, work or operate machinery while  taking this medication as it can make her drowsy or dizzy. She is also advised to take Tylenol as directed on the packaging, no more than 4000 mg daily for her discomfort. It  was advised to follow up with their primary care physician in one week to have  a recheck. It was discussed with the patient that their cardiac evaluation was unremarkable and does not indicate that immediate intervention is necessary. The patient was counseled that the testing is not 100% accurate and may generate false negatives.  Patient advised to return to the ED for any new or worsening symptoms.       The patient was personally evaluated by myself and Dr. Meda Coffee who agrees with the above assessment and plan.         Dragon medical dictation software was used for portions of this report. Unintended errors may occur.      Gwenith Daily, PA-C   July 03, 2016

## 2016-07-03 NOTE — ED Notes (Signed)
Verbal shift change report given to Rote (Soil scientist) by Francoise Schaumann (offgoing nurse). Report included the following information SBAR, ED Summary, MAR, Recent Results and Med Rec Status.

## 2016-07-03 NOTE — Other (Addendum)
----------  DocumentID: [id number]------------------------------------------------              [facility]                       Patient Education Report         Name: SURENA, MACEWEN                  Date: 07/03/2016    MRN: [id number]                    Time: 1:40:11 AM         Patient ordered video: 'Chest Pain Therapy at [facility]'    from ERO_[email or web address] via phone number: [phone] at 1:40:11 AM    ----------DocumentID: [id number]------------------------------------------------                       [place]          Patient Education Report - Discharge Summary        Date: 07/03/2016   Time: 11:00:14 AM   Name: ELLIONA, DUFFER   MRN: [id number]      Account Number: [id number]      Education History:        Patient ordered video: 'Chest Pain Therapy at [facility]' from ERO_109_[email or web address] on 07/03/2016 01:40:11 AM

## 2016-07-03 NOTE — ED Notes (Signed)
TRANSFER - IN REPORT:    Verbal report received from [place], RN(name) on Kelsa A Zabinski  being received from ED OBS(unit) for routine progression of care      Report consisted of patient???s Situation, Background, Assessment and   Recommendations(SBAR).     Information from the following report(s) SBAR, ED Summary, [facility] and Recent Results was reviewed with the receiving nurse.    Opportunity for questions and clarification was provided.      Assessment completed upon patient???s arrival to unit and care assumed.

## 2016-07-03 NOTE — ED Notes (Signed)
Orange juice and blanket provided to patient

## 2016-07-03 NOTE — Discharge Summary (Addendum)
[place]  Emergency ObservationDepartment   Chest Pain Discharge Summary    Patient: Elaine [name] Age: 65 y.o. Sex: female    Date of Birth: August 31, 1951 Admit Date: 07/02/2016 PCP: Osie Cheeks, DO   MRN: [id number]  CSN: [id number]  Attending: Meda Coffee   Room: 109/EO09  APP: Lyndel Pleasure     ED Physician  [facility]  Discharge Physician   [place]  Observation Admission   07/03/16 at [place]  Observation Discharge  07/03/16 at 82    History of Present Illness   65 y.o. female was seen in the ED for evaulation of chest pain.  The initial evaluation was unremarkable and subsequently the patient was assigned to ED Observation for further risk stratification to include repeat cardiac enzymes, and if negative, cardiac stress testing.     ED Observation Course   Course in OBS-The patient continued to have very mild sternal chest pain intermittently and did not develop other symptoms. Cardiac enzymes were negative and they underwent a EST. The results of which were negative for ischemia.The test was an adequate study with target heart rate achieved.     Her hypokalemia improved to 3.5.    Echo Results  (Last 48 hours)               07/03/16 0817  ECHO STRESS Final result    Narrative:  ==================== XCELERA REPORT ========================================                                                                        Study ID: [id number]                                                     [facility]                                                     [address] [place], [place]                               Stress Echocardiogram Report       Name: Elaine [name] Date:       07/03/2016 08:17 AM   MRN: [id number]                  Patient Location: [id number]   DOB: [birth year]              Age: 47 yrs   Height: 62 in                Weight:  199 lb                       BSA: 1.9 m2    BP: 123/63 mmHg              HR: 75   Gender: Female               Account #: [id number]   Reason For Study: Chest Pain   Ordering Physician: Dyke Maes   Performed By: Hart Robinsons., RDCS       Interpretation Summary   The Electrocardiographic Interpretation: negative by ECG criteria.   The Echocardiographic Interpretation: negative for ischemia.   The OverAll Impression : negative for ischemia .       _____________________________________________________________________________   _       Interpretation Summary           Stress Comments   A treadmill exercise test according to Bruce protocol was performed. Normal   baseline electrocardiogram. Normal ST changes with exercise. No arrhythmias   were noted during stress. Test was terminated due to target heart rate was   achieved. Normal HR and BP responses to exercise. No chest pain.           I      WMSI = 1.00     % Normal = 100                                                                   Rest                                                                   II      WMSI = 1.00     % Normal = 100                                                                   Peak                                                                                                                                   Segments     Size   X - Cannot   1 - Normal  2 -  3 - Akinetic 4 -            1-2       small   Interpret                Hypokinetic               Dyskinetic     3-5   moder   ate   5 -                                                               6-14      large   Aneurysmal                                                       15-16  diffu   se               Left Ventricle   The left ventricular chamber size at rest is normal. The left ventricular   chamber size at peak stress is smaller.   MEASUREMENTS/CALCULATIONS:           Electronically signed byDr. Elyn Peers, MD   07/03/2016 10:19 AM                    All Cardiac Markers in the last 24 hours:   Lab Results   Component Value Date/Time    TROPT 0.032 07/03/2016 03:42 AM    TROPT 0.00 07/03/2016 01:25 AM    TROPT 0.00 07/02/2016 09:44 PM         Physical Exam     Visit Vitals   ??? BP 116/80 (BP 1 Location: Right arm, BP Patient Position: Supine)   ??? Pulse 80   ??? Temp 98.1 ??F (36.7 ??C)   ??? Resp 18   ??? Wt 90.3 kg (199 lb)   ??? SpO2 100%   ??? BMI 34.16 kg/m2     Respiratory: Clear and equal breath sounds   Cardiovascular: Heart regular without murmer, thrills, gallop, rub. Mild tenderness to palpation to the sternum without signs of infection or trauma.     GI:  Abdomen soft non-tender without complaints of pain to palpation      Clinical Impression/Diagnosis       ICD-10-CM ICD-9-CM   1. Chest pain, unspecified type R07.9 786.50   2. Hypokalemia E87.6 276.8       Disposition and Plan   Patient is discharged home in stable condition. Chest pain is likely musculoskeletal. Prescription for Robaxin provided. She understands not to drive, work or operate machinery while taking this medication as it can make her drowsy or dizzy. She is also advised to take Tylenol as directed on the packaging, no more than 4000 mg daily for her discomfort. It was advised to follow up with their primary care physician in one week to have a recheck. It was discussed with the patient that their cardiac evaluation was unremarkable and does not indicate that immediate intervention is necessary. The patient was counseled that the testing is not 100% accurate and may generate false negatives. Patient advised to  return to the ED for any new or worsening symptoms.     The patient was personally evaluated by myself and Dr. Meda Coffee who agrees with the above assessment and plan.      Dragon medical dictation software was used for portions of this report. Unintended errors may occur.    Gwenith Daily, PA-C  July 03, 2016

## 2016-07-03 NOTE — ED Notes (Signed)
Change of shift report received Vanessa RN(name) on [place] .  Report consisted of patient???s Situation, Background, Assessment and Recommendations(SBAR). Information from the following report(s) SBAR and Kardex was reviewed with the off going  Nurse. Opportunity for questions and clarification was provided.

## 2016-07-03 NOTE — ED Progress Note (Signed)
Patient is currently resting in the room. Vital signs remained stable. Troponins are negative ??3. No complaints per nursing staff. Continue chest pain protocol.

## 2016-07-03 NOTE — Progress Notes (Signed)
Stress echo done

## 2016-07-03 NOTE — ED Notes (Signed)
Pt ambulated to bathroom

## 2016-07-03 NOTE — ED Notes (Signed)
I have reviewed discharge instructions with the patient.  The patient verbalized understanding.Patient armband removed and given to patient to take home.  Patient was informed of the privacy risks if armband lost or stolen

## 2016-07-06 NOTE — Progress Notes (Signed)
Attempted to contact patient post [facility] ED visit 07/02/16. Unable to leave message on voice mail. Will continue to follow.

## 2016-07-07 NOTE — Progress Notes (Signed)
2nd attempt made to contact patient post [facility] ED visit 07/02/16. Unable to leave voice message. Will continue to follow.

## 2016-07-13 ENCOUNTER — Encounter: Attending: Internal Medicine | Primary: Internal Medicine

## 2016-07-21 ENCOUNTER — Ambulatory Visit
Admit: 2016-07-21 | Discharge: 2016-07-21 | Payer: PRIVATE HEALTH INSURANCE | Attending: Internal Medicine | Primary: Internal Medicine

## 2016-07-21 DIAGNOSIS — Z09 Encounter for follow-up examination after completed treatment for conditions other than malignant neoplasm: Secondary | ICD-10-CM

## 2016-07-21 NOTE — Progress Notes (Signed)
Chief Complaint   Patient presents with   ??? Dizziness   ??? Chest Pain     hospital    ??? Annual Wellness Visit         HPI:  Patient is a 65 year old African American female with medical problems listed below presents today post hospital discharge. She was admitted at [facility] ED Observation unit on 07/02/16 and was discharged on 07/03/16. She was initially seen at [facility] and was sent to [facility] ED for evaluation of chest pain. In the ED, she had chest imaging done that was negative and also had cardiac enzymes checked that were negative and she underwent exercise stress test that was negative for ischemia. The chest pain was deemed musculoskeletal in origin and she was discharged on Robaxin. Labs done in the ED revealed hypokalemia as K was initially low at 2.9 and improved to 3.5 with supplementation. She has been feeling fine ever since and denies CP, SOB, nausea, vomiting, headache, dizziness, or other complaints today. She is complaint with her medications with no adverse effects reported.        Past Medical History:   Diagnosis Date   ??? Anemia    ??? Asthma    ??? Colon polyp    ??? DDD (degenerative disc disease) 02/15/2009   ??? Depression 12/18/2011   ??? Essential hypertension, benign    ??? History of echocardiogram 03/20/2004    EF 70%.  Borderline DDfx.  No significant valvular pathology.   ??? Hypercholesterolemia    ??? Hypertension    ??? Iron deficiency anemia 99991111   ??? 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 Lower extremity venous duplex 10/23/2009    Left leg:  No DVT.     ??? Meralgia paraesthetica 10/22/2013   ??? OA (osteoarthritis of spine) 02/15/2009   ??? OA (osteoarthritis of spine) 02/15/2009   ??? Obesity, unspecified    ??? Other and unspecified hyperlipidemia    ??? Pre-operative cardiovascular examination     For spine surgery   ??? Right sided sciatica    ??? Shortness of breath     Possible asthma, HCVD; less likely CAD (Noted 03/15/09)   ??? Sleep apnea 02/15/2009    uses cpap machine   ??? Thallium stress test  03/20/2004     Partially transient, mod basal & mid anterior defect most c/w artifact; mild anterior ischemia less likely.  Neg EKG on pharm stress test.   ??? Thyroid disease     hypothyroidism     Allergies   Allergen Reactions   ??? Asa-Acetaminophen-Caff-Potass Other (comments)     Bleeding in stomach   ??? Benadryl [Diphenhydramine Hcl] Other (comments)     Muscle jerking   ??? Other Plant, Animal, Environmental Not Reported This Time     Grass, Dust and Mold   ??? Pollen Extracts Not Reported This Time     Current Outpatient Prescriptions   Medication Sig Dispense Refill   ??? methocarbamol (ROBAXIN) 500 mg tablet Take 1 Tab by mouth three (3) times daily. 12 Tab 0   ??? valsartan-hydroCHLOROthiazide (DIOVAN-HCT) 160-25 mg per tablet take 1 tablet by mouth daily 30 Tab 3   ??? levothyroxine (SYNTHROID) 125 mcg tablet Take 1 Tab by mouth Daily (before breakfast). 30 Tab 3   ??? gabapentin (NEURONTIN) 300 mg capsule take 1 capsule by mouth every morning and 2 at bedtime 90 Cap 3   ??? levothyroxine (SYNTHROID) 112 mcg tablet Take 1 Tab by mouth Daily (before breakfast). [place]  Tab 3   ??? montelukast (SINGULAIR) 10 mg tablet Take 1 Tab by mouth nightly as needed. Indications: asthma 90 Tab 1   ??? triamcinolone acetonide (KENALOG) 0.1 % topical cream Apply  to affected area two (2) times a day. use thin layer 60 g 0   ??? omeprazole (PRILOSEC) 40 mg capsule TAKE 1 CAPSULE BY MOUTH ONCE A DAY BEFORE BREAKFAST 30 Cap 5   ??? VARICELLA-ZOSTER VACINE LIVE 19,400 unit/0.65 mL susr injection   0   ??? gabapentin (NEURONTIN) 300 mg capsule take 1 capsule by mouth every morning and 2 capsules by mouth at bedtime 180 Cap 1   ??? ranitidine (ZANTAC) 150 mg tablet Take 1 Tab by mouth two (2) times a day. 180 Tab 1   ??? budesonide-formoterol (SYMBICORT) 160-4.5 mcg/actuation HFA inhaler Take 2 Puffs by inhalation two (2) times a day. 1 Inhaler 5   ??? IRON DEXTRAN COMPLEX (IRON DEXTRAN IV) by IntraVENous route.      ??? Cholecalciferol, Vitamin D3, (VITAMIN D-3) 5,000 unit Tab Take  by mouth daily.            ROS:  Constitutional: Negative for fever, chills, or fatigue  Neurological: Negative for headache, dizziness, visual disturbance, or loss of consciousness  Respiratory: Negative for SOB, hemoptysis, or wheezing  Cardiovascular: Negative for chest pain, palpitation, or leg swelling  Gastrointestinal: Negative for abdominal pain, nausea, vomiting, diarrhea, blood in stool, melena, or heartburn  Musculoskeletal: Negative for falls          Physical Exam:  Visit Vitals   ??? BP 138/70 (BP 1 Location: Left arm, BP Patient Position: Sitting)   ??? Pulse 94   ??? Temp 98.1 ??F (36.7 ??C) (Oral)   ??? Resp 18   ??? Ht 5\' 4"  (1.626 m)   ??? Wt 199 lb (90.3 kg)   ??? SpO2 98%   ??? BMI 34.16 kg/m2     General: Obese black female, a & o x 3, afebrile, interacting appropriately, in no acute distress  Skin: warm and dry, no rashes , no bruises  Neck: supple, symmetrical, no thyromegaly  Respiratory: symmetrical chest expansion, lung sounds clear bilaterally, good air entry, good respiratory effort, no wheezes or crackles  Cardiovascular: normal S1S2, regular rate and rhythm, no murmurs, pulses palpable, no thrill, no carotid or abdominal bruits, no JVD  Abdomen: non-distended, normoactive bowel sounds x 4 quadrants, soft, non-tender to palpation  Musculoskeletal: normal ROM on all joints, no swelling or deformity, no perilumbar tenderness, steady gait  Extremity: No edema noted        Assessment/Plan:    ICD-10-CM ICD-9-CM    1. Hospital discharge follow-up Z09 [facility] discharge follow up was completed at the office today.   2. Chest pain, unspecified type R07.9 786.50 Resolved   3. Hypercholesteremia E78.00 272.0 She was counseled on low fat diet.   4. HTN (hypertension), benign I10 401.1 Controlled.  Continue current meds and she was counseled on low salt diet.   5. Pre-diabetes R73.03 790.29 Counseled on diet and exercise.   HEMOGLOBIN A1C W/O EAG         Orders Placed This Encounter   ??? TSH 3RD GENERATION     Standing Status:   Future     Standing Expiration Date:   11/18/2016   ??? T4, FREE     Standing Status:   Future     Standing Expiration Date:   01/17/2017   ??? HEMOGLOBIN A1C W/O EAG  Standing Status:   Future     Standing Expiration Date:   07/22/2017       Review of records of recent admission was done by me.    Recent labs done at the ED was reviewed    Recent stress test results was reviewed        Additional Notes: Discussed today's diagnosis, treatment plans. Discussed medication indications and side effects.    Weight Management:Counseled  After Visit Summary: Discussed provided printed patient instructions. Answered questions accordingly.  Follow-up Disposition: In 3 months with labs 1 week prior          Osie Cheeks, DO, MPH  Internal Medicine        Total time taken for today's visit was 40 minutes with greater than 50% of time spent involved in counseling and coordination of care as outlined above.

## 2016-07-21 NOTE — Progress Notes (Signed)
Chief Complaint   Patient presents with   ??? Dizziness   ??? Chest Pain     hospital    Patient is here today for hospital F/U.  Patient sts she still has tentureness in her chest. Patient will need MCR exam and Colon scaning.  1. Have you been to the ER, urgent care clinic since your last visit?  Hospitalized since your last visit?Yes [facility] 07/02/16 for Dizzines and chest pain.    2. Have you seen or consulted any other health care providers outside of the [facility] since your last visit?  Include any pap smears or colon screening. No

## 2016-07-21 NOTE — Patient Instructions (Addendum)
A Healthy Lifestyle: Care Instructions  Your Care Instructions  A healthy lifestyle can help you feel good, stay at a healthy weight, and have plenty of energy for both work and play. A healthy lifestyle is something you can share with your whole family.  A healthy lifestyle also can lower your risk for serious health problems, such as high blood pressure, heart disease, and diabetes.  You can follow a few steps listed below to improve your health and the health of your family.  Follow-up care is a key part of your treatment and safety. Be sure to make and go to all appointments, and call your doctor if you are having problems. It???s also a good idea to know your test results and keep a list of the medicines you take.  How can you care for yourself at home?  ?? Do not eat too much sugar, fat, or fast foods. You can still have dessert and treats now and then. The goal is moderation.  ?? Start small to improve your eating habits. Pay attention to portion sizes, drink less juice and soda pop, and eat more fruits and vegetables.  ?? Eat a healthy amount of food. A 3-ounce serving of meat, for example, is about the size of a deck of cards. Fill the rest of your plate with vegetables and whole grains.  ?? Limit the amount of soda and sports drinks you have every day. Drink more water when you are thirsty.  ?? Eat at least 5 servings of fruits and vegetables every day. It may seem like a lot, but it is not hard to reach this goal. A serving or helping is 1 piece of fruit, 1 cup of vegetables, or 2 cups of leafy, raw vegetables. Have an apple or some carrot sticks as an afternoon snack instead of a candy bar. Try to have fruits and/or vegetables at every meal.  ?? Make exercise part of your daily routine. You may want to start with simple activities, such as walking, bicycling, or slow swimming. Try to be active 30 to 60 minutes every day. You do not need to do all 30 to 60  minutes all at once. For example, you can exercise 3 times a day for 10 or 20 minutes. Moderate exercise is safe for most people, but it is always a good idea to talk to your doctor before starting an exercise program.  ?? Keep moving. Mow the lawn, work in the garden, or clean your house. Take the stairs instead of the elevator at work.  ?? If you smoke, quit. People who smoke have an increased risk for heart attack, stroke, cancer, and other lung illnesses. Quitting is hard, but there are ways to boost your chance of quitting tobacco for good.  ?? Use nicotine gum, patches, or lozenges.  ?? Ask your doctor about stop-smoking programs and medicines.  ?? Keep trying.  In addition to reducing your risk of diseases in the future, you will notice some benefits soon after you stop using tobacco. If you have shortness of breath or asthma symptoms, they will likely get better within a few weeks after you quit.  ?? Limit how much alcohol you drink. Moderate amounts of alcohol (up to 2 drinks a day for men, 1 drink a day for women) are okay. But drinking too much can lead to liver problems, high blood pressure, and other health problems.  Family health  If you have a family, there are many things you can do   together to improve your health.  ?? Eat meals together as a family as often as possible.  ?? Eat healthy foods. This includes fruits, vegetables, lean meats and dairy, and whole grains.  ?? Include your family in your fitness plan. Most people think of activities such as jogging or tennis as the way to fitness, but there are many ways you and your family can be more active. Anything that makes you breathe hard and gets your heart pumping is exercise. Here are some tips:  ?? Walk to do errands or to take your child to school or the bus.  ?? Go for a family bike ride after dinner instead of watching TV.  Where can you learn more?  Go to [employer].   Enter [phone] in the search box to learn more about "A Healthy Lifestyle: Care Instructions."  Current as of: July 02, 2015  Content Version: 11.3  ?? 2006-2017 [employer]. Care instructions adapted under license by [employer] (which disclaims liability or warranty for this information). If you have questions about a medical condition or this instruction, always ask your healthcare professional. [place] any warranty or liability for your use of this information.       Musculoskeletal Chest Pain: Care Instructions  Your Care Instructions  Chest pain is not always a sign that something is wrong with your heart or that you have another serious problem. The doctor thinks your chest pain is caused by strained muscles or ligaments, inflamed chest cartilage, or another problem in your chest, rather than by your heart. You may need more tests to find the cause of your chest pain.  Follow-up care is a key part of your treatment and safety. Be sure to make and go to all appointments, and call your doctor if you are having problems. It???s also a good idea to know your test results and keep a list of the medicines you take.  How can you care for yourself at home?  ?? Take pain medicines exactly as directed.  ?? If the doctor gave you a prescription medicine for pain, take it as prescribed.  ?? If you are not taking a prescription pain medicine, ask your doctor if you can take an over-the-counter medicine.  ?? Rest and protect the sore area.  ?? Stop, change, or take a break from any activity that may be causing your pain or soreness.  ?? Put ice or a cold pack on the sore area for 10 to 20 minutes at a time. Try to do this every 1 to 2 hours for the next 3 days (when you are awake) or until the swelling goes down. Put a thin cloth between the ice and your skin.  ?? After 2 or 3 days, apply a heating pad set on low or a warm cloth to the  area that hurts. Some doctors suggest that you go back and forth between hot and cold.  ?? Do not wrap or tape your ribs for support. This may cause you to take smaller breaths, which could increase your risk of lung problems.  ?? Mentholated creams such as Bengay or Icy Hot may soothe sore muscles. Follow the instructions on the package.  ?? Follow your doctor's instructions for exercising.  ?? Gentle stretching and massage may help you get better faster. Stretch slowly to the point just before pain begins, and hold the stretch for at least 15 to 30 seconds. Do this 3 or 4 times a day. Stretch  just after you have applied heat.  ?? As your pain gets better, slowly return to your normal activities. Any increased pain may be a sign that you need to rest a while longer.  When should you call for help?  Call 911 anytime you think you may need emergency care. For example, call if:  ?? You have chest pain or pressure. This may occur with:  ?? Sweating.  ?? Shortness of breath.  ?? Nausea or vomiting.  ?? Pain that spreads from the chest to the neck, jaw, or one or both shoulders or arms.  ?? Dizziness or lightheadedness.  ?? A fast or uneven pulse.  After calling 911, chew 1 adult-strength aspirin. Wait for an ambulance. Do not try to drive yourself.  ?? You have sudden chest pain and shortness of breath, or you cough up blood.  Call your doctor now or seek immediate medical care if:  ?? You have any trouble breathing.  ?? Your chest pain gets worse.  ?? Your chest pain occurs consistently with exercise and is relieved by rest.  Watch closely for changes in your health, and be sure to contact your doctor if:  ?? Your chest pain does not get better after 1 week.  Where can you learn more?  Go to [employer].  Enter [phone] in the search box to learn more about "Musculoskeletal Chest Pain: Care Instructions."  Current as of: February 24, 2016  Content Version: 11.3   ?? 2006-2017 [employer]. Care instructions adapted under license by [employer] (which disclaims liability or warranty for this information). If you have questions about a medical condition or this instruction, always ask your healthcare professional. [place] any warranty or liability for your use of this information.

## 2016-07-30 DIAGNOSIS — Z0000 Encounter for general adult medical examination without abnormal findings: Secondary | ICD-10-CM | POA: Diagnosis not present

## 2016-07-30 DIAGNOSIS — Z23 Encounter for immunization: Secondary | ICD-10-CM | POA: Diagnosis not present

## 2016-08-04 ENCOUNTER — Encounter: Payer: Medicare Other | Attending: Physician Assistant | Admitting: Skilled Nursing Facility1

## 2016-08-04 ENCOUNTER — Encounter: Payer: Self-pay | Admitting: Skilled Nursing Facility1

## 2016-08-04 DIAGNOSIS — Z713 Dietary counseling and surveillance: Secondary | ICD-10-CM | POA: Insufficient documentation

## 2016-08-04 DIAGNOSIS — E119 Type 2 diabetes mellitus without complications: Secondary | ICD-10-CM | POA: Diagnosis not present

## 2016-08-04 NOTE — Progress Notes (Signed)

## 2016-08-06 ENCOUNTER — Encounter: Payer: Self-pay | Admitting: Skilled Nursing Facility1

## 2016-08-11 ENCOUNTER — Encounter: Payer: Medicare Other | Attending: Physician Assistant | Admitting: Dietician

## 2016-08-11 DIAGNOSIS — E119 Type 2 diabetes mellitus without complications: Secondary | ICD-10-CM | POA: Insufficient documentation

## 2016-08-11 DIAGNOSIS — Z713 Dietary counseling and surveillance: Secondary | ICD-10-CM | POA: Insufficient documentation

## 2016-08-11 MED ORDER — GABAPENTIN 300 MG CAP
300 mg | ORAL_CAPSULE | ORAL | 3 refills | Status: DC
Start: 2016-08-11 — End: 2016-10-20

## 2016-08-11 MED ORDER — MONTELUKAST 10 MG TAB
10 mg | ORAL_TABLET | ORAL | 1 refills | Status: DC
Start: 2016-08-11 — End: 2017-02-26

## 2016-08-11 NOTE — Telephone Encounter (Signed)
Singulair and Gabapentin refilled and sent to pharmacy. Please notify pt.

## 2016-08-11 NOTE — Progress Notes (Signed)

## 2016-08-12 NOTE — Telephone Encounter (Signed)
Patient aware medication was sent to the pharmacy.  Patient verbalizes understanding.

## 2016-08-18 ENCOUNTER — Encounter: Payer: Medicare Other | Admitting: Skilled Nursing Facility1

## 2016-08-18 DIAGNOSIS — Z713 Dietary counseling and surveillance: Secondary | ICD-10-CM | POA: Diagnosis not present

## 2016-08-18 DIAGNOSIS — E119 Type 2 diabetes mellitus without complications: Secondary | ICD-10-CM

## 2016-08-18 MED ORDER — ATORVASTATIN 40 MG TAB
40 mg | ORAL_TABLET | ORAL | 3 refills | Status: DC
Start: 2016-08-18 — End: 2016-10-20

## 2016-08-18 NOTE — Telephone Encounter (Signed)
Lipitor refilled and sent to pharmacy. Please notify pt.

## 2016-08-19 ENCOUNTER — Encounter: Payer: Self-pay | Admitting: Skilled Nursing Facility1

## 2016-08-19 ENCOUNTER — Encounter

## 2016-08-19 NOTE — Telephone Encounter (Signed)
Tried to leave message for patient, unable to leave message recording on phone stated thank you for calling and there was brief silence and the phone was disconnected.

## 2016-08-19 NOTE — Progress Notes (Signed)
Patient was seen on 08/18/2016 for the third of a series of three diabetes self-management courses at the [facility]. The following learning objectives were met by the patient during this class:  . State the amount of activity recommended for healthy living . Describe activities suitable for individual needs . Identify ways to regularly incorporate activity into daily life . Identify barriers to activity and ways to over come these barriers  Identify diabetes medications being personally used and their primary action for lowering glucose and possible side effects . Describe role of stress on blood glucose and develop strategies to address psychosocial issues . Identify diabetes complications and ways to prevent them  Explain how to manage diabetes during illness . Evaluate success in meeting personal goal . Establish 2-3 goals that they will plan to diligently work on until they return for the  30-monthfollow-up visit  Goals:   I will count my carb choices at most meals and snacks  I will be active 30 minutes or more 3 times a week  To help manage stress I will  Do something I like at least 3 times a week  Your patient has identified these potential barriers to change:  Motivation  Your patient has identified their diabetes self-care support plan as  [facility]:  Attend Monthly Diabetes Support Group as needed or make a future follow up appointment

## 2016-09-29 DIAGNOSIS — K219 Gastro-esophageal reflux disease without esophagitis: Secondary | ICD-10-CM | POA: Diagnosis not present

## 2016-09-29 DIAGNOSIS — I10 Essential (primary) hypertension: Secondary | ICD-10-CM | POA: Diagnosis not present

## 2016-09-29 DIAGNOSIS — E119 Type 2 diabetes mellitus without complications: Secondary | ICD-10-CM | POA: Diagnosis not present

## 2016-09-29 DIAGNOSIS — M109 Gout, unspecified: Secondary | ICD-10-CM | POA: Diagnosis not present

## 2016-09-29 DIAGNOSIS — E785 Hyperlipidemia, unspecified: Secondary | ICD-10-CM | POA: Diagnosis not present

## 2016-10-19 ENCOUNTER — Encounter

## 2016-10-20 ENCOUNTER — Encounter

## 2016-10-20 ENCOUNTER — Ambulatory Visit
Admit: 2016-10-20 | Discharge: 2016-10-20 | Payer: PRIVATE HEALTH INSURANCE | Attending: Internal Medicine | Primary: Internal Medicine

## 2016-10-20 ENCOUNTER — Encounter: Attending: Internal Medicine | Primary: Internal Medicine

## 2016-10-20 DIAGNOSIS — I10 Essential (primary) hypertension: Secondary | ICD-10-CM

## 2016-10-20 MED ORDER — OMEPRAZOLE 40 MG CAP, DELAYED RELEASE
40 mg | ORAL_CAPSULE | ORAL | 5 refills | Status: DC
Start: 2016-10-20 — End: 2017-05-29

## 2016-10-20 MED ORDER — METHOCARBAMOL 500 MG TAB
500 mg | ORAL_TABLET | Freq: Three times a day (TID) | ORAL | 0 refills | Status: DC
Start: 2016-10-20 — End: 2016-11-26

## 2016-10-20 MED ORDER — ATORVASTATIN 40 MG TAB
40 mg | ORAL_TABLET | ORAL | 3 refills | Status: DC
Start: 2016-10-20 — End: 2017-03-20

## 2016-10-20 MED ORDER — GABAPENTIN 300 MG CAP
300 mg | ORAL_CAPSULE | ORAL | 1 refills | Status: DC
Start: 2016-10-20 — End: 2018-03-21

## 2016-10-20 NOTE — Patient Instructions (Signed)
DASH Diet: Care Instructions  Your Care Instructions    The DASH diet is an eating plan that can help lower your blood pressure. DASH stands for Dietary Approaches to Stop Hypertension. Hypertension is high blood pressure.  The DASH diet focuses on eating foods that are high in calcium, potassium, and magnesium. These nutrients can lower blood pressure. The foods that are highest in these nutrients are fruits, vegetables, low-fat dairy products, nuts, seeds, and legumes. But taking calcium, potassium, and magnesium supplements instead of eating foods that are high in those nutrients does not have the same effect. The DASH diet also includes whole grains, fish, and poultry.  The DASH diet is one of several lifestyle changes your doctor may recommend to lower your high blood pressure. Your doctor may also want you to decrease the amount of sodium in your diet. Lowering sodium while following the DASH diet can lower blood pressure even further than just the DASH diet alone.  Follow-up care is a key part of your treatment and safety. Be sure to make and go to all appointments, and call your doctor if you are having problems. It's also a good idea to know your test results and keep a list of the medicines you take.  How can you care for yourself at home?  Following the DASH diet  ?? Eat 4 to 5 servings of fruit each day. A serving is 1 medium-sized piece of fruit, ?? cup chopped or canned fruit, 1/4 cup dried fruit, or 4 ounces (?? cup) of fruit juice. Choose fruit more often than fruit juice.  ?? Eat 4 to 5 servings of vegetables each day. A serving is 1 cup of lettuce or raw leafy vegetables, ?? cup of chopped or cooked vegetables, or 4 ounces (?? cup) of vegetable juice. Choose vegetables more often than vegetable juice.  ?? Get 2 to 3 servings of low-fat and fat-free dairy each day. A serving is 8 ounces of milk, 1 cup of yogurt, or 1 ?? ounces of cheese.   ?? Eat 6 to 8 servings of grains each day. A serving is 1 slice of bread, 1 ounce of dry cereal, or ?? cup of cooked rice, pasta, or cooked cereal. Try to choose whole-grain products as much as possible.  ?? Limit lean meat, poultry, and fish to 2 servings each day. A serving is 3 ounces, about the size of a deck of cards.  ?? Eat 4 to 5 servings of nuts, seeds, and legumes (cooked dried beans, lentils, and split peas) each week. A serving is 1/3 cup of nuts, 2 tablespoons of seeds, or ?? cup of cooked beans or peas.  ?? Limit fats and oils to 2 to 3 servings each day. A serving is 1 teaspoon of vegetable oil or 2 tablespoons of salad dressing.  ?? Limit sweets and added sugars to 5 servings or less a week. A serving is 1 tablespoon jelly or jam, ?? cup sorbet, or 1 cup of lemonade.  ?? Eat less than 2,300 milligrams (mg) of sodium a day. If you limit your sodium to 1,500 mg a day, you can lower your blood pressure even more.  Tips for success  ?? Start small. Do not try to make dramatic changes to your diet all at once. You might feel that you are missing out on your favorite foods and then be more likely to not follow the plan. Make small changes, and stick with them. Once those changes become habit, add a   few more changes.  ?? Try some of the following:  ?? Make it a goal to eat a fruit or vegetable at every meal and at snacks. This will make it easy to get the recommended amount of fruits and vegetables each day.  ?? Try yogurt topped with fruit and nuts for a snack or healthy dessert.  ?? Add lettuce, tomato, cucumber, and onion to sandwiches.  ?? Combine a ready-made pizza crust with low-fat mozzarella cheese and lots of vegetable toppings. Try using tomatoes, squash, spinach, broccoli, carrots, cauliflower, and onions.  ?? Have a variety of cut-up vegetables with a low-fat dip as an appetizer instead of chips and dip.  ?? Sprinkle sunflower seeds or chopped almonds over salads. Or try adding  chopped walnuts or almonds to cooked vegetables.  ?? Try some vegetarian meals using beans and peas. Add garbanzo or kidney beans to salads. Make burritos and tacos with mashed pinto beans or black beans.  Where can you learn more?  Go to http://www.healthwise.net/[employer]Help[employer]ctions.  Enter H967 in the search box to learn more about "DASH Diet: Care Instructions."  Current as of: August 28, 2015  Content Version: 11.4  ?? 2006-2017 [employer]. Care instructions adapted under license by [employer] (which disclaims liability or warranty for this information). If you have questions about a medical condition or this instruction, always ask your healthcare professional. [employer] disclaims any warranty or liability for your use of this information.       Prediabetes: Care Instructions  Your Care Instructions    Prediabetes is a warning sign that you are at risk for getting type 2 diabetes. It means that your blood sugar is higher than it should be. The food you eat turns into sugar, which your body uses for energy. Normally, an organ called the pancreas makes insulin, which allows the sugar in your blood to get into your body's cells. But when your body can't use insulin the right way, the sugar doesn't move into cells. It stays in your blood instead. This is called insulin resistance. The buildup of sugar in the blood causes prediabetes.  The good news is that lifestyle changes may help you get your blood sugar back to normal and help you avoid or delay diabetes.  Follow-up care is a key part of your treatment and safety. Be sure to make and go to all appointments, and call your doctor if you are having problems. It's also a good idea to know your test results and keep a list of the medicines you take.  How can you care for yourself at home?  ?? Watch your weight. A healthy weight helps your body use insulin properly.   ?? Limit the amount of calories, sweets, and unhealthy fat you eat. Ask your doctor if you should see a dietitian. A registered dietitian can help you create meal plans that fit your lifestyle.  ?? Get at least 30 minutes of exercise on most days of the week. Exercise helps control your blood sugar. It also helps you maintain a healthy weight. Walking is a good choice. You also may want to do other activities, such as running, swimming, cycling, or playing tennis or team sports.  ?? Do not smoke. Smoking can make prediabetes worse. If you need help quitting, talk to your doctor about stop-smoking programs and medicines. These can increase your chances of quitting for good.  ?? If your doctor prescribed medicines, take them exactly as prescribed. Call your doctor if you   think you are having a problem with your medicine. You will get more details on the specific medicines your doctor prescribes.  When should you call for help?  Watch closely for changes in your health, and be sure to contact your doctor if:  ? ?? You have any symptoms of diabetes. These may include:  ?? Being thirsty more often.  ?? Urinating more.  ?? Being hungrier.  ?? Losing weight.  ?? Being very tired.  ?? Having blurry vision.   ? ?? You have a wound that will not heal.   ? ?? You have an infection that will not go away.   ? ?? You have problems with your blood pressure.   ? ?? You want more information about diabetes and how you can keep from getting it.   Where can you learn more?  Go to http://www.healthwise.net/GoodHelpConnections.  Enter I222 in the search box to learn more about "Prediabetes: Care Instructions."  Current as of: February 17, 2016  Content Version: 11.4  ?? 2006-2017 [employer]. Care instructions adapted under license by [employer] (which disclaims liability or warranty for this information). If you have questions about a medical condition or this instruction, always ask your healthcare professional. [employer]  [employer]corpor[employer] disclaims any warranty or liability for your use of this information.       Starting a Weight Loss Plan: Care Instructions  Your Care Instructions    If you are thinking about losing weight, it can be hard to know where to start. Your doctor can help you set up a weight loss plan that best meets your needs. You may want to take a class on nutrition or exercise, or join a weight loss support group. If you have questions about how to make changes to your eating or exercise habits, ask your doctor about seeing a registered dietitian or an exercise specialist.  It can be a big challenge to lose weight. But you do not have to make huge changes at once. Make small changes, and stick with them. When those changes become habit, add a few more changes.  If you do not think you are ready to make changes right now, try to pick a date in the future. Make an appointment to see your doctor to discuss whether the time is right for you to start a plan.  Follow-up care is a key part of your treatment and safety. Be sure to make and go to all appointments, and call your doctor if you are having problems. It's also a good idea to know your test results and keep a list of the medicines you take.  How can you care for yourself at home?  ?? Set realistic goals. Many people expect to lose much more weight than is likely. A weight loss of 5% to 10% of your body weight may be enough to improve your health.  ?? Get family and friends involved to provide support. Talk to them about why you are trying to lose weight, and ask them to help. They can help by participating in exercise and having meals with you, even if they may be eating something different.  ?? Find what works best for you. If you do not have time or do not like to cook, a program that offers meal replacement bars or shakes may be better for you. Or if you like to prepare meals, finding a plan that includes daily menus and recipes may be best.   ?? Ask your doctor   about other health professionals who can help you achieve your weight loss goals.  ?? A dietitian can help you make healthy changes in your diet.  ?? An exercise specialist or personal trainer can help you develop a safe and effective exercise program.  ?? A counselor or psychiatrist can help you cope with issues such as depression, anxiety, or family problems that can make it hard to focus on weight loss.  ?? Consider joining a support group for people who are trying to lose weight. Your doctor can suggest groups in your area.  Where can you learn more?  Go to http://www.healthwise.net/GoodHelpConnections.  Enter U357 in the search box to learn more about "Starting a Weight Loss Plan: Care Instructions."  Current as of: September 19, 2015  Content Version: 11.4  ?? 2006-2017 [employer]. Care instructions adapted under license by [employer] (which disclaims liability or warranty for this information). If you have questions about a medical condition or this instruction, always ask your healthcare professional. [employer] disclaims any warranty or liability for your use of this information.

## 2016-10-20 NOTE — Progress Notes (Signed)
Chief Complaint   Patient presents with   ??? Results   Patient is here today for F/U.  Pt has concerns with pain in chest when going upstairs..        1. Have you been to the ER, urgent care clinic since your last visit?  Hospitalized since your last visit?No    2. Have you seen or consulted any other health care providers outside of the [facility] since your last visit?  Include any pap smears or colon screening. No

## 2016-10-20 NOTE — Progress Notes (Signed)
Chief Complaint   Patient presents with   ??? Results         HPI:  Patient is a 65 year old obese black female with medical problems listed below presents for follow up of Hypertension, Hyperlipidemia, GERD, Hypothyroidism, etc. She endorse mild intermittent CP especially when climbing upstairs though none currently. Otherwise, she has been feeling well and voices no other complaints today. She is complaint and taking her medications with no adverse effects reported. She is requesting refill of several medications today. She has modified her diet and she did not have labs done prior to today's visit.      Past Medical History:   Diagnosis Date   ??? Anemia    ??? Asthma    ??? Colon polyp    ??? DDD (degenerative disc disease) 02/15/2009   ??? Depression 12/18/2011   ??? Essential hypertension, benign    ??? History of echocardiogram 03/20/2004    EF 70%.  Borderline DDfx.  No significant valvular pathology.   ??? Hypercholesterolemia    ??? Hypertension    ??? Iron deficiency anemia 99991111   ??? 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 Lower extremity venous duplex 10/23/2009    Left leg:  No DVT.     ??? Meralgia paraesthetica 10/22/2013   ??? OA (osteoarthritis of spine) 02/15/2009   ??? OA (osteoarthritis of spine) 02/15/2009   ??? Obesity, unspecified    ??? Other and unspecified hyperlipidemia    ??? Pre-operative cardiovascular examination     For spine surgery   ??? Right sided sciatica    ??? Shortness of breath     Possible asthma, HCVD; less likely CAD (Noted 03/15/09)   ??? Sleep apnea 02/15/2009    uses cpap machine   ??? Thallium stress test  03/20/2004    Partially transient, mod basal & mid anterior defect most c/w artifact; mild anterior ischemia less likely.  Neg EKG on pharm stress test.   ??? Thyroid disease     hypothyroidism     Allergies   Allergen Reactions   ??? Asa-Acetaminophen-Caff-Potass Other (comments)     Bleeding in stomach   ??? Benadryl [Diphenhydramine Hcl] Other (comments)     Muscle jerking    ??? Other Plant, Animal, Environmental Not Reported This Time     Grass, Dust and Mold   ??? Pollen Extracts Not Reported This Time     Current Outpatient Prescriptions   Medication Sig Dispense Refill   ??? atorvastatin (LIPITOR) 40 mg tablet TAKE 1 TABLET BY MOUTH DAILY 30 Tab 3   ??? montelukast (SINGULAIR) 10 mg tablet take 1 tablet by mouth at bedtime if needed 90 Tab 1   ??? methocarbamol (ROBAXIN) 500 mg tablet Take 1 Tab by mouth three (3) times daily. 12 Tab 0   ??? valsartan-hydroCHLOROthiazide (DIOVAN-HCT) 160-25 mg per tablet take 1 tablet by mouth daily 30 Tab 3   ??? levothyroxine (SYNTHROID) 125 mcg tablet Take 1 Tab by mouth Daily (before breakfast). 30 Tab 3   ??? omeprazole (PRILOSEC) 40 mg capsule TAKE 1 CAPSULE BY MOUTH ONCE A DAY BEFORE BREAKFAST 30 Cap 5   ??? VARICELLA-ZOSTER VACINE LIVE 19,400 unit/0.65 mL susr injection   0   ??? gabapentin (NEURONTIN) 300 mg capsule take 1 capsule by mouth every morning and 2 capsules by mouth at bedtime 180 Cap 1   ??? budesonide-formoterol (SYMBICORT) 160-4.5 mcg/actuation HFA inhaler Take 2 Puffs by inhalation two (2) times a day. 1 Inhaler 5   ???  IRON DEXTRAN COMPLEX (IRON DEXTRAN IV) by IntraVENous route.     ??? Cholecalciferol, Vitamin D3, (VITAMIN D-3) 5,000 unit Tab Take  by mouth daily.            ROS:  Constitutional: Negative for fever, chills, or fatigue  Neurological: Negative for headache, dizziness, visual disturbance, or loss of consciousness  Respiratory: Negative for SOB, hemoptysis, or wheezing  Cardiovascular: Negative for chest pain, palpitation, or leg swelling  Gastrointestinal: Negative for abdominal pain, nausea, vomiting, diarrhea, blood in stool, melena, or heartburn  Musculoskeletal: Negative for falls          Physical Exam:  Visit Vitals   ??? BP 130/78 (BP 1 Location: Left arm, BP Patient Position: Sitting)   ??? Pulse 82   ??? Temp 98.8 ??F (37.1 ??C) (Oral)   ??? Resp 18   ??? Ht 5\' 4"  (1.626 m)   ??? Wt 181 lb 12.8 oz (82.5 kg)   ??? SpO2 98%    ??? BMI 31.21 kg/m2     General: Obese black female, a & o x 3, afebrile, interacting appropriately, in no acute distress  Skin: warm and dry, no rashes , no bruises  Neck: supple, symmetrical, no thyromegaly  Respiratory: symmetrical chest expansion, lung sounds clear bilaterally, good air entry, good respiratory effort, no wheezes or crackles  Cardiovascular: normal S1S2, regular rate and rhythm, no murmurs, pulses palpable, no thrill, no carotid or abdominal bruits, no JVD  Abdomen: non-distended, normoactive bowel sounds x 4 quadrants, soft, non-tender to palpation  Musculoskeletal: normal ROM on all joints, no swelling or deformity, no perilumbar tenderness, steady gait  Extremity: No edema noted        Assessment/Plan:    ICD-10-CM ICD-9-CM    1. HTN (hypertension), benign I10 401.1 Controlled  Continue current meds and she was counseled on low salt diet.   2. Pre-diabetes R73.03 790.29 Recent HBA1C is 5.8.  Counseled on diet and exercise.  HEMOGLOBIN A1C WITH EAG   3. Gastroesophageal reflux disease without esophagitis K21.9 530.81 omeprazole (PRILOSEC) 40 mg capsule   4. Hypercholesteremia E78.00 272.0 atorvastatin (LIPITOR) 40 mg tablet   5. 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 Obesity (BMI 30-39.9) E66.9 278.00 I have reviewed/discussed the above normal BMI with the patient.  I have recommended the following interventions: dietary management education, guidance, and counseling, encourage exercise and monitor weight . .     7. Chest pain, unspecified type R07.9 786.50 XR CHEST PA LAT      METABOLIC PANEL, COMPREHENSIVE      CBC WITH AUTOMATED DIFF      T4, FREE      TSH 3RD GENERATION      NT-PRO BNP         Orders Placed This Encounter   ??? XR CHEST PA LAT     Standing Status:   Future     Number of Occurrences:   1     Standing Expiration Date:   11/19/2017     Order Specific Question:   Reason for Exam     Answer:   Evaluate mild intermittent chest pain      Order Specific Question:   Is Patient Allergic to Contrast Dye?     Answer:   Unknown   ??? METABOLIC PANEL, COMPREHENSIVE     Standing Status:   Future     Number of Occurrences:   1     Standing Expiration Date:  10/21/2017   ??? CBC WITH AUTOMATED DIFF     Standing Status:   Future     Number of Occurrences:   1     Standing Expiration Date:   10/21/2017   ??? T4, FREE     Standing Status:   Future     Number of Occurrences:   1     Standing Expiration Date:   10/21/2017   ??? TSH 3RD GENERATION     Standing Status:   Future     Number of Occurrences:   1     Standing Expiration Date:   10/21/2017   ??? NT-PRO BNP     Standing Status:   Future     Number of Occurrences:   1     Standing Expiration Date:   10/21/2017   ??? HEMOGLOBIN A1C WITH EAG     Standing Status:   Future     Number of Occurrences:   1     Standing Expiration Date:   10/21/2017   ??? atorvastatin (LIPITOR) 40 mg tablet     Sig: TAKE 1 TABLET BY MOUTH DAILY     Dispense:  30 Tab     Refill:  3   ??? DISCONTD: methocarbamol (ROBAXIN) 500 mg tablet     Sig: Take 1 Tab by mouth three (3) times daily.     Dispense:  12 Tab     Refill:  0   ??? omeprazole (PRILOSEC) 40 mg capsule     Sig: TAKE 1 CAPSULE BY MOUTH ONCE A DAY BEFORE BREAKFAST     Dispense:  30 Cap     Refill:  5   ??? gabapentin (NEURONTIN) 300 mg capsule     Sig: take 1 capsule by mouth every morning and 2 capsules by mouth at bedtime     Dispense:  180 Cap     Refill:  1         Additional Notes: Discussed today's diagnosis, treatment plans. Discussed medication indications and side effects.    Weight Management:Counseled  After Visit Summary: Discussed provided printed patient instructions. Answered questions accordingly.  Follow-up Disposition: In 3 months with labs 1 week prior          Osie Cheeks, DO, MPH  Internal Medicine

## 2016-10-24 ENCOUNTER — Inpatient Hospital Stay: Admit: 2016-10-24 | Payer: PRIVATE HEALTH INSURANCE | Primary: Internal Medicine

## 2016-10-24 DIAGNOSIS — R079 Chest pain, unspecified: Secondary | ICD-10-CM

## 2016-10-24 LAB — METABOLIC PANEL, COMPREHENSIVE
A-G Ratio: 1.1 (ref 0.8–1.7)
ALT (SGPT): 28 U/L (ref 13–56)
AST (SGOT): 25 U/L (ref 15–37)
Albumin: 3.7 g/dL (ref 3.4–5.0)
Alk. phosphatase: 81 U/L (ref 45–117)
Anion gap: 5 mmol/L (ref 3.0–18)
BUN/Creatinine ratio: 21 — ABNORMAL HIGH (ref 12–20)
BUN: 16 MG/DL (ref 7.0–18)
Bilirubin, total: 0.5 MG/DL (ref 0.2–1.0)
CO2: 33 mmol/L — ABNORMAL HIGH (ref 21–32)
Calcium: 8.8 MG/DL (ref 8.5–10.1)
Chloride: 101 mmol/L (ref 100–108)
Creatinine: 0.78 MG/DL (ref 0.6–1.3)
GFR est AA: >60 mL/min/{1.73_m2} (ref >60)
GFR est non-AA: >60 mL/min/{1.73_m2} (ref >60)
Globulin: 3.4 g/dL (ref 2.0–4.0)
Glucose: 88 mg/dL (ref 74–99)
Potassium: 3.6 mmol/L (ref 3.5–5.5)
Protein, total: 7.1 g/dL (ref 6.4–8.2)
Sodium: 139 mmol/L (ref 136–145)

## 2016-10-24 LAB — TSH AND FREE T4
T4, Free: 0.9 NG/DL (ref 0.7–1.5)
TSH: 25.2 u[IU]/mL — ABNORMAL HIGH (ref 0.36–3.74)

## 2016-10-24 LAB — CBC WITH AUTOMATED DIFF
ABS. BASOPHILS: 0 10*3/uL (ref 0.0–0.1)
ABS. EOSINOPHILS: 0.1 10*3/uL (ref 0.0–0.4)
ABS. LYMPHOCYTES: 1.1 10*3/uL (ref 0.9–3.6)
ABS. MONOCYTES: 0.4 10*3/uL (ref 0.05–1.2)
ABS. NEUTROPHILS: 4.3 10*3/uL (ref 1.8–8.0)
BASOPHILS: 0 % (ref 0–2)
EOSINOPHILS: 2 % (ref 0–5)
HCT: 33.5 % — ABNORMAL LOW (ref 35.0–45.0)
HGB: 10.6 g/dL — ABNORMAL LOW (ref 12.0–16.0)
LYMPHOCYTES: 19 % — ABNORMAL LOW (ref 21–52)
MCH: 24.1 PG (ref 24.0–34.0)
MCHC: 31.6 g/dL (ref 31.0–37.0)
MCV: 76.3 FL (ref 74.0–97.0)
MONOCYTES: 7 % (ref 3–10)
MPV: 10.6 FL (ref 9.2–11.8)
NEUTROPHILS: 72 % (ref 40–73)
PLATELET: 286 10*3/uL (ref 135–420)
RBC: 4.39 M/uL (ref 4.20–5.30)
RDW: 15.7 % — ABNORMAL HIGH (ref 11.6–14.5)
WBC: 5.9 10*3/uL (ref 4.6–13.2)

## 2016-10-24 LAB — NT-PRO BNP: NT pro-BNP: 36 PG/ML (ref 0–900)

## 2016-10-24 LAB — HEMOGLOBIN A1C WITH EAG
Est. average glucose: 120 mg/dL
Hemoglobin A1c: 5.8 % — ABNORMAL HIGH (ref 4.2–5.6)

## 2016-10-27 NOTE — Telephone Encounter (Signed)
Patient called in and stated that the pharmacy stated that they never received the medications Lipitor and Prilosec. Please resend to pharmacy.

## 2016-10-28 ENCOUNTER — Encounter: Attending: Internal Medicine | Primary: Internal Medicine

## 2016-10-28 ENCOUNTER — Ambulatory Visit
Admit: 2016-10-28 | Discharge: 2016-10-28 | Payer: PRIVATE HEALTH INSURANCE | Attending: Internal Medicine | Primary: Internal Medicine

## 2016-10-28 DIAGNOSIS — R0609 Other forms of dyspnea: Secondary | ICD-10-CM

## 2016-10-28 MED ORDER — LEVOTHYROXINE 150 MCG TAB
150 mcg | ORAL_TABLET | Freq: Every day | ORAL | 1 refills | Status: DC
Start: 2016-10-28 — End: 2017-01-08

## 2016-10-28 NOTE — Patient Instructions (Signed)
Hypothyroidism: Care Instructions  Your Care Instructions    You have hypothyroidism, which means that your body is not making enough thyroid hormone. This hormone helps your body use energy. If your thyroid level is low, you may feel tired, be constipated, have an increase in your blood pressure, or have dry skin or memory problems. You may also get cold easily, even when it is warm. Women with low thyroid levels may have heavy menstrual periods.  A blood test to find your thyroid-stimulating hormone (TSH) level is used to check for hypothyroidism. A high TSH level may mean that you have low thyroid. When your body is not making enough thyroid hormone, TSH levels rise in an effort to make the body produce more.  The treatment for hypothyroidism is to take thyroid hormone pills. You should start to feel better in 1 to 2 weeks. But it can take several months to see changes in the TSH level. You will need regular visits with your doctor to make sure you have the right dose of medicine.  Most people need treatment for the rest of their lives. You will need to see your doctor regularly to have blood tests and to make sure you are doing well.  Follow-up care is a key part of your treatment and safety. Be sure to make and go to all appointments, and call your doctor if you are having problems. It's also a good idea to know your test results and keep a list of the medicines you take.  How can you care for yourself at home?  ?? Take your thyroid hormone medicine exactly as prescribed. Call your doctor if you think you are having a problem with your medicine. Most people do not have side effects if they take the right amount of medicine regularly.  ?? Take the medicine 30 minutes before breakfast, and do not take it with calcium, vitamins, or iron.  ?? Do not take extra doses of your thyroid medicine. It will not help you get better any faster, and it may cause side effects.   ?? If you forget to take a dose, do NOT take a double dose of medicine. Take your usual dose the next day.  ?? Tell your doctor about all prescription, herbal, or over-the-counter products you take.  ?? Take care of yourself. Eat a healthy diet, get enough sleep, and get regular exercise.  When should you call for help?  Call 911 anytime you think you may need emergency care. For example, call if:  ? ?? You passed out (lost consciousness).   ? ?? You have severe trouble breathing.   ? ?? You have a very slow heartbeat (less than 60 beats a minute).   ? ?? You have a low body temperature (95??F or below).   ?Call your doctor now or seek immediate medical care if:  ? ?? You feel tired, sluggish, or weak.   ? ?? You have trouble remembering things or concentrating.   ? ?? You do not begin to feel better 2 weeks after starting your medicine.   ?Watch closely for changes in your health, and be sure to contact your doctor if you have any problems.  Where can you learn more?  Go to http://www.healthwise.net/GoodHelpConnections.  Enter N862 in the search box to learn more about "Hypothyroidism: Care Instructions."  Current as of: Apr 17, 2016  Content Version: 11.4  ?? 2006-2017 Healthwise, Incorporated. Care instructions adapted under license by [employer] (which disclaims liability   or warranty for this information). If you have questions about a medical condition or this instruction, always ask your healthcare professional. [place] any warranty or liability for your use of this information.       Shortness of Breath: Care Instructions  Your Care Instructions  Shortness of breath has many causes. Sometimes conditions such as anxiety can lead to shortness of breath. Some people get mild shortness of breath when they exercise. Trouble breathing also can be a symptom of a serious problem, such as asthma, lung disease, emphysema, heart problems, and pneumonia.   If your shortness of breath continues, you may need tests and treatment. Watch for any changes in your breathing and other symptoms.  Follow-up care is a key part of your treatment and safety. Be sure to make and go to all appointments, and call your doctor if you are having problems. It's also a good idea to know your test results and keep a list of the medicines you take.  How can you care for yourself at home?  ?? Do not smoke or allow others to smoke around you. If you need help quitting, talk to your doctor about stop-smoking programs and medicines. These can increase your chances of quitting for good.  ?? Get plenty of rest and sleep.  ?? Take your medicines exactly as prescribed. Call your doctor if you think you are having a problem with your medicine.  ?? Find healthy ways to deal with stress.  ?? Exercise daily.  ?? Get plenty of sleep.  ?? Eat regularly and well.  When should you call for help?  Call 911 anytime you think you may need emergency care. For example, call if:  ? ?? You have severe shortness of breath.   ? ?? You have symptoms of a heart attack. These may include:  ?? Chest pain or pressure, or a strange feeling in the chest.  ?? Sweating.  ?? Shortness of breath.  ?? Nausea or vomiting.  ?? Pain, pressure, or a strange feeling in the back, neck, jaw, or upper belly or in one or both shoulders or arms.  ?? Lightheadedness or sudden weakness.  ?? A fast or irregular heartbeat.  After you call 911, the operator may tell you to chew 1 adult-strength or 2 to 4 low-dose aspirin. Wait for an ambulance. Do not try to drive yourself.   ?Call your doctor now or seek immediate medical care if:  ? ?? Your shortness of breath gets worse or you start to wheeze. Wheezing is a high-pitched sound when you breathe.   ? ?? You wake up at night out of breath or have to prop your head up on several pillows to breathe.   ? ?? You are short of breath after only light activity or while at rest.    ?Watch closely for changes in your health, and be sure to contact your doctor if:  ? ?? You do not get better over the next 1 to 2 days.   Where can you learn more?  Go to [email or web address].  Enter S780 in the search box to learn more about "Shortness of Breath: Care Instructions."  Current as of: Apr 17, 2016  Content Version: 11.4  ?? 2006-2017 Healthwise, Incorporated. Care instructions adapted under license by [employer] (which disclaims liability or warranty for this information). If you have questions about a medical condition or this instruction, always ask your healthcare professional. [place] any warranty or liability for  your use of this information.

## 2016-10-28 NOTE — Progress Notes (Signed)
Chief Complaint   Patient presents with   ??? Results     Xray         HPI:  Patient is a 65 year old African American female with medical problems listed below presents today to review chest imaging results ordered at her recent visit to evaluate dyspnea on exertion. Chest imaging done on 10/24/16 revealed no acute findings. She continues to have mild intermittent dyspnea on exertion with occasional chest pain, though her recent stress Echo done 07/03/16 at [facility] was negative for ischemia. Otherwise, she feels well and voices no other complaints today.        Past Medical History:   Diagnosis Date   ??? Anemia    ??? Asthma    ??? Colon polyp    ??? DDD (degenerative disc disease) 02/15/2009   ??? Depression 12/18/2011   ??? Essential hypertension, benign    ??? History of echocardiogram 03/20/2004    EF 70%.  Borderline DDfx.  No significant valvular pathology.   ??? Hypercholesterolemia    ??? Hypertension    ??? Iron deficiency anemia 99991111   ??? 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 Lower extremity venous duplex 10/23/2009    Left leg:  No DVT.     ??? Meralgia paraesthetica 10/22/2013   ??? OA (osteoarthritis of spine) 02/15/2009   ??? OA (osteoarthritis of spine) 02/15/2009   ??? Obesity, unspecified    ??? Other and unspecified hyperlipidemia    ??? Pre-operative cardiovascular examination     For spine surgery   ??? Right sided sciatica    ??? Shortness of breath     Possible asthma, HCVD; less likely CAD (Noted 03/15/09)   ??? Sleep apnea 02/15/2009    uses cpap machine   ??? Thallium stress test  03/20/2004    Partially transient, mod basal & mid anterior defect most c/w artifact; mild anterior ischemia less likely.  Neg EKG on pharm stress test.   ??? Thyroid disease     hypothyroidism     Allergies   Allergen Reactions   ??? Asa-Acetaminophen-Caff-[place]ass Other (comments)     Bleeding in stomach   ??? Benadryl [Diphenhydramine Hcl] Other (comments)     Muscle jerking   ??? Other Plant, Animal, Environmental Not Reported This Time      Grass, Dust and Mold   ??? Pollen Extracts Not Reported This Time     Current Outpatient Prescriptions   Medication Sig Dispense Refill   ??? atorvastatin (LIPITOR) 40 mg tablet TAKE 1 TABLET BY MOUTH DAILY 30 Tab 3   ??? methocarbamol (ROBAXIN) 500 mg tablet Take 1 Tab by mouth three (3) times daily. 12 Tab 0   ??? omeprazole (PRILOSEC) 40 mg capsule TAKE 1 CAPSULE BY MOUTH ONCE A DAY BEFORE BREAKFAST 30 Cap 5   ??? gabapentin (NEURONTIN) 300 mg capsule take 1 capsule by mouth every morning and 2 capsules by mouth at bedtime 180 Cap 1   ??? montelukast (SINGULAIR) 10 mg tablet take 1 tablet by mouth at bedtime if needed 90 Tab 1   ??? valsartan-hydroCHLOROthiazide (DIOVAN-HCT) 160-25 mg per tablet take 1 tablet by mouth daily 30 Tab 3   ??? levothyroxine (SYNTHROID) 125 mcg tablet Take 1 Tab by mouth Daily (before breakfast). 30 Tab 3   ??? VARICELLA-ZOSTER VACINE LIVE 19,400 unit/0.65 mL susr injection   0   ??? budesonide-formoterol (SYMBICORT) 160-4.5 mcg/actuation HFA inhaler Take 2 Puffs by inhalation two (2) times a day. 1 Inhaler 5   ???  IRON DEXTRAN COMPLEX (IRON DEXTRAN IV) by IntraVENous route.     ??? Cholecalciferol, Vitamin D3, (VITAMIN D-3) 5,000 unit Tab Take  by mouth daily.            ROS:  Pertinent as in HPI        Physical Exam:  Visit Vitals   ??? BP 140/70 (BP 1 Location: Left arm, BP Patient Position: Sitting)   ??? Pulse 70   ??? Temp 97.2 ??F (36.2 ??C) (Oral)   ??? Resp 18   ??? Ht 5\' 4"  (1.626 m)   ??? SpO2 97%     General: a & o x 3, afebrile, interacting appropriately, in no acute distress  Skin: warm and dry, no rashes , no bruises  Neck: supple, symmetrical, no thyromegaly  Respiratory: symmetrical chest expansion, lung sounds clear bilaterally, good air entry  Cardiovascular: normal S1S2, regular rate and rhythm, no murmurs, pulses palpable, no thrill, no carotid or abdominal bruits, no JVD  Abdomen: non-distended, normoactive bowel sounds x 4 quadrants, soft, non-tender to palpation   Musculoskeletal: normal ROM on all joints, no swelling or deformity, no perilumbar tenderness, steady gait          Assessment/Plan:    ICD-10-CM ICD-9-CM    1. DOE (dyspnea on exertion) R06.09 786.09 REFERRAL TO CARDIOLOGY   2. Chest pain, unspecified type R07.9 786.50 REFERRAL TO CARDIOLOGY   3. 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  Synthroid increased from 125 to 150 mcg daily today and will repeat TFT at f/u in 1 month.  T4, FREE      TSH 3RD GENERATION   4. Need for influenza vaccination Z23 V04.81 Flu shot given today   5. Encounter for immunization Z23 V03.89 INFLUENZA VIRUS VACCINE, HIGH DOSE SEASONAL, PRESERVATIVE FREE         Orders Placed This Encounter   ??? Influenza virus vaccine (FLUZONE HIGH-DOSE) 60 years and older   ??? T4, FREE     Standing Status:   Future     Standing Expiration Date:   04/26/2017   ??? TSH 3RD GENERATION     Standing Status:   Future     Standing Expiration Date:   02/25/2017   ??? [facility] [address] [facility]     Referral Priority:   Routine     Referral Type:   Consultation     Referral Reason:   Specialty Services Required     Referred to Provider:   Fredirick Maudlin, MD   ??? levothyroxine (SYNTHROID) 150 mcg tablet     Sig: Take 1 Tab by mouth Daily (before breakfast).     Dispense:  30 Tab     Refill:  1       Recent labs reviewed with pt today.      Recent chest imaging was reviewed with pt today      Additional Notes: Discussed today's diagnosis, treatment plans. Discussed medication indications and side effects.    Weight Management:Counseled  After Visit Summary: Discussed provided printed patient instructions. Answered questions accordingly.  Follow-up Disposition: In 4 weeks with labs 1 week prior          Osie Cheeks, DO, MPH  Internal Medicine

## 2016-10-28 NOTE — Progress Notes (Signed)
Chief Complaint   Patient presents with   ??? Results     Xray   Patient is here today for Xray results.  1. Have you been to the ER, urgent care clinic since your last visit?  Hospitalized since your last visit?No    2. Have you seen or consulted any other health care providers outside of the [place] since your last visit?  Include any pap smears or colon screening. No

## 2016-10-28 NOTE — Telephone Encounter (Signed)
Pt was seen in the Office today for mediation refills.

## 2016-11-23 ENCOUNTER — Encounter (HOSPITAL_COMMUNITY): Payer: Self-pay | Admitting: Emergency Medicine

## 2016-11-23 ENCOUNTER — Emergency Department (HOSPITAL_COMMUNITY)
Admission: EM | Admit: 2016-11-23 | Discharge: 2016-11-23 | Disposition: A | Discharge status: Home / Self Care | Payer: Medicare Other | Attending: Emergency Medicine | Admitting: Emergency Medicine

## 2016-11-23 DIAGNOSIS — I10 Essential (primary) hypertension: Secondary | ICD-10-CM | POA: Diagnosis not present

## 2016-11-23 DIAGNOSIS — K219 Gastro-esophageal reflux disease without esophagitis: Secondary | ICD-10-CM | POA: Diagnosis not present

## 2016-11-23 DIAGNOSIS — R1013 Epigastric pain: Secondary | ICD-10-CM | POA: Diagnosis present

## 2016-11-23 DIAGNOSIS — Z79899 Other long term (current) drug therapy: Secondary | ICD-10-CM | POA: Insufficient documentation

## 2016-11-23 DIAGNOSIS — Z7982 Long term (current) use of aspirin: Secondary | ICD-10-CM | POA: Diagnosis not present

## 2016-11-23 LAB — COMPREHENSIVE METABOLIC PANEL
ALK PHOS: 59 U/L (ref 38–126)
ALT: 18 U/L (ref 14–54)
AST: 23 U/L (ref 15–41)
Albumin: 4.4 g/dL (ref 3.5–5.0)
Anion gap: 10 (ref 5–15)
BILIRUBIN TOTAL: 0.5 mg/dL (ref 0.3–1.2)
BUN: 16 mg/dL (ref 6–20)
CALCIUM: 9.9 mg/dL (ref 8.9–10.3)
CO2: 27 mmol/L (ref 22–32)
CREATININE: 1.02 mg/dL — AB (ref 0.44–1.00)
Chloride: 101 mmol/L (ref 101–111)
GFR, EST NON AFRICAN AMERICAN: 56 mL/min — AB (ref >60)
Glucose, Bld: 142 mg/dL — ABNORMAL HIGH (ref 65–99)
Potassium: 3.9 mmol/L (ref 3.5–5.1)
Sodium: 138 mmol/L (ref 135–145)
Total Protein: 7.6 g/dL (ref 6.5–8.1)

## 2016-11-23 LAB — CBC WITH DIFFERENTIAL/PLATELET
Basophils Absolute: 0 10*3/uL (ref 0.0–0.1)
Basophils Relative: 0 %
EOS PCT: 1 %
Eosinophils Absolute: 0.1 10*3/uL (ref 0.0–0.7)
HEMATOCRIT: 36.7 % (ref 36.0–46.0)
HEMOGLOBIN: 12.3 g/dL (ref 12.0–15.0)
LYMPHS ABS: 2 10*3/uL (ref 0.7–4.0)
LYMPHS PCT: 18 %
MCH: 30.3 pg (ref 26.0–34.0)
MCHC: 33.5 g/dL (ref 30.0–36.0)
MCV: 90.4 fL (ref 78.0–100.0)
Monocytes Absolute: 0.5 10*3/uL (ref 0.1–1.0)
Monocytes Relative: 4 %
NEUTROS ABS: 8.4 10*3/uL — AB (ref 1.7–7.7)
NEUTROS PCT: 77 %
Platelets: 301 10*3/uL (ref 150–400)
RBC: 4.06 MIL/uL (ref 3.87–5.11)
RDW: 14.1 % (ref 11.5–15.5)
WBC: 11 10*3/uL — AB (ref 4.0–10.5)

## 2016-11-23 LAB — LIPASE, BLOOD: LIPASE: 19 U/L (ref 11–51)

## 2016-11-23 MED ORDER — ONDANSETRON 4 MG PO TBDP
8.0000 mg | ORAL_TABLET | Freq: Once | ORAL | Status: AC
  Administered 2016-11-23: 8 mg via ORAL
  Filled 2016-11-23: qty 2

## 2016-11-23 MED ORDER — PANTOPRAZOLE SODIUM 40 MG PO TBEC
80.0000 mg | DELAYED_RELEASE_TABLET | Freq: Once | ORAL | Status: AC
  Administered 2016-11-23: 80 mg via ORAL
  Filled 2016-11-23: qty 2

## 2016-11-23 MED ORDER — SUCRALFATE 1 GM/10ML PO SUSP
1.0000 g | Freq: Three times a day (TID) | ORAL | 0 refills | Status: AC
Start: 2016-11-23 — End: ?

## 2016-11-23 MED ORDER — GI COCKTAIL ~~LOC~~
30.0000 mL | Freq: Once | ORAL | Status: AC
  Administered 2016-11-23: 30 mL via ORAL
  Filled 2016-11-23: qty 30

## 2016-11-23 MED ORDER — ONDANSETRON 4 MG PO TBDP: 4.0000 mg | ORAL_TABLET | Freq: Three times a day (TID) | ORAL | 0 refills | Status: AC | PRN

## 2016-11-23 MED ORDER — ONDANSETRON HCL 4 MG/2ML IJ SOLN: 4.0000 mg | Freq: Once | INTRAMUSCULAR | Status: DC

## 2016-11-23 NOTE — Discharge Instructions (Signed)
Please take your omeprazole every day.  You may take 40 mg once a day.

## 2016-11-23 NOTE — ED Provider Notes (Signed)
TIME SEEN: 4:40 AM  CHIEF COMPLAINT: Acid reflux  HPI: Pt is a 65 y.o. female with history of hypertension, hyperlipidemia, acid reflux who presents emergency department with epigastric pain that feels like her acid reflux. Describes it as a burning pain that goes up into her throat. She gets a better answer to see her mouth and had several episodes of nonbloody, nonbilious vomiting. No diarrhea, bloody stools or melena. No alcohol use or heavy NSAID use. Has been taking omeprazole occasionally but did not take any today. No fever. No chest pain or shortness of breath.  ROS: See HPI Constitutional: no fever  Eyes: no drainage  ENT: no runny nose   Cardiovascular:  no chest pain  Resp: no SOB  GI:  vomiting GU: no dysuria Integumentary: no rash  Allergy: no hives  Musculoskeletal: no leg swelling  Neurological: no slurred speech ROS otherwise negative  PAST MEDICAL HISTORY/PAST SURGICAL HISTORY:  Past Medical History:  Diagnosis Date  . GERD (gastroesophageal reflux disease)   . Gout   . Hypercholesteremia   . Hypertension     MEDICATIONS:  Prior to Admission medications   Medication Sig Start Date End Date Taking? Authorizing Provider  allopurinol (ZYLOPRIM) 100 MG tablet Take 100 mg by mouth daily.    Historical Provider, MD  aspirin 81 MG tablet Take 81 mg by mouth daily.    Historical Provider, MD  Cholecalciferol (VITAMIN D PO) Take 1 tablet by mouth daily.    Historical Provider, MD  lisinopril (PRINIVIL,ZESTRIL) 20 MG tablet Take 20 mg by mouth daily.    Historical Provider, MD  omeprazole (PRILOSEC) 20 MG capsule Take 20 mg by mouth daily as needed (for stomach acid).     Historical Provider, MD  simvastatin (ZOCOR) 10 MG tablet Take 10 mg by mouth daily. 02/14/16   Historical Provider, MD    ALLERGIES:  Allergies  Allergen Reactions  . Morphine And Related Anaphylaxis and Other (See Comments)    Dry throat,couldn't swallow    SOCIAL HISTORY:  Social History   Substance Use Topics  . Smoking status: Never Smoker  . Smokeless tobacco: Never Used  . Alcohol use No    FAMILY HISTORY: Family History  Problem Relation Age of Onset  . Heart disease Mother   . Diabetes Mother   . Lung cancer Father   . Diabetes Brother     EXAM: BP 168/90 (BP Location: Right Arm)   Pulse 86   Temp 98 F (36.7 C) (Oral)   Resp 24   SpO2 100%  CONSTITUTIONAL: Alert and oriented and responds appropriately to questions. Well-appearing; well-nourished HEAD: Normocephalic EYES: Conjunctivae clear, PERRL, EOMI ENT: normal nose; no rhinorrhea; moist mucous membranes NECK: Supple, no meningismus, no nuchal rigidity, no LAD  CARD: RRR; S1 and S2 appreciated; no murmurs, no clicks, no rubs, no gallops RESP: Normal chest excursion without splinting or tachypnea; breath sounds clear and equal bilaterally; no wheezes, no rhonchi, no rales, no hypoxia or respiratory distress, speaking full sentences ABD/GI: Normal bowel sounds; non-distended; soft, minimally tender in epigastric region, negative Murphy sign, no tenderness at McBurney's point, no rebound, no guarding, no peritoneal signs, no hepatosplenomegaly BACK:  The back appears normal and is non-tender to palpation, there is no CVA tenderness EXT: Normal ROM in all joints; non-tender to palpation; no edema; normal capillary refill; no cyanosis, no calf tenderness or swelling    SKIN: Normal color for age and race; warm; no rash NEURO: Moves all extremities equally, sensation  to light touch intact diffusely, cranial nerves II through XII intact, normal speech PSYCH: The patient's mood and manner are appropriate. Grooming and personal hygiene are appropriate.  MEDICAL DECISION MAKING: Patient here with gastritis, GERD. Will treat with GI cocktail, Protonix, Zofran. EKG shows no new ischemic abnormality. She denies chest pain or shortness of breath. Abdominal exam benign. Doubt pancreatitis, cholecystitis or  cholelithiasis, colitis or diverticulitis, appendicitis. We'll obtain labs and reassess.  ED PROGRESS: Labs unremarkable other than mild leukocytosis with left shift. She denies any fever. Abdominal exam still benign. LFTs, lipase normal. She reports feeling much better after Protonix and Zofran. She refuses GI cocktail. I feel she is safe to be discharged home. Able to tolerate liquids. Discussed bland diet for the next several days. Recommended she start omeprazole 40 mg daily. We'll discharge with Carafate. We'll discharge with GI follow-up if symptoms worsen. She has a PCP. Discussed return precautions. She is comfortable with this plan.   At this time, I do not feel there is any life-threatening condition present. I have reviewed and discussed all results (EKG, imaging, lab, urine as appropriate) and exam findings with patient/family. I have reviewed nursing notes and appropriate previous records.  I feel the patient is safe to be discharged home without further emergent workup and can continue workup as an outpatient as needed. Discussed usual and customary return precautions. Patient/family verbalize understanding and are comfortable with this plan.  Outpatient follow-up has been provided. All questions have been answered.       EKG Interpretation  Date/Time:  Monday November 23 2016 04:50:25 EST Ventricular Rate:  83 PR Interval:    QRS Duration: 90 QT Interval:  380 QTC Calculation: 447 R Axis:   52 Text Interpretation:  Sinus rhythm Abnormal R-wave progression, early transition No significant change since last tracing Confirmed by Thereasa Iannello,  DO, Natoya Viscomi [phone]([id number] on 11/23/2016 6:21:59 AM         Layla MawKristen N Adeliz Tonkinson, DO 11/23/16 [id number]

## 2016-11-23 NOTE — ED Notes (Signed)
RN at bedside

## 2016-11-23 NOTE — ED Triage Notes (Signed)
C/o epigastric pain that started last night.  Hx of gerd.

## 2016-11-25 ENCOUNTER — Encounter: Attending: Specialist | Primary: Internal Medicine

## 2016-11-26 ENCOUNTER — Ambulatory Visit
Admit: 2016-11-26 | Discharge: 2016-11-26 | Payer: PRIVATE HEALTH INSURANCE | Attending: Specialist | Primary: Internal Medicine

## 2016-11-26 DIAGNOSIS — R0602 Shortness of breath: Secondary | ICD-10-CM

## 2016-11-26 NOTE — Patient Instructions (Addendum)
Medications Discontinued During This Encounter   Medication Reason   ??? IRON DEXTRAN COMPLEX (IRON DEXTRAN IV) Therapy Completed   ??? methocarbamol (ROBAXIN) 500 mg tablet Therapy Completed       Orders Placed This Encounter   ??? SCHEDULE NUCLEAR STUDY     exercise   ??? 2D ECHO COMPLETE ADULT (TTE)     Standing Status:   Future     Standing Expiration Date:   05/25/2017     Order Specific Question:   Reason for Exam:     Answer:   sob          Shortness of Breath: Care Instructions  Your Care Instructions  Shortness of breath has many causes. Sometimes conditions such as anxiety can lead to shortness of breath. Some people get mild shortness of breath when they exercise. Trouble breathing also can be a symptom of a serious problem, such as asthma, lung disease, emphysema, heart problems, and pneumonia.  If your shortness of breath continues, you may need tests and treatment. Watch for any changes in your breathing and other symptoms.  Follow-up care is a key part of your treatment and safety. Be sure to make and go to all appointments, and call your doctor if you are having problems. It's also a good idea to know your test results and keep a list of the medicines you take.  How can you care for yourself at home?  ?? Do not smoke or allow others to smoke around you. If you need help quitting, talk to your doctor about stop-smoking programs and medicines. These can increase your chances of quitting for good.  ?? Get plenty of rest and sleep.  ?? Take your medicines exactly as prescribed. Call your doctor if you think you are having a problem with your medicine.  ?? Find healthy ways to deal with stress.  ?? Exercise daily.  ?? Get plenty of sleep.  ?? Eat regularly and well.  When should you call for help?  Call 911 anytime you think you may need emergency care. For example, call if:  ? ?? You have severe shortness of breath.   ? ?? You have symptoms of a heart attack. These may include:   ?? Chest pain or pressure, or a strange feeling in the chest.  ?? Sweating.  ?? Shortness of breath.  ?? Nausea or vomiting.  ?? Pain, pressure, or a strange feeling in the back, neck, jaw, or upper belly or in one or both shoulders or arms.  ?? Lightheadedness or sudden weakness.  ?? A fast or irregular heartbeat.  After you call 911, the operator may tell you to chew 1 adult-strength or 2 to 4 low-dose aspirin. Wait for an ambulance. Do not try to drive yourself.   ?Call your doctor now or seek immediate medical care if:  ? ?? Your shortness of breath gets worse or you start to wheeze. Wheezing is a high-pitched sound when you breathe.   ? ?? You wake up at night out of breath or have to prop your head up on several pillows to breathe.   ? ?? You are short of breath after only light activity or while at rest.   ?Watch closely for changes in your health, and be sure to contact your doctor if:  ? ?? You do not get better over the next 1 to 2 days.   Where can you learn more?  Go to [email or web address].at.  Enter S780 in the search box  to learn more about "Shortness of Breath: Care Instructions."  Current as of: Apr 17, 2016  Content Version: 11.4  ?? 2006-2017 Healthwise, Incorporated. Care instructions adapted under license by [employer] (which disclaims liability or warranty for this information). If you have questions about a medical condition or this instruction, always ask your healthcare professional. [place] any warranty or liability for your use of this information.

## 2016-11-26 NOTE — Progress Notes (Signed)
New patient     Med list provided     Patient states she is having SOB with exertion

## 2016-11-26 NOTE — Progress Notes (Signed)
HISTORY OF PRESENT ILLNESS  Elaine [name] is a 65 y.o. female.    New Patient   The history is provided by the patient. Associated symptoms include shortness of breath. Pertinent negatives include no chest pain and no headaches.   Shortness of Breath   The history is provided by the patient. This is a new problem. The problem occurs intermittently.The current episode started more than 1 week ago (9/17). Pertinent negatives include no fever, no headaches, no cough, no wheezing, no PND, no orthopnea, no chest pain, no vomiting, no rash, no leg swelling and no claudication. The problem's precipitants include exercise (steps).       Review of Systems   Constitutional: Negative for chills, fever, malaise/fatigue and weight loss.   HENT: Negative for nosebleeds.    Eyes: Negative for discharge.   Respiratory: Positive for shortness of breath. Negative for cough and wheezing.    Cardiovascular: Negative for chest pain, palpitations, orthopnea, claudication, leg swelling and PND.   Gastrointestinal: Negative for diarrhea, nausea and vomiting.   Genitourinary: Negative for dysuria and hematuria.   Musculoskeletal: Negative for joint pain.   Skin: Negative for rash.   Neurological: Negative for dizziness, seizures, loss of consciousness and headaches.   Endo/Heme/Allergies: Negative for polydipsia. Does not bruise/bleed easily.   Psychiatric/Behavioral: Negative for depression and substance abuse. The patient does not have insomnia.      Allergies   Allergen Reactions   ??? Asa-Acetaminophen-Caff-Potass Other (comments)     Bleeding in stomach   ??? Benadryl [Diphenhydramine Hcl] Other (comments)     Muscle jerking   ??? Other Plant, Animal, Environmental Not Reported This Time     Grass, Dust and Mold   ??? Pollen Extracts Not Reported This Time       Past Medical History:   Diagnosis Date   ??? Anemia    ??? Asthma    ??? Colon polyp    ??? DDD (degenerative disc disease) 02/15/2009   ??? Depression 12/18/2011    ??? Essential hypertension, benign    ??? History of echocardiogram 03/20/2004    EF 70%.  Borderline DDfx.  No significant valvular pathology.   ??? Hypercholesterolemia    ??? Hypertension    ??? Iron deficiency anemia 99991111   ??? 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 Lower extremity venous duplex 10/23/2009    Left leg:  No DVT.     ??? Meralgia paraesthetica 10/22/2013   ??? OA (osteoarthritis of spine) 02/15/2009   ??? OA (osteoarthritis of spine) 02/15/2009   ??? Obesity, unspecified    ??? Other and unspecified hyperlipidemia    ??? Pre-operative cardiovascular examination     For spine surgery   ??? Right sided sciatica    ??? Shortness of breath     Possible asthma, HCVD; less likely CAD (Noted 03/15/09)   ??? Sleep apnea 02/15/2009    uses cpap machine   ??? Thallium stress test  03/20/2004    Partially transient, mod basal & mid anterior defect most c/w artifact; mild anterior ischemia less likely.  Neg EKG on pharm stress test.   ??? Thyroid disease     hypothyroidism       Family History   Problem Relation Age of Onset   ??? Cancer Mother    ??? Hypertension Mother    ??? Hypertension Sister      x3   ??? Heart Surgery Sister    ??? Heart Disease Sister    ??? Heart Attack Sister    ???  Cancer Maternal Aunt      breast   ??? Breast Cancer Maternal Aunt    ??? Glaucoma Son    ??? Heart defect Son    ??? Diabetes Paternal Uncle    ??? Hypertension Paternal Uncle        Social History   Substance Use Topics   ??? Smoking status: Former Smoker     Years: 28.00     Quit date: 04/25/2004   ??? Smokeless tobacco: Never Used   ??? Alcohol use No        Current Outpatient Prescriptions   Medication Sig   ??? levothyroxine (SYNTHROID) 150 mcg tablet Take 1 Tab by mouth Daily (before breakfast).   ??? atorvastatin (LIPITOR) 40 mg tablet TAKE 1 TABLET BY MOUTH DAILY   ??? omeprazole (PRILOSEC) 40 mg capsule TAKE 1 CAPSULE BY MOUTH ONCE A DAY BEFORE BREAKFAST   ??? gabapentin (NEURONTIN) 300 mg capsule take 1 capsule by mouth every morning and 2 capsules by mouth at bedtime    ??? montelukast (SINGULAIR) 10 mg tablet take 1 tablet by mouth at bedtime if needed   ??? valsartan-hydroCHLOROthiazide (DIOVAN-HCT) 160-25 mg per tablet take 1 tablet by mouth daily   ??? budesonide-formoterol (SYMBICORT) 160-4.5 mcg/actuation HFA inhaler Take 2 Puffs by inhalation two (2) times a day.   ??? VARICELLA-ZOSTER VACINE LIVE 19,400 unit/0.65 mL susr injection    ??? Cholecalciferol, Vitamin D3, (VITAMIN D-3) 5,000 unit Tab Take  by mouth daily.     No current facility-administered medications for this visit.         Past Surgical History:   Procedure Laterality Date   ??? HX COLONOSCOPY  02-24-12    normal (Dr. Angelique Holm)   ??? HX HERNIA REPAIR      3x   ??? HX HYSTERECTOMY     ??? HX TUBAL LIGATION     ??? NEUROLOGICAL PROCEDURE UNLISTED  03-16-2009    s/p ACD & Fusion (Dr.P.Gurtner)   ??? PR COLONOSCOPY FLX DX W/COLLJ SPEC WHEN PFRMD  02-2007    +polyp(tubular adenoma); Dr Angelique Holm       Visit Vitals   ??? BP 118/71 (BP 1 Location: Left arm, BP Patient Position: Sitting)   ??? Pulse 84   ??? Ht 5\' 2"  (1.575 m)   ??? Wt 200 lb (90.7 kg)   ??? BMI 36.58 kg/m2       Diagnostic Studies:  I have reviewed the relevant tests done on the patient and show as follows  EKG tracings reviewed by me today.    No flowsheet data found.    Elaine [name] has a reminder for a "due or due soon" health maintenance. I have asked that she contact her primary care provider for follow-up on this health maintenance.    Physical Exam   Constitutional: She is oriented to person, place, and time. She appears well-developed and well-nourished. No distress.   sev obese   HENT:   Head: Normocephalic and atraumatic.   Mouth/Throat: Normal dentition.   Eyes: Right eye exhibits no discharge. Left eye exhibits no discharge. No scleral icterus.   Neck: Neck supple. No JVD present. Carotid bruit is not present. No thyromegaly present.   Cardiovascular: Normal rate, regular rhythm, S1 normal, S2 normal, normal heart sounds and intact distal pulses.  Exam reveals no gallop and no  friction rub.    No murmur heard.  Pulmonary/Chest: Effort normal and breath sounds normal. She has no wheezes. She has no rales.  Abdominal: Soft. She exhibits no mass. There is no tenderness.   Musculoskeletal: She exhibits no edema.   Lymphadenopathy:        Right cervical: No superficial cervical adenopathy present.       Left cervical: No superficial cervical adenopathy present.   Neurological: She is alert and oriented to person, place, and time.   Skin: Skin is warm and dry. No rash noted.   Psychiatric: She has a normal mood and affect. Her behavior is normal.       ASSESSMENT and PLAN      Diagnoses and all orders for this visit:    1. SOB (shortness of breath) on exertion  -     2D ECHO COMPLETE ADULT (TTE); Future  -     SCHEDULE NUCLEAR STUDY    2. HTN (hypertension), benign  Comments:  controlled      3. Hypercholesteremia  Comments:  get from PCP    4. Iron deficiency anemia, unspecified iron deficiency anemia type  Comments:  F/u PCP, get IV iron off & on        Pertinent laboratory and test data reviewed and discussed with patient.  See patient instructions also for other medical advice given    Medications Discontinued During This Encounter   Medication Reason   ??? IRON DEXTRAN COMPLEX (IRON DEXTRAN IV) Therapy Completed   ??? methocarbamol (ROBAXIN) 500 mg tablet Therapy Completed       Follow-up Disposition:  Return in about 6 weeks (around 01/07/2017), or if symptoms worsen or fail to improve, for post test.

## 2016-12-11 ENCOUNTER — Encounter: Primary: Internal Medicine

## 2016-12-16 ENCOUNTER — Telehealth

## 2016-12-16 ENCOUNTER — Encounter: Primary: Internal Medicine

## 2016-12-16 ENCOUNTER — Institutional Professional Consult (permissible substitution): Admit: 2016-12-16 | Payer: PRIVATE HEALTH INSURANCE | Primary: Internal Medicine

## 2016-12-16 ENCOUNTER — Institutional Professional Consult (permissible substitution): Admit: 2016-12-16 | Discharge: 2016-12-16 | Payer: PRIVATE HEALTH INSURANCE | Primary: Internal Medicine

## 2016-12-16 ENCOUNTER — Institutional Professional Consult (permissible substitution): Primary: Internal Medicine

## 2016-12-16 DIAGNOSIS — R0602 Shortness of breath: Secondary | ICD-10-CM

## 2016-12-16 LAB — EJECTION FRACTION PERCENTAGE, EXTERNAL: Left Ventricular Ejection Fraction, External: 70

## 2016-12-16 MED ORDER — REGADENOSON 0.4 MG/5 ML IV SYRINGE
0.4 mg/5 mL | Freq: Once | INTRAVENOUS | 0 refills | Status: AC
Start: 2016-12-16 — End: 2016-12-16

## 2016-12-16 NOTE — Progress Notes (Signed)
[facility]   [address] [place]  [address]  [place], [place]  [address]  [phone] [place]  [phone] [place]       Name: Elaine [name]         MRN#: [id number]        Date of Birth: October 17, 1951   Gender: female Ht:5'2" Wt:200 lbs       .  Date of Rest/Stress Images: 12/16/2016   Referring Physician: Osie Cheeks, DO  Ordering Physician: Louis Matte. Lyndel Safe, MD, [facility]  Technologist: Beola Cord.Griffis, B.S., C.N.M.T  Diagnosis:  1. Shortness of breath    2. Essential hypertension, malignant    3. Pure hypercholesterolemia          Rest/Stress Myoview SPECT Myocardial Perfusion Imaging with  Lexiscan Stress and gated SPECT Imaging      PROCEDURE:      Myocardial perfusion imaging was performed at rest approximately 30 mins following the intravenous injection,(Right hand ) of 12.1 mCi of Tc86m Myoview for evaluation of myocardial function and perfusion at rest.    Baseline Data:    Baseline EKG reveals sinus rhythm, within normal limits.  Baseline heart rate is 83.  Baseline blood pressure is 110/56.      Procedure:    The patient was injected with 0.4 mg IV Lexiscan.  The patient had no significant symptoms.  Heart rate increased from baseline to a heart rate of 102.  Blood pressure was without significant change at 110/56.  Electrocardiogram showed no significant ST-T changes or arrhythmia during the procedure.      Diagnosis:   1. Negative EKG portion of Lexiscan stress test.    2. Nuclear imaging report to follow.          Pharmacological:  Patient was injected with .4 mg/mL with Lexiscan intravenously over a period 10 to 20 sec. After pharmacologic stress, the patient was injected intravenously with 35.4 mCi of Tc71m Myoview. Gating post stress tomographic imaging performed approximately 45 minutes post tracer injection. The data was reconstructed in the short, horizontal long and  vertical long axis views and tomographic slices were generated.     NUCLEAR IMAGING:    Findings:  1. Post-stress imaging in short axis, horizontal and vertical long axis views reveals a small area of mild defect in the inferobasal wall.    2. Resting images also show persistent fixed defect in the inferobasal wall.    3. Gated images show normal left ventricular size, wall motion and systolic function.  The ejection fraction was 75%.     Diagnosis:   1. Probably normal scan.    2. Evidence of a small mild fixed inferior wall defect noted from his nuclear study suggestive of tissue attenuation with normal wall motion in the area.    3. No reversible defect suggesting ischemia noted from his nuclear study.   4. Normal left ventricular size and systolic function.   5. Low risk scan.                Thank you for the referral.    E-signed and Interpreting Physician:    Lindajo Royal. Posey Pronto, MD, [facility]     Date of interpretation: 12/16/2016  Date of final report: 12/16/2016

## 2016-12-16 NOTE — Telephone Encounter (Signed)
I called Pt in regards to Referral for Arthritis.  Pt sts that she is ok to go to [facility].  Informed Pt that I would sent request through to the Provider.  Pt verbalized understanding.    Dr Doreatha Martin Please advise referral has been pended

## 2016-12-16 NOTE — Telephone Encounter (Signed)
Pt request referral to PT for arthritis in her lower back. She said Dr Doreatha Martin is aware and offered her PT in the past but she declined due to her ins coverage.    She said she has new ins now and would like to be referred

## 2016-12-16 NOTE — Telephone Encounter (Signed)
I have signed the physical therapy evaluation. Please notify pt.

## 2016-12-30 DIAGNOSIS — E785 Hyperlipidemia, unspecified: Secondary | ICD-10-CM | POA: Diagnosis not present

## 2016-12-30 DIAGNOSIS — M109 Gout, unspecified: Secondary | ICD-10-CM | POA: Diagnosis not present

## 2016-12-30 DIAGNOSIS — K219 Gastro-esophageal reflux disease without esophagitis: Secondary | ICD-10-CM | POA: Diagnosis not present

## 2016-12-30 DIAGNOSIS — E119 Type 2 diabetes mellitus without complications: Secondary | ICD-10-CM | POA: Diagnosis not present

## 2016-12-30 DIAGNOSIS — I10 Essential (primary) hypertension: Secondary | ICD-10-CM | POA: Diagnosis not present

## 2017-01-01 ENCOUNTER — Inpatient Hospital Stay: Admit: 2017-01-01 | Payer: PRIVATE HEALTH INSURANCE | Primary: Internal Medicine

## 2017-01-01 DIAGNOSIS — M545 Low back pain: Secondary | ICD-10-CM

## 2017-01-01 NOTE — Progress Notes (Signed)
[facility] ??? 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  Assessment/ key information: Patient is a 66 y.o. female referred to PT with the above Dx.  Patient seen today for c/o low back pain from OA with increased pain recently.  Pain stays in the low back and affects her posture at the end of the work day, causing a forward flexed posture.  Pain progressed from acting up maybe once a year with receiving injections every 4-6 months to a pain on a regular basis.  Does not hurt all day every day. 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 Patient presents to PT with an impaired gait, decreased balance, decreased strength, decreased flexibility, and decreased mobility.  Patient has a pelvic obliquity and a C-Shaped curve of the Tx/Lx spine  Patient s/s appear to be consistent w/ diagnosis.  Patient demonstrates the potential to make functional gains within a reasonable time frame.  Patient will benefit from skilled PT to address impairments and improve functional mobility, strength, gait and balance for an improved quality of life.  Fall Risk Assessment: Patient demonstrates a Medium Low Fall Risk due to  Chiari Malformation Evaluation Complexity History LOW Complexity : Zero comorbidities / personal factors that will impact the outcome / POC; Examination MEDIUM Complexity : 3 Standardized tests and measures addressing body structure, function, activity limitation and / or participation in recreation  ;Presentation LOW Complexity : Stable, uncomplicated  ;Clinical Decision Making MEDIUM Complexity : FOTO score of 26-74  Overall Complexity Rating: LOW   Problem List: pain affecting function, decrease ROM, decrease strength, impaired gait/ balance, decrease ADL/ functional abilitiies, decrease activity tolerance, decrease flexibility/ joint mobility, decrease transfer abilities and other FOTO = 47   Treatment Plan may include any combination of the following: Therapeutic exercise, Therapeutic activities, Neuromuscular re-education, Physical agent/modality, Gait/balance training, Manual therapy, Patient education, Self Care training and Functional mobility training  Patient / Family readiness to learn indicated by: asking questions, trying to perform skills and interest  Persons(s) to be included in education: patient (P)  Barriers to Learning/Limitations: None  Patient Goal (s): ???Find out something I can do so it doesn't hurt as bad.  Try and knock out some of the pain???  Patient Self Reported Health Status: good  Rehabilitation Potential: good    Short Term Goals: To be accomplished in 5 treatments:  1.  Pt will be compliant and independent with HEP in order to facilitate PT sessions and aid with self management  Eval Status:  Initiated                        Current Status:  2.  Pt to tolerate 30 min or more of TE and/or Interventions w/o increased s/s                        Eval Status:  Initiated                        Current Status:  Long Term Goals: To be accomplished in 10 treatments:  1.  Pt will report 50% improvement or better with involvement to show a significant increase in function                         Eval Status:  Initiated                        Current Status:  2.  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  Pt will have improved upright posturing to -10* or better to show improved postural alignment and increased core stability to aid with walking tolerance                           Eval Status:  Initiated                        Current Status:  4.  Pt will negotiate 1 flight of steps x 2 Reps to transition up and down her stairs at home                        Eval Status:  Difficulty with negotiating stairs                        Current Status:  5.  Pt will improve FOTO score to 56 in 12 visits to show significant improvement in function for progress to little difficulty with usual daily work tasks                        Eval Status: 47                        Current Status:  Frequency / Duration: Patient to be seen 2-3 times per week for 10 treatments.    Patient/ Caregiver education and instruction: Diagnosis, prognosis, self care, activity modification and exercises     Plan of care has been reviewed with PTA    G-Codes (GP)  Mobility  [phone] Current  CK= 40-59%  [id number] Goal  CK= 40-59%      The severity rating is based on clinical judgment and the FOTO score.    Certification Period: N/A  Butch Penny, PT 01/01/2017 6:51 PM    ________________________________________________________________________    I certify that the above Therapy Services are  being furnished while the patient is under my care. I agree with the treatment plan and certify that this therapy is necessary.    10 Signature:____________________  Date:____________Time: _________    Please sign and return to [facility] ??? [facility]  [address], [place] [address]  [phone] fax

## 2017-01-01 NOTE — Progress Notes (Addendum)
PT DAILY TREATMENT NOTE/LUMBAR EVAL 3-16    Patient Name: Elaine [name]  Date:01/01/2017  DOB: 02-22-1951    Patient DOB Verified  Payor: [employer] / Plan: [place] HMO / Product Type: HMO /    In time:4:35  Out time:5:23  Total Treatment Time (min): 48  Visit #: 1 of 10    Treatment Area: Low back pain [M54.5]  SUBJECTIVE  Pain Level (0-10 scale): 4  constant intermittent improving worsening no change since onset    Any medication changes, allergies to medications, adverse drug reactions, diagnosis change, or new procedure performed?:  No     Yes (see summary sheet for update)  Subjective functional status/changes:     Subjective: pt c/o low back pain from OA with increased pain recently.  Pain stays in the low back and affects her posture at the end of the work day, causing a forward flexed posture.  Pain progressed from acting up maybe once a year with receiving injections every 4-6 months to a pain on a regular basis.  Does not hurt all day every day. 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 Work, travel, read    Limitations to Activity/PLOF:       Current Health Status:  Good    Barriers: pain financial time transportation other    Goal: Find out something I can do so it doesn't hurt as bad.  Try and knock out some of the pain    FOTO: 47 / 56 in 12 visits Mob C CK, G CK    Motivation: Good     Substance use: Alcohol Tobacco other:     FABQ Score: low elevate    Cognition: A & O x 3    Other:        OBJECTIVE/EXAMINATION  - Gait: Awkward gait with deviations/LOB -Pt reports no falls  -  Fingertip - floor 0 cm  -  C-Curve (L)  - Straight spine in prone  - Elev (R) Innominate  - Elev (R) Sacral base  - MMT (B) Hip Flx 3+/5  - Weak abdominals      Active Movements:  N/A    Too acute    Other:  ROM % AROM % PROM Comments:pain, area   Forward flexion 40-60  Static    15*  0CM     Extension 20-30 30     SB right 20-30 WNL     SB left 20-30 WNL     Rotation right 5-10 50%     Rotation left 5-10 75%               20 min Eval  Re-Eval       14 min Therapeutic Exercise:   See flow sheet :Develop and instruct HEP   Rationale: increase ROM, increase strength and improve coordination to improve the patient???s ability to tolerate increased activity levels    14 min Manual Therapy:  (B) Innominate P/A mobs, Inferior glide (R) sacral base, (B) LE LAD, (B) Lx/Tx Rot/P/A mobs   Rationale: decrease pain, increase ROM and increase tissue extensibility to tolerate increased activity levels        With    TE    TA    neuro    other: Patient Education:  Review HEP     Progressed/Changed HEP based on:    positioning    body mechanics    transfers    heat/ice application     other:      Other Objective/Functional Measures:   - Improved pelvic alignment and mobility after treatment  - Improved gait with less deviations  -         Pain Level (0-10 scale) post treatment: 4    ASSESSMENT/Changes in Function:      Patient will continue to benefit from skilled PT services to modify and progress therapeutic interventions, address functional mobility deficits, address ROM deficits, address strength deficits, analyze and address soft tissue restrictions and analyze and cue movement patterns to attain remaining goals.       See Plan of Care    See progress note/recertification    See Discharge Summary          Progress towards goals / Updated goals:      PLAN    Upgrade activities as tolerated       Continue plan of care    Update interventions per flow sheet         Discharge due to:_    Other:_      Butch Penny, PT 01/01/2017  4:31 PM

## 2017-01-08 MED ORDER — LEVOTHYROXINE 150 MCG TAB
150 mcg | ORAL_TABLET | ORAL | 1 refills | Status: DC
Start: 2017-01-08 — End: 2017-06-10

## 2017-01-08 NOTE — Telephone Encounter (Signed)
Elaine [name] with [facility] called and said that she needs the new order for chronic lower back pain so patient can schedule apt. Referral tab only has PT orders from 2016 and they will need a different one than that.     Please advise Elaine [name] [phone]  Fax [phone]

## 2017-01-08 NOTE — Telephone Encounter (Signed)
Synthroid refilled and sent to pharmacy. Please notify pt.

## 2017-01-08 NOTE — Telephone Encounter (Signed)
Order for PT placed for Dr Doreatha Martin to sign.

## 2017-01-11 ENCOUNTER — Inpatient Hospital Stay
Admit: 2017-01-11 | Payer: PRIVATE HEALTH INSURANCE | Attending: Rehabilitative and Restorative Service Providers" | Primary: Internal Medicine

## 2017-01-11 DIAGNOSIS — M545 Low back pain: Secondary | ICD-10-CM

## 2017-01-11 NOTE — Telephone Encounter (Signed)
I called April from [facility].  Sts she has order for PT and has contacted Pt.

## 2017-01-11 NOTE — Progress Notes (Signed)
PT DAILY TREATMENT NOTE - MCR 12-16    Patient Name: Elaine [name]  Date:01/11/2017  DOB: 05-14-1951    Patient DOB Verified  Payor: CIGNA / Plan: [place] HMO / Product Type: HMO /    In time:4:37  Out time:5:17  Total Treatment Time (min): 40  Total Timed Codes (min): 40  1:1 Treatment Time ([place] only): 30   Visit #: 2 of 10    Treatment Area: Low back pain [M54.5]    SUBJECTIVE  Pain Level (0-10 scale): 4  Any medication changes, allergies to medications, adverse drug reactions, diagnosis change, or new procedure performed?:  No     Yes (see summary sheet for update)  Subjective functional status/changes:    No changes reported  The pain yesterday was 8-9 " I think from the weather."    OBJECTIVE    Modality rationale: decrease inflammation, decrease pain and increase tissue extensibility to improve the patient???s ability to peform ADLs.    Min Type Additional Details     Estim:  Unatt       IFC  Premod                        Other:  w/ice   w/heat  Position:  Location:     Estim: Att    TENS instruct  NMES                    Other:  w/US   w/ice   w/heat  Position:  Location:      Traction:  Cervical       Lumbar                        Prone          Supine                       Intermittent   Continuous Lbs:   before manual   after manual      Ultrasound: Continuous    Pulsed                           1MHz   3MHz W/cm2:  Location:      Iontophoresis with dexamethasone         Location:  Take home patch    In clinic   10   Ice       heat    Ice massage    Laser     Anodyne Position:supine  Location: low back       Laser with stim    Other:  Position:  Location:      Vasopneumatic Device Pressure:        lo  med  hi   Temperature:  lo  med  hi    Skin assessment post-treatment:  intact redness- no adverse reaction    redness ??? adverse reaction:       min Eval                  Re-Eval       20 min Therapeutic Exercise:   See flow sheet :    Rationale: increase ROM, increase strength, improve coordination, improve balance and increase proprioception to improve the patient???s ability to perform daily tasks.      10 min Neuromuscular Re-education:    See flow  sheet :TA bracing, deep breathing strategies, OOV training.    Rationale: increase ROM, increase strength, improve coordination, improve balance and increase proprioception  to improve the patient???s ability to perform daily tasks.          With    TE    TA    neuro    other: Patient Education:  Review HEP     Progressed/Changed HEP based on:    positioning    body mechanics    transfers    heat/ice application     other:      Other Objective/Functional Measures: tolerated use of small OOV fair this date with CGA/SBA due to LOB. Pt engaged UE and legs to maintain balance on device.      Pain Level (0-10 scale) post treatment: 0 back, 7 for Lower leg soreness.     ASSESSMENT/Changes in Function: pt needed constant cues for TA bracing and observation of core fatigue present. Pt also reported increased pain and stiffness in left UE with overhead reaches on device suggesting soft tissue restrictions.     Patient will continue to benefit from skilled PT services to modify and progress therapeutic interventions, address functional mobility deficits, address ROM deficits, address strength deficits, analyze and address soft tissue restrictions, analyze and cue movement patterns, analyze and modify body mechanics/ergonomics, assess and modify postural abnormalities, address imbalance/dizziness and instruct in home and community integration to attain remaining goals.       See Plan of Care    See progress note/recertification    See Discharge Summary         Progress towards goals / Updated goals:  Short Term Goals: To be accomplished in 5 treatments:  1. ??Pt will be compliant and independent with HEP in order to facilitate PT sessions and aid with self management  ????????????????????????????????????????????Eval Status: ??Initiated   ????????????????????????????????????????????Current Status: compliant.   2. ??Pt to tolerate 30 min or more of TE and/or Interventions w/o increased s/s  ????????????????????????????????????????????Eval Status: ??Initiated  ????????????????????????????????????????????Current Status: tolerated 30 min with minimal increase in s/sx.   Long Term Goals: To be accomplished in 10 treatments:  1. ??Pt will report 50% improvement or better with involvement to show a significant increase in function  ????????????????????????????????????????????Eval Status: ??Initiated  ????????????????????????????????????????????Current Status: n/a  2. 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 ??Pt will have improved upright posturing to -10* or better to show improved postural alignment and increased core stability to aid with walking tolerance  ??  ????????????????????????????????????????????Eval Status: ??Initiated  ????????????????????????????????????????????Current Status: n/a  4. ??Pt will negotiate 1 flight of steps x 2 Reps to transition up and down her stairs at home  ????????????????????????????????????????????Eval Status: ??Difficulty with negotiating stairs  ????????????????????????????????????????????Current Status: n/a  5. ??Pt will improve FOTO score to 56 in 12 visits to show significant improvement in function for progress to little difficulty with usual daily work tasks  ????????????????????????????????????????????Eval Status: 47  ????????????????????????????????????????????Current Status:n/a  PLAN    Upgrade activities as tolerated       Continue plan of care    Update interventions per flow sheet         Discharge due to:_    Other:_      Arbutus Leas, PT 01/11/2017  5:13 PM    Future Appointments  Date Time Provider [place]   01/14/2017 4:00 PM Crystal Midway, PTA [facility]  01/18/2017 4:00 PM Crystal Smalls, PTA [facility]   01/22/2017 5:30 PM Butch Penny, PT [facility]   01/27/2017 5:00 PM Crystal Smalls, PTA [facility]   01/29/2017 4:30 PM Crystal Smalls, PTA [facility]    02/01/2017 10:00 AM Carlyle Dolly, MD [facility]   02/01/2017 4:30 PM Forrest Moron, PT [facility]   02/03/2017 4:30 PM Crystal Smalls, PTA [facility]

## 2017-01-11 NOTE — Telephone Encounter (Signed)
PT order signed.

## 2017-01-14 ENCOUNTER — Encounter: Payer: PRIVATE HEALTH INSURANCE | Primary: Internal Medicine

## 2017-01-18 ENCOUNTER — Inpatient Hospital Stay: Admit: 2017-01-18 | Payer: PRIVATE HEALTH INSURANCE | Primary: Internal Medicine

## 2017-01-18 NOTE — Progress Notes (Signed)
PT DAILY TREATMENT NOTE 12-16    Patient Name: Elaine [name]  Date:01/18/2017  DOB: Apr 01, 1951    Patient DOB Verified  Payor: CIGNA / Plan: [place] HMO / Product Type: HMO /    In time:4:11  Out time:4:50  Total Treatment Time (min):33  Visit #: 2 of 10    Treatment Area: Low back pain [M54.5]    SUBJECTIVE  Pain Level (0-10 scale): 4  Any medication changes, allergies to medications, adverse drug reactions, diagnosis change, or new procedure performed?:  No     Yes (see summary sheet for update)  Subjective functional status/changes:    No changes reported  Stil some pain.  OBJECTIVE    Modality rationale: decrease edema, decrease inflammation, decrease pain and increase tissue extensibility to improve the patient???s ability to perform ADL    Min Type Additional Details     Estim:  Unatt       IFC  Premod                        Other:  w/ice   w/heat  Position:  Location:     Estim: Att    TENS instruct  NMES                    Other:  w/US   w/ice   w/heat  Position:  Location:      Traction:  Cervical       Lumbar                        Prone          Supine                       Intermittent   Continuous Lbs:   before manual   after manual      Ultrasound: Continuous    Pulsed                           1MHz   3MHz W/cm2:  Location:      Iontophoresis with dexamethasone         Location:  Take home patch    In clinic   10   Ice       heat  post    Ice massage    Laser     Anodyne Position:supine  Location:(B) LSP      Laser with stim    Other:  Position:  Location:      Vasopneumatic Device Pressure:        lo  med  hi   Temperature:  lo  med  hi    Skin assessment post-treatment:  intact redness- no adverse reaction    redness ??? adverse reaction:      min Eval                  Re-Eval       23 min Therapeutic Exercise:   See flow sheet :   Rationale: increase ROM and increase strength to improve the patient???s ability to perform ADL      min Therapeutic Activity:    See flow sheet :    Rationale:   to improve the patient???s ability to       min Neuromuscular Re-education:    See flow sheet :   Rationale:  to improve the patient???s ability to      min Manual Therapy:     Rationale:  to      min Gait Training:  ___ feet with ___ device on level surfaces with ___ level of assist   Rationale:          With    TE    TA    neuro    other: Patient Education:  Review HEP     Progressed/Changed HEP based on:    positioning    body mechanics    transfers    heat/ice application     other:      Other Objective/Functional Measures:   Responded fairly  Well  To each there ex.    Pain Level (0-10 scale) post treatment: 0    ASSESSMENT/Changes in Function: Benefited  With treatment.    Patient will continue to benefit from skilled PT services to address functional mobility deficits, address ROM deficits, address strength deficits, analyze and address soft tissue restrictions, analyze and cue movement patterns and instruct in home and community integration to attain remaining goals.       See Plan of Care    See progress note/recertification    See Discharge Summary         Progress towards goals / Updated goals:  Short Term Goals:??To be accomplished in 5??treatments:  1. ??Pt will be compliant and independent with HEP in order to facilitate PT sessions and aid with self management  ????????????????????????????????????????????Eval Status: ??Initiated  ????????????????????????????????????????????Current Status: compliant.   2. ??Pt to tolerate 30 min or more of TE and/or Interventions w/o increased s/s  ????????????????????????????????????????????Eval Status: ??Initiated  ????????????????????????????????????????????Current Status: tolerated 30 min with minimal increase in s/sx.   Long Term Goals:??To be accomplished in 10??treatments:  1. ??Pt will report 50% improvement or better with involvement to show a significant increase in function  ????????????????????????????????????????????Eval Status: ??Initiated  ????????????????????????????????????????????Current Status: n/a  2. 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  ????????????????????????????????????????????Eval Status: ??Initiated  ????????????????????????????????????????????Current Status: n/a  3. ??Pt will have improved upright posturing to -10* or better to show improved postural alignment and increased core stability to aid with walking tolerance ????  ????????????????????????????????????????????Eval Status: ??Initiated  ????????????????????????????????????????????Current Status: n/a  4. ??Pt will negotiate 1 flight of steps x 2 Reps to transition up and down her stairs at home  ????????????????????????????????????????????Eval Status: ??Difficulty with negotiating stairs  ????????????????????????????????????????????Current Status: n/a  5. ??Pt will improve FOTO score to 56 in 12 visits??to show significant improvement in function for progress to little difficulty with usual daily work tasks  ????????????????????????????????????????????Eval Status: 47  ????????????????????????????????????????????Current Status:n/a    PLAN    Upgrade activities as tolerated       Continue plan of care    Update interventions per flow sheet         Discharge due to:_    Other:_      Jasmon Mattice, PTA 01/18/2017  5:18 PM    Future Appointments  Date Time Provider [place]   01/22/2017 5:30 PM Butch Penny, PT [facility]   01/27/2017 5:00 PM Mykell Genao Blythe, PTA [facility]   01/29/2017 4:30 PM Gregg Holster, PTA [facility]   02/01/2017 10:00 AM Carlyle Dolly, MD [facility]   02/01/2017 4:30 PM Forrest Moron, PT [facility]   02/03/2017 4:30 PM Jimmy Plessinger, PTA [facility]

## 2017-01-22 ENCOUNTER — Inpatient Hospital Stay: Admit: 2017-01-22 | Payer: PRIVATE HEALTH INSURANCE | Primary: Internal Medicine

## 2017-01-22 NOTE — Progress Notes (Signed)
PT DAILY TREATMENT NOTE 3-16    Patient Name: Elaine NakayamaJuanita A [name]  Date:01/22/2017  DOB: 1951/10/05    Patient DOB Verified  Payor: CIGNA / [place]: [place] [employer] HMO / Product Type: HMO /    In time:5:28  Out time:6:30  Total Treatment Time (min): 43  Visit #: 4 of 10    Treatment Area: Low back pain [M54.5]    SUBJECTIVE  Pain Level (0-10 scale): 4  Any medication changes, allergies to medications, adverse drug reactions, diagnosis change, or new procedure performed?:  No     Yes (see summary sheet for update)  Subjective functional status/changes:    No changes repor  Some pain     OBJECTIVE  28 min Therapeutic Exercise:   See flow sheet :   Rationale: increase ROM, increase strength and improve coordination to improve the patient???s ability to perform increased ADL  15 min Manual Therapy:  Inf glid (R) sacral base, (B) Lx rot mobs, (B) Innominate p/a mobs, (B) LE LAD   Rationale: decrease pain, increase ROM, increase tissue extensibility and decrease trigger points to perform increased ADL         With    TE    TA    neuro    other: Patient Education:  Review HEP     Progressed/Changed HEP based on:    positioning    body mechanics    transfers    heat/ice application     other:      Other Objective/Functional Measures:   - (R) Innominate Elevated  - Dec mob (R) innominate in stork stance  - Increased pain after exercises to 5/10  - - Pain decreased after manual therapy     Pain Level (0-10 scale) post treatment: No pain just a little sore    ASSESSMENT/Changes in Function:      Patient will continue to benefit from skilled PT services to modify and progress therapeutic interventions, address functional mobility deficits, address ROM deficits, address strength deficits, analyze and address soft tissue restrictions and analyze and cue movement patterns to attain remaining goals.       See Plan of Care    See progress note/recertification    See Discharge Summary         Progress towards goals / Updated goals:   Short Term Goals:??To be accomplished in 5??treatments:  1. ??Pt will be compliant and independent with HEP in order to facilitate PT sessions and aid with self management  ????????????????????????????????????????????Eval Status: ??Initiated  ????????????????????????????????????????????Current Status: compliant.  01/22/17  2. ??Pt to tolerate 30 min or more of TE and/or Interventions w/o increased s/s  ????????????????????????????????????????????Eval Status: ??Initiated  ????????????????????????????????????????????Current Status: tolerated 30 min with minimal increase in s/sx. 01/23/16   Long Term Goals:??To be accomplished in 10??treatments:  1. ??Pt will report 50% improvement or better with involvement to show a significant increase in function  ????????????????????????????????????????????Eval Status: ??Initiated  ????????????????????????????????????????????Current Status: n/a  2. 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  ????????????????????????????????????????????Eval Status: ??Initiated  ????????????????????????????????????????????Current Status: n/a  3. ??Pt will have improved upright posturing to -10* or better to show improved postural alignment and increased core stability to aid with walking tolerance ????  ????????????????????????????????????????????Eval Status: ??Initiated  ????????????????????????????????????????????Current Status: n/a  4. ??Pt will negotiate 1 flight of steps x 2 Reps to transition up and down her stairs at home  ????????????????????????????????????????????Eval Status: ??Difficulty with negotiating stairs  ????????????????????????????????????????????Current Status: n/a  5. ??  Pt will improve FOTO score to 56 in 12 visits??to show significant improvement in function for progress to little difficulty with usual daily work tasks  ????????????????????????????????????????????Eval Status: 47  ????????????????????????????????????????????Current Status:n/a  ??    PLAN    Upgrade activities as tolerated       Continue plan of care    Update interventions per flow sheet         Discharge due to:_    Other:_      Butch Penny, PT 01/22/2017  5:31 PM    Future Appointments  Date Time Provider [place]   01/27/2017 5:00 PM [place], [place] [facility]    01/29/2017 4:30 PM Crystal Smalls, PTA [facility]   02/01/2017 10:00 AM Carlyle Dolly, MD [facility]   02/01/2017 4:30 PM Forrest Moron, PT [facility]   02/03/2017 4:30 PM Crystal Smalls, PTA [facility]

## 2017-01-27 ENCOUNTER — Inpatient Hospital Stay: Admit: 2017-01-27 | Payer: PRIVATE HEALTH INSURANCE | Primary: Internal Medicine

## 2017-01-27 NOTE — Progress Notes (Signed)
PT DAILY TREATMENT NOTE 12-16    Patient Name: Elaine [name]  Date:01/27/2017  DOB: [birth year]  [x]   Patient DOB Verified  Payor: CIGNA / Plan: [place] HMO / Product Type: HMO /    In time:5:12  Out time:6:00  Total Treatment Time (min):33  Visit #: 5 of 10    Treatment Area: Low back pain [M54.5]    SUBJECTIVE  Pain Level (0-10 scale):3  Any medication changes, allergies to medications, adverse drug reactions, diagnosis change, or new procedure performed?: [x]  No    []  Yes (see summary sheet for update)  Subjective functional status/changes:   []  No changes reported  Some  Tightness.    OBJECTIVE    Modality rationale: decrease edema, decrease inflammation, decrease pain and increase tissue extensibility to improve the patient???s ability to perform ADL    Min Type Additional Details    []  Estim:  [] Unatt       [] IFC  [] Premod                        [] Other:  [] w/ice   [] w/heat  Position:  Location:    []  Estim: [] Att    [] TENS instruct  [] NMES                    [] Other:  [] w/US   [] w/ice   [] w/heat  Position:  Location:    []   Traction: []  Cervical       [] Lumbar                       []  Prone          [] Supine                       [] Intermittent   [] Continuous Lbs:  []  before manual  []  after manual    []   Ultrasound: [] Continuous   []  Pulsed                           [] 1MHz   [] 3MHz W/cm2:  Location:    []   Iontophoresis with dexamethasone         Location: []  Take home patch   []  In clinic    []   Ice     []   heat  []   Ice massage  []   Laser   []   Anodyne Position:  Location:    []   Laser with stim  []   Other:  Position:  Location:    []   Vasopneumatic Device Pressure:       []  lo []  med []  hi   Temperature: []  lo []  med []  hi   [x]  Skin assessment post-treatment:  [x] intact [] redness- no adverse reaction    [] redness ??? adverse reaction:      min [] Eval                  [] Re-Eval       33 min Therapeutic Exercise:  [x]  See flow sheet :    Rationale: increase ROM and increase strength to improve the patient???s ability to perform ADL      min Therapeutic Activity:  []   See flow sheet :   Rationale:   to improve the patient???s ability to       min Neuromuscular Re-education:  []   See flow sheet :   Rationale:   to improve  the patient???s ability to      min Manual Therapy:     Rationale:  to      min Gait Training:  ___ feet with ___ device on level surfaces with ___ level of assist   Rationale:          With   [x]  TE   []  TA   []  neuro   []  other: Patient Education: [x]  Review HEP    []  Progressed/Changed HEP based on:   []  positioning   []  body mechanics   []  transfers   []  heat/ice application    []  other:      Other Objective/Functional Measures: FOTO: 47    Pain Level (0-10 scale) post treatment: 3    ASSESSMENT/Changes in Function: Completed  Each there ex  Fairly  Well.FOTO has remained  The  Same.  Pt  Requested  Not  To have MH  Due  To had  To leave  For  Bible  Study.    Patient will continue to benefit from skilled PT services to address functional mobility deficits, address ROM deficits, address strength deficits, analyze and address soft tissue restrictions, analyze and cue movement patterns and instruct in home and community integration to attain remaining goals.     [x]   See Plan of Care  []   See progress note/recertification  []   See Discharge Summary         Progress towards goals / Updated goals:  Short Term Goals:??To be accomplished in 5??treatments:  1. ??Pt will be compliant and independent with HEP in order to facilitate PT sessions and aid with self management  ????????????????????????????????????????????Eval Status: ??Initiated  ????????????????????????????????????????????Current Status: compliant.  01/22/17  2. ??Pt to tolerate 30 min or more of TE and/or Interventions w/o increased s/s  ????????????????????????????????????????????Eval Status: ??Initiated  ????????????????????????????????????????????Current Status: tolerated 30 min with minimal increase in s/sx. 01/23/16    Long Term Goals:??To be accomplished in 10??treatments:  1. ??Pt will report 50% improvement or better with involvement to show a significant increase in function  ????????????????????????????????????????????Eval Status: ??Initiated  ????????????????????????????????????????????Current Status: n/a  2. 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  ????????????????????????????????????????????Eval Status: ??Initiated  ????????????????????????????????????????????Current Status: n/a  3. ??Pt will have improved upright posturing to -10* or better to show improved postural alignment and increased core stability to aid with walking tolerance ????  ????????????????????????????????????????????Eval Status: ??Initiated  ????????????????????????????????????????????Current Status: n/a  4. ??Pt will negotiate 1 flight of steps x 2 Reps to transition up and down her stairs at home  ????????????????????????????????????????????Eval Status: ??Difficulty with negotiating stairs  ????????????????????????????????????????????Current Status: n/a  5. ??Pt will improve FOTO score to 56 in 12 visits??to show significant improvement in function for progress to little difficulty with usual daily work tasks  ????????????????????????????????????????????Eval Status: 47  ????????????????????????????????????????????Current Status:47   01/27/17    PLAN  []   Upgrade activities as tolerated     []   Continue plan of care  []   Update interventions per flow sheet       []   Discharge due to:_  []   Other:_      Kitzia Camus, PTA 01/27/2017  5:50 PM    Future Appointments  Date Time Provider [place]   01/29/2017 4:30 PM Willaim Rayas, [place] [facility]   02/01/2017 10:00 AM Carlyle Dolly, MD [facility]   02/01/2017 4:30 PM Forrest Moron, PT [facility]   02/03/2017 4:30 PM Auset Fritzler, PTA [facility]

## 2017-01-29 ENCOUNTER — Encounter: Payer: PRIVATE HEALTH INSURANCE | Primary: Internal Medicine

## 2017-02-01 ENCOUNTER — Encounter: Payer: PRIVATE HEALTH INSURANCE | Primary: Internal Medicine

## 2017-02-01 ENCOUNTER — Ambulatory Visit
Admit: 2017-02-01 | Discharge: 2017-02-01 | Payer: PRIVATE HEALTH INSURANCE | Attending: Specialist | Primary: Internal Medicine

## 2017-02-01 DIAGNOSIS — I5032 Chronic diastolic (congestive) heart failure: Secondary | ICD-10-CM

## 2017-02-01 MED ORDER — CARVEDILOL 6.25 MG TAB
6.25 mg | ORAL_TABLET | Freq: Two times a day (BID) | ORAL | 3 refills | Status: DC
Start: 2017-02-01 — End: 2017-05-29

## 2017-02-01 MED ORDER — VALSARTAN-HYDROCHLOROTHIAZIDE 160 MG-25 MG TAB
160-25 mg | ORAL_TABLET | ORAL | 2 refills | Status: DC
Start: 2017-02-01 — End: 2017-11-30

## 2017-02-01 NOTE — Patient Instructions (Addendum)
Medications Discontinued During This Encounter   Medication Reason   ??? valsartan-hydroCHLOROthiazide (DIOVAN-HCT) 160-25 mg per tablet Reorder       Orders Placed This Encounter   ??? METABOLIC PANEL, BASIC     Standing Status:   Future     Standing Expiration Date:   05/04/2017   ??? LIPID PANEL     Standing Status:   Future     Standing Expiration Date:   05/04/2017   ??? HEPATIC FUNCTION PANEL     Standing Status:   Future     Standing Expiration Date:   05/04/2017   ??? valsartan-hydroCHLOROthiazide (DIOVAN-HCT) 160-25 mg per tablet     Sig: take 1 tablet by mouth daily     Dispense:  90 Tab     Refill:  2   ??? carvedilol (COREG) 6.25 mg tablet     Sig: Take 1 Tab by mouth two (2) times daily (with meals).     Dispense:  60 Tab     Refill:  3          Body Mass Index: Care Instructions  Your Care Instructions    Body mass index (BMI) can help you see if your weight is raising your risk for health problems. It uses a formula to compare how much you weigh with how tall you are.  ?? A BMI lower than 18.5 is considered underweight.  ?? A BMI between 18.5 and 24.9 is considered healthy.  ?? A BMI between 25 and 29.9 is considered overweight. A BMI of 30 or higher is considered obese.  If your BMI is in the normal range, it means that you have a lower risk for weight-related health problems. If your BMI is in the overweight or obese range, you may be at increased risk for weight-related health problems, such as high blood pressure, heart disease, stroke, arthritis or joint pain, and diabetes. If your BMI is in the underweight range, you may be at increased risk for health problems such as fatigue, lower protection (immunity) against illness, muscle loss, bone loss, hair loss, and hormone problems.  BMI is just one measure of your risk for weight-related health problems. You may be at higher risk for health problems if you are not active, you eat an unhealthy diet, or you drink too much alcohol or use tobacco products.   Follow-up care is a key part of your treatment and safety. Be sure to make and go to all appointments, and call your doctor if you are having problems. It's also a good idea to know your test results and keep a list of the medicines you take.  How can you care for yourself at home?  ?? Practice healthy eating habits. This includes eating plenty of fruits, vegetables, whole grains, lean protein, and low-fat dairy.  ?? If your doctor recommends it, get more exercise. Walking is a good choice. Bit by bit, increase the amount you walk every day. Try for at least 30 minutes on most days of the week.  ?? Do not smoke. Smoking can increase your risk for health problems. If you need help quitting, talk to your doctor about stop-smoking programs and medicines. These can increase your chances of quitting for good.  ?? Limit alcohol to 2 drinks a day for men and 1 drink a day for women. Too much alcohol can cause health problems.  If you have a BMI higher than 25  ?? Your doctor may do other tests to check your  risk for weight-related health problems. This may include measuring the distance around your waist. A waist measurement of more than 40 inches in men or 35 inches in women can increase the risk of weight-related health problems.  ?? Talk with your doctor about steps you can take to stay healthy or improve your health. You may need to make lifestyle changes to lose weight and stay healthy, such as changing your diet and getting regular exercise.  If you have a BMI lower than 18.5  ?? Your doctor may do other tests to check your risk for health problems.  ?? Talk with your doctor about steps you can take to stay healthy or improve your health. You may need to make lifestyle changes to gain or maintain weight and stay healthy, such as getting more healthy foods in your diet and doing exercises to build muscle.  Where can you learn more?  Go to [email or web address].   Enter S176 in the search box to learn more about "Body Mass Index: Care Instructions."  Current as of: September 19, 2015  Content Version: 11.4  ?? 2006-2017 [employer]. Care instructions adapted under license by [employer] (which disclaims liability or warranty for this information). If you have questions about a medical condition or this instruction, always ask your healthcare professional. Thornton any warranty or liability for your use of this information.

## 2017-02-01 NOTE — Progress Notes (Signed)
Patient brought medications list    1. Have you been to the ER, urgent care clinic since your last visit?  Hospitalized since your last visit?     No    2. Have you seen or consulted any other health care providers outside of the [facility] since your last visit?  Include any pap smears or colon screening.      No     3.  Since your last visit, have you had any of the following symptoms?      chest pains, palpitations and shortness of breath.    4.  Have you had any blood work, X-rays or cardiac testing?      No    5.  Where do you normally have your labs drawn?   PCP     6. Do you need any refills today?   No

## 2017-02-01 NOTE — Progress Notes (Signed)
HISTORY OF PRESENT ILLNESS  Elaine [name] is a 66 y.o. female.    Hypertension   The history is provided by the medical records. This is a chronic problem. Associated symptoms include shortness of breath.   Cholesterol Problem   The history is provided by the medical records. This is a chronic problem. Associated symptoms include shortness of breath.   Shortness of Breath   The history is provided by the patient. This is a new problem. The problem occurs intermittently.The current episode started more than 1 week ago (9/17). The problem has not changed since onset.Pertinent negatives include no fever, no cough, no wheezing, no PND, no orthopnea, no vomiting, no rash, no leg swelling and no claudication. The problem's precipitants include exercise (steps).       Review of Systems   Constitutional: Negative for chills, fever, malaise/fatigue and weight loss.   HENT: Negative for nosebleeds.    Eyes: Negative for discharge.   Respiratory: Positive for shortness of breath. Negative for cough and wheezing.    Cardiovascular: Negative for palpitations, orthopnea, claudication, leg swelling and PND.   Gastrointestinal: Negative for diarrhea, nausea and vomiting.   Genitourinary: Negative for dysuria and hematuria.   Musculoskeletal: Negative for joint pain.   Skin: Negative for rash.   Neurological: Negative for dizziness, seizures and loss of consciousness.   Endo/Heme/Allergies: Negative for polydipsia. Does not bruise/bleed easily.   Psychiatric/Behavioral: Negative for depression and substance abuse. The patient does not have insomnia.      Allergies   Allergen Reactions   ??? Asa-Acetaminophen-Caff-Potass Other (comments)     Bleeding in stomach   ??? Benadryl [Diphenhydramine Hcl] Other (comments)     Muscle jerking   ??? Other Plant, Animal, Environmental Not Reported This Time     Grass, Dust and Mold   ??? Pollen Extracts Not Reported This Time       Past Medical History:   Diagnosis Date   ??? Anemia    ??? Asthma     ??? Colon polyp    ??? DDD (degenerative disc disease) 02/15/2009   ??? Depression 12/18/2011   ??? Essential hypertension, benign    ??? History of echocardiogram 03/20/2004    EF 70%.  Borderline DDfx.  No significant valvular pathology.   ??? Hypercholesterolemia    ??? Hypertension    ??? Iron deficiency anemia 99991111   ??? 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 Lower extremity venous duplex 10/23/2009    Left leg:  No DVT.     ??? Meralgia paraesthetica 10/22/2013   ??? OA (osteoarthritis of spine) 02/15/2009   ??? OA (osteoarthritis of spine) 02/15/2009   ??? Obesity, unspecified    ??? Other and unspecified hyperlipidemia    ??? Pre-operative cardiovascular examination     For spine surgery   ??? Right sided sciatica    ??? Shortness of breath     Possible asthma, HCVD; less likely CAD (Noted 03/15/09)   ??? Sleep apnea 02/15/2009    uses cpap machine   ??? Thallium stress test  03/20/2004    Partially transient, mod basal & mid anterior defect most c/w artifact; mild anterior ischemia less likely.  Neg EKG on pharm stress test.   ??? Thyroid disease     hypothyroidism       Family History   Problem Relation Age of Onset   ??? Cancer Mother    ??? Hypertension Mother    ??? Hypertension Sister      x3   ???  Heart Surgery Sister    ??? Heart Disease Sister    ??? Heart Attack Sister    ??? Cancer Maternal Aunt      breast   ??? Breast Cancer Maternal Aunt    ??? Glaucoma Son    ??? Heart defect Son    ??? Diabetes Paternal Uncle    ??? Hypertension Paternal Uncle        Social History   Substance Use Topics   ??? Smoking status: Former Smoker     Years: 28.00     Quit date: 04/25/2004   ??? Smokeless tobacco: Never Used   ??? Alcohol use No        Current Outpatient Prescriptions   Medication Sig   ??? levothyroxine (SYNTHROID) 150 mcg tablet take 1 tablet by mouth once daily BEFORE BREAKFAST   ??? atorvastatin (LIPITOR) 40 mg tablet TAKE 1 TABLET BY MOUTH DAILY   ??? omeprazole (PRILOSEC) 40 mg capsule TAKE 1 CAPSULE BY MOUTH ONCE A DAY BEFORE BREAKFAST    ??? gabapentin (NEURONTIN) 300 mg capsule take 1 capsule by mouth every morning and 2 capsules by mouth at bedtime   ??? montelukast (SINGULAIR) 10 mg tablet take 1 tablet by mouth at bedtime if needed   ??? valsartan-hydroCHLOROthiazide (DIOVAN-HCT) 160-25 mg per tablet take 1 tablet by mouth daily   ??? VARICELLA-ZOSTER VACINE LIVE 19,400 unit/0.65 mL susr injection    ??? budesonide-formoterol (SYMBICORT) 160-4.5 mcg/actuation HFA inhaler Take 2 Puffs by inhalation two (2) times a day.   ??? Cholecalciferol, Vitamin D3, (VITAMIN D-3) 5,000 unit Tab Take  by mouth daily.     No current facility-administered medications for this visit.         Past Surgical History:   Procedure Laterality Date   ??? HX COLONOSCOPY  02-24-12    normal (Dr. Angelique Holm)   ??? HX HERNIA REPAIR      3x   ??? HX HYSTERECTOMY     ??? HX TUBAL LIGATION     ??? NEUROLOGICAL PROCEDURE UNLISTED  03-16-2009    s/p ACD & Fusion (Dr.P.Gurtner)   ??? PR COLONOSCOPY FLX DX W/COLLJ SPEC WHEN PFRMD  02-2007    +polyp(tubular adenoma); Dr Angelique Holm       Visit Vitals   ??? BP 135/71   ??? Pulse 87   ??? Ht 5\' 2"  (1.575 m)   ??? Wt 197 lb (89.4 kg)   ??? BMI 36.03 kg/m2       Diagnostic Studies:  I have reviewed the relevant tests done on the patient and show as follows  EKG tracings reviewed by me today.    No flowsheet data found.  1/18 Nuc Stress  Diagnosis:   1. Probably normal scan.    2. Evidence of a small mild fixed inferior wall defect noted from his nuclear study suggestive of tissue attenuation with normal wall motion in the area.    3. No reversible defect suggesting ischemia noted from his nuclear study.   4. Normal left ventricular size and systolic function.   5. Low risk scan.     1/18 ECHO  SUMMARY:  Left ventricle: Systolic function was hyperdynamic. Ejection fraction was  estimated in the range of 70 % to 75 %. There were no regional wall motion  abnormalities. There was mild concentric hypertrophy. Doppler parameters   were consistent with abnormal left ventricular relaxation (grade 1  diastolic dysfunction).    Mitral valve: There was mild annular calcification.    Elaine [name]  has a reminder for a "due or due soon" health maintenance. I have asked that she contact her primary care provider for follow-up on this health maintenance.    Physical Exam   Constitutional: She is oriented to person, place, and time. She appears well-developed and well-nourished. No distress.   sev obese   HENT:   Head: Normocephalic and atraumatic.   Mouth/Throat: Normal dentition.   Eyes: Right eye exhibits no discharge. Left eye exhibits no discharge. No scleral icterus.   Neck: Neck supple. No JVD present. Carotid bruit is not present. No thyromegaly present.   Cardiovascular: Normal rate, regular rhythm, S1 normal, S2 normal, normal heart sounds and intact distal pulses.  Exam reveals no gallop and no friction rub.    No murmur heard.  Pulmonary/Chest: Effort normal and breath sounds normal. She has no wheezes. She has no rales.   Abdominal: Soft. She exhibits no mass. There is no tenderness.   Musculoskeletal: She exhibits no edema.   Lymphadenopathy:        Right cervical: No superficial cervical adenopathy present.       Left cervical: No superficial cervical adenopathy present.   Neurological: She is alert and oriented to person, place, and time.   Skin: Skin is warm and dry. No rash noted.   Psychiatric: She has a normal mood and affect. Her behavior is normal.       ASSESSMENT and PLAN        Diagnoses and all orders for this visit:    1. Chronic diastolic congestive heart failure ([place])  Comments:  2/18 NYHA2, 70-75%EF, add coreg    2. HTN (hypertension), benign  -     valsartan-hydroCHLOROthiazide (DIOVAN-HCT) 160-25 mg per tablet; take 1 tablet by mouth daily  -     carvedilol (COREG) 6.25 mg tablet; Take 1 Tab by mouth two (2) times daily (with meals).  -     METABOLIC PANEL, BASIC; Future    3. Obesity (BMI 30-39.9)  Comments:   Weight loss has been strongly encouraged by following dietary restrictions and an exercise routine.      4. Hypercholesteremia  Comments:  chk lipids    Orders:  -     LIPID PANEL; Future  -     HEPATIC FUNCTION PANEL; Future        Pertinent laboratory and test data reviewed and discussed with patient.  See patient instructions also for other medical advice given    Medications Discontinued During This Encounter   Medication Reason   ??? valsartan-hydroCHLOROthiazide (DIOVAN-HCT) 160-25 mg per tablet Reorder       Follow-up Disposition:  Return in about 6 months (around 08/01/2017), or if symptoms worsen or fail to improve.

## 2017-02-03 ENCOUNTER — Encounter: Payer: PRIVATE HEALTH INSURANCE | Primary: Internal Medicine

## 2017-02-05 ENCOUNTER — Inpatient Hospital Stay: Admit: 2017-02-05 | Payer: PRIVATE HEALTH INSURANCE | Primary: Internal Medicine

## 2017-02-05 DIAGNOSIS — M545 Low back pain: Secondary | ICD-10-CM

## 2017-02-05 NOTE — Progress Notes (Signed)
PT DAILY TREATMENT NOTE 12-16    Patient Name: Elaine [name]  Date:02/05/2017  DOB: July 09, 1951  [x]   Patient DOB Verified  Payor: [employer] / Plan: [employer] HMO / Product Type: HMO /    In time:4:57  Out time:5:54  Total Treatment Time (min): 42  Visit #:6  of 10    Treatment Area: Low back pain [M54.5]    SUBJECTIVE  Pain Level (0-10 scale): 4  Any medication changes, allergies to medications, adverse drug reactions, diagnosis change, or new procedure performed?: [x]  No    []  Yes (see summary sheet for update)  Subjective functional status/changes:   []  No changes reported  Ive  Been doing  Exercise on tape at work  OBJECTIVE    Modality rationale: decrease edema, decrease inflammation, decrease pain and increase tissue extensibility to improve the patient???s ability to perform ADL   Min Type Additional Details    []  Estim:  [] Unatt       [] IFC  [] Premod                        [] Other:  [] w/ice   [] w/heat  Position:  Location:    []  Estim: [] Att    [] TENS instruct  [] NMES                    [] Other:  [] w/US   [] w/ice   [] w/heat  Position:  Location:    []   Traction: []  Cervical       [] Lumbar                       []  Prone          [] Supine                       [] Intermittent   [] Continuous Lbs:  []  before manual  []  after manual   8 [x]   Ultrasound: [x] Continuous   []  Pulsed                           [x] 1MHz   [] 3MHz W/cm2:1.3  Location:(B) LSP    []   Iontophoresis with dexamethasone         Location: []  Take home patch   []  In clinic    []   Ice     []   heat  []   Ice massage  []   Laser   []   Anodyne Position:  Location:    []   Laser with stim  []   Other:  Position:  Location:    []   Vasopneumatic Device Pressure:       []  lo []  med []  hi   Temperature: []  lo []  med []  hi   [x]  Skin assessment post-treatment:  [x] intact [] redness- no adverse reaction    [] redness ??? adverse reaction:      min [] Eval                  [] Re-Eval       34 min Therapeutic Exercise:  [x]  See flow sheet :    Rationale: increase ROM and increase strength to improve the patient???s ability to perform ADL      min Therapeutic Activity:  []   See flow sheet :   Rationale:   to improve the patient???s ability to     min Neuromuscular Re-education:  []   See flow sheet :  Rationale:   to improve the patient???s ability to      min Manual Therapy:     Rationale:  to    min Gait Training:  ___ feet with ___ device on level surfaces with ___ level of assist   Rationale:          With   [x]  TE   []  TA   []  neuro   []  other: Patient Education: [x]  Review HEP    []  Progressed/Changed HEP based on:   []  positioning   []  body mechanics   []  transfers   []  heat/ice application    []  other:      Other Objective/Functional Measures:  TTP  At (B) LSP.Discussed  With pt on importance  Of  Performing  HEP x  2  day    Pain Level (0-10 scale) post treatment: 2    ASSESSMENT/Changes in Function: Fair  Response  To each there ex.    Patient will continue to benefit from skilled PT services to address functional mobility deficits, address ROM deficits, address strength deficits, analyze and address soft tissue restrictions, analyze and cue movement patterns and instruct in home and community integration to attain remaining goals.     [x]   See Plan of Care  []   See progress note/recertification  []   See Discharge Summary         Progress towards goals / Updated goals:  Short Term Goals:??To be accomplished in 5??treatments:  1. ??Pt will be compliant and independent with HEP in order to facilitate PT sessions and aid with self management  ????????????????????????????????????????????Eval Status: ??Initiated  ????????????????????????????????????????????Current Status: compliant. ??01/22/17  2. ??Pt to tolerate 30 min or more of TE and/or Interventions w/o increased s/s  ????????????????????????????????????????????Eval Status: ??Initiated  ????????????????????????????????????????????Current Status: tolerated 30 min with minimal increase in s/sx. 01/23/16??  Long Term Goals:??To be accomplished in 10??treatments:   1. ??Pt will report 50% improvement or better with involvement to show a significant increase in function  ????????????????????????????????????????????Eval Status: ??Initiated  ????????????????????????????????????????????Current Status: n/a  2. 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  ????????????????????????????????????????????Eval Status: ??Initiated  ????????????????????????????????????????????Current Status: 4   02/05/17  3. ??Pt will have improved upright posturing to -10* or better to show improved postural alignment and increased core stability to aid with walking tolerance ????  ????????????????????????????????????????????Eval Status: ??Initiated  ????????????????????????????????????????????Current Status: n/a  4. ??Pt will negotiate 1 flight of steps x 2 Reps to transition up and down her stairs at home  ????????????????????????????????????????????Eval Status: ??Difficulty with negotiating stairs  ????????????????????????????????????????????Current Status: n/a  5. ??Pt will improve FOTO score to 56 in 12 visits??to show significant improvement in function for progress to little difficulty with usual daily work tasks  ????????????????????????????????????????????Eval Status: 47  ????????????????????????????????????????????Current Status:47   01/27/17    PLAN  []   Upgrade activities as tolerated     []   Continue plan of care  []   Update interventions per flow sheet       []   Discharge due to:_  [x]   Other:_  MD  Note  NV    Aubriella Perezgarcia, PTA 02/05/2017  5:59 PM    Future Appointments  Date Time Provider [place]   02/18/2017 5:30 PM Willaim Rayas, PTA [facility]   02/22/2017 5:00 PM Stephanie Coup, PT [facility]   02/25/2017 5:30 PM Isamar Wellbrock, PTA [facility]   03/01/2017 4:30 PM Tawana Pasch, PTA [facility]   03/04/2017 5:30 PM  Willaim Rayas, [place] [facility]   08/02/2017 9:45 AM Carlyle Dolly, MD [place]

## 2017-02-13 ENCOUNTER — Emergency Department: Admit: 2017-02-13 | Payer: PRIVATE HEALTH INSURANCE | Primary: Internal Medicine

## 2017-02-13 ENCOUNTER — Inpatient Hospital Stay
Admit: 2017-02-13 | Discharge: 2017-02-14 | Disposition: A | Discharge status: Home / Self Care | Payer: PRIVATE HEALTH INSURANCE | Attending: Emergency Medicine

## 2017-02-13 DIAGNOSIS — R0789 Other chest pain: Secondary | ICD-10-CM

## 2017-02-13 LAB — POC TROPONIN: Troponin-I: 0 ng/ml (ref 0.00–0.07)

## 2017-02-13 MED ORDER — SODIUM CHLORIDE 0.9 % IJ SYRG
Freq: Once | INTRAMUSCULAR | Status: DC
Start: 2017-02-13 — End: 2017-02-13

## 2017-02-13 NOTE — ED Provider Notes (Signed)
Elaine [name]  Emergency Department Treatment Report        Patient: Elaine [name] Age: 66 y.o. Sex: female    Date of Birth: [birth year] Admit Date: 02/13/2017 PCP: Osie Cheeks, DO   MRN: [id number]  CSN: [id number]     Room: ER33/ER33 Time Dictated: 5:07 PM      Attending MD: Maylon Cos, MD  APC:  Maryanna Shape PA-C    Chief Complaint   Chest pain, shortness of breath    History of Present Illness   66 y.o. female presents for evaluation of chest pain for the past 3-4 days.  Has a productive cough of white mucous.  States the only time her chest hurts is if she coughs over her sternum.  No radiation of pain.  Some chills, no fevers, no bodyaches.      Has had some shortness of breath on exertion for a few months.  Saw PCP who sent her to cardiology recently about her breathing.  She was told all her testing was normal except a wall of her heart was thickened from long standing hypertension and age.    Review of Systems   Review of Systems   Constitutional: Positive for chills. Negative for fever.   HENT: Negative for congestion.    Eyes: Negative for discharge.   Respiratory: Positive for cough, sputum production and shortness of breath. Negative for wheezing.    Cardiovascular: Positive for chest pain. Negative for palpitations and leg swelling.   Gastrointestinal: Negative for abdominal pain, nausea and vomiting.   Genitourinary: Negative.    Musculoskeletal: Negative for back pain and myalgias.   Skin: Negative for rash.   Neurological: Negative for dizziness and headaches.   Endo/Heme/Allergies: Negative for environmental allergies.   Psychiatric/Behavioral: The patient is not nervous/anxious.        Past Medical/Surgical History     Past Medical History:   Diagnosis Date   ??? Anemia    ??? Asthma    ??? Colon polyp    ??? DDD (degenerative disc disease) 02/15/2009   ??? Depression 12/18/2011   ??? Essential hypertension, benign    ??? History of echocardiogram 03/20/2004     EF 70%.  Borderline DDfx.  No significant valvular pathology.   ??? Hypercholesterolemia    ??? Hypertension    ??? Iron deficiency anemia 1/51/7616   ??? 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 Lower extremity venous duplex 10/23/2009    Left leg:  No DVT.     ??? Meralgia paraesthetica 10/22/2013   ??? OA (osteoarthritis of spine) 02/15/2009   ??? OA (osteoarthritis of spine) 02/15/2009   ??? Obesity, unspecified    ??? Other and unspecified hyperlipidemia    ??? Pre-operative cardiovascular examination     For spine surgery   ??? Right sided sciatica    ??? Shortness of breath     Possible asthma, HCVD; less likely CAD (Noted 03/15/09)   ??? Sleep apnea 02/15/2009    uses cpap machine   ??? Thallium stress test  03/20/2004    Partially transient, mod basal & mid anterior defect most c/w artifact; mild anterior ischemia less likely.  Neg EKG on pharm stress test.   ??? Thyroid disease     hypothyroidism     Past Surgical History:   Procedure Laterality Date   ??? HX COLONOSCOPY  02-24-12    normal (Dr. Angelique Holm)   ??? HX HERNIA REPAIR      3x   ???  HX HYSTERECTOMY     ??? HX TUBAL LIGATION     ??? NEUROLOGICAL PROCEDURE UNLISTED  03-16-2009    s/p ACD & Fusion (Dr.P.Gurtner)   ??? PR COLONOSCOPY FLX DX W/COLLJ SPEC WHEN PFRMD  02-2007    +polyp(tubular adenoma); Dr Angelique Holm       Social History     Social History     Social History   ??? Marital status: MARRIED     Spouse name: N/A   ??? Number of children: N/A   ??? Years of education: N/A     Occupational History   ??? Not on file.     Social History Main Topics   ??? Smoking status: Former Smoker     Years: 28.00     Quit date: 04/25/2004   ??? Smokeless tobacco: Never Used   ??? Alcohol use No   ??? Drug use: No   ??? Sexual activity: Not on file     Other Topics Concern   ??? Not on file     Social History Narrative       Family History     Family History   Problem Relation Age of Onset   ??? Cancer Mother    ??? Hypertension Mother    ??? Hypertension Sister      x3   ??? Heart Surgery Sister    ??? Heart Disease Sister    ??? Heart Attack Sister     ??? Cancer Maternal Aunt      breast   ??? Breast Cancer Maternal Aunt    ??? Glaucoma Son    ??? Heart defect Son    ??? Diabetes Paternal Uncle    ??? Hypertension Paternal Uncle        Current Medications     Prior to Admission Medications   Prescriptions Last Dose Informant Patient Reported? Taking?   Cholecalciferol, Vitamin D3, (VITAMIN D-3) 5,000 unit Tab   Yes No   Sig: Take  by mouth daily.   VARICELLA-ZOSTER VACINE LIVE 19,400 unit/0.65 mL susr injection   Yes No   atorvastatin (LIPITOR) 40 mg tablet   No No   Sig: TAKE 1 TABLET BY MOUTH DAILY   budesonide-formoterol (SYMBICORT) 160-4.5 mcg/actuation HFA inhaler   No No   Sig: Take 2 Puffs by inhalation two (2) times a day.   carvedilol (COREG) 6.25 mg tablet   No No   Sig: Take 1 Tab by mouth two (2) times daily (with meals).   gabapentin (NEURONTIN) 300 mg capsule   No No   Sig: take 1 capsule by mouth every morning and 2 capsules by mouth at bedtime   levothyroxine (SYNTHROID) 150 mcg tablet   No No   Sig: take 1 tablet by mouth once daily BEFORE BREAKFAST   montelukast (SINGULAIR) 10 mg tablet   No No   Sig: take 1 tablet by mouth at bedtime if needed   multivitamin (ONE A DAY) tablet   Yes No   Sig: Take 1 Tab by mouth daily.   omeprazole (PRILOSEC) 40 mg capsule   No No   Sig: TAKE 1 CAPSULE BY MOUTH ONCE A DAY BEFORE BREAKFAST   valsartan-hydroCHLOROthiazide (DIOVAN-HCT) 160-25 mg per tablet   No No   Sig: take 1 tablet by mouth daily      Facility-Administered Medications: None       Allergies     Allergies   Allergen Reactions   ??? Asa-Acetaminophen-Caff-Potass Other (comments)     Bleeding  in stomach   ??? Benadryl [Diphenhydramine Hcl] Other (comments)     Muscle jerking   ??? Other Plant, Animal, Environmental Not Reported This Time     Grass, Dust and Mold   ??? Pollen Extracts Not Reported This Time       Physical Exam   ED Triage Vitals   ED Encounter Vitals Group      BP 02/13/17 1648 130/54      Pulse (Heart Rate) 02/13/17 1648 76       Resp Rate 02/13/17 1648 18      Temp 02/13/17 1648 98.6 ??F (37 ??C)      Temp src --       O2 Sat (%) 02/13/17 1648 100 %      Weight 02/13/17 1648 195 lb      Height 02/13/17 1648 5\' 2"      Physical Exam   Constitutional: She is oriented to person, place, and time and well-developed, well-nourished, and in no distress.   HENT:   Head: Normocephalic and atraumatic.   Right Ear: Tympanic membrane normal.   Left Ear: Tympanic membrane normal.   Nose: Nose normal.   Mouth/Throat: Oropharynx is clear and moist.   Eyes: Conjunctivae and EOM are normal. No scleral icterus.   Neck: Normal range of motion. Neck supple. No tracheal deviation present.   Cardiovascular: Normal rate, regular rhythm, normal heart sounds and intact distal pulses.    No murmur heard.  Pulmonary/Chest: Effort normal and breath sounds normal. No stridor. No respiratory distress. She has no wheezes. She has no rales. She exhibits tenderness (lower sternum).   Abdominal: Soft. Bowel sounds are normal. There is no tenderness.   Musculoskeletal: She exhibits no edema or tenderness (calves are soft and non-tender).   Lymphadenopathy:     She has no cervical adenopathy.   Neurological: She is alert and oriented to person, place, and time. Gait normal. Coordination normal. GCS score is 15.   Skin: Skin is warm and dry.   Psychiatric: Affect normal.        Impression and Management Plan   Patient presents with several days of sternal chest pain with cough only.  Responds used on palpation.  This is most likely costochondritis or chest wall strain from the cough.  This is atypical for ACS and I don't think this is a PE.  I will get a chest x-ray to rule out infiltrates.  Recheck potassium that was low on [facility] chemistry.  Send troponin.    Diagnostic Studies   Lab:   Recent Results (from the past 12 hour(s))   METABOLIC PANEL, BASIC    Collection Time: 02/13/17  6:21 PM   Result Value Ref Range    Sodium 138 136 - 145 mEq/L     Potassium 3.0 (L) 3.5 - 5.1 mEq/L    Chloride 97 (L) 98 - 107 mEq/L    CO2 34 (H) 21 - 32 mEq/L    Glucose 119 (H) 74 - 106 mg/dl    BUN 18 7 - 25 mg/dl    Creatinine 0.9 0.6 - 1.3 mg/dl    GFR est AA >60.0      GFR est non-AA >60      Calcium 9.1 8.5 - 10.1 mg/dl   POC TROPONIN-I    Collection Time: 02/13/17  6:27 PM   Result Value Ref Range    Troponin-I 0.00 0.00 - 0.07 ng/ml     Labs Reviewed   METABOLIC PANEL,  BASIC - Abnormal; Notable for the following:        Result Value    Potassium 3.0 (*)     Chloride 97 (*)     CO2 34 (*)     Glucose 119 (*)     All other components within normal limits   POC TROPONIN-I   POC TROPONIN       EKG Interpretation:  Dr. Azzie Almas did not see any acute S-T segment or T-wave abnormalities that are consistent with acute ischemia or infarction.  T-wave inversions in V1, V2, V3 seen, unchanged from [facility], unchanged from EKG here July 2017.    Imaging:    Xr Chest Pa Lat    Result Date: 02/13/2017  Clinical history: Cough, chest pain EXAMINATION: PA and lateral views of the chest 02/13/2017 Correlation: 07/02/2016 FINDINGS: Anterior cervical. There is cardiomegaly. Trachea is midline. Lungs are clear. Degenerative changes of the spine.     IMPRESSION: Cardiomegaly.       Results from [facility]:  Patient had a CBC showing a white blood cell count 3.8, hemoglobin 10.8, hematocrit 33.1.  Platelets normal at 212.  Mildly elevated monocytes with normal segs and lymphs.    In-house chemistry showed a potassium of 3.1, glucose 112, creatinine 1.0, sodium 137, BUN 19    EKG there showed normal sinus rhythm, rate 76, no acute ischemic changes, T-wave inversions seen in V1, V2, V3.    ED Course   Patient was maintained on cardiac, oxygen, and blood pressure monitoring without arrhythmia or event.    Patient Vitals for the past 12 hrs:   Temp Pulse Resp BP SpO2   02/13/17 1648 98.6 ??F (37 ??C) 76 18 130/54 100 %     Impression NST January 2018:  Findings:   1. Post-stress imaging in short axis, horizontal and vertical long axis views reveals a small area of mild defect in the inferobasal wall. ??  2. Resting images also show persistent fixed defect in the inferobasal wall. ??  3. Gated images show normal left ventricular size, wall motion and systolic function. ??The ejection fraction was 75%.   ??  Diagnosis:   1. Probably normal scan. ??  2. Evidence of a small mild fixed inferior wall defect noted from his nuclear study suggestive of tissue attenuation with normal wall motion in the area. ??  3. No reversible defect suggesting ischemia noted from his nuclear study.   4. Normal left ventricular size and systolic function.   5. Low risk scan.      SUMMARY 2D-Echo from January 2018:  Left ventricle: Systolic function was hyperdynamic. Ejection fraction was  estimated in the range of 70 % to 75 %. There were no regional wall motion  abnormalities. There was mild concentric hypertrophy. Doppler parameters  were consistent with abnormal left ventricular relaxation (grade 1  diastolic dysfunction).    Mitral valve: There was mild annular calcification.    COMPARISONS:  There has been no significant change. Comparison was made with the  previous study of 15-Mar-2009.    ED Course   Value Comment By Time    Discussed the patient's history, physical and planned work-up with Dr. Azzie Almas who is in agreement.  We reviewed her old medical records together including recent cardiac testing. Jenelle Mages, PA-C 03/10 1736    Patient has been seen by Dr. Emeterio Reeve- patient is consistent that her pain is only with cough.  We reviewed her CXR that shows cardiomegaly, unchanged from  10/2016- no evidence of acute failure. Jenelle Mages, PA-C 03/10 1821   Troponin-I: 0.00 (Reviewed) Jenelle Mages, PA-C 03/10 1840    I discussed test results with the patient.  She is aware her potassium is low.  She is given 53 MEQ here orally prior to discharge.  She will  follow-up with her primary care doctor to have it rechecked.  She'll take Robitussin over-the-counter, Tessalon written if she has cough not improved with that. Jenelle Mages, PA-C 03/10 1933       Medications   sodium chloride (NS) flush 5-10 mL (not administered)   potassium chloride (K-DUR, KLOR-CON) SR tablet 40 mEq (not administered)       Medical Decision Making   Patient presenting with sternal chest pain happens only with cough and is reproducible palpation.  She has no pain to take a deep breath, has chronic shortness of breath it's not worse since her cough started.  I don't think this is a PE.  This is atypical for ACS.  She's had recent cardiac testing to include an NST and 2-D echo that did not show wall motion problems or ischemia and EF was 70%.  Her troponin is negative, EKG unchanged from last year.  Chest x-ray is clear showing no infiltrates.  I think she is safe for discharge home to f/u with PCP.  She plans to take Robitussin, will prescribe tessalon if that isn't helping.  She will have her K+ recheck with PCP- discussed this could be from her [facility]-HCT.    Final Diagnosis       ICD-10-CM ICD-9-CM   1. Chest wall pain R07.89 786.52   2. Cough R05 786.2   3. Hypokalemia E87.6 276.8       Disposition     Follow-up Information     Follow up With Details Comments [place], DO In 1 week For follow-up of potassium, chest pain, cough [address] [address]  [phone]      [facility] EMERGENCY DEPT Go to If symptoms worsen- increased trouble breathing, fever, worsening/change in pain [facility]        Prescription given for Tessalon    The patient is discharged home in stable condition, with instructions to follow-up with their regular doctor.  They are advised to return immediately for any worsening or symptoms of concern.    The patient was personally evaluated by myself and SARA Darrol Poke, MD  who agrees with the above assessment and plan.    Hezekiah Veltre E. Gustavus Messing  February 13, 2017    My signature above authenticates this document and my orders, the final ??  diagnosis (es), discharge prescription (s), and instructions in the Epic ??  record.  If you have any questions please contact [phone].  ??  Nursing notes have been reviewed by the physician/ advanced practice ??  Clinician.    Dragon medical dictation software was used for portions of this report. Unintended voice recognition errors may occur.

## 2017-02-13 NOTE — ED Notes (Signed)
Pt states chest only hurts when she coughs

## 2017-02-13 NOTE — ED Notes (Signed)
Discharge instructions reviewed with patient. Prescription for Tessalon perles was also reviewed with patient. Patient verbalized understanding. Opportunity for questions and clarifications  was provided. Patient discharged to home. Patient accompanied by family member.

## 2017-02-13 NOTE — ED Triage Notes (Addendum)
Pt states she has had CP X 3 days. Pt was sent by [facility]. Pt also reports SOB on exertion and an occasional cough. EKG done in triage. p

## 2017-02-14 LAB — EKG 12-LEAD
Atrial Rate: 75 {beats}/min
Diagnosis: NORMAL
P Axis: 73 degrees
P-R Interval: 194 ms
Q-T Interval: 408 ms
QRS Duration: 88 ms
QTc Calculation (Bazett): 455 ms
R Axis: 52 degrees
T Axis: 46 degrees
Ventricular Rate: 75 {beats}/min

## 2017-02-14 LAB — METABOLIC PANEL, BASIC
BUN: 18 mg/dl (ref 7–25)
CO2: 34 mEq/L — ABNORMAL HIGH (ref 21–32)
Calcium: 9.1 mg/dl (ref 8.5–10.1)
Chloride: 97 mEq/L — ABNORMAL LOW (ref 98–107)
Creatinine: 0.9 mg/dl (ref 0.6–1.3)
GFR est AA: >60
GFR est non-AA: >60
Glucose: 119 mg/dl — ABNORMAL HIGH (ref 74–106)
Potassium: 3 mEq/L — ABNORMAL LOW (ref 3.5–5.1)
Sodium: 138 mEq/L (ref 136–145)

## 2017-02-14 LAB — EKG, 12 LEAD, INITIAL
Atrial Rate: 75 {beats}/min
Calculated P Axis: 73 degrees
Calculated R Axis: 52 degrees
Calculated T Axis: 46 degrees
Diagnosis: NORMAL
P-R Interval: 194 ms
Q-T Interval: 408 ms
QRS Duration: 88 ms
QTC Calculation (Bezet): 455 ms
Ventricular Rate: 75 {beats}/min

## 2017-02-14 MED ORDER — POTASSIUM CHLORIDE SR 20 MEQ TAB, PARTICLES/CRYSTALS
20 mEq | ORAL | Status: AC
Start: 2017-02-14 — End: 2017-02-13
  Administered 2017-02-14: 01:00:00 via ORAL

## 2017-02-14 MED ORDER — BENZONATATE 200 MG CAP
200 mg | ORAL_CAPSULE | Freq: Three times a day (TID) | ORAL | 0 refills | Status: AC | PRN
Start: 2017-02-14 — End: 2017-02-20

## 2017-02-14 MED FILL — POTASSIUM CHLORIDE SR 20 MEQ TAB, PARTICLES/CRYSTALS: 20 mEq | ORAL | Qty: 2

## 2017-02-18 ENCOUNTER — Inpatient Hospital Stay: Admit: 2017-02-18 | Payer: PRIVATE HEALTH INSURANCE | Primary: Internal Medicine

## 2017-02-18 NOTE — Progress Notes (Signed)
PT DAILY TREATMENT NOTE 12-16    Patient Name: Elaine [name]  Date:02/18/2017  DOB: 03-02-1951  [x]   Patient DOB Verified  Payor: [employer] / Plan: [place] HMO / Product Type: HMO /    In time:5:37   Out time:6:18  Total Treatment Time (min): 43  Visit #: 7 of 10    Treatment Area: Low back pain [M54.5]    SUBJECTIVE  Pain Level (0-10 scale): 0  Any medication changes, allergies to medications, adverse drug reactions, diagnosis change, or new procedure performed?: [x]  No    []  Yes (see summary sheet for update)  Subjective functional status/changes:   []  No changes reported  No pain.    OBJECTIVE    Modality rationale: decrease edema, decrease inflammation, decrease pain and increase tissue extensibility to improve the patient???s ability to perform ADL    Min Type Additional Details    []  Estim:  [] Unatt       [] IFC  [] Premod                        [] Other:  [] w/ice   [] w/heat  Position:  Location:    []  Estim: [] Att    [] TENS instruct  [] NMES                    [] Other:  [] w/US   [] w/ice   [] w/heat  Position:  Location:    []   Traction: []  Cervical       [] Lumbar                       []  Prone          [] Supine                       [] Intermittent   [] Continuous Lbs:  []  before manual  []  after manual    []   Ultrasound: [] Continuous   []  Pulsed                           [] 1MHz   [] 3MHz W/cm2:  Location:    []   Iontophoresis with dexamethasone         Location: []  Take home patch   []  In clinic    []   Ice     []   heat  []   Ice massage  []   Laser   []   Anodyne Position:  Location:    []   Laser with stim  []   Other:  Position:  Location:    []   Vasopneumatic Device Pressure:       []  lo []  med []  hi   Temperature: []  lo []  med []  hi   [x]  Skin assessment post-treatment:  [x] intact [] redness- no adverse reaction    [] redness ??? adverse reaction:      min [] Eval                  [] Re-Eval       43 min Therapeutic Exercise:  [x]  See flow sheet :    Rationale: increase ROM and increase strength to improve the patient???s ability to perform ADL     min Therapeutic Activity:  []   See flow sheet :   Rationale:   to improve the patient???s ability to       min Neuromuscular Re-education:  []   See flow sheet :   Rationale:   to  improve the patient???s ability to    min Manual Therapy:     Rationale: decrease pain, increase ROM, increase tissue extensibility and decrease edema  to perform ADL      min Gait Training:  ___ feet with ___ device on level surfaces with ___ level of assist   Rationale:          With   [x]  TE   []  TA   []  neuro   []  other: Patient Education: [x]  Review HEP    []  Progressed/Changed HEP based on:   []  positioning   []  body mechanics   []  transfers   []  heat/ice application    []  other:      Other Objective/Functional Measures:  FOTO: 52    Pain Level (0-10 scale) post treatment: 0    ASSESSMENT/Changes in Function: Elaine [name]  reports   25%  Improvement.  Pain level  On average is  4-6/10.  Also reports  Can't  Walk  For  Long.  Able  To stand  Upright.    Patient will continue to benefit from skilled PT services to address functional mobility deficits, address ROM deficits, address strength deficits, analyze and address soft tissue restrictions, analyze and cue movement patterns and instruct in home and community integration to attain remaining goals.     [x]   See Plan of Care  []   See progress note/recertification  []   See Discharge Summary         Progress towards goals / Updated goals:  Short Term Goals:??To be accomplished in 5??treatments:  1. ??Pt will be compliant and independent with HEP in order to facilitate PT sessions and aid with self management  ????????????????????????????????????????????Eval Status: ??Initiated  ????????????????????????????????????????????Current Status: compliant. ??01/22/17  2. ??Pt to tolerate 30 min or more of TE and/or Interventions w/o increased s/s  ????????????????????????????????????????????Eval Status: ??Initiated   ????????????????????????????????????????????Current Status: tolerated 30 min with minimal increase in s/sx. 01/23/16??  Long Term Goals:??To be accomplished in 10??treatments:  1. ??Pt will report 50% improvement or better with involvement to show a significant increase in function  ????????????????????????????????????????????Eval Status: ??Initiated  ????????????????????????????????????????????Current Status: 25%  02/18/17  2. 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  ????????????????????????????????????????????Eval Status: ??Initiated  ????????????????????????????????????????????Current Status: 4   02/05/17  3. ??Pt will have improved upright posturing to -10* or better to show improved postural alignment and increased core stability to aid with walking tolerance ????  ????????????????????????????????????????????Eval Status: ??Initiated  ????????????????????????????????????????????Current Status: able  To stand  Upright.  02/18/17  4. ??Pt will negotiate 1 flight of steps x 2 Reps to transition up and down her stairs at home  ????????????????????????????????????????????Eval Status: ??Difficulty with negotiating stairs  ????????????????????????????????????????????Current Status: n/a  5. ??Pt will improve FOTO score to 56 in 12 visits??to show significant improvement in function for progress to little difficulty with usual daily work tasks  ????????????????????????????????????????????Eval Status: 47  ????????????????????????????????????????????Current Status: 52????02/18/17    PLAN  []   Upgrade activities as tolerated     []   Continue plan of care  []   Update interventions per flow sheet       []   Discharge due to:_  [x]   Other:_FAX  MD NOTE    Willaim Rayas, PTA 02/18/2017  5:41 PM    Future Appointments  Date Time Provider [place]   02/19/2017 5:00 PM [place], DO [place]   02/22/2017 5:00 PM Stephanie Coup, PT [facility]  02/25/2017 5:30 PM Jazzmen Restivo, PTA [facility]   03/01/2017 4:30 PM Willaim Rayas, PTA [facility]   03/04/2017 5:30 PM Willaim Rayas, PTA [facility]   08/02/2017 9:45 AM Carlyle Dolly, MD [place]

## 2017-02-19 ENCOUNTER — Encounter: Attending: Internal Medicine | Primary: Internal Medicine

## 2017-02-22 ENCOUNTER — Inpatient Hospital Stay: Admit: 2017-02-22 | Payer: PRIVATE HEALTH INSURANCE | Primary: Internal Medicine

## 2017-02-22 NOTE — Progress Notes (Signed)
[facility] ??? 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 Pt continues to have pain with prolonged standing/walking.     Current goals:   Short Term Goals:??To be accomplished in 5??treatments:  1. ??Pt will be compliant and independent with HEP in order to facilitate PT sessions and aid with self management  ????????????????????????????????????????????Eval Status: ??Initiated  ????????????????????????????????????????????Current Status: MET, compliant. ??02/22/17  2. ??Pt to tolerate 30 min or more of TE and/or Interventions w/o increased s/s  ????????????????????????????????????????????Eval Status: ??Initiated  ????????????????????????????????????????????Current Status: MET 02/23/16??  Long Term Goals:??To be accomplished in 10??treatments:  1. ??Pt will report 50% improvement or better with involvement to show a significant increase in function  ????????????????????????????????????????????Eval Status: ??Initiated  ????????????????????????????????????????????Current Status: progressing, 35% improvement 02/22/2017  2. 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  ????????????????????????????????????????????Eval Status: ??Initiated   ????????????????????????????????????????????Current Status: MET, 0/10 at best 02/22/2017  3. ??Pt will have improved upright posturing to -10* or better to show improved postural alignment and increased core stability to aid with walking tolerance ????  ????????????????????????????????????????????Eval Status: ??Initiated  ????????????????????????????????????????????Current Status:  MET 15 min standing tolerance on a good day 02/22/2017  4. ??Pt will negotiate 1 flight of steps x 2 Reps to transition up and down her stairs at home  ????????????????????????????????????????????Eval Status: ??Difficulty with negotiating stairs  ????????????????????????????????????????????Current Status: progressing, able to negotiate 1 flight at this time 02/22/2017  5. ??Pt will improve FOTO score to 56 in 12 visits??to show significant improvement in function for progress to little difficulty with usual daily work tasks  ????????????????????????????????????????????Eval Status: 47  ????????????????????????????????????????????Current Status: progressing but not met; 52 points on??02/18/17    Key functional changes:   Functional Gains: some improvement in standing tolerance (15 min tolerance "on a good day"), improved walking tolerance, mobility with stairs  Functional Deficits: cannot go to store without using a cart, some improvement with pain, prolonged standing  % improvement: 35%  Pain                   Best: 0/10                          Worst: 8/10 "standing"      Problems/ barriers to goal attainment: attendance to therapy sessions     Assessment / Recommendations:  Pt demonstrated/reported improvements  in standing/walking tolerance and pain since starting therapy. Pt continues to have difficulty with prolonged static standing and walking. Educated pt on importance of attending therapy interventions to help with her pain and symptoms. We will plan on continuing therapy at this time to help the pt perform functional tasks with more ease and improve standing tolerance.     Problem List: pain affecting function, decrease ROM, decrease strength,  impaired gait/ balance, decrease ADL/ functional abilitiies, decrease activity tolerance, decrease flexibility/ joint mobility and decrease transfer abilities   Treatment Plan: Therapeutic exercise, Therapeutic activities, Neuromuscular re-education, Physical agent/modality, Gait/balance training, Manual therapy, Patient education, Self Care training, Functional mobility training and Home safety training    Patient Goal (s) has been updated and includes: walk and stand longer     Updated Goals to be accomplished in 8 treatments:  1. ??Pt will report 50% improvement or better with involvement to show a significant increase in function  PN: 35% improvement  2. ??Pt will report having a decrease in at worst pain to 5/10 to improve ease of mobility.  PN: 8/10 at worst  3. ??Pt will report an increase in walking/standing tolerance to at least 25 mins to improve mobility in the grocery store.   PN: 15 min standing tolerance on a good day   4. ??Pt will negotiate 1 flight of steps x 2 Reps to transition up and down her stairs at home  PN: able to negotiate 1 flight at this time  5. ??Pt will improve FOTO score to 56 in 12 visits??to show significant improvement in function for progress to little difficulty with usual daily work tasks  PN:52 points     Frequency / Duration: Patient to be seen 2-3 times per week for 68 treatments:    G-Codes (GP)  Mobility  [id number] Current  CK= 40-59%  [id number] Goal  CK= 40-59%     The severity rating is based on clinical judgment and the FOTO score.    Stephanie Coup, PT 02/22/2017 5:24 PM    NOTE TO PHYSICIAN:  PLEASE COMPLETE THE ORDERS BELOW AND   FAX TO [facility]: [phone]  If you are unable to process this request in 24 hours please contact our office: [phone]    '[]'$   I have read the above report and request that my patient continue as recommended.  '[]'$   I have read the above report and request that my patient continue therapy with the following changes/special  instructions:________________________________________  '[]'$ I have read the above report and request that my patient be discharged from therapy.    Physician???s signature: ______________________________Date: _____Time:______

## 2017-02-22 NOTE — Progress Notes (Addendum)
PT DAILY TREATMENT NOTE 12-16    Patient Name: Elaine [name]  Date:02/22/2017  DOB: 1951-07-10  '[x]'$   Patient DOB Verified  Payor: CIG[employer] / Plan: [place] H[employer] / Product Type: HMO /    In time: 5:05    Out time: 5:51  Total Treatment Time (min): 46  Visit #: 8 of 10    Treatment Area: Low back pain [M54.5]    SUBJECTIVE  Pain Level (0-10 scale): 3  Any medication changes, allergies to medications, adverse drug reactions, diagnosis change, or new procedure performed?: '[x]'$  No    '[]'$  Yes (see summary sheet for update)  Subjective functional status/changes:   '[]'$  No changes reported  Reports having some pain today.     OBJECTIVE    36 min Therapeutic Exercise:  '[x]'$  See flow sheet : exercises and goal reassessment   Rationale: increase ROM and increase strength to improve the patient???s ability to perform ADL    10 min Neuromuscular Re-education:  '[x]'$   See flow sheet : OOV exercises   Rationale: increase strength, improve coordination and increase proprioception  to improve the patient???s ability to tolerate functional tasks.           With   '[x]'$  TE   '[]'$  TA   '[]'$  neuro   '[]'$  other: Patient Education: '[x]'$  Review HEP    '[]'$  Progressed/Changed HEP based on:   '[]'$  positioning   '[]'$  body mechanics   '[]'$  transfers   '[]'$  heat/ice application    '[]'$  other:      Other Objective/Functional Measures: See goals below. Added s/l hip ABD B to improve strength and mobility in the LEs. Mild instability noted with OOV exercises today.      Functional Gains: some improvement in standing tolerance (15 min tolerance "on a good day"), improved walking tolerance, mobility with stairs  Functional Deficits: cannot go to store without using a cart, some improvement with pain, prolonged standing  % improvement: 35%  Pain        Best: 0/10     Worst: 8/10 "standing"    Pain Level (0-10 scale) post treatment: 0    ASSESSMENT/Changes in Function: See PN. Pt demonstrated/reported improvements in standing/walking tolerance and pain since starting  therapy. Pt continues to have difficulty with prolonged static standing and walking. Educated pt on importance of attending therapy interventions to help with her pain and symptoms. We will plan on continuing therapy at this time to help the pt perform functional tasks with more ease and improve standing tolerance.     Patient will continue to benefit from skilled PT services to modify and progress therapeutic interventions, address functional mobility deficits, address ROM deficits, address strength deficits, analyze and address soft tissue restrictions, analyze and cue movement patterns, analyze and modify body mechanics/ergonomics, assess and modify postural abnormalities and instruct in home and community integration to attain remaining goals.     '[]'$   See Plan of Care  '[x]'$   See progress note/recertification  '[]'$   See Discharge Summary         Progress towards goals / Updated goals:  Short Term Goals:??To be accomplished in 5??treatments:  1. ??Pt will be compliant and independent with HEP in order to facilitate PT sessions and aid with self management  ????????????????????????????????????????????Eval Status: ??Initiated  ????????????????????????????????????????????Current Status: MET, compliant. ??02/22/17  2. ??Pt to tolerate 30 min or more of TE and/or Interventions w/o increased s/s  ????????????????????????????????????????????Eval Status: ??Initiated  ????????????????????????????????????????????Current Status: MET 02/23/16??  Long Term  Goals:??To be accomplished in 10??treatments:  1. ??Pt will report 50% improvement or better with involvement to show a significant increase in function  ????????????????????????????????????????????Eval Status: ??Initiated  ????????????????????????????????????????????Current Status: progressing, 35% improvement 02/22/2017  2. 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  ????????????????????????????????????????????Eval Status: ??Initiated  ????????????????????????????????????????????Current Status: MET, 0/10 at best 02/22/2017  3. ??Pt will have improved upright posturing to -10* or better to show  improved postural alignment and increased core stability to aid with walking tolerance ????  ????????????????????????????????????????????Eval Status: ??Initiated  ????????????????????????????????????????????Current Status:  MET 15 min standing tolerance on a good day 02/22/2017  4. ??Pt will negotiate 1 flight of steps x 2 Reps to transition up and down her stairs at home  ????????????????????????????????????????????Eval Status: ??Difficulty with negotiating stairs  ????????????????????????????????????????????Current Status: progressing, able to negotiate 1 flight at this time 02/22/2017  5. ??Pt will improve FOTO score to 56 in 12 visits??to show significant improvement in function for progress to little difficulty with usual daily work tasks  ????????????????????????????????????????????Eval Status: 47  ????????????????????????????????????????????Current Status: progressing but not met; 52 points on??02/18/17    PLAN  '[x]'$   Upgrade activities as tolerated     '[x]'$   Continue plan of care  '[x]'$   Update interventions per flow sheet       '[]'$   Discharge due to:_  '[]'$   Other:_    Stephanie Coup, PT 02/22/2017  5:29 PM    Future Appointments  Date Time Provider [place]   02/22/2017 5:00 PM Stephanie Coup, PT [facility]   02/25/2017 5:30 PM Willaim Rayas, PTA [facility]   03/01/2017 4:30 PM Willaim Rayas, PTA [facility]   03/04/2017 5:30 PM Willaim Rayas, PTA [facility]   03/05/2017 4:30 PM [place], DO [facility] SCHED   08/02/2017 9:45 AM Carlyle Dolly, MD [place]

## 2017-02-24 MED ORDER — GABAPENTIN 300 MG CAP
300 mg | ORAL_CAPSULE | ORAL | 1 refills | Status: DC
Start: 2017-02-24 — End: 2017-06-08

## 2017-02-24 NOTE — Telephone Encounter (Signed)
Gabapentin refilled and sent to pharmacy. Please call and notify pt.

## 2017-02-24 NOTE — Telephone Encounter (Signed)
Left name and call back nunber

## 2017-02-25 ENCOUNTER — Inpatient Hospital Stay: Admit: 2017-02-25 | Payer: PRIVATE HEALTH INSURANCE | Primary: Internal Medicine

## 2017-02-25 NOTE — Progress Notes (Signed)
PT DAILY TREATMENT NOTE 12-16    Patient Name: Elaine [name]  Date:02/25/2017  DOB: [birth year]  '[x]'$   Patient DOB Verified  Payor: CIGNA / Plan: [place] [employer] / Product Type: HMO /    In time:5:37 Out time:6:18  Total Treatment Time (min): 34  Visit #: 9 of 10    Treatment Area: Low back pain [M54.5]    SUBJECTIVE  Pain Level (0-10 scale): 3  Any medication changes, allergies to medications, adverse drug reactions, diagnosis change, or new procedure performed?: '[x]'$  No    '[]'$  Yes (see summary sheet for update)  Subjective functional status/changes:   '[]'$  No changes reported  Feel  Like  Ive  Been  Beaten  Up.    OBJECTIVE    Modality rationale: decrease edema, decrease inflammation, decrease pain and increase tissue extensibility to improve the patient???s ability to perform ADL    Min Type Additional Details    '[]'$  Estim:  '[]'$ Unatt       '[]'$ IFC  '[]'$ Premod                        '[]'$ Other:  '[]'$ w/ice   '[]'$ w/heat  Position:  Location:    '[]'$  Estim: '[]'$ Att    '[]'$ TENS instruct  '[]'$ NMES                    '[]'$ Other:  '[]'$ w/US   '[]'$ w/ice   '[]'$ w/heat  Position:  Location:    '[]'$   Traction: '[]'$  Cervical       '[]'$ Lumbar                       '[]'$  Prone          '[]'$ Supine                       '[]'$ Intermittent   '[]'$ Continuous Lbs:  '[]'$  before manual  '[]'$  after manual   8 '[x]'$   Ultrasound: '[x]'$ Continuous   '[]'$  Pulsed                           '[x]'$ 1MHz   '[]'$ 3MHz W/cm2:1.3  Location:(B) LSP    '[]'$   Iontophoresis with dexamethasone         Location: '[]'$  Take home patch   '[]'$  In clinic    '[]'$   Ice     '[]'$   heat  '[]'$   Ice massage  '[]'$   Laser   '[]'$   Anodyne Position:  Location:    '[]'$   Laser with stim  '[]'$   Other:  Position:  Location:    '[]'$   Vasopneumatic Device Pressure:       '[]'$  lo '[]'$  med '[]'$  hi   Temperature: '[]'$  lo '[]'$  med '[]'$  hi   '[x]'$  Skin assessment post-treatment:  '[x]'$ intact '[]'$ redness- no adverse reaction    '[]'$ redness ??? adverse reaction:      min '[]'$ Eval                  '[]'$ Re-Eval       26 min Therapeutic Exercise:   See flow sheet :    Rationale: increase ROM and increase strength to improve the patient???s ability to perform ADL      min Therapeutic Activity:  '[]'$   See flow sheet :   Rationale:   to improve the patient???s ability to       min Neuromuscular Re-education:  '[]'$   See  flow sheet :   Rationale:   to improve the patient???s ability to      min Manual Therapy:     Rationale: decrease pain, increase ROM, increase tissue extensibility and decrease edema  to perform ADL      min Gait Training:  ___ feet with ___ device on level surfaces with ___ level of assist   Rationale:          With   '[x]'$  TE   '[]'$  TA   '[]'$  neuro   '[]'$  other: Patient Education: '[x]'$  Review HEP    '[]'$  Progressed/Changed HEP based on:   '[]'$  positioning   '[]'$  body mechanics   '[]'$  transfers   '[]'$  heat/ice application    '[]'$  other:      Other Objective/Functional Measures:  Fair  Response  To each there ex.    Pain Level (0-10 scale) post treatment: 0    ASSESSMENT/Changes in Function: Benefited  With treatment.    Patient will continue to benefit from skilled PT services to address functional mobility deficits, address ROM deficits, address strength deficits, analyze and address soft tissue restrictions, analyze and cue movement patterns and instruct in home and community integration to attain remaining goals.     '[x]'$   See Plan of Care  '[]'$   See progress note/recertification  '[]'$   See Discharge Summary         Progress towards goals / Updated goals:  Short Term Goals:??To be accomplished in 5??treatments:  1. ??Pt will be compliant and independent with HEP in order to facilitate PT sessions and aid with self management  ????????????????????????????????????????????Eval Status: ??Initiated  ????????????????????????????????????????????Current Status: MET, compliant. ??02/22/17  2. ??Pt to tolerate 30 min or more of TE and/or Interventions w/o increased s/s  ????????????????????????????????????????????Eval Status: ??Initiated  ????????????????????????????????????????????Current Status: MET 02/23/16??  Long Term Goals:??To be accomplished in 10??treatments:   1. ??Pt will report 50% improvement or better with involvement to show a significant increase in function  ????????????????????????????????????????????Eval Status: ??Initiated  ????????????????????????????????????????????Current Status: progressing, 35% improvement 02/22/2017  2. 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  ????????????????????????????????????????????Eval Status: ??Initiated  ????????????????????????????????????????????Current Status: MET, 0/10 at best 02/22/2017  3. ??Pt will have improved upright posturing to -10* or better to show improved postural alignment and increased core stability to aid with walking tolerance ????  ????????????????????????????????????????????Eval Status: ??Initiated  ????????????????????????????????????????????Current Status:  MET 15 min standing tolerance on a good day 02/22/2017  4. ??Pt will negotiate 1 flight of steps x 2 Reps to transition up and down her stairs at home  ????????????????????????????????????????????Eval Status: ??Difficulty with negotiating stairs  ????????????????????????????????????????????Current Status: progressing, able to negotiate 1 flight at this time 02/22/2017  5. ??Pt will improve FOTO score to 56 in 12 visits??to show significant improvement in function for progress to little difficulty with usual daily work tasks  ????????????????????????????????????????????Eval Status: 47  ????????????????????????????????????????????Current Status: progressing but not met; 52 points on??02/18/17  ??    PLAN  '[]'$   Upgrade activities as tolerated     '[x]'$   Continue plan of care  '[]'$   Update interventions per flow sheet       '[]'$   Discharge due to:_  '[]'$   Other:_      Willaim Rayas, PTA 02/25/2017  5:38 PM    Future Appointments  Date Time Provider [place]   03/01/2017 4:30 PM Willaim Rayas, [place] [facility]   03/04/2017 5:30 PM Willaim Rayas, PTA [facility]   03/05/2017 4:30 PM  Osie Cheeks, DO [facility] SCH[facility]   08/02/2017 9:45 AM Carlyle Dolly, MD [place]

## 2017-02-26 MED ORDER — MONTELUKAST 10 MG TAB
10 mg | ORAL_TABLET | ORAL | 1 refills | Status: DC
Start: 2017-02-26 — End: 2017-09-05

## 2017-02-26 NOTE — Telephone Encounter (Signed)
Singulair refilled and sent to pharmacy. Please call and notify pt.

## 2017-03-01 ENCOUNTER — Inpatient Hospital Stay: Admit: 2017-03-01 | Payer: PRIVATE HEALTH INSURANCE | Primary: Internal Medicine

## 2017-03-01 NOTE — Progress Notes (Signed)
PT DAILY TREATMENT NOTE 12-16    Patient Name: Elaine [name]  Date:03/01/2017  DOB: [birth year]  '[x]'$   Patient DOB Verified  Payor: CIGNA / Plan: [place] [employer] / Product Type: HMO /    In time:4:30  Out time:5:40  Total Treatment Time (min): 70  Visit #: 10 of 10    Treatment Area: Low back pain [M54.5]    SUBJECTIVE  Pain Level (0-10 scale): 3  Any medication changes, allergies to medications, adverse drug reactions, diagnosis change, or new procedure performed?: '[x]'$  No    '[]'$  Yes (see summary sheet for update)  Subjective functional status/changes:   '[]'$  No changes reported  Little pain.    OBJECTIVE    Modality rationale: decrease edema, decrease inflammation, decrease pain and increase tissue extensibility to improve the patient???s ability to perform ADL    Min Type Additional Details    '[]'$  Estim:  '[]'$ Unatt       '[]'$ IFC  '[]'$ Premod                        '[]'$ Other:  '[]'$ w/ice   '[]'$ w/heat  Position:  Location:    '[]'$  Estim: '[]'$ Att    '[]'$ TENS instruct  '[]'$ NMES                    '[]'$ Other:  '[]'$ w/US   '[]'$ w/ice   '[]'$ w/heat  Position:  Location:    '[]'$   Traction: '[]'$  Cervical       '[]'$ Lumbar                       '[]'$  Prone          '[]'$ Supine                       '[]'$ Intermittent   '[]'$ Continuous Lbs:  '[]'$  before manual  '[]'$  after manual    '[]'$   Ultrasound: '[]'$ Continuous   '[]'$  Pulsed                           '[]'$ 1MHz   '[]'$ 3MHz W/cm2:  Location:    '[]'$   Iontophoresis with dexamethasone         Location: '[]'$  Take home patch   '[]'$  In clinic   10 '[x]'$   Ice post    '[]'$   heat  '[]'$   Ice massage  '[]'$   Laser   '[]'$   Anodyne Position:prone  Location:(B LSP    '[]'$   Laser with stim  '[]'$   Other:  Position:  Location:    '[]'$   Vasopneumatic Device Pressure:       '[]'$  lo '[]'$  med '[]'$  hi   Temperature: '[]'$  lo '[]'$  med '[]'$  hi   '[x]'$  Skin assessment post-treatment:  '[x]'$ intact '[]'$ redness- no adverse reaction    '[]'$ redness ??? adverse reaction:      min '[]'$ Eval                  '[]'$ Re-Eval       33 min Therapeutic Exercise:  '[x]'$  See flow sheet :    Rationale: increase ROM and increase strength to improve the patient???s ability to perform ADL      min Therapeutic Activity:  '[]'$   See flow sheet :   Rationale:   to improve the patient???s ability to       min Neuromuscular Re-education:  '[]'$   See flow sheet :   Rationale:  to improve the patient???s ability to     10 min Manual Therapy:  Checked alignment/P/A  mob   Rationale: decrease pain and increase ROM to perform ADL      min Gait Training:  ___ feet with ___ device on level surfaces with ___ level of assist   Rationale:          With   '[x]'$  TE   '[]'$  TA   '[]'$  neuro   '[]'$  other: Patient Education: '[x]'$  Review HEP    '[]'$  Progressed/Changed HEP based on:   '[]'$  positioning   '[]'$  body mechanics   '[]'$  transfers   '[]'$  heat/ice application    '[]'$  other:      Other Objective/Functional Measures: (L)  Innominate  Posterior  rotated    Pain Level (0-10 scale) post treatment: 0    ASSESSMENT/Changes in Function: Fair  Response  To each there ex.    Patient will continue to benefit from skilled PT services to address functional mobility deficits, address ROM deficits, address strength deficits, analyze and address soft tissue restrictions and analyze and cue movement patterns to attain remaining goals.     '[x]'$   See Plan of Care  '[]'$   See progress note/recertification  '[]'$   See Discharge Summary         Progress towards goals / Updated goals:  Short Term Goals:??To be accomplished in 5??treatments:  1. ??Pt will be compliant and independent with HEP in order to facilitate PT sessions and aid with self management  ????????????????????????????????????????????Eval Status: ??Initiated  ????????????????????????????????????????????Current Status: MET, compliant. ??02/22/17  2. ??Pt to tolerate 30 min or more of TE and/or Interventions w/o increased s/s  ????????????????????????????????????????????Eval Status: ??Initiated  ????????????????????????????????????????????Current Status: MET??02/23/16??  Long Term Goals:??To be accomplished in 10??treatments:  1. ??Pt will report 50% improvement or better with involvement to show a  significant increase in function  ????????????????????????????????????????????Eval Status: ??Initiated  ????????????????????????????????????????????Current Status: progressing, 35% improvement 02/22/2017  2. 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  ????????????????????????????????????????????Eval Status: ??Initiated  ????????????????????????????????????????????Current Status: MET, 0/10 at best 02/22/2017  3. ??Pt will have improved upright posturing to -10* or better to show improved postural alignment and increased core stability to aid with walking tolerance ????  ????????????????????????????????????????????Eval Status: ??Initiated  ????????????????????????????????????????????Current Status: ??MET 15 min standing tolerance on a good day??02/22/2017  4. ??Pt will negotiate 1 flight of steps x 2 Reps to transition up and down her stairs at home  ????????????????????????????????????????????Eval Status: ??Difficulty with negotiating stairs  ????????????????????????????????????????????Current Status: progressing, able to negotiate 1 flight at this time??02/22/2017  5. ??Pt will improve FOTO score to 56 in 12 visits??to show significant improvement in function for progress to little difficulty with usual daily work tasks  ????????????????????????????????????????????Eval Status: 47  ????????????????????????????????????????????Current Status:??progressing but not met; 52 points on??02/18/17    PLAN  '[]'$   Upgrade activities as tolerated     '[x]'$   Continue plan of care  '[]'$   Update interventions per flow sheet       '[]'$   Discharge due to:_  '[]'$   Other:_      Darrie Macmillan, PTA 03/01/2017  5:16 PM    Future Appointments  Date Time Provider [place]   03/04/2017 5:30 PM Willaim Rayas, [place] [facility]   03/05/2017 4:30 PM [place], DO [facility] ATHENA SCHED   08/02/2017 9:45 AM Carlyle Dolly, MD [place]

## 2017-03-04 ENCOUNTER — Inpatient Hospital Stay: Admit: 2017-03-04 | Payer: PRIVATE HEALTH INSURANCE | Primary: Internal Medicine

## 2017-03-04 NOTE — Progress Notes (Signed)
PT DAILY TREATMENT NOTE 12-16    Patient Name: Elaine [name]  Date:03/04/2017  DOB: [birth year]  '[x]'$   Patient DOB Verified  Payor: CIGNA / Plan: [place] [employer] / Product Type: HMO /    In time:5:25  Out time:6:09  Total Treatment Time (min): 41  Visit #: 1 of 8    Treatment Area: Low back pain [M54.5]    SUBJECTIVE  Pain Level (0-10 scale): 4  Any medication changes, allergies to medications, adverse drug reactions, diagnosis change, or new procedure performed?: '[x]'$  No    '[]'$  Yes (see summary sheet for update)  Subjective functional status/changes:   '[]'$  No changes reported  Still  Some pain.    OBJECTIVE    Modality rationale: decrease edema, decrease inflammation, decrease pain and increase tissue extensibility to improve the patient???s ability to perform ADL    Min Type Additional Details    '[]'$  Estim:  '[]'$ Unatt       '[]'$ IFC  '[]'$ Premod                        '[]'$ Other:  '[]'$ w/ice   '[]'$ w/heat  Position:  Location:   8 '[x]'$  Estim: '[x]'$ Att    '[]'$ TENS instruct  '[]'$ NMES                    '[x]'$ Other: LTO '[]'$ w/US   '[]'$ w/ice   '[]'$ w/heat  Position:prone  Location:(R) SIJ/LSP    '[]'$   Traction: '[]'$  Cervical       '[]'$ Lumbar                       '[]'$  Prone          '[]'$ Supine                       '[]'$ Intermittent   '[]'$ Continuous Lbs:  '[]'$  before manual  '[]'$  after manual    '[]'$   Ultrasound: '[]'$ Continuous   '[]'$  Pulsed                           '[]'$ 1MHz   '[]'$ 3MHz W/cm2:  Location:    '[]'$   Iontophoresis with dexamethasone         Location: '[]'$  Take home patch   '[]'$  In clinic    '[]'$   Ice     '[]'$   heat  '[]'$   Ice massage  '[]'$   Laser   '[]'$   Anodyne Position:  Location:    '[]'$   Laser with stim  '[]'$   Other:  Position:  Location:    '[]'$   Vasopneumatic Device Pressure:       '[]'$  lo '[]'$  med '[]'$  hi   Temperature: '[]'$  lo '[]'$  med '[]'$  hi   '[x]'$  Skin assessment post-treatment:  '[x]'$ intact '[]'$ redness- no adverse reaction    '[]'$ redness ??? adverse reaction:      min '[]'$ Eval                  '[]'$ Re-Eval       25 min Therapeutic Exercise:  '[x]'$  See flow sheet :    Rationale: increase ROM and increase strength to improve the patient???s ability to perform ADL      min Therapeutic Activity:  '[]'$   See flow sheet :   Rationale:   to improve the patient???s ability to       min Neuromuscular Re-education:  '[]'$   See flow sheet :   Rationale:  to improve the patient???s ability to     8 min Manual Therapy:  Checked  Alignment MET   Rationale: decrease pain, increase ROM, increase tissue extensibility and decrease edema  to perform ADL      min Gait Training:  ___ feet with ___ device on level surfaces with ___ level of assist   Rationale:          With   '[x]'$  TE   '[]'$  TA   '[]'$  neuro   '[]'$  other: Patient Education: '[x]'$  Review HEP    '[]'$  Progressed/Changed HEP based on:   '[]'$  positioning   '[]'$  body mechanics   '[]'$  transfers   '[]'$  heat/ice application    '[]'$  other:      Other Objective/Functional Measures: TTP  At (R) SIJ/LSP   (R)  Innominate  Posterior  rotated    Pain Level (0-10 scale) post treatment: 0    ASSESSMENT/Changes in Function: Fair  Response  To each there ex.    Patient will continue to benefit from skilled PT services to address functional mobility deficits, address ROM deficits, address strength deficits, analyze and address soft tissue restrictions, analyze and cue movement patterns and instruct in home and community integration to attain remaining goals.     '[x]'$   See Plan of Care  '[]'$   See progress note/recertification  '[]'$   See Discharge Summary         Progress towards goals / Updated goals:  Short Term Goals:??To be accomplished in 5??treatments:  1. ??Pt will be compliant and independent with HEP in order to facilitate PT sessions and aid with self management  ????????????????????????????????????????????Eval Status: ??Initiated  ????????????????????????????????????????????Current Status: MET, compliant. ??02/22/17  2. ??Pt to tolerate 30 min or more of TE and/or Interventions w/o increased s/s  ????????????????????????????????????????????Eval Status: ??Initiated  ????????????????????????????????????????????Current Status: MET??02/23/16??   Long Term Goals:??To be accomplished in 10??treatments:  1. ??Pt will report 50% improvement or better with involvement to show a significant increase in function  ????????????????????????????????????????????Eval Status: ??Initiated  ????????????????????????????????????????????Current Status: progressing, 35% improvement 02/22/2017  2. 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  ????????????????????????????????????????????Eval Status: ??Initiated  ????????????????????????????????????????????Current Status: MET, 0/10 at best 02/22/2017  3. ??Pt will have improved upright posturing to -10* or better to show improved postural alignment and increased core stability to aid with walking tolerance ????  ????????????????????????????????????????????Eval Status: ??Initiated  ????????????????????????????????????????????Current Status: ??MET 15 min standing tolerance on a good day??02/22/2017  4. ??Pt will negotiate 1 flight of steps x 2 Reps to transition up and down her stairs at home  ????????????????????????????????????????????Eval Status: ??Difficulty with negotiating stairs  ????????????????????????????????????????????Current Status: progressing, able to negotiate 1 flight at this time??02/22/2017  5. ??Pt will improve FOTO score to 56 in 12 visits??to show significant improvement in function for progress to little difficulty with usual daily work tasks  ????????????????????????????????????????????Eval Status: 47  ????????????????????????????????????????????Current Status:??progressing but not met; 52 points on??02/18/17    PLAN  '[]'$   Upgrade activities as tolerated     '[]'$   Continue plan of care  '[]'$   Update interventions per flow sheet       '[]'$   Discharge due to:_  '[]'$   Other:_      Bliss Tsang, PTA 03/04/2017  5:35 PM    Future Appointments  Date Time Provider [place]   03/05/2017 4:30 PM [place], DO [facility] ATHENA SCHED   08/02/2017 9:45 AM Carlyle Dolly, MD [place]

## 2017-03-05 ENCOUNTER — Encounter: Attending: Internal Medicine | Primary: Internal Medicine

## 2017-03-15 ENCOUNTER — Inpatient Hospital Stay: Admit: 2017-03-15 | Payer: PRIVATE HEALTH INSURANCE | Primary: Internal Medicine

## 2017-03-15 DIAGNOSIS — M545 Low back pain: Secondary | ICD-10-CM

## 2017-03-15 NOTE — Progress Notes (Signed)
PT DAILY TREATMENT NOTE 12-16    Patient Name: Elaine [name]  Date:03/15/2017  DOB: 06/11/1951  [x]  Patient DOB Verified  Payor: CIGNA / Plan: [place] [employer] / Product Type: Commerical /    In time:4:47  Out time: 5:35  Total Treatment Time (min):48  Visit #: 2 of 8    Treatment Area: Low back pain [M54.5]    SUBJECTIVE  Pain Level (0-10 scale): 2  Any medication changes, allergies to medications, adverse drug reactions, diagnosis change, or new procedure performed?: [x] No    [] Yes (see summary sheet for update)  Subjective functional status/changes:   [] No changes reported  Feeling better.    OBJECTIVE    Modality rationale: decrease edema, decrease inflammation, decrease pain and increase tissue extensibility to improve the patient???s ability to perform ADL    Min Type Additional Details    [] Estim:  []Unatt       []IFC  []Premod                        []Other:  []w/ice   []w/heat  Position:  Location:    [] Estim: []Att    []TENS instruct  []NMES                    []Other:  []w/US   []w/ice   []w/heat  Position:  Location:    []  Traction: [] Cervical       []Lumbar                       [] Prone          []Supine                       []Intermittent   []Continuous Lbs:  [] before manual  [] after manual    []  Ultrasound: []Continuous   [] Pulsed                           []1MHz   []3MHz W/cm2:  Location:    []  Iontophoresis with dexamethasone         Location: [] Take home patch   [] In clinic    []  Ice     []  heat  []  Ice massage  []  Laser   []  Anodyne Position:  Location:    []  Laser with stim  []  Other:  Position:  Location:    []  Vasopneumatic Device Pressure:       [] lo [] med [] hi   Temperature: [] lo [] med [] hi   [x] Skin assessment post-treatment:  [x]intact []redness- no adverse reaction    []redness ??? adverse reaction:      min []Eval                  []Re-Eval       40 min Therapeutic Exercise:  [x] See flow sheet :    Rationale: increase ROM and increase strength to improve the patient???s ability to perform  ADL      min Therapeutic Activity:  []  See flow sheet :   Rationale:   to improve the patient???s ability to       min Neuromuscular Re-education:  []  See flow sheet :   Rationale:  to improve the patient???s ability to     8 min Manual Therapy:  Checked  Alignment  MET   Rationale: decrease pain, increase ROM, increase tissue extensibility and decrease edema  to perform ADL     min Gait Training:  ___ feet with ___ device on level surfaces with ___ level of assist   Rationale:          With   [x] TE   [] TA   [] neuro   [] other: Patient Education: [x] Review HEP    [] Progressed/Changed HEP based on:   [] positioning   [] body mechanics   [] transfers   [] heat/ice application    [] other:      Other Objective/Functional Measures:  (L) innominate  Posterior  rotated    Pain Level (0-10 scale) post treatment:0     ASSESSMENT/Changes in Function: Responded   Fairly  Well  To each there ex.    Patient will continue to benefit from skilled PT services to address functional mobility deficits, address ROM deficits, address strength deficits, analyze and address soft tissue restrictions, analyze and cue movement patterns and instruct in home and community integration to attain remaining goals.     [x]  See Plan of Care  []  See progress note/recertification  []  See Discharge Summary         Progress towards goals / Updated goals:  Short Term Goals:??To be accomplished in 5??treatments:  1. ??Pt will be compliant and independent with HEP in order to facilitate PT sessions and aid with self management  ????????????????????????????????????????????Eval Status: ??Initiated  ????????????????????????????????????????????Current Status: MET, compliant. ??02/22/17  2. ??Pt to tolerate 30 min or more of TE and/or Interventions w/o increased s/s  ????????????????????????????????????????????Eval Status: ??Initiated  ????????????????????????????????????????????Current Status: MET??02/23/16??   Long Term Goals:??To be accomplished in 10??treatments:  1. ??Pt will report 50% improvement or better with involvement to show a significant increase in function  ????????????????????????????????????????????Eval Status: ??Initiated  ????????????????????????????????????????????Current Status: progressing, 35% improvement 02/22/2017  2. 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  ????????????????????????????????????????????Eval Status: ??Initiated  ????????????????????????????????????????????Current Status: MET, 0/10 at best 02/22/2017  3. ??Pt will have improved upright posturing to -10* or better to show improved postural alignment and increased core stability to aid with walking tolerance ????  ????????????????????????????????????????????Eval Status: ??Initiated  ????????????????????????????????????????????Current Status: ??MET 15 min standing tolerance on a good day??02/22/2017  4. ??Pt will negotiate 1 flight of steps x 2 Reps to transition up and down her stairs at home  ????????????????????????????????????????????Eval Status: ??Difficulty with negotiating stairs  ????????????????????????????????????????????Current Status: progressing, able to negotiate 1 flight at this time??02/22/2017  5. ??Pt will improve FOTO score to 56 in 12 visits??to show significant improvement in function for progress to little difficulty with usual daily work tasks  ????????????????????????????????????????????Eval Status: 47  ????????????????????????????????????????????Current Status:??progressing but not met; 52 points on??02/18/17    PLAN  []  Upgrade activities as tolerated     []  Continue plan of care  []  Update interventions per flow sheet       []  Discharge due to:_  []  Other:_      Reeya Bound, PTA 03/15/2017  4:54 PM    Future Appointments  Date Time Provider [place]   03/18/2017 5:30 PM Amylah Will [place], PTA [facility]   03/22/2017 5:00 PM Edenilson [place], PTA [facility]   03/24/2017 5:00 PM Audryanna Zur[place], PTA [facility]  03/29/2017 5:00 PM [place], DO [facility] ATHENA SCHED   08/02/2017 9:45 AM Carlyle Dolly, MD [place]

## 2017-03-18 ENCOUNTER — Encounter: Payer: PRIVATE HEALTH INSURANCE | Primary: Internal Medicine

## 2017-03-20 ENCOUNTER — Encounter

## 2017-03-22 ENCOUNTER — Inpatient Hospital Stay: Admit: 2017-03-22 | Payer: PRIVATE HEALTH INSURANCE | Primary: Internal Medicine

## 2017-03-22 MED ORDER — ATORVASTATIN 40 MG TAB
40 mg | ORAL_TABLET | ORAL | 3 refills | Status: DC
Start: 2017-03-22 — End: 2017-07-31

## 2017-03-22 NOTE — Progress Notes (Signed)
PT DAILY TREATMENT NOTE 12-16    Patient Name: Elaine [name]  Date:03/22/2017  DOB: 04/28/1951  '[x]'$   Patient DOB Verified  Payor: CIGNA / Plan: [place] [employer] H[employer] / Product Type: HMO /    In time:5:20 Out time:5:44  Total Treatment Time (min):41  Visit #: 3 of 8    Treatment Area: Low back pain [M54.5]    SUBJECTIVE  Pain Level (0-10 scale): 2  Any medication changes, allergies to medications, adverse drug reactions, diagnosis change, or new procedure performed?: '[x]'$  No    '[]'$  Yes (see summary sheet for update)  Subjective functional status/changes:   '[]'$  No changes reported  Little pain    OBJECTIVE    Modality rationale: decrease edema, decrease inflammation, decrease pain and increase tissue extensibility to improve the patient???s ability to perform ADL   Min Type Additional Details    '[]'$  Estim:  '[]'$ Unatt       '[]'$ IFC  '[]'$ Premod                        '[]'$ Other:  '[]'$ w/ice   '[]'$ w/heat  Position:  Location:    '[]'$  Estim: '[]'$ Att    '[]'$ TENS instruct  '[]'$ NMES                    '[]'$ Other:  '[]'$ w/US   '[]'$ w/ice   '[]'$ w/heat  Position:  Location:    '[]'$   Traction: '[]'$  Cervical       '[]'$ Lumbar                       '[]'$  Prone          '[]'$ Supine                       '[]'$ Intermittent   '[]'$ Continuous Lbs:  '[]'$  before manual  '[]'$  after manual    '[]'$   Ultrasound: '[]'$ Continuous   '[]'$  Pulsed                           '[]'$ 1MHz   '[]'$ 3MHz W/cm2:  Location:    '[]'$   Iontophoresis with dexamethasone         Location: '[]'$  Take home patch   '[]'$  In clinic    '[]'$   Ice     '[]'$   heat  '[]'$   Ice massage  '[]'$   Laser   '[]'$   Anodyne Position:  Location:    '[]'$   Laser with stim  '[]'$   Other:  Position:  Location:    '[]'$   Vasopneumatic Device Pressure:       '[]'$  lo '[]'$  med '[]'$  hi   Temperature: '[]'$  lo '[]'$  med '[]'$  hi   '[x]'$  Skin assessment post-treatment:  '[x]'$ intact '[]'$ redness- no adverse reaction    '[]'$ redness ??? adverse reaction:      min '[]'$ Eval                  '[]'$ Re-Eval       35 min Therapeutic Exercise:  '[]'$  See flow sheet :   Rationale: increase ROM and increase strength to improve the patient???s  ability to perform ADL      min Therapeutic Activity:  '[]'$   See flow sheet :   Rationale:   to improve the patient???s ability to       min Neuromuscular Re-education:  '[]'$   See flow sheet :   Rationale:   to improve the patient???s  ability to      min Manual Therapy:     Rationale: decrease pain, increase ROM, increase tissue extensibility and decrease edema  to perform ADL      min Gait Training:  ___ feet with ___ device on level surfaces with ___ level of assist   Rationale:          With   '[x]'$  TE   '[]'$  TA   '[]'$  neuro   '[]'$  other: Patient Education: '[x]'$  Review HEP    '[]'$  Progressed/Changed HEP based on:   '[]'$  positioning   '[]'$  body mechanics   '[]'$  transfers   '[]'$  heat/ice application    '[]'$  other:      Other Objective/Functional Measures:  Cues  On performing  There ex correctly  oov    Pain Level (0-10 scale) post treatment: 0    ASSESSMENT/Changes in Function: Fair  Response  To each there ex    Patient will continue to benefit from skilled PT services to address functional mobility deficits, address ROM deficits, address strength deficits, analyze and address soft tissue restrictions, analyze and cue movement patterns and instruct in home and community integration to attain remaining goals.     '[x]'$   See Plan of Care  '[]'$   See progress note/recertification  '[]'$   See Discharge Summary         Progress towards goals / Updated goals:  Short Term Goals:??To be accomplished in 5??treatments:  1. ??Pt will be compliant and independent with HEP in order to facilitate PT sessions and aid with self management  ????????????????????????????????????????????Eval Status: ??Initiated  ????????????????????????????????????????????Current Status: MET, compliant. ??02/22/17  2. ??Pt to tolerate 30 min or more of TE and/or Interventions w/o increased s/s  ????????????????????????????????????????????Eval Status: ??Initiated  ????????????????????????????????????????????Current Status: MET??02/23/16??  Long Term Goals:??To be accomplished in 10??treatments:  1. ??Pt will report 50% improvement or better with involvement to show a  significant increase in function  ????????????????????????????????????????????Eval Status: ??Initiated  ????????????????????????????????????????????Current Status: progressing, 35% improvement 02/22/2017  2. 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  ????????????????????????????????????????????Eval Status: ??Initiated  ????????????????????????????????????????????Current Status: MET, 0/10 at best 02/22/2017  3. ??Pt will have improved upright posturing to -10* or better to show improved postural alignment and increased core stability to aid with walking tolerance ????  ????????????????????????????????????????????Eval Status: ??Initiated  ????????????????????????????????????????????Current Status: ??MET 15 min standing tolerance on a good day??02/22/2017  4. ??Pt will negotiate 1 flight of steps x 2 Reps to transition up and down her stairs at home  ????????????????????????????????????????????Eval Status: ??Difficulty with negotiating stairs  ????????????????????????????????????????????Current Status: progressing, able to negotiate 1 flight at this time??02/22/2017  5. ??Pt will improve FOTO score to 56 in 12 visits??to show significant improvement in function for progress to little difficulty with usual daily work tasks  ????????????????????????????????????????????Eval Status: 47  ????????????????????????????????????????????Current Status:??progressing but not met; 52 points on??02/18/17    PLAN  '[]'$   Upgrade activities as tolerated     '[x]'$   Continue plan of care  '[]'$   Update interventions per flow sheet       '[]'$   Discharge due to:_  '[]'$   Other:_      Ajani Rineer, PTA 03/22/2017  5:42 PM    Future Appointments  Date Time Provider [place]   03/24/2017 5:00 PM Drew Herman [place], PTA [id number]   03/29/2017 5:00 PM [place], DO [facility] SCHED   08/02/2017 9:45 AM Carlyle Dolly, MD [place]

## 2017-03-22 NOTE — Telephone Encounter (Signed)
Lipitor refilled and sent to pharmacy. Please call and notify pt.

## 2017-03-24 ENCOUNTER — Inpatient Hospital Stay: Admit: 2017-03-24 | Payer: PRIVATE HEALTH INSURANCE | Primary: Internal Medicine

## 2017-03-24 NOTE — Progress Notes (Addendum)
PT DAILY TREATMENT NOTE 12-16    Patient Name: Elaine [name]  Date:03/24/2017  DOB: May 19, 1951  '[x]'$   Patient DOB Verified  Payor: CIGNA / Plan: [place] HMO / Product Type: HMO /    In time:5:08  Out time:6:00  Total Treatment Time (min): 40  Visit #: 3 of 8    Treatment Area: Low back pain [M54.5]    SUBJECTIVE  Pain Level (0-10 scale): 0  Any medication changes, allergies to medications, adverse drug reactions, diagnosis change, or new procedure performed?: '[x]'$  No    '[]'$  Yes (see summary sheet for update)  Subjective functional status/changes:   '[]'$  No changes reported  No pain. Feeling  Good.    OBJECTIVE    Modality rationale: decrease edema, decrease inflammation, decrease pain and increase tissue extensibility to improve the patient???s ability to perform ADL    Min Type Additional Details    '[]'$  Estim:  '[]'$ Unatt       '[]'$ IFC  '[]'$ Premod                        '[]'$ Other:  '[]'$ w/ice   '[]'$ w/heat  Position:  Location:    '[]'$  Estim: '[]'$ Att    '[]'$ TENS instruct  '[]'$ NMES                    '[]'$ Other:  '[]'$ w/US   '[]'$ w/ice   '[]'$ w/heat  Position:  Location:    '[]'$   Traction: '[]'$  Cervical       '[]'$ Lumbar                       '[]'$  Prone          '[]'$ Supine                       '[]'$ Intermittent   '[]'$ Continuous Lbs:  '[]'$  before manual  '[]'$  after manual    '[]'$   Ultrasound: '[]'$ Continuous   '[]'$  Pulsed                           '[]'$ 1MHz   '[]'$ 3MHz W/cm2:  Location:    '[]'$   Iontophoresis with dexamethasone         Location: '[]'$  Take home patch   '[]'$  In clinic    '[]'$   Ice     '[]'$   heat  '[]'$   Ice massage  '[]'$   Laser   '[]'$   Anodyne Position:  Location:    '[]'$   Laser with stim  '[]'$   Other:  Position:  Location:    '[]'$   Vasopneumatic Device Pressure:       '[]'$  lo '[]'$  med '[]'$  hi   Temperature: '[]'$  lo '[]'$  med '[]'$  hi   '[x]'$  Skin assessment post-treatment:  '[]'$ intact '[]'$ redness- no adverse reaction    '[]'$ redness ??? adverse reaction:      min '[]'$ Eval                  '[]'$ Re-Eval       20 min Therapeutic Exercise:  '[x]'$  See flow sheet :    Rationale: increase ROM and increase strength to improve the patient???s ability to perform ADL     10 min Therapeutic Activity:  '[]'$   See flow sheet :   Rationale:   to improve the patient???s ability to      10 min Neuromuscular Re-education:  '[]'$   See flow sheet :   Rationale:  to improve the patient???s ability to perform ADL      min Manual Therapy:     Rationale: decrease pain, increase ROM, increase tissue extensibility and decrease edema  to perform ADL      min Gait Training:  ___ feet with ___ device on level surfaces with ___ level of assist   Rationale:          With   '[x]'$  TE   '[]'$  TA   '[]'$  neuro   '[]'$  other: Patient Education: '[x]'$  Review HEP    '[]'$  Progressed/Changed HEP based on:   '[]'$  positioning   '[]'$  body mechanics   '[]'$  transfers   '[]'$  heat/ice application    '[]'$  other:      Other Objective/Functional Measures:  Completed  Each there ex  Fairly  well    Pain Level (0-10 scale) post treatment: 0    ASSESSMENT/Changes in Function: Benefited  With treatment.    Patient will continue to benefit from skilled PT services to address functional mobility deficits, address ROM deficits, address strength deficits, analyze and address soft tissue restrictions, analyze and cue movement patterns and instruct in home and community integration to attain remaining goals.     '[x]'$   See Plan of Care  '[]'$   See progress note/recertification  '[]'$   See Discharge Summary         Progress towards goals / Updated goals:  Short Term Goals:??To be accomplished in 5??treatments:  1. ??Pt will be compliant and independent with HEP in order to facilitate PT sessions and aid with self management  ????????????????????????????????????????????Eval Status: ??Initiated  ????????????????????????????????????????????Current Status: MET, compliant. ??02/22/17  2. ??Pt to tolerate 30 min or more of TE and/or Interventions w/o increased s/s  ????????????????????????????????????????????Eval Status: ??Initiated  ????????????????????????????????????????????Current Status: MET??02/23/16??  Long Term Goals:??To be accomplished in 10??treatments:   1. ??Pt will report 50% improvement or better with involvement to show a significant increase in function  ????????????????????????????????????????????Eval Status: ??Initiated  ????????????????????????????????????????????Current Status: progressing, 35% improvement 02/22/2017  2. 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  ????????????????????????????????????????????Eval Status: ??Initiated  ????????????????????????????????????????????Current Status: MET, 0/10 at best 02/22/2017  3. ??Pt will have improved upright posturing to -10* or better to show improved postural alignment and increased core stability to aid with walking tolerance ????  ????????????????????????????????????????????Eval Status: ??Initiated  ????????????????????????????????????????????Current Status: ??MET 15 min standing tolerance on a good day??02/22/2017  4. ??Pt will negotiate 1 flight of steps x 2 Reps to transition up and down her stairs at home  ????????????????????????????????????????????Eval Status: ??Difficulty with negotiating stairs  ????????????????????????????????????????????Current Status: progressing, able to negotiate 1 flight at this time??02/22/2017  5. ??Pt will improve FOTO score to 56 in 12 visits??to show significant improvement in function for progress to little difficulty with usual daily work tasks  ????????????????????????????????????????????Eval Status: 47  ????????????????????????????????????????????Current Status:??progressing but not met; 52 points on??02/18/17    PLAN  '[]'$   Upgrade activities as tolerated     '[x]'$   Continue plan of care  '[]'$   Update interventions per flow sheet       '[]'$   Discharge due to:_  '[]'$   Other:_      Bland Rudzinski, PTA 03/24/2017  5:44 PM    Future Appointments  Date Time Provider [place]   03/29/2017 5:00 PM [place], DO [facility] SCHED   08/02/2017 9:45 AM Carlyle Dolly, MD [place]

## 2017-03-29 ENCOUNTER — Ambulatory Visit
Admit: 2017-03-29 | Discharge: 2017-03-29 | Payer: PRIVATE HEALTH INSURANCE | Attending: Internal Medicine | Primary: Internal Medicine

## 2017-03-29 DIAGNOSIS — R7303 Prediabetes: Secondary | ICD-10-CM

## 2017-03-29 DIAGNOSIS — E032 Hypothyroidism due to medicaments and other exogenous substances: Secondary | ICD-10-CM

## 2017-03-29 NOTE — Progress Notes (Signed)
Patient is in the office today for a ED follow up.      1. Have you been to the ER, urgent care clinic since your last visit?  Hospitalized since your last visit?yes, [facility]     2. Have you seen or consulted any other health care providers outside of [place] since your last visit?  Include any pap smears or colon screening. Yes, Dr. Angelique [facility].

## 2017-03-29 NOTE — Patient Instructions (Addendum)
A Healthy Lifestyle: Care Instructions  Your Care Instructions    A healthy lifestyle can help you feel good, stay at a healthy weight, and have plenty of energy for both work and play. A healthy lifestyle is something you can share with your whole family.  A healthy lifestyle also can lower your risk for serious health problems, such as high blood pressure, heart disease, and diabetes.  You can follow a few steps listed below to improve your health and the health of your family.  Follow-up care is a key part of your treatment and safety. Be sure to make and go to all appointments, and call your doctor if you are having problems. It's also a good idea to know your test results and keep a list of the medicines you take.  How can you care for yourself at home?  ?? Do not eat too much sugar, fat, or fast foods. You can still have dessert and treats now and then. The goal is moderation.  ?? Start small to improve your eating habits. Pay attention to portion sizes, drink less juice and soda pop, and eat more fruits and vegetables.  ?? Eat a healthy amount of food. A 3-ounce serving of meat, for example, is about the size of a deck of cards. Fill the rest of your plate with vegetables and whole grains.  ?? Limit the amount of soda and sports drinks you have every day. Drink more water when you are thirsty.  ?? Eat at least 5 servings of fruits and vegetables every day. It may seem like a lot, but it is not hard to reach this goal. A serving or helping is 1 piece of fruit, 1 cup of vegetables, or 2 cups of leafy, raw vegetables. Have an apple or some carrot sticks as an afternoon snack instead of a candy bar. Try to have fruits and/or vegetables at every meal.  ?? Make exercise part of your daily routine. You may want to start with simple activities, such as walking, bicycling, or slow swimming. Try to be active 30 to 60 minutes every day. You do not need to do all 30 to 60  minutes all at once. For example, you can exercise 3 times a day for 10 or 20 minutes. Moderate exercise is safe for most people, but it is always a good idea to talk to your doctor before starting an exercise program.  ?? Keep moving. Mow the lawn, work in the garden, or clean your house. Take the stairs instead of the elevator at work.  ?? If you smoke, quit. People who smoke have an increased risk for heart attack, stroke, cancer, and other lung illnesses. Quitting is hard, but there are ways to boost your chance of quitting tobacco for good.  ?? Use nicotine gum, patches, or lozenges.  ?? Ask your doctor about stop-smoking programs and medicines.  ?? Keep trying.  In addition to reducing your risk of diseases in the future, you will notice some benefits soon after you stop using tobacco. If you have shortness of breath or asthma symptoms, they will likely get better within a few weeks after you quit.  ?? Limit how much alcohol you drink. Moderate amounts of alcohol (up to 2 drinks a day for men, 1 drink a day for women) are okay. But drinking too much can lead to liver problems, high blood pressure, and other health problems.  Family health  If you have a family, there are many things you   can do together to improve your health.  ?? Eat meals together as a family as often as possible.  ?? Eat healthy foods. This includes fruits, vegetables, lean meats and dairy, and whole grains.  ?? Include your family in your fitness plan. Most people think of activities such as jogging or tennis as the way to fitness, but there are many ways you and your family can be more active. Anything that makes you breathe hard and gets your heart pumping is exercise. Here are some tips:  ?? Walk to do errands or to take your child to school or the bus.  ?? Go for a family bike ride after dinner instead of watching TV.  Where can you learn more?  Go to http://www.healthwise.net/GoodHelpConnections.   Enter U807 in the search box to learn more about "A Healthy Lifestyle: Care Instructions."  Current as of: Apr 17, 2016  Content Version: 11.4  ?? 2006-2017 [employer], Inc[employer]. Care instructions adapted under license by [employer] (which disclaims liability or warranty for this information). If you have questions about a medical condition or this instruction, always ask your healthcare professional. Healthwise, Incorporated disclaims any warranty or liability for your use of this information.       DASH Diet: Care Instructions  Your Care Instructions    The DASH diet is an eating plan that can help lower your blood pressure. DASH stands for Dietary Approaches to Stop Hypertension. Hypertension is high blood pressure.  The DASH diet focuses on eating foods that are high in calcium, potassium, and magnesium. These nutrients can lower blood pressure. The foods that are highest in these nutrients are fruits, vegetables, low-fat dairy products, nuts, seeds, and legumes. But taking calcium, potassium, and magnesium supplements instead of eating foods that are high in those nutrients does not have the same effect. The DASH diet also includes whole grains, fish, and poultry.  The DASH diet is one of several lifestyle changes your doctor may recommend to lower your high blood pressure. Your doctor may also want you to decrease the amount of sodium in your diet. Lowering sodium while following the DASH diet can lower blood pressure even further than just the DASH diet alone.  Follow-up care is a key part of your treatment and safety. Be sure to make and go to all appointments, and call your doctor if you are having problems. It's also a good idea to know your test results and keep a list of the medicines you take.  How can you care for yourself at home?  Following the DASH diet  ?? Eat 4 to 5 servings of fruit each day. A serving is 1 medium-sized piece  of fruit, ?? cup chopped or canned fruit, 1/4 cup dried fruit, or 4 ounces (?? cup) of fruit juice. Choose fruit more often than fruit juice.  ?? Eat 4 to 5 servings of vegetables each day. A serving is 1 cup of lettuce or raw leafy vegetables, ?? cup of chopped or cooked vegetables, or 4 ounces (?? cup) of vegetable juice. Choose vegetables more often than vegetable juice.  ?? Get 2 to 3 servings of low-fat and fat-free dairy each day. A serving is 8 ounces of milk, 1 cup of yogurt, or 1 ?? ounces of cheese.  ?? Eat 6 to 8 servings of grains each day. A serving is 1 slice of bread, 1 ounce of dry cereal, or ?? cup of cooked rice, pasta, or cooked cereal. Try to choose whole-grain products   as much as possible.  ?? Limit lean meat, poultry, and fish to 2 servings each day. A serving is 3 ounces, about the size of a deck of cards.  ?? Eat 4 to 5 servings of nuts, seeds, and legumes (cooked dried beans, lentils, and split peas) each week. A serving is 1/3 cup of nuts, 2 tablespoons of seeds, or ?? cup of cooked beans or peas.  ?? Limit fats and oils to 2 to 3 servings each day. A serving is 1 teaspoon of vegetable oil or 2 tablespoons of salad dressing.  ?? Limit sweets and added sugars to 5 servings or less a week. A serving is 1 tablespoon jelly or jam, ?? cup sorbet, or 1 cup of lemonade.  ?? Eat less than 2,300 milligrams (mg) of sodium a day. If you limit your sodium to 1,500 mg a day, you can lower your blood pressure even more.  Tips for success  ?? Start small. Do not try to make dramatic changes to your diet all at once. You might feel that you are missing out on your favorite foods and then be more likely to not follow the plan. Make small changes, and stick with them. Once those changes become habit, add a few more changes.  ?? Try some of the following:  ?? Make it a goal to eat a fruit or vegetable at every meal and at snacks. This will make it easy to get the recommended amount of fruits and vegetables each day.   ?? Try yogurt topped with fruit and nuts for a snack or healthy dessert.  ?? Add lettuce, tomato, cucumber, and onion to sandwiches.  ?? Combine a ready-made pizza crust with low-fat mozzarella cheese and lots of vegetable toppings. Try using tomatoes, squash, spinach, broccoli, carrots, cauliflower, and onions.  ?? Have a variety of cut-up vegetables with a low-fat dip as an appetizer instead of chips and dip.  ?? Sprinkle sunflower seeds or chopped almonds over salads. Or try adding chopped walnuts or almonds to cooked vegetables.  ?? Try some vegetarian meals using beans and peas. Add garbanzo or kidney beans to salads. Make burritos and tacos with mashed pinto beans or black beans.  Where can you learn more?  Go to [employer]Wrest[employer].at.  Enter [phone] in the search box to learn more about "DASH Diet: Care Instructions."  Current as of: August 28, 2015  Content Version: 11.4  ?? 2006-2017 Healthwise, Inc[employer]. Care instructions adapted under license by [employer] (which disclaims liability or warranty for this information). If you have questions about a medical condition or this instruction, always ask your healthcare professional. [place] any warranty or liability for your use of this information.       Hypothyroidism: Care Instructions  Your Care Instructions    You have hypothyroidism, which means that your body is not making enough thyroid hormone. This hormone helps your body use energy. If your thyroid level is low, you may feel tired, be constipated, have an increase in your blood pressure, or have dry skin or memory problems. You may also get cold easily, even when it is warm. Women with low thyroid levels may have heavy menstrual periods.  A blood test to find your thyroid-stimulating hormone (TSH) level is used to check for hypothyroidism. A high TSH level may mean that you have low  thyroid. When your body is not making enough thyroid hormone, TSH levels rise in an effort to make the body produce more.  The treatment for hypothyroidism is to take thyroid hormone pills. You should start to feel better in 1 to 2 weeks. But it can take several months to see changes in the TSH level. You will need regular visits with your doctor to make sure you have the right dose of medicine.  Most people need treatment for the rest of their lives. You will need to see your doctor regularly to have blood tests and to make sure you are doing well.  Follow-up care is a key part of your treatment and safety. Be sure to make and go to all appointments, and call your doctor if you are having problems. It's also a good idea to know your test results and keep a list of the medicines you take.  How can you care for yourself at home?  ?? Take your thyroid hormone medicine exactly as prescribed. Call your doctor if you think you are having a problem with your medicine. Most people do not have side effects if they take the right amount of medicine regularly.  ?? Take the medicine 30 minutes before breakfast, and do not take it with calcium, vitamins, or iron.  ?? Do not take extra doses of your thyroid medicine. It will not help you get better any faster, and it may cause side effects.  ?? If you forget to take a dose, do NOT take a double dose of medicine. Take your usual dose the next day.  ?? Tell your doctor about all prescription, herbal, or over-the-counter products you take.  ?? Take care of yourself. Eat a healthy diet, get enough sleep, and get regular exercise.  When should you call for help?  Call 911 anytime you think you may need emergency care. For example, call if:  ? ?? You passed out (lost consciousness).   ? ?? You have severe trouble breathing.   ? ?? You have a very slow heartbeat (less than 60 beats a minute).   ? ?? You have a low body temperature (95??F or below).    ?Call your doctor now or seek immediate medical care if:  ? ?? You feel tired, sluggish, or weak.   ? ?? You have trouble remembering things or concentrating.   ? ?? You do not begin to feel better 2 weeks after starting your medicine.   ?Watch closely for changes in your health, and be sure to contact your doctor if you have any problems.  Where can you learn more?  Go to [employer]at.  Enter [phone] in the search box to learn more about "Hypothyroidism: Care Instructions."  Current as of: Apr 17, 2016  Content Version: 11.4  ?? 2006-2017 Healthwise, Inc[employer]. Care instructions adapted under license by [employer] (which disclaims liability or warranty for this information). If you have questions about a medical condition or this instruction, always ask your healthcare professional. [place] any warranty or liability for your use of this information.       Learning About the [place] Diet  What is the Mediterranean diet?    The Mediterranean diet is a style of eating rather than a diet plan. It features foods eaten in [place], [place], southern [place] and [place], and other countries along the [employer]. It emphasizes eating foods like fish, fruits, vegetables, beans, high-fiber breads and whole grains, nuts, and olive oil. This style of eating includes limited red meat, cheese, and sweets.  Why choose the Mediterranean diet?  A Mediterranean-style diet may improve heart health.  It contains more fat than other heart-healthy diets. But the fats are mainly from nuts, unsaturated oils (such as fish oils and olive oil), and certain nut or seed oils (such as canola, soybean, or flaxseed oil). These fats may help protect the heart and blood vessels.  How can you get started on the Mediterranean diet?  Here are some things you can do to switch to a more Mediterranean way of eating.  What to eat   ?? Eat a variety of fruits and vegetables each day, such as grapes, blueberries, tomatoes, broccoli, peppers, figs, olives, spinach, eggplant, beans, lentils, and chickpeas.  ?? Eat a variety of whole-grain foods each day, such as oats, brown rice, and whole wheat bread, pasta, and couscous.  ?? Eat fish at least 2 times a week. Try tuna, salmon, mackerel, lake trout, herring, or sardines.  ?? Eat moderate amounts of low-fat dairy products, such as milk, cheese, or yogurt.  ?? Eat moderate amounts of poultry and eggs.  ?? Choose healthy (unsaturated) fats, such as nuts, olive oil, and certain nut or seed oils like canola, soybean, and flaxseed.  ?? Limit unhealthy (saturated) fats, such as butter, palm oil, and coconut oil. And limit fats found in animal products, such as meat and dairy products made with whole milk. Try to eat red meat only a few times a month in very small amounts.  ?? Limit sweets and desserts to only a few times a week. This includes sugar-sweetened drinks like soda.  The Mediterranean diet may also include red wine with your meal-1 glass each day for women and up to 2 glasses a day for men.  Tips for eating at home  ?? Use herbs, spices, garlic, lemon zest, and citrus juice instead of salt to add flavor to foods.  ?? Add avocado slices to your sandwich instead of bacon.  ?? Have fish for lunch or dinner instead of red meat. Brush the fish with olive oil, and broil or grill it.  ?? Sprinkle your salad with seeds or nuts instead of cheese.  ?? Cook with olive or canola oil instead of butter or oils that are high in saturated fat.  ?? Switch from 2% milk or whole milk to 1% or fat-free milk.  ?? Dip raw vegetables in a vinaigrette dressing or hummus instead of dips made from mayonnaise or sour cream.  ?? Have a piece of fruit for dessert instead of a piece of cake. Try baked apples, or have some dried fruit.  Tips for eating out  ?? Try broiled, grilled, baked, or poached fish instead of having it fried  or breaded.  ?? Ask your server to have your meals prepared with olive oil instead of butter.  ?? Order dishes made with marinara sauce or sauces made from olive oil. Avoid sauces made from cream or mayonnaise.  ?? Choose whole-grain breads, whole wheat pasta and pizza crust, brown rice, beans, and lentils.  ?? Cut back on butter or margarine on bread. Instead, you can dip your bread in a small amount of olive oil.  ?? Ask for a side salad or grilled vegetables instead of french fries or chips.  Where can you learn more?  Go to [employer]Wrest[employer].at.  Enter O407 in the search box to learn more about "Learning About the Mediterranean Diet."  Current as of: Apr 17, 2016  Content Version: 11.4  ?? 2006-2017 Healthwise, Incorporated. Care instructions adapted under license by [employer] (which disclaims liability or warranty  for this information). If you have questions about a medical condition or this instruction, always ask your healthcare professional. Healthwise, Incorporated disclaims any warranty or liability for your use of this information.

## 2017-03-29 NOTE — Progress Notes (Signed)
Chief Complaint   Patient presents with   ??? ED Follow-up     [facility]          HPI:  Patient is a 66 year old obese black female with medical problems listed below presents for follow up of Hypertension, Hyperlipidemia, GERD, Hypothyroidism, etc. She has been feeling well and denies CP, SOB, or other complaints today. She is complaint and taking her medications with no adverse effects reported and she has gained 2 pounds in the last month. She was seen and evaluated in the ED last month for chest pain which is currently resolved      Past Medical History:   Diagnosis Date   ??? Anemia    ??? Asthma    ??? Colon polyp    ??? DDD (degenerative disc disease) 02/15/2009   ??? Depression 12/18/2011   ??? Essential hypertension, benign    ??? History of echocardiogram 03/20/2004    EF 70%.  Borderline DDfx.  No significant valvular pathology.   ??? Hypercholesterolemia    ??? Hypertension    ??? Iron deficiency anemia 3/50/0938   ??? Lichen planus 1/82/9937   ??? Lower extremity venous duplex 10/23/2009    Left leg:  No DVT.     ??? Meralgia paraesthetica 10/22/2013   ??? OA (osteoarthritis of spine) 02/15/2009   ??? OA (osteoarthritis of spine) 02/15/2009   ??? Obesity, unspecified    ??? Other and unspecified hyperlipidemia    ??? Pre-operative cardiovascular examination     For spine surgery   ??? Right sided sciatica    ??? Shortness of breath     Possible asthma, HCVD; less likely CAD (Noted 03/15/09)   ??? Sleep apnea 02/15/2009    uses cpap machine   ??? Thallium stress test  03/20/2004    Partially transient, mod basal & mid anterior defect most c/w artifact; mild anterior ischemia less likely.  Neg EKG on pharm stress test.   ??? Thyroid disease     hypothyroidism     Allergies   Allergen Reactions   ??? Asa-Acetaminophen-Caff-Potass Other (comments)     Bleeding in stomach   ??? Benadryl [Diphenhydramine Hcl] Other (comments)     Muscle jerking   ??? Other Plant, Animal, Environmental Not Reported This Time     Grass, Dust and Mold    ??? Pollen Extracts Not Reported This Time     Current Outpatient Prescriptions   Medication Sig Dispense Refill   ??? latanoprost (XALATAN) 0.005 % ophthalmic solution      ??? atorvastatin (LIPITOR) 40 mg tablet take 1 tablet by mouth once daily 30 Tab 3   ??? montelukast (SINGULAIR) 10 mg tablet TAKE 1 TABLET BY MOUTH AT BEDTIME IF NEEDED 90 Tab 1   ??? gabapentin (NEURONTIN) 300 mg capsule TAKE 1 CAPSULE BY MUUTH EVERY MORNING AND 2 CAPSULES AT BEDTIME 90 Cap 1   ??? multivitamin (ONE A DAY) tablet Take 1 Tab by mouth daily.     ??? valsartan-hydroCHLOROthiazide (DIOVAN-HCT) 160-25 mg per tablet take 1 tablet by mouth daily 90 Tab 2   ??? carvedilol (COREG) 6.25 mg tablet Take 1 Tab by mouth two (2) times daily (with meals). 60 Tab 3   ??? levothyroxine (SYNTHROID) 150 mcg tablet take 1 tablet by mouth once daily BEFORE BREAKFAST 30 Tab 1   ??? omeprazole (PRILOSEC) 40 mg capsule TAKE 1 CAPSULE BY MOUTH ONCE A DAY BEFORE BREAKFAST 30 Cap 5   ??? gabapentin (NEURONTIN) 300 mg capsule  take 1 capsule by mouth every morning and 2 capsules by mouth at bedtime 180 Cap 1   ??? budesonide-formoterol (SYMBICORT) 160-4.5 mcg/actuation HFA inhaler Take 2 Puffs by inhalation two (2) times a day. 1 Inhaler 5   ??? Cholecalciferol, Vitamin D3, (VITAMIN D-3) 5,000 unit Tab Take  by mouth daily.     ??? VARICELLA-ZOSTER VACINE LIVE 19,400 unit/0.65 mL susr injection   0          ROS:  Constitutional: Negative for fever, chills, or fatigue  Neurological: Negative for headache, dizziness, visual disturbance, or loss of consciousness  Respiratory: Negative for SOB, hemoptysis, or wheezing  Cardiovascular: Negative for chest pain, palpitation, or leg swelling  Gastrointestinal: Negative for abdominal pain, nausea, vomiting, diarrhea, blood in stool, melena, or heartburn  Musculoskeletal: Negative for falls          Physical Exam:  Visit Vitals   ??? BP 137/70 (BP 1 Location: Left arm, BP Patient Position: Sitting)   ??? Pulse 95    ??? Temp 98.2 ??F (36.8 ??C) (Oral)   ??? Resp 20   ??? Ht 5\' 2"  (1.575 m)   ??? Wt 197 lb 6.4 oz (89.5 kg)   ??? SpO2 95%   ??? BMI 36.1 kg/m2     General: Obese black female, a & o x 3, afebrile, interacting appropriately, in no acute distress  Skin: warm and dry, no rashes , no bruises  Neck: supple, symmetrical, no thyromegaly  Respiratory: symmetrical chest expansion, lung sounds clear bilaterally, good air entry, good respiratory effort, no wheezes or crackles  Cardiovascular: normal S1S2, regular rate and rhythm, no murmurs, pulses palpable, no thrill, no carotid or abdominal bruits, no JVD  Abdomen: non-distended, normoactive bowel sounds x 4 quadrants, soft, non-tender to palpation  Musculoskeletal: normal ROM on all joints, no swelling or deformity, no perilumbar tenderness, steady gait  Extremity: No edema noted        Assessment/Plan:    ICD-10-CM ICD-9-CM    1. Pre-diabetes R73.03 790.29 Counseled on diet and exercise.   2. Obesity (BMI 30-39.9) E66.9 278.00 I have reviewed/discussed the above normal BMI with the patient.  I have recommended the following interventions: dietary management education, guidance, and counseling, encourage exercise and monitor weight . .     3. HTN (hypertension), benign I10 401.1 Controlled  Continue current meds and she was counseled on low salt diet.   4. Hypercholesteremia E78.00 272.0 She was counseled on low fat diet.   5. Vitamin D deficiency E55.9 268.9 Stable   6. 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 TSH 3RD GENERATION     Standing Status:   Future     Number of Occurrences:   1     Standing Expiration Date:   03/30/2018   ??? T4, FREE     Standing Status:   Future     Number of Occurrences:   1     Standing Expiration Date:   03/30/2018   ??? latanoprost (XALATAN) 0.005 % ophthalmic solution         Additional Notes: Discussed today's diagnosis, treatment plans. Discussed  medication indications and side effects.    Weight Management:Counseled  After Visit Summary: Discussed provided printed patient instructions. Answered questions accordingly.  Follow-up Disposition: In 1 week to review labs  Osie Cheeks, DO, MPH  Internal Medicine

## 2017-03-30 ENCOUNTER — Inpatient Hospital Stay: Admit: 2017-03-30 | Payer: PRIVATE HEALTH INSURANCE | Primary: Internal Medicine

## 2017-03-30 LAB — TSH 3RD GENERATION: TSH: 0.62 u[IU]/mL (ref 0.36–3.74)

## 2017-03-30 LAB — T4, FREE: T4, Free: 1.3 NG/DL (ref 0.7–1.5)

## 2017-04-21 ENCOUNTER — Encounter: Payer: PRIVATE HEALTH INSURANCE | Primary: Internal Medicine

## 2017-04-26 ENCOUNTER — Inpatient Hospital Stay: Admit: 2017-04-26 | Payer: PRIVATE HEALTH INSURANCE | Primary: Internal Medicine

## 2017-04-26 DIAGNOSIS — M545 Low back pain: Secondary | ICD-10-CM

## 2017-04-26 NOTE — Progress Notes (Signed)
PT DAILY TREATMENT NOTE 12-16    Patient Name: Elaine [name]  Date:04/26/2017  DOB: December 20, 1950  _0   Patient DOB Verified  Payor: [employer]IG[employer] / Plan: [facility] [employer] PPO / Product Type: PPO /    In time:521  Out [phone]  Total Treatment Time (min): 45  Visit #: 5 of 8     Treatment Area: Low back pain [M54.5]    SUBJECTIVE  Pain Level (0-10 scale): 0  Any medication changes, allergies to medications, adverse drug reactions, diagnosis change, or new procedure performed?: _1  No    _2  Yes (see summary sheet for update)  Subjective functional status/changes:   _3  No changes reported  Doing alright today    OBJECTIVE    Modality rationale:     Min Type Additional Details    _4  Estim:  _5 Unatt       _6 IFC  _7 Premod                        _8 Other:  _9 w/ice   _10 w/heat  Position:  Location:    _11  Estim: _12 Att    _13 TENS instruct  _14 NMES                    _15 Other:  _16 w/US   _17 w/ice   _18 w/heat  Position:  Location:    _19   Traction: _20  Cervical       _21 Lumbar                       _22  Prone          _23 Supine                       _24 Intermittent   _25 Continuous Lbs:  _26  before manual  _27  after manual    _28   Ultrasound: _29 Continuous   _30  Pulsed                           _31 1MHz   _32 3MHz W/cm2:  Location:    _33   Iontophoresis with dexamethasone         Location: _34  Take home patch   _35  In clinic    _36   Ice     _37   heat  _38   Ice massage  _39   Laser   _40   Anodyne Position:  Location:    _41   Laser with stim  _42   Other:  Position:  Location:    _43   Vasopneumatic Device Pressure:       _44  lo _45  med _46  hi   Temperature: _47  lo _48  med _49  hi   _50  Skin assessment post-treatment:  _51 intact _52 redness- no adverse reaction    _53 redness ??? adverse reaction:      min _54 Eval                  _55 Re-Eval       45 min Therapeutic Exercise:  _56  See flow sheet :   Rationale: increase ROM, increase strength, improve coordination, improve balance and increase proprioception to improve the patient???s ability to aid with increase tolerance to ADLs and activities      min Therapeutic Activity:  _57   See flow sheet :   Rationale:   to improve the patient???s ability to       min Neuromuscular Re-education:  _58   See flow sheet :   Rationale:   to improve the patient???s ability to  min Manual Therapy:     Rationale:  to      min Gait Training:  ___ feet with ___ device on level surfaces with ___ level of assist   Rationale:          With   _0  TE   _1  TA   _2  neuro   _3  other: Patient Education: _4  Review HEP    _5  Progressed/Changed HEP based on:   _6  positioning   _7  body mechanics   _8  transfers   <GOTLXBWIOMBTDHRC>_1<\/ULAGTXMIWOEHOZYY>_4  heat/ice application    <MGNOIBBCWUGQBVQX>_4<\/HWTUUEKCMKLKJZPH>_15  other:      Other Objective/Functional Measures: VC exercises and tech     Pain Level (0-10 scale) post treatment: 0    ASSESSMENT/Changes in Function: tolerated well.    Patient will continue to benefit from skilled PT services to modify and progress therapeutic interventions, address functional mobility deficits, address ROM deficits, address strength deficits, analyze and address soft tissue restrictions, analyze and cue movement patterns, analyze and modify body mechanics/ergonomics, assess and modify postural abnormalities and instruct in home and community integration to attain remaining goals.     _11   See Plan of Care  _12   See progress note/recertification  <AVWPVXYIAXKPVVZS>_8<\/OLMBEMLJQGBEEFEO>_71   See Discharge Summary         Progress towards goals / Updated goals:   Short Term Goals:??To be accomplished in 5??treatments:  1. ??Pt will be compliant and independent with HEP in order to facilitate PT sessions and aid with self management  ????????????????????????????????????????????Eval Status: ??Initiated  ????????????????????????????????????????????Current Status: MET, compliant. ??02/22/17   04/26/17  2. ??Pt to tolerate 30 min or more of TE and/or Interventions w/o increased s/s  ????????????????????????????????????????????Eval Status: ??Initiated  ????????????????????????????????????????????Current Status: MET??02/23/16??   Met 04/26/17  Long Term Goals:??To be accomplished in 10??treatments:  1. ??Pt will report 50% improvement or better with involvement to show a  significant increase in function  ????????????????????????????????????????????Eval Status: ??Initiated  ????????????????????????????????????????????Current Status: progressing, 35% improvement 02/22/2017  2. 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  ????????????????????????????????????????????Eval Status: ??Initiated  ????????????????????????????????????????????Current Status: MET, 0/10 at best 02/22/2017    0  04/26/17  3. ??Pt will have improved upright posturing to -10* or better to show improved postural alignment and increased core stability to aid with walking tolerance ????  ????????????????????????????????????????????Eval Status: ??Initiated  ????????????????????????????????????????????Current Status: ??MET 15 min standing tolerance on a good day??02/22/2017  4. ??Pt will negotiate 1 flight of steps x 2 Reps to transition up and down her stairs at home  ????????????????????????????????????????????Eval Status: ??Difficulty with negotiating stairs  ????????????????????????????????????????????Current Status: progressing, able to negotiate 1 flight at this time??02/22/2017  5. ??Pt will improve FOTO score to 56 in 12 visits??to show significant improvement in function for progress to little difficulty with usual daily work tasks  ????????????????????????????????????????????Eval Status: 47  ????????????????????????????????????????????Current Status:??progressing but not met; 52 points on??02/18/17    47 04/26/17  ??    PLAN  _14   Upgrade activities as tolerated     _15   Continue plan of care  _16   Update interventions per flow sheet       _17   Discharge due to:_  _18   Other:_      Forrest Moron, PT 04/26/2017  5:17 PM    Future Appointments  Date Time Provider [place]   04/26/2017 5:30 PM Forrest Moron, PT [facility]   04/30/2017 5:30 PM Butch Penny, PT [facility]   05/05/2017 5:30 PM Crystal Smalls, PTA [facility]  08/02/2017 9:45 AM Carlyle Dolly, MD [place]

## 2017-04-26 NOTE — Progress Notes (Cosign Needed)
[facility] ??? [facility]      [address], [place] [address]  [phone] fax    Discharge Summary    Patient name: Elaine [name] Start of Care: 01/01/17   Referral source: Osie Cheeks, DO DOB: July 06, 1951   Medical/Treatment Diagnosis: Low back pain [M54.5] Onset Date:30 years ago   Prior Hospitalization: see medical history Provider#: [id number]   Medications: Verified on Patient Summary List ??   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 ??Pt will be compliant and independent with HEP in order to facilitate PT sessions and aid with self management  ????????????????????????????????????????????Eval Status: ??Initiated  ????????????????????????????????????????????Current Status: MET, compliant. ??02/22/17   04/26/17  2. ??Pt to tolerate 30 min or more of TE and/or Interventions w/o increased s/s  ????????????????????????????????????????????Eval Status: ??Initiated  ????????????????????????????????????????????Current Status: MET??02/23/16??   Met 04/26/17  Long Term Goals:??To be accomplished in 10??treatments:  1. ??Pt will report 50% improvement or better with involvement to show a significant increase in function  ????????????????????????????????????????????Eval Status: ??Initiated  ????????????????????????????????????????????Current Status: Not met, progressing, 35% improvement 02/22/2017  2. 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  ????????????????????????????????????????????Eval Status: ??Initiated  ????????????????????????????????????????????Current Status: MET, 0/10 at best 02/22/2017    0  04/26/17   3. ??Pt will have improved upright posturing to -10* or better to show improved postural alignment and increased core stability to aid with walking tolerance ????  ????????????????????????????????????????????Eval Status: ??Initiated  ????????????????????????????????????????????Current Status: ??MET 15 min standing tolerance on a good day??02/22/2017  4. ??Pt will negotiate 1 flight of steps x 2 Reps to transition up and down her stairs at home  ????????????????????????????????????????????Eval Status: ??Difficulty with negotiating stairs  ????????????????????????????????????????????Current Status: Not met, progressing, able to negotiate 1 flight at this time??02/22/2017  5. ??Pt will improve FOTO score to 56 in 12 visits??to show significant improvement in function for progress to little difficulty with usual daily work tasks  ????????????????????????????????????????????Eval Status: 47  ????????????????????????????????????????????Current Status:??progressing but not met; 52 points on??02/18/17    47 04/26/17  ??      ASSESSMENT/RECOMMENDATIONS:  '[]'$ Discontinue therapy progressing towards or have reached established goals  '[]'$ Discontinue therapy due to lack of appreciable progress towards goals  '[x]'$ Discontinue therapy due to lack of attendance or compliance  '[x]'$ Other:  Failed to return to complete program    Thank you for this referral.     Butch Penny, PT 09/05/2017 4:19 PM

## 2017-04-26 NOTE — Progress Notes (Signed)
[facility] ??? 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   '[x]'$  met                 '[]'$  not met  '[]'$  progressing 0   3.??Pt will report an increase in walking/standing tolerance to at least 25 mins to improve mobility in the grocery store.    '[]'$  met                 '[]'$  not met  '[x]'$  progressing    4. ??Pt will improve FOTO score to 56 in 12 visits??to show significant improvement in function for progress to little difficulty with usual daily work tasks   '[]'$  met                 '[x]'$  not met  '[]'$  progressing 47     Key functional changes: tolerance to HEP, decreased pain      Problems/ barriers to goal attainment:      Assessment / Recommendations:demonstrates the potential to make further functional gains    Problem List: decrease strength, decrease ADL/ functional abilitiies, decrease activity tolerance, decrease flexibility/ joint mobility and other FOTO 47    Treatment Plan: Therapeutic exercise, Therapeutic activities, Neuromuscular re-education, Physical agent/modality, Manual therapy, Patient education and Home safety training    Patient Goal (s) has been updated and includes: get stronger     Updated Goals to be accomplished in 3 treatments:  1. ??Pt will report 50% improvement or better with involvement to show a significant increase in function  PN: 35% improvement latest report  CURRENT     2. ??Pt will report an increase in walking/standing tolerance to at least 25 mins to improve mobility in the grocery store.   PN:??15 min standing tolerance on a good day??latest report  CURRENT  3. ??Pt will negotiate 1 flight of steps x 2 Reps to transition up and down her stairs at home  PN: able to negotiate 1 flight at this time latest report  CURRENT  5. ??Pt will improve FOTO score to 56 in 12 visits??to show significant improvement in function for progress to little difficulty with usual daily work tasks  PN:47  CURRENT  ??Frequency / Duration: Patient to be seen 2-3 times per  week for 3 treatments     G-Codes (GP)  Mobility  [id number] Current  CK= 40-59%  [id number] Goal  CK= 40-59%     The severity rating is based on clinical judgment and the FOTO score.        Forrest Moron, PT 04/26/2017 6:23 PM

## 2017-04-30 ENCOUNTER — Encounter: Payer: PRIVATE HEALTH INSURANCE | Primary: Internal Medicine

## 2017-05-05 ENCOUNTER — Encounter: Payer: PRIVATE HEALTH INSURANCE | Primary: Internal Medicine

## 2017-05-29 ENCOUNTER — Encounter

## 2017-05-31 MED ORDER — OMEPRAZOLE 40 MG CAP, DELAYED RELEASE
40 mg | ORAL_CAPSULE | ORAL | 3 refills | Status: DC
Start: 2017-05-31 — End: 2017-11-05

## 2017-05-31 MED ORDER — CARVEDILOL 6.25 MG TAB
6.25 mg | ORAL_TABLET | ORAL | 3 refills | Status: DC
Start: 2017-05-31 — End: 2018-03-07

## 2017-05-31 NOTE — Telephone Encounter (Signed)
Omeprazole refilled and sent to pharmacy. Please call and notify pt.

## 2017-06-08 ENCOUNTER — Encounter

## 2017-06-08 MED ORDER — GABAPENTIN 300 MG CAP
300 mg | ORAL_CAPSULE | ORAL | 1 refills | Status: DC
Start: 2017-06-08 — End: 2017-08-11

## 2017-06-08 NOTE — Telephone Encounter (Signed)
Gabapentin refilled and sent to pharmacy. Please call and notify pt.

## 2017-06-10 MED ORDER — LEVOTHYROXINE 150 MCG TAB
150 mcg | ORAL_TABLET | ORAL | 1 refills | Status: DC
Start: 2017-06-10 — End: 2018-03-14

## 2017-06-22 MED ORDER — LEVOTHYROXINE 150 MCG TAB
150 mcg | ORAL_TABLET | ORAL | 0 refills | Status: DC
Start: 2017-06-22 — End: 2017-09-06

## 2017-06-22 NOTE — Telephone Encounter (Signed)
Synthroid refilled and sent to pharmacy. Please call and notify pt that no subsequent refills will be granted unless she makes a f/u appt.

## 2017-06-28 ENCOUNTER — Ambulatory Visit: Payer: PRIVATE HEALTH INSURANCE | Primary: Internal Medicine

## 2017-06-29 DIAGNOSIS — E119 Type 2 diabetes mellitus without complications: Secondary | ICD-10-CM | POA: Diagnosis not present

## 2017-06-29 DIAGNOSIS — K219 Gastro-esophageal reflux disease without esophagitis: Secondary | ICD-10-CM | POA: Diagnosis not present

## 2017-06-29 DIAGNOSIS — Z1211 Encounter for screening for malignant neoplasm of colon: Secondary | ICD-10-CM | POA: Diagnosis not present

## 2017-06-29 DIAGNOSIS — E785 Hyperlipidemia, unspecified: Secondary | ICD-10-CM | POA: Diagnosis not present

## 2017-06-29 DIAGNOSIS — I10 Essential (primary) hypertension: Secondary | ICD-10-CM | POA: Diagnosis not present

## 2017-06-29 DIAGNOSIS — M109 Gout, unspecified: Secondary | ICD-10-CM | POA: Diagnosis not present

## 2017-07-01 ENCOUNTER — Encounter

## 2017-07-01 ENCOUNTER — Inpatient Hospital Stay: Admit: 2017-07-01 | Payer: PRIVATE HEALTH INSURANCE | Primary: Internal Medicine

## 2017-07-01 ENCOUNTER — Inpatient Hospital Stay: Payer: PRIVATE HEALTH INSURANCE | Attending: Internal Medicine | Primary: Internal Medicine

## 2017-07-01 ENCOUNTER — Ambulatory Visit

## 2017-07-01 DIAGNOSIS — Z1231 Encounter for screening mammogram for malignant neoplasm of breast: Secondary | ICD-10-CM

## 2017-07-31 ENCOUNTER — Encounter

## 2017-08-01 MED ORDER — ATORVASTATIN 40 MG TAB
40 mg | ORAL_TABLET | ORAL | 3 refills | Status: DC
Start: 2017-08-01 — End: 2017-11-30

## 2017-08-01 NOTE — Telephone Encounter (Signed)
Lipitor refilled and sent to pharmacy. Please call and notify pt

## 2017-08-02 ENCOUNTER — Encounter: Attending: Specialist | Primary: Internal Medicine

## 2017-08-12 MED ORDER — GABAPENTIN 300 MG CAP
300 mg | ORAL_CAPSULE | ORAL | 1 refills | Status: DC
Start: 2017-08-12 — End: 2017-11-13

## 2017-08-12 NOTE — Telephone Encounter (Signed)
Gabapentin refilled and sent to pharmacy. Please call and notify pt.

## 2017-08-18 ENCOUNTER — Ambulatory Visit
Admit: 2017-08-18 | Discharge: 2017-08-18 | Payer: PRIVATE HEALTH INSURANCE | Attending: Internal Medicine | Primary: Internal Medicine

## 2017-08-18 DIAGNOSIS — J029 Acute pharyngitis, unspecified: Secondary | ICD-10-CM

## 2017-08-18 DIAGNOSIS — R413 Other amnesia: Secondary | ICD-10-CM

## 2017-08-18 MED ORDER — HYDROCODONE 10 MG-CHLORPHENIRAMINE 8 MG/5 ML ORAL SUSP EXTEND.REL 12HR
10-8 mg/5 mL | Freq: Two times a day (BID) | ORAL | 0 refills | Status: DC | PRN
Start: 2017-08-18 — End: 2018-03-07

## 2017-08-18 MED ORDER — BENZOCAINE-MENTHOL 6 MG-10 MG LOZENGES
6-10 mg | LOZENGE | 0 refills | Status: DC | PRN
Start: 2017-08-18 — End: 2018-03-07

## 2017-08-18 NOTE — Patient Instructions (Addendum)
DASH Diet: Care Instructions  Your Care Instructions    The DASH diet is an eating plan that can help lower your blood pressure. DASH stands for Dietary Approaches to Stop Hypertension. Hypertension is high blood pressure.  The DASH diet focuses on eating foods that are high in calcium, potassium, and magnesium. These nutrients can lower blood pressure. The foods that are highest in these nutrients are fruits, vegetables, low-fat dairy products, nuts, seeds, and legumes. But taking calcium, potassium, and magnesium supplements instead of eating foods that are high in those nutrients does not have the same effect. The DASH diet also includes whole grains, fish, and poultry.  The DASH diet is one of several lifestyle changes your doctor may recommend to lower your high blood pressure. Your doctor may also want you to decrease the amount of sodium in your diet. Lowering sodium while following the DASH diet can lower blood pressure even further than just the DASH diet alone.  Follow-up care is a key part of your treatment and safety. Be sure to make and go to all appointments, and call your doctor if you are having problems. It's also a good idea to know your test results and keep a list of the medicines you take.  How can you care for yourself at home?  Following the DASH diet  ?? Eat 4 to 5 servings of fruit each day. A serving is 1 medium-sized piece of fruit, ?? cup chopped or canned fruit, 1/4 cup dried fruit, or 4 ounces (?? cup) of fruit juice. Choose fruit more often than fruit juice.  ?? Eat 4 to 5 servings of vegetables each day. A serving is 1 cup of lettuce or raw leafy vegetables, ?? cup of chopped or cooked vegetables, or 4 ounces (?? cup) of vegetable juice. Choose vegetables more often than vegetable juice.  ?? Get 2 to 3 servings of low-fat and fat-free dairy each day. A serving is 8 ounces of milk, 1 cup of yogurt, or 1 ?? ounces of cheese.   ?? Eat 6 to 8 servings of grains each day. A serving is 1 slice of bread, 1 ounce of dry cereal, or ?? cup of cooked rice, pasta, or cooked cereal. Try to choose whole-grain products as much as possible.  ?? Limit lean meat, poultry, and fish to 2 servings each day. A serving is 3 ounces, about the size of a deck of cards.  ?? Eat 4 to 5 servings of nuts, seeds, and legumes (cooked dried beans, lentils, and split peas) each week. A serving is 1/3 cup of nuts, 2 tablespoons of seeds, or ?? cup of cooked beans or peas.  ?? Limit fats and oils to 2 to 3 servings each day. A serving is 1 teaspoon of vegetable oil or 2 tablespoons of salad dressing.  ?? Limit sweets and added sugars to 5 servings or less a week. A serving is 1 tablespoon jelly or jam, ?? cup sorbet, or 1 cup of lemonade.  ?? Eat less than 2,300 milligrams (mg) of sodium a day. If you limit your sodium to 1,500 mg a day, you can lower your blood pressure even more.  Tips for success  ?? Start small. Do not try to make dramatic changes to your diet all at once. You might feel that you are missing out on your favorite foods and then be more likely to not follow the plan. Make small changes, and stick with them. Once those changes become habit, add a   few more changes.  ?? Try some of the following:  ?? Make it a goal to eat a fruit or vegetable at every meal and at snacks. This will make it easy to get the recommended amount of fruits and vegetables each day.  ?? Try yogurt topped with fruit and nuts for a snack or healthy dessert.  ?? Add lettuce, tomato, cucumber, and onion to sandwiches.  ?? Combine a ready-made pizza crust with low-fat mozzarella cheese and lots of vegetable toppings. Try using tomatoes, squash, spinach, broccoli, carrots, cauliflower, and onions.  ?? Have a variety of cut-up vegetables with a low-fat dip as an appetizer instead of chips and dip.  ?? Sprinkle sunflower seeds or chopped almonds over salads. Or try adding  chopped walnuts or almonds to cooked vegetables.  ?? Try some vegetarian meals using beans and peas. Add garbanzo or kidney beans to salads. Make burritos and tacos with mashed pinto beans or black beans.  Where can you learn more?  Go to http://www.healthwise.net/GoodHelpConnections.  Enter H967 in the search box to learn more about "DASH Diet: Care Instructions."  Current as of: November 11, 2016  Content Version: 11.7  ?? 2006-2018 [employer]. Care instructions adapted under license by [employer] (which disclaims liability or warranty for this information). If you have questions about a medical condition or this instruction, always ask your healthcare professional. Healthwise, Incorporated disclaims any warranty or liability for your use of this information.       Learning About the Mediterranean Diet  What is the Mediterranean diet?    The Mediterranean diet is a style of eating rather than a diet plan. It features foods eaten in [place], [place], southern [place] and [place], and other countries along the [place] Sea. It emphasizes eating foods like fish, fruits, vegetables, beans, high-fiber breads and whole grains, nuts, and olive oil. This style of eating includes limited red meat, cheese, and sweets.  Why choose the Mediterranean diet?  A Mediterranean-style diet may improve heart health. It contains more fat than other heart-healthy diets. But the fats are mainly from nuts, unsaturated oils (such as fish oils and olive oil), and certain nut or seed oils (such as canola, soybean, or flaxseed oil). These fats may help protect the heart and blood vessels.  How can you get started on the Mediterranean diet?  Here are some things you can do to switch to a more Mediterranean way of eating.  What to eat  ?? Eat a variety of fruits and vegetables each day, such as grapes, blueberries, tomatoes, broccoli, peppers, figs, olives, spinach, eggplant, beans, lentils, and chickpeas.   ?? Eat a variety of whole-grain foods each day, such as oats, brown rice, and whole wheat bread, pasta, and couscous.  ?? Eat fish at least 2 times a week. Try tuna, salmon, mackerel, lake trout, herring, or sardines.  ?? Eat moderate amounts of low-fat dairy products, such as milk, cheese, or yogurt.  ?? Eat moderate amounts of poultry and eggs.  ?? Choose healthy (unsaturated) fats, such as nuts, olive oil, and certain nut or seed oils like canola, soybean, and flaxseed.  ?? Limit unhealthy (saturated) fats, such as butter, palm oil, and coconut oil. And limit fats found in animal products, such as meat and dairy products made with whole milk. Try to eat red meat only a few times a month in very small amounts.  ?? Limit sweets and desserts to only a few times a week. This includes sugar-sweetened drinks   like soda.  The Mediterranean diet may also include red wine with your meal-1 glass each day for women and up to 2 glasses a day for men.  Tips for eating at home  ?? Use herbs, spices, garlic, lemon zest, and citrus juice instead of salt to add flavor to foods.  ?? Add avocado slices to your sandwich instead of bacon.  ?? Have fish for lunch or dinner instead of red meat. Brush the fish with olive oil, and broil or grill it.  ?? Sprinkle your salad with seeds or nuts instead of cheese.  ?? Cook with olive or canola oil instead of butter or oils that are high in saturated fat.  ?? Switch from 2% milk or whole milk to 1% or fat-free milk.  ?? Dip raw vegetables in a vinaigrette dressing or hummus instead of dips made from mayonnaise or sour cream.  ?? Have a piece of fruit for dessert instead of a piece of cake. Try baked apples, or have some dried fruit.  Tips for eating out  ?? Try broiled, grilled, baked, or poached fish instead of having it fried or breaded.  ?? Ask your server to have your meals prepared with olive oil instead of butter.  ?? Order dishes made with marinara sauce or sauces made from olive oil.  Avoid sauces made from cream or mayonnaise.  ?? Choose whole-grain breads, whole wheat pasta and pizza crust, brown rice, beans, and lentils.  ?? Cut back on butter or margarine on bread. Instead, you can dip your bread in a small amount of olive oil.  ?? Ask for a side salad or grilled vegetables instead of french fries or chips.  Where can you learn more?  Go to [email or web address].at.  Enter O407 in the search box to learn more about "Learning About the Mediterranean Diet."  Current as of: Apr 17, 2016  Content Version: 11.7  ?? 2006-2018 [employer]. Care instructions adapted under license by [employer] (which disclaims liability or warranty for this information). If you have questions about a medical condition or this instruction, always ask your healthcare professional. [place] any warranty or liability for your use of this information.       Cough: Care Instructions  Your Care Instructions    A cough is your body's response to something that bothers your throat or airways. Many things can cause a cough. You might cough because of a cold or the flu, bronchitis, or asthma. Smoking, postnasal drip, allergies, and stomach acid that backs up into your throat also can cause coughs.  A cough is a symptom, not a disease. Most coughs stop when the cause, such as a cold, goes away. You can take a few steps at home to cough less and feel better.  Follow-up care is a key part of your treatment and safety. Be sure to make and go to all appointments, and call your doctor if you are having problems. It's also a good idea to know your test results and keep a list of the medicines you take.  How can you care for yourself at home?  ?? Drink lots of water and other fluids. This helps thin the mucus and soothes a dry or sore throat. Honey or lemon juice in hot water or tea may ease a dry cough.  ?? Take cough medicine as directed by your doctor.   ?? Prop up your head on pillows to help you breathe and ease a dry cough.  ??  Try cough drops to soothe a dry or sore throat. Cough drops don't stop a cough. Medicine-flavored cough drops are no better than candy-flavored drops or hard candy.  ?? Do not smoke. Avoid secondhand smoke. If you need help quitting, talk to your doctor about stop-smoking programs and medicines. These can increase your chances of quitting for good.  When should you call for help?  Call 911 anytime you think you may need emergency care. For example, call if:  ?? ?? You have severe trouble breathing.   ??Call your doctor now or seek immediate medical care if:  ?? ?? You cough up blood.   ?? ?? You have new or worse trouble breathing.   ?? ?? You have a new or higher fever.   ?? ?? You have a new rash.   ??Watch closely for changes in your health, and be sure to contact your doctor if:  ?? ?? You cough more deeply or more often, especially if you notice more mucus or a change in the color of your mucus.   ?? ?? You have new symptoms, such as a sore throat, an earache, or sinus pain.   ?? ?? You do not get better as expected.   Where can you learn more?  Go to [email or web address]at.  Enter D279 in the search box to learn more about "Cough: Care Instructions."  Current as of: November 11, 2016  Content Version: 11.7  ?? 2006-2018 Health[employer], [employer]. Care instructions adapted under license by [employer] (which disclaims liability or warranty for this information). If you have questions about a medical condition or this instruction, always ask your healthcare professional. [place] any warranty or liability for your use of this information.       Sore Throat: Care Instructions  Your Care Instructions    Infection by bacteria or a virus causes most sore throats. Cigarette smoke, dry air, air pollution, allergies, and yelling can also cause a  sore throat. Sore throats can be painful and annoying. Fortunately, most sore throats go away on their own. If you have a bacterial infection, your doctor may prescribe antibiotics.  Follow-up care is a key part of your treatment and safety. Be sure to make and go to all appointments, and call your doctor if you are having problems. It's also a good idea to know your test results and keep a list of the medicines you take.  How can you care for yourself at home?  ?? If your doctor prescribed antibiotics, take them as directed. Do not stop taking them just because you feel better. You need to take the full course of antibiotics.  ?? Gargle with warm salt water once an hour to help reduce swelling and relieve discomfort. Use 1 teaspoon of salt mixed in 1 cup of warm water.  ?? Take an over-the-counter pain medicine, such as acetaminophen (Tylenol), ibuprofen (Advil, Motrin), or naproxen (Aleve). Read and follow all instructions on the label.  ?? Be careful when taking over-the-counter cold or flu medicines and Tylenol at the same time. Many of these medicines have acetaminophen, which is Tylenol. Read the labels to make sure that you are not taking more than the recommended dose. Too much acetaminophen (Tylenol) can be harmful.  ?? Drink plenty of fluids. Fluids may help soothe an irritated throat. Hot fluids, such as tea or soup, may help decrease throat pain.  ?? Use over-the-counter throat lozenges to soothe pain. Regular cough drops or hard candy may also  help. These should not be given to young children because of the risk of choking.  ?? Do not smoke or allow others to smoke around you. If you need help quitting, talk to your doctor about stop-smoking programs and medicines. These can increase your chances of quitting for good.  ?? Use a vaporizer or humidifier to add moisture to your bedroom. Follow the directions for cleaning the machine.  When should you call for help?   Call your doctor now or seek immediate medical care if:  ?? ?? You have new or worse trouble swallowing.   ?? ?? Your sore throat gets much worse on one side.   ??Watch closely for changes in your health, and be sure to contact your doctor if you do not get better as expected.  Where can you learn more?  Go to [email or web address].at.  Enter [phone] in the search box to learn more about "Sore Throat: Care Instructions."  Current as of: Apr 17, 2016  Content Version: 11.7  ?? 2006-2018 Health[employer], [employer]. Care instructions adapted under license by [employer] (which disclaims liability or warranty for this information). If you have questions about a medical condition or this instruction, always ask your healthcare professional. [place] any warranty or liability for your use of this information.

## 2017-08-18 NOTE — Progress Notes (Signed)
Chief Complaint   Patient presents with   ??? Sore Throat     x 1 week   ??? Memory Loss     patient is concerned about memeory loss         HPI:  Patient is a 66 year old African American female with medical problems listed below presents for an acute visit with sore throat. Sore throat started two weeks ago and associated is cough that is non productive. However, she denies fever, chills, CP, SOB, etc. She also endorsed memory loss that started 6 months ago as she has been more forgetful lately and sometimes suddenly cannot remember what she wants to say when discussing with someone.         Past Medical History:   Diagnosis Date   ??? Anemia    ??? Asthma    ??? Colon polyp    ??? DDD (degenerative disc disease) 02/15/2009   ??? Depression 12/18/2011   ??? Essential hypertension, benign    ??? History of echocardiogram 03/20/2004    EF 70%.  Borderline DDfx.  No significant valvular pathology.   ??? Hypercholesterolemia    ??? Hypertension    ??? Iron deficiency anemia 06/01/9484   ??? Lichen planus 4/62/7035   ??? Lower extremity venous duplex 10/23/2009    Left leg:  No DVT.     ??? Meralgia paraesthetica 10/22/2013   ??? OA (osteoarthritis of spine) 02/15/2009   ??? OA (osteoarthritis of spine) 02/15/2009   ??? Obesity, unspecified    ??? Other and unspecified hyperlipidemia    ??? Pre-operative cardiovascular examination     For spine surgery   ??? Right sided sciatica    ??? Shortness of breath     Possible asthma, HCVD; less likely CAD (Noted 03/15/09)   ??? Sleep apnea 02/15/2009    uses cpap machine   ??? Thallium stress test  03/20/2004    Partially transient, mod basal & mid anterior defect most c/w artifact; mild anterior ischemia less likely.  Neg EKG on pharm stress test.   ??? Thyroid disease     hypothyroidism     Allergies   Allergen Reactions   ??? Asa-Acetaminophen-Caff-Potass Other (comments)     Bleeding in stomach   ??? Benadryl [Diphenhydramine Hcl] Other (comments)     Muscle jerking   ??? Other Plant, Animal, Environmental Not Reported This Time      Grass, Dust and Mold   ??? Pollen Extracts Not Reported This Time     Current Outpatient Prescriptions   Medication Sig Dispense Refill   ??? gabapentin (NEURONTIN) 300 mg capsule take 1 capsule by mouth every morning and 2 capsules by mouth at bedtime 90 Cap 1   ??? atorvastatin (LIPITOR) 40 mg tablet take 1 tablet by mouth once daily 30 Tab 3   ??? levothyroxine (SYNTHROID) 150 mcg tablet TAKE 1 TABLET BY MOUTH ONCE DAILY BEFORE BREAKFAST 30 Tab 0   ??? levothyroxine (SYNTHROID) 150 mcg tablet TAKE 1 TABLET BY MOUTH ONCE DAILY BEFORE BREAKFAST 30 Tab 1   ??? omeprazole (PRILOSEC) 40 mg capsule take 1 capsule by mouth once daily before BREAKFAST 30 Cap 3   ??? carvedilol (COREG) 6.25 mg tablet TAKE 1 TABLET BY MOUTH TWO TIMES A DAY WITH MEALS 60 Tab 3   ??? latanoprost (XALATAN) 0.005 % ophthalmic solution      ??? montelukast (SINGULAIR) 10 mg tablet TAKE 1 TABLET BY MOUTH AT BEDTIME IF NEEDED 90 Tab 1   ??? multivitamin (  ONE A DAY) tablet Take 1 Tab by mouth daily.     ??? valsartan-hydroCHLOROthiazide (DIOVAN-HCT) 160-25 mg per tablet take 1 tablet by mouth daily 90 Tab 2   ??? gabapentin (NEURONTIN) 300 mg capsule take 1 capsule by mouth every morning and 2 capsules by mouth at bedtime 180 Cap 1   ??? VARICELLA-ZOSTER VACINE LIVE 19,400 unit/0.65 mL susr injection   0   ??? budesonide-formoterol (SYMBICORT) 160-4.5 mcg/actuation HFA inhaler Take 2 Puffs by inhalation two (2) times a day. 1 Inhaler 5   ??? Cholecalciferol, Vitamin D3, (VITAMIN D-3) 5,000 unit Tab Take  by mouth daily.            ROS:  Pertinent as in HPI        Physical Exam:  Visit Vitals   ??? BP 147/61   ??? Pulse 89   ??? Temp 98.9 ??F (37.2 ??C) (Oral)   ??? Resp 18   ??? Ht 5\' 2"  (1.575 m)   ??? Wt 197 lb 6.4 oz (89.5 kg)   ??? SpO2 95%   ??? BMI 36.1 kg/m2     General: a & o x 3, afebrile, interacting appropriately, in no acute distress  Skin: warm and dry, no rashes , no bruises  Neck: supple, symmetrical, no thyromegaly   Respiratory: symmetrical chest expansion, lung sounds clear bilaterally, good air entry, good respiratory effort, no wheezes or crackles  Cardiovascular: normal S1S2, regular rate and rhythm, no murmurs, pulses palpable, no thrill, no carotid or abdominal bruits, no JVD  Abdomen: non-distended, normoactive bowel sounds x 4 quadrants, soft, non-tender to palpation  Musculoskeletal: normal ROM on all joints, no swelling or deformity, no perilumbar tenderness, steady gait  Extremity: No edema noted  Neuro: CN 2 to 12 intact         Assessment/Plan:    ICD-10-CM ICD-9-CM    1. Sore throat J02.9 462 Benzocaine-Menthol (CHLORASEPTIC SORE THROAT) 6-10 mg lozg   2. Cough R05 786.2 chlorpheniramine-HYDROcodone (TUSSIONEX) 10-8 mg/5 mL suspension   3. Memory problem R41.3 780.93 TSH 3RD GENERATION      VITAMIN B12      CT HEAD WO CONT         Orders Placed This Encounter   ??? CT HEAD WO CONT     Standing Status:   Future     Standing Expiration Date:   09/17/2018     Order Specific Question:   Is Patient Allergic to Contrast Dye?     Answer:   Unknown   ??? TSH 3RD GENERATION     Standing Status:   Future     Standing Expiration Date:   08/19/2018   ??? VITAMIN B12     Standing Status:   Future     Standing Expiration Date:   08/19/2018   ??? chlorpheniramine-HYDROcodone (TUSSIONEX) 10-8 mg/5 mL suspension     Sig: Take 5 mL by mouth every twelve (12) hours as needed for Cough. Max Daily Amount: 10 mL.     Dispense:  240 mL     Refill:  0   ??? Benzocaine-Menthol (CHLORASEPTIC SORE THROAT) 6-10 mg lozg     Sig: 1 Lozenge by Mucous Membrane route as needed.     Dispense:  30 Lozenge     Refill:  0           Additional Notes: Discussed today's diagnosis, treatment plans. Discussed medication indications and side effects.    Weight Management:Counseled  After Visit Summary: Discussed provided  printed patient instructions. Answered questions accordingly.  Follow-up Disposition: In 1 week to review labs and CT Head results           Osie Cheeks, DO, MPH  Internal Medicine        Total time spent for today's visit was 25 minutes with greater than 50% of time spent involved in counseling and coordination of care as outlined above.

## 2017-08-18 NOTE — Progress Notes (Signed)
Elaine [name] is a  66 y.o. female presents today for office visit for a sore throat and concerned of memory loss. Patient denies chills, body aches and night sweats.    1. Have you been to the ER, urgent care clinic or hospitalized since your last visit? NO    2. Have you seen or consulted any other health care providers outside of the [facility] since your last visit (Include any pap smears or colon screening)? NO

## 2017-08-19 ENCOUNTER — Inpatient Hospital Stay: Admit: 2017-08-19 | Payer: PRIVATE HEALTH INSURANCE | Primary: Internal Medicine

## 2017-08-19 LAB — TSH 3RD GENERATION: TSH: 0.08 u[IU]/mL — ABNORMAL LOW (ref 0.36–3.74)

## 2017-08-19 LAB — VITAMIN B12: Vitamin B12: 1539 pg/mL — ABNORMAL HIGH (ref 211–911)

## 2017-08-23 ENCOUNTER — Ambulatory Visit: Payer: PRIVATE HEALTH INSURANCE | Primary: Internal Medicine

## 2017-08-23 NOTE — Telephone Encounter (Signed)
Patient calling she went to get her CT done and stated she was told her insurance company stated she didn't need a CT. Patient would like a call back regarding her CT

## 2017-08-24 NOTE — Telephone Encounter (Signed)
Pt wanted to know if she still needs to come to tomorrows appointment due to her not having a CT Scan done yet.  She would like a call back this evening.

## 2017-08-24 NOTE — Telephone Encounter (Signed)
Please verify if PA was done for insurance and if insurance denied test.

## 2017-08-24 NOTE — Telephone Encounter (Signed)
Please call pt and advise her that she needs CT Head to evaluate her memory problems and please also call and advise her insurance of this. Thanks.

## 2017-08-25 ENCOUNTER — Encounter: Primary: Internal Medicine

## 2017-08-25 ENCOUNTER — Encounter: Attending: Internal Medicine | Primary: Internal Medicine

## 2017-08-25 NOTE — Telephone Encounter (Signed)
No need to come in today and may make another appt once her CT is done.

## 2017-08-25 NOTE — Telephone Encounter (Addendum)
Called patient unable to leave name and call back number.  The call was  fro her to cancel todays appointment because her CT scan has not been performed yet.

## 2017-09-05 MED ORDER — MONTELUKAST 10 MG TAB
10 mg | ORAL_TABLET | ORAL | 1 refills | Status: DC
Start: 2017-09-05 — End: 2018-03-14

## 2017-09-05 NOTE — Telephone Encounter (Signed)
Singulair refilled and sent to pharmacy. Please call and notify pt.

## 2017-09-07 MED ORDER — LEVOTHYROXINE 150 MCG TAB
150 mcg | ORAL_TABLET | ORAL | 3 refills | Status: DC
Start: 2017-09-07 — End: 2018-03-07

## 2017-09-07 NOTE — Telephone Encounter (Signed)
Patient stated that Insurance company called [place] CT and stated that PCP should order different test before ordering a CT scan.  Patient states that she does not remember what test they stated.  Patient would like a call back.

## 2017-09-07 NOTE — Telephone Encounter (Signed)
Synthroid refilled and sent to pharmacy. Please call and notify pt.

## 2017-09-08 NOTE — Telephone Encounter (Signed)
Pt has memory problems and needs to have CT Head without contrast done to evaluate.

## 2017-10-18 NOTE — Telephone Encounter (Signed)
Relayed Dr's note to patient

## 2017-10-18 NOTE — Telephone Encounter (Signed)
Spoke to [place] to call pt regarding my previous message.

## 2017-10-18 NOTE — Telephone Encounter (Signed)
Pt is calling in b/c she in unsure of her next step . She was scheduled for a CT Scan but her insurance denied saying the provider needs to try something else.  She has been trying to get in touch with someone to speak with her to let her know what to do and is unable to get in touch with anyone.  She wants to speak with the doctor.   Please advise.   [phone]

## 2017-10-18 NOTE — Telephone Encounter (Signed)
Please call and advise pt to go to the ED if she is still having memory problems or other associated symptoms like headache, etc and CT Head will be done in the ED at that time and needs no prior authorization.

## 2017-11-05 ENCOUNTER — Encounter

## 2017-11-06 MED ORDER — OMEPRAZOLE 40 MG CAP, DELAYED RELEASE
40 mg | ORAL_CAPSULE | ORAL | 3 refills | Status: DC
Start: 2017-11-06 — End: 2018-12-23

## 2017-11-06 NOTE — Telephone Encounter (Signed)
Medication refilled and sent to the pharmacy.  Please call and notify patient.

## 2017-11-10 ENCOUNTER — Ambulatory Visit
Admit: 2017-11-10 | Discharge: 2017-11-10 | Payer: PRIVATE HEALTH INSURANCE | Attending: Internal Medicine | Primary: Internal Medicine

## 2017-11-10 DIAGNOSIS — R7303 Prediabetes: Secondary | ICD-10-CM

## 2017-11-10 NOTE — Patient Instructions (Addendum)
DASH Diet: Care Instructions  Your Care Instructions    The DASH diet is an eating plan that can help lower your blood pressure. DASH stands for Dietary Approaches to Stop Hypertension. Hypertension is high blood pressure.  The DASH diet focuses on eating foods that are high in calcium, potassium, and magnesium. These nutrients can lower blood pressure. The foods that are highest in these nutrients are fruits, vegetables, low-fat dairy products, nuts, seeds, and legumes. But taking calcium, potassium, and magnesium supplements instead of eating foods that are high in those nutrients does not have the same effect. The DASH diet also includes whole grains, fish, and poultry.  The DASH diet is one of several lifestyle changes your doctor may recommend to lower your high blood pressure. Your doctor may also want you to decrease the amount of sodium in your diet. Lowering sodium while following the DASH diet can lower blood pressure even further than just the DASH diet alone.  Follow-up care is a key part of your treatment and safety. Be sure to make and go to all appointments, and call your doctor if you are having problems. It's also a good idea to know your test results and keep a list of the medicines you take.  How can you care for yourself at home?  Following the DASH diet  ?? Eat 4 to 5 servings of fruit each day. A serving is 1 medium-sized piece of fruit, ?? cup chopped or canned fruit, 1/4 cup dried fruit, or 4 ounces (?? cup) of fruit juice. Choose fruit more often than fruit juice.  ?? Eat 4 to 5 servings of vegetables each day. A serving is 1 cup of lettuce or raw leafy vegetables, ?? cup of chopped or cooked vegetables, or 4 ounces (?? cup) of vegetable juice. Choose vegetables more often than vegetable juice.  ?? Get 2 to 3 servings of low-fat and fat-free dairy each day. A serving is 8 ounces of milk, 1 cup of yogurt, or 1 ?? ounces of cheese.   ?? Eat 6 to 8 servings of grains each day. A serving is 1 slice of bread, 1 ounce of dry cereal, or ?? cup of cooked rice, pasta, or cooked cereal. Try to choose whole-grain products as much as possible.  ?? Limit lean meat, poultry, and fish to 2 servings each day. A serving is 3 ounces, about the size of a deck of cards.  ?? Eat 4 to 5 servings of nuts, seeds, and legumes (cooked dried beans, lentils, and split peas) each week. A serving is 1/3 cup of nuts, 2 tablespoons of seeds, or ?? cup of cooked beans or peas.  ?? Limit fats and oils to 2 to 3 servings each day. A serving is 1 teaspoon of vegetable oil or 2 tablespoons of salad dressing.  ?? Limit sweets and added sugars to 5 servings or less a week. A serving is 1 tablespoon jelly or jam, ?? cup sorbet, or 1 cup of lemonade.  ?? Eat less than 2,300 milligrams (mg) of sodium a day. If you limit your sodium to 1,500 mg a day, you can lower your blood pressure even more.  Tips for success  ?? Start small. Do not try to make dramatic changes to your diet all at once. You might feel that you are missing out on your favorite foods and then be more likely to not follow the plan. Make small changes, and stick with them. Once those changes become habit, add a   few more changes.  ?? Try some of the following:  ? Make it a goal to eat a fruit or vegetable at every meal and at snacks. This will make it easy to get the recommended amount of fruits and vegetables each day.  ? Try yogurt topped with fruit and nuts for a snack or healthy dessert.  ? Add lettuce, tomato, cucumber, and onion to sandwiches.  ? Combine a ready-made pizza crust with low-fat mozzarella cheese and lots of vegetable toppings. Try using tomatoes, squash, spinach, broccoli, carrots, cauliflower, and onions.  ? Have a variety of cut-up vegetables with a low-fat dip as an appetizer instead of chips and dip.  ? Sprinkle sunflower seeds or chopped almonds over salads. Or try adding  chopped walnuts or almonds to cooked vegetables.  ? Try some vegetarian meals using beans and peas. Add garbanzo or kidney beans to salads. Make burritos and tacos with mashed pinto beans or black beans.  Where can you learn more?  Go to http://www.healthwise.net/GoodHelpConnections.  Enter H967 in the search box to learn more about "DASH Diet: Care Instructions."  Current as of: November 11, 2016  Content Version: 11.8  ?? 2006-2018 [employer]. Care instructions adapted under license by [employer] (which disclaims liability or warranty for this information). If you have questions about a medical condition or this instruction, always ask your healthcare professional. [employer], [employer] disclaims any warranty or liability for your use of this information.         Learning About the Mediterranean Diet  What is the Mediterranean diet?    The Mediterranean diet is a style of eating rather than a diet plan. It features foods eaten in [place], [place], southern [place] and [place], and other countries along the [place] Sea. It emphasizes eating foods like fish, fruits, vegetables, beans, high-fiber breads and whole grains, nuts, and olive oil. This style of eating includes limited red meat, cheese, and sweets.  Why choose the Mediterranean diet?  A Mediterranean-style diet may improve heart health. It contains more fat than other heart-healthy diets. But the fats are mainly from nuts, unsaturated oils (such as fish oils and olive oil), and certain nut or seed oils (such as canola, soybean, or flaxseed oil). These fats may help protect the heart and blood vessels.  How can you get started on the Mediterranean diet?  Here are some things you can do to switch to a more Mediterranean way of eating.  What to eat  ?? Eat a variety of fruits and vegetables each day, such as grapes, blueberries, tomatoes, broccoli, peppers, figs, olives, spinach, eggplant, beans, lentils, and chickpeas.   ?? Eat a variety of whole-grain foods each day, such as oats, brown rice, and whole wheat bread, pasta, and couscous.  ?? Eat fish at least 2 times a week. Try tuna, salmon, mackerel, lake trout, herring, or sardines.  ?? Eat moderate amounts of low-fat dairy products, such as milk, cheese, or yogurt.  ?? Eat moderate amounts of poultry and eggs.  ?? Choose healthy (unsaturated) fats, such as nuts, olive oil, and certain nut or seed oils like canola, soybean, and flaxseed.  ?? Limit unhealthy (saturated) fats, such as butter, palm oil, and coconut oil. And limit fats found in animal products, such as meat and dairy products made with whole milk. Try to eat red meat only a few times a month in very small amounts.  ?? Limit sweets and desserts to only a few times a week. This includes   sugar-sweetened drinks like soda.  The Mediterranean diet may also include red wine with your meal???1 glass each day for women and up to 2 glasses a day for men.  Tips for eating at home  ?? Use herbs, spices, garlic, lemon zest, and citrus juice instead of salt to add flavor to foods.  ?? Add avocado slices to your sandwich instead of bacon.  ?? Have fish for lunch or dinner instead of red meat. Brush the fish with olive oil, and broil or grill it.  ?? Sprinkle your salad with seeds or nuts instead of cheese.  ?? Cook with olive or canola oil instead of butter or oils that are high in saturated fat.  ?? Switch from 2% milk or whole milk to 1% or fat-free milk.  ?? Dip raw vegetables in a vinaigrette dressing or hummus instead of dips made from mayonnaise or sour cream.  ?? Have a piece of fruit for dessert instead of a piece of cake. Try baked apples, or have some dried fruit.  Tips for eating out  ?? Try broiled, grilled, baked, or poached fish instead of having it fried or breaded.  ?? Ask your server to have your meals prepared with olive oil instead of butter.  ?? Order dishes made with marinara sauce or sauces made from olive oil.  Avoid sauces made from cream or mayonnaise.  ?? Choose whole-grain breads, whole wheat pasta and pizza crust, brown rice, beans, and lentils.  ?? Cut back on butter or margarine on bread. Instead, you can dip your bread in a small amount of olive oil.  ?? Ask for a side salad or grilled vegetables instead of french fries or chips.  Where can you learn more?  Go to [employer].  Enter O407 in the search box to learn more about "Learning About the Mediterranean Diet."  Current as of: March 04, 2017  Content Version: 11.8  ?? 2006-2018 [employer]. Care instructions adapted under license by [employer] (which disclaims liability or warranty for this information). If you have questions about a medical condition or this instruction, always ask your healthcare professional. [employer] any warranty or liability for your use of this information.         Prediabetes: Care Instructions  Your Care Instructions    Prediabetes is a warning sign that you are at risk for getting type 2 diabetes. It means that your blood sugar is higher than it should be. The food you eat turns into sugar, which your body uses for energy. Normally, an organ called the pancreas makes insulin, which allows the sugar in your blood to get into your body's cells. But when your body can't use insulin the right way, the sugar doesn't move into cells. It stays in your blood instead. This is called insulin resistance. The buildup of sugar in the blood causes prediabetes.  The good news is that lifestyle changes may help you get your blood sugar back to normal and help you avoid or delay diabetes.  Follow-up care is a key part of your treatment and safety. Be sure to make and go to all appointments, and call your doctor if you are having problems. It's also a good idea to know your test results and keep a list of the medicines you take.  How can you care for yourself at home?   ?? Watch your weight. A healthy weight helps your body use insulin properly.  ?? Limit the amount of calories, sweets,  and unhealthy fat you eat. Ask your doctor if you should see a dietitian. A registered dietitian can help you create meal plans that fit your lifestyle.  ?? Get at least 30 minutes of exercise on most days of the week. Exercise helps control your blood sugar. It also helps you maintain a healthy weight. Walking is a good choice. You also may want to do other activities, such as running, swimming, cycling, or playing tennis or team sports.  ?? Do not smoke. Smoking can make prediabetes worse. If you need help quitting, talk to your doctor about stop-smoking programs and medicines. These can increase your chances of quitting for good.  ?? If your doctor prescribed medicines, take them exactly as prescribed. Call your doctor if you think you are having a problem with your medicine. You will get more details on the specific medicines your doctor prescribes.  When should you call for help?  Watch closely for changes in your health, and be sure to contact your doctor if:  ?? ?? You have any symptoms of diabetes. These may include:  ? Being thirsty more often.  ? Urinating more.  ? Being hungrier.  ? Losing weight.  ? Being very tired.  ? Having blurry vision.   ?? ?? You have a wound that will not heal.   ?? ?? You have an infection that will not go away.   ?? ?? You have problems with your blood pressure.   ?? ?? You want more information about diabetes and how you can keep from getting it.   Where can you learn more?  Go to http://www.healthwise.net/GoodHelpConnections.  Enter I222 in the search box to learn more about "Prediabetes: Care Instructions."  Current as of: November 12, 2016  Content Version: 11.8  ?? 2006-2018 [employer]. Care instructions adapted under license by [employer] (which disclaims liability or warranty  for this information). If you have questions about a medical condition or this instruction, always ask your healthcare professional. [employer], [employer] disclaims any warranty or liability for your use of this information.

## 2017-11-10 NOTE — Progress Notes (Signed)
Chief complaint: Follow-up of hypertension, hyperlipidemia, gastroesophageal reflux disease, etc.        HPI:  Patient is a 66 year old obese black female with medical problems listed below presents for follow up of Hypertension, Hyperlipidemia, GERD, Hypothyroidism, etc. She she continues to have memory lapses and was previously referred to have a CT of the head done but was denied by her insurance company.  Otherwise, she has been feeling well and voices no other complaints today. She is complaint and taking her medications with no adverse effects reported and she has gained 5 pounds in the last several months.         Past Medical History:   Diagnosis Date   ??? Anemia    ??? Asthma    ??? Colon polyp    ??? DDD (degenerative disc disease) 02/15/2009   ??? Depression 12/18/2011   ??? Essential hypertension, benign    ??? History of echocardiogram 03/20/2004    EF 70%.  Borderline DDfx.  No significant valvular pathology.   ??? Hypercholesterolemia    ??? Hypertension    ??? Iron deficiency anemia 01/26/2541   ??? Lichen planus 06/12/2375   ??? Lower extremity venous duplex 10/23/2009    Left leg:  No DVT.     ??? Meralgia paraesthetica 10/22/2013   ??? OA (osteoarthritis of spine) 02/15/2009   ??? OA (osteoarthritis of spine) 02/15/2009   ??? Obesity, unspecified    ??? Other and unspecified hyperlipidemia    ??? Pre-operative cardiovascular examination     For spine surgery   ??? Right sided sciatica    ??? Shortness of breath     Possible asthma, HCVD; less likely CAD (Noted 03/15/09)   ??? Sleep apnea 02/15/2009    uses cpap machine   ??? Thallium stress test  03/20/2004    Partially transient, mod basal & mid anterior defect most c/w artifact; mild anterior ischemia less likely.  Neg EKG on pharm stress test.   ??? Thyroid disease     hypothyroidism     Allergies   Allergen Reactions   ??? Asa-Acetaminophen-Caff-Potass Other (comments)     Bleeding in stomach   ??? Benadryl [Diphenhydramine Hcl] Other (comments)     Muscle jerking    ??? Other Plant, Animal, Environmental Not Reported This Time     Grass, Dust and Mold   ??? Pollen Extracts Not Reported This Time     Current Outpatient Medications   Medication Sig Dispense Refill   ??? omeprazole (PRILOSEC) 40 mg capsule take 1 capsule by mouth once daily BEFORE BREAKFAST 30 Cap 3   ??? levothyroxine (SYNTHROID) 150 mcg tablet take 1 tablet by mouth once daily BEFORE BREAKFAST 30 Tab 3   ??? montelukast (SINGULAIR) 10 mg tablet TAKE 1 TABLET BY MOUTH AT BEDTIME IF NEEDED 90 Tab 1   ??? chlorpheniramine-HYDROcodone (TUSSIONEX) 10-8 mg/5 mL suspension Take 5 mL by mouth every twelve (12) hours as needed for Cough. Max Daily Amount: 10 mL. 240 mL 0   ??? Benzocaine-Menthol (CHLORASEPTIC SORE THROAT) 6-10 mg lozg 1 Lozenge by Mucous Membrane route as needed. 30 Lozenge 0   ??? gabapentin (NEURONTIN) 300 mg capsule take 1 capsule by mouth every morning and 2 capsules by mouth at bedtime 90 Cap 1   ??? atorvastatin (LIPITOR) 40 mg tablet take 1 tablet by mouth once daily 30 Tab 3   ??? levothyroxine (SYNTHROID) 150 mcg tablet TAKE 1 TABLET BY MOUTH ONCE DAILY BEFORE BREAKFAST 30 Tab 1   ???  carvedilol (COREG) 6.25 mg tablet TAKE 1 TABLET BY MOUTH TWO TIMES A DAY WITH MEALS 60 Tab 3   ??? latanoprost (XALATAN) 0.005 % ophthalmic solution      ??? multivitamin (ONE A DAY) tablet Take 1 Tab by mouth daily.     ??? valsartan-hydroCHLOROthiazide (DIOVAN-HCT) 160-25 mg per tablet take 1 tablet by mouth daily 90 Tab 2   ??? gabapentin (NEURONTIN) 300 mg capsule take 1 capsule by mouth every morning and 2 capsules by mouth at bedtime 180 Cap 1   ??? VARICELLA-ZOSTER VACINE LIVE 19,400 unit/0.65 mL susr injection   0   ??? budesonide-formoterol (SYMBICORT) 160-4.5 mcg/actuation HFA inhaler Take 2 Puffs by inhalation two (2) times a day. 1 Inhaler 5   ??? Cholecalciferol, Vitamin D3, (VITAMIN D-3) 5,000 unit Tab Take  by mouth daily.            ROS:  Constitutional: Negative for fever, chills, or fatigue   Neurological: Negative for headache, dizziness, visual disturbance, or loss of consciousness  Respiratory: Negative for SOB, hemoptysis, or wheezing  Cardiovascular: Negative for chest pain, palpitation, or leg swelling  Gastrointestinal: Negative for abdominal pain, nausea, vomiting, diarrhea, blood in stool, melena, or heartburn  Musculoskeletal: Negative for falls          Physical Exam:  Visit Vitals  BP 123/71   Pulse 92   Temp 98.3 ??F (36.8 ??C) (Oral)   Resp 18   Ht 5\' 2"  (1.575 m)   Wt 202 lb 9.6 oz (91.9 kg)   SpO2 91%   BMI 37.06 kg/m??     General: Obese black female, a & o x 3, afebrile, interacting appropriately, in no acute distress  Skin: warm and dry, no rashes , no bruises  Neck: supple, symmetrical, no thyromegaly  Respiratory: symmetrical chest expansion, lung sounds clear bilaterally, good air entry, good respiratory effort, no wheezes or crackles  Cardiovascular: normal S1S2, regular rate and rhythm, no murmurs, pulses palpable, no thrill, no carotid or abdominal bruits, no JVD  Abdomen: non-distended, normoactive bowel sounds x 4 quadrants, soft, non-tender to palpation  Musculoskeletal: normal ROM on all joints, no swelling or deformity, no perilumbar tenderness, steady gait  Extremity: No edema noted        Assessment/Plan:    ICD-10-CM ICD-9-CM    1. Pre-diabetes R73.03 790.29  counseled on diet and exercise  HEMOGLOBIN A1C W/O EAG   2. HTN (hypertension), benign I10 401.1  controlled.  Continue current medication she was counseled on low-salt diet.  CBC WITH AUTOMATED DIFF      METABOLIC PANEL, COMPREHENSIVE      TSH 3RD GENERATION   3. Hypercholesteremia E78.00 272.0  counseled on low-fat diet.  LIPID PANEL   4. Vitamin D deficiency E55.9 268.9    5. Severe obesity (BMI 35.0-39.9) with comorbidity ([place]) E66.01 278.01 counseled on diet and exercise   6. Memory problem R41.3 780.93 REFERRAL TO NEUROLOGY         Orders Placed This Encounter   ??? CBC WITH AUTOMATED DIFF      Standing Status:   Future     Standing Expiration Date:   05/09/2018   ??? HEMOGLOBIN A1C W/O EAG     Standing Status:   Future     Standing Expiration Date:   11/11/2018   ??? LIPID PANEL     Standing Status:   Future     Standing Expiration Date:   06/07/5365   ??? METABOLIC PANEL, COMPREHENSIVE  Standing Status:   Future     Standing Expiration Date:   05/09/2018   ??? TSH 3RD GENERATION     Standing Status:   Future     Standing Expiration Date:   03/10/2018   ??? [facility]     Referral Priority:   Routine     Referral Type:   Consultation     Referral Reason:   Specialty Services Required     Referred to Provider:   Deeann Cree, MD     Number of Visits Requested:   1       Additional Notes: Discussed today's diagnosis, treatment plans. Discussed medication indications and side effects.    Weight Management:Counseled  After Visit Summary: Discussed provided printed patient instructions. Answered questions accordingly.  Follow-up Disposition: In 1 week to review labs          Osie Cheeks, DO, MPH  Internal Medicine          Total time spent for today's visit was 25 minutes with greater than 50% of time spent involved in counseling and coordination of care as outlined above.

## 2017-11-10 NOTE — Progress Notes (Signed)
Elaine [name] is a  66 y.o. female presents today for office visit for routine follow up    1. Have you been to the ER, urgent care clinic or hospitalized since your last visit?NO    2. Have you seen or consulted any other health care providers outside of the [facility] since your last visit (Include any pap smears or colon screening)?NO

## 2017-11-15 MED ORDER — GABAPENTIN 300 MG CAP
300 mg | ORAL_CAPSULE | ORAL | 1 refills | Status: DC
Start: 2017-11-15 — End: 2018-01-03

## 2017-11-15 NOTE — Telephone Encounter (Signed)
Gabapentin refilled and sent to pharmacy. Please call and notify pt.

## 2017-11-30 ENCOUNTER — Encounter

## 2017-11-30 ENCOUNTER — Telehealth

## 2017-12-01 MED ORDER — VALSARTAN-HYDROCHLOROTHIAZIDE 160 MG-25 MG TAB
160-25 mg | ORAL_TABLET | ORAL | 0 refills | Status: DC
Start: 2017-12-01 — End: 2018-03-11

## 2017-12-01 MED ORDER — ATORVASTATIN 40 MG TAB
40 mg | ORAL_TABLET | ORAL | 3 refills | Status: DC
Start: 2017-12-01 — End: 2018-04-13

## 2017-12-01 NOTE — Telephone Encounter (Signed)
unable to reach patient.

## 2017-12-01 NOTE — Telephone Encounter (Signed)
Needs a follow-up

## 2017-12-01 NOTE — Telephone Encounter (Signed)
Lipitor refilled and sent to the pharmacy. Please call and notify pt.

## 2017-12-10 ENCOUNTER — Other Ambulatory Visit: Payer: Self-pay

## 2017-12-10 DIAGNOSIS — R1013 Epigastric pain: Secondary | ICD-10-CM | POA: Diagnosis not present

## 2017-12-10 DIAGNOSIS — E119 Type 2 diabetes mellitus without complications: Secondary | ICD-10-CM | POA: Insufficient documentation

## 2017-12-10 DIAGNOSIS — Z79899 Other long term (current) drug therapy: Secondary | ICD-10-CM | POA: Diagnosis not present

## 2017-12-10 DIAGNOSIS — K8020 Calculus of gallbladder without cholecystitis without obstruction: Secondary | ICD-10-CM | POA: Diagnosis not present

## 2017-12-10 DIAGNOSIS — Z7982 Long term (current) use of aspirin: Secondary | ICD-10-CM | POA: Diagnosis not present

## 2017-12-10 DIAGNOSIS — R110 Nausea: Secondary | ICD-10-CM | POA: Diagnosis not present

## 2017-12-10 DIAGNOSIS — I10 Essential (primary) hypertension: Secondary | ICD-10-CM | POA: Insufficient documentation

## 2017-12-10 DIAGNOSIS — R079 Chest pain, unspecified: Secondary | ICD-10-CM | POA: Diagnosis not present

## 2017-12-11 ENCOUNTER — Emergency Department (HOSPITAL_COMMUNITY): Payer: Medicare Other

## 2017-12-11 ENCOUNTER — Encounter (HOSPITAL_COMMUNITY): Payer: Self-pay | Admitting: Emergency Medicine

## 2017-12-11 ENCOUNTER — Emergency Department (HOSPITAL_COMMUNITY)
Admission: EM | Admit: 2017-12-11 | Discharge: 2017-12-11 | Disposition: A | Discharge status: Home / Self Care | Payer: Medicare Other | Attending: Emergency Medicine | Admitting: Emergency Medicine

## 2017-12-11 DIAGNOSIS — K8020 Calculus of gallbladder without cholecystitis without obstruction: Secondary | ICD-10-CM | POA: Diagnosis not present

## 2017-12-11 DIAGNOSIS — R079 Chest pain, unspecified: Secondary | ICD-10-CM | POA: Diagnosis not present

## 2017-12-11 HISTORY — DX: Type 2 diabetes mellitus without complications: E11.9

## 2017-12-11 LAB — HEPATIC FUNCTION PANEL
ALBUMIN: 4 g/dL (ref 3.5–5.0)
ALK PHOS: 61 U/L (ref 38–126)
ALT: 19 U/L (ref 14–54)
AST: 26 U/L (ref 15–41)
BILIRUBIN TOTAL: 0.2 mg/dL — AB (ref 0.3–1.2)
Total Protein: 7.8 g/dL (ref 6.5–8.1)

## 2017-12-11 LAB — BASIC METABOLIC PANEL
ANION GAP: 10 (ref 5–15)
BUN: 14 mg/dL (ref 6–20)
CHLORIDE: 100 mmol/L — AB (ref 101–111)
CO2: 28 mmol/L (ref 22–32)
CREATININE: 0.94 mg/dL (ref 0.44–1.00)
Calcium: 9.7 mg/dL (ref 8.9–10.3)
GFR calc non Af Amer: >60 mL/min (ref >60)
Glucose, Bld: 164 mg/dL — ABNORMAL HIGH (ref 65–99)
POTASSIUM: 3.9 mmol/L (ref 3.5–5.1)
SODIUM: 138 mmol/L (ref 135–145)

## 2017-12-11 LAB — CBC
HEMATOCRIT: 35.9 % — AB (ref 36.0–46.0)
HEMOGLOBIN: 12.1 g/dL (ref 12.0–15.0)
MCH: 31.3 pg (ref 26.0–34.0)
MCHC: 33.7 g/dL (ref 30.0–36.0)
MCV: 92.8 fL (ref 78.0–100.0)
PLATELETS: 308 10*3/uL (ref 150–400)
RBC: 3.87 MIL/uL (ref 3.87–5.11)
RDW: 14.8 % (ref 11.5–15.5)
WBC: 11.3 10*3/uL — AB (ref 4.0–10.5)

## 2017-12-11 LAB — LIPASE, BLOOD: LIPASE: 33 U/L (ref 11–51)

## 2017-12-11 LAB — I-STAT TROPONIN, ED: Troponin i, poc: 0 ng/mL (ref 0.00–0.08)

## 2017-12-11 MED ORDER — FAMOTIDINE IN NACL 20-0.9 MG/50ML-% IV SOLN
20.0000 mg | Freq: Once | INTRAVENOUS | Status: AC
  Administered 2017-12-11: 20 mg via INTRAVENOUS
  Filled 2017-12-11: qty 50

## 2017-12-11 MED ORDER — FENTANYL CITRATE (PF) 100 MCG/2ML IJ SOLN
50.0000 ug | Freq: Once | INTRAMUSCULAR | Status: AC
  Administered 2017-12-11: 50 ug via INTRAVENOUS
  Filled 2017-12-11: qty 2

## 2017-12-11 MED ORDER — ONDANSETRON 4 MG PO TBDP
4.0000 mg | ORAL_TABLET | Freq: Once | ORAL | Status: AC
  Administered 2017-12-11: 4 mg via ORAL
  Filled 2017-12-11: qty 1

## 2017-12-11 MED ORDER — ONDANSETRON HCL 4 MG/2ML IJ SOLN
4.0000 mg | Freq: Once | INTRAMUSCULAR | Status: AC
  Administered 2017-12-11: 4 mg via INTRAVENOUS
  Filled 2017-12-11: qty 2

## 2017-12-11 MED ORDER — HYDROMORPHONE HCL 1 MG/ML IJ SOLN
0.5000 mg | Freq: Once | INTRAMUSCULAR | Status: DC
  Filled 2017-12-11: qty 1

## 2017-12-11 MED ORDER — GI COCKTAIL ~~LOC~~: 30.0000 mL | Freq: Once | ORAL | Status: DC

## 2017-12-11 MED ORDER — METOCLOPRAMIDE HCL 5 MG/ML IJ SOLN
5.0000 mg | Freq: Once | INTRAMUSCULAR | Status: DC
  Filled 2017-12-11: qty 2

## 2017-12-11 MED ORDER — OXYCODONE-ACETAMINOPHEN 5-325 MG PO TABS: 1.0000 | ORAL_TABLET | ORAL | 0 refills | Status: AC | PRN

## 2017-12-11 MED ORDER — GI COCKTAIL ~~LOC~~
30.0000 mL | Freq: Once | ORAL | Status: AC
  Administered 2017-12-11: 30 mL via ORAL
  Filled 2017-12-11: qty 30

## 2017-12-11 NOTE — ED Notes (Signed)
Pt now states she wants to try the GI cocktail

## 2017-12-11 NOTE — ED Notes (Signed)
Pt reports onset of severe epigastric pain 7p, pain started after eating chili for dinner. Pain has worsened since then. Pt states it feels like her GERD. Offered pt GI cocktail but pt has refused.

## 2017-12-11 NOTE — ED Provider Notes (Signed)
[facility] EMERGENCY DEPARTMENT Provider Note   CSN: [id number] Arrival date & time: 12/10/17  2345     History   Chief Complaint Chief Complaint  Patient presents with  . Epigastric Pain    HPI Michelle Chavez is a 67 y.o. female.  Patient presents to the emergency department for evaluation of epigastric abdominal pain.  She reports that she had chili for dinner and then around 7 PM started having severe burning aching pain in the center of her upper abdomen.  She reports that she had similar symptoms to this years ago with acid reflux.  She was given a GI cocktail here but it did not help.  Patient reports that the pain is very crampy and she is pacing in the room currently.  She cannot find a comfortable position.  She feels nauseated but has not vomited.  No fever.  She is not having chest pain or shortness of breath.      Past Medical History:  Diagnosis Date  . Diabetes mellitus without complication (HCC)   . GERD (gastroesophageal reflux disease)   . Gout   . Hypercholesteremia   . Hypertension     Patient Active Problem List   Diagnosis Date Noted  . Post-menopausal bleeding 03/26/2016    History reviewed. No pertinent surgical history.  OB History    Gravida Para Term Preterm AB Living   0 0 0 0 0 0   SAB TAB Ectopic Multiple Live Births   0 0 0 0         Home Medications    Prior to Admission medications   Medication Sig Start Date End Date Taking? Authorizing Provider  allopurinol (ZYLOPRIM) 100 MG tablet Take 100 mg by mouth daily.    [provider]  aspirin 81 MG tablet Take 81 mg by mouth daily.    [provider]  Cholecalciferol (VITAMIN D PO) Take 1 tablet by mouth daily.    [provider]  lisinopril (PRINIVIL,ZESTRIL) 20 MG tablet Take 20 mg by mouth daily.    [provider]  omeprazole (PRILOSEC) 20 MG capsule Take 20 mg by mouth daily as needed (for stomach acid).     [provider]  ondansetron (ZOFRAN ODT) 4 MG disintegrating tablet Take 1 tablet (4 mg total) by mouth every 8 (eight) hours as needed for nausea or vomiting. 11/23/16   Ward, Layla Maw, DO  oxyCODONE-acetaminophen (PERCOCET) 5-325 MG tablet Take 1-2 tablets by mouth every 4 (four) hours as needed. 12/11/17   Gilda Crease, MD  simvastatin (ZOCOR) 10 MG tablet Take 10 mg by mouth daily. 02/14/16   [provider]  sucralfate (CARAFATE) 1 GM/10ML suspension Take 10 mLs (1 g total) by mouth 4 (four) times daily -  with meals and at bedtime. 11/23/16   Ward, Layla Maw, DO    Family History Family History  Problem Relation Age of Onset  . Heart disease Mother   . Diabetes Mother   . Lung cancer Father   . Diabetes Brother     Social History Social History   Tobacco Use  . Smoking status: Never Smoker  . Smokeless tobacco: Never Used  Substance Use Topics  . Alcohol use: No  . Drug use: No     Allergies   Morphine and related   Review of Systems Review of Systems  Respiratory: Negative for shortness of breath.   Cardiovascular: Negative for chest pain.  Gastrointestinal: Positive for abdominal  pain and nausea.  All other systems reviewed and are negative.    Physical Exam Updated Vital Signs BP (!) 165/65   Pulse (!) 56   Temp 98 F (36.7 C) (Oral)   Resp 14   Ht 5\' 5"  (1.651 m)   Wt 106.6 kg (235 lb)   SpO2 96%   BMI 39.11 kg/m   Physical Exam  Constitutional: She is oriented to person, place, and time. She appears well-developed and well-nourished. She appears distressed.  HENT:  Head: Normocephalic and atraumatic.  Right Ear: Hearing normal.  Left Ear: Hearing normal.  Nose: Nose normal.  Mouth/Throat: Oropharynx is clear and moist and mucous membranes are normal.  Eyes: Conjunctivae and EOM are normal. Pupils are equal, round, and reactive to light.  Neck: Normal range of motion. Neck supple.  Cardiovascular: Regular rhythm, S1 normal and  S2 normal. Exam reveals no gallop and no friction rub.  No murmur heard. Pulmonary/Chest: Effort normal and breath sounds normal. No respiratory distress. She exhibits no tenderness.  Abdominal: Soft. Normal appearance and bowel sounds are normal. There is no hepatosplenomegaly. There is tenderness in the epigastric area. There is no rebound, no guarding, no tenderness at McBurney's point and negative Murphy's sign. No hernia.  Musculoskeletal: Normal range of motion.  Neurological: She is alert and oriented to person, place, and time. She has normal strength. No cranial nerve deficit or sensory deficit. Coordination normal. GCS eye subscore is 4. GCS verbal subscore is 5. GCS motor subscore is 6.  Skin: Skin is warm, dry and intact. No rash noted. No cyanosis.  Psychiatric: She has a normal mood and affect. Her speech is normal and behavior is normal. Thought content normal.  Nursing note and vitals reviewed.    ED Treatments / Results  Labs (all labs ordered are listed, but only abnormal results are displayed) Labs Reviewed  BASIC METABOLIC PANEL - Abnormal; Notable for the following components:      Result Value   Chloride 100 (*)    Glucose, Bld 164 (*)    All other components within normal limits  CBC - Abnormal; Notable for the following components:   WBC 11.3 (*)    HCT 35.9 (*)    All other components within normal limits  HEPATIC FUNCTION PANEL - Abnormal; Notable for the following components:   Total Bilirubin 0.2 (*)    Bilirubin, Direct <0.1 (*)    All other components within normal limits  LIPASE, BLOOD  I-STAT TROPONIN, ED    EKG  EKG Interpretation  Date/Time:  Friday December 10 2017 23:52:04 EST Ventricular Rate:  93 PR Interval:  156 QRS Duration: 80 QT Interval:  356 QTC Calculation: 442 R Axis:   61 Text Interpretation:  Normal sinus rhythm Normal ECG Confirmed by Gilda Crease [phone]) on 12/11/2017 1:18:18 AM       Radiology Dg Chest 2  View  Result Date: 12/11/2017 CLINICAL DATA:  Chest and epigastric pain tonight. EXAM: CHEST  2 VIEW COMPARISON:  12/01/2013 FINDINGS: Normal heart size and mediastinal contours. Mild interstitial and bronchial thickening. Low lung volumes with mild basilar atelectasis. No confluent consolidation. No pleural fluid or pneumothorax. No acute osseous abnormalities. IMPRESSION: Mild interstitial and bronchial thickening which may be due to bronchial inflammation, atypical infection, or less likely pulmonary edema. Electronically Signed   By: Rubye Oaks M.D.   On: 12/11/2017 00:36   US Abdomen Limited Ruq  Result Date: 12/11/2017 CLINICAL DATA:  Upper abdominal pain after  eating. EXAM: ULTRASOUND ABDOMEN LIMITED RIGHT UPPER QUADRANT COMPARISON:  Ultrasound 12/01/2013 FINDINGS: Gallbladder: Physiologically distended containing multiple shadowing gallstones. 1.9 cm stone in the neck of the gallbladder, limited assessment for stone mobility is patient was unable to turned prone. No gallbladder wall thickening, normal wall thickness of 2 mm. No pericholecystic fluid. No sonographic Murphy sign noted by sonographer. Common bile duct: Diameter: 5 mm, normal. Liver: Diffusely increased in parenchymal echogenicity. Liver parenchyma is difficult to penetrate. No focal lesion is seen. Portal vein is patent on color Doppler imaging with normal direction of blood flow towards the liver. IMPRESSION: 1. Cholelithiasis with stone in the gallbladder neck. No sonographic findings of acute cholecystitis. 2. Hepatic steatosis. Electronically Signed   By: Rubye OaksMelanie  Ehinger M.D.   On: 12/11/2017 02:31    Procedures Procedures (including critical care time)  Medications Ordered in ED Medications  metoCLOPramide (REGLAN) injection 5 mg (5 mg Intravenous Not Given 12/11/17 0341)  HYDROmorphone (DILAUDID) injection 0.5 mg (0.5 mg Intravenous Not Given 12/11/17 0341)  ondansetron (ZOFRAN-ODT) disintegrating tablet 4 mg (4 mg Oral  Given 12/11/17 0008)  gi cocktail (Maalox,Lidocaine,Donnatal) (30 mLs Oral Given 12/11/17 0037)  famotidine (PEPCID) IVPB 20 mg premix (0 mg Intravenous Stopped 12/11/17 0238)  fentaNYL (SUBLIMAZE) injection 50 mcg (50 mcg Intravenous Given 12/11/17 0147)  ondansetron (ZOFRAN) injection 4 mg (4 mg Intravenous Given 12/11/17 0147)  fentaNYL (SUBLIMAZE) injection 50 mcg (50 mcg Intravenous Given 12/11/17 0425)     Initial Impression / Assessment and Plan / ED Course  I have reviewed the triage vital signs and the nursing notes.  Pertinent labs & imaging results that were available during my care of the patient were reviewed by me and considered in my medical decision making (see chart for details).     Patient presented for severe epigastric distress after eating chili.  She was in a lot of pain at arrival, appeared to be experiencing colicky pain.  She did have some right upper quadrant tenderness but no Murphy sign, tenderness was more epigastric in nature.  She has normal labs including hepatic panel and lipase.  Ultrasound was performed and does show evidence of gallstones but no cholecystitis.  Patient given 2 doses of fentanyl and is now pain-free.  She is appropriate for discharge and outpatient follow-up with general surgery for cholelithiasis.  She was given return precautions.  Final Clinical Impressions(s) / ED Diagnoses   Final diagnoses:  Calculus of gallbladder without cholecystitis without obstruction    ED Discharge Orders        Ordered    oxyCODONE-acetaminophen (PERCOCET) 5-325 MG tablet  Every 4 hours PRN     12/11/17 0505       Gilda CreasePollina, Christopher J, MD 12/11/17 [phone]

## 2017-12-11 NOTE — ED Notes (Signed)
Pt still pacing in the room stating she is pain and GI cocktail did not work.

## 2017-12-11 NOTE — ED Notes (Signed)
Patient transported to X-ray 

## 2017-12-11 NOTE — ED Notes (Signed)
Pt came to [facility] and requested the GI cocktail which she refused earlier. Pt taken back to triage to be seen by RN Delorise Jacksonori

## 2017-12-11 NOTE — ED Notes (Signed)
Pt pacing around room stating that the GI cocktail didn't do anything and that she is in pain and can't sit still. Unable to put pt on the monitor at this time due to her pacing Gabriel RungAdrian RN made aware

## 2017-12-11 NOTE — ED Notes (Signed)
Pt returned from xray

## 2017-12-23 NOTE — Progress Notes (Signed)
Chart review reveals scheduled follow-up to discuss migraines.

## 2017-12-29 ENCOUNTER — Ambulatory Visit
Admit: 2017-12-29 | Discharge: 2017-12-29 | Payer: PRIVATE HEALTH INSURANCE | Attending: Neurology | Primary: Internal Medicine

## 2017-12-29 DIAGNOSIS — R413 Other amnesia: Secondary | ICD-10-CM

## 2017-12-29 NOTE — Progress Notes (Signed)
Re:  Elaine [name],Follow up visit     12/29/2017 10:38 AM    SSN: [id number]    Subjective:   Elaine [name] returns for follow up.  She's never tried the Topamax for fear of side effects.  She gets headaches associated with her asthma attacks.      Her new complaint is difficulty with memory.  This has been noted for the last 2 years, progressively worse.      Medications:    Current Outpatient Medications   Medication Sig Dispense Refill   ??? valsartan-hydroCHLOROthiazide (DIOVAN-HCT) 160-25 mg per tablet take 1 tablet by mouth once daily 90 Tab 0   ??? atorvastatin (LIPITOR) 40 mg tablet take 1 tablet by mouth once daily 30 Tab 3   ??? omeprazole (PRILOSEC) 40 mg capsule take 1 capsule by mouth once daily BEFORE BREAKFAST 30 Cap 3   ??? montelukast (SINGULAIR) 10 mg tablet TAKE 1 TABLET BY MOUTH AT BEDTIME IF NEEDED 90 Tab 1   ??? carvedilol (COREG) 6.25 mg tablet TAKE 1 TABLET BY MOUTH TWO TIMES A DAY WITH MEALS 60 Tab 3   ??? latanoprost (XALATAN) 0.005 % ophthalmic solution      ??? multivitamin (ONE A DAY) tablet Take 1 Tab by mouth daily.     ??? gabapentin (NEURONTIN) 300 mg capsule take 1 capsule by mouth every morning and 2 capsules by mouth at bedtime 180 Cap 1   ??? budesonide-formoterol (SYMBICORT) 160-4.5 mcg/actuation HFA inhaler Take 2 Puffs by inhalation two (2) times a day. 1 Inhaler 5   ??? Cholecalciferol, Vitamin D3, (VITAMIN D-3) 5,000 unit Tab Take  by mouth daily.     ??? gabapentin (NEURONTIN) 300 mg capsule take 1 capsule by mouth every morning and 2 capsules by mouth at bedtime 90 Cap 1   ??? levothyroxine (SYNTHROID) 150 mcg tablet take 1 tablet by mouth once daily BEFORE BREAKFAST 30 Tab 3   ??? chlorpheniramine-HYDROcodone (TUSSIONEX) 10-8 mg/5 mL suspension Take 5 mL by mouth every twelve (12) hours as needed for Cough. Max Daily Amount: 10 mL. 240 mL 0   ??? Benzocaine-Menthol (CHLORASEPTIC SORE THROAT) 6-10 mg lozg 1 Lozenge by Mucous Membrane route as needed. 30 Lozenge 0    ??? levothyroxine (SYNTHROID) 150 mcg tablet TAKE 1 TABLET BY MOUTH ONCE DAILY BEFORE BREAKFAST 30 Tab 1   ??? VARICELLA-ZOSTER VACINE LIVE 19,400 unit/0.65 mL susr injection   0       Vital signs:    Visit Vitals  BP 102/60 (BP 1 Location: Left arm, BP Patient Position: Sitting)   Pulse 77   Temp 98.4 ??F (36.9 ??C) (Oral)   Resp 22   Ht '5\' 2"'$  (1.575 m)   Wt 89.7 kg (197 lb 12.8 oz)   SpO2 98%   BMI 36.18 kg/m??       Review of Systems:   As above otherwise 11 point review of systems negative including;   Constitutional no fever or chills  Skin denies rash or itching  HEENT  Denies tinnitus, hearing lose  Eyes denies diplopia vision lose  Respiratory denies sortness of breath  Cardiovascular denies chest pain, dyspnea on exertion  Gastrointestinal denies nausea, vomiting, diarrhea, constipation  Genitourinary denies incontinence  Musculoskeletal denies joint pain or swelling  Endocrine denies weight change  Hematology denies easy bruising or bleeding   Neurological as above in HPI      Patient Active Problem List   Diagnosis Code   ??? DOE (dyspnea on  exertion) R06.09   ??? Hypothyroidism E03.9   ??? Hypercholesteremia E78.00   ??? HTN (hypertension), benign I10   ??? GERD (gastroesophageal reflux disease) K21.9   ??? Asthma J45.909   ??? Vitamin D deficiency E55.9   ??? Iron deficiency anemia D50.9   ??? Submental lymphadenopathy R59.0   ??? Sciatica M54.30   ??? Depression F32.9   ??? Meralgia paraesthetica G57.10   ??? Obesity (BMI 30-39.9) E66.9   ??? Pre-diabetes R73.03   ??? Abnormal stress test R94.39   ??? Need for hepatitis C screening test Z11.59   ??? Glaucoma screening Z13.5   ??? Left knee pain M25.562   ??? Swelling of left lower extremity M79.89   ??? Osteoarthritis of left knee M17.12   ??? Post-menopausal Z78.0   ??? 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 Dehydration E86.0   ??? Migraine without aura and without status migrainosus, not intractable G43.009   ??? Claustrophobia F40.240   ??? 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 Rash R21   ??? Need for influenza vaccination Z23   ??? Encounter for long-term (current) use of medications Z79.899   ??? URI (upper respiratory infection) J06.9   ??? Cough R05   ??? Routine general medical examination at a health care facility Z00.00   ??? Need for pneumococcal vaccination Z23   ??? Hospital discharge follow-up Z09   ??? Chest pain R07.9   ??? Chronic diastolic congestive heart failure (HCC) I50.32   ??? Severe obesity (BMI 35.0-39.9) with comorbidity ([place]) E66.01   ??? Sore throat J02.9   ??? Memory problem R41.3         Objective: The patient is awake, alert, and oriented x 4.  Fund of knowledge is adequate.  Speech is fluent and memory is intact.  She scored a 29/30 on the MMSE.  Cranial Nerves: II ??? Visual fields are full to confrontation.  III, IV, VI ??? Extraocular movements are intact. There is no nystagmus. V ??? Facial sensation is intact to pinprick.  VII ??? Face is symmetrical.  VIII - Hearing is present.  IX, X, XII ??? Palate is symmetrical.   XI - Shoulder shrugging and head turning intact  Motor:  The patient moves all four limbs fairly well and symmetrically. Tone is normal. Reflexes are 2+ and symmetrical. Plantars are down going. Gait is normal.    CBC:   Lab Results   Component Value Date/Time    WBC 5.9 10/24/2016 07:00 AM    RBC 4.39 10/24/2016 07:00 AM    HGB 10.6 (L) 10/24/2016 07:00 AM    HCT 33.5 (L) 10/24/2016 07:00 AM    PLATELET 286 10/24/2016 07:00 AM     BMP:   Lab Results   Component Value Date/Time    Glucose 119 (H) 02/13/2017 06:21 PM    Sodium 138 02/13/2017 06:21 PM    Potassium 3.0 (L) 02/13/2017 06:21 PM    Chloride 97 (L) 02/13/2017 06:21 PM    CO2 34 (H) 02/13/2017 06:21 PM    BUN 18 02/13/2017 06:21 PM    Creatinine 0.9 02/13/2017 06:21 PM    Calcium 9.1 02/13/2017 06:21 PM     CMP:   Lab Results   Component Value Date/Time    Glucose 119 (H) 02/13/2017 06:21 PM    Sodium 138 02/13/2017 06:21 PM    Potassium 3.0 (L) 02/13/2017 06:21 PM    Chloride 97 (L) 02/13/2017 06:21 PM  CO2 34 (H) 02/13/2017 06:21 PM    BUN 18 02/13/2017 06:21 PM    Creatinine 0.9 02/13/2017 06:21 PM    Calcium 9.1 02/13/2017 06:21 PM    Anion gap 5 10/24/2016 07:00 AM    BUN/Creatinine ratio 21 (H) 10/24/2016 07:00 AM    Alk. phosphatase 81 10/24/2016 07:00 AM    Protein, total 7.1 10/24/2016 07:00 AM    Albumin 3.7 10/24/2016 07:00 AM    Globulin 3.4 10/24/2016 07:00 AM    A-G Ratio 1.1 10/24/2016 07:00 AM     Coagulation:   Lab Results   Component Value Date/Time    INR, External 1.0 10/09/2014       Assessment:  Concern for memory trouble, but no evidence of this on testing.  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  Migraines, declining therapy for concern of side effects.    Plan:  Will see back in 6 months to monitor her.  No therapy at this time.      Sincerely,        Legrand Como A. Gretchen Portela, M.D.

## 2018-01-03 ENCOUNTER — Ambulatory Visit
Admit: 2018-01-03 | Discharge: 2018-01-03 | Payer: PRIVATE HEALTH INSURANCE | Attending: Internal Medicine | Primary: Internal Medicine

## 2018-01-03 DIAGNOSIS — J4520 Mild intermittent asthma, uncomplicated: Secondary | ICD-10-CM

## 2018-01-03 NOTE — Progress Notes (Signed)
Elaine [name] is a 67 y.o.  female and presents with Asthma (f/u pt sleepy yesterday); Wheezing (on Proair ); and Other (low potassium )      SUBJECTIVE:  Patient was feeling tired and sleepy when she woke up yesterday.  She also had some mild wheezing.  During the day when she went to church she was somnolent.  [facility] where her potassium was found to be low.  The patient was given inhaler and has felt better woke up this morning without any difficulties.  Patient is on a diuretic for high blood pressure.  This is a former patient of Dr.Lawani and will follow up with me from now on.      Respiratory ROS: negative for - shortness of breath  Cardiovascular ROS: negative for - chest pain    Current Outpatient Medications   Medication Sig   ??? PROAIR HFA 90 mcg/actuation inhaler    ??? valsartan-hydroCHLOROthiazide (DIOVAN-HCT) 160-25 mg per tablet take 1 tablet by mouth once daily   ??? atorvastatin (LIPITOR) 40 mg tablet take 1 tablet by mouth once daily   ??? omeprazole (PRILOSEC) 40 mg capsule take 1 capsule by mouth once daily BEFORE BREAKFAST   ??? montelukast (SINGULAIR) 10 mg tablet TAKE 1 TABLET BY MOUTH AT BEDTIME IF NEEDED   ??? chlorpheniramine-HYDROcodone (TUSSIONEX) 10-8 mg/5 mL suspension Take 5 mL by mouth every twelve (12) hours as needed for Cough. Max Daily Amount: 10 mL.   ??? Benzocaine-Menthol (CHLORASEPTIC SORE THROAT) 6-10 mg lozg 1 Lozenge by Mucous Membrane route as needed.   ??? levothyroxine (SYNTHROID) 150 mcg tablet TAKE 1 TABLET BY MOUTH ONCE DAILY BEFORE BREAKFAST   ??? carvedilol (COREG) 6.25 mg tablet TAKE 1 TABLET BY MOUTH TWO TIMES A DAY WITH MEALS   ??? latanoprost (XALATAN) 0.005 % ophthalmic solution    ??? multivitamin (ONE A DAY) tablet Take 1 Tab by mouth daily.   ??? gabapentin (NEURONTIN) 300 mg capsule take 1 capsule by mouth every morning and 2 capsules by mouth at bedtime   ??? budesonide-formoterol (SYMBICORT) 160-4.5 mcg/actuation HFA inhaler Take  2 Puffs by inhalation two (2) times a day.   ??? Cholecalciferol, Vitamin D3, (VITAMIN D-3) 5,000 unit Tab Take  by mouth daily.   ??? levothyroxine (SYNTHROID) 150 mcg tablet take 1 tablet by mouth once daily BEFORE BREAKFAST   ??? VARICELLA-ZOSTER VACINE LIVE 19,400 unit/0.65 mL susr injection      No current facility-administered medications for this visit.          OBJECTIVE:  alert, well appearing, and in no distress  Visit Vitals  BP 111/68 (BP 1 Location: Left arm, BP Patient Position: Sitting)   Pulse 99   Temp 99.1 ??F (37.3 ??C) (Oral)   Resp 20   Ht 5\' 2"  (1.575 m)   Wt 196 lb (88.9 kg)   SpO2 95%   BMI 35.85 kg/m??      well developed and well nourished  Chest - clear to auscultation, no wheezes, rales or rhonchi, symmetric air entry  Heart - normal rate, regular rhythm, normal S1, S2, no murmurs, rubs, clicks or gallops        Assessment/Plan      ICD-10-CM ICD-9-CM    1. Mild intermittent asthma, unspecified whether complicated F57.32 202.54 Improved on Albuterol. Pt to continue Symbicort    2. Somnolence R40.0 780.09 Will check METABOLIC PANEL, COMPREHENSIVE      CBC WITH AUTOMATED DIFF  Follow-up Disposition:  Return in about 4 weeks (around 01/31/2018) for BP check.    Reviewed plan of care. Patient has provided input and agrees with goals.

## 2018-01-03 NOTE — Patient Instructions (Signed)
Asthma Attack: Care Instructions  Your Care Instructions    During an asthma attack, the airways swell and narrow. This makes it hard to breathe. Severe asthma attacks can be life-threatening, but you can help prevent them by keeping your asthma under control and treating symptoms before they get bad. Symptoms include being short of breath, having chest tightness, coughing, and wheezing. Noting and treating these symptoms can also help you avoid future trips to the emergency room.  The doctor has checked you carefully, but problems can develop later. If you notice any problems or new symptoms, get medical treatment right away.  Follow-up care is a key part of your treatment and safety. Be sure to make and go to all appointments, and call your doctor if you are having problems. It's also a good idea to know your test results and keep a list of the medicines you take.  How can you care for yourself at home?  ?? Follow your asthma action plan to prevent and treat attacks. If you don't have an asthma action plan, work with your doctor to create one.  ?? Take your asthma medicines exactly as prescribed. Talk to your doctor right away if you have any questions about how to take them.  ? Use your quick-relief medicine when you have symptoms of an attack. Quick-relief medicine is usually an albuterol inhaler. Some people need to use quick-relief medicine before they exercise.  ? Take your controller medicine every day, not just when you have symptoms. Controller medicine is usually an inhaled corticosteroid. The goal is to prevent problems before they occur. Don't use your controller medicine to treat an attack that has already started. It doesn't work fast enough to help.  ? If your doctor prescribed corticosteroid pills to use during an attack, take them exactly as prescribed. It may take hours for the pills to work, but they may make the episode shorter and help you breathe better.   ? Keep your quick-relief medicine with you at all times.  ?? Talk to your doctor before using other medicines. Some medicines, such as aspirin, can cause asthma attacks in some people.  ?? If you have a peak flow meter, use it to check how well you are breathing. This can help you predict when an asthma attack is going to occur. Then you can take medicine to prevent the asthma attack or make it less severe.  ?? Do not smoke or allow others to smoke around you. Avoid smoky places. Smoking makes asthma worse. If you need help quitting, talk to your doctor about stop-smoking programs and medicines. These can increase your chances of quitting for good.  ?? Learn what triggers an asthma attack for you, and avoid the triggers when you can. Common triggers include colds, smoke, air pollution, dust, pollen, mold, pets, cockroaches, stress, and cold air.  ?? Avoid colds and the flu. Get a pneumococcal vaccine shot. If you have had one before, ask your doctor if you need a second dose. Get a flu vaccine every fall. If you must be around people with colds or the flu, wash your hands often.  When should you call for help?  Call 911 anytime you think you may need emergency care. For example, call if:  ?? ?? You have severe trouble breathing.   ??Call your doctor now or seek immediate medical care if:  ?? ?? Your symptoms do not get better after you have followed your asthma action plan.   ?? ?? You   have new or worse trouble breathing.   ?? ?? Your coughing and wheezing get worse.   ?? ?? You cough up dark brown or bloody mucus (sputum).   ?? ?? You have a new or higher fever.   ??Watch closely for changes in your health, and be sure to contact your doctor if:  ?? ?? You need to use quick-relief medicine on more than 2 days a week (unless it is just for exercise).   ?? ?? You cough more deeply or more often, especially if you notice more mucus or a change in the color of your mucus.   ?? ?? You are not getting better as expected.    Where can you learn more?  Go to http://www.healthwise.net/[employer].  Enter F084 in the search box to learn more about "Asthma Attack: Care Instructions."  Current as of: August 11, 2017  Content Version: 11.9  ?? 2006-2018 [employer]. Care instructions adapted under license by [employer] (which disclaims liability or warranty for this information). If you have questions about a medical condition or this instruction, always ask your healthcare professional. [employer] disclaims any warranty or liability for your use of this information.

## 2018-01-03 NOTE — Progress Notes (Signed)
[facility] is [facility].    1. Have you been to the ER, urgent care clinic since your last visit?  Hospitalized [facility] your last visit?[facility]    2. Have you seen or consulted any other health care providers outside of the [facility] since your last visit?  Include any pap smears or colon screening. No

## 2018-01-10 ENCOUNTER — Inpatient Hospital Stay
Admit: 2018-01-10 | Discharge: 2018-01-10 | Disposition: A | Discharge status: Home / Self Care | Payer: PRIVATE HEALTH INSURANCE | Attending: Emergency Medicine

## 2018-01-10 ENCOUNTER — Emergency Department: Admit: 2018-01-10 | Payer: PRIVATE HEALTH INSURANCE | Primary: Internal Medicine

## 2018-01-10 DIAGNOSIS — R5383 Other fatigue: Secondary | ICD-10-CM

## 2018-01-10 LAB — CBC WITH AUTOMATED DIFF
ABS. BASOPHILS: 0 10*3/uL (ref 0.0–0.1)
ABS. EOSINOPHILS: 0.2 10*3/uL (ref 0.0–0.4)
ABS. LYMPHOCYTES: 1.3 10*3/uL (ref 0.9–3.6)
ABS. MONOCYTES: 0.5 10*3/uL (ref 0.05–1.2)
ABS. NEUTROPHILS: 4.3 10*3/uL (ref 1.8–8.0)
BASOPHILS: 0 % (ref 0–2)
EOSINOPHILS: 2 % (ref 0–5)
HCT: 32.5 % — ABNORMAL LOW (ref 35.0–45.0)
HGB: 10.3 g/dL — ABNORMAL LOW (ref 12.0–16.0)
LYMPHOCYTES: 21 % (ref 21–52)
MCH: 25.3 PG (ref 24.0–34.0)
MCHC: 31.7 g/dL (ref 31.0–37.0)
MCV: 79.9 FL (ref 74.0–97.0)
MONOCYTES: 8 % (ref 3–10)
MPV: 10.4 FL (ref 9.2–11.8)
NEUTROPHILS: 69 % (ref 40–73)
PLATELET: 318 10*3/uL (ref 135–420)
RBC: 4.07 M/uL — ABNORMAL LOW (ref 4.20–5.30)
RDW: 15.4 % — ABNORMAL HIGH (ref 11.6–14.5)
WBC: 6.2 10*3/uL (ref 4.6–13.2)

## 2018-01-10 LAB — MAGNESIUM: Magnesium: 1.8 mg/dL (ref 1.6–2.6)

## 2018-01-10 LAB — METABOLIC PANEL, BASIC
Anion gap: 6 mmol/L (ref 3.0–18)
BUN/Creatinine ratio: 14 (ref 12–20)
BUN: 13 MG/DL (ref 7.0–18)
CO2: 33 mmol/L — ABNORMAL HIGH (ref 21–32)
Calcium: 9.7 MG/DL (ref 8.5–10.1)
Chloride: 100 mmol/L (ref 100–108)
Creatinine: 0.93 MG/DL (ref 0.6–1.3)
GFR est AA: >60 mL/min/{1.73_m2} (ref >60)
GFR est non-AA: >60 mL/min/{1.73_m2} (ref >60)
Glucose: 103 mg/dL — ABNORMAL HIGH (ref 74–99)
Potassium: 3.1 mmol/L — ABNORMAL LOW (ref 3.5–5.5)
Sodium: 139 mmol/L (ref 136–145)

## 2018-01-10 LAB — TROPONIN I: Troponin-I, QT: <0.02 NG/ML (ref 0.00–0.06)

## 2018-01-10 NOTE — ED Provider Notes (Signed)
EMERGENCY DEPARTMENT HISTORY AND PHYSICAL EXAM    4:43 PM      Date: 01/10/2018  Patient Name: Elaine [name]    History of Presenting Illness     Chief Complaint   Patient presents with   ??? Dizziness   ??? Fatigue         History Provided By: Patient    Chief Complaint: Light-headedness  Duration:  Today  Timing:  Acute  Location: [facility]  Quality: N/A  Severity: Moderate  Modifying Factors: none reported  Associated Symptoms: Dizziness      4:45 PM Angelea A Fetting is a 67 y.o. female with h/o hypothyroidism and anemia who presents to ED complaining of acute lightheadedness onset today. Patient states she was feeling okay at work this morning. She then experienced dizziness and lightheadedness and was seen [facility] today. Patient was later sent to ED by EMS. Denies pain anywhere and currently feels lightheaded. She denies any prior episodes. Denies any further complaints or symptoms at the moment.      PCP: Thana Farr, MD        Current Outpatient Medications   Medication Sig Dispense Refill   ??? PROAIR HFA 90 mcg/actuation inhaler      ??? valsartan-hydroCHLOROthiazide (DIOVAN-HCT) 160-25 mg per tablet take 1 tablet by mouth once daily 90 Tab 0   ??? atorvastatin (LIPITOR) 40 mg tablet take 1 tablet by mouth once daily 30 Tab 3   ??? omeprazole (PRILOSEC) 40 mg capsule take 1 capsule by mouth once daily BEFORE BREAKFAST 30 Cap 3   ??? levothyroxine (SYNTHROID) 150 mcg tablet take 1 tablet by mouth once daily BEFORE BREAKFAST 30 Tab 3   ??? montelukast (SINGULAIR) 10 mg tablet TAKE 1 TABLET BY MOUTH AT BEDTIME IF NEEDED 90 Tab 1   ??? chlorpheniramine-HYDROcodone (TUSSIONEX) 10-8 mg/5 mL suspension Take 5 mL by mouth every twelve (12) hours as needed for Cough. Max Daily Amount: 10 mL. 240 mL 0   ??? Benzocaine-Menthol (CHLORASEPTIC SORE THROAT) 6-10 mg lozg 1 Lozenge by Mucous Membrane route as needed. 30 Lozenge 0   ??? levothyroxine (SYNTHROID) 150 mcg tablet TAKE 1 TABLET BY MOUTH ONCE  DAILY BEFORE BREAKFAST 30 Tab 1   ??? carvedilol (COREG) 6.25 mg tablet TAKE 1 TABLET BY MOUTH TWO TIMES A DAY WITH MEALS 60 Tab 3   ??? latanoprost (XALATAN) 0.005 % ophthalmic solution      ??? multivitamin (ONE A DAY) tablet Take 1 Tab by mouth daily.     ??? gabapentin (NEURONTIN) 300 mg capsule take 1 capsule by mouth every morning and 2 capsules by mouth at bedtime 180 Cap 1   ??? VARICELLA-ZOSTER VACINE LIVE 19,400 unit/0.65 mL susr injection   0   ??? budesonide-formoterol (SYMBICORT) 160-4.5 mcg/actuation HFA inhaler Take 2 Puffs by inhalation two (2) times a day. 1 Inhaler 5   ??? Cholecalciferol, Vitamin D3, (VITAMIN D-3) 5,000 unit Tab Take  by mouth daily.         Past History     Past Medical History:  Past Medical History:   Diagnosis Date   ??? Anemia    ??? Asthma    ??? Colon polyp    ??? DDD (degenerative disc disease) 02/15/2009   ??? Depression 12/18/2011   ??? Essential hypertension, benign    ??? History of echocardiogram 03/20/2004    EF 70%.  Borderline DDfx.  No significant valvular pathology.   ??? Hypercholesterolemia    ??? Hypertension    ???  Iron deficiency anemia 03/08/271   ??? 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 Lower extremity venous duplex 10/23/2009    Left leg:  No DVT.     ??? Meralgia paraesthetica 10/22/2013   ??? OA (osteoarthritis of spine) 02/15/2009   ??? OA (osteoarthritis of spine) 02/15/2009   ??? Obesity, unspecified    ??? Other and unspecified hyperlipidemia    ??? Pre-operative cardiovascular examination     For spine surgery   ??? Right sided sciatica    ??? Shortness of breath     Possible asthma, HCVD; less likely CAD (Noted 03/15/09)   ??? Sleep apnea 02/15/2009    uses cpap machine   ??? Thallium stress test  03/20/2004    Partially transient, mod basal & mid anterior defect most c/w artifact; mild anterior ischemia less likely.  Neg EKG on pharm stress test.   ??? Thyroid disease     hypothyroidism       Past Surgical History:  Past Surgical History:   Procedure Laterality Date   ??? HX COLONOSCOPY  02-24-12    normal (Dr. Angelique Holm)    ??? HX HERNIA REPAIR      3x   ??? HX HYSTERECTOMY     ??? HX TUBAL LIGATION     ??? NEUROLOGICAL PROCEDURE UNLISTED  03-16-2009    s/p ACD & Fusion (Dr.P.Gurtner)   ??? PR COLONOSCOPY FLX DX W/COLLJ SPEC WHEN PFRMD  02-2007    +polyp(tubular adenoma); Dr Angelique Holm       Family History:  Family History   Problem Relation Age of Onset   ??? Cancer Mother    ??? Hypertension Mother    ??? Hypertension Sister         x3   ??? Heart Surgery Sister    ??? Heart Disease Sister    ??? Heart Attack Sister    ??? Cancer Maternal Aunt         breast   ??? Breast Cancer Maternal Aunt    ??? Glaucoma Son    ??? Heart defect Son    ??? Diabetes Paternal Uncle    ??? Hypertension Paternal Uncle        Social History:  Social History     Tobacco Use   ??? Smoking status: Former Smoker     Years: 28.00     Last attempt to quit: 04/25/2004     Years since quitting: 13.7   ??? Smokeless tobacco: Never Used   Substance Use Topics   ??? Alcohol use: No   ??? Drug use: No       Allergies:  Allergies   Allergen Reactions   ??? Asa-Acetaminophen-Caff-Potass Other (comments)     Bleeding in stomach   ??? Benadryl [Diphenhydramine Hcl] Other (comments)     Muscle jerking   ??? Other Plant, Animal, Environmental Not Reported This Time     Grass, Dust and Mold   ??? Pollen Extracts Not Reported This Time         Review of Systems       Review of Systems   Constitutional: Negative for activity change, fatigue and fever.   HENT: Negative for congestion and rhinorrhea.    Eyes: Negative for visual disturbance.   Respiratory: Negative for shortness of breath.    Cardiovascular: Negative for chest pain and palpitations.   Gastrointestinal: Negative for abdominal pain, diarrhea, nausea and vomiting.   Genitourinary: Negative for dysuria and hematuria.   Musculoskeletal: Negative for back pain.   Skin:  Negative for rash.   Neurological: Positive for dizziness and light-headedness. Negative for weakness.   All other systems reviewed and are negative.        Physical Exam     Visit Vitals  BP 134/56    Pulse 66   Temp 98.1 ??F (36.7 ??C)   Resp 20   Ht 5\' 2"  (1.575 m)   Wt 88.5 kg (195 lb)   SpO2 95%   BMI 35.67 kg/m??         Physical Exam   Constitutional: She is oriented to person, place, and time. She appears well-developed and well-nourished. No distress.   HENT:   Head: Normocephalic and atraumatic.   Right Ear: External ear normal.   Left Ear: External ear normal.   Nose: Nose normal.   Mouth/Throat: Oropharynx is clear and moist.   Eyes: Conjunctivae and EOM are normal. Pupils are equal, round, and reactive to light. No scleral icterus.   Neck: Normal range of motion. Neck supple. No JVD present. No tracheal deviation present. No thyromegaly present.   Cardiovascular: Normal rate, regular rhythm, normal heart sounds and intact distal pulses. Exam reveals no gallop and no friction rub.   No murmur heard.  Pulmonary/Chest: Effort normal and breath sounds normal. She exhibits no tenderness.   Abdominal: Soft. Bowel sounds are normal. She exhibits no distension. There is no tenderness. There is no rebound and no guarding.   Musculoskeletal: Normal range of motion. She exhibits no edema or tenderness.   Lymphadenopathy:     She has no cervical adenopathy.   Neurological: She is alert and oriented to person, place, and time. No cranial nerve deficit. Coordination normal.   No sensory loss, Gait normal, Motor 5/5   Skin: Skin is warm and dry.   Psychiatric: She has a normal mood and affect. Her behavior is normal. Judgment and thought content normal.   Nursing note and vitals reviewed.        Diagnostic Study Results     Labs -  Recent Results (from the past 12 hour(s))   EKG, 12 LEAD, INITIAL    Collection Time: 01/10/18  4:19 PM   Result Value Ref Range    Ventricular Rate 75 BPM    Atrial Rate 75 BPM    P-R Interval 204 ms    QRS Duration 82 ms    Q-T Interval 412 ms    QTC Calculation (Bezet) 460 ms    Calculated P Axis 76 degrees    Calculated R Axis 45 degrees    Calculated T Axis 43 degrees    Diagnosis        Normal sinus rhythm  Normal ECG  When compared with ECG of 10-Jul-2014 11:44,  No significant change was found     METABOLIC PANEL, BASIC    Collection Time: 01/10/18  4:34 PM   Result Value Ref Range    Sodium 139 136 - 145 mmol/L    Potassium 3.1 (L) 3.5 - 5.5 mmol/L    Chloride 100 100 - 108 mmol/L    CO2 33 (H) 21 - 32 mmol/L    Anion gap 6 3.0 - 18 mmol/L    Glucose 103 (H) 74 - 99 mg/dL    BUN 13 7.0 - 18 MG/DL    Creatinine 0.93 0.6 - 1.3 MG/DL    BUN/Creatinine ratio 14 12 - 20      GFR est AA >60 >60 ml/min/1.90m2    GFR est non-AA >60 >60  ml/min/1.42m2    Calcium 9.7 8.5 - 10.1 MG/DL   CBC WITH AUTOMATED DIFF    Collection Time: 01/10/18  4:34 PM   Result Value Ref Range    WBC 6.2 4.6 - 13.2 K/uL    RBC 4.07 (L) 4.20 - 5.30 M/uL    HGB 10.3 (L) 12.0 - 16.0 g/dL    HCT 32.5 (L) 35.0 - 45.0 %    MCV 79.9 74.0 - 97.0 FL    MCH 25.3 24.0 - 34.0 PG    MCHC 31.7 31.0 - 37.0 g/dL    RDW 15.4 (H) 11.6 - 14.5 %    PLATELET 318 135 - 420 K/uL    MPV 10.4 9.2 - 11.8 FL    NEUTROPHILS 69 40 - 73 %    LYMPHOCYTES 21 21 - 52 %    MONOCYTES 8 3 - 10 %    EOSINOPHILS 2 0 - 5 %    BASOPHILS 0 0 - 2 %    ABS. NEUTROPHILS 4.3 1.8 - 8.0 K/UL    ABS. LYMPHOCYTES 1.3 0.9 - 3.6 K/UL    ABS. MONOCYTES 0.5 0.05 - 1.2 K/UL    ABS. EOSINOPHILS 0.2 0.0 - 0.4 K/UL    ABS. BASOPHILS 0.0 0.0 - 0.1 K/UL    DF AUTOMATED     TROPONIN I    Collection Time: 01/10/18  4:34 PM   Result Value Ref Range    Troponin-I, QT <0.02 0.00 - 0.06 NG/ML   MAGNESIUM    Collection Time: 01/10/18  4:34 PM   Result Value Ref Range    Magnesium 1.8 1.6 - 2.6 mg/dL       Radiologic Studies -   CT HEAD WO CONT   Final Result   IMPRESSION:      No acute intracranial findings. Of note, MRI is more sensitive in the detection   of acute infarct.            Medical Decision Making     It should be noted that I, Kowen Kluth, Sharol Given, MD will be the provider of record for this patient.    I reviewed the vital signs, available nursing notes, past medical history,  past surgical history, family history and social history.    Vital Signs-Reviewed the patient's vital signs.    Pulse Oximetry Analysis -  97% on room air     EKG:Interpreted by the EP.   Time Interpreted: 16:49   Rate: 75   Rhythm: Normal Sinus Rhythm     Records Reviewed: Nursing Notes and Old Medical Records (Time of Review: 4:43 PM)    ED Course: Progress Notes, Reevaluation, and Consults:    Provider Notes (Medical Decision Making): Results reviewed with pt, she is feeling "much better" now and wants to go home, pt agrees with dispo and F/U plan.   Chelsea Aus, MD  6:43 PM          Diagnosis     Clinical Impression: No diagnosis found.  Disposition:     Follow-up Information    None             Medication List      ASK your doctor about these medications    atorvastatin 40 mg tablet  Commonly known as:  LIPITOR  take 1 tablet by mouth once daily     benzocaine-menthol 6-10 mg Lozg  Commonly known as:  CHLORASEPTIC SORE THROAT  1 Lozenge by Mucous Membrane route as needed.  budesonide-formoterol 160-4.5 mcg/actuation Hfaa  Commonly known as:  SYMBICORT  Take 2 Puffs by inhalation two (2) times a day.     carvedilol 6.25 mg tablet  Commonly known as:  COREG  TAKE 1 TABLET BY MOUTH TWO TIMES A DAY WITH MEALS     chlorpheniramine-HYDROcodone 10-8 mg/5 mL suspension  Commonly known as:  TUSSIONEX  Take 5 mL by mouth every twelve (12) hours as needed for Cough. Max Daily Amount: 10 mL.     gabapentin 300 mg capsule  Commonly known as:  NEURONTIN  take 1 capsule by mouth every morning and 2 capsules by mouth at bedtime     latanoprost 0.005 % ophthalmic solution  Commonly known as:  XALATAN     * levothyroxine 150 mcg tablet  Commonly known as:  SYNTHROID  TAKE 1 TABLET BY MOUTH ONCE DAILY BEFORE BREAKFAST     * levothyroxine 150 mcg tablet  Commonly known as:  SYNTHROID  take 1 tablet by mouth once daily BEFORE BREAKFAST     montelukast 10 mg tablet  Commonly known as:  SINGULAIR   TAKE 1 TABLET BY MOUTH AT BEDTIME IF NEEDED     multivitamin tablet  Commonly known as:  ONE A DAY     omeprazole 40 mg capsule  Commonly known as:  PRILOSEC  take 1 capsule by mouth once daily BEFORE BREAKFAST     PROAIR HFA 90 mcg/actuation inhaler  Generic drug:  albuterol     valsartan-hydroCHLOROthiazide 160-25 mg per tablet  Commonly known as:  DIOVAN-HCT  take 1 tablet by mouth once daily     varicella-zoster vacine live 19,400 unit/0.65 mL Susr injection  Generic drug:  varicella zoster vaccine live     VITAMIN D3 5,000 unit Tab tablet  Generic drug:  cholecalciferol (VITAMIN D3)         * This list has 2 medication(s) that are the same as other medications prescribed for you. Read the directions carefully, and ask your doctor or other care provider to review them with you.              _______________________________       Scribe Attestation     Russ Halo acting as a scribe for and in the presence of Chelsea Aus, MD      January 10, 2018 at 4:43 PM       Provider Attestation:      I personally performed the services described in the documentation, reviewed the documentation, as recorded by the scribe in my presence, and it accurately and completely records my words and actions. January 10, 2018 at 4:43 PM - Chelsea Aus, MD        _______________________________

## 2018-01-10 NOTE — ED Notes (Signed)
Rounding completed. Patient updated on POC and assisted to bathroom.

## 2018-01-10 NOTE — ED Notes (Signed)
I have reviewed discharge instructions with the patient.  The patient verbalized understanding.    Medication teaching given, to include name, dose, action, and side effects. Patient verbalized understanding of medications. Encouraged patient to voice any concerns with reassurance provided.      Patient armband removed and shredded    Patient Discharged in stable condition.

## 2018-01-10 NOTE — ED Triage Notes (Signed)
Patient reports to ED with complaints of sudden onset of dizziness and fatigue starting today at 10am

## 2018-01-11 ENCOUNTER — Ambulatory Visit
Admit: 2018-01-11 | Discharge: 2018-01-11 | Payer: PRIVATE HEALTH INSURANCE | Attending: Internal Medicine | Primary: Internal Medicine

## 2018-01-11 DIAGNOSIS — G935 Compression of brain: Secondary | ICD-10-CM

## 2018-01-11 LAB — EKG 12-LEAD
Atrial Rate: 75 {beats}/min
Diagnosis: NORMAL
P Axis: 76 degrees
P-R Interval: 204 ms
Q-T Interval: 412 ms
QRS Duration: 82 ms
QTc Calculation (Bazett): 460 ms
R Axis: 45 degrees
T Axis: 43 degrees
Ventricular Rate: 75 {beats}/min

## 2018-01-11 LAB — EKG, 12 LEAD, INITIAL
Atrial Rate: 75 {beats}/min
Calculated P Axis: 76 degrees
Calculated R Axis: 45 degrees
Calculated T Axis: 43 degrees
Diagnosis: NORMAL
P-R Interval: 204 ms
Q-T Interval: 412 ms
QRS Duration: 82 ms
QTC Calculation (Bezet): 460 ms
Ventricular Rate: 75 {beats}/min

## 2018-01-11 NOTE — Progress Notes (Signed)
Patient is in the office today for ED follow up.    1. Have you been to the ER, urgent care clinic since your last visit?  Hospitalized since your last visit?No    2. Have you seen or consulted any other health care providers outside of the [facility] since your last visit?  Include any pap smears or colon screening. No

## 2018-01-11 NOTE — Patient Instructions (Signed)
Stroke: Care Instructions  Your Care Instructions    You have had a stroke. This means that the blood flow to a part of your brain was blocked for some time, which damages the nerve cells in that part of the brain. The part of your body controlled by that part of your brain may not function properly now.  The brain is an amazing organ that can heal itself to some degree. The stroke you had damaged part of your brain. But other parts of your brain may take over in some way for the damaged areas. You have already started this process.  Your doctor will talk with you about what you can do to prevent another stroke. High blood pressure, high cholesterol, and diabetes are all risk factors for stroke. If you have any of these conditions, work with your doctor to make sure they are under control. Other risk factors for stroke include being overweight, smoking, and not getting regular exercise.  Going home may be hard for you and your family. The more you can try to do for yourself, the better. Remember to take each day one at a time.  Follow-up care is a key part of your treatment and safety. Be sure to make and go to all appointments, and call your doctor if you are having problems. It's also a good idea to know your test results and keep a list of the medicines you take.  How can you care for yourself at home?  ?? ?? Enter a stroke rehabilitation (rehab) program, if your doctor recommends it. Physical, speech, and occupational therapies can help you manage bathing, dressing, eating, and other basics of daily living.   ?? ?? Do not drive until your doctor says it is okay.   ?? ?? It is normal to feel sad or depressed after a stroke. If these feelings last, talk to your doctor.   ?? ?? If you are having problems with urine leakage, go to the bathroom at regular times, including when you first wake up and at bedtime. Also, limit fluids after dinner.   ?? ?? If you are constipated, drink plenty of fluids, enough so that your  urine is light yellow or clear like water. If you have kidney, heart, or liver disease and have to limit fluids, talk with your doctor before you increase the amount of fluids you drink. Set up a regular time for using the toilet. If you continue to have constipation, your doctor may suggest using a bulking agent, such as Metamucil, or a stool softener, laxative, or enema.   Medicines  ?? ?? Take your medicines exactly as prescribed. Call your doctor if you think you are having a problem with your medicine. You may be taking several medicines. ACE (angiotensin-converting enzyme) inhibitors, angiotensin II receptor blockers (ARBs), beta-blockers, diuretics (water pills), and calcium channel blockers control your blood pressure. Statins help lower cholesterol. Your doctor may also prescribe medicines for depression, pain, sleep problems, anxiety, or agitation.   ?? ?? If your doctor has given you a blood thinner to prevent another stroke, be sure you get instructions about how to take your medicine safely. Blood thinners can cause serious bleeding problems.   ?? ?? Do not take any over-the-counter medicines or herbal products without talking to your doctor first.   ?? ?? If you take birth control pills or hormone therapy, talk to your doctor about whether they are right for you.   ??For family members and caregivers  ?? ??   Make the home safe. Set up a room so that your loved one does not have to climb stairs. Be sure the bathroom is on the same floor. Move throw rugs and furniture that could cause falls. Make sure that the lighting is good. Put grab bars and seats in tubs and showers.   ?? ?? Find out what your loved one can do and what he or she needs help with. Try not to do things for your loved one that your loved one can do on his or her own. Help him or her learn and practice new skills.   ?? ?? Visit and talk with your loved one often. Try doing activities  together that you both enjoy, such as playing cards or board games. Keep in touch with your loved one's friends as much as you can. Encourage them to visit.   ?? ?? Take care of yourself. Do not try to do everything yourself. Ask other family members to help. Eat well, get enough rest, and take time to do things that you enjoy. Keep up with your own doctor visits, and make sure to take your medicines regularly. Get out of the house as much as you can. Join a local support group. Find out if you qualify for home health care visits to help with rehab or for adult day care.   When should you call for help?  Call 911 anytime you think you may need emergency care. For example, call if:  ?? ?? You have signs of another stroke. These may include:  ? Sudden numbness, tingling, weakness, or loss of movement in your face, arm, or leg, especially on only one side of your body.  ? Sudden vision changes.  ? Sudden trouble speaking.  ? Sudden confusion or trouble understanding simple statements.  ? Sudden problems with walking or balance.  ? A sudden, severe headache that is different from past headaches.  Call 911 even if these symptoms go away in a few minutes.   ??Call your doctor now or seek immediate medical care if:  ?? ?? You have new symptoms that may be related to your stroke, such as falls or trouble swallowing.   ??Watch closely for changes in your health, and be sure to contact your doctor if you have any problems.  Where can you learn more?  Go to http://www.healthwise.net/GoodHelpConnections.  Enter C294 in the search box to learn more about "Stroke: Care Instructions."  Current as of: September 01, 2017  Content Version: 11.9  ?? 2006-2018 [employer], [employer]. Care instructions adapted under license by [employer] (which disclaims liability or warranty for this information). If you have questions about a medical condition or this instruction, always ask your healthcare professional. Healthwise,  Incorporated disclaims any warranty or liability for your use of this information.

## 2018-01-11 NOTE — Progress Notes (Signed)
Elaine [name] is a 67 y.o.  female and presents with ED Follow-up; Syncope (near ); and Malaise      SUBJECTIVE:  Pt again had an episode where she went to work and felt very sleepy after a few hours. She had a similar episode about a week ago where she had a similar incident of uncontrolled somnolence. Pt went to urgent care and was referred to [facility] ER for possible CVA. [name] neuro eval was negative and she had negative CT of her head. Pt has h/o Chiari I malformation and recently saw neurology 12/29/17. Pt has been evaluated by cardiology in the past. Pt has not had recent MRI of the Brain. She tells me Dr Lovie Macadamia Roddrique wanted to do surgery in the past but she had declined.       Respiratory ROS: negative for - shortness of breath  Cardiovascular ROS: negative for - chest pain    Current Outpatient Medications   Medication Sig   ??? PROAIR HFA 90 mcg/actuation inhaler    ??? valsartan-hydroCHLOROthiazide (DIOVAN-HCT) 160-25 mg per tablet take 1 tablet by mouth once daily   ??? atorvastatin (LIPITOR) 40 mg tablet take 1 tablet by mouth once daily   ??? omeprazole (PRILOSEC) 40 mg capsule take 1 capsule by mouth once daily BEFORE BREAKFAST   ??? levothyroxine (SYNTHROID) 150 mcg tablet take 1 tablet by mouth once daily BEFORE BREAKFAST   ??? montelukast (SINGULAIR) 10 mg tablet TAKE 1 TABLET BY MOUTH AT BEDTIME IF NEEDED   ??? chlorpheniramine-HYDROcodone (TUSSIONEX) 10-8 mg/5 mL suspension Take 5 mL by mouth every twelve (12) hours as needed for Cough. Max Daily Amount: 10 mL.   ??? Benzocaine-Menthol (CHLORASEPTIC SORE THROAT) 6-10 mg lozg 1 Lozenge by Mucous Membrane route as needed.   ??? levothyroxine (SYNTHROID) 150 mcg tablet TAKE 1 TABLET BY MOUTH ONCE DAILY BEFORE BREAKFAST   ??? carvedilol (COREG) 6.25 mg tablet TAKE 1 TABLET BY MOUTH TWO TIMES A DAY WITH MEALS   ??? latanoprost (XALATAN) 0.005 % ophthalmic solution    ??? multivitamin (ONE A DAY) tablet Take 1 Tab by mouth daily.    ??? gabapentin (NEURONTIN) 300 mg capsule take 1 capsule by mouth every morning and 2 capsules by mouth at bedtime   ??? budesonide-formoterol (SYMBICORT) 160-4.5 mcg/actuation HFA inhaler Take 2 Puffs by inhalation two (2) times a day.   ??? Cholecalciferol, Vitamin D3, (VITAMIN D-3) 5,000 unit Tab Take  by mouth daily.   ??? VARICELLA-ZOSTER VACINE LIVE 19,400 unit/0.65 mL susr injection      No current facility-administered medications for this visit.          OBJECTIVE:  alert, well appearing, and in no distress  Visit Vitals  BP 117/64 (BP 1 Location: Left arm, BP Patient Position: Sitting)   Pulse 96   Temp 98.7 ??F (37.1 ??C) (Oral)   Resp 20   Ht '5\' 2"'$  (1.575 m)   Wt 193 lb (87.5 kg)   SpO2 94%   BMI 35.30 kg/m??      well developed and well nourished  Chest - clear to auscultation, no wheezes, rales or rhonchi, symmetric air entry  Heart - normal rate, regular rhythm, normal S1, S2, no murmurs, rubs, clicks or gallops  Neurological - alert, oriented, normal speech, no focal findings or movement disorder noted    Labs:   Lab Results   Component Value Date/Time    WBC 6.2 01/10/2018 04:34 PM    HGB 10.3 (L) 01/10/2018 04:34 PM  HCT 32.5 (L) 01/10/2018 04:34 PM    PLATELET 318 01/10/2018 04:34 PM    MCV 79.9 01/10/2018 04:34 PM     Lab Results   Component Value Date/Time    TSH 0.08 (L) 08/18/2017 09:39 AM    T4, Free 1.3 03/29/2017 05:30 PM      Lab Results   Component Value Date/Time    Sodium 139 01/10/2018 04:34 PM    Potassium 3.1 (L) 01/10/2018 04:34 PM    Chloride 100 01/10/2018 04:34 PM    CO2 33 (H) 01/10/2018 04:34 PM    Anion gap 6 01/10/2018 04:34 PM    Glucose 103 (H) 01/10/2018 04:34 PM    BUN 13 01/10/2018 04:34 PM    Creatinine 0.93 01/10/2018 04:34 PM    BUN/Creatinine ratio 14 01/10/2018 04:34 PM    GFR est AA >60 01/10/2018 04:34 PM    GFR est non-AA >60 01/10/2018 04:34 PM    Calcium 9.7 01/10/2018 04:34 PM    Bilirubin, total 0.5 10/24/2016 07:00 AM    ALT (SGPT) 28 10/24/2016 07:00 AM     AST (SGOT) 25 10/24/2016 07:00 AM    Alk. phosphatase 81 10/24/2016 07:00 AM    Protein, total 7.1 10/24/2016 07:00 AM    Albumin 3.7 10/24/2016 07:00 AM    Globulin 3.4 10/24/2016 07:00 AM    A-G Ratio 1.1 10/24/2016 07:00 AM        Assessment/Plan      ICD-10-CM ICD-9-CM    1. Chiari I malformation ([place]) G93.5 348.4 Will check MRI BRAIN WO CONT   2. Near syncope R55 780.2 MRI BRAIN WO CONT   3. Hypersomnolence G47.10 780.54 If MRI BRAIN WO CONT shows no progression of #1 consider Sleep Apnea evaluation      Follow-up Disposition:  Return if symptoms worsen or fail to improve.    Reviewed plan of care. Patient has provided input and agrees with goals.

## 2018-01-20 NOTE — Telephone Encounter (Signed)
Patient called requesting a phone call back in reference to getting an appointment scheduled to have an MRI. Please advise [phone]

## 2018-01-21 NOTE — Telephone Encounter (Signed)
Patient is aware Central Scheduling will get test authorized through insurance and call her.  Patient was given the phone number to call and schedule test.

## 2018-01-21 NOTE — Telephone Encounter (Signed)
Attempted to call patient voice mail states patient is unavailable at this time.

## 2018-01-21 NOTE — Telephone Encounter (Signed)
Attempted to call patient again and unable to leave a message.

## 2018-01-31 ENCOUNTER — Telehealth

## 2018-01-31 ENCOUNTER — Encounter: Attending: Internal Medicine | Primary: Internal Medicine

## 2018-01-31 MED ORDER — DIAZEPAM 5 MG TAB
5 mg | ORAL_TABLET | ORAL | 0 refills | Status: DC
Start: 2018-01-31 — End: 2018-03-07

## 2018-01-31 NOTE — Telephone Encounter (Signed)
Patient calling would like to know if dr Madelyn Brunner can call her something in to calm her for her MRI tomorrow.    pls advise

## 2018-01-31 NOTE — Telephone Encounter (Signed)
Valium called in as directed. Left message for patient to call the office.    Re: Valium called into pharmacy for MRI.

## 2018-01-31 NOTE — Telephone Encounter (Signed)
Ok to call in Valium

## 2018-02-01 ENCOUNTER — Encounter: Primary: Internal Medicine

## 2018-02-01 ENCOUNTER — Inpatient Hospital Stay: Admit: 2018-02-01 | Payer: PRIVATE HEALTH INSURANCE | Attending: Internal Medicine | Primary: Internal Medicine

## 2018-02-01 ENCOUNTER — Encounter: Attending: Emergency Medicine | Primary: Internal Medicine

## 2018-02-01 ENCOUNTER — Ambulatory Visit

## 2018-02-01 DIAGNOSIS — G935 Compression of brain: Secondary | ICD-10-CM

## 2018-02-08 ENCOUNTER — Encounter: Attending: Internal Medicine | Primary: Internal Medicine

## 2018-02-15 MED ORDER — GABAPENTIN 300 MG CAP
300 mg | ORAL_CAPSULE | ORAL | 0 refills | Status: DC
Start: 2018-02-15 — End: 2018-09-12

## 2018-02-15 NOTE — Telephone Encounter (Signed)
Medication refilled and sent to pharmacy. Please call and notify pt.

## 2018-02-16 DIAGNOSIS — Z23 Encounter for immunization: Secondary | ICD-10-CM | POA: Diagnosis not present

## 2018-02-16 DIAGNOSIS — Z0000 Encounter for general adult medical examination without abnormal findings: Secondary | ICD-10-CM | POA: Diagnosis not present

## 2018-02-16 DIAGNOSIS — Z7984 Long term (current) use of oral hypoglycemic drugs: Secondary | ICD-10-CM | POA: Diagnosis not present

## 2018-02-16 DIAGNOSIS — E119 Type 2 diabetes mellitus without complications: Secondary | ICD-10-CM | POA: Diagnosis not present

## 2018-02-16 DIAGNOSIS — K219 Gastro-esophageal reflux disease without esophagitis: Secondary | ICD-10-CM | POA: Diagnosis not present

## 2018-02-16 DIAGNOSIS — E785 Hyperlipidemia, unspecified: Secondary | ICD-10-CM | POA: Diagnosis not present

## 2018-02-16 DIAGNOSIS — M109 Gout, unspecified: Secondary | ICD-10-CM | POA: Diagnosis not present

## 2018-02-16 DIAGNOSIS — I10 Essential (primary) hypertension: Secondary | ICD-10-CM | POA: Diagnosis not present

## 2018-03-07 ENCOUNTER — Ambulatory Visit
Admit: 2018-03-07 | Discharge: 2018-03-07 | Payer: PRIVATE HEALTH INSURANCE | Attending: Internal Medicine | Primary: Internal Medicine

## 2018-03-07 ENCOUNTER — Inpatient Hospital Stay: Admit: 2018-03-08 | Payer: PRIVATE HEALTH INSURANCE | Primary: Internal Medicine

## 2018-03-07 DIAGNOSIS — E890 Postprocedural hypothyroidism: Secondary | ICD-10-CM

## 2018-03-07 DIAGNOSIS — I10 Essential (primary) hypertension: Secondary | ICD-10-CM

## 2018-03-07 NOTE — Progress Notes (Signed)
Elaine [name] is a 67 y.o.  female and presents with Dizziness (Check MRI); Vitamin D Deficiency; Thyroid Problem; Diabetes; Cholesterol Problem; and Hypertension      SUBJECTIVE:  [name] HTN is well controlled on DiovanHct 160/25. Pt had some concerns about the recall. I advised her to talk to her pharmacist and if she wants a new medication I will place her on one.  [name] high cholesterol is well controlled on Lipitor 40 mg. Pt  denies any side effects like myalgias. Pt is controlling her prediabetes with diet and weight loss.      Thyroid Review:  Patient is seen for followup of hypothyroidism.   Thyroid ROS: denies fatigue, weight changes, heat/cold intolerance, bowel/skin changes or CVS symptoms. 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    [name] vit D level is well controlled on vit D 2000 units/day.     [name] presyncopal episodes have resolved and MRI of brain does not show any new pathology.     Respiratory ROS: negative for - shortness of breath  Cardiovascular ROS: negative for - chest pain    Current Outpatient Medications   Medication Sig   ??? gabapentin (NEURONTIN) 300 mg capsule take 1 capsule by mouth every morning AND 2 CAPSULES AT BEDTIME   ??? PROAIR HFA 90 mcg/actuation inhaler    ??? valsartan-hydroCHLOROthiazide (DIOVAN-HCT) 160-25 mg per tablet take 1 tablet by mouth once daily   ??? atorvastatin (LIPITOR) 40 mg tablet take 1 tablet by mouth once daily   ??? omeprazole (PRILOSEC) 40 mg capsule take 1 capsule by mouth once daily BEFORE BREAKFAST   ??? montelukast (SINGULAIR) 10 mg tablet TAKE 1 TABLET BY MOUTH AT BEDTIME IF NEEDED   ??? levothyroxine (SYNTHROID) 150 mcg tablet TAKE 1 TABLET BY MOUTH ONCE DAILY BEFORE BREAKFAST   ??? latanoprost (XALATAN) 0.005 % ophthalmic solution    ??? multivitamin (ONE A DAY) tablet Take 1 Tab by mouth daily.   ??? gabapentin (NEURONTIN) 300 mg capsule take 1 capsule by mouth every  morning and 2 capsules by mouth at bedtime   ??? VARICELLA-ZOSTER VACINE LIVE 19,400 unit/0.65 mL susr injection    ??? budesonide-formoterol (SYMBICORT) 160-4.5 mcg/actuation HFA inhaler Take 2 Puffs by inhalation two (2) times a day.   ??? Cholecalciferol, Vitamin D3, (VITAMIN D-3) 5,000 unit Tab Take  by mouth daily.     No current facility-administered medications for this visit.          OBJECTIVE:  alert, well appearing, and in no distress  Visit Vitals  BP 105/53   Pulse 86   Temp 99 ??F (37.2 ??C) (Oral)   Resp 18   Ht '5\' 2"'$  (1.575 m)   Wt 190 lb (86.2 kg)   SpO2 94%   BMI 34.75 kg/m??      well developed and well nourished  Chest - clear to auscultation, no wheezes, rales or rhonchi, symmetric air entry  Heart - normal rate, regular rhythm, normal S1, S2, no murmurs, rubs, clicks or gallops  Extremities - peripheral pulses normal, no pedal edema, no clubbing or cyanosis    Labs:   Lab Results   Component Value Date/Time    Cholesterol, total 156 02/17/2016 12:00 AM    HDL Cholesterol 49 02/17/2016 12:00 AM    LDL, calculated 82 02/17/2016 12:00 AM    Triglyceride 124 02/17/2016 12:00 AM    CHOL/HDL Ratio 3.4 06/27/2009 08:49 AM  Lab Results   Component Value Date/Time    Sodium 139 01/10/2018 04:34 PM    Potassium 3.1 (L) 01/10/2018 04:34 PM    Chloride 100 01/10/2018 04:34 PM    CO2 33 (H) 01/10/2018 04:34 PM    Anion gap 6 01/10/2018 04:34 PM    Glucose 103 (H) 01/10/2018 04:34 PM    BUN 13 01/10/2018 04:34 PM    Creatinine 0.93 01/10/2018 04:34 PM    BUN/Creatinine ratio 14 01/10/2018 04:34 PM    GFR est AA >60 01/10/2018 04:34 PM    GFR est non-AA >60 01/10/2018 04:34 PM    Calcium 9.7 01/10/2018 04:34 PM    Bilirubin, total 0.5 10/24/2016 07:00 AM    ALT (SGPT) 28 10/24/2016 07:00 AM    AST (SGOT) 25 10/24/2016 07:00 AM    Alk. phosphatase 81 10/24/2016 07:00 AM    Protein, total 7.1 10/24/2016 07:00 AM    Albumin 3.7 10/24/2016 07:00 AM    Globulin 3.4 10/24/2016 07:00 AM     A-G Ratio 1.1 10/24/2016 07:00 AM      Lab Results   Component Value Date/Time    Hemoglobin A1c 5.8 (H) 10/24/2016 10:00 AM      Labs:   Lab Results   Component Value Date/Time    TSH 0.08 (L) 08/18/2017 09:39 AM    T4, Free 1.3 03/29/2017 05:30 PM          Discussed the patient's BMI with her.  The BMI follow up plan is as follows: I have counseled this patient on diet and exercise regimens.        Assessment/Plan      ICD-10-CM ICD-9-CM    1. HTN (hypertension), benign I10 401.1 Well controlled on DiovanHct   2. Hypercholesteremia E78.00 272.0 Pt remains on Lipitor 40 mg, Will check LIPID PANEL today       METABOLIC PANEL, COMPREHENSIVE   3. Acquired hypothyroidism E03.9 244.9 Has been Well controlled on current dose of synthroid. Will check TSH 3RD GENERATION today    4. Pre-diabetes R73.03 790.29 Controlled current with diet and weight loss. Check HEMOGLOBIN A1C W/O EAG today    5. Vitamin D deficiency E55.9 268.9 Status unknown. On Vit D 2000 units/day. Will check VITAMIN D, 25 HYDROXY     Follow-up and Dispositions    ?? Return in about 6 months (around 09/06/2018) for OV, and Medicare Wellness Visit, labs 1 week before.           Reviewed plan of care. Patient has provided input and agrees with goals.

## 2018-03-07 NOTE — Progress Notes (Signed)
Elaine [name] is a  67 y.o. female presents today for office visit for routine follow up.     1. Have you been to the ER, urgent care clinic or hospitalized since your last visit?NO      2. Have you seen or consulted any other health care providers outside of the [facility] since your last visit (Include any pap smears or colon screening)? NO

## 2018-03-07 NOTE — Patient Instructions (Signed)
Learning About Diuretics for High Blood Pressure  Introduction  Diuretics help to lower blood pressure. This reduces your risk of a heart attack and stroke. It also reduces your risk of kidney disease.  Diuretics cause your kidneys to remove sodium and water. They also relax the blood vessel walls. These help lower your blood pressure.  Examples  ?? Chlorthalidone  ?? Hydrochlorothiazide  Possible side effects  There are some common side effects. They are:  ?? Too little potassium.  ?? Feeling dizzy.  ?? Rash.  ?? Urinating a lot.  ?? High blood sugar. (But this is not common.)  You may have other side effects. Check the information that comes with your medicine.  What to know about taking this medicine  ?? You may take other medicines for blood pressure. Diuretics can help those work better. They can also prevent extra fluid in your body.  ?? You may need to take potassium pills. Or you may have to watch how much potassium is in your food. Ask your doctor about this.  ?? You may need blood tests to check your kidneys and your potassium level.  ?? Take your medicines exactly as prescribed. Call your doctor if you think you are having a problem with your medicine.  ?? Check with your doctor or pharmacist before you use any other medicines. This includes over-the-counter medicines. Make sure your doctor knows all of the medicines, vitamins, herbal products, and supplements you take. Taking some medicines together can cause problems.  Where can you learn more?  Go to http://www.healthwise.net/GoodHelpConnections.  Enter X632 in the search box to learn more about "Learning About Diuretics for High Blood Pressure."  Current as of: June 27, 2017  Content Version: 11.9  ?? 2006-2018 [employer]wise, Inc[employer]. Care instructions adapted under license by [employer] (which disclaims liability or warranty for this information). If you have questions about a medical condition or  this instruction, always ask your healthcare professional. Healthwise, Incorporated disclaims any warranty or liability for your use of this information.

## 2018-03-08 ENCOUNTER — Other Ambulatory Visit: Payer: MEDICARE | Primary: Internal Medicine

## 2018-03-08 LAB — HEMOGLOBIN A1C W/O EAG: Hemoglobin A1c: 6.6 % — ABNORMAL HIGH (ref 4.2–5.6)

## 2018-03-09 LAB — LIPID PANEL
CHOL/HDL Ratio: 2.9 (ref 0–5.0)
Cholesterol, total: 161 MG/DL (ref <200)
HDL Cholesterol: 56 MG/DL (ref 40–60)
LDL, calculated: 82 MG/DL (ref 0–100)
Triglyceride: 115 MG/DL (ref <150)
VLDL, calculated: 23 MG/DL

## 2018-03-09 LAB — METABOLIC PANEL, COMPREHENSIVE
A-G Ratio: 1.2 (ref 0.8–1.7)
ALT (SGPT): 21 U/L (ref 13–56)
AST (SGOT): 16 U/L (ref 15–37)
Albumin: 3.7 g/dL (ref 3.4–5.0)
Alk. phosphatase: 86 U/L (ref 45–117)
Anion gap: 7 mmol/L (ref 3.0–18)
BUN/Creatinine ratio: 8 — ABNORMAL LOW (ref 12–20)
BUN: 6 MG/DL — ABNORMAL LOW (ref 7.0–18)
Bilirubin, total: 0.6 MG/DL (ref 0.2–1.0)
CO2: 29 mmol/L (ref 21–32)
Calcium: 9 MG/DL (ref 8.5–10.1)
Chloride: 104 mmol/L (ref 100–108)
Creatinine: 0.76 MG/DL (ref 0.6–1.3)
GFR est AA: >60 mL/min/{1.73_m2} (ref >60)
GFR est non-AA: >60 mL/min/{1.73_m2} (ref >60)
Globulin: 3.1 g/dL (ref 2.0–4.0)
Glucose: 104 mg/dL — ABNORMAL HIGH (ref 74–99)
Potassium: 3.4 mmol/L — ABNORMAL LOW (ref 3.5–5.5)
Protein, total: 6.8 g/dL (ref 6.4–8.2)
Sodium: 140 mmol/L (ref 136–145)

## 2018-03-09 LAB — VITAMIN D, 25 HYDROXY: Vitamin D 25-Hydroxy: 64.3 ng/mL (ref 30–100)

## 2018-03-09 LAB — TSH 3RD GENERATION: TSH: 2.6 u[IU]/mL (ref 0.36–3.74)

## 2018-03-11 ENCOUNTER — Encounter

## 2018-03-11 MED ORDER — VALSARTAN-HYDROCHLOROTHIAZIDE 160 MG-25 MG TAB
160-25 mg | ORAL_TABLET | ORAL | 0 refills | Status: DC
Start: 2018-03-11 — End: 2018-07-02

## 2018-03-11 NOTE — Telephone Encounter (Signed)
Patient called requesting to have her Physical Form, that she dropped off when she had her appointment on 03/07/18, to have be faxed to [phone]. Any questions please contact [phone].

## 2018-03-11 NOTE — Telephone Encounter (Signed)
Need to follow-up to give any future refills

## 2018-03-11 NOTE — Telephone Encounter (Signed)
Follow up is scheduled for Monday, May 09, 2018 02:15 PM. Mailed appointment to patient.

## 2018-03-11 NOTE — Telephone Encounter (Signed)
Patient is aware her form is completed.  Patient states she will have to have a PPD placed.  Patient was advised to coe during the week PPD can be placed Mon - Wed.  Patient verbalizes understanding.

## 2018-03-14 MED ORDER — LEVOTHYROXINE 150 MCG TAB
150 mcg | ORAL_TABLET | ORAL | 1 refills | Status: DC
Start: 2018-03-14 — End: 2019-01-27

## 2018-03-14 MED ORDER — MONTELUKAST 10 MG TAB
10 mg | ORAL_TABLET | ORAL | 1 refills | Status: DC
Start: 2018-03-14 — End: 2018-04-11

## 2018-03-14 NOTE — Telephone Encounter (Signed)
Last OV  03/07/2018  Next OV 09/06/2018  Last refill   Levothyroxine 06/10/2017  Singular 08/09/2017

## 2018-03-21 ENCOUNTER — Institutional Professional Consult (permissible substitution): Admit: 2018-03-21 | Discharge: 2018-03-21 | Payer: PRIVATE HEALTH INSURANCE | Primary: Internal Medicine

## 2018-03-21 DIAGNOSIS — Z111 Encounter for screening for respiratory tuberculosis: Secondary | ICD-10-CM

## 2018-03-21 NOTE — Progress Notes (Signed)
PPD Placement note  Elaine [name], 67 y.o. female is here today for placement of PPD test  Reason for PPD test: Work  Pt taken PPD test before: yes  Verified in allergy area and with patient that they are not allergic to the products PPD is made of (Phenol or Tween). Yes.  Patient states not allergy  Is patient taking any oral or IV steroid medication now or have they taken it in the last month? no  Has the patient ever received the BCG vaccine?: no  Has the patient been in recent contact with anyone known or suspected of having active TB disease?: no       Patient's Country of origin?: [place]  O: Alert and oriented  P:  PPD placed on 03/21/2018 in right fore arm.  Patient was observed and no signs orsymptoms of allergic reaction noted at this time. Patient advised to return for reading within 48-72 hours.

## 2018-03-21 NOTE — Patient Instructions (Signed)
Tuberculin Skin Test: Care Instructions  Your Care Instructions    Tuberculosis (TB) is a bacterial infection that can damage the lungs or other parts of the body. The TB skin test can tell if you have TB bacteria in your body. Many people are exposed to TB and test positive for TB bacteria in their bodies, but they don't get the disease. TB bacteria can stay in your body without making you sick. This is because your immune system can keep TB in check.  Your doctor may want you to have a TB skin test if you have been in close contact with someone who has TB. Or you may need the test if you have symptoms that might be caused by TB, such as a cough that does not go away, a fever, or weight loss. You also may get the test if you are a health care worker.  During the skin test, part of a TB bacterium is injected under your skin. The test will feel like a skin prick. If you have TB bacteria in your body, a firm red bump will form at the shot site within 2 days. If the test shows that you are infected with TB (positive), your doctor probably will order more tests. A TB-positive skin test can't tell when you became infected with TB. And it can't tell whether the infection can be passed to others.  Follow-up care is a key part of your treatment and safety. Be sure to make and go to all appointments, and call your doctor if you are having problems. It's also a good idea to know your test results and keep a list of the medicines you take.  How can you care for yourself at home?  ?? Do not scratch the test site. Scratching it may cause redness or swelling. This could affect the test results.  ?? To ease itching, put a cold washcloth on the site. Then pat the site dry.  ?? Do not cover the test site with a bandage or other dressing.  ?? Go back to your doctor's office or hospital to have the test read on the follow-up date. This must be done between 48 and 72 hours after you get the shot.  When should you call for help?   Watch closely for changes in your health, and be sure to contact your doctor if you have any problems.  Where can you learn more?  Go to http://www.healthwise.net/GoodHelpConnections.  Enter J474 in the search box to learn more about "Tuberculin Skin Test: Care Instructions."  Current as of: July 05, 2017  Content Version: 11.9  ?? 2006-2018 [employer]. Care instructions adapted under license by [employer] (which disclaims liability or warranty for this information). If you have questions about a medical condition or this instruction, always ask your healthcare professional. [employer], Inc[employer] disclaims any warranty or liability for your use of this information.

## 2018-03-21 NOTE — Telephone Encounter (Signed)
Last OV  03/07/2018  Next OV 09/06/2018  Last refill 02/15/2018

## 2018-03-22 MED ORDER — GABAPENTIN 300 MG CAP
300 mg | ORAL_CAPSULE | ORAL | 1 refills | Status: DC
Start: 2018-03-22 — End: 2018-05-09

## 2018-03-23 LAB — AMB POC TUBERCULOSIS, INTRADERMAL (SKIN TEST)
PPD: NEGATIVE Negative
mm Induration: 0 mm (ref 0–5)

## 2018-04-11 MED ORDER — MONTELUKAST 10 MG TAB
10 mg | ORAL_TABLET | ORAL | 1 refills | Status: DC
Start: 2018-04-11 — End: 2018-06-27

## 2018-04-11 NOTE — Telephone Encounter (Signed)
lov   03/07/2018  Nov 09/06/2018

## 2018-04-13 ENCOUNTER — Encounter

## 2018-04-13 MED ORDER — ATORVASTATIN 40 MG TAB
40 mg | ORAL_TABLET | ORAL | 5 refills | Status: DC
Start: 2018-04-13 — End: 2018-12-29

## 2018-04-13 NOTE — Telephone Encounter (Signed)
Last OV  03/07/2018  Next OV 09/06/2018  Last refill 12/01/2017

## 2018-04-28 ENCOUNTER — Ambulatory Visit
Admit: 2018-04-28 | Discharge: 2018-04-28 | Payer: PRIVATE HEALTH INSURANCE | Attending: Internal Medicine | Primary: Internal Medicine

## 2018-04-28 ENCOUNTER — Ambulatory Visit: Attending: Internal Medicine | Primary: Internal Medicine

## 2018-04-28 DIAGNOSIS — G8929 Other chronic pain: Secondary | ICD-10-CM

## 2018-04-28 DIAGNOSIS — Z780 Asymptomatic menopausal state: Secondary | ICD-10-CM | POA: Diagnosis not present

## 2018-04-28 DIAGNOSIS — M545 Low back pain, unspecified: Secondary | ICD-10-CM

## 2018-04-28 NOTE — Progress Notes (Signed)
Patient is in the office today for back pain.    1. Have you been to the ER, urgent care clinic since your last visit?  Hospitalized since your last visit?No    2. Have you seen or consulted any other health care providers outside of the [facility] since your last visit?  Include any pap smears or colon screening. No

## 2018-04-28 NOTE — Progress Notes (Signed)
Elaine [name] is a 67 y.o.  female and presents with Back Pain      SUBJECTIVE:    Back Pain  Patient presents for presents evaluation of low back problems.  Symptoms have been present for several years and include pain in lower back (aching in character; 5/10 in severity). Initial inciting event: none. Symptoms are worst: morning. Alleviating factors identifiable by patient are recumbency. Exacerbating factors identifiable by patient are standing, walking. Treatments so far initiated by patient: neurontin Previous lower back problems: yes. Previous workup: was seen by [facility]. Previous treatments: epidural injections.      Respiratory ROS: negative for - shortness of breath  Cardiovascular ROS: negative for - chest pain    Current Outpatient Medications   Medication Sig   ??? atorvastatin (LIPITOR) 40 mg tablet take 1 tablet by mouth once daily   ??? montelukast (SINGULAIR) 10 mg tablet TAKE 1 TABLET BY MOUTH AT BEDTIME IF NEEDED   ??? gabapentin (NEURONTIN) 300 mg capsule take 1 capsule by mouth every morning and 2 capsules by mouth at bedtime   ??? levothyroxine (SYNTHROID) 150 mcg tablet TAKE 1 TABLET BY MOUTH ONCE DAILY BEFORE BREAKFAST   ??? valsartan-hydroCHLOROthiazide (DIOVAN-HCT) 160-25 mg per tablet take 1 tablet by mouth once daily   ??? gabapentin (NEURONTIN) 300 mg capsule take 1 capsule by mouth every morning AND 2 CAPSULES AT BEDTIME   ??? PROAIR HFA 90 mcg/actuation inhaler    ??? omeprazole (PRILOSEC) 40 mg capsule take 1 capsule by mouth once daily BEFORE BREAKFAST   ??? latanoprost (XALATAN) 0.005 % ophthalmic solution    ??? multivitamin (ONE A DAY) tablet Take 1 Tab by mouth daily.   ??? VARICELLA-ZOSTER VACINE LIVE 19,400 unit/0.65 mL susr injection    ??? budesonide-formoterol (SYMBICORT) 160-4.5 mcg/actuation HFA inhaler Take 2 Puffs by inhalation two (2) times a day.   ??? Cholecalciferol, Vitamin D3, (VITAMIN D-3) 5,000 unit Tab Take  by mouth daily.     No current facility-administered medications for this  visit.          OBJECTIVE:  alert, well appearing, and in no distress  Visit Vitals  BP 143/75 (BP 1 Location: Left arm, BP Patient Position: Sitting)   Pulse 88   Temp 98.4 ??F (36.9 ??C) (Oral)   Resp 20   Ht 5\' 2"  (1.575 m)   Wt 188 lb (85.3 kg)   SpO2 98%   BMI 34.39 kg/m??      well developed and well nourished          Assessment/Plan      ICD-10-CM ICD-9-CM    1. Chronic midline low back pain without sciatica M54.5 724.2 Pt continues on Neurontin and OTC NSAIDs. Pt unable to walk 2 miles that her job wants her to do.     G89.29 338.29      Follow-up and Dispositions    ?? Return if symptoms worsen or fail to improve.           Reviewed plan of care. Patient has provided input and agrees with goals.

## 2018-04-28 NOTE — Progress Notes (Signed)
Elaine [name] is a 68 y.o.  female and presents with Back Pain      SUBJECTIVE:    Back Pain  Patient presents for presents evaluation of low back problems.  Symptoms have been present for several years and include pain in lower back (aching in character; 5/10 in severity). Initial inciting event: none. Symptoms are worst: morning. Alleviating factors identifiable by patient are recumbency. Exacerbating factors identifiable by patient are standing, walking. Treatments so far initiated by patient: neurontin Previous lower back problems: yes. Previous workup: was seen by [facility]. Previous treatments: epidural injections.      Respiratory ROS: negative for - shortness of breath  Cardiovascular ROS: negative for - chest pain    Current Outpatient Medications   Medication Sig   ??? atorvastatin (LIPITOR) 40 mg tablet take 1 tablet by mouth once daily   ??? montelukast (SINGULAIR) 10 mg tablet TAKE 1 TABLET BY MOUTH AT BEDTIME IF NEEDED   ??? gabapentin (NEURONTIN) 300 mg capsule take 1 capsule by mouth every morning and 2 capsules by mouth at bedtime   ??? levothyroxine (SYNTHROID) 150 mcg tablet TAKE 1 TABLET BY MOUTH ONCE DAILY BEFORE BREAKFAST   ??? valsartan-hydroCHLOROthiazide (DIOVAN-HCT) 160-25 mg per tablet take 1 tablet by mouth once daily   ??? gabapentin (NEURONTIN) 300 mg capsule take 1 capsule by mouth every morning AND 2 CAPSULES AT BEDTIME   ??? PROAIR HFA 90 mcg/actuation inhaler    ??? omeprazole (PRILOSEC) 40 mg capsule take 1 capsule by mouth once daily BEFORE BREAKFAST   ??? latanoprost (XALATAN) 0.005 % ophthalmic solution    ??? multivitamin (ONE A DAY) tablet Take 1 Tab by mouth daily.   ??? VARICELLA-ZOSTER VACINE LIVE 19,400 unit/0.65 mL susr injection    ??? budesonide-formoterol (SYMBICORT) 160-4.5 mcg/actuation HFA inhaler Take 2 Puffs by inhalation two (2) times a day.   ??? Cholecalciferol, Vitamin D3, (VITAMIN D-3) 5,000 unit Tab Take  by mouth daily.      No current facility-administered medications for this visit.          OBJECTIVE:  alert, well appearing, and in no distress  Visit Vitals  BP 143/75 (BP 1 Location: Left arm, BP Patient Position: Sitting)   Pulse 88   Temp 98.4 ??F (36.9 ??C) (Oral)   Resp 20   Ht 5\' 2"  (1.575 m)   Wt 188 lb (85.3 kg)   SpO2 98%   BMI 34.39 kg/m??      well developed and well nourished          Assessment/Plan      ICD-10-CM ICD-9-CM    1. Chronic midline low back pain without sciatica M54.5 724.2 Pt continues on Neurontin and OTC NSAIDs. Pt unable to walk 2 miles that her job wants her to do.     G89.29 338.29      Follow-up and Dispositions    ?? Return if symptoms worsen or fail to improve.           Reviewed plan of care. Patient has provided input and agrees with goals.

## 2018-04-28 NOTE — Progress Notes (Signed)
Patient is in the office today for back pain.    1. Have you been to the ER, urgent care clinic since your last visit?  Hospitalized since your last visit?No    2. Have you seen or consulted any other health care providers outside of the [facility] since your last visit?  Include any pap smears or colon screening. No

## 2018-04-28 NOTE — Patient Instructions (Signed)
Back Pain: Care Instructions  Your Care Instructions    Back pain has many possible causes. It is often related to problems with muscles and ligaments of the back. It may also be related to problems with the nerves, discs, or bones of the back. Moving, lifting, standing, sitting, or sleeping in an awkward way can strain the back. Sometimes you don't notice the injury until later. Arthritis is another common cause of back pain.  Although it may hurt a lot, back pain usually improves on its own within several weeks. Most people recover in 12 weeks or less. Using good home treatment and being careful not to stress your back can help you feel better sooner.  Follow-up care is a key part of your treatment and safety. Be sure to make and go to all appointments, and call your doctor if you are having problems. It's also a good idea to know your test results and keep a list of the medicines you take.  How can you care for yourself at home?  ?? Sit or lie in positions that are most comfortable and reduce your pain. Try one of these positions when you lie down:  ? Lie on your back with your knees bent and supported by large pillows.  ? Lie on the floor with your legs on the seat of a sofa or chair.  ? Lie on your side with your knees and hips bent and a pillow between your legs.  ? Lie on your stomach if it does not make pain worse.  ?? Do not sit up in bed, and avoid soft couches and twisted positions. Bed rest can help relieve pain at first, but it delays healing. Avoid bed rest after the first day of back pain.  ?? Change positions every 30 minutes. If you must sit for long periods of time, take breaks from sitting. Get up and walk around, or lie in a comfortable position.  ?? Try using a heating pad on a low or medium setting for 15 to 20 minutes every 2 or 3 hours. Try a warm shower in place of one session with the heating pad.  ?? You can also try an ice pack for 10 to 15 minutes every 2 to 3 hours.  Put a thin cloth between the ice pack and your skin.  ?? Take pain medicines exactly as directed.  ? If the doctor gave you a prescription medicine for pain, take it as prescribed.  ? If you are not taking a prescription pain medicine, ask your doctor if you can take an over-the-counter medicine.  ?? Take short walks several times a day. You can start with 5 to 10 minutes, 3 or 4 times a day, and work up to longer walks. Walk on level surfaces and avoid hills and stairs until your back is better.  ?? Return to work and other activities as soon as you can. Continued rest without activity is usually not good for your back.  ?? To prevent future back pain, do exercises to stretch and strengthen your back and stomach. Learn how to use good posture, safe lifting techniques, and proper body mechanics.  When should you call for help?  Call your doctor now or seek immediate medical care if:  ?? ?? You have new or worsening numbness in your legs.   ?? ?? You have new or worsening weakness in your legs. (This could make it hard to stand up.)   ?? ?? You lose control of   your bladder or bowels.   ??Watch closely for changes in your health, and be sure to contact your doctor if:  ?? ?? You have a fever, lose weight, or don't feel well.   ?? ?? You do not get better as expected.   Where can you learn more?  Go to http://www.healthwise.net/[facility].  Enter I594 in the search box to learn more about "Back Pain: Care Instructions."  Current as of: August 26, 2017  Content Version: 11.9  ?? 2006-2018 [employer]. Care instructions adapted under license by [employer] (which disclaims liability or warranty for this information). If you have questions about a medical condition or this instruction, always ask your healthcare professional. Healthwise, Incorporated disclaims any warranty or liability for your use of this information.

## 2018-04-29 NOTE — Telephone Encounter (Signed)
Pt is here and is requesting an updated note for her job she needs the note to be very specific b/c her condition is not absolute. She has moments where she cannot walk or stand.   She needs her note to say "PT cannot walk long or short distances depending on how her arthritis is flaring up. PT can not stand long period times depending on how her arthritis is flaring up. Pt cannot lift heavy objects. [name] arthritis flares up in hot, muggy, cold and rainy weather, so pt cannot be out in this weather long without her arthritis flaring up.   Please Advise.

## 2018-04-29 NOTE — Telephone Encounter (Signed)
Tried to call patient at [phone] recording states this number is to a patient room not accepting numbers, a restricted phone number or an unassigned number at [facility]. Attempted home number and recording states no one is available to answer the phone and disconnects.

## 2018-04-29 NOTE — Telephone Encounter (Signed)
Burke and she states this patient is not longer with [facility].

## 2018-05-09 ENCOUNTER — Ambulatory Visit
Admit: 2018-05-09 | Discharge: 2018-05-09 | Payer: PRIVATE HEALTH INSURANCE | Attending: Specialist | Primary: Internal Medicine

## 2018-05-09 ENCOUNTER — Ambulatory Visit: Attending: Specialist | Primary: Internal Medicine

## 2018-05-09 DIAGNOSIS — I5032 Chronic diastolic (congestive) heart failure: Secondary | ICD-10-CM

## 2018-05-09 MED ORDER — POTASSIUM CHLORIDE SR 10 MEQ TAB, PARTICLES/CRYSTALS
10 mEq | ORAL_TABLET | Freq: Every day | ORAL | 3 refills | Status: DC
Start: 2018-05-09 — End: 2019-06-15

## 2018-05-09 NOTE — Progress Notes (Signed)
Patient didn't bring medications, verbally reviewed      1. Have you been to the ER, urgent care clinic since your last visit?  Hospitalized since your last visit?     no    2. Have you seen or consulted any other health care providers outside of the [facility] since your last visit?  Include any pap smears or colon screening.      No     3.  Since your last visit, have you had any of the following symptoms?      no  4.  Have you had any blood work, X-rays or cardiac testing?      Yes When: 4/19 Where:  Reason for visit: routine     Requested: YES     In [facility]: YES    5.  Where do you normally have your labs drawn?   PCP    6. Do you need any refills today?   no

## 2018-05-09 NOTE — Progress Notes (Signed)
HISTORY OF PRESENT ILLNESS  Elaine [name] is a 67 y.o. female.    05/2018 - Mild SOB today related to asthma - improved with inhaler use.    Hypertension   The history is provided by the medical records. This is a chronic problem. Associated symptoms include shortness of breath.   Cholesterol Problem   The history is provided by the medical records. This is a chronic problem. Associated symptoms include shortness of breath.   Shortness of Breath   The history is provided by the patient. This is a new problem. The problem occurs intermittently.The current episode started more than 1 week ago (9/17). The problem has not changed since onset.Pertinent negatives include no fever, no cough, no wheezing, no PND, no orthopnea, no vomiting, no rash, no leg swelling and no claudication. The problem's precipitants include exercise (steps).       Review of Systems   Constitutional: Negative for chills, fever, malaise/fatigue and weight loss.   HENT: Negative for nosebleeds.    Eyes: Negative for discharge.   Respiratory: Positive for shortness of breath. Negative for cough and wheezing.    Cardiovascular: Negative for palpitations, orthopnea, claudication, leg swelling and PND.   Gastrointestinal: Negative for diarrhea, nausea and vomiting.   Genitourinary: Negative for dysuria and hematuria.   Musculoskeletal: Negative for joint pain.   Skin: Negative for rash.   Neurological: Negative for dizziness, seizures and loss of consciousness.   Endo/Heme/Allergies: Negative for polydipsia. Does not bruise/bleed easily.   Psychiatric/Behavioral: Negative for depression and substance abuse. The patient does not have insomnia.      Allergies   Allergen Reactions   ??? Asa-Acetaminophen-Caff-Potass Other (comments)     Bleeding in stomach   ??? Benadryl [Diphenhydramine Hcl] Other (comments)     Muscle jerking   ??? Other Plant, Animal, Environmental Not Reported This Time     Grass, Dust and Mold   ??? Pollen Extracts Not Reported This Time        Past Medical History:   Diagnosis Date   ??? Anemia    ??? Asthma    ??? Colon polyp    ??? DDD (degenerative disc disease) 02/15/2009   ??? Depression 12/18/2011   ??? Essential hypertension, benign    ??? History of echocardiogram 03/20/2004    EF 70%.  Borderline DDfx.  No significant valvular pathology.   ??? Hypercholesterolemia    ??? Hypertension    ??? Iron deficiency anemia 1/32/4401   ??? 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 Lower extremity venous duplex 10/23/2009    Left leg:  No DVT.     ??? Meralgia paraesthetica 10/22/2013   ??? OA (osteoarthritis of spine) 02/15/2009   ??? OA (osteoarthritis of spine) 02/15/2009   ??? Obesity, unspecified    ??? Other and unspecified hyperlipidemia    ??? Pre-operative cardiovascular examination     For spine surgery   ??? Right sided sciatica    ??? Shortness of breath     Possible asthma, HCVD; less likely CAD (Noted 03/15/09)   ??? Sleep apnea 02/15/2009    uses cpap machine   ??? Thallium stress test  03/20/2004    Partially transient, mod basal & mid anterior defect most c/w artifact; mild anterior ischemia less likely.  Neg EKG on pharm stress test.   ??? Thyroid disease     hypothyroidism       Family History   Problem Relation Age of Onset   ??? Cancer Mother    ???  Hypertension Mother    ??? Hypertension Sister         x3   ??? Heart Surgery Sister    ??? Heart Disease Sister    ??? Heart Attack Sister    ??? Cancer Maternal Aunt         breast   ??? Breast Cancer Maternal Aunt    ??? Glaucoma Son    ??? Heart defect Son    ??? Diabetes Paternal Uncle    ??? Hypertension Paternal Uncle        Social History     Tobacco Use   ??? Smoking status: Former Smoker     Years: 28.00     Last attempt to quit: 04/25/2004     Years since quitting: 14.0   ??? Smokeless tobacco: Never Used   Substance Use Topics   ??? Alcohol use: No   ??? Drug use: No        Current Outpatient Medications   Medication Sig   ??? beclomethasone dipropionate (QVAR REDIHALER) 80 mcg/actuation HFAb inhaler Take 1 Puff by inhalation.   ??? atorvastatin (LIPITOR) 40 mg tablet take 1  tablet by mouth once daily   ??? montelukast (SINGULAIR) 10 mg tablet TAKE 1 TABLET BY MOUTH AT BEDTIME IF NEEDED   ??? levothyroxine (SYNTHROID) 150 mcg tablet TAKE 1 TABLET BY MOUTH ONCE DAILY BEFORE BREAKFAST   ??? valsartan-hydroCHLOROthiazide (DIOVAN-HCT) 160-25 mg per tablet take 1 tablet by mouth once daily   ??? gabapentin (NEURONTIN) 300 mg capsule take 1 capsule by mouth every morning AND 2 CAPSULES AT BEDTIME   ??? omeprazole (PRILOSEC) 40 mg capsule take 1 capsule by mouth once daily BEFORE BREAKFAST   ??? latanoprost (XALATAN) 0.005 % ophthalmic solution    ??? multivitamin (ONE A DAY) tablet Take 1 Tab by mouth daily.   ??? Cholecalciferol, Vitamin D3, (VITAMIN D-3) 5,000 unit Tab Take  by mouth daily.     No current facility-administered medications for this visit.         Past Surgical History:   Procedure Laterality Date   ??? HX COLONOSCOPY  02-24-12    normal (Dr. Angelique Holm)   ??? HX HERNIA REPAIR      3x   ??? HX HYSTERECTOMY     ??? HX TUBAL LIGATION     ??? NEUROLOGICAL PROCEDURE UNLISTED  03-16-2009    s/p ACD & Fusion (Dr.P.Gurtner)   ??? PR COLONOSCOPY FLX DX W/COLLJ SPEC WHEN PFRMD  02-2007    +polyp(tubular adenoma); Dr Angelique Holm       Visit Vitals  BP 100/48   Pulse 88   Ht 5\' 2"  (1.575 m)   Wt 84.6 kg (186 lb 9.6 oz)   BMI 34.13 kg/m??       Diagnostic Studies:  I have reviewed the relevant tests done on the patient and show as follows  EKG tracings reviewed by me today.    No flowsheet data found.  1/18 Nuc Stress  Diagnosis:   1. Probably normal scan.    2. Evidence of a small mild fixed inferior wall defect noted from his nuclear study suggestive of tissue attenuation with normal wall motion in the area.    3. No reversible defect suggesting ischemia noted from his nuclear study.   4. Normal left ventricular size and systolic function.   5. Low risk scan.     1/18 ECHO  SUMMARY:  Left ventricle: Systolic function was hyperdynamic. Ejection fraction was  estimated in the range of  70 % to 75 %. There were no regional wall  motion  abnormalities. There was mild concentric hypertrophy. Doppler parameters  were consistent with abnormal left ventricular relaxation (grade 1  diastolic dysfunction).    Mitral valve: There was mild annular calcification.    Ms. Engert has a reminder for a "due or due soon" health maintenance. I have asked that she contact her primary care provider for follow-up on this health maintenance.    Physical Exam   Constitutional: She is oriented to person, place, and time. She appears well-developed and well-nourished. No distress.   sev obese   HENT:   Head: Normocephalic and atraumatic.   Mouth/Throat: Normal dentition.   Eyes: Right eye exhibits no discharge. Left eye exhibits no discharge. No scleral icterus.   Neck: Neck supple. No JVD present. Carotid bruit is not present. No thyromegaly present.   Cardiovascular: Normal rate, regular rhythm, S1 normal, S2 normal, normal heart sounds and intact distal pulses. Exam reveals no gallop and no friction rub.   No murmur heard.  Pulmonary/Chest: Effort normal and breath sounds normal. She has no wheezes. She has no rales.   Abdominal: Soft. She exhibits no mass. There is no tenderness.   Musculoskeletal: She exhibits no edema.   Lymphadenopathy:        Right cervical: No superficial cervical adenopathy present.       Left cervical: No superficial cervical adenopathy present.   Neurological: She is alert and oriented to person, place, and time.   Skin: Skin is warm and dry. No rash noted.   Psychiatric: She has a normal mood and affect. Her behavior is normal.     Results for BENAY, POMEROY (MRN [id number]) as of 05/09/2018 15:04   Ref. Range 03/07/2018 17:10   Triglyceride Latest Ref Range: <150 MG/DL 115   Cholesterol, total Latest Ref Range: <200 MG/DL 161   HDL Cholesterol Latest Ref Range: 40 - 60 MG/DL 56   CHOL/HDL Ratio Latest Ref Range: 0 - 5.0   2.9   LDL, calculated Latest Ref Range: 0 - 100 MG/DL 82   VLDL, calculated Latest Units: MG/DL 23     ASSESSMENT  and PLAN    Stable CHF.  B/P is unusually low today - she will monitor at home and will adjust medications accordingly. Recent labs reviewed - continue current medications.     Diagnoses and all orders for this visit:    1. Chronic diastolic congestive heart failure ([place])    2. SOB (shortness of breath) on exertion    3. Essential hypertension, malignant    4. Pure hypercholesterolemia    5. Obesity (BMI 30-39.9)        Pertinent laboratory and test data reviewed and discussed with patient.  See patient instructions also for other medical advice given    Medications Discontinued During This Encounter   Medication Reason   ??? gabapentin (NEURONTIN) 295 mg capsule Duplicate Order   ??? PROAIR HFA 90 mcg/actuation inhaler    ??? VARICELLA-ZOSTER VACINE LIVE 19,400 unit/0.65 mL susr injection    ??? budesonide-formoterol (SYMBICORT) 160-4.5 mcg/actuation HFA inhaler          I have independently evaluated and examined the patient.  CHF is clinically stable.  Blood pressure is low normal but no symptoms of dizziness.  Advised the patient to call us if dizzy or we will check home BP records also.  All relevant labs and testing data's are reviewed.  Care plan discussed and updated  after review.  Carlyle Dolly, MD

## 2018-05-09 NOTE — Patient Instructions (Addendum)
After the recommended changes have been made in blood pressure medicines, patient advised to keep BP/HR(pulse rate) chart twice daily and bring [place] results in next 2 weeks or so. Patient may send the results via "My Chart" if desired.  Please rest for 5-10 minutes before checking blood pressure    Acute High Blood Pressure: Care Instructions  Your Care Instructions    Acute high blood pressure is very high blood pressure. It's a serious problem. Very high blood pressure can damage your brain, heart, eyes, and kidneys.  You may have been given medicines to lower your blood pressure. You may have gotten them as pills or through a needle in one of your veins. This is called an IV. And maybe you were given other medicines too. These can be needed when high blood pressure causes other problems.  To keep your blood pressure at a lower level, you may need to make healthy lifestyle changes. And you will probably need to take medicines.  Be sure to follow up with your doctor about your blood pressure and what you can do about it.  Follow-up care is a key part of your treatment and safety. Be sure to make and go to all appointments, and call your doctor if you are having problems. It's also a good idea to know your test results and keep a list of the medicines you take.  How can you care for yourself at home?  ?? See your doctor as often as he or she recommends. This is to make sure your blood pressure is under control. You will probably go at least 2 times a year. But it may be more often at first.  ?? Take your blood pressure medicine exactly as prescribed. You may take one or more types. They include diuretics, beta-blockers, ACE inhibitors, calcium channel blockers, and angiotensin II receptor blockers. Call your doctor if you think you are having a problem with your medicine.  ?? If you take blood pressure medicine, talk to your doctor before you take  decongestants or anti-inflammatory medicine, such as ibuprofen. These can raise blood pressure.  ?? Learn how to check your blood pressure at home. Check it often.  ?? Ask your doctor if it's okay to drink alcohol.  ?? Talk to your doctor about lifestyle changes that can help blood pressure. These include being active and not smoking.  When should you call for help?  Call 911 anytime you think you may need emergency care. This may mean having symptoms that suggest that your blood pressure is causing a serious heart or blood vessel problem. Your blood pressure may be over 180/120.  ??For example, call 911 if:  ?? ?? You have symptoms of a heart attack. These may include:  ? Chest pain or pressure, or a strange feeling in the chest.  ? Sweating.  ? Shortness of breath.  ? Nausea or vomiting.  ? Pain, pressure, or a strange feeling in the back, neck, jaw, or upper belly or in one or both shoulders or arms.  ? Lightheadedness or sudden weakness.  ? A fast or irregular heartbeat.   ?? ?? You have symptoms of a stroke. These may include:  ? Sudden numbness, tingling, weakness, or loss of movement in your face, arm, or leg, especially on only one side of your body.  ? Sudden vision changes.  ? Sudden trouble speaking.  ? Sudden confusion or trouble understanding simple statements.  ? Sudden problems with walking or balance.  ?  A sudden, severe headache that is different from past headaches.   ?? ?? You have severe back or belly pain.   ??Do not wait until your blood pressure comes down on its own. Get help right away.  ??Call your doctor now or seek immediate care if:  ?? ?? Your blood pressure is much higher than normal (such as 180/120 or higher), but you don't have symptoms.   ?? ?? You think high blood pressure is causing symptoms, such as:  ? Severe headache.  ? Blurry vision.   ??Watch closely for changes in your health, and be sure to contact your doctor if:  ?? ?? Your blood pressure measures higher than your doctor recommends at  least 2 times. That means the top number is higher or the bottom number is higher, or both.   ?? ?? You think you may be having side effects from your blood pressure medicine.   Where can you learn more?  Go to [email or web address].  Enter [phone] in the search box to learn more about "Acute High Blood Pressure: Care Instructions."  Current as of: June 27, 2017  Content Version: 11.9  ?? 2006-2018 [employer]. Care instructions adapted under license by [employer] (which disclaims liability or warranty for this information). If you have questions about a medical condition or this instruction, always ask your healthcare professional. [place] any warranty or liability for your use of this information.    After the recommended changes have been made in blood pressure medicines, patient advised to keep BP/HR(pulse rate) chart twice daily and bring [place] results in next 2 weeks or so. Patient may send the results via "My Chart" if desired.  Please rest for 5-10 minutes before checking blood pressure

## 2018-05-09 NOTE — Progress Notes (Signed)
Patient didn't bring medications, verbally reviewed      1. Have you been to the ER, urgent care clinic since your last visit?  Hospitalized since your last visit?     no    2. Have you seen or consulted any other health care providers outside of the [facility] since your last visit?  Include any pap smears or colon screening.      No     3.  Since your last visit, have you had any of the following symptoms?      no  4.  Have you had any blood work, X-rays or cardiac testing?      Yes When: 4/19 Where:  Reason for visit: routine     Requested: YES     In [facility]: YES    5.  Where do you normally have your labs drawn?   PCP    6. Do you need any refills today?   no

## 2018-05-09 NOTE — Progress Notes (Signed)
HISTORY OF PRESENT ILLNESS  Elaine [name] is a 67 y.o. female.    05/2018 - Mild SOB today related to asthma - improved with inhaler use.    Hypertension   The history is provided by the medical records. This is a chronic problem. Associated symptoms include shortness of breath.   Cholesterol Problem   The history is provided by the medical records. This is a chronic problem. Associated symptoms include shortness of breath.   Shortness of Breath   The history is provided by the patient. This is a new problem. The problem occurs intermittently.The current episode started more than 1 week ago (9/17). The problem has not changed since onset.Pertinent negatives include no fever, no cough, no wheezing, no PND, no orthopnea, no vomiting, no rash, no leg swelling and no claudication. The problem's precipitants include exercise (steps).       Review of Systems   Constitutional: Negative for chills, fever, malaise/fatigue and weight loss.   HENT: Negative for nosebleeds.    Eyes: Negative for discharge.   Respiratory: Positive for shortness of breath. Negative for cough and wheezing.    Cardiovascular: Negative for palpitations, orthopnea, claudication, leg swelling and PND.   Gastrointestinal: Negative for diarrhea, nausea and vomiting.   Genitourinary: Negative for dysuria and hematuria.   Musculoskeletal: Negative for joint pain.   Skin: Negative for rash.   Neurological: Negative for dizziness, seizures and loss of consciousness.   Endo/Heme/Allergies: Negative for polydipsia. Does not bruise/bleed easily.   Psychiatric/Behavioral: Negative for depression and substance abuse. The patient does not have insomnia.      Allergies   Allergen Reactions   ??? Asa-Acetaminophen-Caff-Potass Other (comments)     Bleeding in stomach   ??? Benadryl [Diphenhydramine Hcl] Other (comments)     Muscle jerking   ??? Other Plant, Animal, Environmental Not Reported This Time     Grass, Dust and Mold   ??? Pollen Extracts Not Reported This Time        Past Medical History:   Diagnosis Date   ??? Anemia    ??? Asthma    ??? Colon polyp    ??? DDD (degenerative disc disease) 02/15/2009   ??? Depression 12/18/2011   ??? Essential hypertension, benign    ??? History of echocardiogram 03/20/2004    EF 70%.  Borderline DDfx.  No significant valvular pathology.   ??? Hypercholesterolemia    ??? Hypertension    ??? Iron deficiency anemia 9/48/5462   ??? Lichen planus 06/08/5008   ??? Lower extremity venous duplex 10/23/2009    Left leg:  No DVT.     ??? Meralgia paraesthetica 10/22/2013   ??? OA (osteoarthritis of spine) 02/15/2009   ??? OA (osteoarthritis of spine) 02/15/2009   ??? Obesity, unspecified    ??? Other and unspecified hyperlipidemia    ??? Pre-operative cardiovascular examination     For spine surgery   ??? Right sided sciatica    ??? Shortness of breath     Possible asthma, HCVD; less likely CAD (Noted 03/15/09)   ??? Sleep apnea 02/15/2009    uses cpap machine   ??? Thallium stress test  03/20/2004    Partially transient, mod basal & mid anterior defect most c/w artifact; mild anterior ischemia less likely.  Neg EKG on pharm stress test.   ??? Thyroid disease     hypothyroidism       Family History   Problem Relation Age of Onset   ??? Cancer Mother    ???  Hypertension Mother    ??? Hypertension Sister         x3   ??? Heart Surgery Sister    ??? Heart Disease Sister    ??? Heart Attack Sister    ??? Cancer Maternal Aunt         breast   ??? Breast Cancer Maternal Aunt    ??? Glaucoma Son    ??? Heart defect Son    ??? Diabetes Paternal Uncle    ??? Hypertension Paternal Uncle        Social History     Tobacco Use   ??? Smoking status: Former Smoker     Years: 28.00     Last attempt to quit: 04/25/2004     Years since quitting: 14.0   ??? Smokeless tobacco: Never Used   Substance Use Topics   ??? Alcohol use: No   ??? Drug use: No        Current Outpatient Medications   Medication Sig   ??? beclomethasone dipropionate (QVAR REDIHALER) 80 mcg/actuation HFAb inhaler Take 1 Puff by inhalation.    ??? atorvastatin (LIPITOR) 40 mg tablet take 1 tablet by mouth once daily   ??? montelukast (SINGULAIR) 10 mg tablet TAKE 1 TABLET BY MOUTH AT BEDTIME IF NEEDED   ??? levothyroxine (SYNTHROID) 150 mcg tablet TAKE 1 TABLET BY MOUTH ONCE DAILY BEFORE BREAKFAST   ??? valsartan-hydroCHLOROthiazide (DIOVAN-HCT) 160-25 mg per tablet take 1 tablet by mouth once daily   ??? gabapentin (NEURONTIN) 300 mg capsule take 1 capsule by mouth every morning AND 2 CAPSULES AT BEDTIME   ??? omeprazole (PRILOSEC) 40 mg capsule take 1 capsule by mouth once daily BEFORE BREAKFAST   ??? latanoprost (XALATAN) 0.005 % ophthalmic solution    ??? multivitamin (ONE A DAY) tablet Take 1 Tab by mouth daily.   ??? Cholecalciferol, Vitamin D3, (VITAMIN D-3) 5,000 unit Tab Take  by mouth daily.     No current facility-administered medications for this visit.         Past Surgical History:   Procedure Laterality Date   ??? HX COLONOSCOPY  02-24-12    normal (Dr. Angelique Holm)   ??? HX HERNIA REPAIR      3x   ??? HX HYSTERECTOMY     ??? HX TUBAL LIGATION     ??? NEUROLOGICAL PROCEDURE UNLISTED  03-16-2009    s/p ACD & Fusion (Dr.P.Gurtner)   ??? PR COLONOSCOPY FLX DX W/COLLJ SPEC WHEN PFRMD  02-2007    +polyp(tubular adenoma); Dr Angelique Holm       Visit Vitals  BP 100/48   Pulse 88   Ht 5\' 2"  (1.575 m)   Wt 84.6 kg (186 lb 9.6 oz)   BMI 34.13 kg/m??       Diagnostic Studies:  I have reviewed the relevant tests done on the patient and show as follows  EKG tracings reviewed by me today.    No flowsheet data found.  1/18 Nuc Stress  Diagnosis:   1. Probably normal scan.    2. Evidence of a small mild fixed inferior wall defect noted from his nuclear study suggestive of tissue attenuation with normal wall motion in the area.    3. No reversible defect suggesting ischemia noted from his nuclear study.   4. Normal left ventricular size and systolic function.   5. Low risk scan.     1/18 ECHO  SUMMARY:  Left ventricle: Systolic function was hyperdynamic. Ejection fraction was   estimated in the range  of 70 % to 75 %. There were no regional wall motion  abnormalities. There was mild concentric hypertrophy. Doppler parameters  were consistent with abnormal left ventricular relaxation (grade 1  diastolic dysfunction).    Mitral valve: There was mild annular calcification.    Ms. Pesnell has a reminder for a "due or due soon" health maintenance. I have asked that she contact her primary care provider for follow-up on this health maintenance.    Physical Exam   Constitutional: She is oriented to person, place, and time. She appears well-developed and well-nourished. No distress.   sev obese   HENT:   Head: Normocephalic and atraumatic.   Mouth/Throat: Normal dentition.   Eyes: Right eye exhibits no discharge. Left eye exhibits no discharge. No scleral icterus.   Neck: Neck supple. No JVD present. Carotid bruit is not present. No thyromegaly present.   Cardiovascular: Normal rate, regular rhythm, S1 normal, S2 normal, normal heart sounds and intact distal pulses. Exam reveals no gallop and no friction rub.   No murmur heard.  Pulmonary/Chest: Effort normal and breath sounds normal. She has no wheezes. She has no rales.   Abdominal: Soft. She exhibits no mass. There is no tenderness.   Musculoskeletal: She exhibits no edema.   Lymphadenopathy:        Right cervical: No superficial cervical adenopathy present.       Left cervical: No superficial cervical adenopathy present.   Neurological: She is alert and oriented to person, place, and time.   Skin: Skin is warm and dry. No rash noted.   Psychiatric: She has a normal mood and affect. Her behavior is normal.     Results for CAMRYNN, MCCLINTIC (MRN [id number]) as of 05/09/2018 15:04   Ref. Range 03/07/2018 17:10   Triglyceride Latest Ref Range: <150 MG/DL 115   Cholesterol, total Latest Ref Range: <200 MG/DL 161   HDL Cholesterol Latest Ref Range: 40 - 60 MG/DL 56   CHOL/HDL Ratio Latest Ref Range: 0 - 5.0   2.9    LDL, calculated Latest Ref Range: 0 - 100 MG/DL 82   VLDL, calculated Latest Units: MG/DL 23     ASSESSMENT and PLAN    Stable CHF.  B/P is unusually low today - she will monitor at home and will adjust medications accordingly. Recent labs reviewed - continue current medications.     Diagnoses and all orders for this visit:    1. Chronic diastolic congestive heart failure ([place])    2. SOB (shortness of breath) on exertion    3. Essential hypertension, malignant    4. Pure hypercholesterolemia    5. Obesity (BMI 30-39.9)        Pertinent laboratory and test data reviewed and discussed with patient.  See patient instructions also for other medical advice given    Medications Discontinued During This Encounter   Medication Reason   ??? gabapentin (NEURONTIN) 782 mg capsule Duplicate Order   ??? PROAIR HFA 90 mcg/actuation inhaler    ??? VARICELLA-ZOSTER VACINE LIVE 19,400 unit/0.65 mL susr injection    ??? budesonide-formoterol (SYMBICORT) 160-4.5 mcg/actuation HFA inhaler          I have independently evaluated and examined the patient.  CHF is clinically stable.  Blood pressure is low normal but no symptoms of dizziness.  Advised the patient to call us if dizzy or we will check home BP records also.  All relevant labs and testing data's are reviewed.  Care plan discussed and updated  after review.  Carlyle Dolly, MD

## 2018-06-27 ENCOUNTER — Encounter: Attending: Neurology | Primary: Internal Medicine

## 2018-06-27 MED ORDER — MONTELUKAST 10 MG TAB
10 mg | ORAL_TABLET | ORAL | 1 refills | Status: DC
Start: 2018-06-27 — End: 2019-01-23

## 2018-06-27 NOTE — Telephone Encounter (Signed)
LOV  04/28/2018  NOV  09/06/2018

## 2018-07-02 ENCOUNTER — Encounter

## 2018-07-04 MED ORDER — VALSARTAN-HYDROCHLOROTHIAZIDE 160 MG-25 MG TAB
160-25 mg | ORAL_TABLET | ORAL | 3 refills | Status: DC
Start: 2018-07-04 — End: 2018-12-23

## 2018-07-29 ENCOUNTER — Encounter

## 2018-08-16 ENCOUNTER — Encounter

## 2018-08-16 ENCOUNTER — Inpatient Hospital Stay: Admit: 2018-08-16 | Payer: PRIVATE HEALTH INSURANCE | Attending: Internal Medicine | Primary: Internal Medicine

## 2018-08-16 DIAGNOSIS — Z1231 Encounter for screening mammogram for malignant neoplasm of breast: Secondary | ICD-10-CM

## 2018-08-18 ENCOUNTER — Ambulatory Visit: Payer: PRIVATE HEALTH INSURANCE | Primary: Internal Medicine

## 2018-08-30 ENCOUNTER — Encounter: Primary: Internal Medicine

## 2018-09-05 ENCOUNTER — Encounter: Attending: Neurology | Primary: Internal Medicine

## 2018-09-05 NOTE — Telephone Encounter (Signed)
Appeal denial rec'd and placed in folder at [facility]

## 2018-09-06 ENCOUNTER — Encounter: Attending: Internal Medicine | Primary: Internal Medicine

## 2018-09-06 NOTE — Telephone Encounter (Signed)
Placed in provider's folder for review.

## 2018-09-11 ENCOUNTER — Encounter

## 2018-09-12 MED ORDER — GABAPENTIN 300 MG CAP
300 mg | ORAL_CAPSULE | ORAL | 1 refills | Status: DC
Start: 2018-09-12 — End: 2019-02-28

## 2018-09-21 ENCOUNTER — Encounter: Attending: Neurology | Primary: Internal Medicine

## 2018-09-26 ENCOUNTER — Encounter: Attending: Neurology | Primary: Internal Medicine

## 2018-10-06 ENCOUNTER — Telehealth

## 2018-10-06 NOTE — Telephone Encounter (Signed)
Please order labs if appropriate    Pt is going to [facility] for lab collection this Saturday 10/08/2018    She has a f/u appt with you scheduled for 10/14/2018

## 2018-10-07 NOTE — Telephone Encounter (Signed)
Orders done

## 2018-10-08 ENCOUNTER — Encounter: Primary: Internal Medicine

## 2018-10-10 ENCOUNTER — Encounter: Attending: Neurology | Primary: Internal Medicine

## 2018-10-10 DIAGNOSIS — E7800 Pure hypercholesterolemia, unspecified: Secondary | ICD-10-CM

## 2018-10-11 ENCOUNTER — Inpatient Hospital Stay: Admit: 2018-10-11 | Payer: PRIVATE HEALTH INSURANCE | Primary: Internal Medicine

## 2018-10-11 LAB — COMPREHENSIVE METABOLIC PANEL
ALT: 25 U/L (ref 13–56)
AST: 14 U/L (ref 10–38)
Albumin/Globulin Ratio: 1.3 (ref 0.8–1.7)
Albumin: 3.9 g/dL (ref 3.4–5.0)
Alkaline Phosphatase: 88 U/L (ref 45–117)
Anion Gap: 3 mmol/L (ref 3.0–18)
BUN/Creatinine Ratio: 12 (ref 12–20)
BUN: 10 MG/DL (ref 7.0–18)
CO2: 35 mmol/L — ABNORMAL HIGH (ref 21–32)
Calcium: 9.5 MG/DL (ref 8.5–10.1)
Chloride: 98 mmol/L — ABNORMAL LOW (ref 100–111)
Creatinine: 0.83 MG/DL (ref 0.6–1.3)
GFR African American: >60 mL/min/{1.73_m2} (ref >60)
Globulin: 3.1 g/dL (ref 2.0–4.0)
Glucose: 125 mg/dL — ABNORMAL HIGH (ref 74–99)
Potassium: 3.5 mmol/L (ref 3.5–5.5)
Sodium: 136 mmol/L (ref 136–145)
Total Bilirubin: 0.6 MG/DL (ref 0.2–1.0)
Total Protein: 7 g/dL (ref 6.4–8.2)
eGFR NON-AA: >60 mL/min/{1.73_m2} (ref >60)

## 2018-10-11 LAB — HEMOGLOBIN A1C W/O EAG
Hemoglobin A1C: 5.7 % — ABNORMAL HIGH (ref 4.2–5.6)
Hemoglobin A1c: 5.7 % — ABNORMAL HIGH (ref 4.2–5.6)

## 2018-10-11 LAB — LIPID PANEL
CHOL/HDL Ratio: 3.2 (ref 0–5.0)
Chol/HDL Ratio: 3.2 (ref 0–5.0)
Cholesterol, Total: 197 MG/DL (ref <200)
Cholesterol, total: 197 MG/DL (ref <200)
HDL Cholesterol: 62 MG/DL — ABNORMAL HIGH (ref 40–60)
HDL: 62 MG/DL — ABNORMAL HIGH (ref 40–60)
LDL Calculated: 117 MG/DL — ABNORMAL HIGH (ref 0–100)
LDL, calculated: 117 MG/DL — ABNORMAL HIGH (ref 0–100)
Triglyceride: 90 MG/DL (ref <150)
Triglycerides: 90 MG/DL (ref <150)
VLDL Cholesterol Calculated: 18 MG/DL
VLDL, calculated: 18 MG/DL

## 2018-10-11 LAB — VITAMIN D 25 HYDROXY: Vit D, 25-Hydroxy: 45.1 ng/mL (ref 30–100)

## 2018-10-11 LAB — TSH 3RD GENERATION
TSH: 30.8 u[IU]/mL — ABNORMAL HIGH (ref 0.36–3.74)
TSH: 30.8 u[IU]/mL — ABNORMAL HIGH (ref 0.36–3.74)

## 2018-10-11 LAB — METABOLIC PANEL, COMPREHENSIVE
A-G Ratio: 1.3 (ref 0.8–1.7)
ALT (SGPT): 25 U/L (ref 13–56)
AST (SGOT): 14 U/L (ref 10–38)
Albumin: 3.9 g/dL (ref 3.4–5.0)
Alk. phosphatase: 88 U/L (ref 45–117)
Anion gap: 3 mmol/L (ref 3.0–18)
BUN/Creatinine ratio: 12 (ref 12–20)
BUN: 10 MG/DL (ref 7.0–18)
Bilirubin, total: 0.6 MG/DL (ref 0.2–1.0)
CO2: 35 mmol/L — ABNORMAL HIGH (ref 21–32)
Calcium: 9.5 MG/DL (ref 8.5–10.1)
Chloride: 98 mmol/L — ABNORMAL LOW (ref 100–111)
Creatinine: 0.83 MG/DL (ref 0.6–1.3)
GFR est AA: >60 mL/min/{1.73_m2} (ref >60)
GFR est non-AA: >60 mL/min/{1.73_m2} (ref >60)
Globulin: 3.1 g/dL (ref 2.0–4.0)
Glucose: 125 mg/dL — ABNORMAL HIGH (ref 74–99)
Potassium: 3.5 mmol/L (ref 3.5–5.5)
Protein, total: 7 g/dL (ref 6.4–8.2)
Sodium: 136 mmol/L (ref 136–145)

## 2018-10-11 LAB — VITAMIN D, 25 HYDROXY: Vitamin D 25-Hydroxy: 45.1 ng/mL (ref 30–100)

## 2018-10-14 ENCOUNTER — Ambulatory Visit
Admit: 2018-10-14 | Discharge: 2018-10-14 | Payer: PRIVATE HEALTH INSURANCE | Attending: Internal Medicine | Primary: Internal Medicine

## 2018-10-14 ENCOUNTER — Ambulatory Visit: Attending: Internal Medicine | Primary: Internal Medicine

## 2018-10-14 DIAGNOSIS — I10 Essential (primary) hypertension: Secondary | ICD-10-CM

## 2018-10-14 MED ORDER — TERBINAFINE 250 MG TAB
250 mg | ORAL_TABLET | Freq: Every day | ORAL | 0 refills | Status: DC
Start: 2018-10-14 — End: 2019-02-28

## 2018-10-14 MED ORDER — PREDNISONE 10 MG TABLETS IN A DOSE PACK
10 mg | ORAL_TABLET | ORAL | 0 refills | Status: DC
Start: 2018-10-14 — End: 2018-10-28

## 2018-10-14 NOTE — Progress Notes (Signed)
Patient is in the office today for 6 month follow up.    1. Have you been to the ER, urgent care clinic since your last visit?  Hospitalized since your last visit?No    2. Have you seen or consulted any other health care providers outside of the [facility] since your last visit?  Include any pap smears or colon screening. No

## 2018-10-14 NOTE — Progress Notes (Signed)
Elaine [name] is a 67 y.o.  female and presents with Hypertension; Cholesterol Problem; Arm Pain; Thyroid Problem; Immunization/Injection (Flu vaccine and Pneumococcal); and Vitamin D Deficiency      SUBJECTIVE:  [name] HTN is well controlled on DiovanHct 160/25   [name] high cholesterol is borderline controlled on Lipitor 40 mg. Pt  denies any side effects like myalgias. Compliance with medications has been an issue.  Pt is controlling her prediabetes with diet and weight loss.      Thyroid Review:  Patient is seen for followup of hypothyroidism.   Thyroid ROS: denies fatigue, weight changes, heat/cold intolerance, bowel/skin changes or CVS symptoms. 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 Pt admits she misses doses of synthroid this is why her TSH is on the high side.     [name] vit D level is well controlled on vit D 2000 units/day.     Pt c/o bilateral shoulder pain which started about 3 weeks ago and is slowly improving.     Pt c/o thick toenails due to fungal infection and wanted to use Lamisil.    Pt has a h/o sleep apnea and needs to f/u with sleep specialist for further management.     Respiratory ROS: negative for - shortness of breath  Cardiovascular ROS: negative for - chest pain    Current Outpatient Medications   Medication Sig   ??? predniSONE (STERAPRED DS) 10 mg dose pack See administration instruction per 52m dose pack   ??? terbinafine HCl (LAMISIL) 250 mg tablet Take 1 Tab by mouth daily.   ??? gabapentin (NEURONTIN) 300 mg capsule take 1 capsule by mouth every morning and 2 capsules by mouth at bedtime   ??? valsartan-hydroCHLOROthiazide (DIOVAN-HCT) 160-25 mg per tablet take 1 tablet by mouth once daily   ??? montelukast (SINGULAIR) 10 mg tablet TAKE 1 TABLET BY MOUTH AT BEDTIME IF NEEDED   ??? beclomethasone dipropionate (QVAR REDIHALER) 80 mcg/actuation HFAb inhaler Take 1 Puff by inhalation.   ??? potassium  chloride (KLOR-CON) 10 mEq tablet Take 1 Tab by mouth daily.   ??? atorvastatin (LIPITOR) 40 mg tablet take 1 tablet by mouth once daily   ??? levothyroxine (SYNTHROID) 150 mcg tablet TAKE 1 TABLET BY MOUTH ONCE DAILY BEFORE BREAKFAST   ??? omeprazole (PRILOSEC) 40 mg capsule take 1 capsule by mouth once daily BEFORE BREAKFAST   ??? latanoprost (XALATAN) 0.005 % ophthalmic solution    ??? multivitamin (ONE A DAY) tablet Take 1 Tab by mouth daily.   ??? Cholecalciferol, Vitamin D3, (VITAMIN D-3) 5,000 unit Tab Take  by mouth daily.     No current facility-administered medications for this visit.          OBJECTIVE:  alert, well appearing, and in no distress  Visit Vitals  BP 121/68 (BP 1 Location: Left arm, BP Patient Position: Sitting)   Pulse 86   Temp 98.7 ??F (37.1 ??C) (Oral)   Resp 20   Ht '5\' 2"'  (1.575 m)   Wt 198 lb (89.8 kg)   SpO2 95%   BMI 36.21 kg/m??      well developed and well nourished  Chest - clear to auscultation, no wheezes, rales or rhonchi, symmetric air entry  Heart - normal rate, regular rhythm, normal S1, S2, no murmurs, rubs, clicks or gallops  Extremities - peripheral pulses normal, no pedal edema, no clubbing or cyanosis    Labs:   Lab Results  Component Value Date/Time    Cholesterol, total 197 10/10/2018 03:55 PM    HDL Cholesterol 62 (H) 10/10/2018 03:55 PM    LDL, calculated 117 (H) 10/10/2018 03:55 PM    Triglyceride 90 10/10/2018 03:55 PM    CHOL/HDL Ratio 3.2 10/10/2018 03:55 PM     Lab Results   Component Value Date/Time    Sodium 136 10/10/2018 03:55 PM    Potassium 3.5 10/10/2018 03:55 PM    Chloride 98 (L) 10/10/2018 03:55 PM    CO2 35 (H) 10/10/2018 03:55 PM    Anion gap 3 10/10/2018 03:55 PM    Glucose 125 (H) 10/10/2018 03:55 PM    BUN 10 10/10/2018 03:55 PM    Creatinine 0.83 10/10/2018 03:55 PM    BUN/Creatinine ratio 12 10/10/2018 03:55 PM    GFR est AA >60 10/10/2018 03:55 PM    GFR est non-AA >60 10/10/2018 03:55 PM    Calcium 9.5 10/10/2018 03:55 PM    Bilirubin, total 0.6 10/10/2018  03:55 PM    ALT (SGPT) 25 10/10/2018 03:55 PM    AST (SGOT) 14 10/10/2018 03:55 PM    Alk. phosphatase 88 10/10/2018 03:55 PM    Protein, total 7.0 10/10/2018 03:55 PM    Albumin 3.9 10/10/2018 03:55 PM    Globulin 3.1 10/10/2018 03:55 PM    A-G Ratio 1.3 10/10/2018 03:55 PM      Lab Results   Component Value Date/Time    Hemoglobin A1c 5.7 (H) 10/10/2018 03:55 PM      Labs:   Lab Results   Component Value Date/Time    TSH 30.80 (H) 10/10/2018 03:55 PM    T4, Free 1.3 03/29/2017 05:30 PM          Discussed the patient's BMI with her.  The BMI follow up plan is as follows: I have counseled this patient on diet and exercise regimens.        Assessment/Plan      ICD-10-CM ICD-9-CM    1. HTN (hypertension), benign I10 401.1  well-controlled on current medication METABOLIC PANEL, COMPREHENSIVE   2. Hypercholesteremia E78.00 272.0  not optimally controlled on Lipitor probably due to compliance.  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 Vitamin D deficiency E55.9 268.9  well-controlled on vitamin D 2000 units/day vITAMIN D, 25 HYDROXY   5. Pre-diabetes R73.03 790.29  improving diet and weight loss hEMOGLOBIN A1C W/O EAG   6. Acute pain of both shoulders M25.511 719.41  will try predniSONE (STERAPRED DS) 10 mg dose pack not improving will consider referral to orthopedics and check ESR    M25.512     7. Fungal infection of toenail B35.1 110.1  treated with terbinafine HCl (LAMISIL) 250 mg tablet   8. Obstructive sleep apnea syndrome G47.33 327.23 REFERRAL TO PULMONARY DISEASE for further management   9. Encounter for immunization Z23 V03.89 INFLUENZA VACCINE INACTIVATED (IIV), SUBUNIT, ADJUVANTED, IM      PNEUMOCOCCAL POLYSACCHARIDE VACCINE, 23-VALENT, ADULT OR IMMUNOSUPPRESSED PT DOSE,       Follow-up and Dispositions    ?? Return in about 3 months (around 01/14/2019) for OV, and Medicare Wellness Visit, labs 1 week before.            Reviewed plan of care. Patient has provided input and agrees with goals.

## 2018-10-14 NOTE — ACP (Advance Care Planning) (Signed)
Patient has ACP information

## 2018-10-14 NOTE — Progress Notes (Signed)
Elaine [name] is a 67 y.o.  female and presents with Hypertension; Cholesterol Problem; Arm Pain; Thyroid Problem; Immunization/Injection (Flu vaccine and Pneumococcal); and Vitamin D Deficiency      SUBJECTIVE:  [name] HTN is well controlled on DiovanHct 160/25   [name] high cholesterol is borderline controlled on Lipitor 40 mg. Pt  denies any side effects like myalgias. Compliance with medications has been an issue.  Pt is controlling her prediabetes with diet and weight loss.      Thyroid Review:  Patient is seen for followup of hypothyroidism.   Thyroid ROS: denies fatigue, weight changes, heat/cold intolerance, bowel/skin changes or CVS symptoms. 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 Pt admits she misses doses of synthroid this is why her TSH is on the high side.     [name] vit D level is well controlled on vit D 2000 units/day.     Pt c/o bilateral shoulder pain which started about 3 weeks ago and is slowly improving.     Pt c/o thick toenails due to fungal infection and wanted to use Lamisil.    Pt has a h/o sleep apnea and needs to f/u with sleep specialist for further management.     Respiratory ROS: negative for - shortness of breath  Cardiovascular ROS: negative for - chest pain    Current Outpatient Medications   Medication Sig   ??? predniSONE (STERAPRED DS) 10 mg dose pack See administration instruction per '10mg'$  dose pack   ??? terbinafine HCl (LAMISIL) 250 mg tablet Take 1 Tab by mouth daily.   ??? gabapentin (NEURONTIN) 300 mg capsule take 1 capsule by mouth every morning and 2 capsules by mouth at bedtime   ??? valsartan-hydroCHLOROthiazide (DIOVAN-HCT) 160-25 mg per tablet take 1 tablet by mouth once daily   ??? montelukast (SINGULAIR) 10 mg tablet TAKE 1 TABLET BY MOUTH AT BEDTIME IF NEEDED   ??? beclomethasone dipropionate (QVAR REDIHALER) 80 mcg/actuation HFAb inhaler Take 1 Puff by inhalation.    ??? potassium chloride (KLOR-CON) 10 mEq tablet Take 1 Tab by mouth daily.   ??? atorvastatin (LIPITOR) 40 mg tablet take 1 tablet by mouth once daily   ??? levothyroxine (SYNTHROID) 150 mcg tablet TAKE 1 TABLET BY MOUTH ONCE DAILY BEFORE BREAKFAST   ??? omeprazole (PRILOSEC) 40 mg capsule take 1 capsule by mouth once daily BEFORE BREAKFAST   ??? latanoprost (XALATAN) 0.005 % ophthalmic solution    ??? multivitamin (ONE A DAY) tablet Take 1 Tab by mouth daily.   ??? Cholecalciferol, Vitamin D3, (VITAMIN D-3) 5,000 unit Tab Take  by mouth daily.     No current facility-administered medications for this visit.          OBJECTIVE:  alert, well appearing, and in no distress  Visit Vitals  BP 121/68 (BP 1 Location: Left arm, BP Patient Position: Sitting)   Pulse 86   Temp 98.7 ??F (37.1 ??C) (Oral)   Resp 20   Ht '5\' 2"'$  (1.575 m)   Wt 198 lb (89.8 kg)   SpO2 95%   BMI 36.21 kg/m??      well developed and well nourished  Chest - clear to auscultation, no wheezes, rales or rhonchi, symmetric air entry  Heart - normal rate, regular rhythm, normal S1, S2, no murmurs, rubs, clicks or gallops  Extremities - peripheral pulses normal, no pedal edema, no clubbing or cyanosis    Labs:   Lab Results  Component Value Date/Time    Cholesterol, total 197 10/10/2018 03:55 PM    HDL Cholesterol 62 (H) 10/10/2018 03:55 PM    LDL, calculated 117 (H) 10/10/2018 03:55 PM    Triglyceride 90 10/10/2018 03:55 PM    CHOL/HDL Ratio 3.2 10/10/2018 03:55 PM     Lab Results   Component Value Date/Time    Sodium 136 10/10/2018 03:55 PM    Potassium 3.5 10/10/2018 03:55 PM    Chloride 98 (L) 10/10/2018 03:55 PM    CO2 35 (H) 10/10/2018 03:55 PM    Anion gap 3 10/10/2018 03:55 PM    Glucose 125 (H) 10/10/2018 03:55 PM    BUN 10 10/10/2018 03:55 PM    Creatinine 0.83 10/10/2018 03:55 PM    BUN/Creatinine ratio 12 10/10/2018 03:55 PM    GFR est AA >60 10/10/2018 03:55 PM    GFR est non-AA >60 10/10/2018 03:55 PM    Calcium 9.5 10/10/2018 03:55 PM     Bilirubin, total 0.6 10/10/2018 03:55 PM    ALT (SGPT) 25 10/10/2018 03:55 PM    AST (SGOT) 14 10/10/2018 03:55 PM    Alk. phosphatase 88 10/10/2018 03:55 PM    Protein, total 7.0 10/10/2018 03:55 PM    Albumin 3.9 10/10/2018 03:55 PM    Globulin 3.1 10/10/2018 03:55 PM    A-G Ratio 1.3 10/10/2018 03:55 PM      Lab Results   Component Value Date/Time    Hemoglobin A1c 5.7 (H) 10/10/2018 03:55 PM      Labs:   Lab Results   Component Value Date/Time    TSH 30.80 (H) 10/10/2018 03:55 PM    T4, Free 1.3 03/29/2017 05:30 PM          Discussed the patient's BMI with her.  The BMI follow up plan is as follows: I have counseled this patient on diet and exercise regimens.        Assessment/Plan      ICD-10-CM ICD-9-CM    1. HTN (hypertension), benign I10 401.1  well-controlled on current medication METABOLIC PANEL, COMPREHENSIVE   2. Hypercholesteremia E78.00 272.0  not optimally controlled on Lipitor probably due to compliance.  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 Vitamin D deficiency E55.9 268.9  well-controlled on vitamin D 2000 units/day vITAMIN D, 25 HYDROXY   5. Pre-diabetes R73.03 790.29  improving diet and weight loss hEMOGLOBIN A1C W/O EAG   6. Acute pain of both shoulders M25.511 719.41  will try predniSONE (STERAPRED DS) 10 mg dose pack not improving will consider referral to orthopedics and check ESR    M25.512     7. Fungal infection of toenail B35.1 110.1  treated with terbinafine HCl (LAMISIL) 250 mg tablet   8. Obstructive sleep apnea syndrome G47.33 327.23 REFERRAL TO PULMONARY DISEASE for further management   9. Encounter for immunization Z23 V03.89 INFLUENZA VACCINE INACTIVATED (IIV), SUBUNIT, ADJUVANTED, IM      PNEUMOCOCCAL POLYSACCHARIDE VACCINE, 23-VALENT, ADULT OR IMMUNOSUPPRESSED PT DOSE,       Follow-up and Dispositions     ?? Return in about 3 months (around 01/14/2019) for OV, and Medicare Wellness Visit, labs 1 week before.           Reviewed plan of care. Patient has provided input and agrees with goals.

## 2018-10-14 NOTE — Progress Notes (Signed)
Patient is in the office today for 6 month follow up.    1. Have you been to the ER, urgent care clinic since your last visit?  Hospitalized since your last visit?No    2. Have you seen or consulted any other health care providers outside of the [facility] since your last visit?  Include any pap smears or colon screening. No

## 2018-10-14 NOTE — Patient Instructions (Signed)
Hyperlipidemia: After Your Visit  Your Care Instructions  Hyperlipidemia is too much fat in your blood. The body has several kinds of fat, including cholesterol and triglycerides. Your body needs fat for many things, such as making new cells. But too much fat in your blood increases your chances of having a heart attack or stroke.  You may be able to lower your cholesterol and triglycerides with a heart-healthy diet, exercise, and if needed, medicine. Your doctor may want you to try lifestyle changes first to see whether they lower the fat in your blood. You may need to take medicine if lifestyle changes do not lower the fat in your blood enough.  Follow-up care is a key part of your treatment and safety. Be sure to make and go to all appointments, and call your doctor if you are having problems. It???s also a good idea to know your test results and keep a list of the medicines you take.  How can you care for yourself at home?  Take your medicines  ?? Take your medicines exactly as prescribed. Call your doctor if you think you are having a problem with your medicine.  ?? If you take medicine to lower your cholesterol, go to follow-up visits. You will need to have blood tests.  ?? Do not take large doses of niacin, which is a B vitamin, while taking medicine called statins. It may increase the chance of muscle pain and liver problems.  ?? Talk to your doctor about avoiding grapefruit juice if you are taking statins. Grapefruit juice can raise the level of this medicine in your blood. This could increase side effects.  Eat more fruits, vegetables, and fiber  ?? Fruits and vegetables have lots of nutrients that help protect against heart disease, and they have little???if any???fat. Try to eat at least five servings a day. Dark green, deep orange, or yellow fruits and vegetables are healthy choices.  ?? Keep carrots, celery, and other veggies handy for snacks. Buy fruit that  is in season and store it where you can see it so that you will be tempted to eat it. Cook dishes that have a lot of veggies in them, such as stir-fries and soups.  ?? Foods high in fiber may reduce your cholesterol and provide important vitamins and minerals. High-fiber foods include whole-grain cereals and breads, oatmeal, beans, brown rice, citrus fruits, and apples.  ?? Buy whole-grain breads and cereals instead of white bread and pastries.  Limit saturated fat  ?? Read food labels and try to avoid saturated fat and trans fat. They increase your risk of heart disease.  ?? Use olive or canola oil when you cook. Try cholesterol-lowering spreads, such as Benecol or Take Control.  ?? Bake, broil, grill, or steam foods instead of frying them.  ?? Limit the amount of high-fat meats you eat, including hot dogs and sausages. Cut out all visible fat when you prepare meat.  ?? Eat fish, skinless poultry, and soy products such as tofu instead of high-fat meats. Soybeans may be especially good for your heart. Eat at least two servings of fish a week. Certain fish, such as salmon, contain omega-3 fatty acids, which may help reduce your risk of heart attack.  ?? Choose low-fat or fat-free milk and dairy products.  Get exercise, limit alcohol, and quit smoking  ?? Get more exercise. Work with your doctor to set up an exercise program. Even if you can do only a small amount, exercise will help   you get stronger, have more energy, and manage your weight and your stress. Walking is an easy way to get exercise. Gradually increase the amount you walk every day. Aim for at least 30 minutes on most days of the week. You also may want to swim, bike, or do other activities.  ?? Limit alcohol to no more than 2 drinks a day for men and 1 drink a day for women.  ?? Do not smoke. If you need help quitting, talk to your doctor about stop-smoking programs and medicines. These can increase your chances of quitting for good.   When should you call for help?  Call 911 anytime you think you may need emergency care. For example, call if:  ?? You have symptoms of a heart attack. These may include:  ?? Chest pain or pressure, or a strange feeling in the chest.  ?? Sweating.  ?? Shortness of breath.  ?? Nausea or vomiting.  ?? Pain, pressure, or a strange feeling in the back, neck, jaw, or upper belly or in one or both shoulders or arms.  ?? Lightheadedness or sudden weakness.  ?? A fast or irregular heartbeat.  After you call 911, the operator may tell you to chew 1 adult-strength or 2 to 4 low-dose aspirin. Wait for an ambulance. Do not try to drive yourself.  ?? You have signs of a stroke. These may include:  ?? Sudden numbness, paralysis, or weakness in your face, arm, or leg, especially on only one side of your body.  ?? New problems with walking or balance.  ?? Sudden vision changes.  ?? Drooling or slurred speech.  ?? New problems speaking or understanding simple statements, or feeling confused.  ?? A sudden, severe headache that is different from past headaches.  ?? You passed out (lost consciousness).  Call your doctor now or seek immediate medical care if:  ?? You have muscle pain or weakness.  Watch closely for changes in your health, and be sure to contact your doctor if:  ?? You are very tired.  ?? You have an upset stomach, gas, constipation, or belly pain or cramps.   Where can you learn more?   Go to http://www.healthwise.net/[facility]  Enter C406 in the search box to learn more about "Hyperlipidemia: After Your Visit."   ?? 2006-2013 Healthwise, Incorporated. Care instructions adapted under license by [facility] (which disclaims liability or warranty for this information). This care instruction is for use with your licensed healthcare professional. If you have questions about a medical condition or this instruction, always ask your healthcare professional. Healthwise, Incorporated disclaims any warranty or liability for your use of this  information.  Content Version: 9.9.209917; Last Revised: September 18, 2010

## 2018-10-28 ENCOUNTER — Ambulatory Visit
Admit: 2018-10-28 | Discharge: 2018-10-28 | Payer: PRIVATE HEALTH INSURANCE | Attending: Internal Medicine | Primary: Internal Medicine

## 2018-10-28 ENCOUNTER — Encounter: Attending: Internal Medicine | Primary: Internal Medicine

## 2018-10-28 ENCOUNTER — Ambulatory Visit
Admit: 2018-10-28 | Discharge: 2018-10-28 | Payer: PRIVATE HEALTH INSURANCE | Attending: Neurology | Primary: Internal Medicine

## 2018-10-28 ENCOUNTER — Ambulatory Visit: Attending: Internal Medicine | Primary: Internal Medicine

## 2018-10-28 ENCOUNTER — Ambulatory Visit: Attending: Neurology | Primary: Internal Medicine

## 2018-10-28 DIAGNOSIS — R197 Diarrhea, unspecified: Secondary | ICD-10-CM

## 2018-10-28 DIAGNOSIS — R413 Other amnesia: Secondary | ICD-10-CM

## 2018-10-28 LAB — AMB POC RAPID INFLUENZA TEST
QuickVue Influenza test: NEGATIVE
QuickVue Influenza test: NEGATIVE

## 2018-10-28 MED ORDER — DIPHENOXYLATE-ATROPINE 2.5 MG-0.025 MG TAB
ORAL_TABLET | Freq: Four times a day (QID) | ORAL | 0 refills | Status: DC | PRN
Start: 2018-10-28 — End: 2018-12-23

## 2018-10-28 NOTE — Progress Notes (Signed)
Patient is in the office today for diarrhea since Monday and body aches.    1. Have you been to the ER, urgent care clinic since your last visit?  Hospitalized since your last visit?No    2. Have you seen or consulted any other health care providers outside of the [facility] since your last visit?  Include any pap smears or colon screening. No

## 2018-10-28 NOTE — Progress Notes (Signed)
Re:  Elaine [name],Follow up visit     10/28/2018 4:24 PM    SSN: [id number]    Subjective:   Elaine [name] returns for follow up.  She's never tried the Topamax for fear of side effects.  She gets headaches associated with her asthma attacks, about weekly associated with coughing.      Her complaint is difficulty with memory.  This has been noted for the last 2 years, she suggests progressively worse.      Medications:    Current Outpatient Medications   Medication Sig Dispense Refill   ??? diphenoxylate-atropine (LOMOTIL) 2.5-0.025 mg per tablet Take 1 Tab by mouth four (4) times daily as needed for Diarrhea. Max Daily Amount: 4 Tabs. 30 Tab 0   ??? terbinafine HCl (LAMISIL) 250 mg tablet Take 1 Tab by mouth daily. 90 Tab 0   ??? gabapentin (NEURONTIN) 300 mg capsule take 1 capsule by mouth every morning and 2 capsules by mouth at bedtime 180 Cap 1   ??? valsartan-hydroCHLOROthiazide (DIOVAN-HCT) 160-25 mg per tablet take 1 tablet by mouth once daily 90 Tab 3   ??? montelukast (SINGULAIR) 10 mg tablet TAKE 1 TABLET BY MOUTH AT BEDTIME IF NEEDED 90 Tab 1   ??? beclomethasone dipropionate (QVAR REDIHALER) 80 mcg/actuation HFAb inhaler Take 1 Puff by inhalation.     ??? potassium chloride (KLOR-CON) 10 mEq tablet Take 1 Tab by mouth daily. 90 Tab 3   ??? atorvastatin (LIPITOR) 40 mg tablet take 1 tablet by mouth once daily 30 Tab 5   ??? levothyroxine (SYNTHROID) 150 mcg tablet TAKE 1 TABLET BY MOUTH ONCE DAILY BEFORE BREAKFAST 90 Tab 1   ??? latanoprost (XALATAN) 0.005 % ophthalmic solution      ??? multivitamin (ONE A DAY) tablet Take 1 Tab by mouth daily.     ??? Cholecalciferol, Vitamin D3, (VITAMIN D-3) 5,000 unit Tab Take  by mouth daily.     ??? omeprazole (PRILOSEC) 40 mg capsule take 1 capsule by mouth once daily BEFORE BREAKFAST 30 Cap 3       Vital signs:    Visit Vitals  BP 102/70 (BP 1 Location: Left arm, BP Patient Position: Sitting)   Pulse (!) 101   Temp 99.8 ??F (37.7 ??C) (Oral)   Resp 20   Ht '5\' 2"'   (1.575 m)   Wt 90.5 kg (199 lb 9.6 oz)   SpO2 100%   BMI 36.51 kg/m??       Review of Systems:   As above otherwise 11 point review of systems negative including;   Constitutional no fever or chills  Skin denies rash or itching  HEENT  Denies tinnitus, hearing lose  Eyes denies diplopia vision lose  Respiratory denies sortness of breath  Cardiovascular denies chest pain, dyspnea on exertion  Gastrointestinal denies nausea, vomiting, diarrhea, constipation  Genitourinary denies incontinence  Musculoskeletal denies joint pain or swelling  Endocrine denies weight change  Hematology denies easy bruising or bleeding   Neurological as above in HPI      Patient Active Problem List   Diagnosis Code   ??? DOE (dyspnea on exertion) R06.09   ??? Hypothyroidism E03.9   ??? Hypercholesteremia E78.00   ??? HTN (hypertension), benign I10   ??? GERD (gastroesophageal reflux disease) K21.9   ??? Asthma J45.909   ??? Vitamin D deficiency E55.9   ??? Iron deficiency anemia D50.9   ??? Submental lymphadenopathy R59.0   ??? Sciatica M54.30   ??? Depression F32.9   ???  Meralgia paraesthetica G57.10   ??? Obesity (BMI 30-39.9) E66.9   ??? Pre-diabetes R73.03   ??? Abnormal stress test R94.39   ??? Need for hepatitis C screening test Z11.59   ??? Glaucoma screening Z13.5   ??? Left knee pain M25.562   ??? Swelling of left lower extremity M79.89   ??? Osteoarthritis of left knee M17.12   ??? Post-menopausal Z78.0   ??? 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 Dehydration E86.0   ??? Migraine without aura and without status migrainosus, not intractable G43.009   ??? Claustrophobia F40.240   ??? Chronic midline low back pain without sciatica M54.5, G89.29   ??? Rash R21   ??? Encounter for long-term (current) use of medications Z79.899   ??? URI (upper respiratory infection) J06.9   ??? Cough R05   ??? Routine general medical examination at a health care facility Z00.00   ??? Need for pneumococcal vaccination Z23   ??? Hospital discharge follow-up Z09   ??? Chest pain R07.9   ??? Chronic diastolic congestive  heart failure (HCC) I50.32   ??? Severe obesity (BMI 35.0-39.9) with comorbidity ([facility]) E66.01   ??? Sore throat J02.9   ??? Memory problem R41.3         Objective: The patient is awake, alert, and oriented x 4.  Fund of knowledge is adequate.  Speech is fluent and memory is intact.    Cranial Nerves: II ??? Visual fields are full to confrontation.  III, IV, VI ??? Extraocular movements are intact. There is no nystagmus. V ??? Facial sensation is intact to pinprick.  VII ??? Face is symmetrical.  VIII - Hearing is present.  IX, X, XII ??? Palate is symmetrical.   XI - Shoulder shrugging and head turning intact  Motor:  The patient moves all four limbs fairly well and symmetrically. Tone is normal. Reflexes are 2+ and symmetrical. Plantars are down going. Gait is normal.    CBC:   Lab Results   Component Value Date/Time    WBC 6.2 01/10/2018 04:34 PM    RBC 4.07 (L) 01/10/2018 04:34 PM    HGB 10.3 (L) 01/10/2018 04:34 PM    HCT 32.5 (L) 01/10/2018 04:34 PM    PLATELET 318 01/10/2018 04:34 PM     BMP:   Lab Results   Component Value Date/Time    Glucose 125 (H) 10/10/2018 03:55 PM    Sodium 136 10/10/2018 03:55 PM    Potassium 3.5 10/10/2018 03:55 PM    Chloride 98 (L) 10/10/2018 03:55 PM    CO2 35 (H) 10/10/2018 03:55 PM    BUN 10 10/10/2018 03:55 PM    Creatinine 0.83 10/10/2018 03:55 PM    Calcium 9.5 10/10/2018 03:55 PM     CMP:   Lab Results   Component Value Date/Time    Glucose 125 (H) 10/10/2018 03:55 PM    Sodium 136 10/10/2018 03:55 PM    Potassium 3.5 10/10/2018 03:55 PM    Chloride 98 (L) 10/10/2018 03:55 PM    CO2 35 (H) 10/10/2018 03:55 PM    BUN 10 10/10/2018 03:55 PM    Creatinine 0.83 10/10/2018 03:55 PM    Calcium 9.5 10/10/2018 03:55 PM    Anion gap 3 10/10/2018 03:55 PM    BUN/Creatinine ratio 12 10/10/2018 03:55 PM    Alk. phosphatase 88 10/10/2018 03:55 PM    Protein, total 7.0 10/10/2018 03:55 PM    Albumin 3.9 10/10/2018 03:55 PM    Globulin 3.1 10/10/2018 03:55  PM    A-G Ratio 1.3 10/10/2018 03:55 PM      Coagulation:   Lab Results   Component Value Date/Time    INR, External 1.0 10/09/2014       Assessment:  Concern for memory trouble, but no evidence of this on testing.  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  Migraines, declining therapy for concern of side effects.    Plan:  Will get neuropsychology testing.  No therapy at this time.  RTC 3 months.    Sincerely,        Legrand Como A. Gretchen Portela, M.D.

## 2018-10-28 NOTE — Progress Notes (Signed)
Elaine [name] is a 67 y.o. female in today for follow-up on memory loss.    Learning assessment previously completed 03/07/2018; primary language is [place].    1. Have you been to the ER, urgent care clinic since your last visit?  Hospitalized since your last visit?No    2. Have you seen or consulted any other health care providers outside of the [facility] since your last visit?  Include any pap smears or colon screening. No

## 2018-10-28 NOTE — Progress Notes (Signed)
Elaine [name] is a 67 y.o.  female and presents with Diarrhea and Generalized Body Aches      SUBJECTIVE:    Diarrhea  Patient complains of diarrhea. Onset of diarrhea was 5 days ago. Diarrhea is occurring approximately 4 times per day. Patient describes diarrhea as watery. Diarrhea has been associated with myalgias and fatigue .  Patient denies significant abdominal pain, blood in stool, recent antibiotic use, recent travel, unintentional weight loss.  Previous visits for diarrhea: none. Evaluation to date: none. Treatment to date: imodium       Respiratory ROS: negative for - shortness of breath  Cardiovascular ROS: negative for - chest pain    Current Outpatient Medications   Medication Sig   ??? diphenoxylate-atropine (LOMOTIL) 2.5-0.025 mg per tablet Take 1 Tab by mouth four (4) times daily as needed for Diarrhea. Max Daily Amount: 4 Tabs.   ??? terbinafine HCl (LAMISIL) 250 mg tablet Take 1 Tab by mouth daily.   ??? gabapentin (NEURONTIN) 300 mg capsule take 1 capsule by mouth every morning and 2 capsules by mouth at bedtime   ??? valsartan-hydroCHLOROthiazide (DIOVAN-HCT) 160-25 mg per tablet take 1 tablet by mouth once daily   ??? montelukast (SINGULAIR) 10 mg tablet TAKE 1 TABLET BY MOUTH AT BEDTIME IF NEEDED   ??? beclomethasone dipropionate (QVAR REDIHALER) 80 mcg/actuation HFAb inhaler Take 1 Puff by inhalation.   ??? potassium chloride (KLOR-CON) 10 mEq tablet Take 1 Tab by mouth daily.   ??? atorvastatin (LIPITOR) 40 mg tablet take 1 tablet by mouth once daily   ??? levothyroxine (SYNTHROID) 150 mcg tablet TAKE 1 TABLET BY MOUTH ONCE DAILY BEFORE BREAKFAST   ??? latanoprost (XALATAN) 0.005 % ophthalmic solution    ??? multivitamin (ONE A DAY) tablet Take 1 Tab by mouth daily.   ??? Cholecalciferol, Vitamin D3, (VITAMIN D-3) 5,000 unit Tab Take  by mouth daily.   ??? omeprazole (PRILOSEC) 40 mg capsule take 1 capsule by mouth once daily BEFORE BREAKFAST     No current facility-administered medications for this visit.           OBJECTIVE:  alert, well appearing, and in no distress  Visit Vitals  BP 129/73 (BP 1 Location: Left arm, BP Patient Position: Sitting)   Pulse (!) 102   Temp 99.2 ??F (37.3 ??C) (Oral)   Resp 20   Ht 5\' 2"  (1.575 m)   Wt 194 lb 3.2 oz (88.1 kg)   SpO2 98%   BMI 35.52 kg/m??      well developed and well nourished  Chest - clear to auscultation, no wheezes, rales or rhonchi, symmetric air entry  Heart - normal rate, regular rhythm, normal S1, S2, no murmurs, rubs, clicks or gallops  Abdomen - soft, nontender, nondistended, no masses or organomegaly        Assessment/Plan      ICD-10-CM ICD-9-CM    1. Diarrhea, unspecified type R19.7 787.91 AMB POC RAPID INFLUENZA TEST negative       Try diphenoxylate-atropine (LOMOTIL) 2.5-0.025 mg per tablet   2. Generalized body aches R52 780.96 AMB POC RAPID INFLUENZA TEST. Can use Tylenol prn      Follow-up and Dispositions    ?? Return if symptoms worsen or fail to improve.           Reviewed plan of care. Patient has provided input and agrees with goals.

## 2018-10-28 NOTE — Progress Notes (Signed)
Patient is in the office today for diarrhea since Monday and body aches.    1. Have you been to the ER, urgent care clinic since your last visit?  Hospitalized since your last visit?No    2. Have you seen or consulted any other health care providers outside of the [facility] since your last visit?  Include any pap smears or colon screening. No

## 2018-10-28 NOTE — Progress Notes (Signed)
Re:  Bre A Kinkade,Follow up visit     10/28/2018 4:24 PM    SSN: [id number]    Subjective:   [name] returns for follow up.  She's never tried the Topamax for fear of side effects.  She gets headaches associated with her asthma attacks, about weekly associated with coughing.      Her complaint is difficulty with memory.  This has been noted for the last 2 years, she suggests progressively worse.      Medications:    Current Outpatient Medications   Medication Sig Dispense Refill   ??? diphenoxylate-atropine (LOMOTIL) 2.5-0.025 mg per tablet Take 1 Tab by mouth four (4) times daily as needed for Diarrhea. Max Daily Amount: 4 Tabs. 30 Tab 0   ??? terbinafine HCl (LAMISIL) 250 mg tablet Take 1 Tab by mouth daily. 90 Tab 0   ??? gabapentin (NEURONTIN) 300 mg capsule take 1 capsule by mouth every morning and 2 capsules by mouth at bedtime 180 Cap 1   ??? valsartan-hydroCHLOROthiazide (DIOVAN-HCT) 160-25 mg per tablet take 1 tablet by mouth once daily 90 Tab 3   ??? montelukast (SINGULAIR) 10 mg tablet TAKE 1 TABLET BY MOUTH AT BEDTIME IF NEEDED 90 Tab 1   ??? beclomethasone dipropionate (QVAR REDIHALER) 80 mcg/actuation HFAb inhaler Take 1 Puff by inhalation.     ??? potassium chloride (KLOR-CON) 10 mEq tablet Take 1 Tab by mouth daily. 90 Tab 3   ??? atorvastatin (LIPITOR) 40 mg tablet take 1 tablet by mouth once daily 30 Tab 5   ??? levothyroxine (SYNTHROID) 150 mcg tablet TAKE 1 TABLET BY MOUTH ONCE DAILY BEFORE BREAKFAST 90 Tab 1   ??? latanoprost (XALATAN) 0.005 % ophthalmic solution      ??? multivitamin (ONE A DAY) tablet Take 1 Tab by mouth daily.     ??? Cholecalciferol, Vitamin D3, (VITAMIN D-3) 5,000 unit Tab Take  by mouth daily.     ??? omeprazole (PRILOSEC) 40 mg capsule take 1 capsule by mouth once daily BEFORE BREAKFAST 30 Cap 3       Vital signs:    Visit Vitals  BP 102/70 (BP 1 Location: Left arm, BP Patient Position: Sitting)   Pulse (!) 101   Temp 99.8 ??F (37.7 ??C) (Oral)   Resp 20    Ht '5\' 2"'$  (1.575 m)   Wt 90.5 kg (199 lb 9.6 oz)   SpO2 100%   BMI 36.51 kg/m??       Review of Systems:   As above otherwise 11 point review of systems negative including;   Constitutional no fever or chills  Skin denies rash or itching  HEENT  Denies tinnitus, hearing lose  Eyes denies diplopia vision lose  Respiratory denies sortness of breath  Cardiovascular denies chest pain, dyspnea on exertion  Gastrointestinal denies nausea, vomiting, diarrhea, constipation  Genitourinary denies incontinence  Musculoskeletal denies joint pain or swelling  Endocrine denies weight change  Hematology denies easy bruising or bleeding   Neurological as above in HPI      Patient Active Problem List   Diagnosis Code   ??? DOE (dyspnea on exertion) R06.09   ??? Hypothyroidism E03.9   ??? Hypercholesteremia E78.00   ??? HTN (hypertension), benign I10   ??? GERD (gastroesophageal reflux disease) K21.9   ??? Asthma J45.909   ??? Vitamin D deficiency E55.9   ??? Iron deficiency anemia D50.9   ??? Submental lymphadenopathy R59.0   ??? Sciatica M54.30   ??? Depression F32.9   ???  Meralgia paraesthetica G57.10   ??? Obesity (BMI 30-39.9) E66.9   ??? Pre-diabetes R73.03   ??? Abnormal stress test R94.39   ??? Need for hepatitis C screening test Z11.59   ??? Glaucoma screening Z13.5   ??? Left knee pain M25.562   ??? Swelling of left lower extremity M79.89   ??? Osteoarthritis of left knee M17.12   ??? Post-menopausal Z78.0   ??? 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 Dehydration E86.0   ??? Migraine without aura and without status migrainosus, not intractable G43.009   ??? Claustrophobia F40.240   ??? Chronic midline low back pain without sciatica M54.5, G89.29   ??? Rash R21   ??? Encounter for long-term (current) use of medications Z79.899   ??? URI (upper respiratory infection) J06.9   ??? Cough R05   ??? Routine general medical examination at a health care facility Z00.00   ??? Need for pneumococcal vaccination Z23   ??? Hospital discharge follow-up Z09   ??? Chest pain R07.9    ??? Chronic diastolic congestive heart failure (HCC) I50.32   ??? Severe obesity (BMI 35.0-39.9) with comorbidity ([place]) E66.01   ??? Sore throat J02.9   ??? Memory problem R41.3         Objective: The patient is awake, alert, and oriented x 4.  Fund of knowledge is adequate.  Speech is fluent and memory is intact.    Cranial Nerves: II ??? Visual fields are full to confrontation.  III, IV, VI ??? Extraocular movements are intact. There is no nystagmus. V ??? Facial sensation is intact to pinprick.  VII ??? Face is symmetrical.  VIII - Hearing is present.  IX, X, XII ??? Palate is symmetrical.   XI - Shoulder shrugging and head turning intact  Motor:  The patient moves all four limbs fairly well and symmetrically. Tone is normal. Reflexes are 2+ and symmetrical. Plantars are down going. Gait is normal.    CBC:   Lab Results   Component Value Date/Time    WBC 6.2 01/10/2018 04:34 PM    RBC 4.07 (L) 01/10/2018 04:34 PM    HGB 10.3 (L) 01/10/2018 04:34 PM    HCT 32.5 (L) 01/10/2018 04:34 PM    PLATELET 318 01/10/2018 04:34 PM     BMP:   Lab Results   Component Value Date/Time    Glucose 125 (H) 10/10/2018 03:55 PM    Sodium 136 10/10/2018 03:55 PM    Potassium 3.5 10/10/2018 03:55 PM    Chloride 98 (L) 10/10/2018 03:55 PM    CO2 35 (H) 10/10/2018 03:55 PM    BUN 10 10/10/2018 03:55 PM    Creatinine 0.83 10/10/2018 03:55 PM    Calcium 9.5 10/10/2018 03:55 PM     CMP:   Lab Results   Component Value Date/Time    Glucose 125 (H) 10/10/2018 03:55 PM    Sodium 136 10/10/2018 03:55 PM    Potassium 3.5 10/10/2018 03:55 PM    Chloride 98 (L) 10/10/2018 03:55 PM    CO2 35 (H) 10/10/2018 03:55 PM    BUN 10 10/10/2018 03:55 PM    Creatinine 0.83 10/10/2018 03:55 PM    Calcium 9.5 10/10/2018 03:55 PM    Anion gap 3 10/10/2018 03:55 PM    BUN/Creatinine ratio 12 10/10/2018 03:55 PM    Alk. phosphatase 88 10/10/2018 03:55 PM    Protein, total 7.0 10/10/2018 03:55 PM    Albumin 3.9 10/10/2018 03:55 PM    Globulin 3.1 10/10/2018 03:55  PM     A-G Ratio 1.3 10/10/2018 03:55 PM     Coagulation:   Lab Results   Component Value Date/Time    INR, External 1.0 10/09/2014       Assessment:  Concern for memory trouble, but no evidence of this on testing.  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  Migraines, declining therapy for concern of side effects.    Plan:  Will get neuropsychology testing.  No therapy at this time.  RTC 3 months.    Sincerely,        Legrand Como A. Gretchen Portela, M.D.

## 2018-10-28 NOTE — Patient Instructions (Signed)
A Healthy Lifestyle: Care Instructions  Your Care Instructions    A healthy lifestyle can help you feel good, stay at a healthy weight, and have plenty of energy for both work and play. A healthy lifestyle is something you can share with your whole family.  A healthy lifestyle also can lower your risk for serious health problems, such as high blood pressure, heart disease, and diabetes.  You can follow a few steps listed below to improve your health and the health of your family.  Follow-up care is a key part of your treatment and safety. Be sure to make and go to all appointments, and call your doctor if you are having problems. It's also a good idea to know your test results and keep a list of the medicines you take.  How can you care for yourself at home?  ?? Do not eat too much sugar, fat, or fast foods. You can still have dessert and treats now and then. The goal is moderation.  ?? Start small to improve your eating habits. Pay attention to portion sizes, drink less juice and soda pop, and eat more fruits and vegetables.  ? Eat a healthy amount of food. A 3-ounce serving of meat, for example, is about the size of a deck of cards. Fill the rest of your plate with vegetables and whole grains.  ? Limit the amount of soda and sports drinks you have every day. Drink more water when you are thirsty.  ? Eat at least 5 servings of fruits and vegetables every day. It may seem like a lot, but it is not hard to reach this goal. A serving or helping is 1 piece of fruit, 1 cup of vegetables, or 2 cups of leafy, raw vegetables. Have an apple or some carrot sticks as an afternoon snack instead of a candy bar. Try to have fruits and/or vegetables at every meal.  ?? Make exercise part of your daily routine. You may want to start with simple activities, such as walking, bicycling, or slow swimming. Try to be active 30 to 60 minutes every day. You do not need to do all 30 to 60  minutes all at once. For example, you can exercise 3 times a day for 10 or 20 minutes. Moderate exercise is safe for most people, but it is always a good idea to talk to your doctor before starting an exercise program.  ?? Keep moving. Mow the lawn, work in the garden, or clean your house. Take the stairs instead of the elevator at work.  ?? If you smoke, quit. People who smoke have an increased risk for heart attack, stroke, cancer, and other lung illnesses. Quitting is hard, but there are ways to boost your chance of quitting tobacco for good.  ? Use nicotine gum, patches, or lozenges.  ? Ask your doctor about stop-smoking programs and medicines.  ? Keep trying.  In addition to reducing your risk of diseases in the future, you will notice some benefits soon after you stop using tobacco. If you have shortness of breath or asthma symptoms, they will likely get better within a few weeks after you quit.  ?? Limit how much alcohol you drink. Moderate amounts of alcohol (up to 2 drinks a day for men, 1 drink a day for women) are okay. But drinking too much can lead to liver problems, high blood pressure, and other health problems.  Family health  If you have a family, there are many things you   can do together to improve your health.  ?? Eat meals together as a family as often as possible.  ?? Eat healthy foods. This includes fruits, vegetables, lean meats and dairy, and whole grains.  ?? Include your family in your fitness plan. Most people think of activities such as jogging or tennis as the way to fitness, but there are many ways you and your family can be more active. Anything that makes you breathe hard and gets your heart pumping is exercise. Here are some tips:  ? Walk to do errands or to take your child to school or the bus.  ? Go for a family bike ride after dinner instead of watching TV.  Where can you learn more?  Go to http://www.healthwise.net/[employer].   Enter U807 in the search box to learn more about "A Healthy Lifestyle: Care Instructions."  Current as of: May 03, 2018  Content Version: 12.2  ?? 2006-2019 [employer]. Care instructions adapted under license by [employer] (which disclaims liability or warranty for this information). If you have questions about a medical condition or this instruction, always ask your healthcare professional. Healthwise, Inc[employer] disclaims any warranty or liability for your use of this information.

## 2018-10-28 NOTE — Progress Notes (Signed)
Elaine [name] is a 67 y.o. female in today for follow-up on memory loss.    Learning assessment previously completed 03/07/2018; primary language is [place].    1. Have you been to the ER, urgent care clinic since your last visit?  Hospitalized since your last visit?No    2. Have you seen or consulted any other health care providers outside of the [facility] since your last visit?  Include any pap smears or colon screening. No

## 2018-10-31 NOTE — Telephone Encounter (Signed)
Patient called to see if work note given can be extended to cover today and be faxed  To [phone] to job. Please advise pt [phone]

## 2018-10-31 NOTE — Telephone Encounter (Signed)
Please advise

## 2018-10-31 NOTE — Telephone Encounter (Signed)
Ok to extend the work note

## 2018-10-31 NOTE — Telephone Encounter (Signed)
Patient aware work note will be faxed tomorrow.

## 2018-11-07 ENCOUNTER — Encounter: Attending: Specialist | Primary: Internal Medicine

## 2018-11-21 ENCOUNTER — Encounter: Attending: Internal Medicine | Primary: Internal Medicine

## 2018-11-22 NOTE — Telephone Encounter (Signed)
Notification for Identification of potential gaps in care rec'd from [facility] and scanned to chart for review.

## 2018-11-29 NOTE — Telephone Encounter (Signed)
Item scanned to chart for review.

## 2018-12-20 ENCOUNTER — Ambulatory Visit: Admit: 2018-12-20 | Discharge: 2018-12-20 | Payer: MEDICARE | Attending: Internal Medicine | Primary: Internal Medicine

## 2018-12-20 ENCOUNTER — Ambulatory Visit: Attending: Internal Medicine | Primary: Internal Medicine

## 2018-12-20 DIAGNOSIS — Z0000 Encounter for general adult medical examination without abnormal findings: Secondary | ICD-10-CM

## 2018-12-20 MED ORDER — BENZONATATE 100 MG CAP
100 mg | ORAL_CAPSULE | Freq: Three times a day (TID) | ORAL | 0 refills | Status: AC | PRN
Start: 2018-12-20 — End: 2018-12-27

## 2018-12-20 MED ORDER — AZITHROMYCIN 250 MG TAB
250 mg | ORAL_TABLET | ORAL | 0 refills | Status: AC
Start: 2018-12-20 — End: 2018-12-25

## 2018-12-20 NOTE — Progress Notes (Signed)
Elaine [name] is a 68 y.o.  female and presents with Hypertension; Cholesterol Problem (f/u); and Annual Wellness Visit      SUBJECTIVE:  [name] HTN is well controlled on DiovanHct 160/25   [name] high cholesterol is borderline controlled on Lipitor 40 mg. Pt  denies any side effects like myalgias. Compliance with medications has been an issue.  Pt is controlling her prediabetes with diet and weight loss.      Thyroid Review:  Patient is seen for followup of hypothyroidism.   Thyroid ROS: denies fatigue, weight changes, heat/cold intolerance, bowel/skin changes or CVS symptoms. 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 Pt admits she misses doses of synthroid this is why her TSH has been on the high side.     [name] vit D level is well controlled on vit D 2000 units/day.     Pt has a h/o sleep apnea and needs to f/u with sleep specialist for further management.     Acute Bronchitis  Patient presents for presents evaluation of productive cough with sputum described as yellow. Symptoms began a few days ago and are unchanged since that time.  Past history is significant for no history of pneumonia or bronchitis.        Respiratory ROS: negative for - shortness of breath  Cardiovascular ROS: negative for - chest pain    Current Outpatient Medications   Medication Sig   ??? benzonatate (TESSALON) 100 mg capsule Take 1 Cap by mouth three (3) times daily as needed for Cough for up to 7 days.   ??? azithromycin (ZITHROMAX) 250 mg tablet Take 2 tablets today, then take 1 tablet daily   ??? terbinafine HCl (LAMISIL) 250 mg tablet Take 1 Tab by mouth daily.   ??? gabapentin (NEURONTIN) 300 mg capsule take 1 capsule by mouth every morning and 2 capsules by mouth at bedtime   ??? montelukast (SINGULAIR) 10 mg tablet TAKE 1 TABLET BY MOUTH AT BEDTIME IF NEEDED   ??? potassium chloride (KLOR-CON) 10 mEq tablet Take 1 Tab by mouth daily.   ??? atorvastatin  (LIPITOR) 40 mg tablet take 1 tablet by mouth once daily   ??? levothyroxine (SYNTHROID) 150 mcg tablet TAKE 1 TABLET BY MOUTH ONCE DAILY BEFORE BREAKFAST   ??? latanoprost (XALATAN) 0.005 % ophthalmic solution    ??? multivitamin (ONE A DAY) tablet Take 1 Tab by mouth daily.   ??? Cholecalciferol, Vitamin D3, (VITAMIN D-3) 5,000 unit Tab Take  by mouth daily.   ??? b complex vitamins tablet Take 1 Tab by mouth daily.   ??? carvediloL (COREG) 3.125 mg tablet Take 1 Tab by mouth two (2) times daily (with meals).   ??? valsartan-hydroCHLOROthiazide (DIOVAN-HCT) 160-25 mg per tablet Take 0.5 Tabs by mouth daily.     No current facility-administered medications for this visit.          OBJECTIVE:  alert, well appearing, and in no distress  Visit Vitals  BP 138/76 (BP 1 Location: Left arm, BP Patient Position: Sitting)   Pulse 88   Temp 98.7 ??F (37.1 ??C) (Oral)   Resp 20   Ht _0  (1.575 m)   Wt 194 lb (88 kg)   SpO2 95%   BMI 35.48 kg/m??      well developed and well nourished  Chest - clear to auscultation, no wheezes, rales or rhonchi, symmetric air entry  Heart - normal rate, regular rhythm, normal  S1, S2, no murmurs, rubs, clicks or gallops  Extremities - peripheral pulses normal, no pedal edema, no clubbing or cyanosis    Labs:   Lab Results   Component Value Date/Time    Cholesterol, total 197 10/10/2018 03:55 PM    HDL Cholesterol 62 (H) 10/10/2018 03:55 PM    LDL, calculated 117 (H) 10/10/2018 03:55 PM    Triglyceride 90 10/10/2018 03:55 PM    CHOL/HDL Ratio 3.2 10/10/2018 03:55 PM     Lab Results   Component Value Date/Time    Sodium 136 10/10/2018 03:55 PM    Potassium 3.5 10/10/2018 03:55 PM    Chloride 98 (L) 10/10/2018 03:55 PM    CO2 35 (H) 10/10/2018 03:55 PM    Anion gap 3 10/10/2018 03:55 PM    Glucose 125 (H) 10/10/2018 03:55 PM    BUN 10 10/10/2018 03:55 PM    Creatinine 0.83 10/10/2018 03:55 PM    BUN/Creatinine ratio 12 10/10/2018 03:55 PM    GFR est AA >60 10/10/2018 03:55 PM    GFR est non-AA >60 10/10/2018  03:55 PM    Calcium 9.5 10/10/2018 03:55 PM    Bilirubin, total 0.6 10/10/2018 03:55 PM    ALT (SGPT) 25 10/10/2018 03:55 PM    AST (SGOT) 14 10/10/2018 03:55 PM    Alk. phosphatase 88 10/10/2018 03:55 PM    Protein, total 7.0 10/10/2018 03:55 PM    Albumin 3.9 10/10/2018 03:55 PM    Globulin 3.1 10/10/2018 03:55 PM    A-G Ratio 1.3 10/10/2018 03:55 PM      Lab Results   Component Value Date/Time    Hemoglobin A1c 5.7 (H) 10/10/2018 03:55 PM      Labs:   Lab Results   Component Value Date/Time    TSH 30.80 (H) 10/10/2018 03:55 PM    T4, Free 1.3 03/29/2017 05:30 PM          Discussed the patient's BMI with her.  The BMI follow up plan is as follows: I have counseled this patient on diet and exercise regimens.        Assessment/Plan      ICD-10-CM ICD-9-CM    1. Initial Medicare annual wellness visit Z00.00 V70.0    2. HTN (hypertension), benign I10 401.1 Well controlled on DiovanHct METABOLIC PANEL, COMPREHENSIVE   3. 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 Vitamin D deficiency E55.9 268.9  well-controlled vitamin D 2000 is per day vITAMIN D, 25 HYDROXY   6. Obstructive sleep apnea syndrome G47.33 327.23 REFERRAL TO PULMONARY DISEASE   7. Cough R05 786.2 benzonatate (TESSALON) 100 mg capsule   8. Bronchitis J40 490 azithromycin (ZITHROMAX) 250 mg tablet   9. Pre-diabetes R73.03 790.29  try and improve with diet and weight loss hEMOGLOBIN A1C W/O EAG       Follow-up and Dispositions    ?? Return in about 4 months (around 04/20/2019) for labs 1 week before.           Reviewed plan of care. Patient has provided input and agrees with goals.

## 2018-12-20 NOTE — Progress Notes (Signed)
Patient is in the office today for 1 month follow up and medicare wellness visit.    1. Have you been to the ER, urgent care clinic since your last visit?  Hospitalized since your last visit?No    2. Have you seen or consulted any other health care providers outside of the [facility] since your last visit?  Include any pap smears or colon screening. No        This is an Initial Medicare Annual Wellness Exam (AWV) (Performed 12 months after IPPE or effective date of Medicare Part B enrollment, Once in a lifetime)    I have reviewed the patient's medical history in detail and updated the computerized patient record.     History     Patient Active Problem List   Diagnosis Code   ??? DOE (dyspnea on exertion) R06.09   ??? Hypothyroidism E03.9   ??? Pure hypercholesterolemia E78.00   ??? HTN (hypertension), benign I10   ??? GERD (gastroesophageal reflux disease) K21.9   ??? Asthma J45.909   ??? Vitamin D deficiency E55.9   ??? Iron deficiency anemia D50.9   ??? Submental lymphadenopathy R59.0   ??? Sciatica M54.30   ??? Depression F32.9   ??? Meralgia paraesthetica G57.10   ??? Obesity (BMI 30-39.9) E66.9   ??? Pre-diabetes R73.03   ??? Abnormal stress test R94.39   ??? Need for hepatitis C screening test Z11.59   ??? Glaucoma screening Z13.5   ??? Left knee pain M25.562   ??? Swelling of left lower extremity M79.89   ??? Osteoarthritis of left knee M17.12   ??? Post-menopausal Z78.0   ??? 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 Dehydration E86.0   ??? Migraine without aura and without status migrainosus, not intractable G43.009   ??? Claustrophobia F40.240   ??? Chronic midline low back pain without sciatica M54.5, G89.29   ??? Rash R21   ??? Encounter for long-term (current) use of medications Z79.899   ??? URI (upper respiratory infection) J06.9   ??? Cough R05   ??? Routine general medical examination at a health care facility Z00.00   ??? Need for pneumococcal vaccination Z23   ??? Hospital discharge follow-up Z09   ??? Chest pain R07.9   ??? Chronic diastolic  congestive heart failure (HCC) I50.32   ??? Severe obesity (BMI 35.0-39.9) with comorbidity ([place]) E66.01   ??? Sore throat J02.9   ??? Memory problem R41.3     Past Medical History:   Diagnosis Date   ??? Anemia    ??? Asthma    ??? Colon polyp    ??? DDD (degenerative disc disease) 02/15/2009   ??? Depression 12/18/2011   ??? Essential hypertension, benign    ??? History of echocardiogram 03/20/2004    EF 70%.  Borderline DDfx.  No significant valvular pathology.   ??? Hypercholesterolemia    ??? Hypertension    ??? Iron deficiency anemia 1/88/4166   ??? 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 Lower extremity venous duplex 10/23/2009    Left leg:  No DVT.     ??? Meralgia paraesthetica 10/22/2013   ??? OA (osteoarthritis of spine) 02/15/2009   ??? OA (osteoarthritis of spine) 02/15/2009   ??? Obesity, unspecified    ??? Other and unspecified hyperlipidemia    ??? Pre-operative cardiovascular examination     For spine surgery   ??? Right sided sciatica    ??? Shortness of breath     Possible asthma, HCVD; less likely CAD (Noted  03/15/09)   ??? Sleep apnea 02/15/2009    uses cpap machine   ??? Thallium stress test  03/20/2004    Partially transient, mod basal & mid anterior defect most c/w artifact; mild anterior ischemia less likely.  Neg EKG on pharm stress test.   ??? Thyroid disease     hypothyroidism      Past Surgical History:   Procedure Laterality Date   ??? HX COLONOSCOPY  02-24-12    normal (Dr. Angelique Holm)   ??? HX HERNIA REPAIR      3x   ??? HX HYSTERECTOMY     ??? HX TUBAL LIGATION     ??? NEUROLOGICAL PROCEDURE UNLISTED  03-16-2009    s/p ACD & Fusion (Dr.P.Gurtner)   ??? PR COLONOSCOPY FLX DX W/COLLJ SPEC WHEN PFRMD  02-2007    +polyp(tubular adenoma); Dr Angelique Holm     Current Outpatient Medications   Medication Sig Dispense Refill   ??? benzonatate (TESSALON) 100 mg capsule Take 1 Cap by mouth three (3) times daily as needed for Cough for up to 7 days. 21 Cap 0   ??? azithromycin (ZITHROMAX) 250 mg tablet Take 2 tablets today, then take 1 tablet daily 6 Tab 0   ??? terbinafine HCl (LAMISIL) 250  mg tablet Take 1 Tab by mouth daily. 90 Tab 0   ??? gabapentin (NEURONTIN) 300 mg capsule take 1 capsule by mouth every morning and 2 capsules by mouth at bedtime 180 Cap 1   ??? montelukast (SINGULAIR) 10 mg tablet TAKE 1 TABLET BY MOUTH AT BEDTIME IF NEEDED 90 Tab 1   ??? potassium chloride (KLOR-CON) 10 mEq tablet Take 1 Tab by mouth daily. 90 Tab 3   ??? atorvastatin (LIPITOR) 40 mg tablet take 1 tablet by mouth once daily 30 Tab 5   ??? levothyroxine (SYNTHROID) 150 mcg tablet TAKE 1 TABLET BY MOUTH ONCE DAILY BEFORE BREAKFAST 90 Tab 1   ??? latanoprost (XALATAN) 0.005 % ophthalmic solution      ??? multivitamin (ONE A DAY) tablet Take 1 Tab by mouth daily.     ??? Cholecalciferol, Vitamin D3, (VITAMIN D-3) 5,000 unit Tab Take  by mouth daily.     ??? b complex vitamins tablet Take 1 Tab by mouth daily.     ??? carvediloL (COREG) 3.125 mg tablet Take 1 Tab by mouth two (2) times daily (with meals). 60 Tab 2   ??? valsartan-hydroCHLOROthiazide (DIOVAN-HCT) 160-25 mg per tablet Take 0.5 Tabs by mouth daily. 90 Tab 3     Allergies   Allergen Reactions   ??? Asa-Acetaminophen-Caff-Potass Other (comments)     Bleeding in stomach   ??? Benadryl [Diphenhydramine Hcl] Other (comments)     Muscle jerking   ??? Other Plant, Animal, Environmental Not Reported This Time     Grass, Dust and Mold   ??? Pollen Extracts Not Reported This Time       Family History   Problem Relation Age of Onset   ??? Cancer Mother    ??? Hypertension Mother    ??? Hypertension Sister         x3   ??? Heart Surgery Sister    ??? Heart Disease Sister    ??? Heart Attack Sister    ??? Cancer Maternal Aunt         breast   ??? Breast Cancer Maternal Aunt    ??? Glaucoma Son    ??? Heart defect Son    ??? Diabetes Paternal Uncle    ???  Hypertension Paternal Uncle      Social History     Tobacco Use   ??? Smoking status: Former Smoker     Packs/day: 1.00     Years: 28.00     Pack years: 28.00     Last attempt to quit: 04/25/2004     Years since quitting: 14.6   ??? Smokeless tobacco: Never Used   Substance  Use Topics   ??? Alcohol use: No       Depression Risk Factor Screening:     3 most recent PHQ Screens 10/14/2018   Little interest or pleasure in doing things Not at all   Feeling down, depressed, irritable, or hopeless Not at all   Total Score PHQ 2 0       Alcohol Risk Factor Screening:   Do you average 1 drink per night or more than 7 drinks a week:  No    On any one occasion in the past three months have you have had more than 3 drinks containing alcohol:  No      Functional Ability and Level of Safety:   Hearing: Hearing is good.    Activities of Daily Living:  The home contains: no safety equipment.  Patient does total self care     Ambulation: with no difficulty    Fall Risk:  Fall Risk Assessment, last 12 mths 12/20/2018   Able to walk? Yes   Fall in past 12 months? No       Abuse Screen:  Patient is not abused    Cognitive Screening   Has your family/caregiver stated any concerns about your memory: no  Cognitive Screening: Normal     Patient Care Team   Patient Care Team:  Thana Farr, MD as PCP - General (Internal Medicine)  Thana Farr, MD as PCP - [facility] Empaneled Provider  Vernetta Honey, MD (Ophthalmology)  Sloan Leiter, MD (Gastroenterology)  Jonette Eva, MD (Internal Medicine)  Donnelly Stager, MD (Neurology)  Carlyle Dolly, MD (Cardiology)  Fredirick Lathe, MD (Allergy)    Glaucoma Screening-  UTD  Pneumonia Vaccine-  UTD  Shingles Vaccine-  Pt aware of Shingrinx  Tdap Vaccine- 10/2013  Colonoscopy-  03/2015 Dr Angelique Holm f/u 03/2021 ? No path results found  Mammogram 08/2018  Advance Directive-  Pt given information     Assessment/Plan   Education and counseling provided:  Are appropriate based on today's review and evaluation  End-of-Life planning (with patient's consent)    Diagnoses and all orders for this visit:    1. Initial Medicare annual wellness visit    2. HTN (hypertension), benign  -     METABOLIC PANEL, COMPREHENSIVE; Future    3. Hypercholesteremia  -     LIPID PANEL;  Future    4. Postablative hypothyroidism  -     TSH 3RD GENERATION; Future    5. Vitamin D deficiency  -     VITAMIN D, 25 HYDROXY; Future    6. Obstructive sleep apnea syndrome  -     REFERRAL TO PULMONARY DISEASE    7. Cough  -     benzonatate (TESSALON) 100 mg capsule; Take 1 Cap by mouth three (3) times daily as needed for Cough for up to 7 days.    8. Bronchitis  -     azithromycin (ZITHROMAX) 250 mg tablet; Take 2 tablets today, then take 1 tablet daily    9. Pre-diabetes  -  HEMOGLOBIN A1C W/O EAG; Future         Health Maintenance Due   Topic Date Due   ??? Shingrix Vaccine Age 27> (1 of 2) 01/29/2000   ??? COLONOSCOPY  02/17/2012   ??? FOBT Q 1 YEAR, 18+  07/27/2015     Reviewed plan of care. Patient has provided input and agrees with goals.

## 2018-12-20 NOTE — Patient Instructions (Signed)
Medicare Part B Preventive Services Limitations Recommendation Scheduled   Bone Mass Measurement  (age 69 & older, biennial) A bone mass density test is recommended when a woman turns 65 to screen for osteoporosis. This test is only recommended one time, as a screening. Some providers will use this same test as a disease monitoring tool if you already have osteoporosis.  2/17   Cardiovascular Screening Blood Tests (every 5 years)  Total cholesterol, HDL, Triglycerides and ECG Order blood work  as a [employer] if possible and adults with routine risk  an electrocardiogram (ECG) at intervals determined by your doctor.   Lipid 11/19   Colorectal Cancer Screening  -Fecal occult blood test (annual)  -Flexible sigmoidoscopy (5y)  -Screening colonoscopy (10y)  -Barium Enema Colorectal cancer screenings should be done for adults age 2-75 with no increased risk factors for colorectal cancer.  There are a number of acceptable methods of screening for this type of cancer. Each test has its own benefits and drawbacks. Discuss with your doctor what is most appropriate for you during your annual wellness visit. The different tests include: colonoscopy (considered the best screening method), a fecal occult blood test, a fecal DNA test, and sigmoidoscopy.  order   Counseling to Prevent Tobacco Use (up to 8 sessions per year)  - Counseling greater than 3 and up to 10 minutes  - Counseling greater than 10 minutes Patients must be asymptomatic of tobacco-related conditions to receive as preventive service     Diabetes Screening Tests (at least every 3 years, Medicare covers annually or at 13-month intervals for prediabetic patients)    Fasting blood sugar (FBS) or glucose tolerance test (GTT) -All adults age 39-70 who are overweight should have a diabetes screening test once every three years.   -Other screening tests and preventive services for persons with diabetes  include: an eye exam to screen for diabetic retinopathy, a kidney function test, a foot exam, and stricter control over your cholesterol.   11/19   Diabetes Self-Management Training (DSMT) (no USPSTF recommendation) Requires referral by treating physician for patient with diabetes or renal disease. 10 hours of initial DSMT session of no less than 30 minutes each in a continuous 53-month period.  2 hours of follow-up DSMT in subsequent years.     Glaucoma Screening (no USPSTF recommendation) Diabetes mellitus, family history, African American, age 74 or over, Hispanic American, age 31 or over  9/19   Human Immunodeficiency Virus (HIV) Screening (annually for increased risk patients)  HIV-1 and HIV-2 by EIA, ELISA, rapid antibody test, or oral mucosa transudate Patient must be at increased risk for HIV infection per USPSTF guidelines or pregnant.  Tests covered annually for patients at increased risk.  Pregnant patients may receive up to 3 test during pregnancy.     Medical Nutrition Therapy (MNT) (for diabetes or renal disease not recommended schedule) Requires referral by treating physician for patient with diabetes or renal disease.  Can be provided in same year as diabetes self-management training (DSMT), and CMS recommends medical nutrition therapy take place after DSMT.  Up to 3 hours for initial year and 2 hours in subsequent years.     Shingles Vaccination A shingles vaccine is also recommended once in a lifetime after age 78  7/16   Seasonal Influenza Vaccination (annually) All adults should have a flu vaccine yearly   11/19   Pneumococcal Vaccination (once after 44) All adults over the age of 52 should receive the recommended pneumonia vaccines. Current USPSTF  guidelines recommend a series of two vaccines for the best pneumonia protection.   pcv13 3/17  ppsv23 11/19   Hepatitis B Vaccinations (if medium/high risk) Medium/high risk factors:  End-stage renal disease,   Hemophiliacs who received Factor VIII or IX concentrates, Clients of institutions for the mentally retarded, Persons who live in the same house as a HepB virus carrier, Homosexual men, Illicit injectable drug abusers.     Screening Mammography (biennial age 42-74) Breast cancer screenings are recommended every other year for women of normal risk, age 59-74.  9/10   Screening Pap Tests and Pelvic Examination (up to age 1 and after 34 if unknown history or abnormal study last 20 years) Cervical cancer screenings for women over age 63 are only recommended with certain risk factors     Hepatitis C All adults born between [birth year] and 1966 should be screened once   12/16     Tetanus  All adults should have a tetanus vaccine every 10 years  11/14

## 2018-12-20 NOTE — Progress Notes (Signed)
Elaine [name] is a 68 y.o.  female and presents with Hypertension; Cholesterol Problem (f/u); and Annual Wellness Visit      SUBJECTIVE:  [name] HTN is well controlled on DiovanHct 160/25   [name] high cholesterol is borderline controlled on Lipitor 40 mg. Pt  denies any side effects like myalgias. Compliance with medications has been an issue.  Pt is controlling her prediabetes with diet and weight loss.      Thyroid Review:  Patient is seen for followup of hypothyroidism.   Thyroid ROS: denies fatigue, weight changes, heat/cold intolerance, bowel/skin changes or CVS symptoms. 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 Pt admits she misses doses of synthroid this is why her TSH has been on the high side.     [name] vit D level is well controlled on vit D 2000 units/day.     Pt has a h/o sleep apnea and needs to f/u with sleep specialist for further management.     Acute Bronchitis  Patient presents for presents evaluation of productive cough with sputum described as yellow. Symptoms began a few days ago and are unchanged since that time.  Past history is significant for no history of pneumonia or bronchitis.        Respiratory ROS: negative for - shortness of breath  Cardiovascular ROS: negative for - chest pain    Current Outpatient Medications   Medication Sig   ??? benzonatate (TESSALON) 100 mg capsule Take 1 Cap by mouth three (3) times daily as needed for Cough for up to 7 days.   ??? azithromycin (ZITHROMAX) 250 mg tablet Take 2 tablets today, then take 1 tablet daily   ??? terbinafine HCl (LAMISIL) 250 mg tablet Take 1 Tab by mouth daily.   ??? gabapentin (NEURONTIN) 300 mg capsule take 1 capsule by mouth every morning and 2 capsules by mouth at bedtime   ??? montelukast (SINGULAIR) 10 mg tablet TAKE 1 TABLET BY MOUTH AT BEDTIME IF NEEDED   ??? potassium chloride (KLOR-CON) 10 mEq tablet Take 1 Tab by mouth daily.    ??? atorvastatin (LIPITOR) 40 mg tablet take 1 tablet by mouth once daily   ??? levothyroxine (SYNTHROID) 150 mcg tablet TAKE 1 TABLET BY MOUTH ONCE DAILY BEFORE BREAKFAST   ??? latanoprost (XALATAN) 0.005 % ophthalmic solution    ??? multivitamin (ONE A DAY) tablet Take 1 Tab by mouth daily.   ??? Cholecalciferol, Vitamin D3, (VITAMIN D-3) 5,000 unit Tab Take  by mouth daily.   ??? b complex vitamins tablet Take 1 Tab by mouth daily.   ??? carvediloL (COREG) 3.125 mg tablet Take 1 Tab by mouth two (2) times daily (with meals).   ??? valsartan-hydroCHLOROthiazide (DIOVAN-HCT) 160-25 mg per tablet Take 0.5 Tabs by mouth daily.     No current facility-administered medications for this visit.          OBJECTIVE:  alert, well appearing, and in no distress  Visit Vitals  BP 138/76 (BP 1 Location: Left arm, BP Patient Position: Sitting)   Pulse 88   Temp 98.7 ??F (37.1 ??C) (Oral)   Resp 20   Ht '5\' 2"'$  (1.575 m)   Wt 194 lb (88 kg)   SpO2 95%   BMI 35.48 kg/m??      well developed and well nourished  Chest - clear to auscultation, no wheezes, rales or rhonchi, symmetric air entry  Heart - normal rate, regular rhythm, normal  S1, S2, no murmurs, rubs, clicks or gallops  Extremities - peripheral pulses normal, no pedal edema, no clubbing or cyanosis    Labs:   Lab Results   Component Value Date/Time    Cholesterol, total 197 10/10/2018 03:55 PM    HDL Cholesterol 62 (H) 10/10/2018 03:55 PM    LDL, calculated 117 (H) 10/10/2018 03:55 PM    Triglyceride 90 10/10/2018 03:55 PM    CHOL/HDL Ratio 3.2 10/10/2018 03:55 PM     Lab Results   Component Value Date/Time    Sodium 136 10/10/2018 03:55 PM    Potassium 3.5 10/10/2018 03:55 PM    Chloride 98 (L) 10/10/2018 03:55 PM    CO2 35 (H) 10/10/2018 03:55 PM    Anion gap 3 10/10/2018 03:55 PM    Glucose 125 (H) 10/10/2018 03:55 PM    BUN 10 10/10/2018 03:55 PM    Creatinine 0.83 10/10/2018 03:55 PM    BUN/Creatinine ratio 12 10/10/2018 03:55 PM    GFR est AA >60 10/10/2018 03:55 PM     GFR est non-AA >60 10/10/2018 03:55 PM    Calcium 9.5 10/10/2018 03:55 PM    Bilirubin, total 0.6 10/10/2018 03:55 PM    ALT (SGPT) 25 10/10/2018 03:55 PM    AST (SGOT) 14 10/10/2018 03:55 PM    Alk. phosphatase 88 10/10/2018 03:55 PM    Protein, total 7.0 10/10/2018 03:55 PM    Albumin 3.9 10/10/2018 03:55 PM    Globulin 3.1 10/10/2018 03:55 PM    A-G Ratio 1.3 10/10/2018 03:55 PM      Lab Results   Component Value Date/Time    Hemoglobin A1c 5.7 (H) 10/10/2018 03:55 PM      Labs:   Lab Results   Component Value Date/Time    TSH 30.80 (H) 10/10/2018 03:55 PM    T4, Free 1.3 03/29/2017 05:30 PM          Discussed the patient's BMI with her.  The BMI follow up plan is as follows: I have counseled this patient on diet and exercise regimens.        Assessment/Plan      ICD-10-CM ICD-9-CM    1. Initial Medicare annual wellness visit Z00.00 V70.0    2. HTN (hypertension), benign I10 401.1 Well controlled on DiovanHct METABOLIC PANEL, COMPREHENSIVE   3. 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 Vitamin D deficiency E55.9 268.9  well-controlled vitamin D 2000 is per day vITAMIN D, 25 HYDROXY   6. Obstructive sleep apnea syndrome G47.33 327.23 REFERRAL TO PULMONARY DISEASE   7. Cough R05 786.2 benzonatate (TESSALON) 100 mg capsule   8. Bronchitis J40 490 azithromycin (ZITHROMAX) 250 mg tablet   9. Pre-diabetes R73.03 790.29  try and improve with diet and weight loss hEMOGLOBIN A1C W/O EAG       Follow-up and Dispositions    ?? Return in about 4 months (around 04/20/2019) for labs 1 week before.           Reviewed plan of care. Patient has provided input and agrees with goals.

## 2018-12-20 NOTE — Progress Notes (Signed)
Patient is in the office today for 1 month follow up and medicare wellness visit.    1. Have you been to the ER, urgent care clinic since your last visit?  Hospitalized since your last visit?No    2. Have you seen or consulted any other health care providers outside of the [facility] since your last visit?  Include any pap smears or colon screening. No        This is an Initial Medicare Annual Wellness Exam (AWV) (Performed 12 months after IPPE or effective date of Medicare Part B enrollment, Once in a lifetime)    I have reviewed the patient's medical history in detail and updated the computerized patient record.     History     Patient Active Problem List   Diagnosis Code   ??? DOE (dyspnea on exertion) R06.09   ??? Hypothyroidism E03.9   ??? Pure hypercholesterolemia E78.00   ??? HTN (hypertension), benign I10   ??? GERD (gastroesophageal reflux disease) K21.9   ??? Asthma J45.909   ??? Vitamin D deficiency E55.9   ??? Iron deficiency anemia D50.9   ??? Submental lymphadenopathy R59.0   ??? Sciatica M54.30   ??? Depression F32.9   ??? Meralgia paraesthetica G57.10   ??? Obesity (BMI 30-39.9) E66.9   ??? Pre-diabetes R73.03   ??? Abnormal stress test R94.39   ??? Need for hepatitis C screening test Z11.59   ??? Glaucoma screening Z13.5   ??? Left knee pain M25.562   ??? Swelling of left lower extremity M79.89   ??? Osteoarthritis of left knee M17.12   ??? Post-menopausal Z78.0   ??? 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 Dehydration E86.0   ??? Migraine without aura and without status migrainosus, not intractable G43.009   ??? Claustrophobia F40.240   ??? Chronic midline low back pain without sciatica M54.5, G89.29   ??? Rash R21   ??? Encounter for long-term (current) use of medications Z79.899   ??? URI (upper respiratory infection) J06.9   ??? Cough R05   ??? Routine general medical examination at a health care facility Z00.00   ??? Need for pneumococcal vaccination Z23   ??? Hospital discharge follow-up Z09   ??? Chest pain R07.9    ??? Chronic diastolic congestive heart failure (HCC) I50.32   ??? Severe obesity (BMI 35.0-39.9) with comorbidity ([place]) E66.01   ??? Sore throat J02.9   ??? Memory problem R41.3     Past Medical History:   Diagnosis Date   ??? Anemia    ??? Asthma    ??? Colon polyp    ??? DDD (degenerative disc disease) 02/15/2009   ??? Depression 12/18/2011   ??? Essential hypertension, benign    ??? History of echocardiogram 03/20/2004    EF 70%.  Borderline DDfx.  No significant valvular pathology.   ??? Hypercholesterolemia    ??? Hypertension    ??? Iron deficiency anemia 1/61/0960   ??? 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 Lower extremity venous duplex 10/23/2009    Left leg:  No DVT.     ??? Meralgia paraesthetica 10/22/2013   ??? OA (osteoarthritis of spine) 02/15/2009   ??? OA (osteoarthritis of spine) 02/15/2009   ??? Obesity, unspecified    ??? Other and unspecified hyperlipidemia    ??? Pre-operative cardiovascular examination     For spine surgery   ??? Right sided sciatica    ??? Shortness of breath     Possible asthma, HCVD; less likely CAD (Noted  03/15/09)   ??? Sleep apnea 02/15/2009    uses cpap machine   ??? Thallium stress test  03/20/2004    Partially transient, mod basal & mid anterior defect most c/w artifact; mild anterior ischemia less likely.  Neg EKG on pharm stress test.   ??? Thyroid disease     hypothyroidism      Past Surgical History:   Procedure Laterality Date   ??? HX COLONOSCOPY  02-24-12    normal (Dr. Angelique Holm)   ??? HX HERNIA REPAIR      3x   ??? HX HYSTERECTOMY     ??? HX TUBAL LIGATION     ??? NEUROLOGICAL PROCEDURE UNLISTED  03-16-2009    s/p ACD & Fusion (Dr.P.Gurtner)   ??? PR COLONOSCOPY FLX DX W/COLLJ SPEC WHEN PFRMD  02-2007    +polyp(tubular adenoma); Dr Angelique Holm     Current Outpatient Medications   Medication Sig Dispense Refill   ??? benzonatate (TESSALON) 100 mg capsule Take 1 Cap by mouth three (3) times daily as needed for Cough for up to 7 days. 21 Cap 0   ??? azithromycin (ZITHROMAX) 250 mg tablet Take 2 tablets today, then take 1 tablet daily 6 Tab 0    ??? terbinafine HCl (LAMISIL) 250 mg tablet Take 1 Tab by mouth daily. 90 Tab 0   ??? gabapentin (NEURONTIN) 300 mg capsule take 1 capsule by mouth every morning and 2 capsules by mouth at bedtime 180 Cap 1   ??? montelukast (SINGULAIR) 10 mg tablet TAKE 1 TABLET BY MOUTH AT BEDTIME IF NEEDED 90 Tab 1   ??? potassium chloride (KLOR-CON) 10 mEq tablet Take 1 Tab by mouth daily. 90 Tab 3   ??? atorvastatin (LIPITOR) 40 mg tablet take 1 tablet by mouth once daily 30 Tab 5   ??? levothyroxine (SYNTHROID) 150 mcg tablet TAKE 1 TABLET BY MOUTH ONCE DAILY BEFORE BREAKFAST 90 Tab 1   ??? latanoprost (XALATAN) 0.005 % ophthalmic solution      ??? multivitamin (ONE A DAY) tablet Take 1 Tab by mouth daily.     ??? Cholecalciferol, Vitamin D3, (VITAMIN D-3) 5,000 unit Tab Take  by mouth daily.     ??? b complex vitamins tablet Take 1 Tab by mouth daily.     ??? carvediloL (COREG) 3.125 mg tablet Take 1 Tab by mouth two (2) times daily (with meals). 60 Tab 2   ??? valsartan-hydroCHLOROthiazide (DIOVAN-HCT) 160-25 mg per tablet Take 0.5 Tabs by mouth daily. 90 Tab 3     Allergies   Allergen Reactions   ??? Asa-Acetaminophen-Caff-Potass Other (comments)     Bleeding in stomach   ??? Benadryl [Diphenhydramine Hcl] Other (comments)     Muscle jerking   ??? Other Plant, Animal, Environmental Not Reported This Time     Grass, Dust and Mold   ??? Pollen Extracts Not Reported This Time       Family History   Problem Relation Age of Onset   ??? Cancer Mother    ??? Hypertension Mother    ??? Hypertension Sister         x3   ??? Heart Surgery Sister    ??? Heart Disease Sister    ??? Heart Attack Sister    ??? Cancer Maternal Aunt         breast   ??? Breast Cancer Maternal Aunt    ??? Glaucoma Son    ??? Heart defect Son    ??? Diabetes Paternal Uncle    ???  Hypertension Paternal Uncle      Social History     Tobacco Use   ??? Smoking status: Former Smoker     Packs/day: 1.00     Years: 28.00     Pack years: 28.00     Last attempt to quit: 04/25/2004     Years since quitting: 14.6    ??? Smokeless tobacco: Never Used   Substance Use Topics   ??? Alcohol use: No       Depression Risk Factor Screening:     3 most recent PHQ Screens 10/14/2018   Little interest or pleasure in doing things Not at all   Feeling down, depressed, irritable, or hopeless Not at all   Total Score PHQ 2 0       Alcohol Risk Factor Screening:   Do you average 1 drink per night or more than 7 drinks a week:  No    On any one occasion in the past three months have you have had more than 3 drinks containing alcohol:  No      Functional Ability and Level of Safety:   Hearing: Hearing is good.    Activities of Daily Living:  The home contains: no safety equipment.  Patient does total self care     Ambulation: with no difficulty    Fall Risk:  Fall Risk Assessment, last 12 mths 12/20/2018   Able to walk? Yes   Fall in past 12 months? No       Abuse Screen:  Patient is not abused    Cognitive Screening   Has your family/caregiver stated any concerns about your memory: no  Cognitive Screening: Normal     Patient Care Team   Patient Care Team:  Thana Farr, MD as PCP - General (Internal Medicine)  Thana Farr, MD as PCP - [facility] Empaneled Provider  Vernetta Honey, MD (Ophthalmology)  Sloan Leiter, MD (Gastroenterology)  Jonette Eva, MD (Internal Medicine)  Donnelly Stager, MD (Neurology)  Carlyle Dolly, MD (Cardiology)  Fredirick Lathe, MD (Allergy)    Glaucoma Screening-  UTD  Pneumonia Vaccine-  UTD  Shingles Vaccine-  Pt aware of Shingrinx  Tdap Vaccine- 10/2013  Colonoscopy-  03/2015 Dr Angelique Holm f/u 03/2021 ? No path results found  Mammogram 08/2018  Advance Directive-  Pt given information     Assessment/Plan   Education and counseling provided:  Are appropriate based on today's review and evaluation  End-of-Life planning (with patient's consent)    Diagnoses and all orders for this visit:    1. Initial Medicare annual wellness visit    2. HTN (hypertension), benign  -     METABOLIC PANEL, COMPREHENSIVE; Future     3. Hypercholesteremia  -     LIPID PANEL; Future    4. Postablative hypothyroidism  -     TSH 3RD GENERATION; Future    5. Vitamin D deficiency  -     VITAMIN D, 25 HYDROXY; Future    6. Obstructive sleep apnea syndrome  -     REFERRAL TO PULMONARY DISEASE    7. Cough  -     benzonatate (TESSALON) 100 mg capsule; Take 1 Cap by mouth three (3) times daily as needed for Cough for up to 7 days.    8. Bronchitis  -     azithromycin (ZITHROMAX) 250 mg tablet; Take 2 tablets today, then take 1 tablet daily    9. Pre-diabetes  -  HEMOGLOBIN A1C W/O EAG; Future         Health Maintenance Due   Topic Date Due   ??? Shingrix Vaccine Age 30> (1 of 2) 01/29/2000   ??? COLONOSCOPY  02/17/2012   ??? FOBT Q 1 YEAR, 18+  07/27/2015     Reviewed plan of care. Patient has provided input and agrees with goals.

## 2018-12-23 ENCOUNTER — Encounter: Attending: Specialist | Primary: Internal Medicine

## 2018-12-23 ENCOUNTER — Ambulatory Visit: Admit: 2018-12-23 | Discharge: 2018-12-23 | Payer: MEDICARE | Attending: Specialist | Primary: Internal Medicine

## 2018-12-23 ENCOUNTER — Ambulatory Visit: Attending: Specialist | Primary: Internal Medicine

## 2018-12-23 DIAGNOSIS — I5032 Chronic diastolic (congestive) heart failure: Secondary | ICD-10-CM

## 2018-12-23 MED ORDER — CARVEDILOL 3.125 MG TAB
3.125 mg | ORAL_TABLET | Freq: Two times a day (BID) | ORAL | 2 refills | Status: DC
Start: 2018-12-23 — End: 2019-07-28

## 2018-12-23 MED ORDER — VALSARTAN-HYDROCHLOROTHIAZIDE 160 MG-25 MG TAB
160-25 mg | ORAL_TABLET | Freq: Every day | ORAL | 3 refills | Status: DC
Start: 2018-12-23 — End: 2019-06-06

## 2018-12-23 NOTE — Progress Notes (Signed)
HISTORY OF PRESENT ILLNESS  Elaine [name] is a 68 y.o. female.    05/2018 - Mild SOB today related to asthma - improved with inhaler use.    Hypertension   The history is provided by the medical records. This is a chronic problem. Associated symptoms include shortness of breath.   Cholesterol Problem   The history is provided by the medical records. This is a chronic problem. Associated symptoms include shortness of breath.   Shortness of Breath   The history is provided by the patient. This is a new problem. The problem occurs intermittently.The current episode started more than 1 week ago (9/17). The problem has not changed since onset.Pertinent negatives include no fever, no cough, no wheezing, no PND, no orthopnea, no vomiting, no rash, no leg swelling and no claudication. The problem's precipitants include exercise (steps).   CHF   Associated symptoms include shortness of breath.       Review of Systems   Constitutional: Negative for chills, fever, malaise/fatigue and weight loss.   HENT: Negative for nosebleeds.    Eyes: Negative for discharge.   Respiratory: Positive for shortness of breath. Negative for cough and wheezing.    Cardiovascular: Negative for palpitations, orthopnea, claudication, leg swelling and PND.   Gastrointestinal: Negative for diarrhea, nausea and vomiting.   Genitourinary: Negative for dysuria and hematuria.   Musculoskeletal: Negative for joint pain.   Skin: Negative for rash.   Neurological: Negative for dizziness, seizures and loss of consciousness.   Endo/Heme/Allergies: Negative for polydipsia. Does not bruise/bleed easily.   Psychiatric/Behavioral: Negative for depression and substance abuse. The patient does not have insomnia.      Allergies   Allergen Reactions   ??? Asa-Acetaminophen-Caff-Potass Other (comments)     Bleeding in stomach   ??? Benadryl [Diphenhydramine Hcl] Other (comments)     Muscle jerking   ??? Other Plant, Animal, Environmental Not Reported This Time     Grass,  Dust and Mold   ??? Pollen Extracts Not Reported This Time       Past Medical History:   Diagnosis Date   ??? Anemia    ??? Asthma    ??? Colon polyp    ??? DDD (degenerative disc disease) 02/15/2009   ??? Depression 12/18/2011   ??? Essential hypertension, benign    ??? History of echocardiogram 03/20/2004    EF 70%.  Borderline DDfx.  No significant valvular pathology.   ??? Hypercholesterolemia    ??? Hypertension    ??? Iron deficiency anemia 0/93/8182   ??? Lichen planus 9/93/7169   ??? Lower extremity venous duplex 10/23/2009    Left leg:  No DVT.     ??? Meralgia paraesthetica 10/22/2013   ??? OA (osteoarthritis of spine) 02/15/2009   ??? OA (osteoarthritis of spine) 02/15/2009   ??? Obesity, unspecified    ??? Other and unspecified hyperlipidemia    ??? Pre-operative cardiovascular examination     For spine surgery   ??? Right sided sciatica    ??? Shortness of breath     Possible asthma, HCVD; less likely CAD (Noted 03/15/09)   ??? Sleep apnea 02/15/2009    uses cpap machine   ??? Thallium stress test  03/20/2004    Partially transient, mod basal & mid anterior defect most c/w artifact; mild anterior ischemia less likely.  Neg EKG on pharm stress test.   ??? Thyroid disease     hypothyroidism       Family History   Problem Relation  Age of Onset   ??? Cancer Mother    ??? Hypertension Mother    ??? Hypertension Sister         x3   ??? Heart Surgery Sister    ??? Heart Disease Sister    ??? Heart Attack Sister    ??? Cancer Maternal Aunt         breast   ??? Breast Cancer Maternal Aunt    ??? Glaucoma Son    ??? Heart defect Son    ??? Diabetes Paternal Uncle    ??? Hypertension Paternal Uncle        Social History     Tobacco Use   ??? Smoking status: Former Smoker     Packs/day: 1.00     Years: 28.00     Pack years: 28.00     Last attempt to quit: 04/25/2004     Years since quitting: 14.6   ??? Smokeless tobacco: Never Used   Substance Use Topics   ??? Alcohol use: No   ??? Drug use: No        Current Outpatient Medications   Medication Sig   ??? b complex vitamins tablet Take 1 Tab by mouth  daily.   ??? carvediloL (COREG) 6.25 mg tablet Take 6.25 mg by mouth as needed.   ??? benzonatate (TESSALON) 100 mg capsule Take 1 Cap by mouth three (3) times daily as needed for Cough for up to 7 days.   ??? azithromycin (ZITHROMAX) 250 mg tablet Take 2 tablets today, then take 1 tablet daily   ??? terbinafine HCl (LAMISIL) 250 mg tablet Take 1 Tab by mouth daily.   ??? gabapentin (NEURONTIN) 300 mg capsule take 1 capsule by mouth every morning and 2 capsules by mouth at bedtime   ??? valsartan-hydroCHLOROthiazide (DIOVAN-HCT) 160-25 mg per tablet take 1 tablet by mouth once daily   ??? montelukast (SINGULAIR) 10 mg tablet TAKE 1 TABLET BY MOUTH AT BEDTIME IF NEEDED   ??? potassium chloride (KLOR-CON) 10 mEq tablet Take 1 Tab by mouth daily.   ??? atorvastatin (LIPITOR) 40 mg tablet take 1 tablet by mouth once daily   ??? levothyroxine (SYNTHROID) 150 mcg tablet TAKE 1 TABLET BY MOUTH ONCE DAILY BEFORE BREAKFAST   ??? latanoprost (XALATAN) 0.005 % ophthalmic solution    ??? multivitamin (ONE A DAY) tablet Take 1 Tab by mouth daily.   ??? Cholecalciferol, Vitamin D3, (VITAMIN D-3) 5,000 unit Tab Take  by mouth daily.     No current facility-administered medications for this visit.         Past Surgical History:   Procedure Laterality Date   ??? HX COLONOSCOPY  02-24-12    normal (Dr. Angelique Holm)   ??? HX HERNIA REPAIR      3x   ??? HX HYSTERECTOMY     ??? HX TUBAL LIGATION     ??? NEUROLOGICAL PROCEDURE UNLISTED  03-16-2009    s/p ACD & Fusion (Dr.P.Gurtner)   ??? PR COLONOSCOPY FLX DX W/COLLJ SPEC WHEN PFRMD  02-2007    +polyp(tubular adenoma); Dr Angelique Holm       Visit Vitals  BP 110/44 (BP 1 Location: Left arm, BP Patient Position: Sitting)   Pulse 86   Temp 97.9 ??F (36.6 ??C) (Oral)   Ht 5\' 2"  (1.575 m)   Wt 88.5 kg (195 lb)   SpO2 97%   BMI 35.67 kg/m??       Diagnostic Studies:  I have reviewed the relevant tests done on the  patient and show as follows  EKG tracings reviewed by me today.    No flowsheet data found.  1/18 Nuc Stress  Diagnosis:   1. Probably  normal scan.    2. Evidence of a small mild fixed inferior wall defect noted from his nuclear study suggestive of tissue attenuation with normal wall motion in the area.    3. No reversible defect suggesting ischemia noted from his nuclear study.   4. Normal left ventricular size and systolic function.   5. Low risk scan.     1/18 ECHO  SUMMARY:  Left ventricle: Systolic function was hyperdynamic. Ejection fraction was  estimated in the range of 70 % to 75 %. There were no regional wall motion  abnormalities. There was mild concentric hypertrophy. Doppler parameters  were consistent with abnormal left ventricular relaxation (grade 1  diastolic dysfunction).    Mitral valve: There was mild annular calcification.    Elaine [name] has a reminder for a "due or due soon" health maintenance. I have asked that she contact her primary care provider for follow-up on this health maintenance.    Physical Exam   Constitutional: She is oriented to person, place, and time. She appears well-developed and well-nourished. No distress.   sev obese   HENT:   Head: Normocephalic and atraumatic.   Mouth/Throat: Normal dentition.   Eyes: Right eye exhibits no discharge. Left eye exhibits no discharge. No scleral icterus.   Neck: Neck supple. No JVD present. Carotid bruit is not present. No thyromegaly present.   Cardiovascular: Normal rate, regular rhythm, S1 normal, S2 normal, normal heart sounds and intact distal pulses. Exam reveals no gallop and no friction rub.   No murmur heard.  Pulmonary/Chest: Effort normal and breath sounds normal. She has no wheezes. She has no rales.   Abdominal: Soft. She exhibits no mass. There is no tenderness.   Musculoskeletal:         General: No edema.   Lymphadenopathy:        Right cervical: No superficial cervical adenopathy present.       Left cervical: No superficial cervical adenopathy present.   Neurological: She is alert and oriented to person, place, and time.   Skin: Skin is warm and dry. No  rash noted.   Psychiatric: She has a normal mood and affect. Her behavior is normal.     Results for TONANTZIN, MIMNAUGH (MRN [id number]) as of 05/09/2018 15:04   Ref. Range 03/07/2018 17:10   Triglyceride Latest Ref Range: <150 MG/DL 115   Cholesterol, total Latest Ref Range: <200 MG/DL 161   HDL Cholesterol Latest Ref Range: 40 - 60 MG/DL 56   CHOL/HDL Ratio Latest Ref Range: 0 - 5.0   2.9   LDL, calculated Latest Ref Range: 0 - 100 MG/DL 82   VLDL, calculated Latest Units: MG/DL 23     ASSESSMENT and PLAN    HLD : Results for BIRDENA, KINGMA (MRN [id number]) as of 12/23/2018 14:05   Ref. Range 03/07/2018 17:10 03/23/2018 12:10 08/16/2018 16:58 10/10/2018 15:55   Triglyceride Latest Ref Range: <150 MG/DL 115   90   Cholesterol, total Latest Ref Range: <200 MG/DL 161   197   HDL Cholesterol Latest Ref Range: 40 - 60 MG/DL 56   62 (H)   CHOL/HDL Ratio Latest Ref Range: 0 - 5.0   2.9   3.2   VLDL, calculated Latest Units: MG/DL 23   18   LDL, calculated Latest Ref Range:  0 - 100 MG/DL 82   117 (H)   Hemoglobin A1c, (calculated) Latest Ref Range: 4.2 - 5.6 % 6.6 (H)   5.7 (H)     Stable CHF.  B/P is unusually low today .  Reduce Diovan HCT to half tablet daily.  She felt better on carvedilol but does not take it regularly.  Will reduce the dose to 3.125 but twice daily on a regular basis.  She will report if there is any significant increase in asthma.      Diagnoses and all orders for this visit:    1. Chronic diastolic congestive heart failure (HCC)  -     carvediloL (COREG) 3.125 mg tablet; Take 1 Tab by mouth two (2) times daily (with meals).    2. HTN (hypertension), benign  -     carvediloL (COREG) 3.125 mg tablet; Take 1 Tab by mouth two (2) times daily (with meals).  -     valsartan-hydroCHLOROthiazide (DIOVAN-HCT) 160-25 mg per tablet; Take 0.5 Tabs by mouth daily.    3. Pure hypercholesterolemia    4. Obesity (BMI 30-39.9)        Pertinent laboratory and test data reviewed and discussed with patient.  See patient  instructions also for other medical advice given    Medications Discontinued During This Encounter   Medication Reason   ??? diphenoxylate-atropine (LOMOTIL) 2.5-0.025 mg per tablet    ??? omeprazole (PRILOSEC) 40 mg capsule    ??? beclomethasone dipropionate (QVAR REDIHALER) 80 mcg/actuation HFAb inhaler    ??? carvediloL (COREG) 6.25 mg tablet Reorder   ??? valsartan-hydroCHLOROthiazide (DIOVAN-HCT) 160-25 mg per tablet        Follow-up and Dispositions    ?? Return in about 6 months (around 06/23/2019), or if symptoms worsen or fail to improve.

## 2018-12-23 NOTE — Progress Notes (Signed)
Patient brought medications list.    1. Have you been to the ER, urgent care clinic since your last visit?  Hospitalized since your last visit?     No    2. Have you seen or consulted any other health care providers outside of the [facility] since your last visit?  Include any pap smears or colon screening.      No     3.  Since your last visit, have you had any of the following symptoms?      chest pains and shortness of breath.       4.  Have you had any blood work, X-rays or cardiac testing?      No    5.  Where do you normally have your labs drawn?   Yes    6. Do you need any refills today?   No

## 2018-12-23 NOTE — Patient Instructions (Signed)
Medications Discontinued During This Encounter   Medication Reason   ??? diphenoxylate-atropine (LOMOTIL) 2.5-0.025 mg per tablet    ??? omeprazole (PRILOSEC) 40 mg capsule    ??? beclomethasone dipropionate (QVAR REDIHALER) 80 mcg/actuation HFAb inhaler           Avoiding Triggers With Heart Failure: Care Instructions  Your Care Instructions    Triggers are anything that make your heart failure flare up. A flare-up is also called "sudden heart failure" or "acute heart failure." When you have a flare-up, fluid builds up in your lungs, and you have problems breathing. You might need to go to the hospital. By watching for changes in your condition and avoiding triggers, you can prevent heart failure flare-ups.  Follow-up care is a key part of your treatment and safety. Be sure to make and go to all appointments, and call your doctor if you are having problems. It's also a good idea to know your test results and keep a list of the medicines you take.  How can you care for yourself at home?  Watch for changes in your weight and condition  ?? Weigh yourself without clothing at the same time each day. Record your weight. Call your doctor if you have sudden weight gain, such as more than 2 to 3 pounds in a day or 5 pounds in a week. (Your doctor may suggest a different range of weight gain.) A sudden weight gain may mean that your heart failure is getting worse.  ?? Keep a daily record of your symptoms. Write down any changes in how you feel, such as new shortness of breath, cough, or problems eating. Also record if your ankles are more swollen than usual and if you feel more tired than usual. Note anything that you ate or did that could have triggered these changes.  Limit sodium  Sodium causes your body to hold on to extra water. This may cause your heart failure symptoms to get worse. People get most of their sodium from processed foods. Fast food and restaurant meals also tend to be very high in sodium.   ?? Your doctor may suggest that you limit sodium to 2,000 milligrams (mg) a day or less. That is less than 1 teaspoon of salt a day, including all the salt you eat in cooking or in packaged foods.  ?? Read food labels on cans and food packages. They tell you how much sodium you get in one serving. Check the serving size. If you eat more than one serving, you are getting more sodium.  ?? Be aware that sodium can come in forms other than salt, including monosodium glutamate (MSG), sodium citrate, and sodium bicarbonate (baking soda). MSG is often added to Asian food. You can sometimes ask for food without MSG or salt.  ?? Slowly reducing salt will help you adjust to the taste. Take the salt shaker off the table.  ?? Flavor your food with garlic, lemon juice, onion, vinegar, herbs, and spices instead of salt. Do not use soy sauce, steak sauce, onion salt, garlic salt, mustard, or ketchup on your food, unless it is labeled "low-sodium" or "low-salt."  ?? Make your own salad dressings, sauces, and ketchup without adding salt.  ?? Use fresh or frozen ingredients, instead of canned ones, whenever you can. Choose low-sodium canned goods.  ?? Eat less processed food and food from restaurants, including fast food.  Exercise as directed  Moderate, regular exercise is very good for your heart. It improves your  blood flow and helps control your weight. But too much exercise can stress your heart and cause a heart failure flare-up.  ?? Check with your doctor before you start an exercise program.  ?? Walking is an easy way to get exercise. Start out slowly. Gradually increase the length and pace of your walk. Swimming, riding a bike, and using a treadmill are also good forms of exercise.  ?? When you exercise, watch for signs that your heart is working too hard. You are pushing yourself too hard if you cannot talk while you are exercising. If you become short of breath or dizzy or have chest pain, stop, sit down, and rest.   ?? Do not exercise when you do not feel well.  Take medicines correctly  ?? Take your medicines exactly as prescribed. Call your doctor if you think you are having a problem with your medicine.  ?? Make a list of all the medicines you take. Include those prescribed to you by other doctors and any over-the-counter medicines, vitamins, or supplements you take. Take this list with you when you go to any doctor.  ?? Take your medicines at the same time every day. It may help you to post a list of all the medicines you take every day and what time of day you take them.  ?? Make taking your medicine as simple as you can. Plan times to take your medicines when you are doing other things, such as eating a meal or getting ready for bed. This will make it easier to remember to take your medicines.  ?? Get organized. Use helpful tools, such as daily or weekly pill containers.  When should you call for help?  Call 911 if you have symptoms of sudden heart failure such as:  ?? ?? You have severe trouble breathing.   ?? ?? You cough up pink, foamy mucus.   ?? ?? You have a new irregular or rapid heartbeat.   ??Call your doctor now or seek immediate medical care if:  ?? ?? You have new or increased shortness of breath.   ?? ?? You are dizzy or lightheaded, or you feel like you may faint.   ?? ?? You have sudden weight gain, such as more than 2 to 3 pounds in a day or 5 pounds in a week. (Your doctor may suggest a different range of weight gain.)   ?? ?? You have increased swelling in your legs, ankles, or feet.   ?? ?? You are suddenly so tired or weak that you cannot do your usual activities.   ??Watch closely for changes in your health, and be sure to contact your doctor if you develop new symptoms.  Where can you learn more?  Go to [employer].  Enter [phone] in the search box to learn more about "Avoiding Triggers With Heart Failure: Care Instructions."  Current as of: March 15, 2018  Content Version: 12.2   ?? 2006-2019 [employer]. Care instructions adapted under license by [employer] (which disclaims liability or warranty for this information). If you have questions about a medical condition or this instruction, always ask your healthcare professional. Miller any warranty or liability for your use of this information.

## 2018-12-23 NOTE — Progress Notes (Signed)
Patient brought medications list.    1. Have you been to the ER, urgent care clinic since your last visit?  Hospitalized since your last visit?     No    2. Have you seen or consulted any other health care providers outside of the [facility] since your last visit?  Include any pap smears or colon screening.      No     3.  Since your last visit, have you had any of the following symptoms?      chest pains and shortness of breath.       4.  Have you had any blood work, X-rays or cardiac testing?      No    5.  Where do you normally have your labs drawn?   Yes    6. Do you need any refills today?   No

## 2018-12-23 NOTE — Progress Notes (Signed)
HISTORY OF PRESENT ILLNESS  Elaine [name] is a 68 y.o. female.    05/2018 - Mild SOB today related to asthma - improved with inhaler use.    Hypertension   The history is provided by the medical records. This is a chronic problem. Associated symptoms include shortness of breath.   Cholesterol Problem   The history is provided by the medical records. This is a chronic problem. Associated symptoms include shortness of breath.   Shortness of Breath   The history is provided by the patient. This is a new problem. The problem occurs intermittently.The current episode started more than 1 week ago (9/17). The problem has not changed since onset.Pertinent negatives include no fever, no cough, no wheezing, no PND, no orthopnea, no vomiting, no rash, no leg swelling and no claudication. The problem's precipitants include exercise (steps).   CHF   Associated symptoms include shortness of breath.       Review of Systems   Constitutional: Negative for chills, fever, malaise/fatigue and weight loss.   HENT: Negative for nosebleeds.    Eyes: Negative for discharge.   Respiratory: Positive for shortness of breath. Negative for cough and wheezing.    Cardiovascular: Negative for palpitations, orthopnea, claudication, leg swelling and PND.   Gastrointestinal: Negative for diarrhea, nausea and vomiting.   Genitourinary: Negative for dysuria and hematuria.   Musculoskeletal: Negative for joint pain.   Skin: Negative for rash.   Neurological: Negative for dizziness, seizures and loss of consciousness.   Endo/Heme/Allergies: Negative for polydipsia. Does not bruise/bleed easily.   Psychiatric/Behavioral: Negative for depression and substance abuse. The patient does not have insomnia.      Allergies   Allergen Reactions   ??? Asa-Acetaminophen-Caff-Potass Other (comments)     Bleeding in stomach   ??? Benadryl [Diphenhydramine Hcl] Other (comments)     Muscle jerking   ??? Other Plant, Animal, Environmental Not Reported This Time      Grass, Dust and Mold   ??? Pollen Extracts Not Reported This Time       Past Medical History:   Diagnosis Date   ??? Anemia    ??? Asthma    ??? Colon polyp    ??? DDD (degenerative disc disease) 02/15/2009   ??? Depression 12/18/2011   ??? Essential hypertension, benign    ??? History of echocardiogram 03/20/2004    EF 70%.  Borderline DDfx.  No significant valvular pathology.   ??? Hypercholesterolemia    ??? Hypertension    ??? Iron deficiency anemia 6/44/0347   ??? 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 Lower extremity venous duplex 10/23/2009    Left leg:  No DVT.     ??? Meralgia paraesthetica 10/22/2013   ??? OA (osteoarthritis of spine) 02/15/2009   ??? OA (osteoarthritis of spine) 02/15/2009   ??? Obesity, unspecified    ??? Other and unspecified hyperlipidemia    ??? Pre-operative cardiovascular examination     For spine surgery   ??? Right sided sciatica    ??? Shortness of breath     Possible asthma, HCVD; less likely CAD (Noted 03/15/09)   ??? Sleep apnea 02/15/2009    uses cpap machine   ??? Thallium stress test  03/20/2004    Partially transient, mod basal & mid anterior defect most c/w artifact; mild anterior ischemia less likely.  Neg EKG on pharm stress test.   ??? Thyroid disease     hypothyroidism       Family History   Problem Relation  Age of Onset   ??? Cancer Mother    ??? Hypertension Mother    ??? Hypertension Sister         x3   ??? Heart Surgery Sister    ??? Heart Disease Sister    ??? Heart Attack Sister    ??? Cancer Maternal Aunt         breast   ??? Breast Cancer Maternal Aunt    ??? Glaucoma Son    ??? Heart defect Son    ??? Diabetes Paternal Uncle    ??? Hypertension Paternal Uncle        Social History     Tobacco Use   ??? Smoking status: Former Smoker     Packs/day: 1.00     Years: 28.00     Pack years: 28.00     Last attempt to quit: 04/25/2004     Years since quitting: 14.6   ??? Smokeless tobacco: Never Used   Substance Use Topics   ??? Alcohol use: No   ??? Drug use: No        Current Outpatient Medications   Medication Sig    ??? b complex vitamins tablet Take 1 Tab by mouth daily.   ??? carvediloL (COREG) 6.25 mg tablet Take 6.25 mg by mouth as needed.   ??? benzonatate (TESSALON) 100 mg capsule Take 1 Cap by mouth three (3) times daily as needed for Cough for up to 7 days.   ??? azithromycin (ZITHROMAX) 250 mg tablet Take 2 tablets today, then take 1 tablet daily   ??? terbinafine HCl (LAMISIL) 250 mg tablet Take 1 Tab by mouth daily.   ??? gabapentin (NEURONTIN) 300 mg capsule take 1 capsule by mouth every morning and 2 capsules by mouth at bedtime   ??? valsartan-hydroCHLOROthiazide (DIOVAN-HCT) 160-25 mg per tablet take 1 tablet by mouth once daily   ??? montelukast (SINGULAIR) 10 mg tablet TAKE 1 TABLET BY MOUTH AT BEDTIME IF NEEDED   ??? potassium chloride (KLOR-CON) 10 mEq tablet Take 1 Tab by mouth daily.   ??? atorvastatin (LIPITOR) 40 mg tablet take 1 tablet by mouth once daily   ??? levothyroxine (SYNTHROID) 150 mcg tablet TAKE 1 TABLET BY MOUTH ONCE DAILY BEFORE BREAKFAST   ??? latanoprost (XALATAN) 0.005 % ophthalmic solution    ??? multivitamin (ONE A DAY) tablet Take 1 Tab by mouth daily.   ??? Cholecalciferol, Vitamin D3, (VITAMIN D-3) 5,000 unit Tab Take  by mouth daily.     No current facility-administered medications for this visit.         Past Surgical History:   Procedure Laterality Date   ??? HX COLONOSCOPY  02-24-12    normal (Dr. Angelique Holm)   ??? HX HERNIA REPAIR      3x   ??? HX HYSTERECTOMY     ??? HX TUBAL LIGATION     ??? NEUROLOGICAL PROCEDURE UNLISTED  03-16-2009    s/p ACD & Fusion (Dr.P.Gurtner)   ??? PR COLONOSCOPY FLX DX W/COLLJ SPEC WHEN PFRMD  02-2007    +polyp(tubular adenoma); Dr Angelique Holm       Visit Vitals  BP 110/44 (BP 1 Location: Left arm, BP Patient Position: Sitting)   Pulse 86   Temp 97.9 ??F (36.6 ??C) (Oral)   Ht 5\' 2"  (1.575 m)   Wt 88.5 kg (195 lb)   SpO2 97%   BMI 35.67 kg/m??       Diagnostic Studies:  I have reviewed the relevant tests done on the  patient and show as follows  EKG tracings reviewed by me today.     No flowsheet data found.  1/18 Nuc Stress  Diagnosis:   1. Probably normal scan.    2. Evidence of a small mild fixed inferior wall defect noted from his nuclear study suggestive of tissue attenuation with normal wall motion in the area.    3. No reversible defect suggesting ischemia noted from his nuclear study.   4. Normal left ventricular size and systolic function.   5. Low risk scan.     1/18 ECHO  SUMMARY:  Left ventricle: Systolic function was hyperdynamic. Ejection fraction was  estimated in the range of 70 % to 75 %. There were no regional wall motion  abnormalities. There was mild concentric hypertrophy. Doppler parameters  were consistent with abnormal left ventricular relaxation (grade 1  diastolic dysfunction).    Mitral valve: There was mild annular calcification.    Ms. Armendarez has a reminder for a "due or due soon" health maintenance. I have asked that she contact her primary care provider for follow-up on this health maintenance.    Physical Exam   Constitutional: She is oriented to person, place, and time. She appears well-developed and well-nourished. No distress.   sev obese   HENT:   Head: Normocephalic and atraumatic.   Mouth/Throat: Normal dentition.   Eyes: Right eye exhibits no discharge. Left eye exhibits no discharge. No scleral icterus.   Neck: Neck supple. No JVD present. Carotid bruit is not present. No thyromegaly present.   Cardiovascular: Normal rate, regular rhythm, S1 normal, S2 normal, normal heart sounds and intact distal pulses. Exam reveals no gallop and no friction rub.   No murmur heard.  Pulmonary/Chest: Effort normal and breath sounds normal. She has no wheezes. She has no rales.   Abdominal: Soft. She exhibits no mass. There is no tenderness.   Musculoskeletal:         General: No edema.   Lymphadenopathy:        Right cervical: No superficial cervical adenopathy present.       Left cervical: No superficial cervical adenopathy present.    Neurological: She is alert and oriented to person, place, and time.   Skin: Skin is warm and dry. No rash noted.   Psychiatric: She has a normal mood and affect. Her behavior is normal.     Results for JOSLYNE, MARSHBURN (MRN [id number]) as of 05/09/2018 15:04   Ref. Range 03/07/2018 17:10   Triglyceride Latest Ref Range: <150 MG/DL 115   Cholesterol, total Latest Ref Range: <200 MG/DL 161   HDL Cholesterol Latest Ref Range: 40 - 60 MG/DL 56   CHOL/HDL Ratio Latest Ref Range: 0 - 5.0   2.9   LDL, calculated Latest Ref Range: 0 - 100 MG/DL 82   VLDL, calculated Latest Units: MG/DL 23     ASSESSMENT and PLAN    HLD : Results for TENSLEY, WERY (MRN [id number]) as of 12/23/2018 14:05   Ref. Range 03/07/2018 17:10 03/23/2018 12:10 08/16/2018 16:58 10/10/2018 15:55   Triglyceride Latest Ref Range: <150 MG/DL 115   90   Cholesterol, total Latest Ref Range: <200 MG/DL 161   197   HDL Cholesterol Latest Ref Range: 40 - 60 MG/DL 56   62 (H)   CHOL/HDL Ratio Latest Ref Range: 0 - 5.0   2.9   3.2   VLDL, calculated Latest Units: MG/DL 23   18   LDL, calculated Latest Ref Range:  0 - 100 MG/DL 82   117 (H)   Hemoglobin A1c, (calculated) Latest Ref Range: 4.2 - 5.6 % 6.6 (H)   5.7 (H)     Stable CHF.  B/P is unusually low today .  Reduce Diovan HCT to half tablet daily.  She felt better on carvedilol but does not take it regularly.  Will reduce the dose to 3.125 but twice daily on a regular basis.  She will report if there is any significant increase in asthma.      Diagnoses and all orders for this visit:    1. Chronic diastolic congestive heart failure (HCC)  -     carvediloL (COREG) 3.125 mg tablet; Take 1 Tab by mouth two (2) times daily (with meals).    2. HTN (hypertension), benign  -     carvediloL (COREG) 3.125 mg tablet; Take 1 Tab by mouth two (2) times daily (with meals).  -     valsartan-hydroCHLOROthiazide (DIOVAN-HCT) 160-25 mg per tablet; Take 0.5 Tabs by mouth daily.    3. Pure hypercholesterolemia     4. Obesity (BMI 30-39.9)        Pertinent laboratory and test data reviewed and discussed with patient.  See patient instructions also for other medical advice given    Medications Discontinued During This Encounter   Medication Reason   ??? diphenoxylate-atropine (LOMOTIL) 2.5-0.025 mg per tablet    ??? omeprazole (PRILOSEC) 40 mg capsule    ??? beclomethasone dipropionate (QVAR REDIHALER) 80 mcg/actuation HFAb inhaler    ??? carvediloL (COREG) 6.25 mg tablet Reorder   ??? valsartan-hydroCHLOROthiazide (DIOVAN-HCT) 160-25 mg per tablet        Follow-up and Dispositions    ?? Return in about 6 months (around 06/23/2019), or if symptoms worsen or fail to improve.

## 2018-12-29 ENCOUNTER — Encounter

## 2018-12-29 MED ORDER — ATORVASTATIN 40 MG TAB
40 mg | ORAL_TABLET | ORAL | 5 refills | Status: DC
Start: 2018-12-29 — End: 2019-07-12

## 2019-01-17 IMAGING — DX DG CHEST 2V
2 series · 2 of 2 positions shown · non-contrast
Comparison: 12/01/2013

CLINICAL DATA: Chest and epigastric pain tonight.

EXAM:
CHEST  2 VIEW

[chest pa]
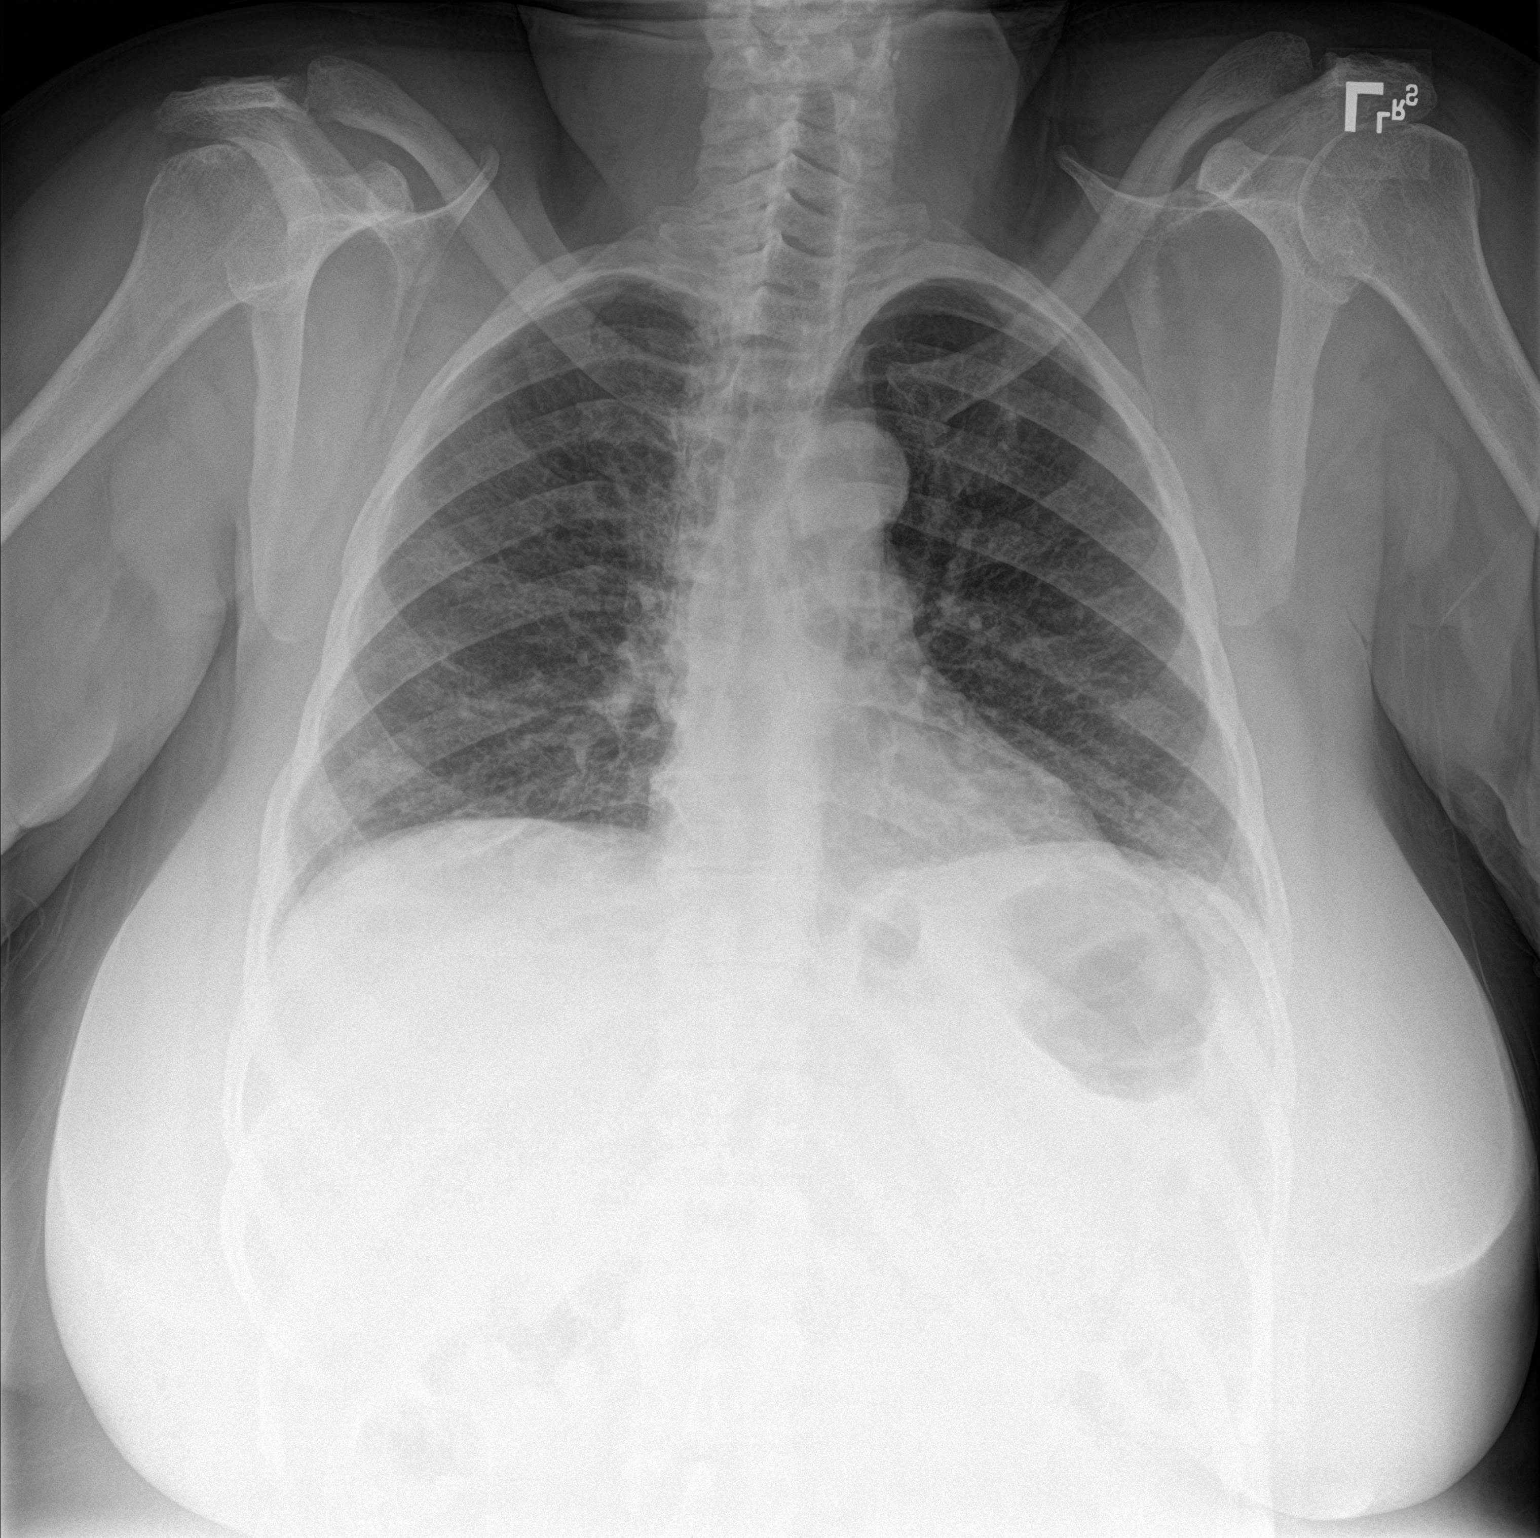

[chest lat]
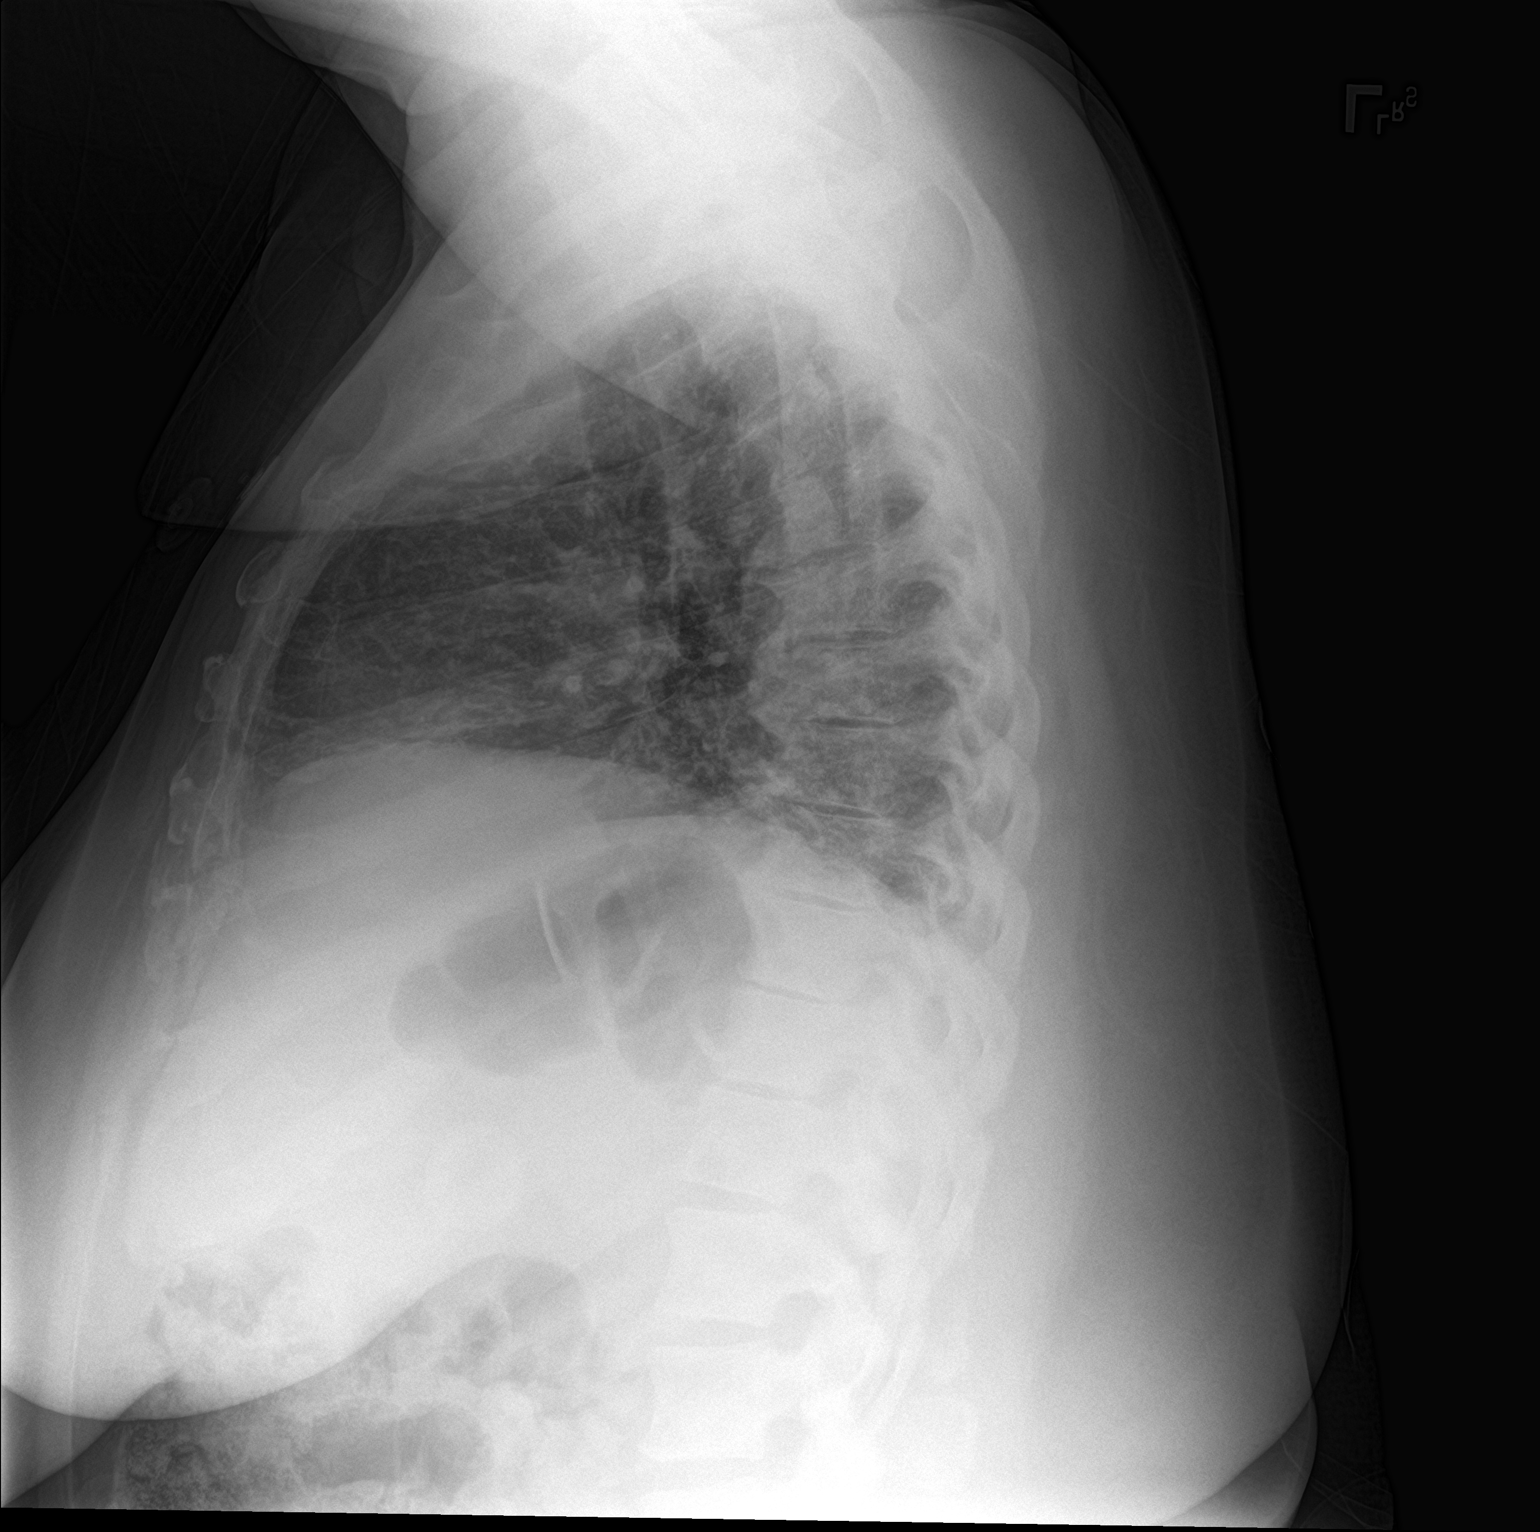

[2 of 2 positions shown; findings below may reference images not displayed]

FINDINGS: Normal heart size and mediastinal contours. Mild interstitial and
bronchial thickening. Low lung volumes with mild basilar
atelectasis. No confluent consolidation. No pleural fluid or
pneumothorax. No acute osseous abnormalities.
IMPRESSION: Mild interstitial and bronchial thickening which may be due to
bronchial inflammation, atypical infection, or less likely pulmonary
edema.

## 2019-01-23 MED ORDER — MONTELUKAST 10 MG TAB
10 mg | ORAL_TABLET | ORAL | 1 refills | Status: DC
Start: 2019-01-23 — End: 2019-04-14

## 2019-01-27 MED ORDER — LEVOTHYROXINE 150 MCG TAB
150 mcg | ORAL_TABLET | ORAL | 1 refills | Status: DC
Start: 2019-01-27 — End: 2019-05-08

## 2019-01-30 ENCOUNTER — Encounter: Attending: Neurology | Primary: Internal Medicine

## 2019-02-02 ENCOUNTER — Ambulatory Visit: Admit: 2019-02-02 | Discharge: 2019-02-02 | Payer: MEDICARE | Attending: Internal Medicine | Primary: Internal Medicine

## 2019-02-02 ENCOUNTER — Ambulatory Visit: Attending: Internal Medicine | Primary: Internal Medicine

## 2019-02-02 DIAGNOSIS — J019 Acute sinusitis, unspecified: Secondary | ICD-10-CM

## 2019-02-02 MED ORDER — AZITHROMYCIN 250 MG TAB
250 mg | ORAL_TABLET | ORAL | 0 refills | Status: AC
Start: 2019-02-02 — End: 2019-02-07

## 2019-02-02 MED ORDER — PANTOPRAZOLE 40 MG TAB, DELAYED RELEASE
40 mg | ORAL_TABLET | Freq: Every day | ORAL | 2 refills | Status: DC
Start: 2019-02-02 — End: 2019-06-23

## 2019-02-02 NOTE — Progress Notes (Signed)
Patient is in the office today for chest pain due to coughing.    1. Have you been to the ER, urgent care clinic since your last visit?  Hospitalized since your last visit?No    2. Have you seen or consulted any other health care providers outside of the [facility] since your last visit?  Include any pap smears or colon screening. No

## 2019-02-02 NOTE — Progress Notes (Signed)
HISTORY OF PRESENT ILLNESS  Elaine [name] is a 68 y.o. female.  Cold Symptoms   The history is provided by the patient. This is a new problem. The current episode started more than 1 week ago. The problem occurs constantly. The cough is productive of purulent sputum. There has been no fever. Associated symptoms include chest pain, rhinorrhea and wheezing. Pertinent negatives include no chills and no sweats. Treatments tried: Qvar. The treatment provided mild relief. She is not a smoker.       Review of Systems   Constitutional: Negative for chills.   HENT: Positive for rhinorrhea.    Respiratory: Positive for wheezing.    Cardiovascular: Positive for chest pain.   Gastrointestinal: Positive for heartburn and melena.       Physical Exam  Constitutional:       Appearance: She is obese.   HENT:      Right Ear: Tympanic membrane and ear canal normal.      Left Ear: Tympanic membrane and ear canal normal.      Nose: Congestion present.      Mouth/Throat:      Pharynx: Oropharynx is clear.   Cardiovascular:      Rate and Rhythm: Normal rate and regular rhythm.   Pulmonary:      Effort: Pulmonary effort is normal.      Breath sounds: Normal breath sounds.         ASSESSMENT and PLAN    ICD-10-CM ICD-9-CM    1. Acute non-recurrent sinusitis, unspecified location J01.90 461.9 azithromycin (ZITHROMAX) 250 mg tablet   2. Melena K92.1 578.1 REFERRAL TO GASTROENTEROLOGY   3. Gastroesophageal reflux disease with esophagitis K21.0 530.11 pantoprazole (PROTONIX) 40 mg tablet     Reviewed plan of care. Patient has provided input and agrees with goals.

## 2019-02-02 NOTE — Progress Notes (Signed)
Patient is in the office today for chest pain due to coughing.    1. Have you been to the ER, urgent care clinic since your last visit?  Hospitalized since your last visit?No    2. Have you seen or consulted any other health care providers outside of the [place] since your last visit?  Include any pap smears or colon screening. No

## 2019-02-02 NOTE — Patient Instructions (Signed)
Cough: Care Instructions  Your Care Instructions    A cough is your body's response to something that bothers your throat or airways. Many things can cause a cough. You might cough because of a cold or the flu, bronchitis, or asthma. Smoking, postnasal drip, allergies, and stomach acid that backs up into your throat also can cause coughs.  A cough is a symptom, not a disease. Most coughs stop when the cause, such as a cold, goes away. You can take a few steps at home to cough less and feel better.  Follow-up care is a key part of your treatment and safety. Be sure to make and go to all appointments, and call your doctor if you are having problems. It's also a good idea to know your test results and keep a list of the medicines you take.  How can you care for yourself at home?  ?? Drink lots of water and other fluids. This helps thin the mucus and soothes a dry or sore throat. Honey or lemon juice in hot water or tea may ease a dry cough.  ?? Take cough medicine as directed by your doctor.  ?? Prop up your head on pillows to help you breathe and ease a dry cough.  ?? Try cough drops to soothe a dry or sore throat. Cough drops don't stop a cough. Medicine-flavored cough drops are no better than candy-flavored drops or hard candy.  ?? Do not smoke. Avoid secondhand smoke. If you need help quitting, talk to your doctor about stop-smoking programs and medicines. These can increase your chances of quitting for good.  When should you call for help?  Call 911 anytime you think you may need emergency care. For example, call if:  ?? ?? You have severe trouble breathing.   ??Call your doctor now or seek immediate medical care if:  ?? ?? You cough up blood.   ?? ?? You have new or worse trouble breathing.   ?? ?? You have a new or higher fever.   ?? ?? You have a new rash.   ??Watch closely for changes in your health, and be sure to contact your doctor if:  ?? ?? You cough more deeply or more often, especially if you notice more  mucus or a change in the color of your mucus.   ?? ?? You have new symptoms, such as a sore throat, an earache, or sinus pain.   ?? ?? You do not get better as expected.   Where can you learn more?  Go to http://www.healthwise.net/[employer].  Enter D279 in the search box to learn more about "Cough: Care Instructions."  Current as of: May 15, 2018  Content Version: 12.2  ?? 2006-2019 [employer], [employer]. Care instructions adapted under license by [employer] (which disclaims liability or warranty for this information). If you have questions about a medical condition or this instruction, always ask your healthcare professional. [employer] disclaims any warranty or liability for your use of this information.

## 2019-02-20 ENCOUNTER — Ambulatory Visit: Admit: 2019-02-20 | Discharge: 2019-02-20 | Payer: MEDICARE | Attending: Pulmonary Disease | Primary: Internal Medicine

## 2019-02-20 ENCOUNTER — Ambulatory Visit: Attending: Pulmonary Disease | Primary: Internal Medicine

## 2019-02-20 DIAGNOSIS — G4733 Obstructive sleep apnea (adult) (pediatric): Secondary | ICD-10-CM

## 2019-02-20 DIAGNOSIS — M109 Gout, unspecified: Secondary | ICD-10-CM | POA: Diagnosis not present

## 2019-02-20 DIAGNOSIS — E1169 Type 2 diabetes mellitus with other specified complication: Secondary | ICD-10-CM | POA: Diagnosis not present

## 2019-02-20 DIAGNOSIS — E785 Hyperlipidemia, unspecified: Secondary | ICD-10-CM | POA: Diagnosis not present

## 2019-02-20 DIAGNOSIS — K219 Gastro-esophageal reflux disease without esophagitis: Secondary | ICD-10-CM | POA: Diagnosis not present

## 2019-02-20 DIAGNOSIS — Z1211 Encounter for screening for malignant neoplasm of colon: Secondary | ICD-10-CM | POA: Diagnosis not present

## 2019-02-20 DIAGNOSIS — Z6839 Body mass index (BMI) 39.0-39.9, adult: Secondary | ICD-10-CM | POA: Diagnosis not present

## 2019-02-20 DIAGNOSIS — Z7984 Long term (current) use of oral hypoglycemic drugs: Secondary | ICD-10-CM | POA: Diagnosis not present

## 2019-02-20 DIAGNOSIS — I10 Essential (primary) hypertension: Secondary | ICD-10-CM | POA: Diagnosis not present

## 2019-02-20 NOTE — Progress Notes (Signed)
Progress Notes by Melissa Montane, DO at 02/20/19 1430                Author: Melissa Montane, DO  Service: --  Author Type: Physician       Filed: 02/23/19 1308  Encounter Date: 02/20/2019  Status: Signed          Editor: Melissa Montane, DO (Physician)                    [facility]   Pulmonary, Critical Care, and Sleep Medicine      Office Progress Note- Initial Evaluation         Primary Care Physician: Thana Farr, MD        Reason for Visit:  Evaluation for possible sleep disorder        Assessment:   1.  Snoring   2.  Excessive Daytime Sleepiness (EDS)   3.  H/O OSA- no recent PAP therapy.    4.  Nocturia- gets up to use the bathroom 3-5 times nightly   5.  Asthma   6.  Seasonal Allergies   7.  Obesity: Body mass index is 34.46 kg/m??.   8.               Plan:      ??  Schedule patient for home sleep study for further evaluation.   ??  Potential consequences of untreated sleep apnea, and/or excessive daytime sleepiness were discussed with the patient.   ??  Scientist, clinical (histocompatibility and immunogenetics) provided.   ??  Treatment options including CPAP, dental appliance, weight reduction measures, positional therapy, surgeries etc were discussed.   ??  Healthy lifestyle changes to include weight loss and exercise discussed.   ??  Healthy sleep habits were reviewed and encouraged.   ??  Driver and workplace safety reviewed and discussed as appropriate. Drowsy and/or inattentive driving should be avoided.   ??  Follow-up in after sleep testing , sooner should new symptoms or problems arise.   ??  Follow up with Primary Care Provider (PCP) as directed and for routine health care maintenance.              History of Present Illness: MS Shaeleigh A Eble is a  68 y.o. female patient who presents for evaluation for possible sleep breathing disorder.  The history was provided by the patient.      The patient reports that she had a sleep study more than 10 years ago.No recent PAP therapy use      Occupation:     Building control surveyor                      Work Schedule: [place] work: No      Driving:  yes   Drowsy Driving: is reported.       Motor vehicle accident(s) associated with drowsy driving have not occurred.       Snoring:   This is a Chronic problem which has been ongoing for years. Witnessed apneas  are not reported.      Fatigue:    This is a Chronic problem which has been ongoing for years.      Dental: Teeth clenching or grinding ist reported.      Naps: are reported- not always  refreshing.      Leg Symptoms/Pain: She  does not have unpleasant or crawling sensation in legs or strong urge to move  when inactive.       GERD: is not reported.        Mood: Happy.      Sleep-Wake History:       Estimates sleeping approximately 6 hours per night/day.      Reports sleeping in a Bed with 2 pillows under their head.      He gets into bed at approximately 23-2400.  Once in bed, she usually goes to sleep.  It usually takes up to 15 minutes to fall asleep after going  to bed.       Reports waking up to use the bathroom 3-5 times nightly.      Pain, typically does not disturb their sleep.      Vivid dreams are not reported.      She normally awakens without an alarm to start their day at 0400-0500.  She  typically gets out of bed 0500 .      Waking up with a morning headache is reported.      Awakening with a dry mouth is reported.      Symptom(s) suggestive of cataplexy is not  reported.      Sleep paralysis is reported.- 2 times total. Last event distant past.      Hypnagogic and/or hypnopompic hallucinations are not reported.      Sleep walking is not  reported.        Sleep talking is not  reported.      Other unusual and/or parasomnia behaviors are not reported.      Family Sleep History:   - Nephew:  Reportedly died from sleep Apnea              Stop [place] Lighter  02/20/2019        Does the patient snore loudly (louder than talking or loud enough to be heard through closed doors)?  1     Does the patient often feel  tired, fatigued, or sleepy during the daytime, even after a "good" night's sleep?  1     Has anyone ever observed the patient stop breathing during their sleep?   0     Does the patient have or are they being treated for high blood pressure?  1     Is the patient's BMI greater than 35?  0     Is your neck circumference greater than 17 inches (Female) or 16 inches (Female)?  0     Is the patient older than 83?  1     Is the patient female?  0        OSA Score  4              3 most recent PHQ Screens  10/14/2018        Little interest or pleasure in doing things  Not at all     Feeling down, depressed, irritable, or hopeless  Not at all        Total Score PHQ 2  0              Epworth Scale  02/20/2019        Sitting and Reading  3     Watching TV  3     Sitting, inactive in a public place (e.g. a movie theater or meeting)  3     As a passenger in a car for an hour, without a break  3     Lying down to rest in the afternoon, when  circumstances permit  3     Sitting and talking to someone  1     Sitting quietly after lunch without alcohol  2     In a car, while stopped for a few minutes in traffic  1        Epworth Sleepiness Score  19            Neck circ. in "inches": 14             Past Medical History:     Past Medical History:        Diagnosis  Date         ?  Anemia       ?  Asthma       ?  Colon polyp       ?  DDD (degenerative disc disease)  02/15/2009     ?  Depression  12/18/2011     ?  Essential hypertension, benign       ?  History of echocardiogram  03/20/2004          EF 70%.  Borderline DDfx.  No significant valvular pathology.         ?  Hypercholesterolemia       ?  Hypertension       ?  Iron deficiency anemia  02/15/2009     ?  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  Lower extremity venous duplex  10/23/2009          Left leg:  No DVT.           ?  Meralgia paraesthetica  10/22/2013     ?  OA (osteoarthritis of spine)  02/15/2009     ?  OA (osteoarthritis of spine)  02/15/2009     ?  Obesity, unspecified       ?  Other  and unspecified hyperlipidemia       ?  Pre-operative cardiovascular examination            For spine surgery         ?  Right sided sciatica       ?  Shortness of breath            Possible asthma, HCVD; less likely CAD (Noted 03/15/09)         ?  Sleep apnea  02/15/2009          uses cpap machine         ?  Thallium stress test   03/20/2004          Partially transient, mod basal & mid anterior defect most c/w artifact; mild anterior ischemia less likely.  Neg EKG on pharm stress test.         ?  Thyroid disease            hypothyroidism           Past Surgical History:     Past Surgical History:         Procedure  Laterality  Date          ?  HX COLONOSCOPY    02-24-12          normal (Dr. Angelique Holm)          ?  HX HERNIA REPAIR              3x          ?  HX HYSTERECTOMY         ?  HX TUBAL LIGATION         ?  NEUROLOGICAL PROCEDURE UNLISTED    03-16-2009          s/p ACD & Fusion (Dr.P.Gurtner)          ?  PR COLONOSCOPY FLX DX W/COLLJ SPEC WHEN PFRMD    02-2007          +polyp(tubular adenoma); Dr Angelique Holm           Family History:     Family History         Problem  Relation  Age of Onset          ?  Cancer  Mother       ?  Hypertension  Mother       ?  Hypertension  Sister                x3          ?  Heart Surgery  Sister       ?  Heart Disease  Sister       ?  Heart Attack  Sister       ?  Cancer  Maternal Aunt                breast          ?  Breast Cancer  Maternal Aunt       ?  Glaucoma  Son       ?  Heart defect  Son       ?  Diabetes  Paternal Uncle            ?  Hypertension  Paternal Uncle             Social History:     Social History          Tobacco Use         ?  Smoking status:  Former Smoker              Packs/day:  1.00         Years:  28.00         Pack years:  28.00         Last attempt to quit:  04/25/2004         Years since quitting:  14.8         ?  Smokeless tobacco:  Never Used       Substance Use Topics         ?  Alcohol use:  No         ?  Drug use:  No                 Caffeine  Amount  Time  of last Intake  Comments          Coffee  Several/day  evening       Soda  2/day    Diet Pepsi     Tea  Rare              Energy Drinks  None              Over- the - counter stimulant pills  None              Other Substances                Alcohol  None         Tobacco  None  Drugs  None              Other:  None               Medications:     Current Outpatient Medications on File Prior to Visit          Medication  Sig  Dispense  Refill           ?  levothyroxine (SYNTHROID) 150 mcg tablet  take 1 tablet by mouth once daily before breakfast  90 Tab  1     ?  montelukast (SINGULAIR) 10 mg tablet  take 1 tablet by mouth at bedtime if needed  90 Tab  1     ?  atorvastatin (LIPITOR) 40 mg tablet  take 1 tablet by mouth once daily  30 Tab  5     ?  valsartan-hydroCHLOROthiazide (DIOVAN-HCT) 160-25 mg per tablet  Take 0.5 Tabs by mouth daily.  90 Tab  3     ?  terbinafine HCl (LAMISIL) 250 mg tablet  Take 1 Tab by mouth daily.  90 Tab  0     ?  gabapentin (NEURONTIN) 300 mg capsule  take 1 capsule by mouth every morning and 2 capsules by mouth at bedtime  180 Cap  1     ?  potassium chloride (KLOR-CON) 10 mEq tablet  Take 1 Tab by mouth daily.  90 Tab  3     ?  latanoprost (XALATAN) 0.005 % ophthalmic solution           ?  multivitamin (ONE A DAY) tablet  Take 1 Tab by mouth daily.         ?  Cholecalciferol, Vitamin D3, (VITAMIN D-3) 5,000 unit Tab  Take  by mouth daily.         ?  pantoprazole (PROTONIX) 40 mg tablet  Take 1 Tab by mouth daily.  30 Tab  2     ?  b complex vitamins tablet  Take 1 Tab by mouth daily.               ?  carvediloL (COREG) 3.125 mg tablet  Take 1 Tab by mouth two (2) times daily (with meals).  60 Tab  2          No current facility-administered medications on file prior to visit.             Allergy:     Allergies        Allergen  Reactions         ?  Asa-Acetaminophen-Caff-Potass  Other (comments)             Bleeding in stomach         ?  Benadryl [Diphenhydramine Hcl]  Other  (comments)             Muscle jerking         ?  Other Plant, Higher education careers adviser, Environmental  Not Reported This Time             Grass, Dust and Mold         ?  Pollen Extracts  Not Reported This Time           Review of Systems   General ROS: positive for  - fatigue and sleep disturbance   negative for - chills, fever, hot flashes, malaise or night sweats   ENT ROS: negative for - epistaxis, headaches, hearing change, nasal discharge, nasal polyps,  oral lesions, sinus pain, sneezing, sore throat, tinnitus, vertigo or visual changes   Hematological and Lymphatic ROS: negative for - bleeding problems, blood clots, bruising, jaundice, pallor or swollen lymph nodes   Endocrine ROS: negative for - polydipsia/polyuria, skin changes, temperature intolerance or unexpected weight changes   Respiratory ROS: no cough, shortness of breath, or wheezing   Cardiovascular ROS: no chest pain or dyspnea on exertion   Gastrointestinal ROS: no abdominal pain, change in bowel habits, or black or bloody stools   Genito-Urinary ROS: no dysuria, trouble voiding, or hematuria   Musculoskeletal ROS: negative   Neurological ROS: no TIA or stroke symptoms   Dermatological ROS: negative for - pruritus, rash or skin lesion changes    Psychological ROS: asa bove    Otherwise negative.         Physical Exam:   Blood pressure 140/72, pulse 70, temperature 98 ??F (36.7 ??C), temperature source Oral, resp. rate 18, height 5\' 2"  (1.575 m), weight 85.5 kg (188 lb 6.4 oz), SpO2 97 %. on RA, Body mass index is 34.46 kg/m??.       General: No distress, acyanotic, appears stated age, cooperative, pleasant   HEENT: PERRL, EOMI, throat without erythema or exudate, Tongue- large - without dental  indention on tongue, Mallampati's score 3+, Uvula- midline, Tonsils- not seen,    Neck: Supple,  no abnormally enlarged lymph nodes, thyroid is not enlarged, non-tender, No JVD No Carotid bruit   Chest: Normal.   Lungs: Moderate air entry, clear to auscultation bilaterally,     Heart: Regular rate and rhythm, S1S2 present, without murmur.   Abdomen: Protuberant, bowel sounds normoactive, abdomen is soft without significant tenderness, or guarding.   Extremity: Negative for cyanosis, edema, or clubbing.   Skin: Skin color, texture, turgor normal. No rashes or lesions.   Neurological: CN 2-12 grossly intact, normal muscle tone.      Data Reviewed:   CBC:      Lab Results         Component  Value  Date/Time            WBC  6.2  01/10/2018 04:34 PM       HGB  10.3 (L)  01/10/2018 04:34 PM       HCT  32.5 (L)  01/10/2018 04:34 PM       PLATELET  318  01/10/2018 04:34 PM            MCV  79.9  01/10/2018 04:34 PM           BMP:      Lab Results         Component  Value  Date/Time            Sodium  136  10/10/2018 03:55 PM       Potassium  3.5  10/10/2018 03:55 PM       Chloride  98 (L)  10/10/2018 03:55 PM       CO2  35 (H)  10/10/2018 03:55 PM       Anion gap  3  10/10/2018 03:55 PM       Glucose  125 (H)  10/10/2018 03:55 PM       BUN  10  10/10/2018 03:55 PM       Creatinine  0.83  10/10/2018 03:55 PM       BUN/Creatinine ratio  12  10/10/2018 03:55 PM       GFR est AA  >60  10/10/2018  03:55 PM       GFR est non-AA  >60  10/10/2018 03:55 PM            Calcium  9.5  10/10/2018 03:55 PM              Lab Results         Component  Value  Date/Time            TSH  30.80 (H)  10/10/2018 03:55 PM            T4, Free  1.3  03/29/2017 05:30 PM          Lab Results         Component  Value  Date/Time            Hemoglobin A1c  5.7 (H)  10/10/2018 03:55 PM           Imaging:   [x] I have  personally reviewed the patients radiographs section      Results from Hospital Encounter encounter on 02/13/17     XR CHEST PA LAT           Narrative  Clinical history: Cough, chest pain      EXAMINATION: PA and lateral views of the chest 02/13/2017      Correlation: 07/02/2016      FINDINGS:   Anterior cervical. There is cardiomegaly. Trachea is midline. Lungs are clear.   Degenerative changes of the spine.               Impression  IMPRESSION:   Cardiomegaly.                Results from [place] encounter on 01/10/18     CT HEAD WO CONT           Narrative  EXAMINATION: CT head without contrast      INDICATION: Dizziness, fatigue      COMPARISON: None      TECHNIQUE: CT of the head performed without contrast, with multiplanar   reformations. All CT scans at this facility are performed using dose   optimization technique as appropriate to a performed exam, to include automated   exposure control, adjustment of the mA and/or kV according to patient size   (including appropriate matching first site specific examinations), or use of   iterative reconstruction technique.      FINDINGS: No focal mass effect or shift of midline structures. No abnormal   extra-axial collection identified. Ventricles are normal in size and   configuration. No evidence of acute intracranial hemorrhage. No suspicious   parenchymal lesions. Calvarium intact. Imaged paranasal sinuses and mastoids are   well-aerated. Superficial soft tissues unremarkable.              Impression  IMPRESSION:      No acute intracranial findings. Of note, MRI is more sensitive in the detection   of acute infarct.            Historical Sleep Testing Data:   - Unable to review distant study at this time.         Cecil Cranker, DO, FCCP   Pulmonary, Sleep and Critical Care Medicine

## 2019-02-20 NOTE — Progress Notes (Signed)
 Elaine [name] presents today for   Chief Complaint   Patient presents with   . Sleep Apnea     Pt does not currently have a CPAP. Pt states she always feels tired during the daytime.        Is someone accompanying this pt? No    Is the patient using any DME equipment during OV? No    -DME Company NA    Depression Screening:  3 most recent PHQ Screens 10/14/2018   Little interest or pleasure in doing things Not at all   Feeling down, depressed, irritable, or hopeless Not at all   Total Score PHQ 2 0       Learning Assessment:  Learning Assessment 03/07/2018   PRIMARY LEARNER Patient   HIGHEST LEVEL OF EDUCATION - PRIMARY LEARNER  -   BARRIERS PRIMARY LEARNER -   CO-LEARNER CAREGIVER -   PRIMARY LANGUAGE ENGLISH   LEARNER PREFERENCE PRIMARY READING   ANSWERED BY patient   RELATIONSHIP SELF       Abuse Screening:  Abuse Screening Questionnaire 12/20/2018   Do you ever feel afraid of your partner? N   Are you in a relationship with someone who physically or mentally threatens you? N   Is it safe for you to go home? Y       Fall Risk  Fall Risk Assessment, last 12 mths 12/20/2018   Able to walk? Yes   Fall in past 12 months? No         Coordination of Care:  1. Have you been to the ER, urgent care clinic since your last visit? Hospitalized since your last visit? No    2. Have you seen or consulted any other health care providers outside of the [facility] since your last visit? Include any pap smears or colon screening. No.

## 2019-02-20 NOTE — Progress Notes (Signed)
Elaine [name]  Pulmonary, Critical Care, and Sleep Medicine    Office Progress Note- Initial Evaluation      Primary Care Physician: Thana Farr, MD     Reason for Visit:  Evaluation for possible sleep disorder    Assessment:  1. Snoring  2. Excessive Daytime Sleepiness (EDS)  3. H/O OSA- no recent PAP therapy.   4. Nocturia- gets up to use the bathroom 3-5 times nightly  5. Asthma  6. Seasonal Allergies  7. Obesity: Body mass index is 34.46 kg/m??.  8.          Plan:    ?? Schedule patient for home sleep study for further evaluation.  ?? Potential consequences of untreated sleep apnea, and/or excessive daytime sleepiness were discussed with the patient.  ?? Scientist, clinical (histocompatibility and immunogenetics) provided.  ?? Treatment options including CPAP, dental appliance, weight reduction measures, positional therapy, surgeries etc were discussed.  ?? Healthy lifestyle changes to include weight loss and exercise discussed.  ?? Healthy sleep habits were reviewed and encouraged.  ?? Driver and workplace safety reviewed and discussed as appropriate. Drowsy and/or inattentive driving should be avoided.  ?? Follow-up in after sleep testing , sooner should new symptoms or problems arise.  ?? Follow up with Primary Care Provider (PCP) as directed and for routine health care maintenance.         History of Present Illness: Elaine [name] is a 68 y.o. female patient who presents for evaluation for possible sleep breathing disorder. The history was provided by the patient.    The patient reports that she had a sleep study more than 10 years ago.No recent PAP therapy use    Occupation:    Building control surveyor                     Work Schedule: [place] work: No    Driving:  yes  Drowsy Driving: is reported.      Motor vehicle accident(s) associated with drowsy driving have not occurred.     Snoring:  This is a Chronic problem which has been ongoing for years. Witnessed apneas are not reported.    Fatigue:    This is a Chronic problem which has been ongoing for years.    Dental: Teeth clenching or grinding ist reported.    Naps: are reported- not always refreshing.    Leg Symptoms/Pain: She does not have unpleasant or crawling sensation in legs or strong urge to move when inactive.     GERD: is not reported.      Mood: Happy.    Sleep-Wake History:     Estimates sleeping approximately 6 hours per night/day.    Reports sleeping in a Bed with 2 pillows under their head.    He gets into bed at approximately 23-2400.  Once in bed, she usually goes to sleep.  It usually takes up to 15 minutes to fall asleep after going to bed.     Reports waking up to use the bathroom 3-5 times nightly.    Pain, typically does not disturb their sleep.    Vivid dreams are not reported.    She normally awakens without an alarm to start their day at 0400-0500.  She  typically gets out of bed 0500 .    Waking up with a morning headache is reported.    Awakening with a dry mouth is reported.    Symptom(s) suggestive of cataplexy is not  reported.  Sleep paralysis is reported.- 2 times total. Last event distant past.    Hypnagogic and/or hypnopompic hallucinations are not reported.    Sleep walking is not  reported.      Sleep talking is not  reported.    Other unusual and/or parasomnia behaviors are not reported.    Family Sleep History:  - Nephew:  Reportedly died from sleep Apnea        Stop [place] Lighter 02/20/2019   Does the patient snore loudly (louder than talking or loud enough to be heard through closed doors)? 1   Does the patient often feel tired, fatigued, or sleepy during the daytime, even after a "good" night's sleep? 1   Has anyone ever observed the patient stop breathing during their sleep?  0   Does the patient have or are they being treated for high blood pressure? 1   Is the patient's BMI greater than 35? 0   Is your neck circumference greater than 17 inches (Female) or 16 inches (Female)? 0   Is the patient older than 69? 1    Is the patient female? 0   OSA Score 4       3 most recent PHQ Screens 10/14/2018   Little interest or pleasure in doing things Not at all   Feeling down, depressed, irritable, or hopeless Not at all   Total Score PHQ 2 0       Epworth Scale 02/20/2019   Sitting and Reading 3   Watching TV 3   Sitting, inactive in a public place (e.g. a movie theater or meeting) 3   As a passenger in a car for an hour, without a break 3   Lying down to rest in the afternoon, when circumstances permit 3   Sitting and talking to someone 1   Sitting quietly after lunch without alcohol 2   In a car, while stopped for a few minutes in traffic 1   Epworth Sleepiness Score 19        Neck circ. in "inches": 14         Past Medical History:  Past Medical History:   Diagnosis Date   ??? Anemia    ??? Asthma    ??? Colon polyp    ??? DDD (degenerative disc disease) 02/15/2009   ??? Depression 12/18/2011   ??? Essential hypertension, benign    ??? History of echocardiogram 03/20/2004    EF 70%.  Borderline DDfx.  No significant valvular pathology.   ??? Hypercholesterolemia    ??? Hypertension    ??? Iron deficiency anemia 7/32/2025   ??? 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 Lower extremity venous duplex 10/23/2009    Left leg:  No DVT.     ??? Meralgia paraesthetica 10/22/2013   ??? OA (osteoarthritis of spine) 02/15/2009   ??? OA (osteoarthritis of spine) 02/15/2009   ??? Obesity, unspecified    ??? Other and unspecified hyperlipidemia    ??? Pre-operative cardiovascular examination     For spine surgery   ??? Right sided sciatica    ??? Shortness of breath     Possible asthma, HCVD; less likely CAD (Noted 03/15/09)   ??? Sleep apnea 02/15/2009    uses cpap machine   ??? Thallium stress test  03/20/2004    Partially transient, mod basal & mid anterior defect most c/w artifact; mild anterior ischemia less likely.  Neg EKG on pharm stress test.   ??? Thyroid disease     hypothyroidism  Past Surgical History:  Past Surgical History:   Procedure Laterality Date   ??? HX COLONOSCOPY  02-24-12     normal (Dr. Angelique Holm)   ??? HX HERNIA REPAIR      3x   ??? HX HYSTERECTOMY     ??? HX TUBAL LIGATION     ??? NEUROLOGICAL PROCEDURE UNLISTED  03-16-2009    s/p ACD & Fusion (Dr.P.Gurtner)   ??? PR COLONOSCOPY FLX DX W/COLLJ SPEC WHEN PFRMD  02-2007    +polyp(tubular adenoma); Dr Angelique Holm       Family History:  Family History   Problem Relation Age of Onset   ??? Cancer Mother    ??? Hypertension Mother    ??? Hypertension Sister         x3   ??? Heart Surgery Sister    ??? Heart Disease Sister    ??? Heart Attack Sister    ??? Cancer Maternal Aunt         breast   ??? Breast Cancer Maternal Aunt    ??? Glaucoma Son    ??? Heart defect Son    ??? Diabetes Paternal Uncle    ??? Hypertension Paternal Uncle        Social History:  Social History     Tobacco Use   ??? Smoking status: Former Smoker     Packs/day: 1.00     Years: 28.00     Pack years: 28.00     Last attempt to quit: 04/25/2004     Years since quitting: 14.8   ??? Smokeless tobacco: Never Used   Substance Use Topics   ??? Alcohol use: No   ??? Drug use: No        Caffeine Amount Time of last Intake Comments   Coffee Several/day evening    Soda 2/day  Diet Pepsi   Tea Rare     Energy Drinks None     Over- the - counter stimulant pills None     Other Substances      Alcohol None     Tobacco None     Drugs None     Other: None         Medications:  Current Outpatient Medications on File Prior to Visit   Medication Sig Dispense Refill   ??? levothyroxine (SYNTHROID) 150 mcg tablet take 1 tablet by mouth once daily before breakfast 90 Tab 1   ??? montelukast (SINGULAIR) 10 mg tablet take 1 tablet by mouth at bedtime if needed 90 Tab 1   ??? atorvastatin (LIPITOR) 40 mg tablet take 1 tablet by mouth once daily 30 Tab 5   ??? valsartan-hydroCHLOROthiazide (DIOVAN-HCT) 160-25 mg per tablet Take 0.5 Tabs by mouth daily. 90 Tab 3   ??? terbinafine HCl (LAMISIL) 250 mg tablet Take 1 Tab by mouth daily. 90 Tab 0   ??? gabapentin (NEURONTIN) 300 mg capsule take 1 capsule by mouth every  morning and 2 capsules by mouth at bedtime 180 Cap 1   ??? potassium chloride (KLOR-CON) 10 mEq tablet Take 1 Tab by mouth daily. 90 Tab 3   ??? latanoprost (XALATAN) 0.005 % ophthalmic solution      ??? multivitamin (ONE A DAY) tablet Take 1 Tab by mouth daily.     ??? Cholecalciferol, Vitamin D3, (VITAMIN D-3) 5,000 unit Tab Take  by mouth daily.     ??? pantoprazole (PROTONIX) 40 mg tablet Take 1 Tab by mouth daily. 30 Tab 2   ??? b complex vitamins tablet  Take 1 Tab by mouth daily.     ??? carvediloL (COREG) 3.125 mg tablet Take 1 Tab by mouth two (2) times daily (with meals). 60 Tab 2     No current facility-administered medications on file prior to visit.         Allergy:  Allergies   Allergen Reactions   ??? Asa-Acetaminophen-Caff-Potass Other (comments)     Bleeding in stomach   ??? Benadryl [Diphenhydramine Hcl] Other (comments)     Muscle jerking   ??? Other Plant, Animal, Environmental Not Reported This Time     Grass, Dust and Mold   ??? Pollen Extracts Not Reported This Time       Review of Systems  General ROS: positive for  - fatigue and sleep disturbance  negative for - chills, fever, hot flashes, malaise or night sweats  ENT ROS: negative for - epistaxis, headaches, hearing change, nasal discharge, nasal polyps, oral lesions, sinus pain, sneezing, sore throat, tinnitus, vertigo or visual changes  Hematological and Lymphatic ROS: negative for - bleeding problems, blood clots, bruising, jaundice, pallor or swollen lymph nodes  Endocrine ROS: negative for - polydipsia/polyuria, skin changes, temperature intolerance or unexpected weight changes  Respiratory ROS: no cough, shortness of breath, or wheezing  Cardiovascular ROS: no chest pain or dyspnea on exertion  Gastrointestinal ROS: no abdominal pain, change in bowel habits, or black or bloody stools  Genito-Urinary ROS: no dysuria, trouble voiding, or hematuria  Musculoskeletal ROS: negative  Neurological ROS: no TIA or stroke symptoms   Dermatological ROS: negative for - pruritus, rash or skin lesion changes   Psychological ROS: asa bove   Otherwise negative.      Physical Exam:  Blood pressure 140/72, pulse 70, temperature 98 ??F (36.7 ??C), temperature source Oral, resp. rate 18, height 5\' 2"  (1.575 m), weight 85.5 kg (188 lb 6.4 oz), SpO2 97 %.on RA, Body mass index is 34.46 kg/m??.     General: No distress, acyanotic, appears stated age, cooperative, pleasant  HEENT: PERRL, EOMI, throat without erythema or exudate, Tongue- large - without dental indention on tongue, Mallampati's score 3+, Uvula- midline, Tonsils- not seen,   Neck: Supple,  no abnormally enlarged lymph nodes, thyroid is not enlarged, non-tender, No JVD No Carotid bruit  Chest: Normal.  Lungs: Moderate air entry, clear to auscultation bilaterally,   Heart: Regular rate and rhythm, S1S2 present, without murmur.  Abdomen: Protuberant, bowel sounds normoactive, abdomen is soft without significant tenderness, or guarding.  Extremity: Negative for cyanosis, edema, or clubbing.  Skin: Skin color, texture, turgor normal. No rashes or lesions.  Neurological: CN 2-12 grossly intact, normal muscle tone.    Data Reviewed:  CBC:   Lab Results   Component Value Date/Time    WBC 6.2 01/10/2018 04:34 PM    HGB 10.3 (L) 01/10/2018 04:34 PM    HCT 32.5 (L) 01/10/2018 04:34 PM    PLATELET 318 01/10/2018 04:34 PM    MCV 79.9 01/10/2018 04:34 PM       BMP:   Lab Results   Component Value Date/Time    Sodium 136 10/10/2018 03:55 PM    Potassium 3.5 10/10/2018 03:55 PM    Chloride 98 (L) 10/10/2018 03:55 PM    CO2 35 (H) 10/10/2018 03:55 PM    Anion gap 3 10/10/2018 03:55 PM    Glucose 125 (H) 10/10/2018 03:55 PM    BUN 10 10/10/2018 03:55 PM    Creatinine 0.83 10/10/2018 03:55 PM    BUN/Creatinine ratio  12 10/10/2018 03:55 PM    GFR est AA >60 10/10/2018 03:55 PM    GFR est non-AA >60 10/10/2018 03:55 PM    Calcium 9.5 10/10/2018 03:55 PM        Lab Results   Component Value Date/Time     TSH 30.80 (H) 10/10/2018 03:55 PM    T4, Free 1.3 03/29/2017 05:30 PM     Lab Results   Component Value Date/Time    Hemoglobin A1c 5.7 (H) 10/10/2018 03:55 PM       Imaging:  [x] I have personally reviewed the patient???s radiographs section   Results from Hospital Encounter encounter on 02/13/17   XR CHEST PA LAT    Narrative Clinical history: Cough, chest pain    EXAMINATION: PA and lateral views of the chest 02/13/2017    Correlation: 07/02/2016    FINDINGS:  Anterior cervical. There is cardiomegaly. Trachea is midline. Lungs are clear.  Degenerative changes of the spine.      Impression IMPRESSION:  Cardiomegaly.         Results from [place] encounter on 01/10/18   CT HEAD WO CONT    Narrative EXAMINATION: CT head without contrast    INDICATION: Dizziness, fatigue    COMPARISON: None    TECHNIQUE: CT of the head performed without contrast, with multiplanar  reformations. All CT scans at this facility are performed using dose  optimization technique as appropriate to a performed exam, to include automated  exposure control, adjustment of the mA and/or kV according to patient size  (including appropriate matching first site specific examinations), or use of  iterative reconstruction technique.    FINDINGS: No focal mass effect or shift of midline structures. No abnormal  extra-axial collection identified. Ventricles are normal in size and  configuration. No evidence of acute intracranial hemorrhage. No suspicious  parenchymal lesions. Calvarium intact. Imaged paranasal sinuses and mastoids are  well-aerated. Superficial soft tissues unremarkable.      Impression IMPRESSION:    No acute intracranial findings. Of note, MRI is more sensitive in the detection  of acute infarct.        Historical Sleep Testing Data:  - Unable to review distant study at this time.      Cecil Cranker, DO, FCCP  Pulmonary, Sleep and Critical Care Medicine

## 2019-02-20 NOTE — Progress Notes (Signed)
Elaine [name] presents today for   Chief Complaint   Patient presents with   ??? Sleep Apnea     Pt does not currently have a CPAP. Pt states she always feels tired during the daytime.        Is someone accompanying this pt? No    Is the patient using any DME equipment during OV? No    -DME Company NA    Depression Screening:  3 most recent PHQ Screens 10/14/2018   Little interest or pleasure in doing things Not at all   Feeling down, depressed, irritable, or hopeless Not at all   Total Score PHQ 2 0       Learning Assessment:  Learning Assessment 03/07/2018   PRIMARY LEARNER Patient   HIGHEST LEVEL OF EDUCATION - PRIMARY LEARNER  -   BARRIERS PRIMARY LEARNER -   CO-LEARNER CAREGIVER -   PRIMARY LANGUAGE ENGLISH   LEARNER PREFERENCE PRIMARY READING   ANSWERED BY patient   RELATIONSHIP SELF       Abuse Screening:  Abuse Screening Questionnaire 12/20/2018   Do you ever feel afraid of your partner? N   Are you in a relationship with someone who physically or mentally threatens you? N   Is it safe for you to go home? Y       Fall Risk  Fall Risk Assessment, last 12 mths 12/20/2018   Able to walk? Yes   Fall in past 12 months? No         Coordination of Care:  1. Have you been to the ER, urgent care clinic since your last visit? Hospitalized since your last visit? No    2. Have you seen or consulted any other health care providers outside of the [facility] since your last visit? Include any pap smears or colon screening. No.

## 2019-02-21 ENCOUNTER — Ambulatory Visit: Admit: 2019-02-21 | Discharge: 2019-02-21 | Payer: MEDICARE | Attending: Internal Medicine | Primary: Internal Medicine

## 2019-02-21 ENCOUNTER — Ambulatory Visit: Attending: Internal Medicine | Primary: Internal Medicine

## 2019-02-21 DIAGNOSIS — J029 Acute pharyngitis, unspecified: Secondary | ICD-10-CM

## 2019-02-21 LAB — AMB POC RAPID STREP A
Group A Strep Ag: NEGATIVE
Group A Strep Antigen, POC: NEGATIVE

## 2019-02-21 MED ORDER — QVAR 80 MCG/ACTUATION METERED AEROSOL ORAL INHALER
80 mcg/actuation | Freq: Two times a day (BID) | RESPIRATORY_TRACT | 3 refills | Status: DC
Start: 2019-02-21 — End: 2019-02-28

## 2019-02-21 MED ORDER — QVAR 80 MCG/ACTUATION METERED AEROSOL ORAL INHALER
80 mcg/actuation | Freq: Two times a day (BID) | RESPIRATORY_TRACT | 3 refills | Status: DC
Start: 2019-02-21 — End: 2019-02-21

## 2019-02-21 NOTE — Progress Notes (Signed)
Patient is in the office today for a sore throat.      1. Have you been to the ER, urgent care clinic since your last visit?  Hospitalized since your last visit?No    2. Have you seen or consulted any other health care providers outside of the [facility] since your last visit?  Include any pap smears or colon screening. No

## 2019-02-21 NOTE — Progress Notes (Signed)
Elaine [name] is a 68 y.o.  female and presents with Sore Throat      SUBJECTIVE:    Patient has a history of chronic throat discomfort for which she has seen ENT in the past and has had benign biopsies.  She has been treated for asthma as well as reflux and allergic rhinitis without any improvement in this discomfort in her throat.  She denies any fever any chills.  She is a former smoker.    Respiratory ROS: negative for - shortness of breath  Cardiovascular ROS: negative for - chest pain    Current Outpatient Medications   Medication Sig   ??? beclomethasone (Qvar) 80 mcg/actuation aero Take 2 Puffs by inhalation two (2) times a day.   ??? pantoprazole (PROTONIX) 40 mg tablet Take 1 Tab by mouth daily.   ??? levothyroxine (SYNTHROID) 150 mcg tablet take 1 tablet by mouth once daily before breakfast   ??? montelukast (SINGULAIR) 10 mg tablet take 1 tablet by mouth at bedtime if needed   ??? atorvastatin (LIPITOR) 40 mg tablet take 1 tablet by mouth once daily   ??? b complex vitamins tablet Take 1 Tab by mouth daily.   ??? carvediloL (COREG) 3.125 mg tablet Take 1 Tab by mouth two (2) times daily (with meals).   ??? valsartan-hydroCHLOROthiazide (DIOVAN-HCT) 160-25 mg per tablet Take 0.5 Tabs by mouth daily.   ??? terbinafine HCl (LAMISIL) 250 mg tablet Take 1 Tab by mouth daily.   ??? gabapentin (NEURONTIN) 300 mg capsule take 1 capsule by mouth every morning and 2 capsules by mouth at bedtime   ??? potassium chloride (KLOR-CON) 10 mEq tablet Take 1 Tab by mouth daily.   ??? latanoprost (XALATAN) 0.005 % ophthalmic solution    ??? multivitamin (ONE A DAY) tablet Take 1 Tab by mouth daily.   ??? Cholecalciferol, Vitamin D3, (VITAMIN D-3) 5,000 unit Tab Take  by mouth daily.     No current facility-administered medications for this visit.          OBJECTIVE:  alert, well appearing, and in no distress  Visit Vitals  BP 168/85 (BP 1 Location: Left arm, BP Patient Position: Sitting)   Pulse 95   Temp 99.1 ??F (37.3 ??C) (Oral)   Resp 20   Ht 5'  2" (1.575 m)   Wt 187 lb (84.8 kg)   SpO2 98%   BMI 34.20 kg/m??      well developed and well nourished  Mouth - mucous membranes moist, pharynx normal without lesions  Neck - supple, no significant adenopathy  Chest - clear to auscultation, no wheezes, rales or rhonchi, symmetric air entry  Heart - normal rate, regular rhythm, normal S1, S2, no murmurs, rubs, clicks or gallops    Results for orders placed or performed in visit on 02/21/19   AMB POC RAPID STREP A   Result Value Ref Range    VALID INTERNAL CONTROL POC Yes     Group A Strep Ag Negative Negative         Assessment/Plan      ICD-10-CM ICD-9-CM    1. Sore throat J02.9 462 AMB POC RAPID STREP A      REFERRAL TO ENT-OTOLARYNGOLOGY for further evaluation    2. Seasonal allergic rhinitis due to pollen J30.1 477.0 Controlled on Singulair      Follow-up and Dispositions    ?? Return in about 3 months (around 05/24/2019) for labs 1 week before.  Reviewed plan of care. Patient has provided input and agrees with goals.

## 2019-02-21 NOTE — Progress Notes (Signed)
Patient is in the office today for a sore throat.      1. Have you been to the ER, urgent care clinic since your last visit?  Hospitalized since your last visit?No    2. Have you seen or consulted any other health care providers outside of the [facility] since your last visit?  Include any pap smears or colon screening. No

## 2019-02-21 NOTE — Progress Notes (Deleted)
HISTORY OF PRESENT ILLNESS  Elaine [name] is a 68 y.o. female.  Cold Symptoms         ROS    Physical Exam    ASSESSMENT and PLAN  {ASSESSMENT/PLAN:19072}

## 2019-02-21 NOTE — Progress Notes (Signed)
Elaine [name] is a 68 y.o.  female and presents with Sore Throat      SUBJECTIVE:    Patient has a history of chronic throat discomfort for which she has seen ENT in the past and has had benign biopsies.  She has been treated for asthma as well as reflux and allergic rhinitis without any improvement in this discomfort in her throat.  She denies any fever any chills.  She is a former smoker.    Respiratory ROS: negative for - shortness of breath  Cardiovascular ROS: negative for - chest pain    Current Outpatient Medications   Medication Sig   ??? beclomethasone (Qvar) 80 mcg/actuation aero Take 2 Puffs by inhalation two (2) times a day.   ??? pantoprazole (PROTONIX) 40 mg tablet Take 1 Tab by mouth daily.   ??? levothyroxine (SYNTHROID) 150 mcg tablet take 1 tablet by mouth once daily before breakfast   ??? montelukast (SINGULAIR) 10 mg tablet take 1 tablet by mouth at bedtime if needed   ??? atorvastatin (LIPITOR) 40 mg tablet take 1 tablet by mouth once daily   ??? b complex vitamins tablet Take 1 Tab by mouth daily.   ??? carvediloL (COREG) 3.125 mg tablet Take 1 Tab by mouth two (2) times daily (with meals).   ??? valsartan-hydroCHLOROthiazide (DIOVAN-HCT) 160-25 mg per tablet Take 0.5 Tabs by mouth daily.   ??? terbinafine HCl (LAMISIL) 250 mg tablet Take 1 Tab by mouth daily.   ??? gabapentin (NEURONTIN) 300 mg capsule take 1 capsule by mouth every morning and 2 capsules by mouth at bedtime   ??? potassium chloride (KLOR-CON) 10 mEq tablet Take 1 Tab by mouth daily.   ??? latanoprost (XALATAN) 0.005 % ophthalmic solution    ??? multivitamin (ONE A DAY) tablet Take 1 Tab by mouth daily.   ??? Cholecalciferol, Vitamin D3, (VITAMIN D-3) 5,000 unit Tab Take  by mouth daily.     No current facility-administered medications for this visit.          OBJECTIVE:  alert, well appearing, and in no distress  Visit Vitals  BP 168/85 (BP 1 Location: Left arm, BP Patient Position: Sitting)   Pulse 95   Temp 99.1 ??F (37.3 ??C) (Oral)   Resp 20    Ht 5\' 2"  (1.575 m)   Wt 187 lb (84.8 kg)   SpO2 98%   BMI 34.20 kg/m??      well developed and well nourished  Mouth - mucous membranes moist, pharynx normal without lesions  Neck - supple, no significant adenopathy  Chest - clear to auscultation, no wheezes, rales or rhonchi, symmetric air entry  Heart - normal rate, regular rhythm, normal S1, S2, no murmurs, rubs, clicks or gallops    Results for orders placed or performed in visit on 02/21/19   AMB POC RAPID STREP A   Result Value Ref Range    VALID INTERNAL CONTROL POC Yes     Group A Strep Ag Negative Negative         Assessment/Plan      ICD-10-CM ICD-9-CM    1. Sore throat J02.9 462 AMB POC RAPID STREP A      REFERRAL TO ENT-OTOLARYNGOLOGY for further evaluation    2. Seasonal allergic rhinitis due to pollen J30.1 477.0 Controlled on Singulair      Follow-up and Dispositions    ?? Return in about 3 months (around 05/24/2019) for labs 1 week before.  Reviewed plan of care. Patient has provided input and agrees with goals.

## 2019-02-28 ENCOUNTER — Encounter

## 2019-02-28 MED ORDER — GABAPENTIN 300 MG CAP
300 mg | ORAL_CAPSULE | ORAL | 1 refills | Status: DC
Start: 2019-02-28 — End: 2019-07-24

## 2019-02-28 MED ORDER — FLUTICASONE 110 MCG/ACTUATION AEROSOL INHALER
110 mcg/actuation | Freq: Two times a day (BID) | RESPIRATORY_TRACT | 5 refills | Status: DC
Start: 2019-02-28 — End: 2019-03-01

## 2019-02-28 MED ORDER — TERBINAFINE 250 MG TAB
250 mg | ORAL_TABLET | ORAL | 0 refills | Status: DC
Start: 2019-02-28 — End: 2019-06-06

## 2019-02-28 NOTE — Telephone Encounter (Signed)
Flovent HFA sent in

## 2019-02-28 NOTE — Telephone Encounter (Signed)
PA for Qvar denied.  Patient will have to try Flovent HFA, or Arnuity Ellipta.  Please advise.

## 2019-03-01 MED ORDER — ARNUITY ELLIPTA 200 MCG/ACTUATION POWDER FOR INHALATION
200 mcg/actuation | RESPIRATORY_TRACT | 3 refills | Status: DC
Start: 2019-03-01 — End: 2019-06-23

## 2019-03-30 DIAGNOSIS — I10 Essential (primary) hypertension: Secondary | ICD-10-CM | POA: Diagnosis not present

## 2019-04-06 ENCOUNTER — Inpatient Hospital Stay: Admit: 2019-04-06 | Payer: MEDICARE | Primary: Internal Medicine

## 2019-04-06 DIAGNOSIS — G4733 Obstructive sleep apnea (adult) (pediatric): Secondary | ICD-10-CM

## 2019-04-07 ENCOUNTER — Inpatient Hospital Stay: Admit: 2019-04-07 | Payer: MEDICARE | Primary: Internal Medicine

## 2019-04-10 ENCOUNTER — Encounter

## 2019-04-10 NOTE — Progress Notes (Signed)
Progress Notes by Melissa Montane, DO at 04/10/19 1547                Author: Melissa Montane, DO  Service: --  Author Type: Physician       Filed: 04/10/19 1549  Encounter Date: 04/10/2019  Status: Signed          Editor: Melissa Montane, DO (Physician)               The patient underwent sleep testing on 04/07/2019. Recommendations listed below.  Please refer to full report for specific details.         Interpretation   ? 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 Due to some dampening signal, the severity of CSA may be under estimalted   ? The Oxygen Desaturation Index (ODI) was 18.7 and the SpO2 nadir for this study was   58%. The SpO2 average was 90%   ? The heart rate variability along with some artifact was noted.The average heart rate was   65 bpm bpm   ? Start Auto- BIPAP: IPAP:max: 25 cwp, EPAP min: 7 cwp, PS: 5, Ramp: 10 min   ?   ? Other non-invasive treatment options are recommended were applicable and include   the following: weight reduction, smoking cessation, body position training, and modification of   alcohol ingestion and/or sedating agents.   ? Healthy sleep habit education and reinforcement will be reviewed with the patient.   ? Individuals are encouraged to obtain 7-9 hours of sleep per day.   ? Passenger transport manager is encouraged. Drowsy and/or inattentive driving should be avoided.   ? Follow up with referring provider as directed.      Cecil Cranker, DO, FCCP        [facility]   Pulmonary, Critical Care, and Sleep Medicine

## 2019-04-10 NOTE — Progress Notes (Signed)
The patient underwent sleep testing on 04/07/2019. Recommendations listed below.  Please refer to full report for specific details.      Interpretation  ??? 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 Due to some dampening signal, the severity of CSA may be under estimalted  ??? The Oxygen Desaturation Index (ODI) was 18.7 and the SpO2 nadir for this study was  58%. The SpO2 average was 90%  ??? The heart rate variability along with some artifact was noted.The average heart rate was  65 bpm bpm  ??? Start Auto- BIPAP: IPAP:max: 25 cwp, EPAP min: 7 cwp, PS: 5, Ramp: 10 min  ???  ??? Other non-invasive treatment options are recommended were applicable and include  the following: weight reduction, smoking cessation, body position training, and modification of  alcohol ingestion and/or sedating agents.  ??? Healthy sleep habit education and reinforcement will be reviewed with the patient.  ??? Individuals are encouraged to obtain 7-9 hours of sleep per day.  ??? Driver safety is encouraged. Drowsy and/or inattentive driving should be avoided.  ??? Follow up with referring provider as directed.    Cecil Cranker, DO, FCCP    [facility]  Pulmonary, Critical Care, and Sleep Medicine

## 2019-04-12 ENCOUNTER — Telehealth: Admit: 2019-04-12 | Discharge: 2019-04-12 | Payer: MEDICARE | Attending: Pulmonary Disease | Primary: Internal Medicine

## 2019-04-12 ENCOUNTER — Encounter: Attending: Pulmonary Disease | Primary: Internal Medicine

## 2019-04-12 DIAGNOSIS — G4731 Primary central sleep apnea: Secondary | ICD-10-CM

## 2019-04-12 NOTE — Progress Notes (Signed)
Progress Notes by Melissa Montane, DO at 04/12/19 1100                Author: Melissa Montane, DO  Service: --  Author Type: Physician       Filed: 04/12/19 1214  Encounter Date: 04/12/2019  Status: Signed          Editor: Melissa Montane, DO (Physician)                     Consent: [name], who was seen by synchronous (real-time) audio-video technology, and/or  her healthcare decision maker, is aware that this patient-initiated, Telehealth encounter on 04/12/2019  is a billable service, with coverage as determined by her insurance carrier. She  is aware that she may receive a bill and has provided verbal consent to proceed: Yes .      Please note that clinic was able to establish video with patient but patient could not see [place]. Several attempts were made to establish bi-directional video but we were not able to do that so the  evaluation was changed over to a telephone evaluation.              Assessment & Plan:         Assessment:   1.  ComplexSleep Apnea (OSA): Severe: (HST: 04/07/19)- Overall AHI: 31.2 and CAI:  12.1   2.  Excessive Daytime Sleepiness (EDS)/ Hypersomnia- persistent- more than likely related to untreated CSA   3.  H/O OSA- no recent PAP therapy.    4.  Nocturia- gets up to use the bathroom 3-5 times nightly   5.  Asthma   6.  Seasonal Allergies   7.  Obesity: Body mass index is 34.46 kg/m??.      Plan:   ??  Start patient on Auto-BIPAP: IPAP max 25 cwp, EPAP: min: 7 cwp PS:5 Ramp 10 min and monitor for response- especially ongoing central apneas.   If central apneas do not resolve will need to switch to BIPAP- ST   ??  I discussed with patient that she may need to undergo a future titration study pending Insurance and COVID-19 response   ??  COVID-19: general and PAP precautions discussed- Patient should use PAP device in separate room away from other individuals until we have more information about COVID-19   ??  Proper PAP hygiene and compliance requirements reviewed and  discussed   ??  Healthy weight and sleep habits encouraged   ??  Driver safety reviewed   ??  Follow up with Primary Care Provider (PCP) as directed and for routine health care maintenance.   ??  Patient is encouraged to contact our clinic if patient is experiencing in difficulties with using PAP device.   ??  Follow up in our clinic in 3 months; sooner if needed.    ??  Further recommendations pending clinical course.            I spent at least 15 minutes with this established patient, and >50% of the time was spent counseling and/or coordinating care regarding OSA/CSA / CSA   712     Subjective:     Elaine [name] is a 68 y.o.  female who was seen for Results (Sleep Study )      ??  Since last visit the patient underwent a HST on 04/07/19 which was notable for severe CSA with an overall AHI of 35.1 and a CAI of 12.1   ??  Results were reviewed with the patient along with treatment options.  It was discussed that ideally in this situation we would have the patient do to the sleep lab for a manual, in-lab titration study but we are unable to provide this service at this  time due to COVID-19   ??  No health issues or concerns at this time; other than the patient continues to EDS/ Hypersomnia and was wondering if she had narcolepsy.  If she is not engaged in an activity, she can readily fall sleep   ??  Patient tries to stay home as much as possible   ??  She reports practicing good hand hygiene,social distancing and use of mask/ face covering when going out in public.   ??  Her 21 year-old son has been staying with her but he is about to leave             Epworth Scale  04/12/2019  02/20/2019         Sitting and Reading  3  3     Watching TV  3  3     Sitting, inactive in a public place (e.g. a movie theater or meeting)  3  3     As a passenger in a car for an hour, without a break  3  3     Lying down to rest in the afternoon, when circumstances permit  3  3     Sitting and talking to someone  2  1     Sitting quietly after  lunch without alcohol  3  2     In a car, while stopped for a few minutes in traffic  2  1         Epworth Sleepiness Score  22  19           3  most recent PHQ Screens  04/12/2019        Little interest or pleasure in doing things  Not at all     Feeling down, depressed, irritable, or hopeless  Not at all        Total Score PHQ 2  0                Prior to Admission medications             Medication  Sig  Start Date  End Date  Taking?  Authorizing Provider            Arnuity Ellipta 200 mcg/actuation dsdv inhaler  inhale 1 puff by mouth and INTO THE LUNGS twice a day ([place])  03/01/19    Yes  Hunte, Ninfa Meeker, MD     gabapentin (NEURONTIN) 300 mg capsule  take 1 capsule by mouth every morning and 2 at bedtime  02/28/19    Yes  Hunte, Ninfa Meeker, MD     pantoprazole (PROTONIX) 40 mg tablet  Take 1 Tab by mouth daily.  02/02/19    Yes  Thana Farr, MD     levothyroxine (SYNTHROID) 150 mcg tablet  take 1 tablet by mouth once daily before breakfast  01/27/19    Yes  Hunte, Ninfa Meeker, MD            montelukast (SINGULAIR) 10 mg tablet  take 1 tablet by mouth at bedtime if needed  01/23/19    Yes  Hunte, Ninfa Meeker, MD            atorvastatin (  LIPITOR) 40 mg tablet  take 1 tablet by mouth once daily  12/29/18    Yes  Hunte, Ninfa Meeker, MD     carvediloL (COREG) 3.125 mg tablet  Take 1 Tab by mouth two (2) times daily (with meals).  12/23/18    Yes  Carlyle Dolly, MD     valsartan-hydroCHLOROthiazide (DIOVAN-HCT) 160-25 mg per tablet  Take 0.5 Tabs by mouth daily.  12/23/18    Yes  Carlyle Dolly, MD     potassium chloride (KLOR-CON) 10 mEq tablet  Take 1 Tab by mouth daily.  05/09/18    Yes  Black, Myria G, NP     latanoprost (XALATAN) 0.005 % ophthalmic solution    02/06/17    Yes  Provider, Historical     multivitamin (ONE A DAY) tablet  Take 1 Tab by mouth daily.      Yes  Provider, Historical     Cholecalciferol, Vitamin D3, (VITAMIN D-3) 5,000 unit Tab  Take  by mouth daily.      Yes  Provider,  Historical     terbinafine HCL (LAMISIL) 250 mg tablet  take 1 tablet by mouth once daily  02/28/19      Hunte, Ninfa Meeker, MD            b complex vitamins tablet  Take 1 Tab by mouth daily.        Provider, Historical          Allergies        Allergen  Reactions         ?  Asa-Acetaminophen-Caff-Potass  Other (comments)             Bleeding in stomach         ?  Benadryl [Diphenhydramine Hcl]  Other (comments)             Muscle jerking         ?  Other Plant, Higher education careers adviser, Environmental  Not Reported This Time             Grass, Dust and Mold         ?  Pollen Extracts  Not Reported This Time             Allergies        Allergen  Reactions         ?  Asa-Acetaminophen-Caff-Potass  Other (comments)             Bleeding in stomach         ?  Benadryl [Diphenhydramine Hcl]  Other (comments)             Muscle jerking         ?  Other Plant, Higher education careers adviser, Environmental  Not Reported This Time             Grass, Dust and Mold         ?  Pollen Extracts  Not Reported This Time          Past Medical History:        Diagnosis  Date         ?  Anemia       ?  Asthma       ?  Colon polyp       ?  DDD (degenerative disc disease)  02/15/2009     ?  Depression  12/18/2011     ?  Essential hypertension, benign       ?  History of echocardiogram  03/20/2004          EF 70%.  Borderline DDfx.  No significant valvular pathology.         ?  Hypercholesterolemia       ?  Hypertension       ?  Iron deficiency anemia  02/15/2009     ?  Lichen planus  02/27/4009     ?  Lower extremity venous duplex  10/23/2009          Left leg:  No DVT.           ?  Meralgia paraesthetica  10/22/2013     ?  OA (osteoarthritis of spine)  02/15/2009     ?  OA (osteoarthritis of spine)  02/15/2009     ?  Obesity, unspecified       ?  Other and unspecified hyperlipidemia       ?  Pre-operative cardiovascular examination            For spine surgery         ?  Right sided sciatica       ?  Shortness of breath            Possible asthma, HCVD; less likely CAD (Noted 03/15/09)          ?  Sleep apnea  02/15/2009          uses cpap machine         ?  Thallium stress test   03/20/2004          Partially transient, mod basal & mid anterior defect most c/w artifact; mild anterior ischemia less likely.  Neg EKG on pharm stress test.         ?  Thyroid disease            hypothyroidism          Past Surgical History:         Procedure  Laterality  Date          ?  HX COLONOSCOPY    02-24-12          normal (Dr. Angelique Holm)          ?  HX HERNIA REPAIR              3x          ?  HX HYSTERECTOMY         ?  HX TUBAL LIGATION         ?  NEUROLOGICAL PROCEDURE UNLISTED    03-16-2009          s/p ACD & Fusion (Dr.P.Gurtner)          ?  PR COLONOSCOPY FLX DX W/COLLJ SPEC WHEN PFRMD    02-2007          +polyp(tubular adenoma); Dr Angelique Holm          Family History         Problem  Relation  Age of Onset          ?  Cancer  Mother       ?  Hypertension  Mother       ?  Hypertension  Sister                x3          ?  Heart Surgery  Sister       ?  Heart Disease  Sister       ?  Heart Attack  Sister       ?  Cancer  Maternal Aunt                breast          ?  Breast Cancer  Maternal Aunt       ?  Glaucoma  Son       ?  Heart defect  Son       ?  Diabetes  Paternal Uncle            ?  Hypertension  Paternal Uncle            Social History          Tobacco Use         ?  Smoking status:  Former Smoker              Packs/day:  1.00         Years:  28.00         Pack years:  28.00         Last attempt to quit:  04/25/2004         Years since quitting:  14.9         ?  Smokeless tobacco:  Never Used       Substance Use Topics         ?  Alcohol use:  No           ROS- Per HPI              Objective:        N/A                  We discussed the expected course, resolution and complications of the diagnosis(es) in detail.  Medication risks, benefits, costs, interactions, and alternatives were discussed as  indicated.  I advised her to contact the office if her  condition worsens, changes or fails to improve as  anticipated. She expressed understanding with the diagnosis(es) and plan.          Elaine [name] is a 68 y.o.  female being evaluated by a video visit encounter for concerns as above.  A caregiver was present when appropriate. Due to this being a Scientist, physiological  (During XBMWU-13 public health emergency), evaluation of the following organ systems was limited: Vitals/Constitutional/EENT/Resp/CV/GI/GU/MS/Neuro/Skin/Heme-Lymph-Imm.   Pursuant to the emergency declaration under the [place], 1135 waiver authority and the [employer] Act, this Virtual  Visit was conducted, with patient's (and/or  legal guardian's) consent, to reduce the patient's risk of exposure to COVID-19 and provide necessary medical care.       Services were provided through a video synchronous discussion virtually to substitute for in-person clinic visit.            Cecil Cranker, DO, FCCP        [facility]   Pulmonary, Critical Care, and Sleep Medicine

## 2019-04-12 NOTE — Progress Notes (Signed)
Consent: Elaine [name], who was seen by synchronous (real-time) audio-video technology, and/or her healthcare decision maker, is aware that this patient-initiated, Telehealth encounter on 04/12/2019 is a billable service, with coverage as determined by her insurance carrier. She is aware that she may receive a bill and has provided verbal consent to proceed: Yes.    Please note that clinic was able to establish video with patient but patient could not see [place]. Several attempts were made to establish bi-directional video but we were not able to do that so the evaluation was changed over to a telephone evaluation.        Assessment & Plan:      Assessment:  1. ComplexSleep Apnea (OSA): Severe: (HST: 04/07/19)- Overall AHI: 31.2 and CAI: 12.1  2. Excessive Daytime Sleepiness (EDS)/ Hypersomnia- persistent- more than likely related to untreated CSA  3. H/O OSA- no recent PAP therapy.   4. Nocturia- gets up to use the bathroom 3-5 times nightly  5. Asthma  6. Seasonal Allergies  7. Obesity: Body mass index is 34.46 kg/m??.    Plan:  ? Start patient on Auto-BIPAP: IPAP max 25 cwp, EPAP: min: 7 cwp PS:5 Ramp 10 min and monitor for response- especially ongoing central apneas.  If central apneas do not resolve will need to switch to BIPAP- ST  ? I discussed with patient that she may need to undergo a future titration study pending Insurance and COVID-19 response  ? COVID-19: general and PAP precautions discussed- Patient should use PAP device in separate room away from other individuals until we have more information about COVID-19  ? Proper PAP hygiene and compliance requirements reviewed and discussed  ? Healthy weight and sleep habits encouraged  ? Driver safety reviewed  ? Follow up with Primary Care Provider (PCP) as directed and for routine health care maintenance.  ? Patient is encouraged to contact our clinic if patient is experiencing in difficulties with using PAP device.   ? Follow up in our clinic in 3 months; sooner if needed.   ? Further recommendations pending clinical course.        I spent at least 15 minutes with this established patient, and >50% of the time was spent counseling and/or coordinating care regarding OSA/CSA/ CSA  712  Subjective:   Elaine [name] is a 68 y.o. female who was seen for Results (Sleep Study )    ?? Since last visit the patient underwent a HST on 04/07/19 which was notable for severe CSA with an overall AHI of 35.1 and a CAI of 12.1  ?? Results were reviewed with the patient along with treatment options.  It was discussed that ideally in this situation we would have the patient do to the sleep lab for a manual, in-lab titration study but we are unable to provide this service at this time due to COVID-19  ?? No health issues or concerns at this time; other than the patient continues to EDS/ Hypersomnia and was wondering if she had narcolepsy.  If she is not engaged in an activity, she can readily fall sleep  ?? Patient tries to stay home as much as possible  ?? She reports practicing good hand hygiene,social distancing and use of mask/ face covering when going out in public.  ?? Her 71 year-old son has been staying with her but he is about to leave      Epworth Scale 04/12/2019 02/20/2019   Sitting and Reading 3 3   Watching TV 3  3   Sitting, inactive in a public place (e.g. a movie theater or meeting) 3 3   As a passenger in a car for an hour, without a break 3 3   Lying down to rest in the afternoon, when circumstances permit 3 3   Sitting and talking to someone 2 1   Sitting quietly after lunch without alcohol 3 2   In a car, while stopped for a few minutes in traffic 2 1   Epworth Sleepiness Score 22 19     3  most recent PHQ Screens 04/12/2019   Little interest or pleasure in doing things Not at all   Feeling down, depressed, irritable, or hopeless Not at all   Total Score PHQ 2 0         Prior to Admission medications     Medication Sig Start Date End Date Taking? Authorizing Provider   Arnuity Ellipta 200 mcg/actuation dsdv inhaler inhale 1 puff by mouth and INTO THE LUNGS twice a day ([place]) 03/01/19  Yes Hunte, Ninfa Meeker, MD   gabapentin (NEURONTIN) 300 mg capsule take 1 capsule by mouth every morning and 2 at bedtime 02/28/19  Yes Hunte, Ninfa Meeker, MD   pantoprazole (PROTONIX) 40 mg tablet Take 1 Tab by mouth daily. 02/02/19  Yes Thana Farr, MD   levothyroxine (SYNTHROID) 150 mcg tablet take 1 tablet by mouth once daily before breakfast 01/27/19  Yes Hunte, Ninfa Meeker, MD   montelukast (SINGULAIR) 10 mg tablet take 1 tablet by mouth at bedtime if needed 01/23/19  Yes Hunte, Ninfa Meeker, MD   atorvastatin (LIPITOR) 40 mg tablet take 1 tablet by mouth once daily 12/29/18  Yes Hunte, Ninfa Meeker, MD   carvediloL (COREG) 3.125 mg tablet Take 1 Tab by mouth two (2) times daily (with meals). 12/23/18  Yes Carlyle Dolly, MD   valsartan-hydroCHLOROthiazide (DIOVAN-HCT) 160-25 mg per tablet Take 0.5 Tabs by mouth daily. 12/23/18  Yes Carlyle Dolly, MD   potassium chloride (KLOR-CON) 10 mEq tablet Take 1 Tab by mouth daily. 05/09/18  Yes Black, Myria G, NP   latanoprost (XALATAN) 0.005 % ophthalmic solution  02/06/17  Yes Provider, Historical   multivitamin (ONE A DAY) tablet Take 1 Tab by mouth daily.   Yes Provider, Historical   Cholecalciferol, Vitamin D3, (VITAMIN D-3) 5,000 unit Tab Take  by mouth daily.   Yes Provider, Historical   terbinafine HCL (LAMISIL) 250 mg tablet take 1 tablet by mouth once daily 02/28/19   Hunte, Ninfa Meeker, MD   b complex vitamins tablet Take 1 Tab by mouth daily.    Provider, Historical     Allergies   Allergen Reactions   ??? Asa-Acetaminophen-Caff-Potass Other (comments)     Bleeding in stomach   ??? Benadryl [Diphenhydramine Hcl] Other (comments)     Muscle jerking   ??? Other Plant, Animal, Environmental Not Reported This Time     Grass, Dust and Mold    ??? Pollen Extracts Not Reported This Time       Allergies   Allergen Reactions   ??? Asa-Acetaminophen-Caff-Potass Other (comments)     Bleeding in stomach   ??? Benadryl [Diphenhydramine Hcl] Other (comments)     Muscle jerking   ??? Other Plant, Animal, Environmental Not Reported This Time     Grass, Dust and Mold   ??? Pollen Extracts Not Reported This Time     Past Medical History:   Diagnosis Date   ???  Anemia    ??? Asthma    ??? Colon polyp    ??? DDD (degenerative disc disease) 02/15/2009   ??? Depression 12/18/2011   ??? Essential hypertension, benign    ??? History of echocardiogram 03/20/2004    EF 70%.  Borderline DDfx.  No significant valvular pathology.   ??? Hypercholesterolemia    ??? Hypertension    ??? Iron deficiency anemia 2/95/6213   ??? Lichen planus 0/86/5784   ??? Lower extremity venous duplex 10/23/2009    Left leg:  No DVT.     ??? Meralgia paraesthetica 10/22/2013   ??? OA (osteoarthritis of spine) 02/15/2009   ??? OA (osteoarthritis of spine) 02/15/2009   ??? Obesity, unspecified    ??? Other and unspecified hyperlipidemia    ??? Pre-operative cardiovascular examination     For spine surgery   ??? Right sided sciatica    ??? Shortness of breath     Possible asthma, HCVD; less likely CAD (Noted 03/15/09)   ??? Sleep apnea 02/15/2009    uses cpap machine   ??? Thallium stress test  03/20/2004    Partially transient, mod basal & mid anterior defect most c/w artifact; mild anterior ischemia less likely.  Neg EKG on pharm stress test.   ??? Thyroid disease     hypothyroidism     Past Surgical History:   Procedure Laterality Date   ??? HX COLONOSCOPY  02-24-12    normal (Dr. Angelique Holm)   ??? HX HERNIA REPAIR      3x   ??? HX HYSTERECTOMY     ??? HX TUBAL LIGATION     ??? NEUROLOGICAL PROCEDURE UNLISTED  03-16-2009    s/p ACD & Fusion (Dr.P.Gurtner)   ??? PR COLONOSCOPY FLX DX W/COLLJ SPEC WHEN PFRMD  02-2007    +polyp(tubular adenoma); Dr Angelique Holm     Family History   Problem Relation Age of Onset   ??? Cancer Mother    ??? Hypertension Mother    ??? Hypertension Sister          x3   ??? Heart Surgery Sister    ??? Heart Disease Sister    ??? Heart Attack Sister    ??? Cancer Maternal Aunt         breast   ??? Breast Cancer Maternal Aunt    ??? Glaucoma Son    ??? Heart defect Son    ??? Diabetes Paternal Uncle    ??? Hypertension Paternal Uncle      Social History     Tobacco Use   ??? Smoking status: Former Smoker     Packs/day: 1.00     Years: 28.00     Pack years: 28.00     Last attempt to quit: 04/25/2004     Years since quitting: 14.9   ??? Smokeless tobacco: Never Used   Substance Use Topics   ??? Alcohol use: No       ROS- Per HPI        Objective:     N/A               We discussed the expected course, resolution and complications of the diagnosis(es) in detail.  Medication risks, benefits, costs, interactions, and alternatives were discussed as indicated.  I advised her to contact the office if her condition worsens, changes or fails to improve as anticipated. She expressed understanding with the diagnosis(es) and plan.       Elaine [name] is a 68 y.o. female being evaluated  by a video visit encounter for concerns as above.  A caregiver was present when appropriate. Due to this being a Scientist, physiological (During JHERD-40 public health emergency), evaluation of the following organ systems was limited: Vitals/Constitutional/EENT/Resp/CV/GI/GU/MS/Neuro/Skin/Heme-Lymph-Imm.  Pursuant to the emergency declaration under the [place], 1135 waiver authority and the [employer] Act, this Virtual  Visit was conducted, with patient's (and/or legal guardian's) consent, to reduce the patient's risk of exposure to COVID-19 and provide necessary medical care.     Services were provided through a video synchronous discussion virtually to substitute for in-person clinic visit.         Cecil Cranker, DO, FCCP    [facility]  Pulmonary, Critical Care, and Sleep Medicine

## 2019-04-13 NOTE — Telephone Encounter (Signed)
This order was already sent to [facility]

## 2019-04-13 NOTE — Telephone Encounter (Addendum)
Elaine [name] from [facility] [phone]).  They received an order for bipap and supplies but they do not accept her insurance([employer] HMO).  Please let Dr Timmothy Sours know.

## 2019-04-14 MED ORDER — MONTELUKAST 10 MG TAB
10 mg | ORAL_TABLET | ORAL | 1 refills | Status: DC
Start: 2019-04-14 — End: 2019-07-24

## 2019-05-08 MED ORDER — LEVOTHYROXINE 150 MCG TAB
150 mcg | ORAL_TABLET | ORAL | 1 refills | Status: DC
Start: 2019-05-08 — End: 2019-07-24

## 2019-06-05 NOTE — Telephone Encounter (Signed)
Pt states [facility] has called her about her bipap, and states they are waiting on [place] b/c they need to know what kind of machine she needs.  Please find out and let pt know status asap.

## 2019-06-06 ENCOUNTER — Ambulatory Visit: Admit: 2019-06-06 | Discharge: 2019-06-06 | Payer: MEDICARE | Attending: Internal Medicine | Primary: Internal Medicine

## 2019-06-06 ENCOUNTER — Ambulatory Visit: Attending: Internal Medicine | Primary: Internal Medicine

## 2019-06-06 DIAGNOSIS — I10 Essential (primary) hypertension: Secondary | ICD-10-CM

## 2019-06-06 MED ORDER — VALSARTAN-HYDROCHLOROTHIAZIDE 160 MG-25 MG TAB
160-25 mg | ORAL_TABLET | Freq: Every day | ORAL | 3 refills | Status: DC
Start: 2019-06-06 — End: 2019-07-24

## 2019-06-06 MED ORDER — TERBINAFINE 250 MG TAB
250 mg | ORAL_TABLET | ORAL | 0 refills | Status: DC
Start: 2019-06-06 — End: 2019-07-24

## 2019-06-06 NOTE — Progress Notes (Signed)
Elaine [name] is a 68 y.o.  female and presents with Hypertension; Cholesterol Problem (f/u); Thyroid Problem; and Vitamin D Deficiency      SUBJECTIVE:  [name] HTN is uncontrolled on 1/2 tab DiovanHct 160/25   [name] high cholesterol has been borderline controlled on Lipitor 40 mg. Pt  denies any side effects like myalgias. Compliance with medications has been an issue. Will check lipid profile today to see how well controled it is.  Pt has been controlling her prediabetes with diet and weight loss.      Thyroid Review:  Patient is seen for followup of hypothyroidism.   Thyroid ROS: denies fatigue, weight changes, heat/cold intolerance, bowel/skin changes or CVS symptoms. 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 Pt  Says she has been more compliant with her synthroid so will check TSH levels to see if they are appropriate.      [name] vit D level has been well controlled on vit D 2000 units/day. Will check levels today.     Pt has a h/o sleep apnea and needs to f/u with sleep specialist for further management. She is in the process of getting her CPAP machine.     She continues to have fungal infection in her toenails which is slowly improving with Lamisil.         Respiratory ROS: negative for - shortness of breath  Cardiovascular ROS: negative for - chest pain    Current Outpatient Medications   Medication Sig   ??? valsartan-hydroCHLOROthiazide (DIOVAN-HCT) 160-25 mg per tablet Take 1 Tab by mouth daily.   ??? terbinafine HCL (LAMISIL) 250 mg tablet take 1 tablet by mouth once daily   ??? levothyroxine (SYNTHROID) 150 mcg tablet take 1 tablet by mouth once daily before breakfast   ??? montelukast (SINGULAIR) 10 mg tablet take 1 tablet by mouth at bedtime if needed   ??? Arnuity Ellipta 200 mcg/actuation dsdv inhaler inhale 1 puff by mouth and INTO THE LUNGS twice a day (FLOVENT ON BACKORDER- REPLACING FLOVENT INHALER)   ??? gabapentin  (NEURONTIN) 300 mg capsule take 1 capsule by mouth every morning and 2 at bedtime   ??? pantoprazole (PROTONIX) 40 mg tablet Take 1 Tab by mouth daily.   ??? atorvastatin (LIPITOR) 40 mg tablet take 1 tablet by mouth once daily   ??? carvediloL (COREG) 3.125 mg tablet Take 1 Tab by mouth two (2) times daily (with meals).   ??? potassium chloride (KLOR-CON) 10 mEq tablet Take 1 Tab by mouth daily.   ??? latanoprost (XALATAN) 0.005 % ophthalmic solution    ??? multivitamin (ONE A DAY) tablet Take 1 Tab by mouth daily.   ??? Cholecalciferol, Vitamin D3, (VITAMIN D-3) 5,000 unit Tab Take  by mouth daily.   ??? b complex vitamins tablet Take 1 Tab by mouth daily.     No current facility-administered medications for this visit.          OBJECTIVE:  alert, well appearing, and in no distress  Visit Vitals  BP 146/74 (BP 1 Location: Left arm, BP Patient Position: Sitting)   Pulse 80   Temp 98.8 ??F (37.1 ??C) (Oral)   Resp 20   Ht '5\' 2"'  (1.575 m)   Wt 185 lb (83.9 kg)   SpO2 95%   BMI 33.84 kg/m??      well developed and well nourished  Chest - clear to auscultation, no wheezes, rales or rhonchi, symmetric air  entry  Heart - normal rate, regular rhythm, normal S1, S2, no murmurs, rubs, clicks or gallops  Extremities - peripheral pulses normal, no pedal edema, no clubbing or cyanosis    Labs:   Lab Results   Component Value Date/Time    Cholesterol, total 197 10/10/2018 03:55 PM    HDL Cholesterol 62 (H) 10/10/2018 03:55 PM    LDL, calculated 117 (H) 10/10/2018 03:55 PM    Triglyceride 90 10/10/2018 03:55 PM    CHOL/HDL Ratio 3.2 10/10/2018 03:55 PM     Lab Results   Component Value Date/Time    Sodium 136 10/10/2018 03:55 PM    Potassium 3.5 10/10/2018 03:55 PM    Chloride 98 (L) 10/10/2018 03:55 PM    CO2 35 (H) 10/10/2018 03:55 PM    Anion gap 3 10/10/2018 03:55 PM    Glucose 125 (H) 10/10/2018 03:55 PM    BUN 10 10/10/2018 03:55 PM    Creatinine 0.83 10/10/2018 03:55 PM    BUN/Creatinine ratio 12 10/10/2018 03:55 PM    GFR est AA >60  10/10/2018 03:55 PM    GFR est non-AA >60 10/10/2018 03:55 PM    Calcium 9.5 10/10/2018 03:55 PM    Bilirubin, total 0.6 10/10/2018 03:55 PM    ALT (SGPT) 25 10/10/2018 03:55 PM    Alk. phosphatase 88 10/10/2018 03:55 PM    Protein, total 7.0 10/10/2018 03:55 PM    Albumin 3.9 10/10/2018 03:55 PM    Globulin 3.1 10/10/2018 03:55 PM    A-G Ratio 1.3 10/10/2018 03:55 PM      Lab Results   Component Value Date/Time    Hemoglobin A1c 5.7 (H) 10/10/2018 03:55 PM      Labs:   Lab Results   Component Value Date/Time    TSH 30.80 (H) 10/10/2018 03:55 PM    T4, Free 1.3 03/29/2017 05:30 PM          Discussed the patient's BMI with her.  The BMI follow up plan is as follows: I have counseled this patient on diet and exercise regimens.        Assessment/Plan      ICD-10-CM ICD-9-CM    1. HTN (hypertension), benign I10 401.1 Uncontrolled. Will check METABOLIC PANEL, COMPREHENSIVE      increase to valsartan-hydroCHLOROthiazide (DIOVAN-HCT) 160-25 mg per tablet   2. Pure hypercholesterolemia E78.00 272.0 Status unknown on Lipitor 40 mg. 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 Check TSH 3RD GENERATION   4. Vitamin D deficiency E55.9 268.9 Status unknown on vit D 2000 units/day . Will check VITAMIN D, 25 HYDROXY   5. Prediabetes R73.03 790.29 Pt is trying to control with diet HEMOGLOBIN A1C W/O EAG   6. Fungal infection of toenail B35.1 110.1 terbinafine HCL (LAMISIL) 250 mg tablet   7. Screening-pulmonary TB Z11.1 V74.1 QUANTIFERON-TB PLUS(CLIENT INCUB.)         Follow-up and Dispositions    ?? Return in about 3 months (around 09/06/2019) for labs 1 week before.           Reviewed plan of care. Patient has provided input and agrees with goals.

## 2019-06-06 NOTE — Progress Notes (Signed)
Patient is in the office today for a 6  month follow up.    1. Have you been to the ER, urgent care clinic since your last visit?  Hospitalized since your last visit? No    2. Have you seen or consulted any other health care providers outside of the [facility] since your last visit?  Include any pap smears or colon screening. Yes, Dr. Vonzell Schlatter, [place].

## 2019-06-06 NOTE — Progress Notes (Signed)
Patient is in the office today for a 6  month follow up.    1. Have you been to the ER, urgent care clinic since your last visit?  Hospitalized since your last visit? No    2. Have you seen or consulted any other health care providers outside of the [place] [facility] since your last visit?  Include any pap smears or colon screening. Yes, Dr. Vonzell Schlatter, [place].

## 2019-06-06 NOTE — Progress Notes (Signed)
Elaine [name] is a 68 y.o.  female and presents with Hypertension; Cholesterol Problem (f/u); Thyroid Problem; and Vitamin D Deficiency      SUBJECTIVE:  [name] HTN is uncontrolled on 1/2 tab DiovanHct 160/25   [name] high cholesterol has been borderline controlled on Lipitor 40 mg. Pt  denies any side effects like myalgias. Compliance with medications has been an issue. Will check lipid profile today to see how well controled it is.  Pt has been controlling her prediabetes with diet and weight loss.      Thyroid Review:  Patient is seen for followup of hypothyroidism.   Thyroid ROS: denies fatigue, weight changes, heat/cold intolerance, bowel/skin changes or CVS symptoms. 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 Pt  Says she has been more compliant with her synthroid so will check TSH levels to see if they are appropriate.      [name] vit D level has been well controlled on vit D 2000 units/day. Will check levels today.     Pt has a h/o sleep apnea and needs to f/u with sleep specialist for further management. She is in the process of getting her CPAP machine.     She continues to have fungal infection in her toenails which is slowly improving with Lamisil.         Respiratory ROS: negative for - shortness of breath  Cardiovascular ROS: negative for - chest pain    Current Outpatient Medications   Medication Sig   ??? valsartan-hydroCHLOROthiazide (DIOVAN-HCT) 160-25 mg per tablet Take 1 Tab by mouth daily.   ??? terbinafine HCL (LAMISIL) 250 mg tablet take 1 tablet by mouth once daily   ??? levothyroxine (SYNTHROID) 150 mcg tablet take 1 tablet by mouth once daily before breakfast   ??? montelukast (SINGULAIR) 10 mg tablet take 1 tablet by mouth at bedtime if needed   ??? Arnuity Ellipta 200 mcg/actuation dsdv inhaler inhale 1 puff by mouth and INTO THE LUNGS twice a day (FLOVENT ON BACKORDER- REPLACING FLOVENT INHALER)    ??? gabapentin (NEURONTIN) 300 mg capsule take 1 capsule by mouth every morning and 2 at bedtime   ??? pantoprazole (PROTONIX) 40 mg tablet Take 1 Tab by mouth daily.   ??? atorvastatin (LIPITOR) 40 mg tablet take 1 tablet by mouth once daily   ??? carvediloL (COREG) 3.125 mg tablet Take 1 Tab by mouth two (2) times daily (with meals).   ??? potassium chloride (KLOR-CON) 10 mEq tablet Take 1 Tab by mouth daily.   ??? latanoprost (XALATAN) 0.005 % ophthalmic solution    ??? multivitamin (ONE A DAY) tablet Take 1 Tab by mouth daily.   ??? Cholecalciferol, Vitamin D3, (VITAMIN D-3) 5,000 unit Tab Take  by mouth daily.   ??? b complex vitamins tablet Take 1 Tab by mouth daily.     No current facility-administered medications for this visit.          OBJECTIVE:  alert, well appearing, and in no distress  Visit Vitals  BP 146/74 (BP 1 Location: Left arm, BP Patient Position: Sitting)   Pulse 80   Temp 98.8 ??F (37.1 ??C) (Oral)   Resp 20   Ht '5\' 2"'$  (1.575 m)   Wt 185 lb (83.9 kg)   SpO2 95%   BMI 33.84 kg/m??      well developed and well nourished  Chest - clear to auscultation, no wheezes, rales or rhonchi, symmetric air  entry  Heart - normal rate, regular rhythm, normal S1, S2, no murmurs, rubs, clicks or gallops  Extremities - peripheral pulses normal, no pedal edema, no clubbing or cyanosis    Labs:   Lab Results   Component Value Date/Time    Cholesterol, total 197 10/10/2018 03:55 PM    HDL Cholesterol 62 (H) 10/10/2018 03:55 PM    LDL, calculated 117 (H) 10/10/2018 03:55 PM    Triglyceride 90 10/10/2018 03:55 PM    CHOL/HDL Ratio 3.2 10/10/2018 03:55 PM     Lab Results   Component Value Date/Time    Sodium 136 10/10/2018 03:55 PM    Potassium 3.5 10/10/2018 03:55 PM    Chloride 98 (L) 10/10/2018 03:55 PM    CO2 35 (H) 10/10/2018 03:55 PM    Anion gap 3 10/10/2018 03:55 PM    Glucose 125 (H) 10/10/2018 03:55 PM    BUN 10 10/10/2018 03:55 PM    Creatinine 0.83 10/10/2018 03:55 PM    BUN/Creatinine ratio 12 10/10/2018 03:55 PM     GFR est AA >60 10/10/2018 03:55 PM    GFR est non-AA >60 10/10/2018 03:55 PM    Calcium 9.5 10/10/2018 03:55 PM    Bilirubin, total 0.6 10/10/2018 03:55 PM    ALT (SGPT) 25 10/10/2018 03:55 PM    Alk. phosphatase 88 10/10/2018 03:55 PM    Protein, total 7.0 10/10/2018 03:55 PM    Albumin 3.9 10/10/2018 03:55 PM    Globulin 3.1 10/10/2018 03:55 PM    A-G Ratio 1.3 10/10/2018 03:55 PM      Lab Results   Component Value Date/Time    Hemoglobin A1c 5.7 (H) 10/10/2018 03:55 PM      Labs:   Lab Results   Component Value Date/Time    TSH 30.80 (H) 10/10/2018 03:55 PM    T4, Free 1.3 03/29/2017 05:30 PM          Discussed the patient's BMI with her.  The BMI follow up plan is as follows: I have counseled this patient on diet and exercise regimens.        Assessment/Plan      ICD-10-CM ICD-9-CM    1. HTN (hypertension), benign I10 401.1 Uncontrolled. Will check METABOLIC PANEL, COMPREHENSIVE      increase to valsartan-hydroCHLOROthiazide (DIOVAN-HCT) 160-25 mg per tablet   2. Pure hypercholesterolemia E78.00 272.0 Status unknown on Lipitor 40 mg. 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 Check TSH 3RD GENERATION   4. Vitamin D deficiency E55.9 268.9 Status unknown on vit D 2000 units/day . Will check VITAMIN D, 25 HYDROXY   5. Prediabetes R73.03 790.29 Pt is trying to control with diet HEMOGLOBIN A1C W/O EAG   6. Fungal infection of toenail B35.1 110.1 terbinafine HCL (LAMISIL) 250 mg tablet   7. Screening-pulmonary TB Z11.1 V74.1 QUANTIFERON-TB PLUS(CLIENT INCUB.)         Follow-up and Dispositions    ?? Return in about 3 months (around 09/06/2019) for labs 1 week before.           Reviewed plan of care. Patient has provided input and agrees with goals.

## 2019-06-06 NOTE — Telephone Encounter (Signed)
Called [facility], spoke with Mosetta Pigeon re: patient's BiPAP order. Guy Begin states they are waiting on titration study. Explained that due to COVID-19 we are unable to do titrations at this time. She states she can likely get her COVID-19 qualified through [facility] with the baseline study, but patient will need a titration study as soon as we get the go ahead to schedule.

## 2019-06-07 LAB — COMPREHENSIVE METABOLIC PANEL
ALT: 14 IU/L (ref 0–32)
AST: 14 IU/L (ref 0–40)
Albumin/Globulin Ratio: 1.5 NA (ref 1.2–2.2)
Albumin: 3.8 g/dL (ref 3.8–4.8)
Alkaline Phosphatase: 77 IU/L (ref 39–117)
BUN/Creatinine Ratio: 17 NA (ref 12–28)
BUN: 9 mg/dL (ref 8–27)
CO2: 25 mmol/L (ref 20–29)
Calcium: 9.5 mg/dL (ref 8.7–10.3)
Chloride: 102 mmol/L (ref 96–106)
Creatinine: 0.54 mg/dL — ABNORMAL LOW (ref 0.57–1.00)
GFR African American: 111 mL/min/{1.73_m2} (ref >59)
Globulin, Total: 2.6 g/dL (ref 1.5–4.5)
Glucose: 104 mg/dL — ABNORMAL HIGH (ref 65–99)
Potassium: 4.1 mmol/L (ref 3.5–5.2)
Sodium: 141 mmol/L (ref 134–144)
Total Bilirubin: 0.4 mg/dL (ref 0.0–1.2)
Total Protein: 6.4 g/dL (ref 6.0–8.5)
eGFR NON-AA: 97 mL/min/{1.73_m2} (ref >59)

## 2019-06-07 LAB — CVD REPORT

## 2019-06-07 LAB — LIPID PANEL
Cholesterol, Total: 175 mg/dL (ref 100–199)
Cholesterol, total: 175 mg/dL (ref 100–199)
HDL Cholesterol: 57 mg/dL (ref >39)
HDL: 57 mg/dL (ref >39)
LDL Calculated: 104 mg/dL — ABNORMAL HIGH (ref 0–99)
LDL, calculated: 104 mg/dL — ABNORMAL HIGH (ref 0–99)
Triglyceride: 69 mg/dL (ref 0–149)
Triglycerides: 69 mg/dL (ref 0–149)
VLDL Cholesterol Calculated: 14 mg/dL (ref 5–40)
VLDL, calculated: 14 mg/dL (ref 5–40)

## 2019-06-07 LAB — VITAMIN D 25 HYDROXY: Vit D, 25-Hydroxy: 58.5 ng/mL (ref 30.0–100.0)

## 2019-06-07 LAB — HEMOGLOBIN A1C W/O EAG
Hemoglobin A1C: 5.7 % — ABNORMAL HIGH (ref 4.8–5.6)
Hemoglobin A1c: 5.7 % — ABNORMAL HIGH (ref 4.8–5.6)

## 2019-06-07 LAB — TSH 3RD GENERATION
TSH: 0.14 u[IU]/mL — ABNORMAL LOW (ref 0.450–4.500)
TSH: 0.14 u[IU]/mL — ABNORMAL LOW (ref 0.450–4.500)

## 2019-06-07 LAB — METABOLIC PANEL, COMPREHENSIVE
A-G Ratio: 1.5 (ref 1.2–2.2)
ALT (SGPT): 14 IU/L (ref 0–32)
AST (SGOT): 14 IU/L (ref 0–40)
Albumin: 3.8 g/dL (ref 3.8–4.8)
Alk. phosphatase: 77 IU/L (ref 39–117)
BUN/Creatinine ratio: 17 (ref 12–28)
BUN: 9 mg/dL (ref 8–27)
Bilirubin, total: 0.4 mg/dL (ref 0.0–1.2)
CO2: 25 mmol/L (ref 20–29)
Calcium: 9.5 mg/dL (ref 8.7–10.3)
Chloride: 102 mmol/L (ref 96–106)
Creatinine: 0.54 mg/dL — ABNORMAL LOW (ref 0.57–1.00)
GFR est AA: 111 mL/min/{1.73_m2} (ref >59)
GFR est non-AA: 97 mL/min/{1.73_m2} (ref >59)
GLOBULIN, TOTAL: 2.6 g/dL (ref 1.5–4.5)
Glucose: 104 mg/dL — ABNORMAL HIGH (ref 65–99)
Potassium: 4.1 mmol/L (ref 3.5–5.2)
Protein, total: 6.4 g/dL (ref 6.0–8.5)
Sodium: 141 mmol/L (ref 134–144)

## 2019-06-07 LAB — VITAMIN D, 25 HYDROXY: VITAMIN D, 25-HYDROXY: 58.5 ng/mL (ref 30.0–100.0)

## 2019-06-09 LAB — QUANTIFERON-TB PLUS(CLIENT INCUB.)
QuantiFERON Mitogen Value: >10 IU/mL
QuantiFERON Mitogen Value: >10 IU/mL
QuantiFERON Nil Value: 0.01 IU/mL
QuantiFERON Nil Value: 0.01 IU/mL
QuantiFERON Plus: NEGATIVE
QuantiFERON TB1 Ag Value: 0.01 [IU]/mL
QuantiFERON TB1 Ag: 0.01 IU/mL
QuantiFERON TB2 Ag Value: 0.01 [IU]/mL
QuantiFERON TB2 Ag: 0.01 IU/mL
Quantiferon TB Gold Plus: NEGATIVE

## 2019-06-12 LAB — COVID-19: SARS-CoV-2, NAA: NOT DETECTED

## 2019-06-12 LAB — NOVEL CORONAVIRUS (COVID-19): SARS-CoV-2, NAA: NOT DETECTED

## 2019-06-14 ENCOUNTER — Encounter

## 2019-06-15 MED ORDER — POTASSIUM CHLORIDE SR 10 MEQ TAB, PARTICLES/CRYSTALS
10 mEq | ORAL_TABLET | ORAL | 3 refills | Status: DC
Start: 2019-06-15 — End: 2020-05-15

## 2019-06-23 ENCOUNTER — Encounter: Attending: Specialist | Primary: Internal Medicine

## 2019-06-23 ENCOUNTER — Ambulatory Visit: Admit: 2019-06-23 | Discharge: 2019-06-23 | Payer: MEDICARE | Attending: Specialist | Primary: Internal Medicine

## 2019-06-23 ENCOUNTER — Ambulatory Visit: Attending: Specialist | Primary: Internal Medicine

## 2019-06-23 DIAGNOSIS — I5032 Chronic diastolic (congestive) heart failure: Secondary | ICD-10-CM

## 2019-06-23 NOTE — Progress Notes (Signed)
Patient brought medications list.    1. Have you been to the ER, urgent care clinic since your last visit?  Hospitalized since your last visit?     No    2. Have you seen or consulted any other health care providers outside of the [facility] since your last visit?  Include any pap smears or colon screening.      No     3.  Since your last visit, have you had any of the following symptoms?      chest pains and shortness of breath.         4.  Have you had any blood work, X-rays or cardiac testing?      Yes Where: [facility] Reason for visit: Labs     Requested: NO     In [facility]: NO    5.  Where do you normally have your labs drawn?   [facility]    6. Do you need any refills today?   No

## 2019-06-23 NOTE — Progress Notes (Signed)
HISTORY OF PRESENT ILLNESS  Elaine [name] is a 68 y.o. female.    05/2018 - Mild SOB today related to asthma - improved with inhaler use.    CHF   Associated symptoms include shortness of breath.   Hypertension   The history is provided by the medical records. This is a chronic problem. Associated symptoms include shortness of breath.   Cholesterol Problem   The history is provided by the medical records. This is a chronic problem. Associated symptoms include shortness of breath.   Shortness of Breath   The history is provided by the patient. This is a new problem. The problem occurs intermittently.The current episode started more than 1 week ago (9/17). The problem has not changed since onset.Pertinent negatives include no fever, no cough, no wheezing, no PND, no orthopnea, no vomiting, no rash, no leg swelling and no claudication. The problem's precipitants include exercise (steps in a hurry; ).       Review of Systems   Constitutional: Negative for chills, fever, malaise/fatigue and weight loss.   HENT: Negative for nosebleeds.    Eyes: Negative for discharge.   Respiratory: Positive for shortness of breath. Negative for cough and wheezing.    Cardiovascular: Negative for palpitations, orthopnea, claudication, leg swelling and PND.   Gastrointestinal: Negative for diarrhea, nausea and vomiting.   Genitourinary: Negative for dysuria and hematuria.   Musculoskeletal: Negative for joint pain.   Skin: Negative for rash.   Neurological: Negative for dizziness, seizures and loss of consciousness.   Endo/Heme/Allergies: Negative for polydipsia. Does not bruise/bleed easily.   Psychiatric/Behavioral: Negative for depression and substance abuse. The patient does not have insomnia.      Allergies   Allergen Reactions   ??? Asa-Acetaminophen-Caff-Potass Other (comments)     Bleeding in stomach   ??? Benadryl [Diphenhydramine Hcl] Other (comments)     Muscle jerking   ??? Other Plant, Animal, Environmental Not Reported This Time      Grass, Dust and Mold   ??? Pollen Extracts Not Reported This Time       Past Medical History:   Diagnosis Date   ??? Anemia    ??? Asthma    ??? Colon polyp    ??? DDD (degenerative disc disease) 02/15/2009   ??? Depression 12/18/2011   ??? Essential hypertension, benign    ??? History of echocardiogram 03/20/2004    EF 70%.  Borderline DDfx.  No significant valvular pathology.   ??? Hypercholesterolemia    ??? Hypertension    ??? Iron deficiency anemia 03/15/8118   ??? Lichen planus 1/47/8295   ??? Lower extremity venous duplex 10/23/2009    Left leg:  No DVT.     ??? Meralgia paraesthetica 10/22/2013   ??? OA (osteoarthritis of spine) 02/15/2009   ??? OA (osteoarthritis of spine) 02/15/2009   ??? Obesity, unspecified    ??? Other and unspecified hyperlipidemia    ??? Pre-operative cardiovascular examination     For spine surgery   ??? Right sided sciatica    ??? Shortness of breath     Possible asthma, HCVD; less likely CAD (Noted 03/15/09)   ??? Sleep apnea 02/15/2009    uses cpap machine   ??? Thallium stress test  03/20/2004    Partially transient, mod basal & mid anterior defect most c/w artifact; mild anterior ischemia less likely.  Neg EKG on pharm stress test.   ??? Thyroid disease     hypothyroidism       Family History  Problem Relation Age of Onset   ??? Cancer Mother    ??? Hypertension Mother    ??? Hypertension Sister         x3   ??? Heart Surgery Sister    ??? Heart Disease Sister    ??? Heart Attack Sister    ??? Cancer Maternal Aunt         breast   ??? Breast Cancer Maternal Aunt    ??? Glaucoma Son    ??? Heart defect Son    ??? Diabetes Paternal Uncle    ??? Hypertension Paternal Uncle        Social History     Tobacco Use   ??? Smoking status: Former Smoker     Packs/day: 1.00     Years: 28.00     Pack years: 28.00     Last attempt to quit: 04/25/2004     Years since quitting: 15.1   ??? Smokeless tobacco: Never Used   Substance Use Topics   ??? Alcohol use: No   ??? Drug use: No        Current Outpatient Medications   Medication Sig   ??? albuterol (PROVENTIL HFA, VENTOLIN  HFA, PROAIR HFA) 90 mcg/actuation inhaler Take 2 Puffs by inhalation every four (4) hours as needed for Wheezing.   ??? omeprazole (PRILOSEC) 20 mg capsule Take 20 mg by mouth two (2) times a day.   ??? potassium chloride (KLOR-CON) 10 mEq tablet take 1 tablet by mouth once daily   ??? valsartan-hydroCHLOROthiazide (DIOVAN-HCT) 160-25 mg per tablet Take 1 Tab by mouth daily.   ??? terbinafine HCL (LAMISIL) 250 mg tablet take 1 tablet by mouth once daily   ??? levothyroxine (SYNTHROID) 150 mcg tablet take 1 tablet by mouth once daily before breakfast   ??? montelukast (SINGULAIR) 10 mg tablet take 1 tablet by mouth at bedtime if needed (Patient taking differently: Take 10 mg by mouth nightly. TAKE 1 TABLET BY MOUTH AT BEDTIME IF NEEDED)   ??? gabapentin (NEURONTIN) 300 mg capsule take 1 capsule by mouth every morning and 2 at bedtime   ??? atorvastatin (LIPITOR) 40 mg tablet take 1 tablet by mouth once daily   ??? carvediloL (COREG) 3.125 mg tablet Take 1 Tab by mouth two (2) times daily (with meals). (Patient taking differently: Take 6.25 mg by mouth two (2) times daily (with meals).)   ??? latanoprost (XALATAN) 0.005 % ophthalmic solution Administer 1 Drop to both eyes nightly.   ??? multivitamin (ONE A DAY) tablet Take 1 Tab by mouth daily.   ??? Cholecalciferol, Vitamin D3, (VITAMIN D-3) 5,000 unit Tab Take  by mouth daily.     No current facility-administered medications for this visit.         Past Surgical History:   Procedure Laterality Date   ??? HX COLONOSCOPY  02-24-12    normal (Dr. Angelique Holm)   ??? HX HERNIA REPAIR      3x   ??? HX HYSTERECTOMY     ??? HX TUBAL LIGATION     ??? NEUROLOGICAL PROCEDURE UNLISTED  03-16-2009    s/p ACD & Fusion (Dr.P.Gurtner)   ??? PR COLONOSCOPY FLX DX W/COLLJ SPEC WHEN PFRMD  02-2007    +polyp(tubular adenoma); Dr Angelique Holm       Visit Vitals  BP 139/48 (BP 1 Location: Left arm, BP Patient Position: Sitting)   Pulse 81   Temp 97.7 ??F (36.5 ??C) (Temporal)   Ht 5\' 2"  (1.575 m)   Wt 84.4  kg (186 lb)   SpO2 96%   BMI  34.02 kg/m??       Diagnostic Studies:  I have reviewed the relevant tests done on the patient and show as follows  EKG tracings reviewed by me today.    No flowsheet data found.  1/18 Nuc Stress  Diagnosis:   1. Probably normal scan.    2. Evidence of a small mild fixed inferior wall defect noted from his nuclear study suggestive of tissue attenuation with normal wall motion in the area.    3. No reversible defect suggesting ischemia noted from his nuclear study.   4. Normal left ventricular size and systolic function.   5. Low risk scan.     1/18 ECHO  SUMMARY:  Left ventricle: Systolic function was hyperdynamic. Ejection fraction was  estimated in the range of 70 % to 75 %. There were no regional wall motion  abnormalities. There was mild concentric hypertrophy. Doppler parameters  were consistent with abnormal left ventricular relaxation (grade 1  diastolic dysfunction).    Mitral valve: There was mild annular calcification.    Elaine [name] has a reminder for a "due or due soon" health maintenance. I have asked that she contact her primary care provider for follow-up on this health maintenance.    Physical Exam   Constitutional: She is oriented to person, place, and time. She appears well-developed and well-nourished. No distress.   sev obese   HENT:   Head: Normocephalic and atraumatic.   Mouth/Throat: Normal dentition.   Eyes: Right eye exhibits no discharge. Left eye exhibits no discharge. No scleral icterus.   Neck: Neck supple. No JVD present. Carotid bruit is not present. No thyromegaly present.   Cardiovascular: Normal rate, regular rhythm, S1 normal, S2 normal, normal heart sounds and intact distal pulses. Exam reveals no gallop and no friction rub.   No murmur heard.  Pulmonary/Chest: Effort normal and breath sounds normal. She has no wheezes. She has no rales.   Abdominal: Soft. She exhibits no mass. There is no abdominal tenderness.   Musculoskeletal:         General: No edema.   Lymphadenopathy:         Right cervical: No superficial cervical adenopathy present.       Left cervical: No superficial cervical adenopathy present.   Neurological: She is alert and oriented to person, place, and time.   Skin: Skin is warm and dry. No rash noted.   Psychiatric: She has a normal mood and affect. Her behavior is normal.     Results for SIMORA, DINGEE (MRN [id number]) as of 05/09/2018 15:04   Ref. Range 03/07/2018 17:10   Triglyceride Latest Ref Range: <150 MG/DL 115   Cholesterol, total Latest Ref Range: <200 MG/DL 161   HDL Cholesterol Latest Ref Range: 40 - 60 MG/DL 56   CHOL/HDL Ratio Latest Ref Range: 0 - 5.0   2.9   LDL, calculated Latest Ref Range: 0 - 100 MG/DL 82   VLDL, calculated Latest Units: MG/DL 23     ASSESSMENT and PLAN    HLD : Results for DELAINY, MCELHINEY (MRN [id number]) as of 06/23/2019 13:04   Ref. Range 10/10/2018 15:55 10/28/2018 10:48 02/21/2019 17:13 06/06/2019 09:38   Triglyceride Latest Ref Range: 0 - 149 mg/dL 90   69   Cholesterol, total Latest Ref Range: 100 - 199 mg/dL 197   175   HDL Cholesterol Latest Ref Range: >39 mg/dL 62 (H)   57  CHOL/HDL Ratio Latest Ref Range: 0 - 5.0   3.2      VLDL, calculated Latest Ref Range: 5 - 40 mg/dL 18   14   LDL, calculated Latest Ref Range: 0 - 99 mg/dL 117 (H)   104 (H)     Stable CHF.  B/P is controlled.  Continue same medications.  NYHA II.  Weight diet and exercise discussed.      Diagnoses and all orders for this visit:    1. Chronic diastolic congestive heart failure ([place])    2. HTN (hypertension), benign  -     AMB POC EKG ROUTINE W/ 12 LEADS, INTER & REP    3. Pure hypercholesterolemia    4. Obesity (BMI 30-39.9)        Pertinent laboratory and test data reviewed and discussed with patient.  See patient instructions also for other medical advice given    There are no discontinued medications.    Follow-up and Dispositions    ?? Return in about 1 year (around 06/22/2020), or if symptoms worsen or fail to improve, for with ekg.

## 2019-06-23 NOTE — Telephone Encounter (Signed)
Spoke with someone at [facility] sleep therapy intake dept. She stated they sent the pt paperwork on 06/12/2019, because they her signature on some documents. After they receive the documents back, patient will be ready for set up.

## 2019-06-23 NOTE — Progress Notes (Signed)
Patient brought medications list.    1. Have you been to the ER, urgent care clinic since your last visit?  Hospitalized since your last visit?     No    2. Have you seen or consulted any other health care providers outside of the [facility] since your last visit?  Include any pap smears or colon screening.      No     3.  Since your last visit, have you had any of the following symptoms?      chest pains and shortness of breath.         4.  Have you had any blood work, X-rays or cardiac testing?      Yes Where: [facility] Reason for visit: Labs     Requested: NO     In [facility]: NO    5.  Where do you normally have your labs drawn?   [facility]    6. Do you need any refills today?   No

## 2019-06-23 NOTE — Progress Notes (Signed)
HISTORY OF PRESENT ILLNESS  Elaine [name] is a 68 y.o. female.    05/2018 - Mild SOB today related to asthma - improved with inhaler use.    CHF   Associated symptoms include shortness of breath.   Hypertension   The history is provided by the medical records. This is a chronic problem. Associated symptoms include shortness of breath.   Cholesterol Problem   The history is provided by the medical records. This is a chronic problem. Associated symptoms include shortness of breath.   Shortness of Breath   The history is provided by the patient. This is a new problem. The problem occurs intermittently.The current episode started more than 1 week ago (9/17). The problem has not changed since onset.Pertinent negatives include no fever, no cough, no wheezing, no PND, no orthopnea, no vomiting, no rash, no leg swelling and no claudication. The problem's precipitants include exercise (steps in a hurry; ).       Review of Systems   Constitutional: Negative for chills, fever, malaise/fatigue and weight loss.   HENT: Negative for nosebleeds.    Eyes: Negative for discharge.   Respiratory: Positive for shortness of breath. Negative for cough and wheezing.    Cardiovascular: Negative for palpitations, orthopnea, claudication, leg swelling and PND.   Gastrointestinal: Negative for diarrhea, nausea and vomiting.   Genitourinary: Negative for dysuria and hematuria.   Musculoskeletal: Negative for joint pain.   Skin: Negative for rash.   Neurological: Negative for dizziness, seizures and loss of consciousness.   Endo/Heme/Allergies: Negative for polydipsia. Does not bruise/bleed easily.   Psychiatric/Behavioral: Negative for depression and substance abuse. The patient does not have insomnia.      Allergies   Allergen Reactions   ??? Asa-Acetaminophen-Caff-Potass Other (comments)     Bleeding in stomach   ??? Benadryl [Diphenhydramine Hcl] Other (comments)     Muscle jerking    ??? Other Plant, Animal, Environmental Not Reported This Time     Grass, Dust and Mold   ??? Pollen Extracts Not Reported This Time       Past Medical History:   Diagnosis Date   ??? Anemia    ??? Asthma    ??? Colon polyp    ??? DDD (degenerative disc disease) 02/15/2009   ??? Depression 12/18/2011   ??? Essential hypertension, benign    ??? History of echocardiogram 03/20/2004    EF 70%.  Borderline DDfx.  No significant valvular pathology.   ??? Hypercholesterolemia    ??? Hypertension    ??? Iron deficiency anemia 02/27/5572   ??? Lichen planus 01/26/2541   ??? Lower extremity venous duplex 10/23/2009    Left leg:  No DVT.     ??? Meralgia paraesthetica 10/22/2013   ??? OA (osteoarthritis of spine) 02/15/2009   ??? OA (osteoarthritis of spine) 02/15/2009   ??? Obesity, unspecified    ??? Other and unspecified hyperlipidemia    ??? Pre-operative cardiovascular examination     For spine surgery   ??? Right sided sciatica    ??? Shortness of breath     Possible asthma, HCVD; less likely CAD (Noted 03/15/09)   ??? Sleep apnea 02/15/2009    uses cpap machine   ??? Thallium stress test  03/20/2004    Partially transient, mod basal & mid anterior defect most c/w artifact; mild anterior ischemia less likely.  Neg EKG on pharm stress test.   ??? Thyroid disease     hypothyroidism       Family History  Problem Relation Age of Onset   ??? Cancer Mother    ??? Hypertension Mother    ??? Hypertension Sister         x3   ??? Heart Surgery Sister    ??? Heart Disease Sister    ??? Heart Attack Sister    ??? Cancer Maternal Aunt         breast   ??? Breast Cancer Maternal Aunt    ??? Glaucoma Son    ??? Heart defect Son    ??? Diabetes Paternal Uncle    ??? Hypertension Paternal Uncle        Social History     Tobacco Use   ??? Smoking status: Former Smoker     Packs/day: 1.00     Years: 28.00     Pack years: 28.00     Last attempt to quit: 04/25/2004     Years since quitting: 15.1   ??? Smokeless tobacco: Never Used   Substance Use Topics   ??? Alcohol use: No   ??? Drug use: No         Current Outpatient Medications   Medication Sig   ??? albuterol (PROVENTIL HFA, VENTOLIN HFA, PROAIR HFA) 90 mcg/actuation inhaler Take 2 Puffs by inhalation every four (4) hours as needed for Wheezing.   ??? omeprazole (PRILOSEC) 20 mg capsule Take 20 mg by mouth two (2) times a day.   ??? potassium chloride (KLOR-CON) 10 mEq tablet take 1 tablet by mouth once daily   ??? valsartan-hydroCHLOROthiazide (DIOVAN-HCT) 160-25 mg per tablet Take 1 Tab by mouth daily.   ??? terbinafine HCL (LAMISIL) 250 mg tablet take 1 tablet by mouth once daily   ??? levothyroxine (SYNTHROID) 150 mcg tablet take 1 tablet by mouth once daily before breakfast   ??? montelukast (SINGULAIR) 10 mg tablet take 1 tablet by mouth at bedtime if needed (Patient taking differently: Take 10 mg by mouth nightly. TAKE 1 TABLET BY MOUTH AT BEDTIME IF NEEDED)   ??? gabapentin (NEURONTIN) 300 mg capsule take 1 capsule by mouth every morning and 2 at bedtime   ??? atorvastatin (LIPITOR) 40 mg tablet take 1 tablet by mouth once daily   ??? carvediloL (COREG) 3.125 mg tablet Take 1 Tab by mouth two (2) times daily (with meals). (Patient taking differently: Take 6.25 mg by mouth two (2) times daily (with meals).)   ??? latanoprost (XALATAN) 0.005 % ophthalmic solution Administer 1 Drop to both eyes nightly.   ??? multivitamin (ONE A DAY) tablet Take 1 Tab by mouth daily.   ??? Cholecalciferol, Vitamin D3, (VITAMIN D-3) 5,000 unit Tab Take  by mouth daily.     No current facility-administered medications for this visit.         Past Surgical History:   Procedure Laterality Date   ??? HX COLONOSCOPY  02-24-12    normal (Dr. Angelique Holm)   ??? HX HERNIA REPAIR      3x   ??? HX HYSTERECTOMY     ??? HX TUBAL LIGATION     ??? NEUROLOGICAL PROCEDURE UNLISTED  03-16-2009    s/p ACD & Fusion (Dr.P.Gurtner)   ??? PR COLONOSCOPY FLX DX W/COLLJ SPEC WHEN PFRMD  02-2007    +polyp(tubular adenoma); Dr Angelique Holm       Visit Vitals  BP 139/48 (BP 1 Location: Left arm, BP Patient Position: Sitting)   Pulse 81    Temp 97.7 ??F (36.5 ??C) (Temporal)   Ht 5\' 2"  (1.575 m)   Wt  84.4 kg (186 lb)   SpO2 96%   BMI 34.02 kg/m??       Diagnostic Studies:  I have reviewed the relevant tests done on the patient and show as follows  EKG tracings reviewed by me today.    No flowsheet data found.  1/18 Nuc Stress  Diagnosis:   1. Probably normal scan.    2. Evidence of a small mild fixed inferior wall defect noted from his nuclear study suggestive of tissue attenuation with normal wall motion in the area.    3. No reversible defect suggesting ischemia noted from his nuclear study.   4. Normal left ventricular size and systolic function.   5. Low risk scan.     1/18 ECHO  SUMMARY:  Left ventricle: Systolic function was hyperdynamic. Ejection fraction was  estimated in the range of 70 % to 75 %. There were no regional wall motion  abnormalities. There was mild concentric hypertrophy. Doppler parameters  were consistent with abnormal left ventricular relaxation (grade 1  diastolic dysfunction).    Mitral valve: There was mild annular calcification.    Ms. Switalski has a reminder for a "due or due soon" health maintenance. I have asked that she contact her primary care provider for follow-up on this health maintenance.    Physical Exam   Constitutional: She is oriented to person, place, and time. She appears well-developed and well-nourished. No distress.   sev obese   HENT:   Head: Normocephalic and atraumatic.   Mouth/Throat: Normal dentition.   Eyes: Right eye exhibits no discharge. Left eye exhibits no discharge. No scleral icterus.   Neck: Neck supple. No JVD present. Carotid bruit is not present. No thyromegaly present.   Cardiovascular: Normal rate, regular rhythm, S1 normal, S2 normal, normal heart sounds and intact distal pulses. Exam reveals no gallop and no friction rub.   No murmur heard.  Pulmonary/Chest: Effort normal and breath sounds normal. She has no wheezes. She has no rales.    Abdominal: Soft. She exhibits no mass. There is no abdominal tenderness.   Musculoskeletal:         General: No edema.   Lymphadenopathy:        Right cervical: No superficial cervical adenopathy present.       Left cervical: No superficial cervical adenopathy present.   Neurological: She is alert and oriented to person, place, and time.   Skin: Skin is warm and dry. No rash noted.   Psychiatric: She has a normal mood and affect. Her behavior is normal.     Results for BAILA, ROUSE (MRN [id number]) as of 05/09/2018 15:04   Ref. Range 03/07/2018 17:10   Triglyceride Latest Ref Range: <150 MG/DL 115   Cholesterol, total Latest Ref Range: <200 MG/DL 161   HDL Cholesterol Latest Ref Range: 40 - 60 MG/DL 56   CHOL/HDL Ratio Latest Ref Range: 0 - 5.0   2.9   LDL, calculated Latest Ref Range: 0 - 100 MG/DL 82   VLDL, calculated Latest Units: MG/DL 23     ASSESSMENT and PLAN    HLD : Results for NATURE, KUEKER (MRN [id number]) as of 06/23/2019 13:04   Ref. Range 10/10/2018 15:55 10/28/2018 10:48 02/21/2019 17:13 06/06/2019 09:38   Triglyceride Latest Ref Range: 0 - 149 mg/dL 90   69   Cholesterol, total Latest Ref Range: 100 - 199 mg/dL 197   175   HDL Cholesterol Latest Ref Range: >39 mg/dL 62 (H)   57  CHOL/HDL Ratio Latest Ref Range: 0 - 5.0   3.2      VLDL, calculated Latest Ref Range: 5 - 40 mg/dL 18   14   LDL, calculated Latest Ref Range: 0 - 99 mg/dL 117 (H)   104 (H)     Stable CHF.  B/P is controlled.  Continue same medications.  NYHA II.  Weight diet and exercise discussed.      Diagnoses and all orders for this visit:    1. Chronic diastolic congestive heart failure ([place])    2. HTN (hypertension), benign  -     AMB POC EKG ROUTINE W/ 12 LEADS, INTER & REP    3. Pure hypercholesterolemia    4. Obesity (BMI 30-39.9)        Pertinent laboratory and test data reviewed and discussed with patient.  See patient instructions also for other medical advice given    There are no discontinued medications.     Follow-up and Dispositions    ?? Return in about 1 year (around 06/22/2020), or if symptoms worsen or fail to improve, for with ekg.

## 2019-06-23 NOTE — Patient Instructions (Signed)
There are no discontinued medications.         High Blood Pressure: Care Instructions  Overview     It's normal for blood pressure to go up and down throughout the day. But if it stays up, you have high blood pressure. Another name for high blood pressure is hypertension.  Despite what a lot of people think, high blood pressure usually doesn't cause headaches or make you feel dizzy or lightheaded. It usually has no symptoms. But it does increase your risk of stroke, heart attack, and other problems. You and your doctor will talk about your risks of these problems based on your blood pressure.  Your doctor will give you a goal for your blood pressure. Your goal will be based on your health and your age.  Lifestyle changes, such as eating healthy and being active, are always important to help lower blood pressure. You might also take medicine to reach your blood pressure goal.  Follow-up care is a key part of your treatment and safety. Be sure to make and go to all appointments, and call your doctor if you are having problems. It's also a good idea to know your test results and keep a list of the medicines you take.  How can you care for yourself at home?  Medical treatment  ?? If you stop taking your medicine, your blood pressure will go back up. You may take one or more types of medicine to lower your blood pressure. Be safe with medicines. Take your medicine exactly as prescribed. Call your doctor if you think you are having a problem with your medicine.  ?? Talk to your doctor before you start taking aspirin every day. Aspirin can help certain people lower their risk of a heart attack or stroke. But taking aspirin isn't right for everyone, because it can cause serious bleeding.  ?? See your doctor regularly. You may need to see the doctor more often at first or until your blood pressure comes down.  ?? If you are taking blood pressure medicine, talk to your doctor before  you take decongestants or anti-inflammatory medicine, such as ibuprofen. Some of these medicines can raise blood pressure.  ?? Learn how to check your blood pressure at home.  Lifestyle changes  ?? Stay at a healthy weight. This is especially important if you put on weight around the waist. Losing even 10 pounds can help you lower your blood pressure.  ?? If your doctor recommends it, get more exercise. Walking is a good choice. Bit by bit, increase the amount you walk every day. Try for at least 30 minutes on most days of the week. You also may want to swim, bike, or do other activities.  ?? Avoid or limit alcohol. Talk to your doctor about whether you can drink any alcohol.  ?? Try to limit how much sodium you eat to less than 2,300 milligrams (mg) a day. Your doctor may ask you to try to eat less than 1,500 mg a day.  ?? Eat plenty of fruits (such as bananas and oranges), vegetables, legumes, whole grains, and low-fat dairy products.  ?? Lower the amount of saturated fat in your diet. Saturated fat is found in animal products such as milk, cheese, and meat. Limiting these foods may help you lose weight and also lower your risk for heart disease.  ?? Do not smoke. Smoking increases your risk for heart attack and stroke. If you need help quitting, talk to your doctor about stop-smoking  programs and medicines. These can increase your chances of quitting for good.  When should you call for help?   Call  911 anytime you think you may need emergency care. This may mean having symptoms that suggest that your blood pressure is causing a serious heart or blood vessel problem. Your blood pressure may be over 180/120.  For example, call 911 if:  ?? You have symptoms of a heart attack. These may include:  ? Chest pain or pressure, or a strange feeling in the chest.  ? Sweating.  ? Shortness of breath.  ? Nausea or vomiting.  ? Pain, pressure, or a strange feeling in the back, neck, jaw, or upper  belly or in one or both shoulders or arms.  ? Lightheadedness or sudden weakness.  ? A fast or irregular heartbeat.  ?? You have symptoms of a stroke. These may include:  ? Sudden numbness, tingling, weakness, or loss of movement in your face, arm, or leg, especially on only one side of your body.  ? Sudden vision changes.  ? Sudden trouble speaking.  ? Sudden confusion or trouble understanding simple statements.  ? Sudden problems with walking or balance.  ? A sudden, severe headache that is different from past headaches.  ?? You have severe back or belly pain.  Do not wait until your blood pressure comes down on its own. Get help right away.  Call your doctor now or seek immediate care if:  ?? Your blood pressure is much higher than normal (such as 180/120 or higher), but you don't have symptoms.  ?? You think high blood pressure is causing symptoms, such as:  ? Severe headache.  ? Blurry vision.  Watch closely for changes in your health, and be sure to contact your doctor if:  ?? Your blood pressure measures higher than your doctor recommends at least 2 times. That means the top number is higher or the bottom number is higher, or both.  ?? You think you may be having side effects from your blood pressure medicine.  Where can you learn more?  Go to [employer]  Enter [phone] in the search box to learn more about "High Blood Pressure: Care Instructions."  Current as of: November 21, 2018??????????????????????????????Content Version: 12.5  ?? 2006-2020 [employer].   Care instructions adapted under license by [employer] (which disclaims liability or warranty for this information). If you have questions about a medical condition or this instruction, always ask your healthcare professional. [place] any warranty or liability for your use of this information.

## 2019-06-26 ENCOUNTER — Inpatient Hospital Stay: Payer: MEDICARE

## 2019-06-26 MED ORDER — LACTATED RINGERS IV
INTRAVENOUS | Status: DC | PRN
Start: 2019-06-26 — End: 2019-06-26
  Administered 2019-06-26: 13:00:00 via INTRAVENOUS

## 2019-06-26 MED ORDER — PROPOFOL 10 MG/ML IV EMUL
10 mg/mL | INTRAVENOUS | Status: DC | PRN
Start: 2019-06-26 — End: 2019-06-26
  Administered 2019-06-26: 13:00:00 via INTRAVENOUS

## 2019-06-26 MED ORDER — PROPOFOL 10 MG/ML IV EMUL
10 mg/mL | INTRAVENOUS | Status: DC | PRN
Start: 2019-06-26 — End: 2019-06-26
  Administered 2019-06-26 (×4): via INTRAVENOUS

## 2019-06-26 MED ORDER — LIDOCAINE (PF) 20 MG/ML (2 %) IJ SOLN
20 mg/mL (2 %) | INTRAMUSCULAR | Status: DC | PRN
Start: 2019-06-26 — End: 2019-06-26
  Administered 2019-06-26: 13:00:00 via INTRAVENOUS

## 2019-06-26 NOTE — Interval H&P Note (Signed)
Discharge instructions reviewed with patient and sister, Tamela Oddi, via phone. Verbalized understanding, denied any questions. Written copy of instructions sent with family.

## 2019-06-26 NOTE — H&P (Signed)
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          Evaluation of past illnesses, surgeries, or injuries:   YES     Past Medical History:        Diagnosis  Date         ?  Anemia       ?  Asthma       ?  Colon polyp       ?  DDD (degenerative disc disease)  02/15/2009     ?  Depression  12/18/2011     ?  Essential hypertension, benign       ?  History of echocardiogram  03/20/2004          EF 70%.  Borderline DDfx.  No significant valvular pathology.         ?  Hypercholesterolemia       ?  Hypertension       ?  Iron deficiency anemia  02/15/2009     ?  Lichen planus  2/70/3500     ?  Lower extremity venous duplex  10/23/2009          Left leg:  No DVT.           ?  Meralgia paraesthetica  10/22/2013     ?  OA (osteoarthritis of spine)  02/15/2009     ?  OA (osteoarthritis of spine)  02/15/2009     ?  Obesity, unspecified       ?  Other and unspecified hyperlipidemia       ?  Pre-operative cardiovascular examination            For spine surgery         ?  Right sided sciatica       ?  Shortness of breath            Possible asthma, HCVD; less likely CAD (Noted 03/15/09)         ?  Sleep apnea  02/15/2009          uses cpap machine         ?  Thallium stress test   03/20/2004          Partially transient, mod basal & mid anterior defect most c/w artifact; mild anterior ischemia less likely.  Neg EKG on pharm stress test.         ?  Thyroid disease            hypothyroidism          Past Surgical History:  Procedure   Laterality  Date          ?  HX COLONOSCOPY    02-24-12          normal (Dr. Angelique Holm)          ?  HX HERNIA REPAIR              3x          ?  HX HYSTERECTOMY         ?  HX TUBAL LIGATION         ?  NEUROLOGICAL PROCEDURE UNLISTED    03-16-2009          s/p ACD & Fusion (Dr.P.Gurtner)          ?  PR COLONOSCOPY FLX DX W/COLLJ SPEC WHEN PFRMD    02-2007          +polyp(tubular adenoma); Dr Angelique Holm           Allergies:       Allergies        Allergen  Reactions         ?  Aspirin  Other (comments)             GI distress,          ?  Asa-Acetaminophen-Caff-Potass  Other (comments)             Bleeding in stomach         ?  Benadryl [Diphenhydramine Hcl]  Other (comments)             Muscle jerking         ?  Other Plant, Higher education careers adviser, Environmental  Not Reported This Time             Grass, Dust and Mold         ?  Pollen Extracts  Not Reported This Time           Previous reactions to sedation/analgesia?  NO      Review of current medications, supplement, herbals and nutraceuticals complete:  YES             Pertinent labs reviewed?  YES      History of substance abuse?  NO     Family History         Problem  Relation  Age of Onset          ?  Cancer  Mother       ?  Hypertension  Mother       ?  Hypertension  Sister                x3          ?  Heart Surgery  Sister       ?  Heart Disease  Sister       ?  Heart Attack  Sister       ?  Cancer  Maternal Aunt                breast          ?  Breast Cancer  Maternal Aunt       ?  Glaucoma  Son       ?  Heart defect  Son       ?  Diabetes  Paternal Uncle            ?  Hypertension  Paternal Uncle  Social History          Socioeconomic History         ?  Marital status:  DIVORCED              Spouse name:  Not on file         ?  Number of children:  Not on file     ?  Years of education:  Not on file     ?  Highest education level:  Not on file       Occupational History        ?  Not on file       Social Needs         ?  Financial resource strain:  Not on file        ?   Food insecurity              Worry:  Not on file         Inability:  Not on file        ?  Transportation needs              Medical:  Not on file         Non-medical:  Not on file       Tobacco Use         ?  Smoking status:  Former Smoker              Packs/day:  1.00         Years:  28.00         Pack years:  28.00         Last attempt to quit:  04/25/2004         Years since quitting:  15.1         ?  Smokeless tobacco:  Never Used       Substance and Sexual Activity         ?  Alcohol use:  No     ?  Drug use:  No     ?  Sexual activity:  Not on file       Lifestyle        ?  Physical activity              Days per week:  Not on file         Minutes per session:  Not on file         ?  Stress:  Not on file       Relationships        ?  Social Health visitor on phone:  Not on file         Gets together:  Not on file         Attends religious service:  Not on file         Active member of club or organization:  Not on file         Attends meetings of clubs or organizations:  Not on file         Relationship status:  Not on file        ?  Intimate partner violence              Fear of current or ex partner:  Not on file         Emotionally abused:  Not on file         Physically abused:  Not on file         Forced sexual activity:  Not on file        Other Topics  Concern        ?  Not on file       Social History Narrative        ?  Not on file           Review of Systems:       Cardiac Status:  WNL   Mental Status:  WNL    Pulmonary Status:  WNL   NPO:  >4      Physical exam deferred, normal female appearance      Assessment/Impression: Hx of duodenal carcinoid      Plan of treatment: EGD            Sloan Leiter, MD   06/26/2019   8:36 AM

## 2019-06-26 NOTE — Anesthesia Post-Procedure Evaluation (Signed)
Procedure(s):  ESOPHAGOGASTRODUODENOSCOPY.    general, total IV anesthesia    Anesthesia Post Evaluation      Multimodal analgesia: multimodal analgesia not used between 6 hours prior to anesthesia start to PACU discharge  Patient location during evaluation: PACU  Patient participation: complete - patient participated  Level of consciousness: awake  Pain management: satisfactory to patient  Airway patency: patent  Anesthetic complications: no  Cardiovascular status: acceptable, blood pressure returned to baseline and hemodynamically stable  Respiratory status: acceptable  Hydration status: acceptable  Post anesthesia nausea and vomiting:  none  Final Post Anesthesia Temperature Assessment:  Normothermia (36.0-37.5 degrees C)      INITIAL Post-op Vital signs:   Vitals Value Taken Time   BP 148/71 06/26/2019  9:15 AM   Temp     Pulse     Resp     SpO2 96 % 06/26/2019  9:19 AM   Vitals shown include unvalidated device data.

## 2019-06-26 NOTE — Other (Signed)
Discharge instructions reviewed with patient and sister, Tamela Oddi, via phone. Verbalized understanding, denied any questions. Written copy of instructions sent with family.

## 2019-06-26 NOTE — Anesthesia Pre-Procedure Evaluation (Addendum)
Relevant Problems   No relevant active problems       Anesthetic History   No history of anesthetic complications            Review of Systems / Medical History  Patient summary reviewed, nursing notes reviewed and pertinent labs reviewed    Pulmonary        Sleep apnea: CPAP  Shortness of breath  Asthma        Neuro/Psych         Psychiatric history     Cardiovascular    Hypertension          Hyperlipidemia    Exercise tolerance: <4 METS  Comments: Diastolic CHF   GI/Hepatic/Renal     GERD           Endo/Other      Hypothyroidism  Obesity and arthritis     Other Findings   Comments: Chiari I malformation  H/o carcinoid tumor          Physical Exam    Airway  Mallampati: II  TM Distance: 4 - 6 cm  Neck ROM: normal range of motion   Mouth opening: Normal     Cardiovascular  Regular rate and rhythm,  S1 and S2 normal,  no murmur, click, rub, or gallop  Rhythm: regular           Dental    Dentition: Full lower dentures and Full upper dentures     Pulmonary  Breath sounds clear to auscultation               Abdominal  GI exam deferred       Other Findings            Anesthetic Plan    ASA: 3  Anesthesia type: general and total IV anesthesia            Anesthetic plan and risks discussed with: Patient

## 2019-06-26 NOTE — H&P (Signed)
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       Evaluation of past illnesses, surgeries, or injuries:   YES  Past Medical History:   Diagnosis Date   ??? Anemia    ??? Asthma    ??? Colon polyp    ??? DDD (degenerative disc disease) 02/15/2009   ??? Depression 12/18/2011   ??? Essential hypertension, benign    ??? History of echocardiogram 03/20/2004    EF 70%.  Borderline DDfx.  No significant valvular pathology.   ??? Hypercholesterolemia    ??? Hypertension    ??? Iron deficiency anemia 07/24/5630   ??? Lichen planus 4/97/0263   ??? Lower extremity venous duplex 10/23/2009    Left leg:  No DVT.     ??? Meralgia paraesthetica 10/22/2013   ??? OA (osteoarthritis of spine) 02/15/2009   ??? OA (osteoarthritis of spine) 02/15/2009   ??? Obesity, unspecified    ??? Other and unspecified hyperlipidemia    ??? Pre-operative cardiovascular examination     For spine surgery   ??? Right sided sciatica    ??? Shortness of breath     Possible asthma, HCVD; less likely CAD (Noted 03/15/09)   ??? Sleep apnea 02/15/2009    uses cpap machine   ??? Thallium stress test  03/20/2004    Partially transient, mod basal & mid anterior defect most c/w artifact; mild anterior ischemia less likely.  Neg EKG on pharm stress test.   ??? Thyroid disease     hypothyroidism     Past Surgical History:   Procedure Laterality Date   ??? HX COLONOSCOPY  02-24-12    normal (Dr. Angelique Holm)   ??? HX HERNIA REPAIR      3x   ??? HX HYSTERECTOMY     ??? HX TUBAL LIGATION     ??? NEUROLOGICAL PROCEDURE UNLISTED  03-16-2009    s/p ACD & Fusion (Dr.P.Gurtner)   ??? PR COLONOSCOPY FLX DX W/COLLJ SPEC WHEN PFRMD  02-2007    +polyp(tubular adenoma); Dr Angelique Holm       Allergies:    Allergies   Allergen Reactions   ??? Aspirin Other (comments)     GI distress,    ??? Asa-Acetaminophen-Caff-Potass Other (comments)      Bleeding in stomach   ??? Benadryl [Diphenhydramine Hcl] Other (comments)     Muscle jerking   ??? Other Plant, Animal, Environmental Not Reported This Time     Grass, Dust and Mold   ??? Pollen Extracts Not Reported This Time       Previous reactions to sedation/analgesia?  NO    Review of current medications, supplement, herbals and nutraceuticals complete:  YES         Pertinent labs reviewed?  YES    History of substance abuse?  NO  Family History   Problem Relation Age of Onset   ??? Cancer Mother    ??? Hypertension Mother    ??? Hypertension Sister         x3   ??? Heart Surgery Sister    ??? Heart Disease Sister    ??? Heart Attack Sister    ??? Cancer Maternal Aunt         breast   ???  Breast Cancer Maternal Aunt    ??? Glaucoma Son    ??? Heart defect Son    ??? Diabetes Paternal Uncle    ??? Hypertension Paternal Uncle      Social History     Socioeconomic History   ??? Marital status: DIVORCED     Spouse name: Not on file   ??? Number of children: Not on file   ??? Years of education: Not on file   ??? Highest education level: Not on file   Occupational History   ??? Not on file   Social Needs   ??? Financial resource strain: Not on file   ??? Food insecurity     Worry: Not on file     Inability: Not on file   ??? Transportation needs     Medical: Not on file     Non-medical: Not on file   Tobacco Use   ??? Smoking status: Former Smoker     Packs/day: 1.00     Years: 28.00     Pack years: 28.00     Last attempt to quit: 04/25/2004     Years since quitting: 15.1   ??? Smokeless tobacco: Never Used   Substance and Sexual Activity   ??? Alcohol use: No   ??? Drug use: No   ??? Sexual activity: Not on file   Lifestyle   ??? Physical activity     Days per week: Not on file     Minutes per session: Not on file   ??? Stress: Not on file   Relationships   ??? Social Product manager on phone: Not on file     Gets together: Not on file     Attends religious service: Not on file     Active member of club or organization: Not on file      Attends meetings of clubs or organizations: Not on file     Relationship status: Not on file   ??? Intimate partner violence     Fear of current or ex partner: Not on file     Emotionally abused: Not on file     Physically abused: Not on file     Forced sexual activity: Not on file   Other Topics Concern   ??? Not on file   Social History Narrative   ??? Not on file       Review of Systems:     Cardiac Status:  WNL  Mental Status:  WNL   Pulmonary Status:  WNL  NPO:  >4    Physical exam deferred, normal female appearance    Assessment/Impression: Hx of duodenal carcinoid    Plan of treatment: EGD        Sloan Leiter, MD  06/26/2019  8:36 AM

## 2019-06-26 NOTE — Anesthesia Pre-Procedure Evaluation (Signed)
Relevant Problems   No relevant active problems       Anesthetic History   No history of anesthetic complications            Review of Systems / Medical History  Patient summary reviewed, nursing notes reviewed and pertinent labs reviewed    Pulmonary        Sleep apnea: CPAP  Shortness of breath  Asthma        Neuro/Psych         Psychiatric history     Cardiovascular    Hypertension          Hyperlipidemia    Exercise tolerance: <4 METS  Comments: Diastolic CHF   GI/Hepatic/Renal     GERD           Endo/Other      Hypothyroidism  Obesity and arthritis     Other Findings   Comments: Chiari I malformation  H/o carcinoid tumor          Physical Exam    Airway  Mallampati: II  TM Distance: 4 - 6 cm  Neck ROM: normal range of motion   Mouth opening: Normal     Cardiovascular  Regular rate and rhythm,  S1 and S2 normal,  no murmur, click, rub, or gallop  Rhythm: regular           Dental    Dentition: Full lower dentures and Full upper dentures     Pulmonary  Breath sounds clear to auscultation               Abdominal  GI exam deferred       Other Findings            Anesthetic Plan    ASA: 3  Anesthesia type: general and total IV anesthesia            Anesthetic plan and risks discussed with: Patient

## 2019-07-03 DIAGNOSIS — E785 Hyperlipidemia, unspecified: Secondary | ICD-10-CM | POA: Diagnosis not present

## 2019-07-03 DIAGNOSIS — I10 Essential (primary) hypertension: Secondary | ICD-10-CM | POA: Diagnosis not present

## 2019-07-04 NOTE — Telephone Encounter (Signed)
[facility] is contacted to check on the status of the pt's Bipap order.  Danielle at [facility] tells me that the paperwork mailed to the pt. for signature has not been returned.    I contacted the pt..  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  She is asked to sign and return the paperwork this week.  She tells me she will.

## 2019-07-05 ENCOUNTER — Encounter: Attending: Pulmonary Disease | Primary: Internal Medicine

## 2019-07-11 ENCOUNTER — Encounter

## 2019-07-12 MED ORDER — ATORVASTATIN 40 MG TAB
40 mg | ORAL_TABLET | ORAL | 5 refills | Status: DC
Start: 2019-07-12 — End: 2019-07-24

## 2019-07-13 ENCOUNTER — Encounter: Attending: Pulmonary Disease | Primary: Internal Medicine

## 2019-07-17 ENCOUNTER — Ambulatory Visit: Admit: 2019-07-17 | Discharge: 2019-07-17 | Payer: MEDICARE | Attending: Pulmonary Disease | Primary: Internal Medicine

## 2019-07-17 ENCOUNTER — Ambulatory Visit: Attending: Pulmonary Disease | Primary: Internal Medicine

## 2019-07-17 DIAGNOSIS — R062 Wheezing: Secondary | ICD-10-CM

## 2019-07-17 MED ORDER — FLUTICASONE-SALMETEROL 250 MCG-50 MCG/DOSE DISK DEVICE FOR INHALATION
250-50 mcg/dose | Freq: Two times a day (BID) | RESPIRATORY_TRACT | 3 refills | Status: DC
Start: 2019-07-17 — End: 2020-02-20

## 2019-07-17 NOTE — Progress Notes (Signed)
The pt. Is attempting to start "Allergy Injections"  Every time she goes to [facility] the NP Laretta Alstrom finds her to be wheezing via lung auscultation.      Elaine [name] presents today for   Chief Complaint   Patient presents with   . Wheezing     Sick call       Is someone accompanying this pt? No    Is the patient using any DME equipment during OV? Awaiting her CPap at present.  [facility] is due to call with instructions on Thursday.   -[facility]    Depression Screening:  3 most recent PHQ Screens 04/12/2019   Little interest or pleasure in doing things Not at all   Feeling down, depressed, irritable, or hopeless Not at all   Total Score PHQ 2 0       Learning Assessment:  Learning Assessment 03/07/2018   PRIMARY LEARNER Patient   HIGHEST LEVEL OF EDUCATION - PRIMARY LEARNER  -   BARRIERS PRIMARY LEARNER -   CO-LEARNER CAREGIVER -   PRIMARY LANGUAGE ENGLISH   LEARNER PREFERENCE PRIMARY READING   ANSWERED BY patient   RELATIONSHIP SELF       Abuse Screening:  Abuse Screening Questionnaire 12/20/2018   Do you ever feel afraid of your partner? N   Are you in a relationship with someone who physically or mentally threatens you? N   Is it safe for you to go home? Y       Fall Risk  Fall Risk Assessment, last 12 mths 07/17/2019   Able to walk? Yes   Fall in past 12 months? No         Coordination of Care:  1. Have you been to the ER, urgent care clinic since your last visit? Hospitalized since your last visit? NO    2. Have you seen or consulted any other health care providers outside of the [facility] since your last visit? [facility]    Medication variance in dosage/sig per patient as follows: Per Med. REc.

## 2019-07-17 NOTE — Progress Notes (Signed)
Progress Notes by Melissa Montane, DO at 07/17/19 1015                Author: Melissa Montane, DO  Service: --  Author Type: Physician       Filed: 07/18/19 1139  Encounter Date: 07/17/2019  Status: Signed          Editor: Melissa Montane, DO (Physician)                    [facility]   Pulmonary, Critical Care, and Sleep Medicine      Office Progress Note          Primary Care Physician: Thana Farr, MD        Reason for Visit:  Evaluation for Wheezing      Assessment & Plan:            Assessment:   1.  ComplexSleep Apnea (OSA): Severe: (HST: 04/07/19)- Overall AHI: 31.2  and CAI: 12.1- pending starting AutoBIPAP   2.  Excessive Daytime Sleepiness (EDS)/ Hypersomnia- persistent- more than likely related to untreated CSA   3.  H/O OSA- no recent PAP therapy.    4.  Nocturia- gets up to use the bathroom 3-5 times nightly   5.  Asthma: No wheezing today but has not been able to start immunotherapy due to episodic wheezing. Absolute eosinophile count is around 200.  The patient has had difficulty affording long acting agents.  She had been on Arnuity but she could not afford it. She has been taking Albuterol and Singulair. I reviewed inhalers with the patient today and we will see if the patient can afford Advair or its generic form Wixela   6.  Seasonal Allergies   7.  Obesity: Body mass index is 34.46 kg/m??.   8.  Ex-Smoker: Quit in [place]:   ??  Continue Singulair 10mg  daily   ??  Continue Albuterol MDI: PRN- monitor and track use   ??  Start a trial of Advair/Wixela- Patient to call our clinic if she is having difficulty getting inhaler   ??  Trigger avoidance   ??  Order PFTs   ??  Order CXR-PA/LAT   ??  Patient pending starting Auto-BIPAP: IPAP max 25 cwp, EPAP: min: 7 cwp PS:5 Ramp 10 min and monitor for response- especially ongoing central apneas.  If central apneas do not resolve will need to switch to BIPAP- ST   ??  I discussed with patient that she may need to  undergo a future titration study pending Insurance and COVID-19 response   ??  COVID-19: general and PAP precautions discussed- Patient should use PAP device in separate room away from other individuals until we have more information about COVID-19   ??  Proper PAP hygiene and compliance requirements reviewed and discussed   ??  Healthy weight and sleep habits encouraged   ??  Continue with smoking cessation   ??  Driver safety reviewed   ??  Follow up with Primary Care Provider (PCP) as directed and for routine health care maintenance.   ??  Patient is encouraged to contact our clinic if patient is experiencing in difficulties with using PAP device.   ??  Follow up in our clinic in 3 months; sooner if needed.    ??  Further recommendations pending clinical course.              History of Present  Illness: Elaine [name] is a  68 y.o. female patient who presents for report of wheezing by her ENT provider      The patient has been followed in our clinic for newly diagnosed complex sleep apnea (CSA) and is pending Auto-BIPAP setup later this week.      The patient also has a history of asthma that at the time of my evaluation appeared well controlled. The patient was last seen in clinic on 02/20/2019 and she had a virtual follow up on 04/12/2019 to discussed her sleep test results and plan of care.      In regards to her asthma, the following is noted:   ??  ACT today 16 (see CC flow sheet)   ??  The patient reports that she is in the process of starting allergy shots/immunotherapy. She went for evaluation but was noted to have wheezing  in the [facility]. Immunotherapy was placed on hold and a Pulmonary Consult submitted.   ??  The patient reports being diagnosed in her 58's.   ??  She has never been admitted to the hospital for her asthma   ??  No recent ED or Urgent Care visits   ??  The patient denies audible wheezing and reports feeling fine. She reports she was surprised when she went to ENT was told that she was wheezing   ??  She  has GERD that is controlled with Prilosec   ??  Pets: 1 dog   ??  Triggers   ??  Heat   ??  A/C in cars   ??  Fall and Spring are her symptomatic months- especially fall- She was hoping that immunotherapy would make her less symptomatic during these times   ??  Inhalers and other asthma therapy:   ??  Arnuity Ellipta (ICS): Patient ran out and did not refill because it was too expensive   ??  Albuterol MDI PRN   ??  Singulair 10mg  daily   Occupation:    Building control surveyor                      Work Schedule: [phone]   Shift work: No         Family Sleep History:   - Nephew:  Reportedly died from sleep Apnea               Stop [place] Lighter  06/26/2019  02/20/2019         Does the patient snore loudly (louder than talking or loud enough to be heard through closed doors)?  1  1     Does the patient often feel tired, fatigued, or sleepy during the daytime, even after a "good" night's sleep?  1  1     Has anyone ever observed the patient stop breathing during their sleep?   1  0     Does the patient have or are they being treated for high blood pressure?  1  1     Is the patient's BMI greater than 35?  0  0     Is your neck circumference greater than 17 inches (Female) or 16 inches (Female)?  0  0     Is the patient older than 69?  1  1     Is the patient female?  0  0     OSA Score  5  4     Has the patient been referred to Sleep Medicine?  1  -  Has the patient previously been diagnosed with Obstructive Sleep Apnea?  1  -         Treated or Untreated?  Untreated  -              3 most recent PHQ Screens  04/12/2019        Little interest or pleasure in doing things  Not at all     Feeling down, depressed, irritable, or hopeless  Not at all        Total Score PHQ 2  0               Epworth Scale  04/12/2019  02/20/2019         Sitting and Reading  3  3     Watching TV  3  3     Sitting, inactive in a public place (e.g. a movie theater or meeting)  3  3     As a passenger in a car for an hour, without a break  3  3     Lying down to rest in the  afternoon, when circumstances permit  3  3     Sitting and talking to someone  2  1     Sitting quietly after lunch without alcohol  3  2     In a car, while stopped for a few minutes in traffic  2  1         Epworth Sleepiness Score  22  19               Immunization History        Administered  Date(s) Administered         ?  Influenza High Dose Vaccine PF  10/28/2016     ?  Influenza Vaccine (Quad) PF  10/24/2014, 09/17/2015     ?  Influenza Vaccine (Tri) Adjuvanted  10/14/2018     ?  Influenza Vaccine PF  10/02/2013     ?  Influenza Vaccine Split  01/06/2010, 08/21/2010, 10/20/2011     ?  Pneumococcal Conjugate (PCV-13)  02/19/2016     ?  Pneumococcal Polysaccharide (PPSV-23)  10/14/2018     ?  TB Skin Test (PPD) Intradermal  03/03/2016, 03/21/2018     ?  Tdap  10/17/2013         ?  Zoster Vaccine, Live  07/02/2015              Past Medical History:     Past Medical History:        Diagnosis  Date         ?  Anemia       ?  Asthma       ?  Colon polyp       ?  DDD (degenerative disc disease)  02/15/2009     ?  Depression  12/18/2011     ?  Essential hypertension, benign       ?  History of echocardiogram  03/20/2004          EF 70%.  Borderline DDfx.  No significant valvular pathology.         ?  Hypercholesterolemia       ?  Hypertension       ?  Iron deficiency anemia  02/15/2009     ?  Lichen planus  03/15/8118     ?  Lower extremity venous duplex  10/23/2009  Left leg:  No DVT.           ?  Meralgia paraesthetica  10/22/2013     ?  OA (osteoarthritis of spine)  02/15/2009     ?  OA (osteoarthritis of spine)  02/15/2009     ?  Obesity, unspecified       ?  Other and unspecified hyperlipidemia       ?  Pre-operative cardiovascular examination            For spine surgery         ?  Right sided sciatica       ?  Shortness of breath            Possible asthma, HCVD; less likely CAD (Noted 03/15/09)         ?  Sleep apnea  02/15/2009          uses cpap machine         ?  Thallium stress test   03/20/2004           Partially transient, mod basal & mid anterior defect most c/w artifact; mild anterior ischemia less likely.  Neg EKG on pharm stress test.         ?  Thyroid disease            hypothyroidism           Past Surgical History:     Past Surgical History:         Procedure  Laterality  Date          ?  HX COLONOSCOPY    02-24-12          normal (Dr. Angelique Holm)          ?  HX HERNIA REPAIR              3x          ?  HX HYSTERECTOMY         ?  HX TUBAL LIGATION         ?  NEUROLOGICAL PROCEDURE UNLISTED    03-16-2009          s/p ACD & Fusion (Dr.P.Gurtner)          ?  PR COLONOSCOPY FLX DX W/COLLJ SPEC WHEN PFRMD    02-2007          +polyp(tubular adenoma); Dr Angelique Holm           Family History:     Family History         Problem  Relation  Age of Onset          ?  Cancer  Mother       ?  Hypertension  Mother       ?  Hypertension  Sister                x3          ?  Heart Surgery  Sister       ?  Heart Disease  Sister       ?  Heart Attack  Sister       ?  Cancer  Maternal Aunt                breast          ?  Breast Cancer  Maternal Aunt       ?  Glaucoma  Son       ?  Heart defect  Son       ?  Diabetes  Paternal Uncle            ?  Hypertension  Paternal Uncle             Social History:     Social History          Tobacco Use         ?  Smoking status:  Former Smoker              Packs/day:  1.00         Years:  28.00         Pack years:  28.00         Start date:  10/16/1977         Last attempt to quit:  04/25/2004         Years since quitting:  15.2         ?  Smokeless tobacco:  Never Used       Substance Use Topics         ?  Alcohol use:  No         ?  Drug use:  No                 Caffeine  Amount  Time of last Intake  Comments          Coffee  Several/day  evening       Soda  2/day    Diet Pepsi     Tea  Rare              Energy Drinks  None              Over- the - counter stimulant pills  None              Other Substances                Alcohol  None         Tobacco  None         Drugs  None              Other:   None               Medications:     Current Outpatient Medications on File Prior to Visit          Medication  Sig  Dispense  Refill           ?  OTHER  Latanoprost .005% one drop each eye daily         ?  fluticasone furoate (Arnuity Ellipta) 200 mcg/actuation dsdv inhaler  Take 1 Puff by inhalation daily.         ?  OTHER  1 Tab daily. Align Probiotic         ?  atorvastatin (LIPITOR) 40 mg tablet  take 1 tablet by mouth once daily  30 Tab  5     ?  albuterol (PROVENTIL HFA, VENTOLIN HFA, PROAIR HFA) 90 mcg/actuation inhaler  Take 2 Puffs by inhalation every four (4) hours as needed for Wheezing.         ?  omeprazole (PRILOSEC) 20 mg capsule  Take 20 mg by mouth two (2) times a day.         ?  potassium chloride (KLOR-CON) 10 mEq tablet  take 1 tablet by mouth once daily  90 Tab  3     ?  valsartan-hydroCHLOROthiazide (DIOVAN-HCT) 160-25 mg per tablet  Take 1 Tab by mouth daily.  90 Tab  3     ?  terbinafine HCL (LAMISIL) 250 mg tablet  take 1 tablet by mouth once daily  90 Tab  0     ?  levothyroxine (SYNTHROID) 150 mcg tablet  take 1 tablet by mouth once daily before breakfast  90 Tab  1     ?  montelukast (SINGULAIR) 10 mg tablet  take 1 tablet by mouth at bedtime if needed (Patient taking differently: Take 10 mg by mouth nightly. TAKE 1 TABLET BY MOUTH AT BEDTIME IF NEEDED)  90 Tab  1     ?  gabapentin (NEURONTIN) 300 mg capsule  take 1 capsule by mouth every morning and 2 at bedtime  180 Cap  1     ?  carvediloL (COREG) 3.125 mg tablet  Take 1 Tab by mouth two (2) times daily (with meals). (Patient taking differently: Take 6.25 mg by mouth two (2) times daily (with meals).)  60 Tab  2     ?  latanoprost (XALATAN) 0.005 % ophthalmic solution  Administer 1 Drop to both eyes nightly.         ?  multivitamin (ONE A DAY) tablet  Take 1 Tab by mouth daily.               ?  Cholecalciferol, Vitamin D3, (VITAMIN D-3) 5,000 unit Tab  Take  by mouth daily.              No current facility-administered medications on  file prior to visit.             Allergy:     Allergies        Allergen  Reactions         ?  Aspirin  Other (comments)             GI distress,          ?  Asa-Acetaminophen-Caff-Potass  Other (comments)             Bleeding in stomach         ?  Benadryl [Diphenhydramine Hcl]  Other (comments)             Muscle jerking         ?  Other Plant, Higher education careers adviser, Environmental  Not Reported This Time             Grass, Dust and Mold         ?  Pollen Extracts  Not Reported This Time           Review of Systems   General ROS: positive for  - fatigue and sleep disturbance   negative for - chills, fever, hot flashes, malaise or night sweats   ENT ROS: negative for - epistaxis, headaches, hearing change, nasal discharge, nasal polyps, oral lesions, sinus pain, sneezing, sore throat, tinnitus, vertigo or visual changes   Hematological and Lymphatic ROS: negative for - bleeding problems, blood clots, bruising, jaundice, pallor or swollen lymph nodes   Endocrine ROS: negative for - polydipsia/polyuria, skin changes, temperature intolerance or unexpected weight changes   Respiratory ROS: no cough, shortness of breath, + wheezing   Cardiovascular ROS: no chest pain or dyspnea on exertion   Gastrointestinal ROS: no abdominal pain, change in bowel habits, or black or bloody stools   Genito-Urinary ROS: no dysuria, trouble voiding, or hematuria   Musculoskeletal ROS: negative  Neurological ROS: no TIA or stroke symptoms   Dermatological ROS: negative for - pruritus, rash or skin lesion changes    Psychological ROS: asa bove    Otherwise negative.         Physical Exam:   Blood pressure 136/60, pulse 84, temperature 98.7 ??F (37.1 ??C), resp. rate 22, height 5\' 2"  (1.575 m), weight 86.8 kg (191 lb 6.4 oz), SpO2 95 %. on RA, Body mass index is 35.01 kg/m??.       General: No distress, acyanotic, appears stated age, cooperative, pleasant   HEENT: PERRL, EOMI, throat without erythema or exudate, Tongue- large - without dental  indention on  tongue, Mallampati's score 3+, Uvula- midline, Tonsils- not seen,    Neck: Supple,  no abnormally enlarged lymph nodes, thyroid is not enlarged, non-tender, No JVD No Carotid bruit   Chest: Normal.   Lungs: Moderate air entry, clear to auscultation bilaterally- Good effort- no wheezing  or upper airway sounds   Heart: Regular rate and rhythm, S1S2 present, without murmur.   Abdomen: Protuberant, bowel sounds normoactive, abdomen is soft without significant tenderness, or guarding.   Extremity: Negative for cyanosis, edema, or clubbing.   Skin: Skin color, texture, turgor normal. No rashes or lesions.   Neurological: CN 2-12 grossly intact, normal muscle tone.      Data Reviewed:   CBC:      Lab Results         Component  Value  Date/Time            WBC  6.2  01/10/2018 04:34 PM       HGB  10.3 (L)  01/10/2018 04:34 PM       HCT  32.5 (L)  01/10/2018 04:34 PM       PLATELET  318  01/10/2018 04:34 PM            MCV  79.9  01/10/2018 04:34 PM        Results for OLINE, BELK (MRN [id number]) as of 07/17/2019 11:33             Ref. Range  04/27/2015 00:00  02/17/2016 00:00  07/03/2016 01:20  10/24/2016 07:00  01/10/2018 16:34     ABS. EOSINOPHILS  Latest Ref Range: 0.0 - 0.4 K/UL  0.2  0.2    0.1  0.2           BMP:      Lab Results         Component  Value  Date/Time            Sodium  141  06/06/2019 09:38 AM       Potassium  4.1  06/06/2019 09:38 AM       Chloride  102  06/06/2019 09:38 AM       CO2  25  06/06/2019 09:38 AM       Anion gap  3  10/10/2018 03:55 PM       Glucose  104 (H)  06/06/2019 09:38 AM       BUN  9  06/06/2019 09:38 AM       Creatinine  0.54 (L)  06/06/2019 09:38 AM       BUN/Creatinine ratio  17  06/06/2019 09:38 AM       GFR est AA  111  06/06/2019 09:38 AM       GFR est non-AA  97  06/06/2019 09:38 AM  Calcium  9.5  06/06/2019 09:38 AM              Lab Results         Component  Value  Date/Time            TSH  0.140 (L)  06/06/2019 09:38 AM            T4, Free  1.3  03/29/2017 05:30 PM           Lab Results         Component  Value  Date/Time            Hemoglobin A1c  5.7 (H)  06/06/2019 09:38 AM           Imaging:   [x] I have  personally reviewed the patients radiographs section      Results from Hospital Encounter encounter on 02/13/17     XR CHEST PA LAT           Narrative  Clinical history: Cough, chest pain      EXAMINATION: PA and lateral views of the chest 02/13/2017      Correlation: 07/02/2016      FINDINGS:   Anterior cervical. There is cardiomegaly. Trachea is midline. Lungs are clear.   Degenerative changes of the spine.              Impression  IMPRESSION:   Cardiomegaly.                Results from [facility] encounter on 01/10/18     CT HEAD WO CONT           Narrative  EXAMINATION: CT head without contrast      INDICATION: Dizziness, fatigue      COMPARISON: None      TECHNIQUE: CT of the head performed without contrast, with multiplanar   reformations. All CT scans at this facility are performed using dose   optimization technique as appropriate to a performed exam, to include automated   exposure control, adjustment of the mA and/or kV according to patient size   (including appropriate matching first site specific examinations), or use of   iterative reconstruction technique.      FINDINGS: No focal mass effect or shift of midline structures. No abnormal   extra-axial collection identified. Ventricles are normal in size and   configuration. No evidence of acute intracranial hemorrhage. No suspicious   parenchymal lesions. Calvarium intact. Imaged paranasal sinuses and mastoids are   well-aerated. Superficial soft tissues unremarkable.              Impression  IMPRESSION:      No acute intracranial findings. Of note, MRI is more sensitive in the detection   of acute infarct.            Historical Sleep Testing Data:   ??  HST: 04/07/19: AHI: 25.7, CAI: 2.1, ODI: 18.7, SpO2 nadir: 58%, SpO2 Avg: 90%- Moderate  Complex Sleep Apnea             Cecil Cranker, DO, FCCP    Pulmonary, Sleep and Critical Care Medicine

## 2019-07-17 NOTE — Progress Notes (Signed)
The pt. Is attempting to start "Allergy Injections"  Every time she goes to [facility] the NP Milus Mallick finds her to be wheezing via lung auscultation.      Elaine [name] presents today for   Chief Complaint   Patient presents with   ??? Wheezing     Sick call       Is someone accompanying this pt? No    Is the patient using any DME equipment during OV? Awaiting her CPap at present.  [facility] is due to call with instructions on Thursday.   -[facility]    Depression Screening:  3 most recent PHQ Screens 04/12/2019   Little interest or pleasure in doing things Not at all   Feeling down, depressed, irritable, or hopeless Not at all   Total Score PHQ 2 0       Learning Assessment:  Learning Assessment 03/07/2018   PRIMARY LEARNER Patient   HIGHEST LEVEL OF EDUCATION - PRIMARY LEARNER  -   BARRIERS PRIMARY LEARNER -   CO-LEARNER CAREGIVER -   PRIMARY LANGUAGE ENGLISH   LEARNER PREFERENCE PRIMARY READING   ANSWERED BY patient   RELATIONSHIP SELF       Abuse Screening:  Abuse Screening Questionnaire 12/20/2018   Do you ever feel afraid of your partner? N   Are you in a relationship with someone who physically or mentally threatens you? N   Is it safe for you to go home? Y       Fall Risk  Fall Risk Assessment, last 12 mths 07/17/2019   Able to walk? Yes   Fall in past 12 months? No         Coordination of Care:  1. Have you been to the ER, urgent care clinic since your last visit? Hospitalized since your last visit? NO    2. Have you seen or consulted any other health care providers outside of the [place] since your last visit? [facility]    Medication variance in dosage/sig per patient as follows: Per Med. REc.

## 2019-07-17 NOTE — Progress Notes (Signed)
[facility]  Pulmonary, Critical Care, and Sleep Medicine    Office Progress Note       Primary Care Physician: Thana Farr, MD     Reason for Visit:  Evaluation for Wheezing    Assessment & Plan:      Assessment:  1. ComplexSleep Apnea (OSA): Severe: (HST: 04/07/19)- Overall AHI: 31.2 and CAI: 12.1- pending starting AutoBIPAP  2. Excessive Daytime Sleepiness (EDS)/ Hypersomnia- persistent- more than likely related to untreated CSA  3. H/O OSA- no recent PAP therapy.   4. Nocturia- gets up to use the bathroom 3-5 times nightly  5. Asthma: No wheezing today but has not been able to start immunotherapy due to episodic wheezing. Absolute eosinophile count is around 200.  The patient has had difficulty affording long acting agents. She had been on Arnuity but she could not afford it. She has been taking Albuterol and Singulair. I reviewed inhalers with the patient today and we will see if the patient can afford Advair or its generic form Wixela  6. Seasonal Allergies  7. Obesity: Body mass index is 34.46 kg/m??.  8. Ex-Smoker: Quit in [place]:  ??? Continue Singulair 10mg  daily  ??? Continue Albuterol MDI: PRN- monitor and track use  ??? Start a trial of Advair/Wixela- Patient to call our clinic if she is having difficulty getting inhaler  ??? Trigger avoidance  ??? Order PFTs  ??? Order CXR-PA/LAT  ??? Patient pending starting Auto-BIPAP: IPAP max 25 cwp, EPAP: min: 7 cwp PS:5 Ramp 10 min and monitor for response- especially ongoing central apneas.  If central apneas do not resolve will need to switch to BIPAP- ST  ??? I discussed with patient that she may need to undergo a future titration study pending Insurance and COVID-19 response  ??? COVID-19: general and PAP precautions discussed- Patient should use PAP device in separate room away from other individuals until we have more information about COVID-19  ??? Proper PAP hygiene and compliance requirements reviewed and discussed  ??? Healthy weight and sleep  habits encouraged  ??? Continue with smoking cessation  ??? Driver safety reviewed  ??? Follow up with Primary Care Provider (PCP) as directed and for routine health care maintenance.  ??? Patient is encouraged to contact our clinic if patient is experiencing in difficulties with using PAP device.  ??? Follow up in our clinic in 3 months; sooner if needed.   ??? Further recommendations pending clinical course.         History of Present Illness: Elaine [name] is a 68 y.o. female patient who presents for report of wheezing by her ENT provider    The patient has been followed in our clinic for newly diagnosed complex sleep apnea (CSA) and is pending Auto-BIPAP setup later this week.    The patient also has a history of asthma that at the time of my evaluation appeared well controlled. The patient was last seen in clinic on 02/20/2019 and she had a virtual follow up on 04/12/2019 to discussed her sleep test results and plan of care.    In regards to her asthma, the following is noted:  ?? ACT today 16 (see CC flow sheet)  ?? The patient reports that she is in the process of starting allergy shots/immunotherapy. She went for evaluation but was noted to have wheezing in the LLL. Immunotherapy was placed on hold and a Pulmonary Consult submitted.  ?? The patient reports being diagnosed in  her 42's.  ?? She has never been admitted to the hospital for her asthma  ?? No recent ED or Urgent Care visits  ?? The patient denies audible wheezing and reports feeling fine. She reports she was surprised when she went to ENT was told that she was wheezing  ?? She has GERD that is controlled with Prilosec  ?? Pets: 1 dog  ?? Triggers  ?? Heat  ?? A/C in cars  ?? Fall and Spring are her symptomatic months- especially fall- She was hoping that immunotherapy would make her less symptomatic during these times  ?? Inhalers and other asthma therapy:  ?? Arnuity Ellipta (ICS): Patient ran out and did not refill because it was too expensive  ?? Albuterol MDI PRN  ??  Singulair 10mg  daily  Occupation:    Building control surveyor                     Work Schedule: [phone]  Shift work: No      Family Sleep History:  - Nephew:  Reportedly died from sleep Apnea        Stop [place] Lighter 06/26/2019 02/20/2019   Does the patient snore loudly (louder than talking or loud enough to be heard through closed doors)? 1 1   Does the patient often feel tired, fatigued, or sleepy during the daytime, even after a "good" night's sleep? 1 1   Has anyone ever observed the patient stop breathing during their sleep?  1 0   Does the patient have or are they being treated for high blood pressure? 1 1   Is the patient's BMI greater than 35? 0 0   Is your neck circumference greater than 17 inches (Female) or 16 inches (Female)? 0 0   Is the patient older than 30? 1 1   Is the patient female? 0 0   OSA Score 5 4   Has the patient been referred to Sleep Medicine? 1 -   Has the patient previously been diagnosed with Obstructive Sleep Apnea? 1 -   Treated or Untreated? Untreated -       3 most recent PHQ Screens 04/12/2019   Little interest or pleasure in doing things Not at all   Feeling down, depressed, irritable, or hopeless Not at all   Total Score PHQ 2 0       Epworth Scale 04/12/2019 02/20/2019   Sitting and Reading 3 3   Watching TV 3 3   Sitting, inactive in a public place (e.g. a movie theater or meeting) 3 3   As a passenger in a car for an hour, without a break 3 3   Lying down to rest in the afternoon, when circumstances permit 3 3   Sitting and talking to someone 2 1   Sitting quietly after lunch without alcohol 3 2   In a car, while stopped for a few minutes in traffic 2 1   Epworth Sleepiness Score 22 19         Immunization History   Administered Date(s) Administered   ??? Influenza High Dose Vaccine PF 10/28/2016   ??? Influenza Vaccine (Quad) PF 10/24/2014, 09/17/2015   ??? Influenza Vaccine (Tri) Adjuvanted 10/14/2018   ??? Influenza Vaccine PF 10/02/2013   ??? Influenza Vaccine Split 01/06/2010, 08/21/2010, 10/20/2011   ???  Pneumococcal Conjugate (PCV-13) 02/19/2016   ??? Pneumococcal Polysaccharide (PPSV-23) 10/14/2018   ??? TB Skin Test (PPD) Intradermal 03/03/2016, 03/21/2018   ??? Tdap 10/17/2013   ???  Zoster Vaccine, Live 07/02/2015         Past Medical History:  Past Medical History:   Diagnosis Date   ??? Anemia    ??? Asthma    ??? Colon polyp    ??? DDD (degenerative disc disease) 02/15/2009   ??? Depression 12/18/2011   ??? Essential hypertension, benign    ??? History of echocardiogram 03/20/2004    EF 70%.  Borderline DDfx.  No significant valvular pathology.   ??? Hypercholesterolemia    ??? Hypertension    ??? Iron deficiency anemia 1/61/0960   ??? 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 Lower extremity venous duplex 10/23/2009    Left leg:  No DVT.     ??? Meralgia paraesthetica 10/22/2013   ??? OA (osteoarthritis of spine) 02/15/2009   ??? OA (osteoarthritis of spine) 02/15/2009   ??? Obesity, unspecified    ??? Other and unspecified hyperlipidemia    ??? Pre-operative cardiovascular examination     For spine surgery   ??? Right sided sciatica    ??? Shortness of breath     Possible asthma, HCVD; less likely CAD (Noted 03/15/09)   ??? Sleep apnea 02/15/2009    uses cpap machine   ??? Thallium stress test  03/20/2004    Partially transient, mod basal & mid anterior defect most c/w artifact; mild anterior ischemia less likely.  Neg EKG on pharm stress test.   ??? Thyroid disease     hypothyroidism       Past Surgical History:  Past Surgical History:   Procedure Laterality Date   ??? HX COLONOSCOPY  02-24-12    normal (Dr. Angelique Holm)   ??? HX HERNIA REPAIR      3x   ??? HX HYSTERECTOMY     ??? HX TUBAL LIGATION     ??? NEUROLOGICAL PROCEDURE UNLISTED  03-16-2009    s/p ACD & Fusion (Dr.P.Gurtner)   ??? PR COLONOSCOPY FLX DX W/COLLJ SPEC WHEN PFRMD  02-2007    +polyp(tubular adenoma); Dr Angelique Holm       Family History:  Family History   Problem Relation Age of Onset   ??? Cancer Mother    ??? Hypertension Mother    ??? Hypertension Sister         x3   ??? Heart Surgery Sister    ??? Heart Disease Sister    ??? Heart Attack  Sister    ??? Cancer Maternal Aunt         breast   ??? Breast Cancer Maternal Aunt    ??? Glaucoma Son    ??? Heart defect Son    ??? Diabetes Paternal Uncle    ??? Hypertension Paternal Uncle        Social History:  Social History     Tobacco Use   ??? Smoking status: Former Smoker     Packs/day: 1.00     Years: 28.00     Pack years: 28.00     Start date: 10/16/1977     Last attempt to quit: 04/25/2004     Years since quitting: 15.2   ??? Smokeless tobacco: Never Used   Substance Use Topics   ??? Alcohol use: No   ??? Drug use: No        Caffeine Amount Time of last Intake Comments   Coffee Several/day evening    Soda 2/day  Diet Pepsi   Tea Rare     Energy Drinks None     Over- the - counter stimulant pills None  Other Substances      Alcohol None     Tobacco None     Drugs None     Other: None         Medications:  Current Outpatient Medications on File Prior to Visit   Medication Sig Dispense Refill   ??? OTHER Latanoprost .005% one drop each eye daily     ??? fluticasone furoate (Arnuity Ellipta) 200 mcg/actuation dsdv inhaler Take 1 Puff by inhalation daily.     ??? OTHER 1 Tab daily. Align Probiotic     ??? atorvastatin (LIPITOR) 40 mg tablet take 1 tablet by mouth once daily 30 Tab 5   ??? albuterol (PROVENTIL HFA, VENTOLIN HFA, PROAIR HFA) 90 mcg/actuation inhaler Take 2 Puffs by inhalation every four (4) hours as needed for Wheezing.     ??? omeprazole (PRILOSEC) 20 mg capsule Take 20 mg by mouth two (2) times a day.     ??? potassium chloride (KLOR-CON) 10 mEq tablet take 1 tablet by mouth once daily 90 Tab 3   ??? valsartan-hydroCHLOROthiazide (DIOVAN-HCT) 160-25 mg per tablet Take 1 Tab by mouth daily. 90 Tab 3   ??? terbinafine HCL (LAMISIL) 250 mg tablet take 1 tablet by mouth once daily 90 Tab 0   ??? levothyroxine (SYNTHROID) 150 mcg tablet take 1 tablet by mouth once daily before breakfast 90 Tab 1   ??? montelukast (SINGULAIR) 10 mg tablet take 1 tablet by mouth at bedtime if needed (Patient taking differently: Take 10 mg by mouth  nightly. TAKE 1 TABLET BY MOUTH AT BEDTIME IF NEEDED) 90 Tab 1   ??? gabapentin (NEURONTIN) 300 mg capsule take 1 capsule by mouth every morning and 2 at bedtime 180 Cap 1   ??? carvediloL (COREG) 3.125 mg tablet Take 1 Tab by mouth two (2) times daily (with meals). (Patient taking differently: Take 6.25 mg by mouth two (2) times daily (with meals).) 60 Tab 2   ??? latanoprost (XALATAN) 0.005 % ophthalmic solution Administer 1 Drop to both eyes nightly.     ??? multivitamin (ONE A DAY) tablet Take 1 Tab by mouth daily.     ??? Cholecalciferol, Vitamin D3, (VITAMIN D-3) 5,000 unit Tab Take  by mouth daily.       No current facility-administered medications on file prior to visit.         Allergy:  Allergies   Allergen Reactions   ??? Aspirin Other (comments)     GI distress,    ??? Asa-Acetaminophen-Caff-Potass Other (comments)     Bleeding in stomach   ??? Benadryl [Diphenhydramine Hcl] Other (comments)     Muscle jerking   ??? Other Plant, Animal, Environmental Not Reported This Time     Grass, Dust and Mold   ??? Pollen Extracts Not Reported This Time       Review of Systems  General ROS: positive for  - fatigue and sleep disturbance  negative for - chills, fever, hot flashes, malaise or night sweats  ENT ROS: negative for - epistaxis, headaches, hearing change, nasal discharge, nasal polyps, oral lesions, sinus pain, sneezing, sore throat, tinnitus, vertigo or visual changes  Hematological and Lymphatic ROS: negative for - bleeding problems, blood clots, bruising, jaundice, pallor or swollen lymph nodes  Endocrine ROS: negative for - polydipsia/polyuria, skin changes, temperature intolerance or unexpected weight changes  Respiratory ROS: no cough, shortness of breath, + wheezing  Cardiovascular ROS: no chest pain or dyspnea on exertion  Gastrointestinal ROS: no abdominal pain, change  in bowel habits, or black or bloody stools  Genito-Urinary ROS: no dysuria, trouble voiding, or hematuria  Musculoskeletal ROS: negative  Neurological  ROS: no TIA or stroke symptoms  Dermatological ROS: negative for - pruritus, rash or skin lesion changes   Psychological ROS: asa bove   Otherwise negative.      Physical Exam:  Blood pressure 136/60, pulse 84, temperature 98.7 ??F (37.1 ??C), resp. rate 22, height 5\' 2"  (1.575 m), weight 86.8 kg (191 lb 6.4 oz), SpO2 95 %.on RA, Body mass index is 35.01 kg/m??.     General: No distress, acyanotic, appears stated age, cooperative, pleasant  HEENT: PERRL, EOMI, throat without erythema or exudate, Tongue- large - without dental indention on tongue, Mallampati's score 3+, Uvula- midline, Tonsils- not seen,   Neck: Supple,  no abnormally enlarged lymph nodes, thyroid is not enlarged, non-tender, No JVD No Carotid bruit  Chest: Normal.  Lungs: Moderate air entry, clear to auscultation bilaterally- Good effort- no wheezing or upper airway sounds  Heart: Regular rate and rhythm, S1S2 present, without murmur.  Abdomen: Protuberant, bowel sounds normoactive, abdomen is soft without significant tenderness, or guarding.  Extremity: Negative for cyanosis, edema, or clubbing.  Skin: Skin color, texture, turgor normal. No rashes or lesions.  Neurological: CN 2-12 grossly intact, normal muscle tone.    Data Reviewed:  CBC:   Lab Results   Component Value Date/Time    WBC 6.2 01/10/2018 04:34 PM    HGB 10.3 (L) 01/10/2018 04:34 PM    HCT 32.5 (L) 01/10/2018 04:34 PM    PLATELET 318 01/10/2018 04:34 PM    MCV 79.9 01/10/2018 04:34 PM     Results for Elaine, [name] (MRN [id number]) as of 07/17/2019 11:33   Ref. Range 04/27/2015 00:00 02/17/2016 00:00 07/03/2016 01:20 10/24/2016 07:00 01/10/2018 16:34   ABS. EOSINOPHILS Latest Ref Range: 0.0 - 0.4 K/UL 0.2 0.2  0.1 0.2       BMP:   Lab Results   Component Value Date/Time    Sodium 141 06/06/2019 09:38 AM    Potassium 4.1 06/06/2019 09:38 AM    Chloride 102 06/06/2019 09:38 AM    CO2 25 06/06/2019 09:38 AM    Anion gap 3 10/10/2018 03:55 PM    Glucose 104 (H) 06/06/2019 09:38 AM    BUN 9  06/06/2019 09:38 AM    Creatinine 0.54 (L) 06/06/2019 09:38 AM    BUN/Creatinine ratio 17 06/06/2019 09:38 AM    GFR est AA 111 06/06/2019 09:38 AM    GFR est non-AA 97 06/06/2019 09:38 AM    Calcium 9.5 06/06/2019 09:38 AM        Lab Results   Component Value Date/Time    TSH 0.140 (L) 06/06/2019 09:38 AM    T4, Free 1.3 03/29/2017 05:30 PM     Lab Results   Component Value Date/Time    Hemoglobin A1c 5.7 (H) 06/06/2019 09:38 AM       Imaging:  [x] I have personally reviewed the patient???s radiographs section   Results from Hospital Encounter encounter on 02/13/17   XR CHEST PA LAT    Narrative Clinical history: Cough, chest pain    EXAMINATION: PA and lateral views of the chest 02/13/2017    Correlation: 07/02/2016    FINDINGS:  Anterior cervical. There is cardiomegaly. Trachea is midline. Lungs are clear.  Degenerative changes of the spine.      Impression IMPRESSION:  Cardiomegaly.         Results from  Hospital Encounter encounter on 01/10/18   CT HEAD WO CONT    Narrative EXAMINATION: CT head without contrast    INDICATION: Dizziness, fatigue    COMPARISON: None    TECHNIQUE: CT of the head performed without contrast, with multiplanar  reformations. All CT scans at this facility are performed using dose  optimization technique as appropriate to a performed exam, to include automated  exposure control, adjustment of the mA and/or kV according to patient size  (including appropriate matching first site specific examinations), or use of  iterative reconstruction technique.    FINDINGS: No focal mass effect or shift of midline structures. No abnormal  extra-axial collection identified. Ventricles are normal in size and  configuration. No evidence of acute intracranial hemorrhage. No suspicious  parenchymal lesions. Calvarium intact. Imaged paranasal sinuses and mastoids are  well-aerated. Superficial soft tissues unremarkable.      Impression IMPRESSION:    No acute intracranial findings. Of note, MRI is more sensitive  in the detection  of acute infarct.        Historical Sleep Testing Data:  ?? HST: 04/07/19: AHI: 25.7, CAI: 2.1, ODI: 18.7, SpO2 nadir: 58%, SpO2 Avg: 90%- Moderate Complex Sleep Apnea         Cecil Cranker, DO, FCCP  Pulmonary, Sleep and Critical Care Medicine

## 2019-07-21 ENCOUNTER — Encounter

## 2019-07-21 MED ORDER — OMEPRAZOLE 20 MG CAP, DELAYED RELEASE
20 mg | ORAL_CAPSULE | Freq: Two times a day (BID) | ORAL | 0 refills | Status: DC
Start: 2019-07-21 — End: 2020-02-23

## 2019-07-21 MED ORDER — ALBUTEROL SULFATE HFA 90 MCG/ACTUATION AEROSOL INHALER
90 mcg/actuation | RESPIRATORY_TRACT | 0 refills | Status: DC | PRN
Start: 2019-07-21 — End: 2020-06-11

## 2019-07-21 NOTE — Telephone Encounter (Signed)
[facility] is requesting a 90 day supply    This med is listed as historical. Please sign if appropriate.    Last Visit: 06/06/19 with MD Hunte  Next Appointment: 09/06/19 with MD Hunte    Requested Prescriptions     Pending Prescriptions Disp Refills   ??? omeprazole (PRILOSEC) 20 mg capsule 180 Cap 0     Sig: Take 1 Cap by mouth two (2) times a day.   ??? albuterol (PROVENTIL HFA, VENTOLIN HFA, PROAIR HFA) 90 mcg/actuation inhaler 1 Inhaler 0     Sig: Take 2 Puffs by inhalation every four (4) hours as needed for Wheezing.

## 2019-07-24 ENCOUNTER — Encounter

## 2019-07-24 MED ORDER — TERBINAFINE 250 MG TAB
250 mg | ORAL_TABLET | ORAL | 0 refills | Status: DC
Start: 2019-07-24 — End: 2019-10-31

## 2019-07-24 MED ORDER — MONTELUKAST 10 MG TAB
10 mg | ORAL_TABLET | Freq: Every evening | ORAL | 1 refills | Status: DC
Start: 2019-07-24 — End: 2019-10-10

## 2019-07-24 MED ORDER — ATORVASTATIN 40 MG TAB
40 mg | ORAL_TABLET | Freq: Every day | ORAL | 1 refills | Status: DC
Start: 2019-07-24 — End: 2020-06-11

## 2019-07-24 MED ORDER — GABAPENTIN 300 MG CAP
300 mg | ORAL_CAPSULE | ORAL | 1 refills | Status: DC
Start: 2019-07-24 — End: 2019-07-30

## 2019-07-24 MED ORDER — LEVOTHYROXINE 150 MCG TAB
150 mcg | ORAL_TABLET | Freq: Every day | ORAL | 1 refills | Status: DC
Start: 2019-07-24 — End: 2020-05-11

## 2019-07-24 MED ORDER — VALSARTAN-HYDROCHLOROTHIAZIDE 160 MG-25 MG TAB
160-25 mg | ORAL_TABLET | Freq: Every day | ORAL | 3 refills | Status: DC
Start: 2019-07-24 — End: 2020-08-13

## 2019-07-24 NOTE — Telephone Encounter (Signed)
She needs to get eye drops from her eye doctor

## 2019-07-24 NOTE — Telephone Encounter (Addendum)
Xalatan is listed as historical. Please sign if appropriate.    [facility] is requesting a 90 day supply  Previous Rx's were sent to [place] [facility] reports the last fill date for Neurontin as 05/20/19 for a 60 d/s.     Last Visit: 06/06/19 with MD Hunte  Next Appointment: 09/06/19 with MD Hunte    Requested Prescriptions     Pending Prescriptions Disp Refills   ??? atorvastatin (LIPITOR) 40 mg tablet 90 Tab 1     Sig: Take 1 Tab by mouth daily.   ??? gabapentin (NEURONTIN) 300 mg capsule 270 Cap 1     Sig: Take 1 Cap by mouth every morning AND 2 Caps nightly. Max Daily Amount: 900 mg.   ??? latanoprost (XALATAN) 0.005 % ophthalmic solution 3 Bottle 1     Sig: Administer 1 Drop to both eyes nightly.   ??? levothyroxine (SYNTHROID) 150 mcg tablet 90 Tab 1     Sig: Take 1 Tab by mouth Daily (before breakfast).   ??? montelukast (SINGULAIR) 10 mg tablet 90 Tab 1     Sig: Take 1 Tab by mouth nightly. TAKE 1 TABLET BY MOUTH AT BEDTIME IF NEEDED   ??? terbinafine HCL (LAMISIL) 250 mg tablet 90 Tab 0     Sig: take 1 tablet by mouth once daily   ??? valsartan-hydroCHLOROthiazide (DIOVAN-HCT) 160-25 mg per tablet 90 Tab 3     Sig: Take 1 Tab by mouth daily.

## 2019-07-25 DIAGNOSIS — R944 Abnormal results of kidney function studies: Secondary | ICD-10-CM | POA: Diagnosis not present

## 2019-07-27 NOTE — Telephone Encounter (Signed)
[employer] calling patient is saying she should have gotten rx for Carvedilol 3.125 and Pantorprazol 40 mg    If pt is supposed to be taking, please call into [facility] mail order

## 2019-07-27 NOTE — Telephone Encounter (Signed)
Spoke with pharmacy tech Wendie Chess she was advised she would need to call Cardiology Dr. Lyndel Safe to verify dose of Coreg.  Also Omeprazole was sent to the pharmacy on 07/21/2019. Elaine [name] verbalizes understanding.

## 2019-07-28 ENCOUNTER — Encounter

## 2019-07-28 MED ORDER — CARVEDILOL 6.25 MG TAB
6.25 mg | ORAL_TABLET | Freq: Two times a day (BID) | ORAL | 3 refills | Status: DC
Start: 2019-07-28 — End: 2019-08-01

## 2019-07-28 NOTE — Telephone Encounter (Signed)
Follow up is scheduled for June 07, 2020            Requested Prescriptions     Pending Prescriptions Disp Refills   ??? carvediloL (COREG) 6.25 mg tablet 180 Tab 3     Sig: Take 1 Tab by mouth two (2) times daily (with meals).

## 2019-07-29 ENCOUNTER — Encounter

## 2019-07-30 MED ORDER — GABAPENTIN 300 MG CAP
300 mg | ORAL_CAPSULE | ORAL | 1 refills | Status: DC
Start: 2019-07-30 — End: 2020-02-26

## 2019-08-01 ENCOUNTER — Encounter

## 2019-08-01 MED ORDER — CARVEDILOL 6.25 MG TAB
6.25 mg | ORAL_TABLET | Freq: Two times a day (BID) | ORAL | 3 refills | Status: DC
Start: 2019-08-01 — End: 2020-09-18

## 2019-08-01 NOTE — Telephone Encounter (Signed)
Follow up is scheduled for June 07, 2020            Requested Prescriptions     Pending Prescriptions Disp Refills   ??? carvediloL (COREG) 6.25 mg tablet 180 Tab 3     Sig: Take 1 Tab by mouth two (2) times daily (with meals).

## 2019-08-08 DIAGNOSIS — E785 Hyperlipidemia, unspecified: Secondary | ICD-10-CM | POA: Diagnosis not present

## 2019-09-01 ENCOUNTER — Ambulatory Visit: Admit: 2019-09-01 | Discharge: 2019-09-01 | Payer: MEDICARE | Primary: Internal Medicine

## 2019-09-01 DIAGNOSIS — R7303 Prediabetes: Secondary | ICD-10-CM

## 2019-09-02 LAB — COMPREHENSIVE METABOLIC PANEL
ALT: 16 IU/L (ref 0–32)
AST: 16 IU/L (ref 0–40)
Albumin/Globulin Ratio: 1.6 NA (ref 1.2–2.2)
Albumin: 4.1 g/dL (ref 3.8–4.8)
Alkaline Phosphatase: 95 IU/L (ref 39–117)
BUN/Creatinine Ratio: 13 NA (ref 12–28)
BUN: 8 mg/dL (ref 8–27)
CO2: 27 mmol/L (ref 20–29)
Calcium: 9.7 mg/dL (ref 8.7–10.3)
Chloride: 100 mmol/L (ref 96–106)
Creatinine: 0.64 mg/dL (ref 0.57–1.00)
GFR African American: 105 mL/min/{1.73_m2} (ref >59)
Globulin, Total: 2.6 g/dL (ref 1.5–4.5)
Glucose: 117 mg/dL — ABNORMAL HIGH (ref 65–99)
Potassium: 3.7 mmol/L (ref 3.5–5.2)
Sodium: 142 mmol/L (ref 134–144)
Total Bilirubin: 0.3 mg/dL (ref 0.0–1.2)
Total Protein: 6.7 g/dL (ref 6.0–8.5)
eGFR NON-AA: 91 mL/min/{1.73_m2} (ref >59)

## 2019-09-02 LAB — CVD REPORT

## 2019-09-02 LAB — LIPID PANEL
Cholesterol, Total: 159 mg/dL (ref 100–199)
Cholesterol, total: 159 mg/dL (ref 100–199)
HDL Cholesterol: 61 mg/dL (ref >39)
HDL: 61 mg/dL (ref >39)
LDL Calculated: 79 mg/dL (ref 0–99)
LDL, calculated: 79 mg/dL (ref 0–99)
Triglyceride: 108 mg/dL (ref 0–149)
Triglycerides: 108 mg/dL (ref 0–149)
VLDL, calculated: 19 mg/dL (ref 5–40)
VLDL: 19 mg/dL (ref 5–40)

## 2019-09-02 LAB — HEMOGLOBIN A1C W/O EAG
Hemoglobin A1C: 5.7 % — ABNORMAL HIGH (ref 4.8–5.6)
Hemoglobin A1c: 5.7 % — ABNORMAL HIGH (ref 4.8–5.6)

## 2019-09-02 LAB — VITAMIN D 25 HYDROXY: Vit D, 25-Hydroxy: 59.2 ng/mL (ref 30.0–100.0)

## 2019-09-02 LAB — TSH 3RD GENERATION
TSH: 0.162 u[IU]/mL — ABNORMAL LOW (ref 0.450–4.500)
TSH: 0.162 u[IU]/mL — ABNORMAL LOW (ref 0.450–4.500)

## 2019-09-02 LAB — METABOLIC PANEL, COMPREHENSIVE
A-G Ratio: 1.6 (ref 1.2–2.2)
ALT (SGPT): 16 IU/L (ref 0–32)
AST (SGOT): 16 IU/L (ref 0–40)
Albumin: 4.1 g/dL (ref 3.8–4.8)
Alk. phosphatase: 95 IU/L (ref 39–117)
BUN/Creatinine ratio: 13 (ref 12–28)
BUN: 8 mg/dL (ref 8–27)
Bilirubin, total: 0.3 mg/dL (ref 0.0–1.2)
CO2: 27 mmol/L (ref 20–29)
Calcium: 9.7 mg/dL (ref 8.7–10.3)
Chloride: 100 mmol/L (ref 96–106)
Creatinine: 0.64 mg/dL (ref 0.57–1.00)
GFR est AA: 105 mL/min/{1.73_m2} (ref >59)
GFR est non-AA: 91 mL/min/{1.73_m2} (ref >59)
GLOBULIN, TOTAL: 2.6 g/dL (ref 1.5–4.5)
Glucose: 117 mg/dL — ABNORMAL HIGH (ref 65–99)
Potassium: 3.7 mmol/L (ref 3.5–5.2)
Protein, total: 6.7 g/dL (ref 6.0–8.5)
Sodium: 142 mmol/L (ref 134–144)

## 2019-09-02 LAB — VITAMIN D, 25 HYDROXY: VITAMIN D, 25-HYDROXY: 59.2 ng/mL (ref 30.0–100.0)

## 2019-09-06 ENCOUNTER — Ambulatory Visit: Admit: 2019-09-06 | Discharge: 2019-09-06 | Payer: MEDICARE | Attending: Internal Medicine | Primary: Internal Medicine

## 2019-09-06 ENCOUNTER — Ambulatory Visit: Attending: Internal Medicine | Primary: Internal Medicine

## 2019-09-06 DIAGNOSIS — I10 Essential (primary) hypertension: Secondary | ICD-10-CM

## 2019-09-06 NOTE — Progress Notes (Signed)
Patient is in the office today for a 6  month follow up.    1. Have you been to the ER, urgent care clinic since your last visit?  Hospitalized since your last visit?No    2. Have you seen or consulted any other health care providers outside of the [facility] since your last visit?  Include any pap smears or colon screening. yes, Dr. Samuel Bouche.      Verbal order read back per Dr. Shawnie Pons flu vaccine.  Patient received flu vaccine in right deltoid. Patient was observed and no signs or symptoms of allergic reaction noted at this time.  Patient tolerated well and left without complaints.  Patient received flu VIS.

## 2019-09-06 NOTE — Progress Notes (Signed)
Elaine [name] is a 68 y.o.  female and presents with Hypertension; Cholesterol Problem; Other (prediabetes); Vitamin D Deficiency; Immunization/Injection (flu vaccine); Thyroid Problem; and Sleep Apnea (on CPAP)      SUBJECTIVE:  [name] HTN is well controlled on   DiovanHct 160/25 and Coreg 6.25 mg BID   [name] high cholesterol has been borderline controlled on Lipitor 40 mg. Pt  denies any side effects like myalgias. Compliance with medications has been an issue.  Pt has been controlling her prediabetes with diet and weight loss.      Thyroid Review:  Patient is seen for followup of hypothyroidism.   Thyroid ROS: denies fatigue, weight changes, heat/cold intolerance, bowel/skin changes or CVS symptoms. 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  Her TSH levels are too low on Synthroid 150 mcg daily so will missed the weekend dose on Saturday and Sunday.    [name] vit D level is well controlled on vit D 2000 units/day.     Patient has sleep apnea and is using a CPAP machine and followed by sleep specialist    She continues to have fungal infection in her toenails which is slowly improving with Lamisil.     Patient has asthma that is controlled on Singulair and Arnuity Ellipta/Advair.       Respiratory ROS: negative for - shortness of breath  Cardiovascular ROS: negative for - chest pain    Current Outpatient Medications   Medication Sig   ??? carvediloL (COREG) 6.25 mg tablet Take 1 Tab by mouth two (2) times daily (with meals).   ??? gabapentin (NEURONTIN) 300 mg capsule take 1 capsule by mouth every morning and 2 at bedtime   ??? atorvastatin (LIPITOR) 40 mg tablet Take 1 Tab by mouth daily.   ??? levothyroxine (SYNTHROID) 150 mcg tablet Take 1 Tab by mouth Daily (before breakfast).   ??? montelukast (SINGULAIR) 10 mg tablet Take 1 Tab by mouth nightly. TAKE 1 TABLET BY MOUTH AT BEDTIME IF NEEDED   ??? terbinafine HCL (LAMISIL) 250 mg  tablet take 1 tablet by mouth once daily   ??? valsartan-hydroCHLOROthiazide (DIOVAN-HCT) 160-25 mg per tablet Take 1 Tab by mouth daily.   ??? omeprazole (PRILOSEC) 20 mg capsule Take 1 Cap by mouth two (2) times a day.   ??? albuterol (PROVENTIL HFA, VENTOLIN HFA, PROAIR HFA) 90 mcg/actuation inhaler Take 2 Puffs by inhalation every four (4) hours as needed for Wheezing.   ??? OTHER Latanoprost .005% one drop each eye daily   ??? fluticasone furoate (Arnuity Ellipta) 200 mcg/actuation dsdv inhaler Take 1 Puff by inhalation daily.   ??? OTHER 1 Tab daily. Align Probiotic   ??? fluticasone propion-salmeteroL (Advair Diskus) 250-50 mcg/dose diskus inhaler Take 1 Puff by inhalation two (2) times a day.   ??? potassium chloride (KLOR-CON) 10 mEq tablet take 1 tablet by mouth once daily   ??? latanoprost (XALATAN) 0.005 % ophthalmic solution Administer 1 Drop to both eyes nightly.   ??? multivitamin (ONE A DAY) tablet Take 1 Tab by mouth daily.   ??? Cholecalciferol, Vitamin D3, (VITAMIN D-3) 5,000 unit Tab Take  by mouth daily.     No current facility-administered medications for this visit.          OBJECTIVE:  alert, well appearing, and in no distress  Visit Vitals  BP 133/64 (BP 1 Location: Left arm, BP Patient Position: Sitting)   Pulse 85   Temp   97.7 ??F (36.5 ??C) (Temporal)   Resp 20   Ht 5' 2" (1.575 m)   Wt 196 lb (88.9 kg)   SpO2 97%   BMI 35.85 kg/m??      well developed and well nourished  Chest - clear to auscultation, no wheezes, rales or rhonchi, symmetric air entry  Heart - normal rate, regular rhythm, normal S1, S2, no murmurs, rubs, clicks or gallops  Extremities - peripheral pulses normal, no pedal edema, no clubbing or cyanosis    Labs:   Lab Results   Component Value Date/Time    Cholesterol, total 159 09/01/2019 03:12 AM    HDL Cholesterol 61 09/01/2019 03:12 AM    LDL, calculated 104 (H) 06/06/2019 09:38 AM    LDL Chol Calc (NIH) 79 09/01/2019 03:12 AM    Triglyceride 108 09/01/2019 03:12 AM    CHOL/HDL Ratio 3.2  10/10/2018 03:55 PM     Lab Results   Component Value Date/Time    Sodium 142 09/01/2019 03:12 AM    Potassium 3.7 09/01/2019 03:12 AM    Chloride 100 09/01/2019 03:12 AM    CO2 27 09/01/2019 03:12 AM    Anion gap 3 10/10/2018 03:55 PM    Glucose 117 (H) 09/01/2019 03:12 AM    BUN 8 09/01/2019 03:12 AM    Creatinine 0.64 09/01/2019 03:12 AM    BUN/Creatinine ratio 13 09/01/2019 03:12 AM    GFR est AA 105 09/01/2019 03:12 AM    GFR est non-AA 91 09/01/2019 03:12 AM    Calcium 9.7 09/01/2019 03:12 AM    Bilirubin, total 0.3 09/01/2019 03:12 AM    ALT (SGPT) 16 09/01/2019 03:12 AM    Alk. phosphatase 95 09/01/2019 03:12 AM    Protein, total 6.7 09/01/2019 03:12 AM    Albumin 4.1 09/01/2019 03:12 AM    Globulin 3.1 10/10/2018 03:55 PM    A-G Ratio 1.6 09/01/2019 03:12 AM      Lab Results   Component Value Date/Time    Hemoglobin A1c 5.7 (H) 09/01/2019 03:12 AM      Labs:   Lab Results   Component Value Date/Time    TSH 0.162 (L) 09/01/2019 03:12 AM    T4, Free 1.3 03/29/2017 05:30 PM          Discussed the patient's BMI with her.  The BMI follow up plan is as follows: I have counseled this patient on diet and exercise regimens.        Assessment/Plan      ICD-10-CM ICD-9-CM    1. HTN (hypertension), benign  I10 401.1  well controlled on Diovan HCT and Coreg METABOLIC PANEL, COMPREHENSIVE   2. Pure hypercholesterolemia  E78.00 272.0  fairly well-controlled on Lipitor 40 mg LIPID PANEL   3. 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 Vitamin D deficiency  E55.9 268.9  well-controlled on current supplementation VITAMIN D, 25 HYDROXY   5. Prediabetes  R73.03 790.29  patient trying to control with diet and weight loss HEMOGLOBIN A1C W/O EAG   6. Mild persistent asthma without complication  J45.30 493.90  controlled on steroid inhaler   7. Severe obesity (BMI 35.0-39.9) with comorbidity (HCC)  E66.01 278.01   patient will continue to work on trying to lose weight by cutting back starches and sweets in her diet   8. Needs flu shot  Z23 V04.81   FLU (FLUAD QUAD INFLUENZA VACCINE,QUAD,ADJUVANTED)         Follow-up and Dispositions    ?? Return in about 6 months (around 03/05/2020) for OV, and Medicare Wellness Visit, labs 1 week before.           Reviewed plan of care. Patient has provided input and agrees with goals.

## 2019-09-06 NOTE — Progress Notes (Addendum)
Patient is in the office today for a 6  month follow up.    1. Have you been to the ER, urgent care clinic since your last visit?  Hospitalized since your last visit?No    2. Have you seen or consulted any other health care providers outside of the [facility] since your last visit?  Include any pap smears or colon screening. yes, Dr. Karle Barr.      Verbal order read back per Dr. Madelyn Brunner flu vaccine.  Patient received flu vaccine in right deltoid. Patient was observed and no signs or symptoms of allergic reaction noted at this time.  Patient tolerated well and left without complaints.  Patient received flu VIS.

## 2019-09-06 NOTE — Patient Instructions (Addendum)
High Blood Pressure: Care Instructions  Overview     It's normal for blood pressure to go up and down throughout the day. But if it stays up, you have high blood pressure. Another name for high blood pressure is hypertension.  Despite what a lot of people think, high blood pressure usually doesn't cause headaches or make you feel dizzy or lightheaded. It usually has no symptoms. But it does increase your risk of stroke, heart attack, and other problems. You and your doctor will talk about your risks of these problems based on your blood pressure.  Your doctor will give you a goal for your blood pressure. Your goal will be based on your health and your age.  Lifestyle changes, such as eating healthy and being active, are always important to help lower blood pressure. You might also take medicine to reach your blood pressure goal.  Follow-up care is a key part of your treatment and safety. Be sure to make and go to all appointments, and call your doctor if you are having problems. It's also a good idea to know your test results and keep a list of the medicines you take.  How can you care for yourself at home?  Medical treatment  ?? If you stop taking your medicine, your blood pressure will go back up. You may take one or more types of medicine to lower your blood pressure. Be safe with medicines. Take your medicine exactly as prescribed. Call your doctor if you think you are having a problem with your medicine.  ?? Talk to your doctor before you start taking aspirin every day. Aspirin can help certain people lower their risk of a heart attack or stroke. But taking aspirin isn't right for everyone, because it can cause serious bleeding.  ?? See your doctor regularly. You may need to see the doctor more often at first or until your blood pressure comes down.  ?? If you are taking blood pressure medicine, talk to your doctor before you take decongestants or anti-inflammatory medicine, such as ibuprofen.  Some of these medicines can raise blood pressure.  ?? Learn how to check your blood pressure at home.  Lifestyle changes  ?? Stay at a healthy weight. This is especially important if you put on weight around the waist. Losing even 10 pounds can help you lower your blood pressure.  ?? If your doctor recommends it, get more exercise. Walking is a good choice. Bit by bit, increase the amount you walk every day. Try for at least 30 minutes on most days of the week. You also may want to swim, bike, or do other activities.  ?? Avoid or limit alcohol. Talk to your doctor about whether you can drink any alcohol.  ?? Try to limit how much sodium you eat to less than 2,300 milligrams (mg) a day. Your doctor may ask you to try to eat less than 1,500 mg a day.  ?? Eat plenty of fruits (such as bananas and oranges), vegetables, legumes, whole grains, and low-fat dairy products.  ?? Lower the amount of saturated fat in your diet. Saturated fat is found in animal products such as milk, cheese, and meat. Limiting these foods may help you lose weight and also lower your risk for heart disease.  ?? Do not smoke. Smoking increases your risk for heart attack and stroke. If you need help quitting, talk to your doctor about stop-smoking programs and medicines. These can increase your chances of quitting for good.    When should you call for help?   Call  911 anytime you think you may need emergency care. This may mean having symptoms that suggest that your blood pressure is causing a serious heart or blood vessel problem. Your blood pressure may be over 180/120.  For example, call 911 if:  ?? ?? You have symptoms of a heart attack. These may include:  ? Chest pain or pressure, or a strange feeling in the chest.  ? Sweating.  ? Shortness of breath.  ? Nausea or vomiting.  ? Pain, pressure, or a strange feeling in the back, neck, jaw, or upper belly or in one or both shoulders or arms.  ? Lightheadedness or sudden weakness.   ? A fast or irregular heartbeat.   ?? ?? You have symptoms of a stroke. These may include:  ? Sudden numbness, tingling, weakness, or loss of movement in your face, arm, or leg, especially on only one side of your body.  ? Sudden vision changes.  ? Sudden trouble speaking.  ? Sudden confusion or trouble understanding simple statements.  ? Sudden problems with walking or balance.  ? A sudden, severe headache that is different from past headaches.   ?? ?? You have severe back or belly pain.   Do not wait until your blood pressure comes down on its own. Get help right away.  Call your doctor now or seek immediate care if:  ?? ?? Your blood pressure is much higher than normal (such as 180/120 or higher), but you don't have symptoms.   ?? ?? You think high blood pressure is causing symptoms, such as:  ? Severe headache.  ? Blurry vision.   Watch closely for changes in your health, and be sure to contact your doctor if:  ?? ?? Your blood pressure measures higher than your doctor recommends at least 2 times. That means the top number is higher or the bottom number is higher, or both.   ?? ?? You think you may be having side effects from your blood pressure medicine.   Where can you learn more?  Go to https://www.healthwise.net/[facility]  Enter X567 in the search box to learn more about "High Blood Pressure: Care Instructions."  Current as of: November 21, 2018??????????????????????????????Content Version: 12.6  ?? 2006-2020 [employer].   Care instructions adapted under license by [employer] [facility] (which disclaims liability or warranty for this information). If you have questions about a medical condition or this instruction, always ask your healthcare professional. [employer] disclaims any warranty or liability for your use of this information.         Influenza (Flu) Vaccine (Inactivated or Recombinant): What You Need to Know  Why get vaccinated?  Influenza vaccine can prevent influenza (flu).   Flu is a contagious disease that spreads around the [place] every year, usually between October and May. Anyone can get the flu, but it is more dangerous for some people. Infants and young children, people 65 years of age and older, pregnant women, and people with certain health conditions or a weakened immune system are at greatest risk of flu complications.  Pneumonia, bronchitis, sinus infections and ear infections are examples of flu-related complications. If you have a medical condition, such as heart disease, cancer or diabetes, flu can make it worse.  Flu can cause fever and chills, sore throat, muscle aches, fatigue, cough, headache, and runny or stuffy nose. Some people may have vomiting and diarrhea, though this is more common in children than   adults.  Each year, thousands of people in the [place] die from flu, and many more are hospitalized. Flu vaccine prevents millions of illnesses and flu-related visits to the doctor each year.  Influenza vaccine  CDC recommends everyone 6 months of age and older get vaccinated every flu season. Children 6 months through 8 years of age may need 2 doses during a single flu season. Everyone else needs only 1 dose each flu season.  It takes about 2 weeks for protection to develop after vaccination.  There are many flu viruses, and they are always changing. Each year a new flu vaccine is made to protect against three or four viruses that are likely to cause disease in the upcoming flu season. Even when the vaccine doesn't exactly match these viruses, it may still provide some protection.  Influenza vaccine does not cause flu.  Influenza vaccine may be given at the same time as other vaccines.  Talk with your health care provider  Tell your vaccine provider if the person getting the vaccine:  ?? Has had an allergic reaction after a previous dose of influenza vaccine, or has any severe, life-threatening allergies.   ?? Has ever had Guillain-Barr?? Syndrome (also called GBS).  In some cases, your health care provider may decide to postpone influenza vaccination to a future visit.  People with minor illnesses, such as a cold, may be vaccinated. People who are moderately or severely ill should usually wait until they recover before getting influenza vaccine.  Your health care provider can give you more information.  Risks of a vaccine reaction  ?? Soreness, redness, and swelling where shot is given, fever, muscle aches, and headache can happen after influenza vaccine.  ?? There may be a very small increased risk of Guillain-Barr?? Syndrome (GBS) after inactivated influenza vaccine (the flu shot).  Young children who get the flu shot along with pneumococcal vaccine (PCV13), and/or DTaP vaccine at the same time might be slightly more likely to have a seizure caused by fever. Tell your health care provider if a child who is getting flu vaccine has ever had a seizure.  People sometimes faint after medical procedures, including vaccination. Tell your provider if you feel dizzy or have vision changes or ringing in the ears.  As with any medicine, there is a very remote chance of a vaccine causing a severe allergic reaction, other serious injury, or death.  What if there is a serious problem?  An allergic reaction could occur after the vaccinated person leaves the clinic. If you see signs of a severe allergic reaction (hives, swelling of the face and throat, difficulty breathing, a fast heartbeat, dizziness, or weakness), call 9-1-1 and get the person to the nearest hospital.  For other signs that concern you, call your health care provider.  Adverse reactions should be reported to the Vaccine Adverse Event Reporting System (VAERS). Your health care provider will usually file this report, or you can do it yourself. Visit the VAERS website at www.vaers.hhs.gov or call 1-800-822-7967. VAERS is only for reporting  reactions, and VAERS staff do not give medical advice.  The National Vaccine Injury Compensation Program  The National Vaccine Injury Compensation Program (VICP) is a federal program that was created to compensate people who may have been injured by certain vaccines. Visit the VICP website at www.hrsa.gov/vaccinecompensation or call 1-800-338-2382 to learn about the program and about filing a claim. There is a time limit to file a claim for compensation.  How can I   learn more?  ?? Ask your healthcare provider.  ?? Call your local or state health department.  ?? Contact the Centers for Disease Control and Prevention (CDC):  ? Call 1-800-232-4636 (1-800-CDC-INFO) or  ? Visit CDC's website at www.cdc.gov/flu  Vaccine Information Statement (Interim)  Inactivated Influenza Vaccine  07/21/2018  42 [place] ?? 300aa-26  Department of Health and Human Services  Centers for Disease Control and Prevention  Many Vaccine Information Statements are available in Spanish and other languages. See www.immunize.org/vis.  Muchas hojas de informaci??n sobre vacunas est??n disponibles en espa??ol y en otros idiomas. Visite www.immunize.org/vis.  Care instructions adapted under license by [employer] [facility] (which disclaims liability or warranty for this information). If you have questions about a medical condition or this instruction, always ask your healthcare professional. [employer] disclaims any warranty or liability for your use of this information.

## 2019-09-06 NOTE — Progress Notes (Signed)
Elaine [name] is a 68 y.o.  female and presents with Hypertension; Cholesterol Problem; Other (prediabetes); Vitamin D Deficiency; Immunization/Injection (flu vaccine); Thyroid Problem; and Sleep Apnea (on CPAP)      SUBJECTIVE:  [name] HTN is well controlled on   DiovanHct 160/25 and Coreg 6.25 mg BID   [name] high cholesterol has been borderline controlled on Lipitor 40 mg. Pt  denies any side effects like myalgias. Compliance with medications has been an issue.  Pt has been controlling her prediabetes with diet and weight loss.      Thyroid Review:  Patient is seen for followup of hypothyroidism.   Thyroid ROS: denies fatigue, weight changes, heat/cold intolerance, bowel/skin changes or CVS symptoms. 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  Her TSH levels are too low on Synthroid 150 mcg daily so will missed the weekend dose on Saturday and Sunday.    [name] vit D level is well controlled on vit D 2000 units/day.     Patient has sleep apnea and is using a CPAP machine and followed by sleep specialist    She continues to have fungal infection in her toenails which is slowly improving with Lamisil.     Patient has asthma that is controlled on Singulair and Arnuity Ellipta/Advair.       Respiratory ROS: negative for - shortness of breath  Cardiovascular ROS: negative for - chest pain    Current Outpatient Medications   Medication Sig   ??? carvediloL (COREG) 6.25 mg tablet Take 1 Tab by mouth two (2) times daily (with meals).   ??? gabapentin (NEURONTIN) 300 mg capsule take 1 capsule by mouth every morning and 2 at bedtime   ??? atorvastatin (LIPITOR) 40 mg tablet Take 1 Tab by mouth daily.   ??? levothyroxine (SYNTHROID) 150 mcg tablet Take 1 Tab by mouth Daily (before breakfast).   ??? montelukast (SINGULAIR) 10 mg tablet Take 1 Tab by mouth nightly. TAKE 1 TABLET BY MOUTH AT BEDTIME IF NEEDED   ??? terbinafine HCL (LAMISIL) 250 mg  tablet take 1 tablet by mouth once daily   ??? valsartan-hydroCHLOROthiazide (DIOVAN-HCT) 160-25 mg per tablet Take 1 Tab by mouth daily.   ??? omeprazole (PRILOSEC) 20 mg capsule Take 1 Cap by mouth two (2) times a day.   ??? albuterol (PROVENTIL HFA, VENTOLIN HFA, PROAIR HFA) 90 mcg/actuation inhaler Take 2 Puffs by inhalation every four (4) hours as needed for Wheezing.   ??? OTHER Latanoprost .005% one drop each eye daily   ??? fluticasone furoate (Arnuity Ellipta) 200 mcg/actuation dsdv inhaler Take 1 Puff by inhalation daily.   ??? OTHER 1 Tab daily. Align Probiotic   ??? fluticasone propion-salmeteroL (Advair Diskus) 250-50 mcg/dose diskus inhaler Take 1 Puff by inhalation two (2) times a day.   ??? potassium chloride (KLOR-CON) 10 mEq tablet take 1 tablet by mouth once daily   ??? latanoprost (XALATAN) 0.005 % ophthalmic solution Administer 1 Drop to both eyes nightly.   ??? multivitamin (ONE A DAY) tablet Take 1 Tab by mouth daily.   ??? Cholecalciferol, Vitamin D3, (VITAMIN D-3) 5,000 unit Tab Take  by mouth daily.     No current facility-administered medications for this visit.          OBJECTIVE:  alert, well appearing, and in no distress  Visit Vitals  BP 133/64 (BP 1 Location: Left arm, BP Patient Position: Sitting)   Pulse 85   Temp  97.7 ??F (36.5 ??C) (Temporal)   Resp 20   Ht 5' 2" (1.575 m)   Wt 196 lb (88.9 kg)   SpO2 97%   BMI 35.85 kg/m??      well developed and well nourished  Chest - clear to auscultation, no wheezes, rales or rhonchi, symmetric air entry  Heart - normal rate, regular rhythm, normal S1, S2, no murmurs, rubs, clicks or gallops  Extremities - peripheral pulses normal, no pedal edema, no clubbing or cyanosis    Labs:   Lab Results   Component Value Date/Time    Cholesterol, total 159 09/01/2019 03:12 AM    HDL Cholesterol 61 09/01/2019 03:12 AM    LDL, calculated 104 (H) 06/06/2019 09:38 AM    LDL Chol Calc (NIH) 79 09/01/2019 03:12 AM    Triglyceride 108 09/01/2019 03:12 AM    CHOL/HDL Ratio 3.2  10/10/2018 03:55 PM     Lab Results   Component Value Date/Time    Sodium 142 09/01/2019 03:12 AM    Potassium 3.7 09/01/2019 03:12 AM    Chloride 100 09/01/2019 03:12 AM    CO2 27 09/01/2019 03:12 AM    Anion gap 3 10/10/2018 03:55 PM    Glucose 117 (H) 09/01/2019 03:12 AM    BUN 8 09/01/2019 03:12 AM    Creatinine 0.64 09/01/2019 03:12 AM    BUN/Creatinine ratio 13 09/01/2019 03:12 AM    GFR est AA 105 09/01/2019 03:12 AM    GFR est non-AA 91 09/01/2019 03:12 AM    Calcium 9.7 09/01/2019 03:12 AM    Bilirubin, total 0.3 09/01/2019 03:12 AM    ALT (SGPT) 16 09/01/2019 03:12 AM    Alk. phosphatase 95 09/01/2019 03:12 AM    Protein, total 6.7 09/01/2019 03:12 AM    Albumin 4.1 09/01/2019 03:12 AM    Globulin 3.1 10/10/2018 03:55 PM    A-G Ratio 1.6 09/01/2019 03:12 AM      Lab Results   Component Value Date/Time    Hemoglobin A1c 5.7 (H) 09/01/2019 03:12 AM      Labs:   Lab Results   Component Value Date/Time    TSH 0.162 (L) 09/01/2019 03:12 AM    T4, Free 1.3 03/29/2017 05:30 PM          Discussed the patient's BMI with her.  The BMI follow up plan is as follows: I have counseled this patient on diet and exercise regimens.        Assessment/Plan      ICD-10-CM ICD-9-CM    1. HTN (hypertension), benign  I10 401.1  well controlled on Diovan HCT and Coreg METABOLIC PANEL, COMPREHENSIVE   2. Pure hypercholesterolemia  E78.00 272.0  fairly well-controlled on Lipitor 40 mg LIPID PANEL   3. 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 Vitamin D deficiency  E55.9 268.9  well-controlled on current supplementation VITAMIN D, 25 HYDROXY   5. Prediabetes  R73.03 790.29  patient trying to control with diet and weight loss HEMOGLOBIN A1C W/O EAG   6. Mild persistent asthma without complication  O03.70 488.89  controlled on steroid inhaler   7. Severe obesity (BMI 35.0-39.9) with comorbidity ([place])  E66.01 278.01   patient will continue to work on trying to lose weight by cutting back starches and sweets in her diet   8. Needs flu shot  Z23 V04.81  FLU (FLUAD QUAD INFLUENZA VACCINE,QUAD,ADJUVANTED)         Follow-up and Dispositions    ?? Return in about 6 months (around 03/05/2020) for OV, and Medicare Wellness Visit, labs 1 week before.           Reviewed plan of care. Patient has provided input and agrees with goals.

## 2019-09-15 DIAGNOSIS — Z7984 Long term (current) use of oral hypoglycemic drugs: Secondary | ICD-10-CM | POA: Diagnosis not present

## 2019-09-15 DIAGNOSIS — I10 Essential (primary) hypertension: Secondary | ICD-10-CM | POA: Diagnosis not present

## 2019-09-15 DIAGNOSIS — M109 Gout, unspecified: Secondary | ICD-10-CM | POA: Diagnosis not present

## 2019-09-15 DIAGNOSIS — E1122 Type 2 diabetes mellitus with diabetic chronic kidney disease: Secondary | ICD-10-CM | POA: Diagnosis not present

## 2019-09-15 DIAGNOSIS — Z1211 Encounter for screening for malignant neoplasm of colon: Secondary | ICD-10-CM | POA: Diagnosis not present

## 2019-09-15 DIAGNOSIS — E1169 Type 2 diabetes mellitus with other specified complication: Secondary | ICD-10-CM | POA: Diagnosis not present

## 2019-09-15 DIAGNOSIS — K219 Gastro-esophageal reflux disease without esophagitis: Secondary | ICD-10-CM | POA: Diagnosis not present

## 2019-09-15 DIAGNOSIS — E785 Hyperlipidemia, unspecified: Secondary | ICD-10-CM | POA: Diagnosis not present

## 2019-10-06 ENCOUNTER — Ambulatory Visit: Primary: Internal Medicine

## 2019-10-10 MED ORDER — MONTELUKAST 10 MG TAB
10 mg | ORAL_TABLET | ORAL | 1 refills | Status: DC
Start: 2019-10-10 — End: 2020-06-11

## 2019-10-23 DIAGNOSIS — M109 Gout, unspecified: Secondary | ICD-10-CM | POA: Diagnosis not present

## 2019-10-23 DIAGNOSIS — E1169 Type 2 diabetes mellitus with other specified complication: Secondary | ICD-10-CM | POA: Diagnosis not present

## 2019-10-23 DIAGNOSIS — I10 Essential (primary) hypertension: Secondary | ICD-10-CM | POA: Diagnosis not present

## 2019-10-31 ENCOUNTER — Encounter

## 2019-10-31 MED ORDER — TERBINAFINE 250 MG TAB
250 mg | ORAL_TABLET | ORAL | 0 refills | Status: DC
Start: 2019-10-31 — End: 2019-11-16

## 2019-11-09 LAB — COVID-19: SARS-CoV-2, NAA: POSITIVE

## 2019-11-09 LAB — NOVEL CORONAVIRUS (COVID-19): SARS-CoV-2, NAA: POSITIVE

## 2019-11-10 NOTE — Telephone Encounter (Signed)
She can do virtual visit for next week

## 2019-11-10 NOTE — Telephone Encounter (Signed)
See what her concerns are

## 2019-11-10 NOTE — Telephone Encounter (Signed)
Pt went to urgent care yesterday and tested positive for covid, she  has question concerning her care asking if someone would call her back please advise

## 2019-11-10 NOTE — Telephone Encounter (Signed)
Patient states she was diagnosed with COVID 77 yesterday and she needs a letter to Return to work on 11/23/2019.  Please advise. Patient is aware she will need to have a virtual appointment.

## 2019-11-13 NOTE — Telephone Encounter (Signed)
Tried to offer an appointment to patient she declined tomorrow 11/14/2019 VV stating she does not know if her job will except that.  Patient was advised it is not up to her job, per office policy her visit will have to be virtual.  Patient states she will get in touch with her job and call us back tomorrow.

## 2019-11-13 NOTE — Telephone Encounter (Signed)
No openings please advise what to offer her

## 2019-11-15 NOTE — Telephone Encounter (Signed)
Patient scheduled for VV 11/15/2019

## 2019-11-15 NOTE — Telephone Encounter (Signed)
Pt returning call to [place]. Says she does need appt to get the note. Is there a time Dr. Madelyn Brunner can fit her in?

## 2019-11-16 ENCOUNTER — Encounter

## 2019-11-16 ENCOUNTER — Telehealth: Admit: 2019-11-16 | Discharge: 2019-11-16 | Payer: MEDICARE | Attending: Internal Medicine | Primary: Internal Medicine

## 2019-11-16 ENCOUNTER — Encounter: Attending: Internal Medicine | Primary: Internal Medicine

## 2019-11-16 DIAGNOSIS — J069 Acute upper respiratory infection, unspecified: Secondary | ICD-10-CM

## 2019-11-16 DIAGNOSIS — U071 COVID-19: Secondary | ICD-10-CM

## 2019-11-16 MED ORDER — TERBINAFINE 250 MG TAB
250 mg | ORAL_TABLET | ORAL | 0 refills | Status: DC
Start: 2019-11-16 — End: 2020-05-25

## 2019-11-16 NOTE — Progress Notes (Signed)
Elaine [name] 68 y.o. female who was seen by synchronous (real-time) audio-video technology on 11/16/19    Consent:  sheand/or her healthcare decision maker is aware that this patient-initiated Telehealth encounter is a billable service, with coverage as determined by her insurance carrier. she is aware that she may receive a bill and has provided verbal consent to proceed: Yes I was in my office while conducting this encounter. The patient was in their home.     Elaine [name] is a 68 y.o.  female and presents with Positive For Covid-19 and Cough      SUBJECTIVE:    Pt on 11/07/19 had fatigue, myalgias and chills and went to Urgent care and was diagnosed with COVID-19. She also had significant cough and fever to 100.6. pt is doing better now and never had to go to the Hospital for breathing problems despite having Asthma. She has been using Zinc, Vit D and vit D.     Respiratory ROS: negative for - shortness of breath  Cardiovascular ROS: negative for - chest pain    Current Outpatient Medications   Medication Sig   ??? terbinafine HCL (LAMISIL) 250 mg tablet take 1 tablet by mouth once daily   ??? montelukast (SINGULAIR) 10 mg tablet take 1 tablet by mouth at bedtime if needed   ??? carvediloL (COREG) 6.25 mg tablet Take 1 Tab by mouth two (2) times daily (with meals).   ??? gabapentin (NEURONTIN) 300 mg capsule take 1 capsule by mouth every morning and 2 at bedtime   ??? atorvastatin (LIPITOR) 40 mg tablet Take 1 Tab by mouth daily.   ??? levothyroxine (SYNTHROID) 150 mcg tablet Take 1 Tab by mouth Daily (before breakfast).   ??? valsartan-hydroCHLOROthiazide (DIOVAN-HCT) 160-25 mg per tablet Take 1 Tab by mouth daily.   ??? omeprazole (PRILOSEC) 20 mg capsule Take 1 Cap by mouth two (2) times a day.   ??? albuterol (PROVENTIL HFA, VENTOLIN HFA, PROAIR HFA) 90 mcg/actuation inhaler Take 2 Puffs by inhalation every four (4) hours as needed for Wheezing.   ??? OTHER Latanoprost .005% one drop each eye daily    ??? fluticasone furoate (Arnuity Ellipta) 200 mcg/actuation dsdv inhaler Take 1 Puff by inhalation daily.   ??? OTHER 1 Tab daily. Align Probiotic   ??? fluticasone propion-salmeteroL (Advair Diskus) 250-50 mcg/dose diskus inhaler Take 1 Puff by inhalation two (2) times a day.   ??? potassium chloride (KLOR-CON) 10 mEq tablet take 1 tablet by mouth once daily   ??? latanoprost (XALATAN) 0.005 % ophthalmic solution Administer 1 Drop to both eyes nightly.   ??? multivitamin (ONE A DAY) tablet Take 1 Tab by mouth daily.   ??? Cholecalciferol, Vitamin D3, (VITAMIN D-3) 5,000 unit Tab Take  by mouth daily.     No current facility-administered medications for this visit.          OBJECTIVE:  alert, well appearing, and in no distress  There were no vitals taken for this visit.   well developed and well nourished            Assessment/Plan      ICD-10-CM ICD-9-CM    1. Acute respiratory disease due to COVID-19 virus  U07.1 465.9 Pt doing better and was out from 11/07/19 and will return to work 11/23/19    J06.9 079.89      Follow-up and Dispositions    ?? Return if symptoms worsen or fail to improve.  Reviewed plan of care. Patient has provided input and agrees with goals.

## 2019-11-16 NOTE — Telephone Encounter (Signed)
Patient states son Ronan Bataille will pick up letter for her.

## 2019-11-16 NOTE — Progress Notes (Signed)
Elaine [name] 68 y.o. female who was seen by synchronous (real-time) audio-video technology on 11/16/19    Consent:  sheand/or her healthcare decision maker is aware that this patient-initiated Telehealth encounter is a billable service, with coverage as determined by her insurance carrier. she is aware that she may receive a bill and has provided verbal consent to proceed: Yes I was in my office while conducting this encounter. The patient was in their home.     Elaine [name] is a 68 y.o.  female and presents with Positive For Covid-19 and Cough      SUBJECTIVE:    Pt on 11/07/19 had fatigue, myalgias and chills and went to Urgent care and was diagnosed with COVID-19. She also had significant cough and fever to 100.6. pt is doing better now and never had to go to the Hospital for breathing problems despite having Asthma. She has been using Zinc, Vit D and vit D.     Respiratory ROS: negative for - shortness of breath  Cardiovascular ROS: negative for - chest pain    Current Outpatient Medications   Medication Sig   ??? terbinafine HCL (LAMISIL) 250 mg tablet take 1 tablet by mouth once daily   ??? montelukast (SINGULAIR) 10 mg tablet take 1 tablet by mouth at bedtime if needed   ??? carvediloL (COREG) 6.25 mg tablet Take 1 Tab by mouth two (2) times daily (with meals).   ??? gabapentin (NEURONTIN) 300 mg capsule take 1 capsule by mouth every morning and 2 at bedtime   ??? atorvastatin (LIPITOR) 40 mg tablet Take 1 Tab by mouth daily.   ??? levothyroxine (SYNTHROID) 150 mcg tablet Take 1 Tab by mouth Daily (before breakfast).   ??? valsartan-hydroCHLOROthiazide (DIOVAN-HCT) 160-25 mg per tablet Take 1 Tab by mouth daily.   ??? omeprazole (PRILOSEC) 20 mg capsule Take 1 Cap by mouth two (2) times a day.   ??? albuterol (PROVENTIL HFA, VENTOLIN HFA, PROAIR HFA) 90 mcg/actuation inhaler Take 2 Puffs by inhalation every four (4) hours as needed for Wheezing.   ??? OTHER Latanoprost .005% one drop each eye daily   ??? fluticasone  furoate (Arnuity Ellipta) 200 mcg/actuation dsdv inhaler Take 1 Puff by inhalation daily.   ??? OTHER 1 Tab daily. Align Probiotic   ??? fluticasone propion-salmeteroL (Advair Diskus) 250-50 mcg/dose diskus inhaler Take 1 Puff by inhalation two (2) times a day.   ??? potassium chloride (KLOR-CON) 10 mEq tablet take 1 tablet by mouth once daily   ??? latanoprost (XALATAN) 0.005 % ophthalmic solution Administer 1 Drop to both eyes nightly.   ??? multivitamin (ONE A DAY) tablet Take 1 Tab by mouth daily.   ??? Cholecalciferol, Vitamin D3, (VITAMIN D-3) 5,000 unit Tab Take  by mouth daily.     No current facility-administered medications for this visit.          OBJECTIVE:  alert, well appearing, and in no distress  There were no vitals taken for this visit.   well developed and well nourished            Assessment/Plan      ICD-10-CM ICD-9-CM    1. Acute respiratory disease due to COVID-19 virus  U07.1 465.9 Pt doing better and was out from 11/07/19 and will return to work 11/23/19    J06.9 079.89      Follow-up and Dispositions    ?? Return if symptoms worsen or fail to improve.  Reviewed plan of care. Patient has provided input and agrees with goals.

## 2019-12-02 LAB — COVID-19: SARS-CoV-2, NAA: NOT DETECTED

## 2019-12-02 LAB — NOVEL CORONAVIRUS (COVID-19): SARS-CoV-2, NAA: NOT DETECTED

## 2019-12-17 IMAGING — US US ABDOMEN LIMITED
1 series · 14 of 25 positions shown · non-contrast
Comparison: Ultrasound 12/01/2013

CLINICAL DATA: Upper abdominal pain after eating.

EXAM:
ULTRASOUND ABDOMEN LIMITED RIGHT UPPER QUADRANT

[Series 1: us abdomen limited · 0.23mm/px · 14 of 43 slices shown]
[im 1/43]
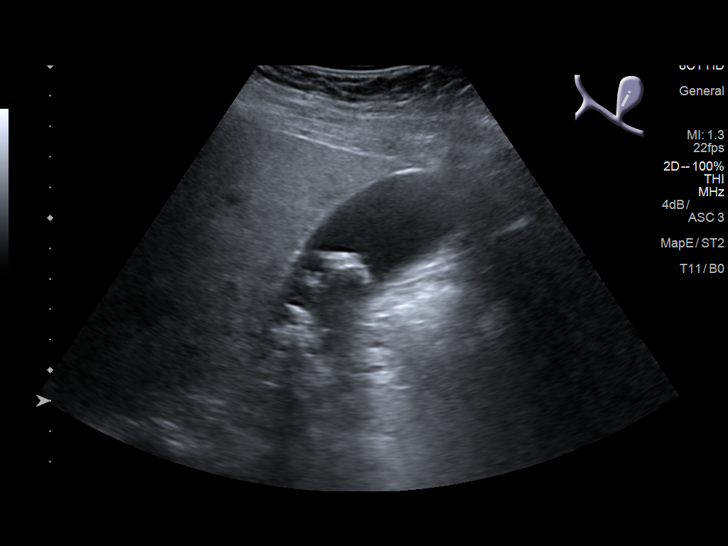
[im 4/43]
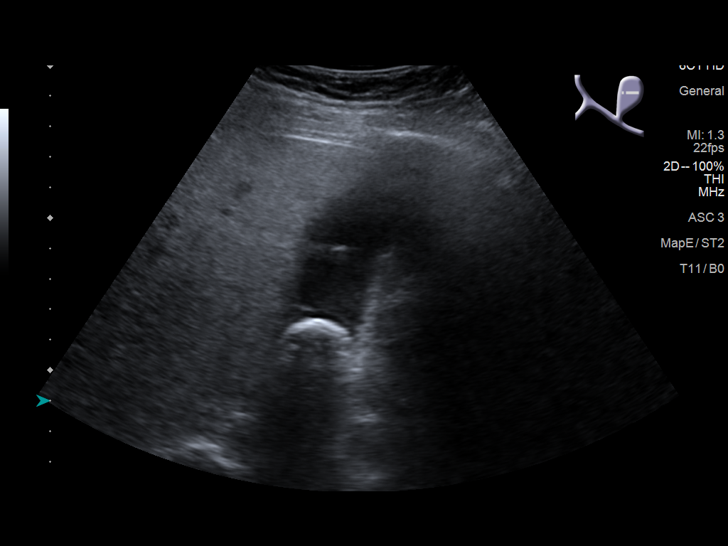
[im 8/43]
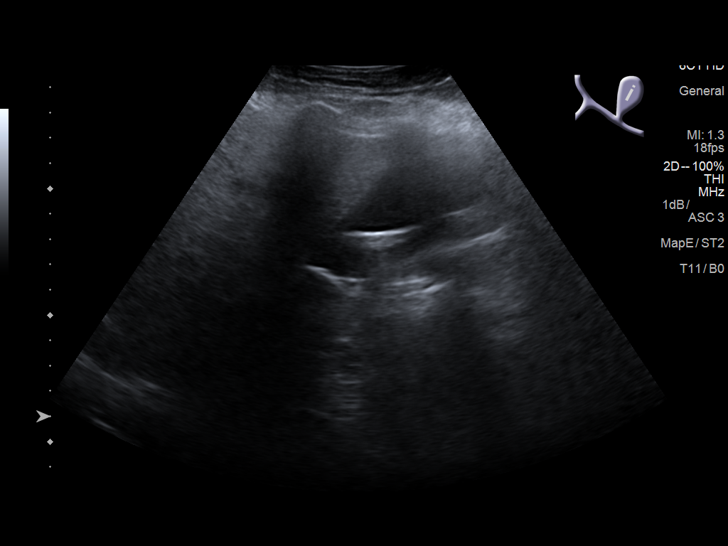
[im 11/43]
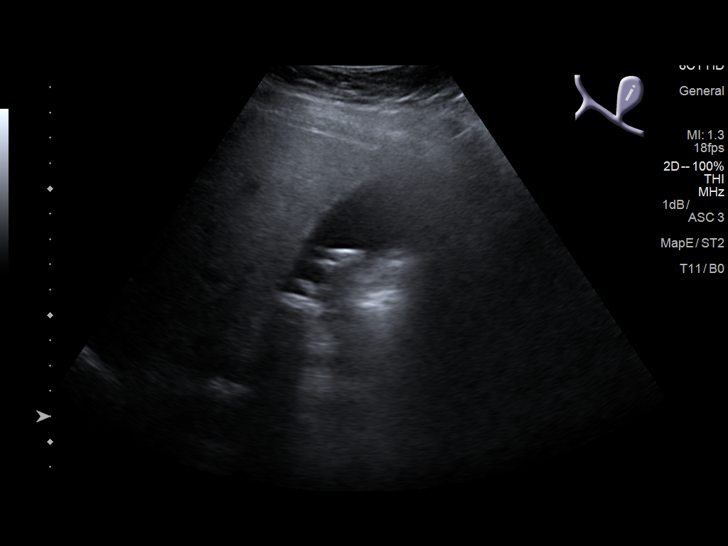
[im 15/43]
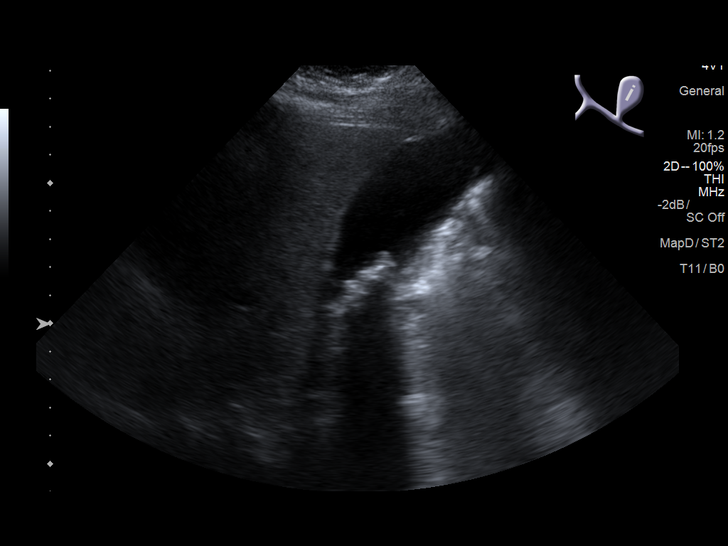
[im 16/43]
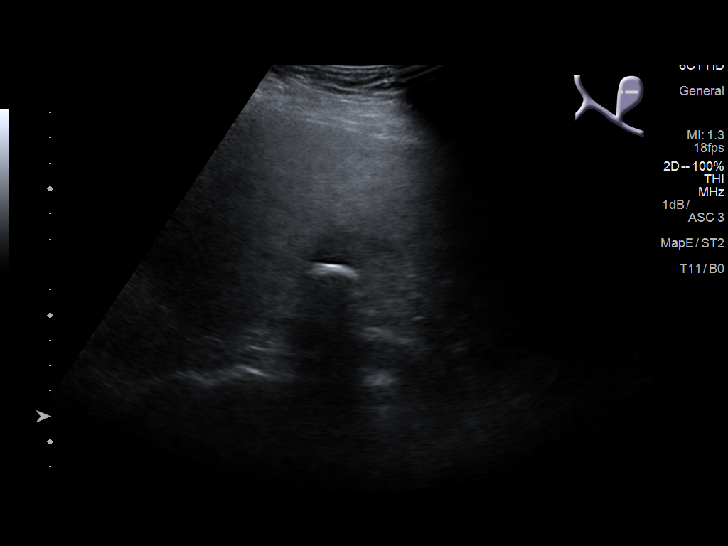
[im 20/43]
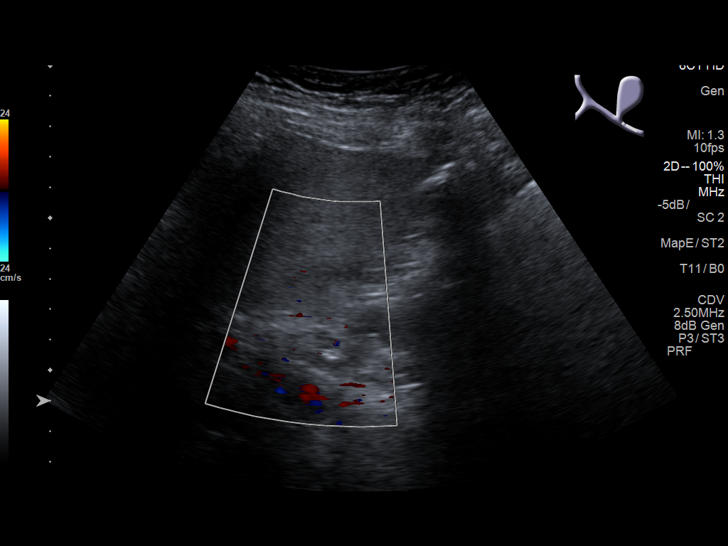
[im 23/43]
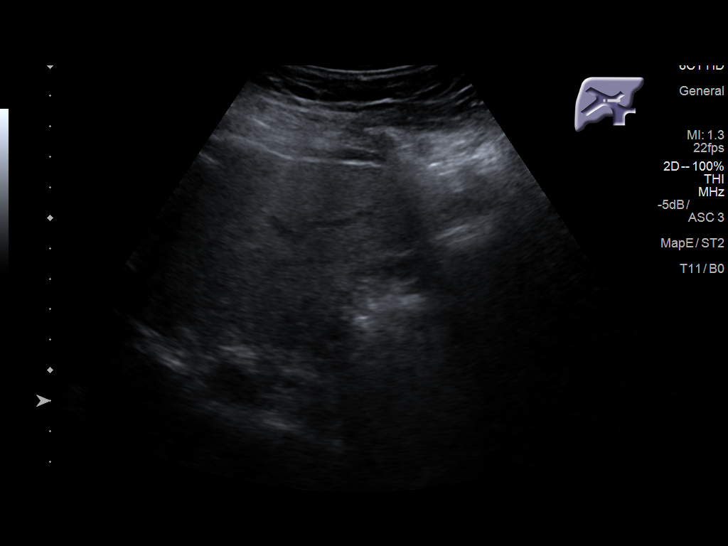
[im 27/43]
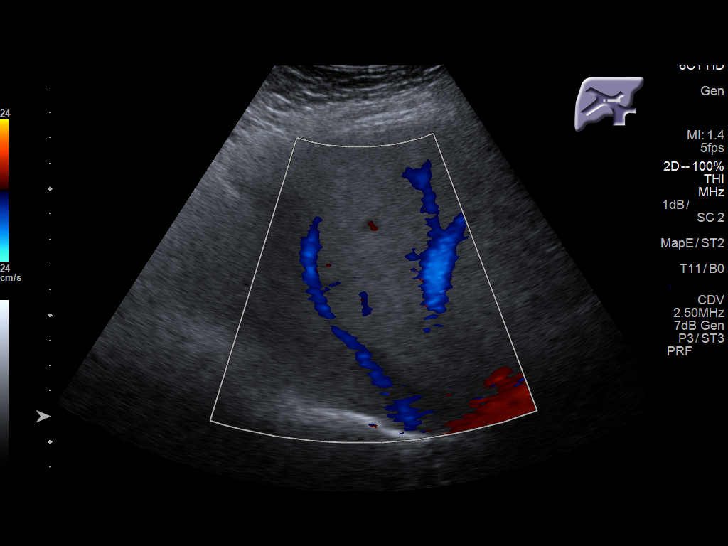
[im 29/43]
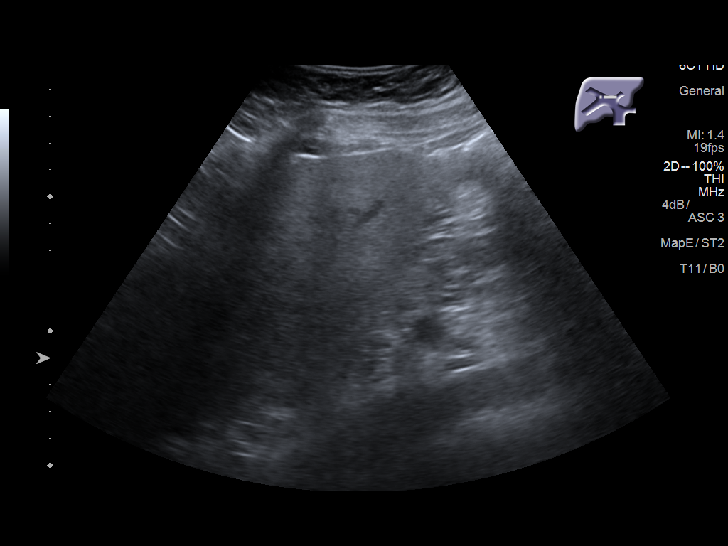
[im 32/43]
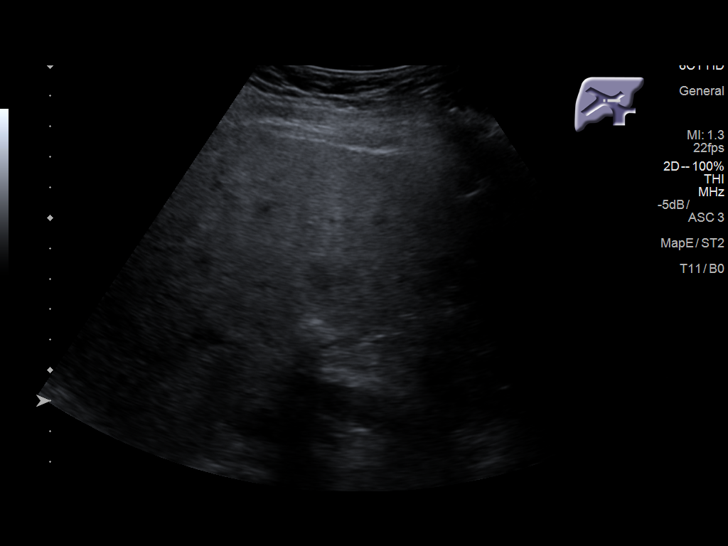
[im 36/43]
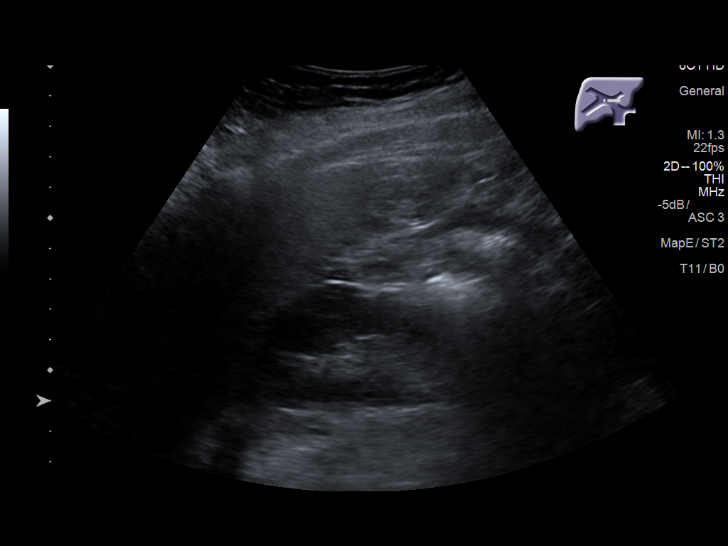
[im 39/43]
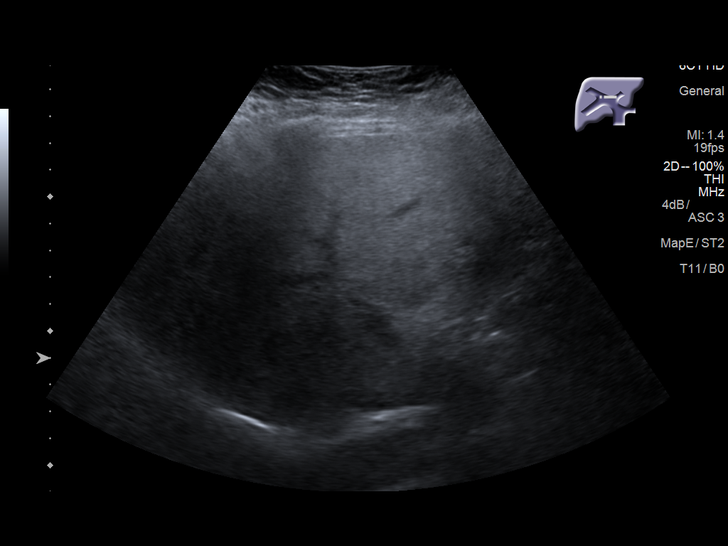
[im 43/43]
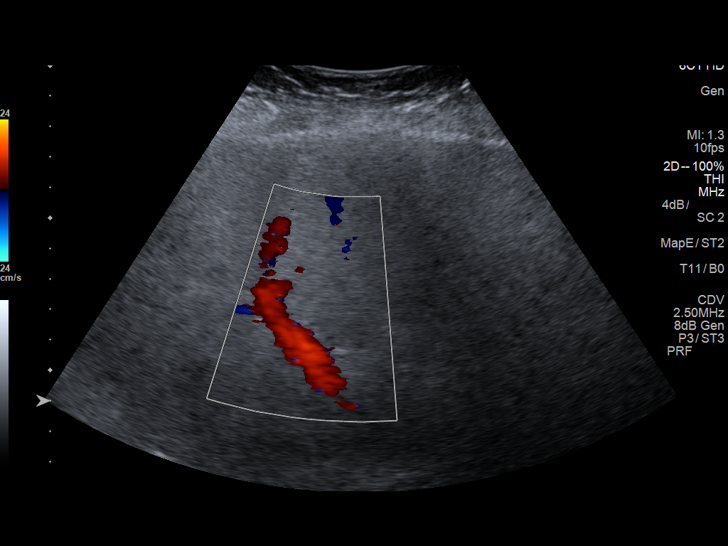

[14 of 25 positions shown; findings below may reference images not displayed]

FINDINGS: Gallbladder:

Physiologically distended containing multiple shadowing gallstones.
1.9 cm stone in the neck of the gallbladder, limited assessment for
stone mobility is patient was unable to turned prone. No gallbladder
wall thickening, normal wall thickness of 2 mm. No pericholecystic
fluid. No sonographic Murphy sign noted by sonographer.

Common bile duct:

Diameter: 5 mm, normal.

Liver:

Diffusely increased in parenchymal echogenicity. Liver parenchyma is
difficult to penetrate. No focal lesion is seen. Portal vein is
patent on color Doppler imaging with normal direction of blood flow
towards the liver.
IMPRESSION: 1. Cholelithiasis with stone in the gallbladder neck. No sonographic
findings of acute cholecystitis.
2. Hepatic steatosis.

## 2019-12-25 ENCOUNTER — Ambulatory Visit: Payer: MEDICARE | Primary: Internal Medicine

## 2019-12-25 ENCOUNTER — Inpatient Hospital Stay: Admit: 2019-12-25 | Payer: MEDICARE | Attending: Internal Medicine | Primary: Internal Medicine

## 2019-12-25 DIAGNOSIS — Z1231 Encounter for screening mammogram for malignant neoplasm of breast: Secondary | ICD-10-CM

## 2020-01-15 NOTE — Progress Notes (Signed)
Lf msg for pt to call to resch 2/11 apt and to go ahead and sched pfts since lab will be reopen

## 2020-01-17 ENCOUNTER — Encounter

## 2020-01-17 NOTE — Addendum Note (Signed)
Addendum Note by Guerry Bruin, LPN at 82/95/62 [phone]                Author: Guerry Bruin, LPN  Service: --  Author Type: Licensed Nurse       Filed: 01/17/20 0904  Encounter Date: 01/17/2020  Status: Signed          Editor: Guerry Bruin, LPN (Licensed Nurse)          Addended by: Guerry Bruin on: 01/17/2020 09:04 AM    Modules accepted: Orders

## 2020-01-17 NOTE — Progress Notes (Signed)
Order placed for covid 19 screen, per Verbal Order from Dr. Timmothy Sours on 01/17/2020.      Last office visit: 07/17/2019  Follow up Visit: 02/08/2020    Provider is aware of last office visit and follow up.  No further action requested from provider.

## 2020-01-17 NOTE — Addendum Note (Signed)
Addended by: Norlene Campbell L on: 01/17/2020 09:04 AM     Modules accepted: Orders

## 2020-01-18 ENCOUNTER — Encounter: Attending: Pulmonary Disease | Primary: Internal Medicine

## 2020-02-08 ENCOUNTER — Ambulatory Visit: Payer: Medicare Other

## 2020-02-08 ENCOUNTER — Encounter: Primary: Internal Medicine

## 2020-02-08 ENCOUNTER — Encounter: Payer: MEDICARE | Attending: Pulmonary Disease | Primary: Internal Medicine

## 2020-02-15 NOTE — Telephone Encounter (Signed)
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 Due to being asthmatic, she is unable to wear a mask for 8 hours. She wants to know if you can write a letter stating she needs to wear a face shield in stead.

## 2020-02-16 NOTE — Telephone Encounter (Signed)
Done

## 2020-02-16 NOTE — Telephone Encounter (Signed)
Patient is aware letter is ready for pick up.

## 2020-02-16 NOTE — Telephone Encounter (Signed)
Pt asking for a handicapped dmv form to be filled out and mailed to her.    Pt aware Dr. Trenton Founds of office for the week

## 2020-02-19 NOTE — Telephone Encounter (Signed)
Spoke with patient notifying her that she has testing that needs to be done prior to her appointment with Dr. Timmothy Sours. Testing reviewed patient, she verbalized understanding.

## 2020-02-20 ENCOUNTER — Ambulatory Visit: Admit: 2020-02-20 | Discharge: 2020-02-20 | Payer: MEDICARE | Attending: Pulmonary Disease | Primary: Internal Medicine

## 2020-02-20 ENCOUNTER — Ambulatory Visit: Admit: 2020-02-20 | Discharge: 2020-02-20 | Payer: MEDICARE | Primary: Internal Medicine

## 2020-02-20 ENCOUNTER — Ambulatory Visit: Attending: Pulmonary Disease | Primary: Internal Medicine

## 2020-02-20 DIAGNOSIS — R062 Wheezing: Secondary | ICD-10-CM

## 2020-02-20 MED ORDER — TRELEGY ELLIPTA 200 MCG-62.5 MCG-25 MCG POWDER FOR INHALATION
Freq: Every day | RESPIRATORY_TRACT | 0 refills | Status: DC
Start: 2020-02-20 — End: 2021-01-22

## 2020-02-20 MED ORDER — TRELEGY ELLIPTA 200 MCG-62.5 MCG-25 MCG POWDER FOR INHALATION
Freq: Every day | RESPIRATORY_TRACT | 4 refills | Status: DC
Start: 2020-02-20 — End: 2020-07-18

## 2020-02-20 NOTE — Progress Notes (Signed)
Progress Notes by Melissa Montane, DO at 02/20/20 1115                Author: Melissa Montane, DO  Service: --  Author Type: Physician       Filed: 02/24/20 0026  Encounter Date: 02/20/2020  Status: Signed          Editor: Melissa Montane, DO (Physician)                    [facility]   Pulmonary, Critical Care, and Sleep Medicine      Office Progress Note          Primary Care Physician: Thana Farr, MD        Reason for Visit:  Evaluation for Wheezing      Assessment & Plan:            Assessment:   1.  ComplexSleep Apnea (OSA): Severe: (HST: 04/07/19)- Overall AHI: 31.2  and CAI: 12.1-Reports clinical benefit from    2.  Excessive Daytime Sleepiness (EDS)/ Hypersomnia- persistent- more than likely related to untreated CSA   3.  H/O OSA- no recent PAP therapy.    4.  Nocturia- gets up to use the bathroom 3-5 times nightly   5.  Asthma: No acute exacerbation: Currently on Wixela, recently started on immunotherapy from ENT, does report some breakthrough dyspnea. PFTs today with mixed moderate restriction, and mild non-reversible  obstructions   6.  Seasonal Allergies, not an issue at this time; stopped previous nasal steroids due to sores in nares   7.  Obesity: Body mass index is 34.46 kg/m??.   8.  H/O COVID-19: December 2020   9.  Ex-Smoker: Quit in [place]:   ??  Continue Singulair 10mg  daily   ??  Continue Albuterol MDI: PRN- monitor and track use   ??  Trial of Trelegy 200/62.5 daily- sample given today in clinic.   ??  Patient asked to call our clinic if she is unable to get Trelegy or has any difficulties with this medication   ??  Trigger avoidance   ??  CXR-PA/LAT- ordered at last visit; still pending. Patient asked to obtain   ??  Continue current Auto-BIPAP: IPAP max 25 cwp, EPAP: min: 7 cwp PS:5 for now. We may need to increase EPAP but want to give the patient some more time to acclimate to therapy and to work on her sleep hygiene   ??  Trial of melatonin 5mg   QHS   COVID-19 precautions discussed to include: wearing mask in public, handwashing, social distancing and  sleeping in a separate bedroom when using PAP therapy.    ??  Proper PAP hygiene and compliance requirements reviewed and discussed   ??  Healthy weight and sleep habits encouraged   ??  Continue with smoking cessation   ??  Driver safety reviewed   ??  Follow up with Primary Care Provider (PCP) as directed and for routine health care maintenance.   ??  Patient is encouraged to contact our clinic if patient is experiencing in difficulties with using PAP device.   ??  Follow up in our clinic in 3 months; sooner if needed.    ??  Further recommendations pending clinical course.              History of Present Illness: MS Micheala A Rufer is a  69 y.o. female patient who presents for  follow up of Complex Sleep Apnea (CSA) and asthma.       At last visit, the patient was pending starting autoBIPAP. Her ENT has requested re-evaluation for her asthma and report of audible wheezing prior to starting the patient on immunotherapy. Combination LABA-ICS had been ordered in the past but the patient  was not able to afford it. We started the patient of generic Advair (Wixela) at last visit.      Patient reports that she was diagnosed with asthma in her 80's. She has never been admitted to the hospital for her asthma      Today the following is noted and/or reported      ??  The patient reports that she was diagnosed with COVID -19 in December 2020. She did not require hospitalization. She lost her sense of taste  which is now returning but not fully resolved   ??  She has returned to work   Asthma:   ??  She reports that she has been able to obtain [facility] and has been using BID    ??  ACT today is 16   ??  No recent ED or Urgent Care visits   ??  The patient denies audible wheezing and reports feeling fine. She reports she was surprised when she went to ENT was told that she was wheezing   ??  She has GERD that is controlled with  Prilosec   ??  Pets: 1 dog   ??  Triggers   ??  Heat   ??  A/C in cars   ??  Fall and Spring are her symptomatic months- especially fall- She was hoping that immunotherapy would make her less symptomatic during these times   ??  Inhalers and other asthma therapy:   ??  Wixela   ??  Albuterol MDI PRN   ??  Singulair 10mg  daily         OSA/PAP Therapy   ??  She has been using her device.   ??  She reports ongoing clinical benefit.   ??  No aerophagia or bloating.   ??  No mask/skin difficulties but she does note some occassional leaks.   ??  She reports she has been cleaning her device, mask and hose.      Positive Airway Pressure (PAP) Compliance  Report  & Summary Trends   DME:                Date  >4 hr use % T ime   (Total Time%)  AHI  PAP Rx  Vt 95%   (mls)  Median Use Time  Leak-Median   (95%)  Notes              11/22/19-02/19/20  64 (91)  9.8  VPAP 25/7 PS:5  474  04:52:00  12.4 (40.9)                                                                                     Occupation:    Building control surveyor                      Work Schedule:  62-1600   Shift work: No         Family Sleep History:   - Nephew:  Reportedly died from sleep Apnea          Asthma Control Test 9Yrs Older  02/20/2020  07/17/2019         In the past 4 weeks, how much of the time did your asthma keep you from getting as much done at work, school, or at home?  4  3     During the past 4 weeks how often have you had shortness of breath  2  3     During the past 4 weeks often did your asthma symptoms wake up you at night or earlier than usual in the morning  4  2     During the past 4 weeks how often have you used your rescue inhaler or nebulizer medication   3  4     How would you rate your asthma control during the past 4 weeks  3  4         Score  16  16              Stop Bang  06/26/2019  02/20/2019         Does the patient snore loudly (louder than talking or loud enough to be heard through closed doors)?  1  1     Does the patient often feel tired,  fatigued, or sleepy during the daytime, even after a "good" night's sleep?  1  1     Has anyone ever observed the patient stop breathing during their sleep?   1  0     Does the patient have or are they being treated for high blood pressure?  1  1     Is the patient's BMI greater than 35?  0  0     Is your neck circumference greater than 17 inches (Female) or 16 inches (Female)?  0  0     Is the patient older than 17?  1  1     Is the patient female?  0  0     OSA Score  5  4     Has the patient been referred to Sleep Medicine?  1  -     Has the patient previously been diagnosed with Obstructive Sleep Apnea?  1  -         Treated or Untreated?  Untreated  -              3 most recent PHQ Screens  04/12/2019        Little interest or pleasure in doing things  Not at all     Feeling down, depressed, irritable, or hopeless  Not at all        Total Score PHQ 2  0                Epworth Scale  02/20/2020  04/12/2019  02/20/2019          Sitting and Reading  1  3  3      Watching TV  0  3  3     Sitting, inactive in a public place (e.g. a movie theater or meeting)  0  3  3     As a passenger in a car for an hour, without a break  1  3  3      Lying down to rest in  the afternoon, when circumstances permit  2  3  3      Sitting and talking to someone  0  2  1     Sitting quietly after lunch without alcohol  2  3  2      In a car, while stopped for a few minutes in traffic  0  2  1          Epworth Sleepiness Score  6  22  19                Immunization History        Administered  Date(s) Administered         ?  Influenza High Dose Vaccine PF  10/28/2016     ?  Influenza Vaccine [employer]) PF (>6 Mo Flulaval, Fluarix, and >3 [place] [id number])  10/24/2014, 09/17/2015     ?  Influenza Vaccine (Tri) Adjuvanted (>65 Yrs FLUAD TRI [id number])  10/14/2018     ?  Influenza Vaccine PF  10/02/2013     ?  Influenza Vaccine Split  01/06/2010, 08/21/2010, 10/20/2011     ?  Influenza, Quadrivalent, Adjuvanted (>65 Yrs FLUAD QUAD [id number])  09/06/2019      ?  Pneumococcal Conjugate (PCV-13)  02/19/2016     ?  Pneumococcal Polysaccharide (PPSV-23)  10/14/2018     ?  TB Skin Test (PPD) Intradermal  03/03/2016, 03/21/2018     ?  Tdap  10/17/2013         ?  Zoster Vaccine, Live  07/02/2015              Past Medical History:     Past Medical History:        Diagnosis  Date         ?  Anemia       ?  Asthma       ?  Colon polyp       ?  DDD (degenerative disc disease)  02/15/2009     ?  Depression  12/18/2011     ?  Essential hypertension, benign       ?  History of echocardiogram  03/20/2004          EF 70%.  Borderline DDfx.  No significant valvular pathology.         ?  Hypercholesterolemia       ?  Hypertension       ?  Iron deficiency anemia  02/15/2009     ?  Lichen planus  99991111     ?  Lower extremity venous duplex  10/23/2009          Left leg:  No DVT.           ?  Meralgia paraesthetica  10/22/2013     ?  OA (osteoarthritis of spine)  02/15/2009     ?  OA (osteoarthritis of spine)  02/15/2009     ?  Obesity, unspecified       ?  Other and unspecified hyperlipidemia       ?  Pre-operative cardiovascular examination            For spine surgery         ?  Right sided sciatica       ?  Shortness of breath            Possible asthma, HCVD; less likely CAD (Noted 03/15/09)         ?  Sleep apnea  02/15/2009          uses cpap machine         ?  Thallium stress test   03/20/2004          Partially transient, mod basal & mid anterior defect most c/w artifact; mild anterior ischemia less likely.  Neg EKG on pharm stress test.         ?  Thyroid disease            hypothyroidism           Past Surgical History:     Past Surgical History:         Procedure  Laterality  Date          ?  HX COLONOSCOPY    02-24-12          normal (Dr. Angelique Holm)          ?  HX HERNIA REPAIR              3x          ?  HX HYSTERECTOMY         ?  HX TUBAL LIGATION         ?  NEUROLOGICAL PROCEDURE UNLISTED    03-16-2009          s/p ACD & Fusion (Dr.P.Gurtner)          ?  PR COLONOSCOPY FLX DX W/COLLJ  SPEC WHEN PFRMD    02-2007          +polyp(tubular adenoma); Dr Angelique Holm           Family History:     Family History         Problem  Relation  Age of Onset          ?  Cancer  Mother       ?  Hypertension  Mother       ?  Hypertension  Sister                x3          ?  Heart Surgery  Sister       ?  Heart Disease  Sister       ?  Heart Attack  Sister       ?  Cancer  Maternal Aunt                breast          ?  Breast Cancer  Maternal Aunt       ?  Glaucoma  Son       ?  Heart defect  Son       ?  Diabetes  Paternal Uncle            ?  Hypertension  Paternal Uncle             Social History:     Social History          Tobacco Use         ?  Smoking status:  Former Smoker              Packs/day:  1.00         Years:  28.00         Pack years:  28.00         Start date:  10/16/1977         Quit date:  04/25/2004         Years since quitting:  15.8         ?  Smokeless tobacco:  Never Used       Substance Use Topics         ?  Alcohol use:  No         ?  Drug use:  No                 Caffeine  Amount  Time of last Intake  Comments          Coffee  Several/day  evening       Soda  2/day    Diet Pepsi     Tea  Rare              Energy Drinks  None              Over- the - counter stimulant pills  None              Other Substances                Alcohol  None         Tobacco  None         Drugs  None              Other:  None               Medications:     Current Outpatient Medications on File Prior to Visit          Medication  Sig  Dispense  Refill           ?  terbinafine HCL (LAMISIL) 250 mg tablet  TAKE 1 TABLET EVERY DAY  90 Tab  0     ?  montelukast (SINGULAIR) 10 mg tablet  take 1 tablet by mouth at bedtime if needed  90 Tab  1     ?  carvediloL (COREG) 6.25 mg tablet  Take 1 Tab by mouth two (2) times daily (with meals).  180 Tab  3     ?  gabapentin (NEURONTIN) 300 mg capsule  take 1 capsule by mouth every morning and 2 at bedtime  180 Cap  1     ?  atorvastatin (LIPITOR) 40 mg tablet  Take 1 Tab by mouth  daily.  90 Tab  1           ?  levothyroxine (SYNTHROID) 150 mcg tablet  Take 1 Tab by mouth Daily (before breakfast).  90 Tab  1           ?  valsartan-hydroCHLOROthiazide (DIOVAN-HCT) 160-25 mg per tablet  Take 1 Tab by mouth daily.  90 Tab  3     ?  omeprazole (PRILOSEC) 20 mg capsule  Take 1 Cap by mouth two (2) times a day.  180 Cap  0     ?  albuterol (PROVENTIL HFA, VENTOLIN HFA, PROAIR HFA) 90 mcg/actuation inhaler  Take 2 Puffs by inhalation every four (4) hours as needed for Wheezing.  1 Inhaler  0     ?  OTHER  1 Tab daily. Align Probiotic         ?  potassium chloride (KLOR-CON) 10 mEq tablet  take 1 tablet by mouth once daily  90 Tab  3     ?  latanoprost (XALATAN) 0.005 % ophthalmic solution  Administer 1 Drop to both eyes nightly.         ?  multivitamin (ONE A DAY) tablet  Take 1 Tab by mouth daily.         ?  Cholecalciferol, Vitamin D3, (VITAMIN D-3) 5,000 unit Tab  Take  by mouth daily.         ?  omeprazole (PRILOSEC) 20 mg capsule  Take 1 Cap by mouth daily.         ?  vit B complx C/folic acid/zinc (VIT B COMPLEX-C-FOLIC AC-ZINC PO)  Take 1 Tab by mouth daily.               ?  OTHER  Latanoprost .005% one drop each eye daily              No current facility-administered medications on file prior to visit.             Allergy:     Allergies        Allergen  Reactions         ?  Aspirin  Other (comments)             GI distress,          ?  Asa-Acetaminophen-Caff-Potass  Other (comments)             Bleeding in stomach         ?  Benadryl [Diphenhydramine Hcl]  Other (comments)             Muscle jerking         ?  Other Plant, Higher education careers adviser, Environmental  Not Reported This Time             Grass, Dust and Mold         ?  Pollen Extracts  Not Reported This Time           Review of Systems   General ROS: negative for - chills, fever, hot flashes, malaise or night sweats   ENT ROS: negative for - epistaxis, headaches, hearing change, nasal discharge, nasal polyps, oral lesions, sinus pain, sneezing, sore  throat, tinnitus, vertigo or visual changes   Hematological and Lymphatic ROS: negative for - bleeding problems, blood clots, bruising, jaundice, pallor or swollen lymph nodes   Endocrine ROS: negative for - polydipsia/polyuria, skin changes, temperature intolerance or unexpected weight changes   Respiratory ROS: no cough, shortness of breath, + wheezing- episodic- none today   Cardiovascular ROS: no chest pain or dyspnea on exertion   Gastrointestinal ROS: no abdominal pain, change in bowel habits, or black or bloody stools   Genito-Urinary ROS: no dysuria, trouble voiding, or hematuria   Musculoskeletal ROS: negative   Neurological ROS: no TIA or stroke symptoms   Dermatological ROS: negative for - pruritus, rash or skin lesion changes    Psychological ROS: asa bove    Otherwise negative.         Physical Exam:   Blood pressure (!) 140/70, pulse 75, temperature 98.2 ??F (36.8 ??C), temperature  source Oral, resp. rate 20, height 5\' 2"  (1.575 m), weight 90.3 kg (199 lb), SpO2 98 %.on RA, Body mass index is 36.4 kg/m??.        General: No distress, acyanotic, appears stated age, cooperative, pleasant   HEENT: PERRL, EOMI, throat without erythema or exudate, Tongue- large - without dental  indention on tongue, upper dentures in place, Mallampati's score 3+, Uvula- midline, Tonsils- not seen,    Neck: Supple,  no abnormally enlarged lymph nodes, thyroid is not enlarged, non-tender, No JVD No Carotid bruit  Chest: Grossly normal.   Lungs: Moderate air entry, clear to auscultation bilaterally- Good effort- no wheezing  or upper airway sounds   Heart: Regular rate and rhythm, S1S2 present, without murmur.   Abdomen: Protuberant,abdomen is soft without significant tenderness, or guarding.   Extremity: Negative for cyanosis, edema, or clubbing.   Skin: Skin color, texture, turgor normal. No rashes or lesions.   Neurological: CN 2-12 grossly intact, normal muscle tone.      Data Reviewed:   CBC:      Lab Results          Component  Value  Date/Time            WBC  6.2  01/10/2018 04:34 PM       HGB  10.3 (L)  01/10/2018 04:34 PM       HCT  32.5 (L)  01/10/2018 04:34 PM       PLATELET  318  01/10/2018 04:34 PM            MCV  79.9  01/10/2018 04:34 PM        Results for WYNNETTE, DELUCIA (MRN [id number]) as of 07/17/2019 11:33             Ref. Range  04/27/2015 00:00  02/17/2016 00:00  07/03/2016 01:20  10/24/2016 07:00  01/10/2018 16:34             ABS. EOSINOPHILS  Latest Ref Range: 0.0 - 0.4 K/UL  0.2  0.2    0.1  0.2        Results for CHARMAN, SPROULS (MRN [id number]) as of 02/24/2020 00:21             Ref. Range  07/09/2014 12:00  04/27/2015 00:00  02/17/2016 00:00  10/24/2016 07:00  01/10/2018 16:34     EOSINOPHILS  Latest Ref Range: 0 - 5 %  3  3  2  2  2      ABS. EOSINOPHILS  Latest Ref Range: 0.0 - 0.4 K/UL  0.1  0.2  0.2  0.1  0.2        BMP:      Lab Results         Component  Value  Date/Time            Sodium  142  09/01/2019 03:12 AM       Potassium  3.7  09/01/2019 03:12 AM       Chloride  100  09/01/2019 03:12 AM       CO2  27  09/01/2019 03:12 AM       Anion gap  3  10/10/2018 03:55 PM       Glucose  117 (H)  09/01/2019 03:12 AM       BUN  8  09/01/2019 03:12 AM       Creatinine  0.64  09/01/2019 03:12 AM       BUN/Creatinine ratio  13  09/01/2019 03:12 AM       GFR est AA  105  09/01/2019 03:12 AM       GFR est non-AA  91  09/01/2019 03:12 AM            Calcium  9.7  09/01/2019 03:12 AM              Lab Results         Component  Value  Date/Time            TSH  0.162 (L)  09/01/2019 03:12 AM            T4, Free  1.3  03/29/2017 05:30 PM          Lab Results         Component  Value  Date/Time            Hemoglobin A1c  5.7 (H)  09/01/2019 03:12 AM           Imaging:   [x] I have  personally reviewed the patients radiographs section      Results from Hospital Encounter encounter on 02/13/17     XR CHEST PA LAT           Narrative  Clinical history: Cough, chest pain      EXAMINATION: PA and lateral views of the chest 02/13/2017       Correlation: 07/02/2016      FINDINGS:   Anterior cervical. There is cardiomegaly. Trachea is midline. Lungs are clear.   Degenerative changes of the spine.              Impression  IMPRESSION:   Cardiomegaly.                Results from [facility] encounter on 01/10/18     CT HEAD WO CONT           Narrative  EXAMINATION: CT head without contrast      INDICATION: Dizziness, fatigue      COMPARISON: None      TECHNIQUE: CT of the head performed without contrast, with multiplanar   reformations. All CT scans at this facility are performed using dose   optimization technique as appropriate to a performed exam, to include automated   exposure control, adjustment of the mA and/or kV according to patient size   (including appropriate matching first site specific examinations), or use of   iterative reconstruction technique.      FINDINGS: No focal mass effect or shift of midline structures. No abnormal   extra-axial collection identified. Ventricles are normal in size and   configuration. No evidence of acute intracranial hemorrhage. No suspicious   parenchymal lesions. Calvarium intact. Imaged paranasal sinuses and mastoids are   well-aerated. Superficial soft tissues unremarkable.              Impression  IMPRESSION:      No acute intracranial findings. Of note, MRI is more sensitive in the detection   of acute infarct.            Historical Sleep Testing Data:   ??  HST: 04/07/19: AHI: 25.7, CAI: 2.1, ODI: 18.7, SpO2 nadir: 58%, SpO2 Avg: 90%- Moderate  Complex Sleep Apnea             Cecil Cranker, DO, FCCP   Pulmonary, Sleep and Critical Care Medicine

## 2020-02-20 NOTE — Patient Instructions (Addendum)
Please call our clinic back at [phone] if you have not received a follow up appointment within 30 days prior the recommended follow up time.

## 2020-02-20 NOTE — Progress Notes (Signed)
[facility]  Pulmonary, Critical Care, and Sleep Medicine    Office Progress Note       Primary Care Physician: Thana Farr, MD     Reason for Visit:  Evaluation for Wheezing    Assessment & Plan:      Assessment:  1. ComplexSleep Apnea (OSA): Severe: (HST: 04/07/19)- Overall AHI: 31.2 and CAI: 12.1-Reports clinical benefit from   2. Excessive Daytime Sleepiness (EDS)/ Hypersomnia- persistent- more than likely related to untreated CSA  3. H/O OSA- no recent PAP therapy.   4. Nocturia- gets up to use the bathroom 3-5 times nightly  5. Asthma: No acute exacerbation: Currently on Wixela, recently started on immunotherapy from ENT, does report some breakthrough dyspnea. PFTs today with mixed moderate restriction, and mild non-reversible obstructions  6. Seasonal Allergies, not an issue at this time; stopped previous nasal steroids due to sores in nares  7. Obesity: Body mass index is 34.46 kg/m??.  8. H/O COVID-19: December 2020  9. Ex-Smoker: Quit in [place]:  ??? Continue Singulair 10mg  daily  ??? Continue Albuterol MDI: PRN- monitor and track use  ??? Trial of Trelegy 200/62.5 daily- sample given today in clinic.  ??? Patient asked to call our clinic if she is unable to get Trelegy or has any difficulties with this medication  ??? Trigger avoidance  ??? CXR-PA/LAT- ordered at last visit; still pending. Patient asked to obtain  ??? Continue current Auto-BIPAP: IPAP max 25 cwp, EPAP: min: 7 cwp PS:5 for now. We may need to increase EPAP but want to give the patient some more time to acclimate to therapy and to work on her sleep hygiene  ??? Trial of melatonin 5mg  QHS  COVID-19 precautions discussed to include: wearing mask in public, handwashing, social distancing and sleeping in a separate bedroom when using PAP therapy.   ??? Proper PAP hygiene and compliance requirements reviewed and discussed  ??? Healthy weight and sleep habits encouraged  ??? Continue with smoking cessation  ??? Driver safety reviewed  ???  Follow up with Primary Care Provider (PCP) as directed and for routine health care maintenance.  ??? Patient is encouraged to contact our clinic if patient is experiencing in difficulties with using PAP device.  ??? Follow up in our clinic in 3 months; sooner if needed.   ??? Further recommendations pending clinical course.         History of Present Illness: MS Trinidi A Mccrobie is a 69 y.o. female patient who presents for follow up of Complex Sleep Apnea (CSA) and asthma.     At last visit, the patient was pending starting autoBIPAP. Her ENT has requested re-evaluation for her asthma and report of audible wheezing prior to starting the patient on immunotherapy. Combination LABA-ICS had been ordered in the past but the patient was not able to afford it. We started the patient of generic Advair (Wixela) at last visit.    Patient reports that she was diagnosed with asthma in her 36's. She has never been admitted to the hospital for her asthma    Today the following is noted and/or reported    ?? The patient reports that she was diagnosed with COVID -19 in December 2020. She did not require hospitalization. She lost her sense of taste which is now returning but not fully resolved  ?? She has returned to work  Asthma:  ?? She reports that she has been able to obtain [facility] and has been using  BID   ?? ACT today is 16  ?? No recent ED or Urgent Care visits  ?? The patient denies audible wheezing and reports feeling fine. She reports she was surprised when she went to ENT was told that she was wheezing  ?? She has GERD that is controlled with Prilosec  ?? Pets: 1 dog  ?? Triggers  ?? Heat  ?? A/C in cars  ?? Fall and Spring are her symptomatic months- especially fall- She was hoping that immunotherapy would make her less symptomatic during these times  ?? Inhalers and other asthma therapy:  ?? Wixela  ?? Albuterol MDI PRN  ?? Singulair 10mg  daily      OSA/PAP Therapy  ?? She has been using her device.  ?? She reports ongoing clinical benefit.  ??  No aerophagia or bloating.  ?? No mask/skin difficulties but she does note some occassional leaks.  ?? She reports she has been cleaning her device, mask and hose.    Positive Airway Pressure (PAP) Compliance  Report & Summary Trends  DME:     Date >4 hr use % Time  (Total Time%) AHI PAP Rx Vt 95%  (mls) Median Use Time Leak-Median  (95%) Notes   11/22/19-02/19/20 64 (91) 9.8 VPAP 25/7 PS:5 474 04:52:00 12.4 (40.9)                                          Occupation:    Building control surveyor                     Work Schedule: 0730-1600  Shift work: No      Family Sleep History:  - Nephew:  Reportedly died from sleep Apnea    Asthma Control Test 14Yrs Older 02/20/2020 07/17/2019   In the past 4 weeks, how much of the time did your asthma keep you from getting as much done at work, school, or at home? 4 3   During the past 4 weeks how often have you had shortness of breath 2 3   During the past 4 weeks often did your asthma symptoms wake up you at night or earlier than usual in the morning 4 2   During the past 4 weeks how often have you used your rescue inhaler or nebulizer medication  3 4   How would you rate your asthma control during the past 4 weeks 3 4   Score 16 16       Stop Bang 06/26/2019 02/20/2019   Does the patient snore loudly (louder than talking or loud enough to be heard through closed doors)? 1 1   Does the patient often feel tired, fatigued, or sleepy during the daytime, even after a "good" night's sleep? 1 1   Has anyone ever observed the patient stop breathing during their sleep?  1 0   Does the patient have or are they being treated for high blood pressure? 1 1   Is the patient's BMI greater than 35? 0 0   Is your neck circumference greater than 17 inches (Female) or 16 inches (Female)? 0 0   Is the patient older than 31? 1 1   Is the patient female? 0 0   OSA Score 5 4   Has the patient been referred to Sleep Medicine? 1 -   Has the patient previously been diagnosed with Obstructive Sleep  Apnea? 1 -   Treated  or Untreated? Untreated -       3 most recent PHQ Screens 04/12/2019   Little interest or pleasure in doing things Not at all   Feeling down, depressed, irritable, or hopeless Not at all   Total Score PHQ 2 0       Epworth Scale 02/20/2020 04/12/2019 02/20/2019   Sitting and Reading 1 3 3    Watching TV 0 3 3   Sitting, inactive in a public place (e.g. a movie theater or meeting) 0 3 3   As a passenger in a car for an hour, without a break 1 3 3    Lying down to rest in the afternoon, when circumstances permit 2 3 3    Sitting and talking to someone 0 2 1   Sitting quietly after lunch without alcohol 2 3 2    In a car, while stopped for a few minutes in traffic 0 2 1   Epworth Sleepiness Score 6 22 19          Immunization History   Administered Date(s) Administered   ??? Influenza High Dose Vaccine PF 10/28/2016   ??? Influenza Vaccine [employer]) PF (>6 Mo Flulaval, Fluarix, and >3 Yrs Afluria, Fluzone [id number]) 10/24/2014, 09/17/2015   ??? Influenza Vaccine (Tri) Adjuvanted (>65 Yrs FLUAD TRI [id number]) 10/14/2018   ??? Influenza Vaccine PF 10/02/2013   ??? Influenza Vaccine Split 01/06/2010, 08/21/2010, 10/20/2011   ??? Influenza, Quadrivalent, Adjuvanted (>65 Yrs FLUAD QUAD [id number]) 09/06/2019   ??? Pneumococcal Conjugate (PCV-13) 02/19/2016   ??? Pneumococcal Polysaccharide (PPSV-23) 10/14/2018   ??? TB Skin Test (PPD) Intradermal 03/03/2016, 03/21/2018   ??? Tdap 10/17/2013   ??? Zoster Vaccine, Live 07/02/2015         Past Medical History:  Past Medical History:   Diagnosis Date   ??? Anemia    ??? Asthma    ??? Colon polyp    ??? DDD (degenerative disc disease) 02/15/2009   ??? Depression 12/18/2011   ??? Essential hypertension, benign    ??? History of echocardiogram 03/20/2004    EF 70%.  Borderline DDfx.  No significant valvular pathology.   ??? Hypercholesterolemia    ??? Hypertension    ??? Iron deficiency anemia 99991111   ??? 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 Lower extremity venous duplex 10/23/2009    Left leg:  No DVT.     ??? Meralgia paraesthetica 10/22/2013   ??? OA  (osteoarthritis of spine) 02/15/2009   ??? OA (osteoarthritis of spine) 02/15/2009   ??? Obesity, unspecified    ??? Other and unspecified hyperlipidemia    ??? Pre-operative cardiovascular examination     For spine surgery   ??? Right sided sciatica    ??? Shortness of breath     Possible asthma, HCVD; less likely CAD (Noted 03/15/09)   ??? Sleep apnea 02/15/2009    uses cpap machine   ??? Thallium stress test  03/20/2004    Partially transient, mod basal & mid anterior defect most c/w artifact; mild anterior ischemia less likely.  Neg EKG on pharm stress test.   ??? Thyroid disease     hypothyroidism       Past Surgical History:  Past Surgical History:   Procedure Laterality Date   ??? HX COLONOSCOPY  02-24-12    normal (Dr. Angelique Holm)   ??? HX HERNIA REPAIR      3x   ??? HX HYSTERECTOMY     ??? HX TUBAL LIGATION     ???  NEUROLOGICAL PROCEDURE UNLISTED  03-16-2009    s/p ACD & Fusion (Dr.P.Gurtner)   ??? PR COLONOSCOPY FLX DX W/COLLJ SPEC WHEN PFRMD  02-2007    +polyp(tubular adenoma); Dr Angelique Holm       Family History:  Family History   Problem Relation Age of Onset   ??? Cancer Mother    ??? Hypertension Mother    ??? Hypertension Sister         x3   ??? Heart Surgery Sister    ??? Heart Disease Sister    ??? Heart Attack Sister    ??? Cancer Maternal Aunt         breast   ??? Breast Cancer Maternal Aunt    ??? Glaucoma Son    ??? Heart defect Son    ??? Diabetes Paternal Uncle    ??? Hypertension Paternal Uncle        Social History:  Social History     Tobacco Use   ??? Smoking status: Former Smoker     Packs/day: 1.00     Years: 28.00     Pack years: 28.00     Start date: 10/16/1977     Quit date: 04/25/2004     Years since quitting: 15.8   ??? Smokeless tobacco: Never Used   Substance Use Topics   ??? Alcohol use: No   ??? Drug use: No        Caffeine Amount Time of last Intake Comments   Coffee Several/day evening    Soda 2/day  Diet Pepsi   Tea Rare     Energy Drinks None     Over- the - counter stimulant pills None     Other Substances      Alcohol None     Tobacco None     Drugs  None     Other: None         Medications:  Current Outpatient Medications on File Prior to Visit   Medication Sig Dispense Refill   ??? terbinafine HCL (LAMISIL) 250 mg tablet TAKE 1 TABLET EVERY DAY 90 Tab 0   ??? montelukast (SINGULAIR) 10 mg tablet take 1 tablet by mouth at bedtime if needed 90 Tab 1   ??? carvediloL (COREG) 6.25 mg tablet Take 1 Tab by mouth two (2) times daily (with meals). 180 Tab 3   ??? gabapentin (NEURONTIN) 300 mg capsule take 1 capsule by mouth every morning and 2 at bedtime 180 Cap 1   ??? atorvastatin (LIPITOR) 40 mg tablet Take 1 Tab by mouth daily. 90 Tab 1   ??? levothyroxine (SYNTHROID) 150 mcg tablet Take 1 Tab by mouth Daily (before breakfast). 90 Tab 1   ??? valsartan-hydroCHLOROthiazide (DIOVAN-HCT) 160-25 mg per tablet Take 1 Tab by mouth daily. 90 Tab 3   ??? omeprazole (PRILOSEC) 20 mg capsule Take 1 Cap by mouth two (2) times a day. 180 Cap 0   ??? albuterol (PROVENTIL HFA, VENTOLIN HFA, PROAIR HFA) 90 mcg/actuation inhaler Take 2 Puffs by inhalation every four (4) hours as needed for Wheezing. 1 Inhaler 0   ??? OTHER 1 Tab daily. Align Probiotic     ??? potassium chloride (KLOR-CON) 10 mEq tablet take 1 tablet by mouth once daily 90 Tab 3   ??? latanoprost (XALATAN) 0.005 % ophthalmic solution Administer 1 Drop to both eyes nightly.     ??? multivitamin (ONE A DAY) tablet Take 1 Tab by mouth daily.     ??? Cholecalciferol, Vitamin D3, (VITAMIN D-3) 5,000  unit Tab Take  by mouth daily.     ??? omeprazole (PRILOSEC) 20 mg capsule Take 1 Cap by mouth daily.     ??? vit B complx C/folic acid/zinc (VIT B COMPLEX-C-FOLIC AC-ZINC PO) Take 1 Tab by mouth daily.     ??? OTHER Latanoprost .005% one drop each eye daily       No current facility-administered medications on file prior to visit.         Allergy:  Allergies   Allergen Reactions   ??? Aspirin Other (comments)     GI distress,    ??? Asa-Acetaminophen-Caff-Potass Other (comments)     Bleeding in stomach   ??? Benadryl [Diphenhydramine Hcl] Other (comments)      Muscle jerking   ??? Other Plant, Animal, Environmental Not Reported This Time     Grass, Dust and Mold   ??? Pollen Extracts Not Reported This Time       Review of Systems  General ROS: negative for - chills, fever, hot flashes, malaise or night sweats  ENT ROS: negative for - epistaxis, headaches, hearing change, nasal discharge, nasal polyps, oral lesions, sinus pain, sneezing, sore throat, tinnitus, vertigo or visual changes  Hematological and Lymphatic ROS: negative for - bleeding problems, blood clots, bruising, jaundice, pallor or swollen lymph nodes  Endocrine ROS: negative for - polydipsia/polyuria, skin changes, temperature intolerance or unexpected weight changes  Respiratory ROS: no cough, shortness of breath, + wheezing- episodic- none today  Cardiovascular ROS: no chest pain or dyspnea on exertion  Gastrointestinal ROS: no abdominal pain, change in bowel habits, or black or bloody stools  Genito-Urinary ROS: no dysuria, trouble voiding, or hematuria  Musculoskeletal ROS: negative  Neurological ROS: no TIA or stroke symptoms  Dermatological ROS: negative for - pruritus, rash or skin lesion changes   Psychological ROS: asa bove   Otherwise negative.      Physical Exam:  Blood pressure (!) 140/70, pulse 75, temperature 98.2 ??F (36.8 ??C), temperature source Oral, resp. rate 20, height 5\' 2"  (1.575 m), weight 90.3 kg (199 lb), SpO2 98 %.on RA, Body mass index is 36.4 kg/m??.     General: No distress, acyanotic, appears stated age, cooperative, pleasant  HEENT: PERRL, EOMI, throat without erythema or exudate, Tongue- large - without dental indention on tongue, upper dentures in place, Mallampati's score 3+, Uvula- midline, Tonsils- not seen,   Neck: Supple,  no abnormally enlarged lymph nodes, thyroid is not enlarged, non-tender, No JVD No Carotid bruit  Chest: Grossly normal.  Lungs: Moderate air entry, clear to auscultation bilaterally- Good effort- no wheezing or upper airway sounds  Heart: Regular rate and  rhythm, S1S2 present, without murmur.  Abdomen: Protuberant,abdomen is soft without significant tenderness, or guarding.  Extremity: Negative for cyanosis, edema, or clubbing.  Skin: Skin color, texture, turgor normal. No rashes or lesions.  Neurological: CN 2-12 grossly intact, normal muscle tone.    Data Reviewed:  CBC:   Lab Results   Component Value Date/Time    WBC 6.2 01/10/2018 04:34 PM    HGB 10.3 (L) 01/10/2018 04:34 PM    HCT 32.5 (L) 01/10/2018 04:34 PM    PLATELET 318 01/10/2018 04:34 PM    MCV 79.9 01/10/2018 04:34 PM     Results for BLAINE, PASQUARELLI (MRN [id number]) as of 07/17/2019 11:33   Ref. Range 04/27/2015 00:00 02/17/2016 00:00 07/03/2016 01:20 10/24/2016 07:00 01/10/2018 16:34   ABS. EOSINOPHILS Latest Ref Range: 0.0 - 0.4 K/UL 0.2 0.2  0.1 0.2  Results for PRESIOUS, STROW (MRN [id number]) as of 02/24/2020 00:21   Ref. Range 07/09/2014 12:00 04/27/2015 00:00 02/17/2016 00:00 10/24/2016 07:00 01/10/2018 16:34   EOSINOPHILS Latest Ref Range: 0 - 5 % 3 3 2 2 2    ABS. EOSINOPHILS Latest Ref Range: 0.0 - 0.4 K/UL 0.1 0.2 0.2 0.1 0.2     BMP:   Lab Results   Component Value Date/Time    Sodium 142 09/01/2019 03:12 AM    Potassium 3.7 09/01/2019 03:12 AM    Chloride 100 09/01/2019 03:12 AM    CO2 27 09/01/2019 03:12 AM    Anion gap 3 10/10/2018 03:55 PM    Glucose 117 (H) 09/01/2019 03:12 AM    BUN 8 09/01/2019 03:12 AM    Creatinine 0.64 09/01/2019 03:12 AM    BUN/Creatinine ratio 13 09/01/2019 03:12 AM    GFR est AA 105 09/01/2019 03:12 AM    GFR est non-AA 91 09/01/2019 03:12 AM    Calcium 9.7 09/01/2019 03:12 AM        Lab Results   Component Value Date/Time    TSH 0.162 (L) 09/01/2019 03:12 AM    T4, Free 1.3 03/29/2017 05:30 PM     Lab Results   Component Value Date/Time    Hemoglobin A1c 5.7 (H) 09/01/2019 03:12 AM       Imaging:  [x] I have personally reviewed the patient???s radiographs section   Results from Hospital Encounter encounter on 02/13/17   XR CHEST PA LAT    Narrative Clinical history: Cough,  chest pain    EXAMINATION: PA and lateral views of the chest 02/13/2017    Correlation: 07/02/2016    FINDINGS:  Anterior cervical. There is cardiomegaly. Trachea is midline. Lungs are clear.  Degenerative changes of the spine.      Impression IMPRESSION:  Cardiomegaly.         Results from [place] encounter on 01/10/18   CT HEAD WO CONT    Narrative EXAMINATION: CT head without contrast    INDICATION: Dizziness, fatigue    COMPARISON: None    TECHNIQUE: CT of the head performed without contrast, with multiplanar  reformations. All CT scans at this facility are performed using dose  optimization technique as appropriate to a performed exam, to include automated  exposure control, adjustment of the mA and/or kV according to patient size  (including appropriate matching first site specific examinations), or use of  iterative reconstruction technique.    FINDINGS: No focal mass effect or shift of midline structures. No abnormal  extra-axial collection identified. Ventricles are normal in size and  configuration. No evidence of acute intracranial hemorrhage. No suspicious  parenchymal lesions. Calvarium intact. Imaged paranasal sinuses and mastoids are  well-aerated. Superficial soft tissues unremarkable.      Impression IMPRESSION:    No acute intracranial findings. Of note, MRI is more sensitive in the detection  of acute infarct.        Historical Sleep Testing Data:  ?? HST: 04/07/19: AHI: 25.7, CAI: 2.1, ODI: 18.7, SpO2 nadir: 58%, SpO2 Avg: 90%- Moderate Complex Sleep Apnea         Cecil Cranker, DO, FCCP  Pulmonary, Sleep and Critical Care Medicine

## 2020-02-23 MED ORDER — OMEPRAZOLE 20 MG CAP, DELAYED RELEASE
20 mg | ORAL_CAPSULE | ORAL | 1 refills | Status: DC
Start: 2020-02-23 — End: 2020-07-18

## 2020-02-23 NOTE — Telephone Encounter (Signed)
Patient informed the rx was sent to mail order on 02/20/20

## 2020-02-23 NOTE — Telephone Encounter (Signed)
Pt calling back to let office know that her insurance will cover trelegy. Please call [phone].

## 2020-02-25 NOTE — Telephone Encounter (Signed)
I will sign form

## 2020-02-26 ENCOUNTER — Encounter

## 2020-02-26 NOTE — Telephone Encounter (Signed)
[place] [facility] reports the last fill date for Neurontin as 11/20/19 for a 90 d/s.     Last Visit: 11/16/19 with MD Hunte  Next Appointment: 03/05/20 with MD Hunte  Previous Refill Encounter(s): 07/30/19 #180 with 1 refill    Requested Prescriptions     Pending Prescriptions Disp Refills   ??? gabapentin (NEURONTIN) 300 mg capsule 270 Cap 1     Sig: Take 1 Cap by mouth every morning AND 2 Caps nightly. Max Daily Amount: 900 mg.

## 2020-02-26 NOTE — Telephone Encounter (Signed)
Left message for patient DMV form will be ready for pick up this afternoon.

## 2020-02-27 ENCOUNTER — Ambulatory Visit: Admit: 2020-02-27 | Discharge: 2020-02-27 | Payer: MEDICARE | Primary: Internal Medicine

## 2020-02-27 DIAGNOSIS — R7303 Prediabetes: Secondary | ICD-10-CM

## 2020-02-27 MED ORDER — GABAPENTIN 300 MG CAP
300 mg | ORAL_CAPSULE | ORAL | 1 refills | Status: DC
Start: 2020-02-27 — End: 2020-07-18

## 2020-02-28 LAB — COMPREHENSIVE METABOLIC PANEL
ALT: 18 IU/L (ref 0–32)
AST: 16 IU/L (ref 0–40)
Albumin/Globulin Ratio: 1.6 NA (ref 1.2–2.2)
Albumin: 4.4 g/dL (ref 3.8–4.8)
Alkaline Phosphatase: 86 IU/L (ref 39–117)
BUN/Creatinine Ratio: 12 NA (ref 12–28)
BUN: 9 mg/dL (ref 8–27)
CO2: 31 mmol/L — ABNORMAL HIGH (ref 20–29)
Calcium: 9.8 mg/dL (ref 8.7–10.3)
Chloride: 96 mmol/L (ref 96–106)
Creatinine: 0.73 mg/dL (ref 0.57–1.00)
GFR African American: 96 mL/min/{1.73_m2} (ref >59)
Globulin, Total: 2.7 g/dL (ref 1.5–4.5)
Glucose: 94 mg/dL (ref 65–99)
Potassium: 3.9 mmol/L (ref 3.5–5.2)
Sodium: 140 mmol/L (ref 134–144)
Total Bilirubin: 0.5 mg/dL (ref 0.0–1.2)
Total Protein: 7.1 g/dL (ref 6.0–8.5)
eGFR NON-AA: 84 mL/min/{1.73_m2} (ref >59)

## 2020-02-28 LAB — LIPID PANEL
Cholesterol, Total: 160 mg/dL (ref 100–199)
Cholesterol, total: 160 mg/dL (ref 100–199)
HDL Cholesterol: 57 mg/dL (ref >39)
HDL: 57 mg/dL (ref >39)
LDL Calculated: 86 mg/dL (ref 0–99)
LDL, calculated: 86 mg/dL (ref 0–99)
Triglyceride: 90 mg/dL (ref 0–149)
Triglycerides: 90 mg/dL (ref 0–149)
VLDL, calculated: 17 mg/dL (ref 5–40)
VLDL: 17 mg/dL (ref 5–40)

## 2020-02-28 LAB — HEMOGLOBIN A1C W/O EAG
Hemoglobin A1C: 5.4 % (ref 4.8–5.6)
Hemoglobin A1c: 5.4 % (ref 4.8–5.6)

## 2020-02-28 LAB — CVD REPORT

## 2020-02-28 LAB — VITAMIN D 25 HYDROXY: Vit D, 25-Hydroxy: 72.2 ng/mL (ref 30.0–100.0)

## 2020-02-28 LAB — TSH 3RD GENERATION
TSH: 1.16 u[IU]/mL (ref 0.450–4.500)
TSH: 1.16 u[IU]/mL (ref 0.450–4.500)

## 2020-02-28 LAB — VITAMIN D, 25 HYDROXY: VITAMIN D, 25-HYDROXY: 72.2 ng/mL (ref 30.0–100.0)

## 2020-02-28 LAB — METABOLIC PANEL, COMPREHENSIVE
A-G Ratio: 1.6 (ref 1.2–2.2)
ALT (SGPT): 18 IU/L (ref 0–32)
AST (SGOT): 16 IU/L (ref 0–40)
Albumin: 4.4 g/dL (ref 3.8–4.8)
Alk. phosphatase: 86 IU/L (ref 39–117)
BUN/Creatinine ratio: 12 (ref 12–28)
BUN: 9 mg/dL (ref 8–27)
Bilirubin, total: 0.5 mg/dL (ref 0.0–1.2)
CO2: 31 mmol/L — ABNORMAL HIGH (ref 20–29)
Calcium: 9.8 mg/dL (ref 8.7–10.3)
Chloride: 96 mmol/L (ref 96–106)
Creatinine: 0.73 mg/dL (ref 0.57–1.00)
GFR est AA: 96 mL/min/{1.73_m2} (ref >59)
GFR est non-AA: 84 mL/min/{1.73_m2} (ref >59)
GLOBULIN, TOTAL: 2.7 g/dL (ref 1.5–4.5)
Glucose: 94 mg/dL (ref 65–99)
Potassium: 3.9 mmol/L (ref 3.5–5.2)
Protein, total: 7.1 g/dL (ref 6.0–8.5)
Sodium: 140 mmol/L (ref 134–144)

## 2020-03-01 ENCOUNTER — Ambulatory Visit: Payer: Medicare Other

## 2020-03-05 ENCOUNTER — Ambulatory Visit: Admit: 2020-03-05 | Discharge: 2020-03-05 | Payer: MEDICARE | Attending: Internal Medicine | Primary: Internal Medicine

## 2020-03-05 ENCOUNTER — Ambulatory Visit: Attending: Internal Medicine | Primary: Internal Medicine

## 2020-03-05 DIAGNOSIS — Z0000 Encounter for general adult medical examination without abnormal findings: Secondary | ICD-10-CM

## 2020-03-05 NOTE — Progress Notes (Signed)
This is the Subsequent Medicare Annual Wellness Exam, performed 12 months or more after the Initial AWV or the last Subsequent AWV    I have reviewed the patient's medical history in detail and updated the computerized patient record.     Depression Risk Factor Screening:     3 most recent PHQ Screens 03/05/2020   Little interest or pleasure in doing things Not at all   Feeling down, depressed, irritable, or hopeless Not at all   Total Score PHQ 2 0   Trouble falling or staying asleep, or sleeping too much Not at all   Feeling tired or having little energy Not at all   Poor appetite, weight loss, or overeating Not at all   Feeling bad about yourself - or that you are a failure or have let yourself or your family down Not at all   Trouble concentrating on things such as school, work, reading, or watching TV Not at all   Moving or speaking so slowly that other people could have noticed; or the opposite being so fidgety that others notice Not at all   Thoughts of being better off dead, or hurting yourself in some way Not at all   PHQ 9 Score 0       Alcohol Risk Screen    Do you average more than 1 drink per night or more than 7 drinks a week:  No    On any one occasion in the past three months have you have had more than 3 drinks containing alcohol:  No        Functional Ability and Level of Safety:    Hearing: Hearing is good.      Activities of Daily Living:  The home contains: handrails and shower chair.  Patient does total self care      Ambulation: with no difficulty     Fall Risk:  Fall Risk Assessment, last 12 mths 03/08/2020   Able to walk? Yes   Fall in past 12 months? 0   Do you feel unsteady? 0   Are you worried about falling 0      Abuse Screen:  Patient is not abused       Cognitive Screening    Has your family/caregiver stated any concerns about your memory: no     Cognitive Screening: Normal - .    Assessment/Plan   Education and counseling provided:  Are appropriate based on today's review and  evaluation  End-of-Life planning (with patient's consent)    Diagnoses and all orders for this visit:    1. Medicare annual wellness visit, subsequent    2. HTN (hypertension), benign  -     METABOLIC PANEL, COMPREHENSIVE; Future    3. Pure hypercholesterolemia  -     LIPID PANEL; Future    4. Postablative hypothyroidism  -     TSH 3RD GENERATION; Future    5. Vitamin D deficiency  -     VITAMIN D, 25 HYDROXY; Future    6. Severe obesity (BMI 35.0-39.9) with comorbidity ([place])    7. Chronic diastolic congestive heart failure ([place])    8. Screening examination for pulmonary tuberculosis  -     AMB POC TUBERCULOSIS, INTRADERMAL (SKIN TEST)        Health Maintenance Due     Health Maintenance Due   Topic Date Due   ??? COVID-19 Vaccine (1) Never done   ??? Shingrix Vaccine Age 54> (1 of 2) Never done   ???  Colorectal Cancer Screening Combo  02/19/2012       Patient Care Team   Patient Care Team:  Thana Farr, MD as PCP - General (Internal Medicine)  Thana Farr, MD as PCP - [facility] Empaneled Provider  Vernetta Honey, MD (Ophthalmology)  Sloan Leiter, MD (Gastroenterology)  Jonette Eva, MD (Internal Medicine)  Donnelly Stager, MD (Neurology)  Carlyle Dolly, MD (Cardiology)  Fredirick Lathe, MD (Allergy)  Tressie Stalker, MD (Otolaryngology)  Melissa Montane, DO (Pulmonary Disease)    Glaucoma Screening-  UTD  Pneumonia Vaccine-  UTD  Shingles Vaccine-  Pt aware of Shingrinx  Tdap Vaccine- 10/2013  Colonoscopy-  03/2015 Dr Angelique Holm f/u 03/2020 pt has appt scheduled   Mammogram 12/2019  Advance Directive-  Pt given information     History     Patient Active Problem List   Diagnosis Code   ??? DOE (dyspnea on exertion) R06.00   ??? Hypothyroidism E03.9   ??? Pure hypercholesterolemia E78.00   ??? HTN (hypertension), benign I10   ??? GERD (gastroesophageal reflux disease) K21.9   ??? Asthma J45.909   ??? Vitamin D deficiency E55.9   ??? Iron deficiency anemia D50.9   ??? Submental lymphadenopathy R59.0   ??? Sciatica M54.30   ???  Depression F32.9   ??? Meralgia paraesthetica G57.10   ??? Obesity (BMI 30-39.9) E66.9   ??? Pre-diabetes R73.03   ??? Abnormal stress test R94.39   ??? Need for hepatitis C screening test Z11.59   ??? Glaucoma screening Z13.5   ??? Left knee pain M25.562   ??? Swelling of left lower extremity M79.89   ??? Osteoarthritis of left knee M17.12   ??? Post-menopausal Z78.0   ??? 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 Dehydration E86.0   ??? Migraine without aura and without status migrainosus, not intractable G43.009   ??? Claustrophobia F40.240   ??? Chronic midline low back pain without sciatica M54.5, G89.29   ??? Rash R21   ??? Encounter for long-term (current) use of medications Z79.899   ??? URI (upper respiratory infection) J06.9   ??? Cough R05   ??? Routine general medical examination at a health care facility Z00.00   ??? Need for pneumococcal vaccination Z23   ??? Hospital discharge follow-up Z09   ??? Chest pain R07.9   ??? Chronic diastolic congestive heart failure (HCC) I50.32   ??? Severe obesity (BMI 35.0-39.9) with comorbidity ([place]) E66.01   ??? Sore throat J02.9   ??? Memory problem R41.3     Past Medical History:   Diagnosis Date   ??? Anemia    ??? Asthma    ??? Colon polyp    ??? DDD (degenerative disc disease) 02/15/2009   ??? Depression 12/18/2011   ??? Essential hypertension, benign    ??? History of echocardiogram 03/20/2004    EF 70%.  Borderline DDfx.  No significant valvular pathology.   ??? Hypercholesterolemia    ??? Hypertension    ??? Iron deficiency anemia 99991111   ??? 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 Lower extremity venous duplex 10/23/2009    Left leg:  No DVT.     ??? Meralgia paraesthetica 10/22/2013   ??? OA (osteoarthritis of spine) 02/15/2009   ??? OA (osteoarthritis of spine) 02/15/2009   ??? Obesity, unspecified    ??? Other and unspecified hyperlipidemia    ??? Pre-operative cardiovascular examination     For spine surgery   ??? Right sided sciatica    ???  Shortness of breath     Possible asthma, HCVD; less likely CAD (Noted 03/15/09)   ??? Sleep apnea 02/15/2009    uses cpap  machine   ??? Thallium stress test  03/20/2004    Partially transient, mod basal & mid anterior defect most c/w artifact; mild anterior ischemia less likely.  Neg EKG on pharm stress test.   ??? Thyroid disease     hypothyroidism      Past Surgical History:   Procedure Laterality Date   ??? HX COLONOSCOPY  02-24-12    normal (Dr. Angelique Holm)   ??? HX HERNIA REPAIR      3x   ??? HX HYSTERECTOMY     ??? HX TUBAL LIGATION     ??? NEUROLOGICAL PROCEDURE UNLISTED  03-16-2009    s/p ACD & Fusion (Dr.P.Gurtner)   ??? PR COLONOSCOPY FLX DX W/COLLJ SPEC WHEN PFRMD  02-2007    +polyp(tubular adenoma); Dr Angelique Holm     Current Outpatient Medications   Medication Sig Dispense Refill   ??? gabapentin (NEURONTIN) 300 mg capsule Take 1 Cap by mouth every morning AND 2 Caps nightly. Max Daily Amount: 900 mg. 270 Cap 1   ??? omeprazole (PRILOSEC) 20 mg capsule TAKE 1 CAP BY MOUTH TWO (2) TIMES A DAY. 180 Cap 1   ??? fluticasone-umeclidin-vilanter (Trelegy Ellipta) 200-62.5-25 mcg dsdv Take 1 Puff by inhalation daily for 14 days. 1 Inhaler 0   ??? fluticasone-umeclidin-vilanter (Trelegy Ellipta) 200-62.5-25 mcg dsdv Take 1 Puff by inhalation daily. 3 Inhaler 4   ??? terbinafine HCL (LAMISIL) 250 mg tablet TAKE 1 TABLET EVERY DAY 90 Tab 0   ??? montelukast (SINGULAIR) 10 mg tablet take 1 tablet by mouth at bedtime if needed 90 Tab 1   ??? carvediloL (COREG) 6.25 mg tablet Take 1 Tab by mouth two (2) times daily (with meals). 180 Tab 3   ??? atorvastatin (LIPITOR) 40 mg tablet Take 1 Tab by mouth daily. 90 Tab 1   ??? levothyroxine (SYNTHROID) 150 mcg tablet Take 1 Tab by mouth Daily (before breakfast). 90 Tab 1   ??? valsartan-hydroCHLOROthiazide (DIOVAN-HCT) 160-25 mg per tablet Take 1 Tab by mouth daily. 90 Tab 3   ??? albuterol (PROVENTIL HFA, VENTOLIN HFA, PROAIR HFA) 90 mcg/actuation inhaler Take 2 Puffs by inhalation every four (4) hours as needed for Wheezing. 1 Inhaler 0   ??? OTHER Latanoprost .005% one drop each eye daily     ??? OTHER 1 Tab daily. Align Probiotic     ???  potassium chloride (KLOR-CON) 10 mEq tablet take 1 tablet by mouth once daily 90 Tab 3   ??? latanoprost (XALATAN) 0.005 % ophthalmic solution Administer 1 Drop to both eyes nightly.     ??? multivitamin (ONE A DAY) tablet Take 1 Tab by mouth daily.     ??? Cholecalciferol, Vitamin D3, (VITAMIN D-3) 5,000 unit Tab Take  by mouth daily.     ??? vit B complx C/folic acid/zinc (VIT B COMPLEX-C-FOLIC AC-ZINC PO) Take 1 Tab by mouth daily.       Allergies   Allergen Reactions   ??? Aspirin Other (comments)     GI distress,    ??? Asa-Acetaminophen-Caff-Potass Other (comments)     Bleeding in stomach   ??? Benadryl [Diphenhydramine Hcl] Other (comments)     Muscle jerking   ??? Other Plant, Animal, Environmental Not Reported This Time     Grass, Dust and Mold   ??? Pollen Extracts Not Reported This Time  Family History   Problem Relation Age of Onset   ??? Cancer Mother    ??? Hypertension Mother    ??? Hypertension Sister         x3   ??? Heart Surgery Sister    ??? Heart Disease Sister    ??? Heart Attack Sister    ??? Cancer Maternal Aunt         breast   ??? Breast Cancer Maternal Aunt    ??? Glaucoma Son    ??? Heart defect Son    ??? Diabetes Paternal Uncle    ??? Hypertension Paternal Uncle      Social History     Tobacco Use   ??? Smoking status: Former Smoker     Packs/day: 1.00     Years: 28.00     Pack years: 28.00     Start date: 10/16/1977     Quit date: 04/25/2004     Years since quitting: 15.8   ??? Smokeless tobacco: Never Used   Substance Use Topics   ??? Alcohol use: No     A comprehensive 5 year plan for medical care and screening exams was reviewed with pt and they received a copy of it.

## 2020-03-05 NOTE — Progress Notes (Signed)
Patient is in the office today for a 6 month follow up, and Medicare Wellness Visit.      1. Have you been to the ER, urgent care clinic since your last visit?  Hospitalized since your last visit?No    2. Have you seen or consulted any other health care providers outside of the [facility] since your last visit?  Include any pap smears or colon screening. No      Verbal order read back per Dr. Madelyn Brunner PPD placement.  Patient received PPD in left forearm.  Patient was observed and no signs or symptoms of an allergic reaction at this time.   Patient tolerated well and left without complaints.  Patient will return on Friday 03/08/2020 for reading.

## 2020-03-05 NOTE — Progress Notes (Signed)
Elaine [name] is a 69 y.o.  female and presents with Hypertension, Cholesterol Problem (f/u), Annual Wellness Visit, PPD Placement, and Vitamin D Deficiency      SUBJECTIVE:  [name] HTN is well controlled on   DiovanHct 160/25 and Coreg 6.25 mg BID   [name] high cholesterol has been borderline controlled on Lipitor 40 mg. Pt  denies any side effects like myalgias. Compliance with medications has been an issue.  Pt has been controlling her prediabetes with diet and weight loss.      Thyroid Review:  Patient is seen for followup of hypothyroidism.   Thyroid ROS: denies fatigue, weight changes, heat/cold intolerance, bowel/skin changes or CVS symptoms. 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  Her TSH levels are good on Synthroid 150 mcg daily  With missing the weekend dose on Saturday and Sunday.    [name] vit D level is well high on vit D 5000 units/day. Pt will also hold this medication on the weekend.     Patient has sleep apnea and is using a CPAP machine and followed by sleep specialist    She continues to have fungal infection in her toenails which is slowly improving with Lamisil.     Patient has asthma that is controlled on Singulair and  trelegy Ellipta.  Patient is followed by pulmonary who also sees her for sleep apnea.      Patient will continue to work on trying to lose weight and decrease in her BMI from 35 with cutting back starches and sweets and not being as active as possible.    Patient has chronic diastolic heart failure which is stable and followed by cardiology    Respiratory ROS: negative for - shortness of breath  Cardiovascular ROS: negative for - chest pain    Current Outpatient Medications   Medication Sig   ??? gabapentin (NEURONTIN) 300 mg capsule Take 1 Cap by mouth every morning AND 2 Caps nightly. Max Daily Amount: 900 mg.   ??? omeprazole (PRILOSEC) 20 mg capsule TAKE 1 CAP BY MOUTH TWO (2) TIMES A  DAY.   ??? fluticasone-umeclidin-vilanter (Trelegy Ellipta) 200-62.5-25 mcg dsdv Take 1 Puff by inhalation daily for 14 days.   ??? fluticasone-umeclidin-vilanter (Trelegy Ellipta) 200-62.5-25 mcg dsdv Take 1 Puff by inhalation daily.   ??? terbinafine HCL (LAMISIL) 250 mg tablet TAKE 1 TABLET EVERY DAY   ??? montelukast (SINGULAIR) 10 mg tablet take 1 tablet by mouth at bedtime if needed   ??? carvediloL (COREG) 6.25 mg tablet Take 1 Tab by mouth two (2) times daily (with meals).   ??? atorvastatin (LIPITOR) 40 mg tablet Take 1 Tab by mouth daily.   ??? levothyroxine (SYNTHROID) 150 mcg tablet Take 1 Tab by mouth Daily (before breakfast).   ??? valsartan-hydroCHLOROthiazide (DIOVAN-HCT) 160-25 mg per tablet Take 1 Tab by mouth daily.   ??? albuterol (PROVENTIL HFA, VENTOLIN HFA, PROAIR HFA) 90 mcg/actuation inhaler Take 2 Puffs by inhalation every four (4) hours as needed for Wheezing.   ??? OTHER Latanoprost .005% one drop each eye daily   ??? OTHER 1 Tab daily. Align Probiotic   ??? potassium chloride (KLOR-CON) 10 mEq tablet take 1 tablet by mouth once daily   ??? latanoprost (XALATAN) 0.005 % ophthalmic solution Administer 1 Drop to both eyes nightly.   ??? multivitamin (ONE A DAY) tablet Take 1 Tab by mouth daily.   ??? Cholecalciferol, Vitamin D3, (VITAMIN D-3) 5,000 unit   Tab Take  by mouth daily.   ??? vit B complx C/folic acid/zinc (VIT B COMPLEX-C-FOLIC AC-ZINC PO) Take 1 Tab by mouth daily.     No current facility-administered medications for this visit.          OBJECTIVE:  alert, well appearing, and in no distress  Visit Vitals  BP (!) 128/50 (BP 1 Location: Left arm, BP Patient Position: Sitting, BP Cuff Size: Adult)   Pulse 95   Temp 97.2 ??F (36.2 ??C) (Temporal)   Resp 20   Ht 5' 2" (1.575 m)   Wt 196 lb (88.9 kg)   SpO2 99%   BMI 35.85 kg/m??      well developed and well nourished  Chest - clear to auscultation, no wheezes, rales or rhonchi, symmetric air entry  Heart - normal rate, regular rhythm, normal S1, S2, no murmurs, rubs,  clicks or gallops  Extremities - peripheral pulses normal, no pedal edema, no clubbing or cyanosis    Labs:   Lab Results   Component Value Date/Time    Cholesterol, total 160 02/27/2020 08:33 AM    HDL Cholesterol 57 02/27/2020 08:33 AM    LDL, calculated 86 02/27/2020 08:33 AM    LDL, calculated 104 (H) 06/06/2019 09:38 AM    Triglyceride 90 02/27/2020 08:33 AM    CHOL/HDL Ratio 3.2 10/10/2018 03:55 PM     Lab Results   Component Value Date/Time    Sodium 140 02/27/2020 08:33 AM    Potassium 3.9 02/27/2020 08:33 AM    Chloride 96 02/27/2020 08:33 AM    CO2 31 (H) 02/27/2020 08:33 AM    Anion gap 3 10/10/2018 03:55 PM    Glucose 94 02/27/2020 08:33 AM    BUN 9 02/27/2020 08:33 AM    Creatinine 0.73 02/27/2020 08:33 AM    BUN/Creatinine ratio 12 02/27/2020 08:33 AM    GFR est AA 96 02/27/2020 08:33 AM    GFR est non-AA 84 02/27/2020 08:33 AM    Calcium 9.8 02/27/2020 08:33 AM    Bilirubin, total 0.5 02/27/2020 08:33 AM    ALT (SGPT) 18 02/27/2020 08:33 AM    Alk. phosphatase 86 02/27/2020 08:33 AM    Protein, total 7.1 02/27/2020 08:33 AM    Albumin 4.4 02/27/2020 08:33 AM    Globulin 3.1 10/10/2018 03:55 PM    A-G Ratio 1.6 02/27/2020 08:33 AM      Lab Results   Component Value Date/Time    Hemoglobin A1c 5.4 02/27/2020 08:33 AM      Labs:   Lab Results   Component Value Date/Time    TSH 1.160 02/27/2020 08:33 AM    T4, Free 1.3 03/29/2017 05:30 PM          Discussed the patient's BMI with her.  The BMI follow up plan is as follows: I have counseled this patient on diet and exercise regimens.        Assessment/Plan      ICD-10-CM ICD-9-CM    1. Medicare annual wellness visit, subsequent  Z00.00 V70.0    2. HTN (hypertension), benign  I10 401.1  controlled on Diovan HCT and Coreg METABOLIC PANEL, COMPREHENSIVE   3. 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 Vitamin D deficiency  E55.9 268.9  levels too high on vitamin D 5000  units/day so patient will hold weekend dose VITAMIN D, 25 HYDROXY   6. Severe obesity (BMI 35.0-39.9) with comorbidity ([place])  E66.01 278.01  patient will continue to work on trying to lose weight by cutting back starches and sweets in her diet   7. Chronic diastolic congestive heart failure (HCC)  I50.32 428.32  stable on current medications and followed by cardiology     428.0    8. Screening examination for pulmonary tuberculosis  Z11.1 V74.1 AMB POC TUBERCULOSIS, INTRADERMAL (SKIN TEST)         Follow-up and Dispositions    ?? Return in about 6 months (around 09/05/2020) for labs 1 week before.           Reviewed plan of care. Patient has provided input and agrees with goals.

## 2020-03-05 NOTE — Progress Notes (Addendum)
Patient is in the office today for a 6 month follow up, and Medicare Wellness Visit.      1. Have you been to the ER, urgent care clinic since your last visit?  Hospitalized since your last visit?No    2. Have you seen or consulted any other health care providers outside of the [facility] since your last visit?  Include any pap smears or colon screening. No      Verbal order read back per Dr. Madelyn Brunner PPD placement.  Patient received PPD in left forearm.  Patient was observed and no signs or symptoms of an allergic reaction at this time.   Patient tolerated well and left without complaints.  Patient will return on Friday 03/08/2020 for reading.

## 2020-03-05 NOTE — Progress Notes (Signed)
Elaine [name] is a 69 y.o.  female and presents with Hypertension, Cholesterol Problem (f/u), Annual Wellness Visit, PPD Placement, and Vitamin D Deficiency      SUBJECTIVE:  [name] HTN is well controlled on   DiovanHct 160/25 and Coreg 6.25 mg BID   [name] high cholesterol has been borderline controlled on Lipitor 40 mg. Pt  denies any side effects like myalgias. Compliance with medications has been an issue.  Pt has been controlling her prediabetes with diet and weight loss.      Thyroid Review:  Patient is seen for followup of hypothyroidism.   Thyroid ROS: denies fatigue, weight changes, heat/cold intolerance, bowel/skin changes or CVS symptoms. 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  Her TSH levels are good on Synthroid 150 mcg daily  With missing the weekend dose on Saturday and Sunday.    [name] vit D level is well high on vit D 5000 units/day. Pt will also hold this medication on the weekend.     Patient has sleep apnea and is using a CPAP machine and followed by sleep specialist    She continues to have fungal infection in her toenails which is slowly improving with Lamisil.     Patient has asthma that is controlled on Singulair and  trelegy Ellipta.  Patient is followed by pulmonary who also sees her for sleep apnea.      Patient will continue to work on trying to lose weight and decrease in her BMI from 35 with cutting back starches and sweets and not being as active as possible.    Patient has chronic diastolic heart failure which is stable and followed by cardiology    Respiratory ROS: negative for - shortness of breath  Cardiovascular ROS: negative for - chest pain    Current Outpatient Medications   Medication Sig   ??? gabapentin (NEURONTIN) 300 mg capsule Take 1 Cap by mouth every morning AND 2 Caps nightly. Max Daily Amount: 900 mg.   ??? omeprazole (PRILOSEC) 20 mg capsule TAKE 1 CAP BY MOUTH TWO (2) TIMES A  DAY.   ??? fluticasone-umeclidin-vilanter (Trelegy Ellipta) 200-62.5-25 mcg dsdv Take 1 Puff by inhalation daily for 14 days.   ??? fluticasone-umeclidin-vilanter (Trelegy Ellipta) 200-62.5-25 mcg dsdv Take 1 Puff by inhalation daily.   ??? terbinafine HCL (LAMISIL) 250 mg tablet TAKE 1 TABLET EVERY DAY   ??? montelukast (SINGULAIR) 10 mg tablet take 1 tablet by mouth at bedtime if needed   ??? carvediloL (COREG) 6.25 mg tablet Take 1 Tab by mouth two (2) times daily (with meals).   ??? atorvastatin (LIPITOR) 40 mg tablet Take 1 Tab by mouth daily.   ??? levothyroxine (SYNTHROID) 150 mcg tablet Take 1 Tab by mouth Daily (before breakfast).   ??? valsartan-hydroCHLOROthiazide (DIOVAN-HCT) 160-25 mg per tablet Take 1 Tab by mouth daily.   ??? albuterol (PROVENTIL HFA, VENTOLIN HFA, PROAIR HFA) 90 mcg/actuation inhaler Take 2 Puffs by inhalation every four (4) hours as needed for Wheezing.   ??? OTHER Latanoprost .005% one drop each eye daily   ??? OTHER 1 Tab daily. Align Probiotic   ??? potassium chloride (KLOR-CON) 10 mEq tablet take 1 tablet by mouth once daily   ??? latanoprost (XALATAN) 0.005 % ophthalmic solution Administer 1 Drop to both eyes nightly.   ??? multivitamin (ONE A DAY) tablet Take 1 Tab by mouth daily.   ??? Cholecalciferol, Vitamin D3, (VITAMIN D-3) 5,000 unit  Tab Take  by mouth daily.   ??? vit B complx C/folic acid/zinc (VIT B COMPLEX-C-FOLIC AC-ZINC PO) Take 1 Tab by mouth daily.     No current facility-administered medications for this visit.          OBJECTIVE:  alert, well appearing, and in no distress  Visit Vitals  BP (!) 128/50 (BP 1 Location: Left arm, BP Patient Position: Sitting, BP Cuff Size: Adult)   Pulse 95   Temp 97.2 ??F (36.2 ??C) (Temporal)   Resp 20   Ht 5' 2" (1.575 m)   Wt 196 lb (88.9 kg)   SpO2 99%   BMI 35.85 kg/m??      well developed and well nourished  Chest - clear to auscultation, no wheezes, rales or rhonchi, symmetric air entry  Heart - normal rate, regular rhythm, normal S1, S2, no murmurs, rubs,  clicks or gallops  Extremities - peripheral pulses normal, no pedal edema, no clubbing or cyanosis    Labs:   Lab Results   Component Value Date/Time    Cholesterol, total 160 02/27/2020 08:33 AM    HDL Cholesterol 57 02/27/2020 08:33 AM    LDL, calculated 86 02/27/2020 08:33 AM    LDL, calculated 104 (H) 06/06/2019 09:38 AM    Triglyceride 90 02/27/2020 08:33 AM    CHOL/HDL Ratio 3.2 10/10/2018 03:55 PM     Lab Results   Component Value Date/Time    Sodium 140 02/27/2020 08:33 AM    Potassium 3.9 02/27/2020 08:33 AM    Chloride 96 02/27/2020 08:33 AM    CO2 31 (H) 02/27/2020 08:33 AM    Anion gap 3 10/10/2018 03:55 PM    Glucose 94 02/27/2020 08:33 AM    BUN 9 02/27/2020 08:33 AM    Creatinine 0.73 02/27/2020 08:33 AM    BUN/Creatinine ratio 12 02/27/2020 08:33 AM    GFR est AA 96 02/27/2020 08:33 AM    GFR est non-AA 84 02/27/2020 08:33 AM    Calcium 9.8 02/27/2020 08:33 AM    Bilirubin, total 0.5 02/27/2020 08:33 AM    ALT (SGPT) 18 02/27/2020 08:33 AM    Alk. phosphatase 86 02/27/2020 08:33 AM    Protein, total 7.1 02/27/2020 08:33 AM    Albumin 4.4 02/27/2020 08:33 AM    Globulin 3.1 10/10/2018 03:55 PM    A-G Ratio 1.6 02/27/2020 08:33 AM      Lab Results   Component Value Date/Time    Hemoglobin A1c 5.4 02/27/2020 08:33 AM      Labs:   Lab Results   Component Value Date/Time    TSH 1.160 02/27/2020 08:33 AM    T4, Free 1.3 03/29/2017 05:30 PM          Discussed the patient's BMI with her.  The BMI follow up plan is as follows: I have counseled this patient on diet and exercise regimens.        Assessment/Plan      ICD-10-CM ICD-9-CM    1. Medicare annual wellness visit, subsequent  Z00.00 V70.0    2. HTN (hypertension), benign  I10 401.1  controlled on Diovan HCT and Coreg METABOLIC PANEL, COMPREHENSIVE   3. 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 Vitamin D deficiency  E55.9 268.9  levels too high on vitamin D 5000  units/day so patient will hold weekend dose VITAMIN D, 25 HYDROXY   6. Severe obesity (BMI 35.0-39.9) with comorbidity ([place])  E66.01 278.01  patient will continue to work on trying to lose weight by cutting back starches and sweets in her diet   7. Chronic diastolic congestive heart failure (H[facility])  I50.32 428.32  stable on current medications and followed by cardiology     428.0    8. Screening examination for pulmonary tuberculosis  Z11.1 V74.1 AMB POC TUBERCULOSIS, INTRADERMAL (SKIN TEST)         Follow-up and Dispositions    ?? Return in about 6 months (around 09/05/2020) for labs 1 week before.           Reviewed plan of care. Patient has provided input and agrees with goals.

## 2020-03-05 NOTE — Patient Instructions (Addendum)
High Blood Pressure: Care Instructions  Overview     It's normal for blood pressure to go up and down throughout the day. But if it stays up, you have high blood pressure. Another name for high blood pressure is hypertension.  Despite what a lot of people think, high blood pressure usually doesn't cause headaches or make you feel dizzy or lightheaded. It usually has no symptoms. But it does increase your risk of stroke, heart attack, and other problems. You and your doctor will talk about your risks of these problems based on your blood pressure.  Your doctor will give you a goal for your blood pressure. Your goal will be based on your health and your age.  Lifestyle changes, such as eating healthy and being active, are always important to help lower blood pressure. You might also take medicine to reach your blood pressure goal.  Follow-up care is a key part of your treatment and safety. Be sure to make and go to all appointments, and call your doctor if you are having problems. It's also a good idea to know your test results and keep a list of the medicines you take.  How can you care for yourself at home?  Medical treatment  ?? If you stop taking your medicine, your blood pressure will go back up. You may take one or more types of medicine to lower your blood pressure. Be safe with medicines. Take your medicine exactly as prescribed. Call your doctor if you think you are having a problem with your medicine.  ?? Talk to your doctor before you start taking aspirin every day. Aspirin can help certain people lower their risk of a heart attack or stroke. But taking aspirin isn't right for everyone, because it can cause serious bleeding.  ?? See your doctor regularly. You may need to see the doctor more often at first or until your blood pressure comes down.  ?? If you are taking blood pressure medicine, talk to your doctor before you take decongestants or anti-inflammatory medicine, such as ibuprofen. Some of these  medicines can raise blood pressure.  ?? Learn how to check your blood pressure at home.  Lifestyle changes  ?? Stay at a healthy weight. This is especially important if you put on weight around the waist. Losing even 10 pounds can help you lower your blood pressure.  ?? If your doctor recommends it, get more exercise. Walking is a good choice. Bit by bit, increase the amount you walk every day. Try for at least 30 minutes on most days of the week. You also may want to swim, bike, or do other activities.  ?? Avoid or limit alcohol. Talk to your doctor about whether you can drink any alcohol.  ?? Try to limit how much sodium you eat to less than 2,300 milligrams (mg) a day. Your doctor may ask you to try to eat less than 1,500 mg a day.  ?? Eat plenty of fruits (such as bananas and oranges), vegetables, legumes, whole grains, and low-fat dairy products.  ?? Lower the amount of saturated fat in your diet. Saturated fat is found in animal products such as milk, cheese, and meat. Limiting these foods may help you lose weight and also lower your risk for heart disease.  ?? Do not smoke. Smoking increases your risk for heart attack and stroke. If you need help quitting, talk to your doctor about stop-smoking programs and medicines. These can increase your chances of quitting for good.    When should you call for help?   Call  911 anytime you think you may need emergency care. This may mean having symptoms that suggest that your blood pressure is causing a serious heart or blood vessel problem. Your blood pressure may be over 180/120.  For example, call 911 if:  ?? ?? You have symptoms of a heart attack. These may include:  ? Chest pain or pressure, or a strange feeling in the chest.  ? Sweating.  ? Shortness of breath.  ? Nausea or vomiting.  ? Pain, pressure, or a strange feeling in the back, neck, jaw, or upper belly or in one or both shoulders or arms.  ? Lightheadedness or sudden weakness.  ? A fast or irregular heartbeat.   ?? ??  You have symptoms of a stroke. These may include:  ? Sudden numbness, tingling, weakness, or loss of movement in your face, arm, or leg, especially on only one side of your body.  ? Sudden vision changes.  ? Sudden trouble speaking.  ? Sudden confusion or trouble understanding simple statements.  ? Sudden problems with walking or balance.  ? A sudden, severe headache that is different from past headaches.   ?? ?? You have severe back or belly pain.   Do not wait until your blood pressure comes down on its own. Get help right away.  Call your doctor now or seek immediate care if:  ?? ?? Your blood pressure is much higher than normal (such as 180/120 or higher), but you don't have symptoms.   ?? ?? You think high blood pressure is causing symptoms, such as:  ? Severe headache.  ? Blurry vision.   Watch closely for changes in your health, and be sure to contact your doctor if:  ?? ?? Your blood pressure measures higher than your doctor recommends at least 2 times. That means the top number is higher or the bottom number is higher, or both.   ?? ?? You think you may be having side effects from your blood pressure medicine.   Where can you learn more?  Go to http://[facility].info/  Enter [phone] in the search box to learn more about "High Blood Pressure: Care Instructions."  Current as of: November 21, 2018??????????????????????????????Content Version: 12.6  ?? 2006-2020 [employer].   Care instructions adapted under license by [employer] (which disclaims liability or warranty for this information). If you have questions about a medical condition or this instruction, always ask your healthcare professional. [place] any warranty or liability for your use of this information.      Medicare Wellness Visit, Female     The best way to live healthy is to have a lifestyle where you eat a well-balanced diet, exercise regularly, limit alcohol use, and quit all forms of  tobacco/nicotine, if applicable.     Regular preventive services are another way to keep healthy. Preventive services (vaccines, screening tests, monitoring & exams) can help personalize your care plan, which helps you manage your own care. Screening tests can find health problems at the earliest stages, when they are easiest to treat.   [facility] follows the current, evidence-based guidelines published by the [place] [employer] (USPSTF) when recommending preventive services for our patients. Because we follow these guidelines, sometimes recommendations change over time as research supports it. (For example, mammograms used to be recommended annually. Even though Medicare will still pay for an annual mammogram, the newer guidelines recommend  a mammogram every two years for women of average risk).  Of course, you and your doctor may decide to screen more often for some diseases, based on your risk and your co-morbidities (chronic disease you are already diagnosed with).     Preventive services for you include:  - Medicare offers their members a free annual wellness visit, which is time for you and your primary care provider to discuss and plan for your preventive service needs. Take advantage of this benefit every year!  -All adults over the age of 20 should receive the recommended pneumonia vaccines. Current USPSTF guidelines recommend a series of two vaccines for the best pneumonia protection.   -All adults should have a flu vaccine yearly and a tetanus vaccine every 10 years.   -All adults age 31 and older should receive the shingles vaccines (series of two vaccines).      -All adults age 68-70 who are overweight should have a diabetes screening test once every three years.   -All adults born between [birth year] and 1966 should be screened once for Hepatitis C.  -Other screening tests and preventive services for persons with diabetes include: an eye exam to screen for  diabetic retinopathy, a kidney function test, a foot exam, and stricter control over your cholesterol.   -Cardiovascular screening for adults with routine risk involves an electrocardiogram (ECG) at intervals determined by your doctor.   -Colorectal cancer screenings should be done for adults age 70-75 with no increased risk factors for colorectal cancer.  There are a number of acceptable methods of screening for this type of cancer. Each test has its own benefits and drawbacks. Discuss with your doctor what is most appropriate for you during your annual wellness visit. The different tests include: colonoscopy (considered the best screening method), a fecal occult blood test, a fecal DNA test, and sigmoidoscopy.    -A bone mass density test is recommended when a woman turns 65 to screen for osteoporosis. This test is only recommended one time, as a screening. Some providers will use this same test as a disease monitoring tool if you already have osteoporosis.  -Breast cancer screenings are recommended every other year for women of normal risk, age 31-74.  -Cervical cancer screenings for women over age 51 are only recommended with certain risk factors.     Here is a list of your current Health Maintenance items (your personalized list of preventive services) with a due date:  Health Maintenance Due   Topic Date Due   ??? COVID-19 Vaccine (1) Never done   ??? Shingles Vaccine (1 of 2) Never done   ??? Colorectal Screening  02/19/2012

## 2020-03-05 NOTE — Progress Notes (Signed)
This is the Subsequent Medicare Annual Wellness Exam, performed 12 months or more after the Initial AWV or the last Subsequent AWV    I have reviewed the patient's medical history in detail and updated the computerized patient record.     Depression Risk Factor Screening:     3 most recent PHQ Screens 03/05/2020   Little interest or pleasure in doing things Not at all   Feeling down, depressed, irritable, or hopeless Not at all   Total Score PHQ 2 0   Trouble falling or staying asleep, or sleeping too much Not at all   Feeling tired or having little energy Not at all   Poor appetite, weight loss, or overeating Not at all   Feeling bad about yourself - or that you are a failure or have let yourself or your family down Not at all   Trouble concentrating on things such as school, work, reading, or watching TV Not at all   Moving or speaking so slowly that other people could have noticed; or the opposite being so fidgety that others notice Not at all   Thoughts of being better off dead, or hurting yourself in some way Not at all   PHQ 9 Score 0       Alcohol Risk Screen    Do you average more than 1 drink per night or more than 7 drinks a week:  No    On any one occasion in the past three months have you have had more than 3 drinks containing alcohol:  No        Functional Ability and Level of Safety:    Hearing: Hearing is good.      Activities of Daily Living:  The home contains: handrails and shower chair.  Patient does total self care      Ambulation: with no difficulty     Fall Risk:  Fall Risk Assessment, last 12 mths 03/08/2020   Able to walk? Yes   Fall in past 12 months? 0   Do you feel unsteady? 0   Are you worried about falling 0      Abuse Screen:  Patient is not abused       Cognitive Screening    Has your family/caregiver stated any concerns about your memory: no     Cognitive Screening: Normal - .    Assessment/Plan   Education and counseling provided:  Are appropriate based on today's review and evaluation   End-of-Life planning (with patient's consent)    Diagnoses and all orders for this visit:    1. Medicare annual wellness visit, subsequent    2. HTN (hypertension), benign  -     METABOLIC PANEL, COMPREHENSIVE; Future    3. Pure hypercholesterolemia  -     LIPID PANEL; Future    4. Postablative hypothyroidism  -     TSH 3RD GENERATION; Future    5. Vitamin D deficiency  -     VITAMIN D, 25 HYDROXY; Future    6. Severe obesity (BMI 35.0-39.9) with comorbidity ([place])    7. Chronic diastolic congestive heart failure ([place])    8. Screening examination for pulmonary tuberculosis  -     AMB POC TUBERCULOSIS, INTRADERMAL (SKIN TEST)        Health Maintenance Due     Health Maintenance Due   Topic Date Due   ??? COVID-19 Vaccine (1) Never done   ??? Shingrix Vaccine Age 110> (1 of 2) Never done   ???  Colorectal Cancer Screening Combo  02/19/2012       Patient Care Team   Patient Care Team:  Thana Farr, MD as PCP - General (Internal Medicine)  Thana Farr, MD as PCP - [facility] Empaneled Provider  Vernetta Honey, MD (Ophthalmology)  Sloan Leiter, MD (Gastroenterology)  Jonette Eva, MD (Internal Medicine)  Donnelly Stager, MD (Neurology)  Carlyle Dolly, MD (Cardiology)  Fredirick Lathe, MD (Allergy)  Tressie Stalker, MD (Otolaryngology)  Melissa Montane, DO (Pulmonary Disease)    Glaucoma Screening-  UTD  Pneumonia Vaccine-  UTD  Shingles Vaccine-  Pt aware of Shingrinx  Tdap Vaccine- 10/2013  Colonoscopy-  03/2015 Dr Angelique Holm f/u 03/2020 pt has appt scheduled   Mammogram 12/2019  Advance Directive-  Pt given information     History     Patient Active Problem List   Diagnosis Code   ??? DOE (dyspnea on exertion) R06.00   ??? Hypothyroidism E03.9   ??? Pure hypercholesterolemia E78.00   ??? HTN (hypertension), benign I10   ??? GERD (gastroesophageal reflux disease) K21.9   ??? Asthma J45.909   ??? Vitamin D deficiency E55.9   ??? Iron deficiency anemia D50.9   ??? Submental lymphadenopathy R59.0   ??? Sciatica M54.30   ??? Depression  F32.9   ??? Meralgia paraesthetica G57.10   ??? Obesity (BMI 30-39.9) E66.9   ??? Pre-diabetes R73.03   ??? Abnormal stress test R94.39   ??? Need for hepatitis C screening test Z11.59   ??? Glaucoma screening Z13.5   ??? Left knee pain M25.562   ??? Swelling of left lower extremity M79.89   ??? Osteoarthritis of left knee M17.12   ??? Post-menopausal Z78.0   ??? 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 Dehydration E86.0   ??? Migraine without aura and without status migrainosus, not intractable G43.009   ??? Claustrophobia F40.240   ??? Chronic midline low back pain without sciatica M54.5, G89.29   ??? Rash R21   ??? Encounter for long-term (current) use of medications Z79.899   ??? URI (upper respiratory infection) J06.9   ??? Cough R05   ??? Routine general medical examination at a health care facility Z00.00   ??? Need for pneumococcal vaccination Z23   ??? Hospital discharge follow-up Z09   ??? Chest pain R07.9   ??? Chronic diastolic congestive heart failure (HCC) I50.32   ??? Severe obesity (BMI 35.0-39.9) with comorbidity ([place]) E66.01   ??? Sore throat J02.9   ??? Memory problem R41.3     Past Medical History:   Diagnosis Date   ??? Anemia    ??? Asthma    ??? Colon polyp    ??? DDD (degenerative disc disease) 02/15/2009   ??? Depression 12/18/2011   ??? Essential hypertension, benign    ??? History of echocardiogram 03/20/2004    EF 70%.  Borderline DDfx.  No significant valvular pathology.   ??? Hypercholesterolemia    ??? Hypertension    ??? Iron deficiency anemia 99991111   ??? 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 Lower extremity venous duplex 10/23/2009    Left leg:  No DVT.     ??? Meralgia paraesthetica 10/22/2013   ??? OA (osteoarthritis of spine) 02/15/2009   ??? OA (osteoarthritis of spine) 02/15/2009   ??? Obesity, unspecified    ??? Other and unspecified hyperlipidemia    ??? Pre-operative cardiovascular examination     For spine surgery   ??? Right sided sciatica    ???  Shortness of breath     Possible asthma, HCVD; less likely CAD (Noted 03/15/09)   ??? Sleep apnea 02/15/2009    uses cpap machine   ???  Thallium stress test  03/20/2004    Partially transient, mod basal & mid anterior defect most c/w artifact; mild anterior ischemia less likely.  Neg EKG on pharm stress test.   ??? Thyroid disease     hypothyroidism      Past Surgical History:   Procedure Laterality Date   ??? HX COLONOSCOPY  02-24-12    normal (Dr. Angelique Holm)   ??? HX HERNIA REPAIR      3x   ??? HX HYSTERECTOMY     ??? HX TUBAL LIGATION     ??? NEUROLOGICAL PROCEDURE UNLISTED  03-16-2009    s/p ACD & Fusion (Dr.P.Gurtner)   ??? PR COLONOSCOPY FLX DX W/COLLJ SPEC WHEN PFRMD  02-2007    +polyp(tubular adenoma); Dr Angelique Holm     Current Outpatient Medications   Medication Sig Dispense Refill   ??? gabapentin (NEURONTIN) 300 mg capsule Take 1 Cap by mouth every morning AND 2 Caps nightly. Max Daily Amount: 900 mg. 270 Cap 1   ??? omeprazole (PRILOSEC) 20 mg capsule TAKE 1 CAP BY MOUTH TWO (2) TIMES A DAY. 180 Cap 1   ??? fluticasone-umeclidin-vilanter (Trelegy Ellipta) 200-62.5-25 mcg dsdv Take 1 Puff by inhalation daily for 14 days. 1 Inhaler 0   ??? fluticasone-umeclidin-vilanter (Trelegy Ellipta) 200-62.5-25 mcg dsdv Take 1 Puff by inhalation daily. 3 Inhaler 4   ??? terbinafine HCL (LAMISIL) 250 mg tablet TAKE 1 TABLET EVERY DAY 90 Tab 0   ??? montelukast (SINGULAIR) 10 mg tablet take 1 tablet by mouth at bedtime if needed 90 Tab 1   ??? carvediloL (COREG) 6.25 mg tablet Take 1 Tab by mouth two (2) times daily (with meals). 180 Tab 3   ??? atorvastatin (LIPITOR) 40 mg tablet Take 1 Tab by mouth daily. 90 Tab 1   ??? levothyroxine (SYNTHROID) 150 mcg tablet Take 1 Tab by mouth Daily (before breakfast). 90 Tab 1   ??? valsartan-hydroCHLOROthiazide (DIOVAN-HCT) 160-25 mg per tablet Take 1 Tab by mouth daily. 90 Tab 3   ??? albuterol (PROVENTIL HFA, VENTOLIN HFA, PROAIR HFA) 90 mcg/actuation inhaler Take 2 Puffs by inhalation every four (4) hours as needed for Wheezing. 1 Inhaler 0   ??? OTHER Latanoprost .005% one drop each eye daily     ??? OTHER 1 Tab daily. Align Probiotic     ??? potassium  chloride (KLOR-CON) 10 mEq tablet take 1 tablet by mouth once daily 90 Tab 3   ??? latanoprost (XALATAN) 0.005 % ophthalmic solution Administer 1 Drop to both eyes nightly.     ??? multivitamin (ONE A DAY) tablet Take 1 Tab by mouth daily.     ??? Cholecalciferol, Vitamin D3, (VITAMIN D-3) 5,000 unit Tab Take  by mouth daily.     ??? vit B complx C/folic acid/zinc (VIT B COMPLEX-C-FOLIC AC-ZINC PO) Take 1 Tab by mouth daily.       Allergies   Allergen Reactions   ??? Aspirin Other (comments)     GI distress,    ??? Asa-Acetaminophen-Caff-Potass Other (comments)     Bleeding in stomach   ??? Benadryl [Diphenhydramine Hcl] Other (comments)     Muscle jerking   ??? Other Plant, Animal, Environmental Not Reported This Time     Grass, Dust and Mold   ??? Pollen Extracts Not Reported This Time  Family History   Problem Relation Age of Onset   ??? Cancer Mother    ??? Hypertension Mother    ??? Hypertension Sister         x3   ??? Heart Surgery Sister    ??? Heart Disease Sister    ??? Heart Attack Sister    ??? Cancer Maternal Aunt         breast   ??? Breast Cancer Maternal Aunt    ??? Glaucoma Son    ??? Heart defect Son    ??? Diabetes Paternal Uncle    ??? Hypertension Paternal Uncle      Social History     Tobacco Use   ??? Smoking status: Former Smoker     Packs/day: 1.00     Years: 28.00     Pack years: 28.00     Start date: 10/16/1977     Quit date: 04/25/2004     Years since quitting: 15.8   ??? Smokeless tobacco: Never Used   Substance Use Topics   ??? Alcohol use: No     A comprehensive 5 year plan for medical care and screening exams was reviewed with pt and they received a copy of it.

## 2020-03-07 ENCOUNTER — Ambulatory Visit: Payer: Medicare Other | Attending: Internal Medicine

## 2020-03-07 DIAGNOSIS — Z23 Encounter for immunization: Secondary | ICD-10-CM

## 2020-03-07 NOTE — Progress Notes (Signed)
   Covid-19 Vaccination Clinic  Name:  Michelle Chavez    MRN: [id number] DOB: 01-05-1951  03/07/2020  [name] was observed post Covid-19 immunization for 15 minutes without incident. She was provided with Vaccine Information Sheet and instruction to access the V-Safe system.   [name] was instructed to call 911 with any severe reactions post vaccine: [name] Difficulty breathing  . Swelling of face and throat  . A fast heartbeat  . A bad rash all over body  . Dizziness and weakness   Immunizations Administered    Name Date Dose VIS Date Route   Pfizer COVID-19 Vaccine 03/07/2020  1:01 PM 0.3 mL 11/17/2019 Intramuscular   Manufacturer: [employer], [employer]   Lot: [id number]   NDC: [address]-4

## 2020-03-08 ENCOUNTER — Institutional Professional Consult (permissible substitution): Admit: 2020-03-08 | Discharge: 2020-03-08 | Payer: MEDICARE | Primary: Internal Medicine

## 2020-03-08 DIAGNOSIS — Z111 Encounter for screening for respiratory tuberculosis: Secondary | ICD-10-CM

## 2020-03-08 LAB — AMB POC TUBERCULOSIS, INTRADERMAL (SKIN TEST)
PPD, (POC): NEGATIVE Negative
PPD: NEGATIVE Negative
mm Induration: 0 mm (ref 0–5)
mm Induration: 0 mm (ref 0–5)

## 2020-03-08 NOTE — Progress Notes (Signed)
PPD negative-0 mm. 

## 2020-03-08 NOTE — Progress Notes (Signed)
PPD negative. 0 mm.

## 2020-04-01 ENCOUNTER — Ambulatory Visit: Payer: Medicare Other

## 2020-04-08 ENCOUNTER — Ambulatory Visit: Payer: Medicare Other

## 2020-04-09 ENCOUNTER — Ambulatory Visit: Payer: Medicare Other | Attending: Internal Medicine

## 2020-04-09 DIAGNOSIS — Z23 Encounter for immunization: Secondary | ICD-10-CM

## 2020-04-09 NOTE — Telephone Encounter (Signed)
Patient is asking for Dr. Madelyn Brunner to recommend a GYN.

## 2020-04-09 NOTE — Progress Notes (Signed)
   Covid-19 Vaccination Clinic  Name:  Michelle Chavez    MRN: [id number] DOB: 1951-09-14  04/09/2020  [name] was observed post Covid-19 immunization for 15 minutes without incident. She was provided with Vaccine Information Sheet and instruction to access the V-Safe system.   [name] was instructed to call 911 with any severe reactions post vaccine: [name] Difficulty breathing  . Swelling of face and throat  . A fast heartbeat  . A bad rash all over body  . Dizziness and weakness   Immunizations Administered    Name Date Dose VIS Date Route   Pfizer COVID-19 Vaccine 04/09/2020 11:22 AM 0.3 mL 01/31/2019 Intramuscular   Manufacturer: [employer], [employer]   Lot: [id number]   NDC: [address]-1

## 2020-04-10 NOTE — Telephone Encounter (Signed)
Left message for patient to call the office.    GYN Phone Numbers:    [facility] [phone]  [place] [address]      [facility] [phone]  [address] [place] [address]    Patient can also check with insurance to see what providers participate with her insurance.                   Letter mailed to patient.

## 2020-04-24 NOTE — Interval H&P Note (Signed)
PREOPERATIVE INSTRUCTIONS  Please Read Carefully    [x]  Your procedure is scheduled on: 04/25/2020    [x]  The day before your surgery, call the surgeon's office to check on the surgery time and the time you should arrive.     [x]  On the day of your surgery, arrive at the time given to you by your surgeon and check in at the first floor registration desk.    [x]  DO NOT eat or drink anything after midnight before your surgery. This includes gum, mints or hard candy.    [x]  You may brush your teeth the morning of surgery, however DO NOT swallow any water.    []  No smoking after midnight.  Smoking should be reduced a few days prior to surgery.    [x]  If you are having an outpatient procedure, you must have a responsible adult bring you to the hospital.  They must remain in the surgical waiting area for the duration of your procedure and provide you with transportation home.  You may not drive yourself home.  We also recommend that you arrange for a responsible person to stay with you for 24 hours following your procedure.  Failure to comply with these instructions may result in cancellation of the procedure.    []  A parent or legal guardian must accompany minors to the hospital.  LEGAL GUARDIANS MUST bring custody papers with them the day of surgery.    []  If the lab gives you a blue blood ID band, do not throw it away.  Bring it with you on the day of surgery.      []  Please return to preop surgical testing no more than 14 days before surgery date to have your type and screen done prior to surgery.    [x]  If you have been diagnosed with Sleep Apnea and are using a CPAP, BiPAP, and/or Bi-Flex machine, please bring it with you the day of surgery.    [x]  Endoscopy patients, please follow the instructions provided by the doctors office.    []  If crutches are ordered, you must get them and be instructed on proper use before the day of surgery.  Please bring them with you the day of surgery.      PREPARING THE SKIN  BEFORE SURGERY Can Reduce the Risk of Infection  []  You have been given a Chlorhexidine skin preparation packet with instructions to bathe the night before surgery and the morning of surgery prior to arrival.    []  If allergic to Chlorhexidine, use Dial (antibacterial) soap to bathe your skin the night before surgery and the morning of surgery.    []  Do not shave your face, underarms, legs or any part of your body at least 48 hours before surgery.  Any clipping needed for surgery will be done at the hospital.      PREPARING TO COME TO THE HOSPITAL  [x]  Wear casual, loose fitting comfortable clothes the day of surgery.    [x]  Remove ALL jewelry, including body piercings.  Leave these and all valuables at home.  Leave suitcases and/or overnight bag at home or in the car for a family member to bring when you get a room.    [x]  DO NOT wear any makeup, nail polish, deodorant, body lotion, aftershave or contact lenses the day of surgery.  Please bring your glasses and glasses case with you the day of surgery.    [x]  Bring any additional paperwork, such as doctors orders, consent form, POA (  power of attorney), and/or advanced directives with you the day of surgery.    [x]  Please notify your surgeon of any changes in your condition such as fever, sore throat or rash as soon as possible before your surgery date.  After office hours, call your surgeon's answering service.    [x] Bring picture identification and insurance cards with you the day of surgery.      MEDICATIONS:  [x]  Stop taking aspirin and aspirin products/NSAIDs (non-steroidal anti-inflammatory drugs such as Motrin, Aleve, Advil, ibuprofen and BC Powder), vitamins and herbal pills 2 weeks before surgery OR as instructed by your doctor.  However, if you take a daily aspirin, please contact your primary care provider (PCP), for instructions prior to surgery.    []  Contact your doctor regarding any blood thinner medications you take (such as Coumadin, Plavix, etc.)  for instructions about what to do prior to surgery.    [] If you have diabetes, hold oral diabetic medications the night before surgery and the morning of surgery.  Hold insulin the morning of surgery unless told otherwise by your doctor.    [x]  If you have asthma or use inhalers please bring them the day of surgery.    [x] Take the following medicine with a sip of water the day of surgery: Carvedilol and Levothyroxine    Medication Documentation Review Audit     Reviewed by Lindwood Qua, RN (Registered Nurse) on 04/24/20 at 1338    Medication Sig Documenting Provider Last Dose Status Taking?   albuterol (PROVENTIL HFA, VENTOLIN HFA, PROAIR HFA) 90 mcg/actuation inhaler Take 2 Puffs by inhalation every four (4) hours as needed for Wheezing. Marvia Pickles, MD  Active    atorvastatin (LIPITOR) 40 mg tablet Take 1 Tab by mouth daily. Marvia Pickles, MD  Active    carvediloL (COREG) 6.25 mg tablet Take 1 Tab by mouth two (2) times daily (with meals). Venda Rodes, MD  Active    Cholecalciferol, Vitamin D3, (VITAMIN D-3) 5,000 unit Tab Take  by mouth daily. Provider, Historical  Active            Med Note Trisha Mangle, KRISIA A   Mon May 09, 2018  2:53 PM)     fluticasone-umeclidin-vilanter (Trelegy Ellipta) 200-62.5-25 mcg dsdv Take 1 Puff by inhalation daily. Dennie Bible, DO  Active    gabapentin (NEURONTIN) 300 mg capsule Take 1 Cap by mouth every morning AND 2 Caps nightly. Max Daily Amount: 900 mg. Marvia Pickles, MD  Active    latanoprost (XALATAN) 0.005 % ophthalmic solution Administer 1 Drop to both eyes nightly. Provider, Historical  Active            Med Note (NESTOR, LUCILLE Q.   Fri Jun 23, 2019  9:34 AM)     levothyroxine (SYNTHROID) 150 mcg tablet Take 1 Tab by mouth Daily (before breakfast). Marvia Pickles, MD  Active    montelukast (SINGULAIR) 10 mg tablet take 1 tablet by mouth at bedtime if needed Hunte, Toniann Fail, MD  Active    multivitamin (ONE A DAY) tablet Take 1 Tab by mouth daily. Provider,  Historical  Active    omeprazole (PRILOSEC) 20 mg capsule TAKE 1 CAP BY MOUTH TWO (2) TIMES A DAY. Marvia Pickles, MD  Active    OTHER 1 Tab daily. Align Probiotic Provider, Historical  Active    potassium chloride (KLOR-CON) 10 mEq tablet take 1 tablet by mouth once daily Black, Talmage Nap, NP  Active  terbinafine HCL (LAMISIL) 250 mg tablet TAKE 1 TABLET EVERY DAY Hunte, Toniann Fail, MD  Active    valsartan-hydroCHLOROthiazide (DIOVAN-HCT) 160-25 mg per tablet Take 1 Tab by mouth daily. Marvia Pickles, MD  Active    vit B complx C/folic acid/zinc (VIT B COMPLEX-C-FOLIC AC-ZINC PO) Take 1 Tab by mouth daily. Provider, Historical  Active                  *Visitor Policy-One visitor per patient, must be 76 years of age or older, must wear a mask. Upon check in, the patient must arrive with their post procedure transportation home. The post-procedure transporting party must remain within the surgical waiting room for the duration of the procedure. Failure may result in the cancellation of the procedure*      We want you to have a positive experience at [facility].  If any of these these instructions are not met, it is possible that your surgery will be canceled.  If you have any questions regarding your surgery, please call [facility] at [phone] or your surgeon's office for further assistance.

## 2020-04-24 NOTE — Other (Signed)
PREOPERATIVE INSTRUCTIONS  Please Read Carefully    _0  Your procedure is scheduled on: 04/25/2020    _1  The day before your surgery, call the surgeon's office to check on the surgery time and the time you should arrive.     _2  On the day of your surgery, arrive at the time given to you by your surgeon and check in at the first floor registration desk.    _3  DO NOT eat or drink anything after midnight before your surgery. This includes gum, mints or hard candy.    _4  You may brush your teeth the morning of surgery, however DO NOT swallow any water.    _5  No smoking after midnight.  Smoking should be reduced a few days prior to surgery.    _6  If you are having an outpatient procedure, you must have a responsible adult bring you to the hospital.  They must remain in the surgical waiting area for the duration of your procedure and provide you with transportation home.  You may not drive yourself home.  We also recommend that you arrange for a responsible person to stay with you for 24 hours following your procedure.  Failure to comply with these instructions may result in cancellation of the procedure.    _7  A parent or legal guardian must accompany minors to the hospital.  LEGAL GUARDIANS MUST bring custody papers with them the day of surgery.    _8  If the lab gives you a blue blood ID band, do not throw it away.  Bring it with you on the day of surgery.      _9  Please return to preop surgical testing no more than 14 days before surgery date to have your type and screen done prior to surgery.    _10  If you have been diagnosed with Sleep Apnea and are using a CPAP, BiPAP, and/or Bi-Flex machine, please bring it with you the day of surgery.    _11  Endoscopy patients, please follow the instructions provided by the doctors office.    _12  If crutches are ordered, you must get them and be instructed on proper use before the day of surgery.  Please bring them with you the day of surgery.      PREPARING THE SKIN  BEFORE SURGERY Can Reduce the Risk of Infection  _13  You have been given a Chlorhexidine skin preparation packet with instructions to bathe the night before surgery and the morning of surgery prior to arrival.    _14  If allergic to Chlorhexidine, use Dial (antibacterial) soap to bathe your skin the night before surgery and the morning of surgery.    _15  Do not shave your face, underarms, legs or any part of your body at least 48 hours before surgery.  Any clipping needed for surgery will be done at the hospital.      [place]  _16  Wear casual, loose fitting comfortable clothes the day of surgery.    _17  Remove ALL jewelry, including body piercings.  Leave these and all valuables at home.  Leave suitcases and/or overnight bag at home or in the car for a family member to bring when you get a room.    _18  DO NOT wear any makeup, nail polish, deodorant, body lotion, aftershave or contact lenses the day of surgery.  Please bring your glasses and glasses case with you the day of surgery.    _19  Bring any additional paperwork, such as doctors orders, consent form, POA (  power of attorney), and/or advanced directives with you the day of surgery.    [x] Please notify your surgeon of any changes in your condition such as fever, sore throat or rash as soon as possible before your surgery date.  After office hours, call your surgeon's answering service.    [x]Bring picture identification and insurance cards with you the day of surgery.      MEDICATIONS:  [x] Stop taking aspirin and aspirin products/NSAIDs (non-steroidal anti-inflammatory drugs such as Motrin, Aleve, Advil, ibuprofen and BC Powder), vitamins and herbal pills 2 weeks before surgery OR as instructed by your doctor.  However, if you take a daily aspirin, please contact your primary care provider (PCP), for instructions prior to surgery.    [] Contact your doctor regarding any blood thinner medications you take (such as Coumadin, Plavix, etc.)  for instructions about what to do prior to surgery.    []If you have diabetes, hold oral diabetic medications the night before surgery and the morning of surgery.  Hold insulin the morning of surgery unless told otherwise by your doctor.    [x] If you have asthma or use inhalers please bring them the day of surgery.    [x]Take the following medicine with a sip of water the day of surgery: Carvedilol and Levothyroxine    Medication Documentation Review Audit     Reviewed by Tressia Danas, RN (Registered Nurse) on 04/24/20 at 1338    Medication Sig Documenting Provider Last Dose Status Taking?   albuterol (PROVENTIL HFA, VENTOLIN HFA, PROAIR HFA) 90 mcg/actuation inhaler Take 2 Puffs by inhalation every four (4) hours as needed for Wheezing. Thana Farr, MD  Active    atorvastatin (LIPITOR) 40 mg tablet Take 1 Tab by mouth daily. Thana Farr, MD  Active    carvediloL (COREG) 6.25 mg tablet Take 1 Tab by mouth two (2) times daily (with meals). Carlyle Dolly, MD  Active    Cholecalciferol, Vitamin D3, (VITAMIN D-3) 5,000 unit Tab Take  by mouth daily. Provider, Historical  Active            Med Note Ferrel Logan, KRISIA A   Mon May 09, 2018  2:53 PM)     fluticasone-umeclidin-vilanter (Trelegy Ellipta) 200-62.5-25 mcg dsdv Take 1 Puff by inhalation daily. Melissa Montane, DO  Active    gabapentin (NEURONTIN) 300 mg capsule Take 1 Cap by mouth every morning AND 2 Caps nightly. Max Daily Amount: 900 mg. Thana Farr, MD  Active    latanoprost (XALATAN) 0.005 % ophthalmic solution Administer 1 Drop to both eyes nightly. Provider, Historical  Active            Med Note (NESTOR, LUCILLE Q.   Fri Jun 23, 2019  9:34 AM)     levothyroxine (SYNTHROID) 150 mcg tablet Take 1 Tab by mouth Daily (before breakfast). Thana Farr, MD  Active    montelukast (SINGULAIR) 10 mg tablet take 1 tablet by mouth at bedtime if needed Hunte, Ninfa Meeker, MD  Active    multivitamin (ONE A DAY) tablet Take 1 Tab by mouth daily. Provider,  Historical  Active    omeprazole (PRILOSEC) 20 mg capsule TAKE 1 CAP BY MOUTH TWO (2) TIMES A DAY. Thana Farr, MD  Active    OTHER 1 Tab daily. Align Probiotic Provider, Historical  Active    potassium chloride (KLOR-CON) 10 mEq tablet take 1 tablet by mouth once daily Black, Wallene Huh, NP  Active  terbinafine HCL (LAMISIL) 250 mg tablet TAKE 1 TABLET EVERY DAY Hunte, Ninfa Meeker, MD  Active    valsartan-hydroCHLOROthiazide (DIOVAN-HCT) 160-25 mg per tablet Take 1 Tab by mouth daily. Thana Farr, MD  Active    vit B complx C/folic acid/zinc (VIT B COMPLEX-C-FOLIC AC-ZINC PO) Take 1 Tab by mouth daily. Provider, Historical  Active                  *Visitor Policy-One visitor per patient, must be 22 years of age or older, must wear a mask. Upon check in, the patient must arrive with their post procedure transportation home. The post-procedure transporting party must remain within the surgical waiting room for the duration of the procedure. Failure may result in the cancellation of the procedure*      We want you to have a positive experience at [facility].  If any of these these instructions are not met, it is possible that your surgery will be canceled.  If you have any questions regarding your surgery, please call [facility] at [phone] or your surgeon's office for further assistance.

## 2020-04-25 ENCOUNTER — Inpatient Hospital Stay: Payer: MEDICARE

## 2020-04-25 MED ORDER — PROPOFOL 10 MG/ML IV EMUL
10 mg/mL | INTRAVENOUS | Status: DC | PRN
Start: 2020-04-25 — End: 2020-04-25
  Administered 2020-04-25 (×2): via INTRAVENOUS

## 2020-04-25 MED ORDER — LACTATED RINGERS IV
INTRAVENOUS | Status: DC
Start: 2020-04-25 — End: 2020-04-25
  Administered 2020-04-25: 17:00:00 via INTRAVENOUS

## 2020-04-25 MED ORDER — LACTATED RINGERS IV
INTRAVENOUS | Status: DC | PRN
Start: 2020-04-25 — End: 2020-04-25
  Administered 2020-04-25: 17:00:00 via INTRAVENOUS

## 2020-04-25 MED ORDER — PROPOFOL 10 MG/ML IV EMUL
10 mg/mL | INTRAVENOUS | Status: DC | PRN
Start: 2020-04-25 — End: 2020-04-25
  Administered 2020-04-25: 17:00:00 via INTRAVENOUS

## 2020-04-25 MED ORDER — LIDOCAINE (PF) 20 MG/ML (2 %) IJ SOLN
20 mg/mL (2 %) | INTRAMUSCULAR | Status: DC | PRN
Start: 2020-04-25 — End: 2020-04-25
  Administered 2020-04-25: 17:00:00 via INTRAVENOUS

## 2020-04-25 MED ORDER — GLYCOPYRROLATE 0.2 MG/ML IJ SOLN
0.2 mg/mL | INTRAMUSCULAR | Status: DC | PRN
Start: 2020-04-25 — End: 2020-04-25
  Administered 2020-04-25: 17:00:00 via INTRAVENOUS

## 2020-04-25 MED FILL — LACTATED RINGERS IV: INTRAVENOUS | Qty: 1000

## 2020-04-25 NOTE — H&P (Signed)
H&P by Sloan Leiter,  MD at 04/25/20 1316                Author: Sloan Leiter, MD  Service: Gastroenterology  Author Type: Physician       Filed: 04/25/20 1317  Date of Service: 04/25/20 1316  Status: Signed          Editor: Sloan Leiter, MD (Physician)                          History and Physical      Elaine [name]      09/06/1951   [id number]      [id number]      Pre-Procedure Diagnosis:  (Z12.11) SCREENING   Patient being seen by Sloan Leiter, MD for: colonoscopy   Chief Complaint: No chief complaint on file.          Evaluation of past illnesses, surgeries, or injuries:   YES     Past Medical History:        Diagnosis  Date         ?  Anemia       ?  Asthma       ?  Colon polyp       ?  DDD (degenerative disc disease)  02/15/2009     ?  Depression  12/18/2011     ?  Essential hypertension, benign       ?  History of echocardiogram  03/20/2004          EF 70%.  Borderline DDfx.  No significant valvular pathology.         ?  Hypercholesterolemia       ?  Hypertension       ?  Iron deficiency anemia  02/15/2009     ?  Lichen planus  99991111     ?  Lower extremity venous duplex  10/23/2009          Left leg:  No DVT.           ?  Meralgia paraesthetica  10/22/2013     ?  OA (osteoarthritis of spine)  02/15/2009     ?  OA (osteoarthritis of spine)  02/15/2009     ?  Obesity, unspecified       ?  Other and unspecified hyperlipidemia       ?  Pre-operative cardiovascular examination            For spine surgery         ?  Right sided sciatica       ?  Shortness of breath            Possible asthma, HCVD; less likely CAD (Noted 03/15/09)         ?  Sleep apnea  02/15/2009          uses cpap machine         ?  Thallium stress test   03/20/2004          Partially transient, mod basal & mid anterior defect most c/w artifact; mild anterior ischemia less likely.  Neg EKG on pharm stress test.         ?  Thyroid disease            hypothyroidism          Past Surgical History:         Procedure  Laterality  Date           ?  HX COLONOSCOPY    02-24-12          normal (Dr. Angelique Holm)          ?  HX HERNIA REPAIR              3x          ?  HX HYSTERECTOMY         ?  HX TUBAL LIGATION         ?  NEUROLOGICAL PROCEDURE UNLISTED    03-16-2009          s/p ACD & Fusion (Dr.P.Gurtner)          ?  PR COLONOSCOPY FLX DX W/COLLJ SPEC WHEN PFRMD    02-2007          +polyp(tubular adenoma); Dr Angelique Holm           Allergies:       Allergies        Allergen  Reactions         ?  Aspirin  Other (comments)             GI distress,          ?  Asa-Acetaminophen-Caff-Potass  Other (comments)             Bleeding in stomach         ?  Benadryl [Diphenhydramine Hcl]  Other (comments)             Muscle jerking         ?  Other Plant, Higher education careers adviser, Environmental  Not Reported This Time             Grass, Dust and Mold         ?  Pollen Extracts  Not Reported This Time           Previous reactions to sedation/analgesia?  NO      Review of current medications, supplement, herbals and nutraceuticals complete:  YES     Current Facility-Administered Medications             Medication  Dose  Route  Frequency  Provider  Last Rate  Last Admin              ?  lactated Ringers infusion   25 mL/hr  IntraVENous  CONTINUOUS  Sloan Leiter, MD  25 mL/hr at 04/25/20 1259  25 mL/hr at 04/25/20 1259               Pertinent labs reviewed?  YES      History of substance abuse?  NO     Family History         Problem  Relation  Age of Onset          ?  Cancer  Mother       ?  Hypertension  Mother       ?  Hypertension  Sister                x3          ?  Heart Surgery  Sister       ?  Heart Disease  Sister       ?  Heart Attack  Sister       ?  Cancer  Maternal Aunt                breast          ?  Breast Cancer  Maternal Aunt       ?  Glaucoma  Son       ?  Heart defect  Son       ?  Diabetes  Paternal Uncle            ?  Hypertension  Paternal Uncle            Social History          Socioeconomic History         ?  Marital status:  DIVORCED              Spouse name:  Not on file          ?  Number of children:  Not on file     ?  Years of education:  Not on file     ?  Highest education level:  Not on file       Occupational History        ?  Not on file       Tobacco Use         ?  Smoking status:  Former Smoker              Packs/day:  1.00         Years:  28.00         Pack years:  28.00         Start date:  10/16/1977         Quit date:  04/25/2004         Years since quitting:  16.0         ?  Smokeless tobacco:  Never Used       Substance and Sexual Activity         ?  Alcohol use:  No     ?  Drug use:  No     ?  Sexual activity:  Not on file        Other Topics  Concern        ?  Not on file       Social History Narrative        ?  Not on file          Social Determinants of Health          Financial Resource Strain:         ?  Difficulty of Paying Living Expenses:        Food Insecurity:         ?  Worried About Charity fundraiser in the Last Year:      ?  Arboriculturist in the Last Year:        Transportation Needs:         ?  Film/video editor (Medical):      ?  Lack of Transportation (Non-Medical):        Physical Activity:         ?  Days of Exercise per Week:      ?  Minutes of Exercise per Session:        Stress:         ?  Feeling of Stress :        Social Connections:         ?  Frequency of Communication with Friends and Family:      ?  Frequency of Social Gatherings with Friends and Family:      ?  Attends Religious Services:      ?  Active Member of Clubs or Organizations:      ?  Attends Archivist Meetings:      ?  Marital Status:        Intimate Partner Violence:         ?  Fear of Current or Ex-Partner:      ?  Emotionally Abused:      ?  Physically Abused:         ?  Sexually Abused:            Review of Systems:       Cardiac Status:  WNL   Mental Status:  WNL    Pulmonary Status:  WNL   NPO:  >4      Physical exam normal female appearance      Assessment/Impression: Hx of colon polyps      Plan of treatment: colonoscopy            Sloan Leiter, MD    04/25/2020   1:16 PM

## 2020-04-25 NOTE — Anesthesia Pre-Procedure Evaluation (Signed)
Relevant Problems   RESPIRATORY SYSTEM   (+) Asthma   (+) DOE (dyspnea on exertion)      NEUROLOGY   (+) Depression      CARDIOVASCULAR   (+) Chronic diastolic congestive heart failure (HCC)   (+) HTN (hypertension), benign      GASTROINTESTINAL   (+) GERD (gastroesophageal reflux disease)      ENDOCRINE   (+) Hypothyroidism   (+) Severe obesity (BMI 35.0-39.9) with comorbidity (HCC)      HEMATOLOGY   (+) Iron deficiency anemia   (+) Submental lymphadenopathy       Anesthetic History   No history of anesthetic complications            Review of Systems / Medical History  Patient summary reviewed, nursing notes reviewed and pertinent labs reviewed    Pulmonary        Sleep apnea: CPAP  Shortness of breath  Asthma        Neuro/Psych         Psychiatric history     Cardiovascular    Hypertension          Hyperlipidemia    Exercise tolerance: <4 METS  Comments: Diastolic CHF   GI/Hepatic/Renal     GERD           Endo/Other      Hypothyroidism  Obesity and arthritis  Pertinent negatives: No morbid obesity   Other Findings   Comments: Chiari I malformation  H/o carcinoid tumor            Physical Exam    Airway  Mallampati: II  TM Distance: 4 - 6 cm  Neck ROM: normal range of motion   Mouth opening: Normal     Cardiovascular    Rhythm: regular  Rate: normal         Dental    Dentition: Full lower dentures and Full upper dentures     Pulmonary  Breath sounds clear to auscultation               Abdominal  GI exam deferred       Other Findings            Anesthetic Plan    ASA: 3  Anesthesia type: general and total IV anesthesia            Anesthetic plan and risks discussed with: Patient

## 2020-04-25 NOTE — H&P (Signed)
History and Physical    Elaine [name]      1951-01-16  [id number]      [id number]    Pre-Procedure Diagnosis:  (Z12.11) SCREENING  Patient being seen by Sloan Leiter, MD for: colonoscopy  Chief Complaint: No chief complaint on file.       Evaluation of past illnesses, surgeries, or injuries:   YES  Past Medical History:   Diagnosis Date   ??? Anemia    ??? Asthma    ??? Colon polyp    ??? DDD (degenerative disc disease) 02/15/2009   ??? Depression 12/18/2011   ??? Essential hypertension, benign    ??? History of echocardiogram 03/20/2004    EF 70%.  Borderline DDfx.  No significant valvular pathology.   ??? Hypercholesterolemia    ??? Hypertension    ??? Iron deficiency anemia [id number]   ??? 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 Lower extremity venous duplex 10/23/2009    Left leg:  No DVT.     ??? Meralgia paraesthetica 10/22/2013   ??? OA (osteoarthritis of spine) 02/15/2009   ??? OA (osteoarthritis of spine) 02/15/2009   ??? Obesity, unspecified    ??? Other and unspecified hyperlipidemia    ??? Pre-operative cardiovascular examination     For spine surgery   ??? Right sided sciatica    ??? Shortness of breath     Possible asthma, HCVD; less likely CAD (Noted 03/15/09)   ??? Sleep apnea 02/15/2009    uses cpap machine   ??? Thallium stress test  03/20/2004    Partially transient, mod basal & mid anterior defect most c/w artifact; mild anterior ischemia less likely.  Neg EKG on pharm stress test.   ??? Thyroid disease     hypothyroidism     Past Surgical History:   Procedure Laterality Date   ??? HX COLONOSCOPY  02-24-12    normal (Dr. Angelique Holm)   ??? HX HERNIA REPAIR      3x   ??? HX HYSTERECTOMY     ??? HX TUBAL LIGATION     ??? NEUROLOGICAL PROCEDURE UNLISTED  03-16-2009    s/p ACD & Fusion (Dr.P.Gurtner)   ??? PR COLONOSCOPY FLX DX W/COLLJ SPEC WHEN PFRMD  02-2007    +polyp(tubular adenoma); Dr Angelique Holm       Allergies:    Allergies   Allergen Reactions   ??? Aspirin Other (comments)     GI distress,    ??? Asa-Acetaminophen-Caff-Potass Other (comments)     Bleeding in stomach   ???  Benadryl [Diphenhydramine Hcl] Other (comments)     Muscle jerking   ??? Other Plant, Animal, Environmental Not Reported This Time     Grass, Dust and Mold   ??? Pollen Extracts Not Reported This Time       Previous reactions to sedation/analgesia?  NO    Review of current medications, supplement, herbals and nutraceuticals complete:  YES  Current Facility-Administered Medications   Medication Dose Route Frequency Provider Last Rate Last Admin   ??? lactated Ringers infusion  25 mL/hr IntraVENous CONTINUOUS Sloan Leiter, MD 25 mL/hr at 04/25/20 1259 25 mL/hr at 04/25/20 1259          Pertinent labs reviewed?  YES    History of substance abuse?  NO  Family History   Problem Relation Age of Onset   ??? Cancer Mother    ??? Hypertension Mother    ??? Hypertension Sister         x3   ???  Heart Surgery Sister    ??? Heart Disease Sister    ??? Heart Attack Sister    ??? Cancer Maternal Aunt         breast   ??? Breast Cancer Maternal Aunt    ??? Glaucoma Son    ??? Heart defect Son    ??? Diabetes Paternal Uncle    ??? Hypertension Paternal Uncle      Social History     Socioeconomic History   ??? Marital status: DIVORCED     Spouse name: Not on file   ??? Number of children: Not on file   ??? Years of education: Not on file   ??? Highest education level: Not on file   Occupational History   ??? Not on file   Tobacco Use   ??? Smoking status: Former Smoker     Packs/day: 1.00     Years: 28.00     Pack years: 28.00     Start date: 10/16/1977     Quit date: 04/25/2004     Years since quitting: 16.0   ??? Smokeless tobacco: Never Used   Substance and Sexual Activity   ??? Alcohol use: No   ??? Drug use: No   ??? Sexual activity: Not on file   Other Topics Concern   ??? Not on file   Social History Narrative   ??? Not on file     Social Determinants of Health     Financial Resource Strain:    ??? Difficulty of Paying Living Expenses:    Food Insecurity:    ??? Worried About Charity fundraiser in the Last Year:    ??? Arboriculturist in the Last Year:    Transportation Needs:    ???  Film/video editor (Medical):    ??? Lack of Transportation (Non-Medical):    Physical Activity:    ??? Days of Exercise per Week:    ??? Minutes of Exercise per Session:    Stress:    ??? Feeling of Stress :    Social Connections:    ??? Frequency of Communication with Friends and Family:    ??? Frequency of Social Gatherings with Friends and Family:    ??? Attends Religious Services:    ??? Marine scientist or Organizations:    ??? Attends Music therapist:    ??? Marital Status:    Intimate Production manager Violence:    ??? Fear of Current or Ex-Partner:    ??? Emotionally Abused:    ??? Physically Abused:    ??? Sexually Abused:        Review of Systems:     Cardiac Status:  WNL  Mental Status:  WNL   Pulmonary Status:  WNL  NPO:  >4    Physical exam normal female appearance    Assessment/Impression: Hx of colon polyps    Plan of treatment: colonoscopy        Sloan Leiter, MD  04/25/2020  1:16 PM

## 2020-04-26 NOTE — Anesthesia Post-Procedure Evaluation (Signed)
Procedure(s):  SCREENING COLONOSCOPY.    general, total IV anesthesia    Anesthesia Post Evaluation      Multimodal analgesia: multimodal analgesia not used between 6 hours prior to anesthesia start to PACU discharge  Patient location during evaluation: PACU  Patient participation: complete - patient participated  Level of consciousness: awake  Pain management: satisfactory to patient  Airway patency: patent  Anesthetic complications: no  Cardiovascular status: stable  Respiratory status: acceptable  Hydration status: balanced  Post anesthesia nausea and vomiting:  none      INITIAL Post-op Vital signs:   Vitals Value Taken Time   BP 142/82 04/25/20 1412   Temp     Pulse 79 04/25/20 1354   Resp 16 04/25/20 1354   SpO2 96 % 04/25/20 1414   Vitals shown include unvalidated device data.

## 2020-05-11 MED ORDER — LEVOTHYROXINE 150 MCG TAB
150 mcg | ORAL_TABLET | ORAL | 1 refills | Status: DC
Start: 2020-05-11 — End: 2020-12-15

## 2020-05-15 ENCOUNTER — Encounter

## 2020-05-15 MED ORDER — POTASSIUM CHLORIDE SR 10 MEQ TAB, PARTICLES/CRYSTALS
10 mEq | ORAL_TABLET | ORAL | 3 refills | Status: DC
Start: 2020-05-15 — End: 2021-11-03

## 2020-05-17 NOTE — Progress Notes (Signed)
no answer-no vm-need to resch 6/17 apt d/t dr Berkley Harvey to tina

## 2020-05-20 NOTE — Progress Notes (Signed)
attempted several times to contact pt to resch 6/17 apt d/t dr out-no answer, no vm-jmd

## 2020-05-21 ENCOUNTER — Encounter

## 2020-05-23 ENCOUNTER — Encounter: Attending: Pulmonary Disease | Primary: Internal Medicine

## 2020-05-26 MED ORDER — TERBINAFINE 250 MG TAB
250 mg | ORAL_TABLET | ORAL | 0 refills | Status: DC
Start: 2020-05-26 — End: 2020-07-17

## 2020-06-11 ENCOUNTER — Encounter

## 2020-06-11 MED ORDER — MONTELUKAST 10 MG TAB
10 mg | ORAL_TABLET | ORAL | 1 refills | Status: DC
Start: 2020-06-11 — End: 2021-01-10

## 2020-06-11 MED ORDER — ATORVASTATIN 40 MG TAB
40 mg | ORAL_TABLET | ORAL | 1 refills | Status: DC
Start: 2020-06-11 — End: 2020-06-12

## 2020-06-11 MED ORDER — ALBUTEROL SULFATE HFA 90 MCG/ACTUATION AEROSOL INHALER
90 mcg/actuation | RESPIRATORY_TRACT | 5 refills | Status: AC | PRN
Start: 2020-06-11 — End: ?

## 2020-06-12 ENCOUNTER — Encounter

## 2020-06-12 MED ORDER — ATORVASTATIN 40 MG TAB
40 mg | ORAL_TABLET | Freq: Every day | ORAL | 0 refills | Status: DC
Start: 2020-06-12 — End: 2020-06-22

## 2020-06-12 NOTE — Telephone Encounter (Signed)
[facility] pharmacy is calling requesting 2 week supply of Atorvastatin be sent to [facility].

## 2020-06-12 NOTE — Telephone Encounter (Signed)
2 weeks supply pended to [facility] to hold patient until [facility] arrives.    Requested Prescriptions     Pending Prescriptions Disp Refills   . atorvastatin (LIPITOR) 40 mg tablet 14 Tablet 0     Sig: Take 1 Tablet by mouth daily.

## 2020-06-22 ENCOUNTER — Encounter

## 2020-06-22 MED ORDER — ATORVASTATIN 40 MG TAB
40 mg | ORAL_TABLET | ORAL | 0 refills | Status: DC
Start: 2020-06-22 — End: 2020-12-15

## 2020-07-17 ENCOUNTER — Encounter

## 2020-07-17 MED ORDER — TERBINAFINE 250 MG TAB
250 mg | ORAL_TABLET | Freq: Every day | ORAL | 0 refills | Status: DC
Start: 2020-07-17 — End: 2021-01-10

## 2020-07-17 NOTE — Telephone Encounter (Signed)
Last Visit: 03/05/20 with MD Hunte  Next Appointment: 09/16/20 with MD Hunte  Previous Refill Encounter(s): 05/25/20 #90    Requested Prescriptions     Pending Prescriptions Disp Refills   . terbinafine HCL (LAMISIL) 250 mg tablet 90 Tablet 0     Sig: Take 1 Tablet by mouth daily.

## 2020-07-18 ENCOUNTER — Ambulatory Visit: Admit: 2020-07-18 | Discharge: 2020-07-18 | Payer: MEDICARE | Attending: Specialist | Primary: Internal Medicine

## 2020-07-18 ENCOUNTER — Encounter

## 2020-07-18 ENCOUNTER — Ambulatory Visit: Attending: Specialist | Primary: Internal Medicine

## 2020-07-18 DIAGNOSIS — I5032 Chronic diastolic (congestive) heart failure: Secondary | ICD-10-CM

## 2020-07-18 MED ORDER — GABAPENTIN 300 MG CAP
300 mg | ORAL_CAPSULE | ORAL | 1 refills | Status: DC
Start: 2020-07-18 — End: 2021-03-18

## 2020-07-18 NOTE — Telephone Encounter (Signed)
 [facility] reports the last fill date for Neurontin  as 05/22/20 for a 90 d/s.     Last Visit: 03/05/20 with MD Hunte  Next Appointment: 09/16/20 with MD Hunte  Previous Refill Encounter(s): 02/26/20 #270 with 1 refill    Requested Prescriptions     Pending Prescriptions Disp Refills   . gabapentin  (NEURONTIN ) 300 mg capsule 270 Capsule 1     Sig: Take 1 Capsule by mouth every morning AND 2 Capsules nightly. Max Daily Amount: 900 mg.

## 2020-07-18 NOTE — Progress Notes (Signed)
HISTORY OF PRESENT ILLNESS  Elaine [name] is a 69 y.o. female.    Follow-up of CHF, hypertension, hyperlipidemia and obesity    8/21 intermittent wheezing and shortness of breath which is improved with inhalers.  05/2018 - Mild SOB today related to asthma - improved with inhaler use.    Hypertension  The history is provided by the medical records. This is a chronic problem. Associated symptoms include shortness of breath.   CHF  The history is provided by the medical records. This is a chronic problem. Associated symptoms include shortness of breath.   Cholesterol Problem  The history is provided by the medical records. This is a chronic problem. Associated symptoms include shortness of breath.   Shortness of Breath  The history is provided by the patient. This is a new problem. The problem occurs intermittently.The current episode started more than 1 week ago (9/17). The problem has not changed since onset.Pertinent negatives include no fever, no cough, no wheezing, no PND, no orthopnea, no vomiting, no rash, no leg swelling and no claudication. The problem's precipitants include exercise (steps in a hurry; ).       Review of Systems   Constitutional: Negative for chills, fever, malaise/fatigue and weight loss.   HENT: Negative for nosebleeds.    Eyes: Negative for discharge.   Respiratory: Positive for shortness of breath. Negative for cough and wheezing.    Cardiovascular: Negative for palpitations, orthopnea, claudication, leg swelling and PND.   Gastrointestinal: Negative for diarrhea, nausea and vomiting.   Genitourinary: Negative for dysuria and hematuria.   Musculoskeletal: Negative for joint pain.   Skin: Negative for rash.   Neurological: Negative for dizziness, seizures and loss of consciousness.   Endo/Heme/Allergies: Negative for polydipsia. Does not bruise/bleed easily.   Psychiatric/Behavioral: Negative for depression and substance abuse. The patient does not have insomnia.      Allergies   Allergen  Reactions   ??? Aspirin Other (comments)     GI distress,    ??? Asa-Acetaminophen-Caff-Potass Other (comments)     Bleeding in stomach   ??? Benadryl [Diphenhydramine Hcl] Other (comments)     Muscle jerking   ??? Other Plant, Animal, Environmental Not Reported This Time     Grass, Dust and Mold   ??? Pollen Extracts Not Reported This Time       Past Medical History:   Diagnosis Date   ??? Anemia    ??? Asthma    ??? Colon polyp    ??? DDD (degenerative disc disease) 02/15/2009   ??? Depression 12/18/2011   ??? Essential hypertension, benign    ??? History of echocardiogram 03/20/2004    EF 70%.  Borderline DDfx.  No significant valvular pathology.   ??? Hypercholesterolemia    ??? Hypertension    ??? Iron deficiency anemia 9/51/8841   ??? 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 Lower extremity venous duplex 10/23/2009    Left leg:  No DVT.     ??? Meralgia paraesthetica 10/22/2013   ??? OA (osteoarthritis of spine) 02/15/2009   ??? OA (osteoarthritis of spine) 02/15/2009   ??? Obesity, unspecified    ??? Other and unspecified hyperlipidemia    ??? Pre-operative cardiovascular examination     For spine surgery   ??? Right sided sciatica    ??? Shortness of breath     Possible asthma, HCVD; less likely CAD (Noted 03/15/09)   ??? Sleep apnea 02/15/2009    uses cpap machine   ??? Thallium stress test  03/20/2004    Partially transient, mod basal & mid anterior defect most c/w artifact; mild anterior ischemia less likely.  Neg EKG on pharm stress test.   ??? Thyroid disease     hypothyroidism       Family History   Problem Relation Age of Onset   ??? Cancer Mother    ??? Hypertension Mother    ??? Hypertension Sister         x3   ??? Heart Surgery Sister    ??? Heart Disease Sister    ??? Heart Attack Sister    ??? Cancer Maternal Aunt         breast   ??? Breast Cancer Maternal Aunt    ??? Glaucoma Son    ??? Heart defect Son    ??? Diabetes Paternal Uncle    ??? Hypertension Paternal Uncle        Social History     Tobacco Use   ??? Smoking status: Former Smoker     Packs/day: 1.00     Years: 28.00     Pack years:  28.00     Start date: 10/16/1977     Quit date: 04/25/2004     Years since quitting: 16.2   ??? Smokeless tobacco: Never Used   Substance Use Topics   ??? Alcohol use: No   ??? Drug use: No        Current Outpatient Medications   Medication Sig   ??? cyanocobalamin (VITAMIN B12) 100 mcg tablet Take 100 mcg by mouth daily.   ??? ZINC PO Take  by mouth.   ??? coffee xt/phosphatidyl serine (NEURIVA ORIGINAL PO) Take  by mouth.   ??? fluticasone propionate (FLOVENT DISKUS) 50 mcg/actuation inhaler Take  by inhalation.   ??? terbinafine HCL (LAMISIL) 250 mg tablet Take 1 Tablet by mouth daily.   ??? atorvastatin (LIPITOR) 40 mg tablet take 1 tablet by mouth once daily   ??? albuterol (PROVENTIL HFA, VENTOLIN HFA, PROAIR HFA) 90 mcg/actuation inhaler TAKE 2 PUFFS BY INHALATION EVERY FOUR (4) HOURS AS NEEDED FOR WHEEZING.   ??? montelukast (SINGULAIR) 10 mg tablet TAKE 1 TABLET EVERY NIGHT AT BEDTIME AS NEEDED   ??? potassium chloride (KLOR-CON) 10 mEq tablet TAKE 1 TABLET EVERY DAY   ??? levothyroxine (SYNTHROID) 150 mcg tablet TAKE 1 TABLET EVERY DAY BEFORE BREAKFAST   ??? gabapentin (NEURONTIN) 300 mg capsule Take 1 Cap by mouth every morning AND 2 Caps nightly. Max Daily Amount: 900 mg.   ??? vit B complx C/folic acid/zinc (VIT B COMPLEX-C-FOLIC AC-ZINC PO) Take 1 Tab by mouth daily.   ??? carvediloL (COREG) 6.25 mg tablet Take 1 Tab by mouth two (2) times daily (with meals).   ??? valsartan-hydroCHLOROthiazide (DIOVAN-HCT) 160-25 mg per tablet Take 1 Tab by mouth daily.   ??? OTHER 1 Tab daily. Align Probiotic   ??? latanoprost (XALATAN) 0.005 % ophthalmic solution Administer 1 Drop to both eyes nightly.   ??? multivitamin (ONE A DAY) tablet Take 1 Tab by mouth daily.   ??? Cholecalciferol, Vitamin D3, (VITAMIN D-3) 5,000 unit Tab Take  by mouth daily.   ??? fluticasone-umeclidin-vilanter (Trelegy Ellipta) 200-62.5-25 mcg dsdv Take 1 Puff by inhalation daily for 14 days.     No current facility-administered medications for this visit.        Past Surgical  History:   Procedure Laterality Date   ??? COLONOSCOPY N/A 04/25/2020    SCREENING COLONOSCOPY performed by Sloan Leiter, MD at [facility]   ???  HX COLONOSCOPY  02-24-12    normal (Dr. Angelique Holm)   ??? HX HERNIA REPAIR      3x   ??? HX HYSTERECTOMY     ??? HX TUBAL LIGATION     ??? NEUROLOGICAL PROCEDURE UNLISTED  03-16-2009    s/p ACD & Fusion (Dr.P.Gurtner)   ??? PR COLONOSCOPY FLX DX W/COLLJ SPEC WHEN PFRMD  02-2007    +polyp(tubular adenoma); Dr Angelique Holm       Visit Vitals  Ht 5\' 2"  (1.575 m)   Wt 84.8 kg (187 lb)   BMI 34.20 kg/m??       Diagnostic Studies:  I have reviewed the relevant tests done on the patient and show as follows  EKG tracings reviewed by me today.      1/18 Nuc Stress  Diagnosis:   1. Probably normal scan.    2. Evidence of a small mild fixed inferior wall defect noted from his nuclear study suggestive of tissue attenuation with normal wall motion in the area.    3. No reversible defect suggesting ischemia noted from his nuclear study.   4. Normal left ventricular size and systolic function.   5. Low risk scan.     1/18 ECHO  SUMMARY:  Left ventricle: Systolic function was hyperdynamic. Ejection fraction was  estimated in the range of 70 % to 75 %. There were no regional wall motion  abnormalities. There was mild concentric hypertrophy. Doppler parameters  were consistent with abnormal left ventricular relaxation (grade 1  diastolic dysfunction).    Mitral valve: There was mild annular calcification.    Elaine [name] has a reminder for a "due or due soon" health maintenance. I have asked that she contact her primary care provider for follow-up on this health maintenance.    Physical Exam  Constitutional:       General: She is not in acute distress.     Appearance: She is well-developed.      Comments: sev obese   HENT:      Head: Normocephalic and atraumatic.      Mouth/Throat:      Dentition: Normal dentition.   Eyes:      General: No scleral icterus.        Right eye: No discharge.         Left eye: No  discharge.   Neck:      Thyroid: No thyromegaly.      Vascular: No carotid bruit or JVD.   Cardiovascular:      Rate and Rhythm: Normal rate and regular rhythm.      Pulses: Intact distal pulses.      Heart sounds: Normal heart sounds, S1 normal and S2 normal. No murmur heard.   No friction rub. No gallop.    Pulmonary:      Effort: Pulmonary effort is normal.      Breath sounds: Normal breath sounds. No wheezing or rales.   Abdominal:      Palpations: Abdomen is soft. There is no mass.      Tenderness: There is no abdominal tenderness.   Musculoskeletal:      Cervical back: Neck supple.   Lymphadenopathy:      Cervical:      Right cervical: No superficial cervical adenopathy.     Left cervical: No superficial cervical adenopathy.   Skin:     General: Skin is warm and dry.      Findings: No rash.   Neurological:      Mental  Status: She is alert and oriented to person, place, and time.   Psychiatric:         Behavior: Behavior normal.       Results for LUMMIE, MONTIJO (MRN [id number]) as of 05/09/2018 15:04   Ref. Range 03/07/2018 17:10   Triglyceride Latest Ref Range: <150 MG/DL 115   Cholesterol, total Latest Ref Range: <200 MG/DL 161   HDL Cholesterol Latest Ref Range: 40 - 60 MG/DL 56   CHOL/HDL Ratio Latest Ref Range: 0 - 5.0   2.9   LDL, calculated Latest Ref Range: 0 - 100 MG/DL 82   VLDL, calculated Latest Units: MG/DL 23     ASSESSMENT and PLAN    HLD : Results for TIFFANI, KADOW (MRN [id number]) as of 06/23/2019 13:04   Ref. Range 10/10/2018 15:55 10/28/2018 10:48 02/21/2019 17:13 06/06/2019 09:38   Triglyceride Latest Ref Range: 0 - 149 mg/dL 90   69   Cholesterol, total Latest Ref Range: 100 - 199 mg/dL 197   175   HDL Cholesterol Latest Ref Range: >39 mg/dL 62 (H)   57   CHOL/HDL Ratio Latest Ref Range: 0 - 5.0   3.2      VLDL, calculated Latest Ref Range: 5 - 40 mg/dL 18   14   LDL, calculated Latest Ref Range: 0 - 99 mg/dL 117 (H)   104 (H)   Results for BERNARD, DONAHOO (MRN [id number]) as of 07/18/2020  09:22   Ref. Range 02/27/2020 08:33   Triglyceride Latest Ref Range: 0 - 149 mg/dL 90   Cholesterol, total Latest Ref Range: 100 - 199 mg/dL 160   HDL Cholesterol Latest Ref Range: >39 mg/dL 57   VLDL, calculated Latest Ref Range: 5 - 40 mg/dL 17   LDL, calculated Latest Ref Range: 0 - 99 mg/dL 86     7/20 Stable CHF.  B/P is controlled.  Continue same medications.  NYHA II.  Weight diet and exercise discussed.      Diagnoses and all orders for this visit:    1. Chronic diastolic congestive heart failure ([place])    2. HTN (hypertension), benign  -     AMB POC EKG ROUTINE W/ 12 LEADS, INTER & REP    3. Pure hypercholesterolemia    4. Obesity (BMI 30.0-34.9)        Pertinent laboratory and test data reviewed and discussed with patient.  See patient instructions also for other medical advice given    Medications Discontinued During This Encounter   Medication Reason   ??? fluticasone-umeclidin-vilanter (Trelegy Ellipta) 557-32.2-02 mcg dsdv DUPLICATE ORDER   ??? omeprazole (PRILOSEC) 20 mg capsule Alternate Therapy       Follow-up and Dispositions    ?? Return in about 1 year (around 07/18/2021), or if symptoms worsen or fail to improve, for with ekg.       07/18/2020 CHF compensated.  Blood pressure is well controlled.  Lipids are better this year than last year.  She is trying to lose weight.  Mediterranean diet guidelines printed.  Exercise and physical therapy for the back encouraged.

## 2020-07-18 NOTE — Progress Notes (Signed)
Patient brought medications list    1. Have you been to the ER, urgent care clinic since your last visit?  Hospitalized since your last visit?     No    2. Have you seen or consulted any other health care providers outside of the [facility] since your last visit?  Include any pap smears or colon screening.      Yes Where: PCP Routine/ Gastrology Colonoscopy/      3.  Since your last visit, have you had any of the following symptoms?      shortness of breath and dizziness.     4.  Have you had any blood work, X-rays or cardiac testing?      No     5.  Where do you normally have your labs drawn?   PCP    6. Do you need any refills today?   No

## 2020-08-12 ENCOUNTER — Encounter

## 2020-08-13 MED ORDER — VALSARTAN-HYDROCHLOROTHIAZIDE 160 MG-25 MG TAB
160-25 mg | ORAL_TABLET | ORAL | 3 refills | Status: DC
Start: 2020-08-13 — End: 2021-03-21

## 2020-09-10 ENCOUNTER — Ambulatory Visit: Payer: MEDICARE | Primary: Internal Medicine

## 2020-09-16 ENCOUNTER — Telehealth

## 2020-09-16 ENCOUNTER — Ambulatory Visit: Payer: MEDICARE | Attending: Internal Medicine | Primary: Internal Medicine

## 2020-09-18 MED ORDER — CARVEDILOL 6.25 MG TAB
6.25 mg | ORAL_TABLET | ORAL | 3 refills | Status: DC
Start: 2020-09-18 — End: 2021-06-02

## 2020-10-10 ENCOUNTER — Ambulatory Visit: Payer: MEDICARE | Attending: Internal Medicine | Primary: Internal Medicine

## 2020-12-02 ENCOUNTER — Encounter

## 2020-12-02 NOTE — Telephone Encounter (Signed)
LMTRC

## 2020-12-02 NOTE — Telephone Encounter (Signed)
Pt needs to schedule labs and OV 1 week after

## 2020-12-03 NOTE — Telephone Encounter (Signed)
Pt has not returned call. Sent letter

## 2020-12-16 MED ORDER — ATORVASTATIN 40 MG TAB
40 mg | ORAL_TABLET | ORAL | 0 refills | Status: DC
Start: 2020-12-16 — End: 2021-03-17

## 2020-12-16 MED ORDER — LEVOTHYROXINE 150 MCG TAB
150 mcg | ORAL_TABLET | ORAL | 0 refills | Status: DC
Start: 2020-12-16 — End: 2021-07-30

## 2020-12-17 ENCOUNTER — Encounter

## 2020-12-26 ENCOUNTER — Inpatient Hospital Stay: Admit: 2020-12-26 | Payer: MEDICARE | Attending: Internal Medicine | Primary: Internal Medicine

## 2020-12-26 DIAGNOSIS — Z1231 Encounter for screening mammogram for malignant neoplasm of breast: Secondary | ICD-10-CM

## 2020-12-31 NOTE — Telephone Encounter (Signed)
[place] on ph # [phone]

## 2020-12-31 NOTE — Telephone Encounter (Signed)
No answer. No voicemail.

## 2020-12-31 NOTE — Telephone Encounter (Signed)
-----   Message from Cindi Carbon sent at 12/30/2020  5:51 PM EST -----  Subject: Message to Provider    QUESTIONS  Information for Provider? Pt has an appt set for 01/08/21 at 8:40. She will   need a lab appt. She is requesting a lab appt for the latest time you have   . Please give pt. a call back  ---------------------------------------------------------------------------  --------------  CALL BACK INFO  What is the best way for the office to contact you? OK to leave message on   voicemail  Preferred Call Back Phone Number? [phone]  ---------------------------------------------------------------------------  --------------  SCRIPT ANSWERS  Relationship to Patient? Self

## 2021-01-03 ENCOUNTER — Ambulatory Visit: Admit: 2021-01-03 | Discharge: 2021-01-03 | Payer: MEDICARE | Primary: Internal Medicine

## 2021-01-04 LAB — COMPREHENSIVE METABOLIC PANEL
ALT: 17 IU/L (ref 0–32)
AST: 25 IU/L (ref 0–40)
Albumin/Globulin Ratio: 1.8 NA (ref 1.2–2.2)
Albumin: 4.4 g/dL (ref 3.8–4.8)
Alkaline Phosphatase: 101 IU/L (ref 44–121)
BUN/Creatinine Ratio: 5 NA — ABNORMAL LOW (ref 12–28)
BUN: 5 mg/dL — ABNORMAL LOW (ref 8–27)
CO2: 25 mmol/L (ref 20–29)
Calcium: 9.4 mg/dL (ref 8.7–10.3)
Chloride: 88 mmol/L — ABNORMAL LOW (ref 96–106)
Creatinine: 0.98 mg/dL (ref 0.57–1.00)
GFR African American: 68 mL/min/{1.73_m2} (ref >59)
Globulin, Total: 2.4 g/dL (ref 1.5–4.5)
Glucose: 86 mg/dL (ref 65–99)
Potassium: 4.2 mmol/L (ref 3.5–5.2)
Sodium: 128 mmol/L — ABNORMAL LOW (ref 134–144)
Total Bilirubin: 0.3 mg/dL (ref 0.0–1.2)
Total Protein: 6.8 g/dL (ref 6.0–8.5)
eGFR NON-AA: 59 mL/min/1.73 — ABNORMAL LOW (ref >59)

## 2021-01-04 LAB — LIPID PANEL
Cholesterol, Total: 141 mg/dL (ref 100–199)
Cholesterol, total: 141 mg/dL (ref 100–199)
HDL Cholesterol: 52 mg/dL (ref >39)
HDL: 52 mg/dL (ref >39)
LDL Calculated: 74 mg/dL (ref 0–99)
LDL, calculated: 74 mg/dL (ref 0–99)
Triglyceride: 76 mg/dL (ref 0–149)
Triglycerides: 76 mg/dL (ref 0–149)
VLDL, calculated: 15 mg/dL (ref 5–40)
VLDL: 15 mg/dL (ref 5–40)

## 2021-01-04 LAB — CKD REPORT

## 2021-01-04 LAB — VITAMIN D 25 HYDROXY: Vit D, 25-Hydroxy: 86.4 ng/mL (ref 30.0–100.0)

## 2021-01-04 LAB — CVD REPORT

## 2021-01-04 LAB — TSH 3RD GENERATION
TSH: 6.16 u[IU]/mL — ABNORMAL HIGH (ref 0.450–4.500)
TSH: 6.16 u[IU]/mL — ABNORMAL HIGH (ref 0.450–4.500)

## 2021-01-04 LAB — METABOLIC PANEL, COMPREHENSIVE
A-G Ratio: 1.8 (ref 1.2–2.2)
ALT (SGPT): 17 IU/L (ref 0–32)
AST (SGOT): 25 IU/L (ref 0–40)
Albumin: 4.4 g/dL (ref 3.8–4.8)
Alk. phosphatase: 101 IU/L (ref 44–121)
BUN/Creatinine ratio: 5 — ABNORMAL LOW (ref 12–28)
BUN: 5 mg/dL — ABNORMAL LOW (ref 8–27)
Bilirubin, total: 0.3 mg/dL (ref 0.0–1.2)
CO2: 25 mmol/L (ref 20–29)
Calcium: 9.4 mg/dL (ref 8.7–10.3)
Chloride: 88 mmol/L — ABNORMAL LOW (ref 96–106)
Creatinine: 0.98 mg/dL (ref 0.57–1.00)
GFR est AA: 68 mL/min/{1.73_m2} (ref >59)
GFR est non-AA: 59 mL/min/{1.73_m2} — ABNORMAL LOW (ref >59)
GLOBULIN, TOTAL: 2.4 g/dL (ref 1.5–4.5)
Glucose: 86 mg/dL (ref 65–99)
Potassium: 4.2 mmol/L (ref 3.5–5.2)
Protein, total: 6.8 g/dL (ref 6.0–8.5)
Sodium: 128 mmol/L — ABNORMAL LOW (ref 134–144)

## 2021-01-04 LAB — VITAMIN D, 25 HYDROXY: VITAMIN D, 25-HYDROXY: 86.4 ng/mL (ref 30.0–100.0)

## 2021-01-08 ENCOUNTER — Ambulatory Visit: Payer: MEDICARE | Attending: Internal Medicine | Primary: Internal Medicine

## 2021-01-08 NOTE — Telephone Encounter (Signed)
Pt missed OV this morning. Her labs show low sodium and dehydration due to the Diovan/Hct (diuretic) she is on. She should hold this medication and reschedule her OV within the next week so labs can be repeated and medications adjusted

## 2021-01-08 NOTE — Telephone Encounter (Signed)
Tried to call patient no voice mail, unable to leave message.   Left message for patient to call the office.

## 2021-01-09 NOTE — Telephone Encounter (Signed)
Tried to call patient on home phone recording states patient not available at this time and disconnects.  Left message of work number for patient to call the office.

## 2021-01-09 NOTE — Telephone Encounter (Signed)
Spoke with patient she is aware labs show low sodium and dehydration due to the Diovan/HCT she is on.  Patient is aware to hold this medication and reschedule office visit with the next week and lab will be rechecked. Patient states she will call back to schedule appointment.

## 2021-01-10 ENCOUNTER — Encounter

## 2021-01-10 MED ORDER — MONTELUKAST 10 MG TAB
10 mg | ORAL_TABLET | ORAL | 2 refills | Status: DC
Start: 2021-01-10 — End: 2021-01-17

## 2021-01-10 MED ORDER — TERBINAFINE 250 MG TAB
250 mg | ORAL_TABLET | Freq: Every day | ORAL | 0 refills | Status: DC
Start: 2021-01-10 — End: 2021-01-17

## 2021-01-10 NOTE — Telephone Encounter (Signed)
Patient is asking to speak with a nurse. She needs some clarification on what she is to be holding.   [phone]

## 2021-01-10 NOTE — Telephone Encounter (Signed)
Patient is aware which medication to hold.

## 2021-01-10 NOTE — Telephone Encounter (Signed)
Patient states she needs a refill sent to [facility].    Last OV  03/05/2020  Next OV 01/17/2021  Last refill     Montelukast 06/14/2020  Terbinafine 07/17/2020

## 2021-01-17 ENCOUNTER — Ambulatory Visit: Admit: 2021-01-17 | Discharge: 2021-01-17 | Payer: MEDICARE | Attending: Internal Medicine | Primary: Internal Medicine

## 2021-01-17 ENCOUNTER — Ambulatory Visit: Attending: Internal Medicine | Primary: Internal Medicine

## 2021-01-17 DIAGNOSIS — I10 Essential (primary) hypertension: Secondary | ICD-10-CM

## 2021-01-17 MED ORDER — MONTELUKAST 10 MG TAB
10 mg | ORAL_TABLET | Freq: Every day | ORAL | 3 refills | Status: DC
Start: 2021-01-17 — End: 2022-01-13

## 2021-01-17 NOTE — Progress Notes (Signed)
Elaine [name] is a 70 y.o.  female and presents with Hypertension (re), Cholesterol Problem, Vitamin D Deficiency (f/u), Thyroid Problem, and CHF      SUBJECTIVE:  [name] HTN is uncontrolled off  DiovanHct 160/25  Which she stopped due to hyponatremia. She continues on Coreg 6.25 mg BID   [name] high cholesterol is well controlled on Lipitor 40 mg. Pt  denies any side effects like myalgias.  Pt has been controlling her prediabetes with diet and weight loss.      Thyroid Review:  Patient is seen for followup of hypothyroidism.   Thyroid ROS: denies fatigue, weight changes, heat/cold intolerance, bowel/skin changes or CVS symptoms. 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  Her TSH levels are now high on Synthroid 150 mcg daily  With missing the weekend dose on Saturday and Sunday.  She will increase the Synthroid 150 mcg to 6 times a week.    [name] vit D level is  high on vit D 5000 units/day. Pt will cu  back to 3x/week    Patient has sleep apnea and is using a CPAP machine and followed by sleep specialist        Patient has asthma that is controlled on Singulair and  Albuterol prn. She does not appear to be taking steroid inhaler on a regular basis.       Patient will continue to work on trying to lose weight and decrease in her BMI from 36 with cutting back starches and sweets and being as active as possible.    Patient has chronic diastolic heart failure which appears to be exacerbated with patient being off Diovan HCT for several days and having shortness of breath with exertion.  Will restart the above medication and see if patient shortness of breath improves.  Patient also has a history of anemia for which he gets iron infusions so we will check CBC to see if levels are unusually low.    Respiratory ROS: negative for - shortness of breath  Cardiovascular ROS: negative for - chest pain    Current Outpatient Medications    Medication Sig   ??? montelukast (Singulair) 10 mg tablet Take 1 Tablet by mouth daily.   ??? levothyroxine (SYNTHROID) 150 mcg tablet TAKE 1 TABLET EVERY DAY BEFORE BREAKFAST   ??? atorvastatin (LIPITOR) 40 mg tablet TAKE 1 TABLET EVERY DAY   ??? carvediloL (COREG) 6.25 mg tablet TAKE 1 TABLET TWICE DAILY WITH MEALS (Patient not taking: Reported on 01/21/2021)   ??? cyanocobalamin (VITAMIN B12) 100 mcg tablet Take 100 mcg by mouth daily.   ??? ZINC PO Take  by mouth.   ??? coffee xt/phosphatidyl serine (NEURIVA ORIGINAL PO) Take  by mouth.   ??? fluticasone propionate (FLOVENT DISKUS) 50 mcg/actuation inhaler Take  by inhalation. (Patient not taking: Reported on 01/21/2021)   ??? gabapentin (NEURONTIN) 300 mg capsule Take 1 Capsule by mouth every morning AND 2 Capsules nightly. Max Daily Amount: 900 mg.   ??? albuterol (PROVENTIL HFA, VENTOLIN HFA, PROAIR HFA) 90 mcg/actuation inhaler TAKE 2 PUFFS BY INHALATION EVERY FOUR (4) HOURS AS NEEDED FOR WHEEZING.   ??? potassium chloride (KLOR-CON) 10 mEq tablet TAKE 1 TABLET EVERY DAY   ??? vit B complx C/folic acid/zinc (VIT B COMPLEX-C-FOLIC AC-ZINC PO) Take 1 Tab by mouth daily.   ??? OTHER 1 Tab daily. Align Probiotic   ??? latanoprost (XALATAN) 0.005 % ophthalmic solution Administer 1 Drop  to both eyes nightly.   ??? multivitamin (ONE A DAY) tablet Take 1 Tab by mouth daily.   ??? Cholecalciferol, Vitamin D3, (VITAMIN D-3) 5,000 unit Tab Take  by mouth daily.   ??? valsartan-hydroCHLOROthiazide (DIOVAN-HCT) 160-25 mg per tablet TAKE 1 TABLET EVERY DAY     No current facility-administered medications for this visit.         OBJECTIVE:  alert, well appearing, and in no distress  Visit Vitals  BP (!) 141/48 (BP 1 Location: Right arm, BP Patient Position: Sitting, BP Cuff Size: Adult)   Pulse 82   Temp 97.7 ??F (36.5 ??C) (Temporal)   Resp 20   Ht _0  (1.575 m)   Wt 197 lb (89.4 kg)   SpO2 98%   BMI 36.03 kg/m??      well developed and well nourished  Chest -scattered rhonchi at both lung bases  Heart -  normal rate, regular rhythm, normal S1, S2, no murmurs, rubs, clicks or gallops  Extremities - peripheral pulses normal, no pedal edema, no clubbing or cyanosis    Labs:   Lab Results   Component Value Date/Time    Cholesterol, total 141 01/03/2021 12:00 AM    HDL Cholesterol 52 01/03/2021 12:00 AM    LDL, calculated 74 01/03/2021 12:00 AM    LDL, calculated 104 (H) 06/06/2019 09:38 AM    Triglyceride 76 01/03/2021 12:00 AM    CHOL/HDL Ratio 3.2 10/10/2018 03:55 PM     Lab Results   Component Value Date/Time    Sodium 141 01/17/2021 12:00 AM    Potassium 3.7 01/17/2021 12:00 AM    Chloride 103 01/17/2021 12:00 AM    CO2 25 01/17/2021 12:00 AM    Anion gap 3 10/10/2018 03:55 PM    Glucose 100 (H) 01/17/2021 12:00 AM    BUN 11 01/17/2021 12:00 AM    Creatinine 0.71 01/17/2021 12:00 AM    BUN/Creatinine ratio 15 01/17/2021 12:00 AM    GFR est AA 100 01/17/2021 12:00 AM    GFR est non-AA 87 01/17/2021 12:00 AM    Calcium 9.0 01/17/2021 12:00 AM    Bilirubin, total 0.3 01/03/2021 12:00 AM    ALT (SGPT) 17 01/03/2021 12:00 AM    Alk. phosphatase 101 01/03/2021 12:00 AM    Protein, total 6.8 01/03/2021 12:00 AM    Albumin 4.4 01/03/2021 12:00 AM    Globulin 3.1 10/10/2018 03:55 PM    A-G Ratio 1.8 01/03/2021 12:00 AM      Lab Results   Component Value Date/Time    Hemoglobin A1c 5.4 02/27/2020 08:33 AM      Labs:   Lab Results   Component Value Date/Time    TSH 6.160 (H) 01/03/2021 12:00 AM    T4, Free 1.3 03/29/2017 05:30 PM          Discussed the patient's BMI with her.  The BMI follow up plan is as follows: I have counseled this patient on diet and exercise regimens.        Assessment/Plan      ICD-10-CM ICD-9-CM    1. HTN (hypertension), benign  I10 401.1  uncontrolled off Diovan HCT .  Will restart Diovan HCT due to increase in shortness of breath and continue on Coreg   2. 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   4. Hyponatremia  E87.1 276.1  due possibly to HCTZ will check to see if improved METABOLIC PANEL, BASIC   5. Iron deficiency anemia due to chronic blood loss  D50.0 280.0  will check CBC WITH AUTOMATED DIFF   6. Acute diastolic congestive heart failure ([facility])  I50.31 428.31  current exacerbation.  Will check NT-PRO BNP patient to restart Diovan HCT to see if any improvement     428.0    7. Vitamin D deficiency  E55.9 268.9  levels high on vitamin D 5000 units/day patient to try and decrease to 3 times a week   8. Severe obesity (BMI 35.0-39.9) with comorbidity ([place])  E66.01 278.01  patient will continue to work on trying to lose weight by cutting back starches and sweets in the diet         Follow-up and Dispositions    ?? Return in about 4 days (around 01/21/2021) for ok to double book .           Reviewed plan of care. Patient has provided input and agrees with goals.

## 2021-01-17 NOTE — Progress Notes (Signed)
Patient is in the office today for a  follow up.  Patient states for the last three days she has been SOB and wanted to know if this would have anything to do with her stopping Valsartan -HCT.    Patient states she will need an alternative for Montelukast.       Do you have an Advance Directive no  Do you want more information : information given     1. "Have you been to the ER, urgent care clinic since your last visit?  Hospitalized since your last visit?" No    2. "Have you seen or consulted any other health care providers outside of the [facility] since your last visit?" No     3. For patients aged 70-75: Has the patient had a colonoscopy / FIT/ Cologuard? NA - based on age      If the patient is female:    4. For patients aged 76-74: Has the patient had a mammogram within the past 2 years? NA - based on age or sex      47. For patients aged 21-65: Has the patient had a pap smear? NA - based on age or sex

## 2021-01-18 LAB — BASIC METABOLIC PANEL
BUN/Creatinine Ratio: 15 NA (ref 12–28)
BUN: 11 mg/dL (ref 8–27)
CO2: 25 mmol/L (ref 20–29)
Calcium: 9 mg/dL (ref 8.7–10.3)
Chloride: 103 mmol/L (ref 96–106)
Creatinine: 0.71 mg/dL (ref 0.57–1.00)
GFR African American: 100 mL/min/{1.73_m2} (ref >59)
Glucose: 100 mg/dL — ABNORMAL HIGH (ref 65–99)
Potassium: 3.7 mmol/L (ref 3.5–5.2)
Sodium: 141 mmol/L (ref 134–144)
eGFR NON-AA: 87 mL/min/1.73 (ref >59)

## 2021-01-18 LAB — CBC WITH AUTO DIFFERENTIAL
Basophils %: 1 %
Basophils Absolute: 0 10*3/uL (ref 0.0–0.2)
Eosinophils %: 2 %
Eosinophils Absolute: 0.1 10*3/uL (ref 0.0–0.4)
Granulocyte Absolute Count: 0 10*3/uL (ref 0.0–0.1)
Hematocrit: 26.4 % — ABNORMAL LOW (ref 34.0–46.6)
Hemoglobin: 8.3 g/dL — ABNORMAL LOW (ref 11.1–15.9)
Immature Granulocytes %: 1 %
Lymphocytes %: 14 %
Lymphocytes Absolute: 0.8 10*3/uL (ref 0.7–3.1)
MCH: 26.3 pg — ABNORMAL LOW (ref 26.6–33.0)
MCHC: 31.4 g/dL — ABNORMAL LOW (ref 31.5–35.7)
MCV: 84 fL (ref 79–97)
Monocytes %: 8 %
Monocytes Absolute: 0.4 10*3/uL (ref 0.1–0.9)
Neutrophils %: 74 %
Neutrophils Absolute: 4.2 10*3/uL (ref 1.4–7.0)
Platelets: 312 10*3/uL (ref 150–450)
RBC: 3.16 x10E6/uL — ABNORMAL LOW (ref 3.77–5.28)
RDW: 13.7 % (ref 11.7–15.4)
WBC: 5.6 10*3/uL (ref 3.4–10.8)

## 2021-01-18 LAB — PROBNP, N-TERMINAL: Pro-BNP: 509 pg/mL — ABNORMAL HIGH (ref 0–301)

## 2021-01-18 LAB — CBC WITH AUTOMATED DIFF
ABS. BASOPHILS: 0 10*3/uL (ref 0.0–0.2)
ABS. EOSINOPHILS: 0.1 10*3/uL (ref 0.0–0.4)
ABS. IMM. GRANS.: 0 10*3/uL (ref 0.0–0.1)
ABS. MONOCYTES: 0.4 10*3/uL (ref 0.1–0.9)
ABS. NEUTROPHILS: 4.2 10*3/uL (ref 1.4–7.0)
Abs Lymphocytes: 0.8 10*3/uL (ref 0.7–3.1)
BASOPHILS: 1 %
EOSINOPHILS: 2 %
HCT: 26.4 % — ABNORMAL LOW (ref 34.0–46.6)
HGB: 8.3 g/dL — ABNORMAL LOW (ref 11.1–15.9)
IMMATURE GRANULOCYTES: 1 %
Lymphocytes: 14 %
MCH: 26.3 pg — ABNORMAL LOW (ref 26.6–33.0)
MCHC: 31.4 g/dL — ABNORMAL LOW (ref 31.5–35.7)
MCV: 84 fL (ref 79–97)
MONOCYTES: 8 %
NEUTROPHILS: 74 %
PLATELET: 312 10*3/uL (ref 150–450)
RBC: 3.16 x10E6/uL — ABNORMAL LOW (ref 3.77–5.28)
RDW: 13.7 % (ref 11.7–15.4)
WBC: 5.6 10*3/uL (ref 3.4–10.8)

## 2021-01-18 LAB — METABOLIC PANEL, BASIC
BUN/Creatinine ratio: 15 (ref 12–28)
BUN: 11 mg/dL (ref 8–27)
CO2: 25 mmol/L (ref 20–29)
Calcium: 9 mg/dL (ref 8.7–10.3)
Chloride: 103 mmol/L (ref 96–106)
Creatinine: 0.71 mg/dL (ref 0.57–1.00)
GFR est AA: 100 mL/min/{1.73_m2} (ref >59)
GFR est non-AA: 87 mL/min/{1.73_m2} (ref >59)
Glucose: 100 mg/dL — ABNORMAL HIGH (ref 65–99)
Potassium: 3.7 mmol/L (ref 3.5–5.2)
Sodium: 141 mmol/L (ref 134–144)

## 2021-01-18 LAB — NT-PRO BNP: PROBNP: 509 pg/mL — ABNORMAL HIGH (ref 0–301)

## 2021-01-21 ENCOUNTER — Ambulatory Visit: Admit: 2021-01-21 | Discharge: 2021-01-21 | Payer: MEDICARE | Attending: Internal Medicine | Primary: Internal Medicine

## 2021-01-21 ENCOUNTER — Ambulatory Visit: Attending: Internal Medicine | Primary: Internal Medicine

## 2021-01-21 DIAGNOSIS — R0600 Dyspnea, unspecified: Secondary | ICD-10-CM

## 2021-01-21 NOTE — Progress Notes (Signed)
 Patient is in the office today for a 4 day follow up.    1. Have you been to the ER, urgent care clinic since your last visit?  Hospitalized since your last visit? No    2. Have you seen or consulted any other health care providers outside of the [facility] since your last visit? No     3. For patients aged 71-75: Has the patient had a colonoscopy / FIT/ Cologuard? NA - based on age      If the patient is female:    4. For patients aged 22-74: Has the patient had a mammogram within the past 2 years? NA - based on age or sex      50. For patients aged 21-65: Has the patient had a pap smear? NA - based on age or sex

## 2021-01-21 NOTE — Progress Notes (Signed)
Elaine [name] is a 70 y.o.  female and presents with Follow-up, Anemia, Shortness of Breath, and Hypertension      SUBJECTIVE:    Patient shortness of breath has improved with restarting Diovan HCT.  Patient does have chronic iron deficiency anemia for which she gets iron infusions every 3 months.  Her hemoglobin is down to 8.3 which may be a factor in her shortness of breath currently.  She still continues to have some mild rhonchi in her lungs and with mildly elevated BNP we will go ahead and check a chest x-ray for further evaluation.  Blood pressure is better controlled with patient restarting Diovan HCT 160/12.5.    Respiratory ROS: negative for - shortness of breath  Cardiovascular ROS: negative for - chest pain    Current Outpatient Medications   Medication Sig   ??? montelukast (Singulair) 10 mg tablet Take 1 Tablet by mouth daily.   ??? levothyroxine (SYNTHROID) 150 mcg tablet TAKE 1 TABLET EVERY DAY BEFORE BREAKFAST   ??? atorvastatin (LIPITOR) 40 mg tablet TAKE 1 TABLET EVERY DAY   ??? valsartan-hydroCHLOROthiazide (DIOVAN-HCT) 160-25 mg per tablet TAKE 1 TABLET EVERY DAY   ??? cyanocobalamin (VITAMIN B12) 100 mcg tablet Take 100 mcg by mouth daily.   ??? ZINC PO Take  by mouth.   ??? coffee xt/phosphatidyl serine (NEURIVA ORIGINAL PO) Take  by mouth.   ??? gabapentin (NEURONTIN) 300 mg capsule Take 1 Capsule by mouth every morning AND 2 Capsules nightly. Max Daily Amount: 900 mg.   ??? albuterol (PROVENTIL HFA, VENTOLIN HFA, PROAIR HFA) 90 mcg/actuation inhaler TAKE 2 PUFFS BY INHALATION EVERY FOUR (4) HOURS AS NEEDED FOR WHEEZING.   ??? potassium chloride (KLOR-CON) 10 mEq tablet TAKE 1 TABLET EVERY DAY   ??? vit B complx C/folic acid/zinc (VIT B COMPLEX-C-FOLIC AC-ZINC PO) Take 1 Tab by mouth daily.   ??? OTHER 1 Tab daily. Align Probiotic   ??? latanoprost (XALATAN) 0.005 % ophthalmic solution Administer 1 Drop to both eyes nightly.   ??? multivitamin (ONE A DAY) tablet Take 1 Tab by mouth daily.   ??? Cholecalciferol, Vitamin  D3, (VITAMIN D-3) 5,000 unit Tab Take  by mouth daily.   ??? carvediloL (COREG) 6.25 mg tablet TAKE 1 TABLET TWICE DAILY WITH MEALS (Patient not taking: Reported on 01/21/2021)   ??? fluticasone propionate (FLOVENT DISKUS) 50 mcg/actuation inhaler Take  by inhalation. (Patient not taking: Reported on 01/21/2021)     No current facility-administered medications for this visit.         OBJECTIVE:  alert, well appearing, and in no distress  Visit Vitals  BP 129/62 (BP 1 Location: Left arm, BP Patient Position: Sitting, BP Cuff Size: Adult)   Pulse 94   Temp 97.6 ??F (36.4 ??C) (Temporal)   Resp 20   Ht 5\' 2"  (1.575 m)   Wt 191 lb (86.6 kg)   SpO2 97%   BMI 34.93 kg/m??      well developed and well nourished  Chest - rhonchi noted at bases bilaterally  Heart - normal rate, regular rhythm, normal S1, S2, no murmurs, rubs, clicks or gallops      Results for orders placed or performed in visit on 01/17/21   CBC WITH AUTOMATED DIFF   Result Value Ref Range    WBC 5.6 3.4 - 10.8 x10E3/uL    RBC 3.16 (L) 3.77 - 5.28 x10E6/uL    HGB 8.3 (L) 11.1 - 15.9 g/dL    HCT 26.4 (L) 34.0 -  46.6 %    MCV 84 79 - 97 fL    MCH 26.3 (L) 26.6 - 33.0 pg    MCHC 31.4 (L) 31.5 - 35.7 g/dL    RDW 13.7 11.7 - 15.4 %    PLATELET 312 150 - 450 x10E3/uL    NEUTROPHILS 74 Not Estab. %    Lymphocytes 14 Not Estab. %    MONOCYTES 8 Not Estab. %    EOSINOPHILS 2 Not Estab. %    BASOPHILS 1 Not Estab. %    ABS. NEUTROPHILS 4.2 1.4 - 7.0 x10E3/uL    Abs Lymphocytes 0.8 0.7 - 3.1 x10E3/uL    ABS. MONOCYTES 0.4 0.1 - 0.9 x10E3/uL    ABS. EOSINOPHILS 0.1 0.0 - 0.4 x10E3/uL    ABS. BASOPHILS 0.0 0.0 - 0.2 x10E3/uL    IMMATURE GRANULOCYTES 1 Not Estab. %    ABS. IMM. GRANS. 0.0 0.0 - 0.1 I34V4/QV   METABOLIC PANEL, BASIC   Result Value Ref Range    Glucose 100 (H) 65 - 99 mg/dL    BUN 11 8 - 27 mg/dL    Creatinine 0.71 0.57 - 1.00 mg/dL    GFR est non-AA 87 >59 mL/min/1.73    GFR est AA 100 >59 mL/min/1.73    BUN/Creatinine ratio 15 12 - 28    Sodium 141 134 - 144  mmol/L    Potassium 3.7 3.5 - 5.2 mmol/L    Chloride 103 96 - 106 mmol/L    CO2 25 20 - 29 mmol/L    Calcium 9.0 8.7 - 10.3 mg/dL   NT-PRO BNP   Result Value Ref Range    PROBNP 509 (H) 0 - 301 pg/mL         Discussed the patient's BMI with her.  The BMI follow up plan is as follows: I have counseled this patient on diet and exercise regimens.        Assessment/Plan      ICD-10-CM ICD-9-CM    1. DOE (dyspnea on exertion)  R06.00 786.09  may be multifactorial with patient's anemia and mild fluid overload.  We will check XR CHEST PA LAT.  Consider cardiac evaluation in the future if symptoms recur.  Patient to follow-up with hematology for iron infusion   2. HTN (hypertension), benign  I10 401.1  well-controlled with patient restarting Diovan HCT.  Will monitor closely to see if she has recurrent hyponatremia     Follow-up and Dispositions    ?? Return in about 3 months (around 04/20/2021) for labs 1 week before.           Reviewed plan of care. Patient has provided input and agrees with goals.

## 2021-01-26 NOTE — Telephone Encounter (Signed)
-----   Message from Larie Day sent at 01/24/2021  4:42 PM EST -----  Subject: Message to Provider    QUESTIONS  Information for Provider? pt called stating the oncologist would like to   move her appt up due to some lab work showing anemia but they need to see   the pts lab results, pt stated her oncologist as  Dr. Waymond  ---------------------------------------------------------------------------  --------------  CALL BACK INFO  What is the best way for the office to contact you? OK to leave message on   voicemail  Preferred Call Back Phone Number? [phone]  ---------------------------------------------------------------------------  --------------  SCRIPT ANSWERS  Relationship to Patient? Self

## 2021-01-27 NOTE — Telephone Encounter (Signed)
Patient is requesting to have lab work faxed to Dr. Edd Arbour oncology.  Patient gave verbal permission to send lab results.  Labs faxed.

## 2021-01-28 ENCOUNTER — Encounter

## 2021-03-13 DIAGNOSIS — M109 Gout, unspecified: Secondary | ICD-10-CM | POA: Diagnosis not present

## 2021-03-13 DIAGNOSIS — E1122 Type 2 diabetes mellitus with diabetic chronic kidney disease: Secondary | ICD-10-CM | POA: Diagnosis not present

## 2021-03-13 DIAGNOSIS — Z7984 Long term (current) use of oral hypoglycemic drugs: Secondary | ICD-10-CM | POA: Diagnosis not present

## 2021-03-13 DIAGNOSIS — I10 Essential (primary) hypertension: Secondary | ICD-10-CM | POA: Diagnosis not present

## 2021-03-13 DIAGNOSIS — K219 Gastro-esophageal reflux disease without esophagitis: Secondary | ICD-10-CM | POA: Diagnosis not present

## 2021-03-13 DIAGNOSIS — E1169 Type 2 diabetes mellitus with other specified complication: Secondary | ICD-10-CM | POA: Diagnosis not present

## 2021-03-13 DIAGNOSIS — N183 Chronic kidney disease, stage 3 unspecified: Secondary | ICD-10-CM | POA: Diagnosis not present

## 2021-03-13 DIAGNOSIS — E785 Hyperlipidemia, unspecified: Secondary | ICD-10-CM | POA: Diagnosis not present

## 2021-03-17 ENCOUNTER — Encounter

## 2021-03-17 MED ORDER — ATORVASTATIN 40 MG TAB
40 mg | ORAL_TABLET | ORAL | 1 refills | Status: DC
Start: 2021-03-17 — End: 2021-10-27

## 2021-03-18 ENCOUNTER — Encounter

## 2021-03-18 MED ORDER — GABAPENTIN 300 MG CAP
300 mg | ORAL_CAPSULE | ORAL | 1 refills | Status: DC
Start: 2021-03-18 — End: 2021-07-30

## 2021-03-18 MED ORDER — TERBINAFINE 250 MG TAB
250 mg | ORAL_TABLET | ORAL | 0 refills | Status: DC
Start: 2021-03-18 — End: 2021-07-29

## 2021-03-21 ENCOUNTER — Telehealth

## 2021-03-21 MED ORDER — VALSARTAN-HYDROCHLOROTHIAZIDE 160 MG-25 MG TAB
160-25 mg | ORAL_TABLET | Freq: Every day | ORAL | 0 refills | Status: DC
Start: 2021-03-21 — End: 2021-07-30

## 2021-03-21 NOTE — Telephone Encounter (Signed)
Patient is aware of message and verbalizes understanding.

## 2021-03-21 NOTE — Telephone Encounter (Signed)
 Lamisil  is causing loss of taste. Patient is inquiring about this. Please advise    Patient needs a supply sent to [facility] on her Diovan  until her mailorder can be refilled.    Requested Prescriptions     Pending Prescriptions Disp Refills   . valsartan -hydroCHLOROthiazide (DIOVAN -HCT) 160-25 mg per tablet 30 Tablet 0     Sig: Take 1 Tablet by mouth daily.

## 2021-03-21 NOTE — Telephone Encounter (Signed)
Telephone Encounter by Nelida Meuse, LPN at 16/10/96 0454                Author: Nelida Meuse, LPN  Service: --  Author Type: Licensed Nurse       Filed: 03/21/21 1022  Encounter Date: 03/21/2021  Status: Signed          Editor: Nelida Meuse, LPN (Licensed Nurse)               Sharmaine Base    [facility]   Subject: Medication Problem     QUESTIONS   Name of Medication? terbinafine HCL (LAMISIL) 250 mg tablet   Patient-reported dosage and instructions? Take 1 Tablet Daily    What question or problem do you have with the medication? ECC received   call from PT who stated she is concerned of medication for Terbinafine   causing loss of taste. Pt is requesting a call back. Please return call to   PT   Preferred  Pharmacy? [facility], [place]   [place] phone number (if available)? [phone]   Additional Information for Provider?   ---------------------------------------------------------------------------    --------------   CALL BACK INFO   What is the best way for the office to contact you? OK to leave message on   voicemail   Preferred Call Back Phone Number? [phone]   ---------------------------------------------------------------------------    --------------   SCRIPT ANSWERS   Relationship to Patient? Self   Are you having trouble breathing? No   Do you have swelling of your face, tongue, or anywhere? No   (Is the patient requesting to be seen urgently for their symptoms?)?  No   (If answered No to all RN Triage questions send Message to Provider)? Yes

## 2021-03-21 NOTE — Telephone Encounter (Signed)
-----   Message from Sharmaine Base sent at 03/21/2021  9:57 AM EDT -----  Subject: Medication Problem    QUESTIONS  Name of Medication? valsartan-hydroCHLOROthiazide (DIOVAN-HCT) 160-25 mg   per tablet  Patient-reported dosage and instructions? Take 1 Tablet Every Night  What question or problem do you have with the medication? ECC received   call from PT Elaine [name] who stated [facility] will not be able to fill BP   medicine for another week and was told to reach out to PCP to see if she   could get medication refilled.  Preferred Pharmacy? [facility]   [place] phone number (if available)? [phone]  Additional Information for Provider? PT requesting Valsartartan/HCTZ 25MG    Tablet Dosage? Take 1 tablet every night  ---------------------------------------------------------------------------  --------------  CALL BACK INFO  What is the best way for the office to contact you? OK to leave message on   voicemail  Preferred Call Back Phone Number? [phone]  ---------------------------------------------------------------------------  --------------  SCRIPT ANSWERS  Relationship to Patient? Self

## 2021-03-21 NOTE — Telephone Encounter (Signed)
She can stop the Lamisil and give it a few weeks. If it did affect her taste it should improve off the medication.

## 2021-04-11 MED ORDER — PREDNISONE 10 MG TABLETS IN A DOSE PACK
10 mg | ORAL_TABLET | ORAL | 0 refills | Status: DC
Start: 2021-04-11 — End: 2021-06-02

## 2021-04-11 NOTE — Telephone Encounter (Signed)
-----   Message from [facility] sent at 04/10/2021  5:12 PM EDT -----  Subject: Message to Provider    QUESTIONS  Information for Provider? Pt is asking if something can be called in for   her? 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 Can you   please follow up with her to advise?   ---------------------------------------------------------------------------  --------------  CALL BACK INFO  What is the best way for the office to contact you? OK to leave message on   voicemail  Preferred Call Back Phone Number? [phone]  ---------------------------------------------------------------------------  --------------  SCRIPT ANSWERS  Relationship to Patient? Self

## 2021-04-11 NOTE — Telephone Encounter (Signed)
Patient is aware prednisone sent to the pharmacy.

## 2021-04-11 NOTE — Telephone Encounter (Signed)
Prednisone dose pack sent in for back pain

## 2021-04-22 ENCOUNTER — Encounter: Attending: Internal Medicine | Primary: Internal Medicine

## 2021-04-30 ENCOUNTER — Telehealth

## 2021-04-30 NOTE — Telephone Encounter (Signed)
-----   Message from [place] sent at 04/30/2021  2:50 PM EDT -----  Subject: Message to Provider    QUESTIONS  Information for Provider? Patient is still experiencing back pain and she   wanted to know if she was to go to a Chiropractor if that would help her   or if her insurance would cover this. She is just wanted to find out what   options she has due to her Arthritis in her lower back that is causing her   back pain. Or if she should come in for a appointment with Dr. Madelyn Brunner and   if so she would like to come in the 20th of June in the morning. Please   call patient to further advise.   ---------------------------------------------------------------------------  --------------  CALL BACK INFO  What is the best way for the office to contact you? OK to leave message on   voicemail  Preferred Call Back Phone Number? [phone]  ---------------------------------------------------------------------------  --------------  SCRIPT ANSWERS  Relationship to Patient? Self

## 2021-05-01 NOTE — Telephone Encounter (Signed)
Pt referred to spine center for further evaluation

## 2021-05-01 NOTE — Telephone Encounter (Signed)
Unable to reach pt or LVM due to mailbox being full. Will try reaching at a later time. Dr. Madelyn Brunner placed referral to spine center for back pain. Details for pt below.    RE:      You have been referred to:  [facility]  [address]  [place] [address]  Phone: [phone]

## 2021-05-02 NOTE — Telephone Encounter (Signed)
Unable to reach patient

## 2021-05-02 NOTE — Telephone Encounter (Signed)
Left message for patient she was referred to a specialist and they will be contacting her for an appointment.

## 2021-05-02 NOTE — Telephone Encounter (Signed)
LVM on pt's work number to return call.

## 2021-05-22 NOTE — Telephone Encounter (Signed)
Left message for patient advising her that she has active lab orders in the system that should be obtained prior to her upcoming appointment. Advised her to call back if she has any questions.

## 2021-05-22 NOTE — Telephone Encounter (Signed)
-----   Message from Emelia Loron sent at 05/22/2021  4:27 PM EDT -----  Subject: Message to Provider    QUESTIONS  Information for Provider? Pt is calling in to confirm if she needs to go   to lab to complete blood before her visit on 05/26/21 you may contact pt at   [phone] if that is needed   ---------------------------------------------------------------------------  --------------  CALL BACK INFO  What is the best way for the office to contact you? OK to leave message on   voicemail  Preferred Call Back Phone Number? [phone]  ---------------------------------------------------------------------------  --------------  SCRIPT ANSWERS  undefined

## 2021-05-26 ENCOUNTER — Ambulatory Visit: Admit: 2021-05-26 | Discharge: 2021-05-26 | Payer: MEDICARE | Attending: Internal Medicine | Primary: Internal Medicine

## 2021-05-26 ENCOUNTER — Inpatient Hospital Stay: Admit: 2021-05-26 | Payer: MEDICARE | Primary: Internal Medicine

## 2021-05-26 ENCOUNTER — Ambulatory Visit: Attending: Internal Medicine | Primary: Internal Medicine

## 2021-05-26 DIAGNOSIS — E871 Hypo-osmolality and hyponatremia: Secondary | ICD-10-CM

## 2021-05-26 DIAGNOSIS — Z0000 Encounter for general adult medical examination without abnormal findings: Secondary | ICD-10-CM

## 2021-05-26 LAB — CBC WITH AUTO DIFFERENTIAL
Basophils %: 1 % (ref 0–2)
Basophils Absolute: 0.1 10*3/uL (ref 0.0–0.1)
Eosinophils %: 2 % (ref 0–5)
Eosinophils Absolute: 0.1 10*3/uL (ref 0.0–0.4)
Granulocyte Absolute Count: 0 10*3/uL (ref 0.00–0.04)
Hematocrit: 30.9 % — ABNORMAL LOW (ref 35.0–45.0)
Hemoglobin: 9.5 g/dL — ABNORMAL LOW (ref 12.0–16.0)
Immature Granulocytes %: 0 % (ref 0.0–0.5)
Lymphocytes %: 12 % — ABNORMAL LOW (ref 21–52)
Lymphocytes Absolute: 0.8 10*3/uL — ABNORMAL LOW (ref 0.9–3.6)
MCH: 26.4 PG (ref 24.0–34.0)
MCHC: 30.7 g/dL — ABNORMAL LOW (ref 31.0–37.0)
MCV: 85.8 FL (ref 78.0–100.0)
MPV: 10.9 FL (ref 9.2–11.8)
Monocytes %: 7 % (ref 3–10)
Monocytes Absolute: 0.4 10*3/uL (ref 0.05–1.2)
NRBC Absolute: 0 10*3/uL (ref 0.00–0.01)
Neutrophils %: 78 % — ABNORMAL HIGH (ref 40–73)
Neutrophils Absolute: 4.7 10*3/uL (ref 1.8–8.0)
Nucleated RBCs: 0 PER 100 WBC
Platelets: 281 10*3/uL (ref 135–420)
RBC: 3.6 M/uL — ABNORMAL LOW (ref 4.20–5.30)
RDW: 15.9 % — ABNORMAL HIGH (ref 11.6–14.5)
WBC: 6.1 10*3/uL (ref 4.6–13.2)

## 2021-05-26 LAB — BASIC METABOLIC PANEL
Anion Gap: 2 mmol/L — ABNORMAL LOW (ref 3.0–18)
BUN/Creatinine Ratio: 18 (ref 12–20)
BUN: 9 MG/DL (ref 7.0–18)
CO2: 34 mmol/L — ABNORMAL HIGH (ref 21–32)
Calcium: 9.3 MG/DL (ref 8.5–10.1)
Chloride: 104 mmol/L (ref 100–111)
Creatinine: 0.51 MG/DL — ABNORMAL LOW (ref 0.6–1.3)
GFR African American: >60 mL/min/{1.73_m2} (ref >60)
Glucose: 95 mg/dL (ref 74–99)
Potassium: 4 mmol/L (ref 3.5–5.5)
Sodium: 140 mmol/L (ref 136–145)
eGFR NON-AA: >60 ml/min/1.73m2 (ref >60)

## 2021-05-26 LAB — AMB POC HEMOGLOBIN A1C
Hemoglobin A1C, POC: 5.3 %
Hemoglobin A1c (POC): 5.3 %

## 2021-05-26 LAB — PROBNP, N-TERMINAL: BNP: 124 PG/ML (ref 0–900)

## 2021-05-26 LAB — CBC WITH AUTOMATED DIFF
ABS. BASOPHILS: 0.1 10*3/uL (ref 0.0–0.1)
ABS. EOSINOPHILS: 0.1 10*3/uL (ref 0.0–0.4)
ABS. IMM. GRANS.: 0 10*3/uL (ref 0.00–0.04)
ABS. LYMPHOCYTES: 0.8 10*3/uL — ABNORMAL LOW (ref 0.9–3.6)
ABS. MONOCYTES: 0.4 10*3/uL (ref 0.05–1.2)
ABS. NEUTROPHILS: 4.7 10*3/uL (ref 1.8–8.0)
ABSOLUTE NRBC: 0 10*3/uL (ref 0.00–0.01)
BASOPHILS: 1 % (ref 0–2)
EOSINOPHILS: 2 % (ref 0–5)
HCT: 30.9 % — ABNORMAL LOW (ref 35.0–45.0)
HGB: 9.5 g/dL — ABNORMAL LOW (ref 12.0–16.0)
IMMATURE GRANULOCYTES: 0 % (ref 0.0–0.5)
LYMPHOCYTES: 12 % — ABNORMAL LOW (ref 21–52)
MCH: 26.4 PG (ref 24.0–34.0)
MCHC: 30.7 g/dL — ABNORMAL LOW (ref 31.0–37.0)
MCV: 85.8 FL (ref 78.0–100.0)
MONOCYTES: 7 % (ref 3–10)
MPV: 10.9 FL (ref 9.2–11.8)
NEUTROPHILS: 78 % — ABNORMAL HIGH (ref 40–73)
NRBC: 0 PER 100 WBC
PLATELET: 281 10*3/uL (ref 135–420)
RBC: 3.6 M/uL — ABNORMAL LOW (ref 4.20–5.30)
RDW: 15.9 % — ABNORMAL HIGH (ref 11.6–14.5)
WBC: 6.1 10*3/uL (ref 4.6–13.2)

## 2021-05-26 LAB — METABOLIC PANEL, BASIC
Anion gap: 2 mmol/L — ABNORMAL LOW (ref 3.0–18)
BUN/Creatinine ratio: 18 (ref 12–20)
BUN: 9 MG/DL (ref 7.0–18)
CO2: 34 mmol/L — ABNORMAL HIGH (ref 21–32)
Calcium: 9.3 MG/DL (ref 8.5–10.1)
Chloride: 104 mmol/L (ref 100–111)
Creatinine: 0.51 MG/DL — ABNORMAL LOW (ref 0.6–1.3)
GFR est AA: >60 mL/min/{1.73_m2} (ref >60)
GFR est non-AA: >60 mL/min/{1.73_m2} (ref >60)
Glucose: 95 mg/dL (ref 74–99)
Potassium: 4 mmol/L (ref 3.5–5.5)
Sodium: 140 mmol/L (ref 136–145)

## 2021-05-26 LAB — NT-PRO BNP: NT pro-BNP: 124 PG/ML (ref 0–900)

## 2021-05-26 NOTE — Progress Notes (Signed)
This is the Subsequent Medicare Annual Wellness Exam, performed 12 months or more after the Initial AWV or the last Subsequent AWV    I have reviewed the patient's medical history in detail and updated the computerized patient record.     [name] HTN is well controlled on  DiovanHct 160/25    [name] high cholesterol is well controlled on Lipitor 40 mg. Pt  denies any side effects like myalgias.  Pt has been controlling her prediabetes with diet and weight loss.      Thyroid Review:  Patient is seen for followup of hypothyroidism.   Thyroid ROS: denies fatigue, weight changes, heat/cold intolerance, bowel/skin changes or CVS symptoms. 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 Will check TSH level soon.     Patient continues on vitamin D 5000 units 3 times a week for vitamin D deficiency.    Patient has sleep apnea and is using a CPAP machine and followed by sleep specialist        Patient has asthma that is controlled on Singulair and  Albuterol prn. She does not appear to be taking steroid inhaler on a regular basis.       Patient will continue to work on trying to lose weight and decrease in her BMI from 34 with cutting back starches and sweets and being as active as possible.    Patient also has a history of anemia for which he gets iron infusions about every 3 months with hematology.    Patient has chronic lower back pain which has been exacerbated recently.  We will go ahead and refer her to physical therapy for core strengthening.    Assessment/Plan   Education and counseling provided:  Are appropriate based on today's review and evaluation  End-of-Life planning (with patient's consent)    1. Medicare annual wellness visit, subsequent  2. HTN (hypertension), benign  -     METABOLIC PANEL, COMPREHENSIVE; Future  3. Hypercholesteremia  -     LIPID PANEL; Future  4. Postablative hypothyroidism  -     TSH 3RD  GENERATION; Future  5. Iron deficiency anemia due to chronic blood loss  6. Vitamin D deficiency  -     VITAMIN D, 25 HYDROXY; Future  7. Chronic midline low back pain without sciatica  -     REFERRAL TO PHYSICAL THERAPY  8. Prediabetes  -     AMB POC HEMOGLOBIN A1C  -     HEMOGLOBIN A1C W/O EAG; Future       Depression Risk Factor Screening     3 most recent PHQ Screens 05/26/2021   Little interest or pleasure in doing things Not at all   Feeling down, depressed, irritable, or hopeless Not at all   Total Score PHQ 2 0   Trouble falling or staying asleep, or sleeping too much -   Feeling tired or having little energy -   Poor appetite, weight loss, or overeating -   Feeling bad about yourself - or that you are a failure or have let yourself or your family down -   Trouble concentrating on things such as school, work, reading, or watching TV -   Moving or speaking so slowly that other people could have noticed; or the opposite being so fidgety that others notice -   Thoughts of being better off dead, or hurting yourself in some way -   PHQ 9  Score -       Alcohol & Drug Abuse Risk Screen    Do you average more than 1 drink per night or more than 7 drinks a week:  No    On any one occasion in the past three months have you have had more than 3 drinks containing alcohol:  No          Functional Ability and Level of Safety    Hearing: Hearing is good.      Activities of Daily Living:  The home contains: no safety equipment.  Patient does total self care      Ambulation: with no difficulty     Fall Risk:  Fall Risk Assessment, last 12 mths 05/26/2021   Able to walk? Yes   Fall in past 12 months? 0   Do you feel unsteady? 0   Are you worried about falling 0      Abuse Screen:  Patient is not abused       Cognitive Screening    Has your family/caregiver stated any concerns about your memory: yes - patient states she has concerns about her memory.     Cognitive Screening: Normal - .    Health Maintenance Due     Health  Maintenance Due   Topic Date Due   ??? Shingrix Vaccine Age 85> (1 of 2) Never done   ??? COVID-19 Vaccine (3 - Booster for Pfizer series) 08/20/2020       Glaucoma Screening-  UTD  Pneumonia Vaccine-  UTD  Shingles Vaccine-  Pt aware of Shingrinx  Tdap Vaccine- 10/2013  Colonoscopy-  04/2020 Dr Angelique Holm f/u  7 yrs  Mammogram 12/2020  Advance Directive-  Pt given information   Patient Care Team   Patient Care Team:  Thana Farr, MD as PCP - General (Internal Medicine Physician)  Thana Farr, MD as PCP - [facility] Empaneled Provider  Vernetta Honey, MD (Ophthalmology)  Sloan Leiter, MD (Gastroenterology)  Jonette Eva, MD (Hematology Physician)  Donnelly Stager, MD (Neurology)  Carlyle Dolly, MD (Cardiovascular Disease Physician)  Fredirick Lathe, MD (Allergy)  Tressie Stalker, MD (Otolaryngology)  Melissa Montane, DO (Pulmonary Disease)    History     Patient Active Problem List   Diagnosis Code   ??? DOE (dyspnea on exertion) R06.00   ??? Hypothyroidism E03.9   ??? Pure hypercholesterolemia E78.00   ??? HTN (hypertension), benign I10   ??? GERD (gastroesophageal reflux disease) K21.9   ??? Asthma J45.909   ??? Vitamin D deficiency E55.9   ??? Iron deficiency anemia D50.9   ??? Submental lymphadenopathy R59.0   ??? Sciatica M54.30   ??? Depression F32.A   ??? Meralgia paraesthetica G57.10   ??? Obesity (BMI 30-39.9) E66.9   ??? Pre-diabetes R73.03   ??? Abnormal stress test R94.39   ??? Need for hepatitis C screening test Z11.59   ??? Glaucoma screening Z13.5   ??? Left knee pain M25.562   ??? Swelling of left lower extremity M79.89   ??? Osteoarthritis of left knee M17.12   ??? Post-menopausal Z78.0   ??? 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 Dehydration E86.0   ??? Migraine without aura and without status migrainosus, not intractable G43.009   ??? Claustrophobia F40.240   ??? Chronic midline low back pain without sciatica M54.50, G89.29   ??? Rash R21   ??? Encounter for long-term (current) use of medications Z79.899   ??? URI (upper respiratory  infection)  J06.9   ??? Cough R05.9   ??? Routine general medical examination at a health care facility Z00.00   ??? Need for pneumococcal vaccination Z23   ??? Hospital discharge follow-up Z09   ??? Chest pain R07.9   ??? Chronic diastolic congestive heart failure (HCC) I50.32   ??? Severe obesity (BMI 35.0-39.9) with comorbidity ([place]) E66.01   ??? Sore throat J02.9   ??? Memory problem R41.3     Past Medical History:   Diagnosis Date   ??? Anemia    ??? Asthma    ??? Colon polyp    ??? DDD (degenerative disc disease) 02/15/2009   ??? Depression 12/18/2011   ??? Essential hypertension, benign    ??? History of echocardiogram 03/20/2004    EF 70%.  Borderline DDfx.  No significant valvular pathology.   ??? Hypercholesterolemia    ??? Hypertension    ??? Iron deficiency anemia 7/51/0258   ??? 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 Lower extremity venous duplex 10/23/2009    Left leg:  No DVT.     ??? Meralgia paraesthetica 10/22/2013   ??? OA (osteoarthritis of spine) 02/15/2009   ??? OA (osteoarthritis of spine) 02/15/2009   ??? Obesity, unspecified    ??? Other and unspecified hyperlipidemia    ??? Pre-operative cardiovascular examination     For spine surgery   ??? Right sided sciatica    ??? Shortness of breath     Possible asthma, HCVD; less likely CAD (Noted 03/15/09)   ??? Sleep apnea 02/15/2009    uses cpap machine   ??? Thallium stress test  03/20/2004    Partially transient, mod basal & mid anterior defect most c/w artifact; mild anterior ischemia less likely.  Neg EKG on pharm stress test.   ??? Thyroid disease     hypothyroidism      Past Surgical History:   Procedure Laterality Date   ??? COLONOSCOPY N/A 04/25/2020    SCREENING COLONOSCOPY performed by Sloan Leiter, MD at [place]   ??? HX COLONOSCOPY  02-24-12    normal (Dr. Angelique Holm)   ??? HX HERNIA REPAIR      3x   ??? HX HYSTERECTOMY     ??? HX TUBAL LIGATION     ??? NEUROLOGICAL PROCEDURE UNLISTED  03-16-2009    s/p ACD & Fusion (Dr.P.Gurtner)   ??? PR COLONOSCOPY FLX DX W/COLLJ SPEC WHEN PFRMD  02-2007    +polyp(tubular adenoma); Dr Angelique Holm      Current Outpatient Medications   Medication Sig Dispense Refill   ??? valsartan-hydroCHLOROthiazide (DIOVAN-HCT) 160-25 mg per tablet Take 1 Tablet by mouth daily. 30 Tablet 0   ??? gabapentin (NEURONTIN) 300 mg capsule TAKE 1 CAPSULE EVERY MORNING AND TAKE 2 CAPSULES EVERY NIGHT (MAX DAILY AMOUNT : 900MG ) 270 Capsule 1   ??? atorvastatin (LIPITOR) 40 mg tablet TAKE 1 TABLET EVERY DAY (NEED MD APPOINTMENT) 90 Tablet 1   ??? montelukast (Singulair) 10 mg tablet Take 1 Tablet by mouth daily. 90 Tablet 3   ??? levothyroxine (SYNTHROID) 150 mcg tablet TAKE 1 TABLET EVERY DAY BEFORE BREAKFAST 90 Tablet 0   ??? cyanocobalamin (VITAMIN B12) 100 mcg tablet Take 100 mcg by mouth daily.     ??? ZINC PO Take  by mouth.     ??? coffee xt/phosphatidyl serine (NEURIVA ORIGINAL PO) Take  by mouth.     ??? albuterol (PROVENTIL HFA, VENTOLIN HFA, PROAIR HFA) 90 mcg/actuation inhaler TAKE 2 PUFFS BY INHALATION EVERY FOUR (4) HOURS  AS NEEDED FOR WHEEZING. 1 Inhaler 5   ??? potassium chloride (KLOR-CON) 10 mEq tablet TAKE 1 TABLET EVERY DAY 90 Tablet 3   ??? vit B complx C/folic acid/zinc (VIT B COMPLEX-C-FOLIC AC-ZINC PO) Take 1 Tab by mouth daily.     ??? OTHER 1 Tab daily. Align Probiotic     ??? latanoprost (XALATAN) 0.005 % ophthalmic solution Administer 1 Drop to both eyes nightly.     ??? multivitamin (ONE A DAY) tablet Take 1 Tab by mouth daily.     ??? Cholecalciferol, Vitamin D3, (VITAMIN D-3) 5,000 unit Tab Take  by mouth daily.     ??? terbinafine HCL (LAMISIL) 250 mg tablet TAKE 1 TABLET EVERY DAY (Patient not taking: Reported on 05/26/2021) 90 Tablet 0     Allergies   Allergen Reactions   ??? Aspirin Other (comments)     GI distress,    ??? Asa-Acetaminophen-Caff-[place] Other (comments)     Bleeding in stomach   ??? Benadryl [Diphenhydramine Hcl] Other (comments)     Muscle jerking   ??? Other Plant, Animal, Environmental Not Reported This Time     Grass, Dust and Mold   ??? Pollen Extracts Not Reported This Time       Family History   Problem Relation Age of  Onset   ??? Cancer Mother    ??? Hypertension Mother    ??? Hypertension Sister         x3   ??? Heart Surgery Sister    ??? Heart Disease Sister    ??? Heart Attack Sister    ??? Cancer Maternal Aunt         breast   ??? Breast Cancer Maternal Aunt    ??? Glaucoma Son    ??? Heart defect Son    ??? Diabetes Paternal Uncle    ??? Hypertension Paternal Uncle      Social History     Tobacco Use   ??? Smoking status: Former Smoker     Packs/day: 1.00     Years: 28.00     Pack years: 28.00     Start date: 10/16/1977     Quit date: 04/25/2004     Years since quitting: 17.1   ??? Smokeless tobacco: Never Used   Substance Use Topics   ??? Alcohol use: No     BP (!) 116/58 (BP 1 Location: Left arm, BP Patient Position: Sitting, BP Cuff Size: Adult)    Pulse 89    Temp 97.6 ??F (36.4 ??C) (Temporal)    Resp 20    Ht 5\' 2"  (1.575 m)    Wt 190 lb (86.2 kg)    SpO2 95%    BMI 34.75 kg/m??     Results for orders placed or performed during the hospital encounter of 29/56/21   METABOLIC PANEL, BASIC   Result Value Ref Range    Sodium 140 136 - 145 mmol/L    Potassium 4.0 3.5 - 5.5 mmol/L    Chloride 104 100 - 111 mmol/L    CO2 34 (H) 21 - 32 mmol/L    Anion gap 2 (L) 3.0 - 18 mmol/L    Glucose 95 74 - 99 mg/dL    BUN 9 7.0 - 18 MG/DL    Creatinine 0.51 (L) 0.6 - 1.3 MG/DL    BUN/Creatinine ratio 18 12 - 20      GFR est AA >60 >60 ml/min/1.43m2    GFR est non-AA >60 >60 ml/min/1.25m2  Calcium 9.3 8.5 - 10.1 MG/DL   CBC WITH AUTOMATED DIFF   Result Value Ref Range    WBC 6.1 4.6 - 13.2 K/uL    RBC 3.60 (L) 4.20 - 5.30 M/uL    HGB 9.5 (L) 12.0 - 16.0 g/dL    HCT 30.9 (L) 35.0 - 45.0 %    MCV 85.8 78.0 - 100.0 FL    MCH 26.4 24.0 - 34.0 PG    MCHC 30.7 (L) 31.0 - 37.0 g/dL    RDW 15.9 (H) 11.6 - 14.5 %    PLATELET 281 135 - 420 K/uL    MPV 10.9 9.2 - 11.8 FL    NRBC 0.0 0 PER 100 WBC    ABSOLUTE NRBC 0.00 0.00 - 0.01 K/uL    NEUTROPHILS 78 (H) 40 - 73 %    LYMPHOCYTES 12 (L) 21 - 52 %    MONOCYTES 7 3 - 10 %    EOSINOPHILS 2 0 - 5 %    BASOPHILS 1 0 - 2 %     IMMATURE GRANULOCYTES 0 0.0 - 0.5 %    ABS. NEUTROPHILS 4.7 1.8 - 8.0 K/UL    ABS. LYMPHOCYTES 0.8 (L) 0.9 - 3.6 K/UL    ABS. MONOCYTES 0.4 0.05 - 1.2 K/UL    ABS. EOSINOPHILS 0.1 0.0 - 0.4 K/UL    ABS. BASOPHILS 0.1 0.0 - 0.1 K/UL    ABS. IMM. GRANS. 0.0 0.00 - 0.04 K/UL    DF AUTOMATED     NT-PRO BNP   Result Value Ref Range    NT pro-BNP 124 0 - 900 PG/ML     Labs:   Lab Results   Component Value Date/Time    Cholesterol, total 141 01/03/2021 12:00 AM    HDL Cholesterol 52 01/03/2021 12:00 AM    LDL, calculated 74 01/03/2021 12:00 AM    LDL, calculated 104 (H) 06/06/2019 09:38 AM    Triglyceride 76 01/03/2021 12:00 AM    CHOL/HDL Ratio 3.2 10/10/2018 03:55 PM     Labs:   Lab Results   Component Value Date/Time    TSH 6.160 (H) 01/03/2021 12:00 AM    T4, Free 1.3 03/29/2017 05:30 PM      A comprehensive 5 year plan for medical care and screening exams was reviewed with pt and they received a copy of it.      Follow-up and Dispositions    ?? Return in about 6 months (around 11/25/2021) for labs 1 week before.           Larene Pickett MD

## 2021-05-26 NOTE — Progress Notes (Signed)
 Patient is in the office today for a 4 month follow up, and Medicare Wellness Visit.    Do you have an Advance Directive no  Do you want more information : information given     1. Have you been to the ER, urgent care clinic since your last visit?  Hospitalized since your last visit? No    2. Have you seen or consulted any other health care providers outside of the [facility] since your last visit? No     3. For patients aged 71-75: Has the patient had a colonoscopy / FIT/ Cologuard? Yes - no Care Gap present      If the patient is female:    4. For patients aged 65-74: Has the patient had a mammogram within the past 2 years? Yes - no Care Gap present      5. For patients aged 21-65: Has the patient had a pap smear? NA - based on age or sex

## 2021-06-02 NOTE — Telephone Encounter (Signed)
Tell patient to keep the correct list and take medications to each physician, otherwise medications get discontinued.  If she takes it, I will send the prescription.  Please let me know

## 2021-06-02 NOTE — Telephone Encounter (Signed)
 Dr Waldon discontinued it as not a current medication

## 2021-06-02 NOTE — Telephone Encounter (Signed)
Please check why insurance is canceling Coreg prescription on this patient and what we need to do.  Thanks

## 2021-06-04 NOTE — Telephone Encounter (Signed)
Called, unable to leave message for patient, mailbox full

## 2021-06-12 NOTE — Telephone Encounter (Signed)
Called,, unable to reach patient, mailbox full.

## 2021-07-04 ENCOUNTER — Ambulatory Visit: Admit: 2021-07-04 | Discharge: 2021-07-04 | Payer: MEDICARE | Attending: Specialist | Primary: Internal Medicine

## 2021-07-04 ENCOUNTER — Ambulatory Visit: Attending: Specialist | Primary: Internal Medicine

## 2021-07-04 DIAGNOSIS — I5032 Chronic diastolic (congestive) heart failure: Secondary | ICD-10-CM

## 2021-07-04 NOTE — Progress Notes (Signed)
HISTORY OF PRESENT ILLNESS  Elaine [name] is a 70 y.o. female.    Follow-up of CHF, hypertension, hyperlipidemia and obesity    05/2018 - Mild SOB today related to asthma - improved with inhaler use.  8/21 intermittent wheezing and shortness of breath which is improved with inhalers.      Shortness of Breath  The history is provided by the Patient. This is a new problem. The average episode lasts 5 minutes. The problem occurs intermittently.The current episode started more than 1 week ago (9/17). The problem has not changed since onset.Pertinent negatives include no fever, no cough, no wheezing, no PND, no orthopnea, no vomiting, no rash, no leg swelling and no claudication. The problem's precipitants include exercise (steps in a hurry;).     Review of Systems   Constitutional:  Negative for chills, fever, malaise/fatigue and weight loss.   HENT:  Negative for nosebleeds.    Eyes:  Negative for discharge.   Respiratory:  Positive for shortness of breath. Negative for cough and wheezing.    Cardiovascular:  Negative for palpitations, orthopnea, claudication, leg swelling and PND.   Gastrointestinal:  Negative for diarrhea, nausea and vomiting.   Genitourinary:  Negative for dysuria and hematuria.   Musculoskeletal:  Negative for joint pain.   Skin:  Negative for rash.   Neurological:  Negative for dizziness, seizures and loss of consciousness.   Endo/Heme/Allergies:  Negative for polydipsia. Does not bruise/bleed easily.   Psychiatric/Behavioral:  Negative for depression and substance abuse. The patient does not have insomnia.    Allergies   Allergen Reactions   ??? Aspirin Other (comments)     GI distress,    ??? Asa-Acetaminophen-Caff-Potass Other (comments)     Bleeding in stomach   ??? Benadryl [Diphenhydramine Hcl] Other (comments)     Muscle jerking   ??? Other Plant, Animal, Environmental Not Reported This Time     Grass, Dust and Mold   ??? Pollen Extracts Not Reported This Time       Past Medical History:    Diagnosis Date   ??? Anemia    ??? Asthma    ??? Colon polyp    ??? DDD (degenerative disc disease) 02/15/2009   ??? Depression 12/18/2011   ??? Essential hypertension, benign    ??? History of echocardiogram 03/20/2004    EF 70%.  Borderline DDfx.  No significant valvular pathology.   ??? Hypercholesterolemia    ??? Hypertension    ??? Iron deficiency anemia 99991111   ??? 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 Lower extremity venous duplex 10/23/2009    Left leg:  No DVT.     ??? Meralgia paraesthetica 10/22/2013   ??? OA (osteoarthritis of spine) 02/15/2009   ??? OA (osteoarthritis of spine) 02/15/2009   ??? Obesity, unspecified    ??? Other and unspecified hyperlipidemia    ??? Pre-operative cardiovascular examination     For spine surgery   ??? Right sided sciatica    ??? Shortness of breath     Possible asthma, HCVD; less likely CAD (Noted 03/15/09)   ??? Sleep apnea 02/15/2009    uses cpap machine   ??? Thallium stress test  03/20/2004    Partially transient, mod basal & mid anterior defect most c/w artifact; mild anterior ischemia less likely.  Neg EKG on pharm stress test.   ??? Thyroid disease     hypothyroidism       Family History   Problem Relation Age of Onset   ???  Cancer Mother    ??? Hypertension Mother    ??? Hypertension Sister         x3   ??? Heart Surgery Sister    ??? Heart Disease Sister    ??? Heart Attack Sister    ??? Cancer Maternal Aunt         breast   ??? Breast Cancer Maternal Aunt    ??? Glaucoma Son    ??? Heart defect Son    ??? Diabetes Paternal Uncle    ??? Hypertension Paternal Uncle        Social History       Tobacco Use   ??? Smoking status: Former     Packs/day: 1.00     Years: 28.00     Pack years: 28.00     Types: Cigarettes     Start date: 10/16/1977     Quit date: 04/25/2004     Years since quitting: 17.2   ??? Smokeless tobacco: Never   Substance Use Topics   ??? Alcohol use: No   ??? Drug use: No        Current Outpatient Medications   Medication Sig   ??? pantoprazole (PROTONIX) 40 mg tablet    ??? valsartan-hydroCHLOROthiazide (DIOVAN-HCT) 160-25 mg per tablet  Take 1 Tablet by mouth daily.   ??? terbinafine HCL (LAMISIL) 250 mg tablet TAKE 1 TABLET EVERY DAY   ??? gabapentin (NEURONTIN) 300 mg capsule TAKE 1 CAPSULE EVERY MORNING AND TAKE 2 CAPSULES EVERY NIGHT (MAX DAILY AMOUNT : '900MG'$ )   ??? atorvastatin (LIPITOR) 40 mg tablet TAKE 1 TABLET EVERY DAY (NEED MD APPOINTMENT)   ??? montelukast (Singulair) 10 mg tablet Take 1 Tablet by mouth daily.   ??? levothyroxine (SYNTHROID) 150 mcg tablet TAKE 1 TABLET EVERY DAY BEFORE BREAKFAST   ??? cyanocobalamin (VITAMIN B12) 100 mcg tablet Take 100 mcg by mouth daily.   ??? ZINC PO Take  by mouth.   ??? coffee xt/phosphatidyl serine (NEURIVA ORIGINAL PO) Take  by mouth.   ??? albuterol (PROVENTIL HFA, VENTOLIN HFA, PROAIR HFA) 90 mcg/actuation inhaler TAKE 2 PUFFS BY INHALATION EVERY FOUR (4) HOURS AS NEEDED FOR WHEEZING.   ??? potassium chloride (KLOR-CON) 10 mEq tablet TAKE 1 TABLET EVERY DAY   ??? vit B complx C/folic acid/zinc (VIT B COMPLEX-C-FOLIC AC-ZINC PO) Take 1 Tab by mouth daily.   ??? OTHER 1 Tab daily. Align Probiotic   ??? latanoprost (XALATAN) 0.005 % ophthalmic solution Administer 1 Drop to both eyes nightly.   ??? multivitamin (ONE A DAY) tablet Take 1 Tab by mouth daily.   ??? cholecalciferol (VITAMIN D3) (5000 Units/125 mcg) tab tablet Take  by mouth daily.     No current facility-administered medications for this visit.        Past Surgical History:   Procedure Laterality Date   ??? COLONOSCOPY N/A 04/25/2020    SCREENING COLONOSCOPY performed by Sloan Leiter, MD at [facility]   ??? HX COLONOSCOPY  02-24-12    normal (Dr. Angelique Holm)   ??? HX HERNIA REPAIR      3x   ??? HX HYSTERECTOMY     ??? HX TUBAL LIGATION     ??? NEUROLOGICAL PROCEDURE UNLISTED  03-16-2009    s/p ACD & Fusion (Dr.P.Gurtner)   ??? PR COLONOSCOPY FLX DX W/COLLJ SPEC WHEN PFRMD  02-2007    +polyp(tubular adenoma); Dr Angelique Holm       Visit Vitals  BP (!) 107/51   Pulse (!) 105  Ht '5\' 2"'$  (1.575 m)   Wt 83.5 kg (184 lb)   SpO2 93%   BMI 33.65 kg/m??       Diagnostic Studies:  I have reviewed  the relevant tests done on the patient and show as follows  EKG tracings reviewed by me today.      1/18 Nuc Stress  Diagnosis:   Probably normal scan.    Evidence of a small mild fixed inferior wall defect noted from his nuclear study suggestive of tissue attenuation with normal wall motion in the area.    No reversible defect suggesting ischemia noted from his nuclear study.   Normal left ventricular size and systolic function.   Low risk scan.     1/18 ECHO  SUMMARY:  Left ventricle: Systolic function was hyperdynamic. Ejection fraction was  estimated in the range of 70 % to 75 %. There were no regional wall motion  abnormalities. There was mild concentric hypertrophy. Doppler parameters  were consistent with abnormal left ventricular relaxation (grade 1  diastolic dysfunction).    Mitral valve: There was mild annular calcification.    Elaine [name] has a reminder for a "due or due soon" health maintenance. I have asked that she contact her primary care provider for follow-up on this health maintenance.    Physical Exam  Constitutional:       General: She is not in acute distress.     Appearance: She is well-developed. She is obese.      Comments: sev obese   HENT:      Head: Normocephalic and atraumatic.      Mouth/Throat:      Dentition: Normal dentition.   Eyes:      General: No scleral icterus.        Right eye: No discharge.         Left eye: No discharge.   Neck:      Thyroid: No thyromegaly.      Vascular: No carotid bruit or JVD.   Cardiovascular:      Rate and Rhythm: Normal rate and regular rhythm.      Pulses: Intact distal pulses.      Heart sounds: Normal heart sounds, S1 normal and S2 normal. No murmur heard.    No friction rub. No gallop.   Pulmonary:      Effort: Pulmonary effort is normal.      Breath sounds: Normal breath sounds. No wheezing or rales.   Abdominal:      Palpations: Abdomen is soft. There is no mass.      Tenderness: There is no abdominal tenderness.   Musculoskeletal:      Cervical  back: Neck supple.      Right lower leg: No edema.      Left lower leg: Edema (trace) present.   Lymphadenopathy:      Cervical:      Right cervical: No superficial cervical adenopathy.     Left cervical: No superficial cervical adenopathy.   Skin:     General: Skin is warm and dry.      Findings: No rash.   Neurological:      Mental Status: She is alert and oriented to person, place, and time.   Psychiatric:         Behavior: Behavior normal.         ASSESSMENT and PLAN    HLD :    Latest Reference Range & Units 09/01/19 03:12 02/27/20 08:33 01/03/21 00:00   Triglyceride  0 - 149 mg/dL 108 90 76   Cholesterol, total 100 - 199 mg/dL 159 160 141   HDL Cholesterol >39 mg/dL 61 57 52   VLDL, calculated 5 - 40 mg/dL '19 17 15   '$ LDL, calculated 0 - 99 mg/dL 79 86 74         7/20 Stable CHF.  B/P is controlled.  Continue same medications.  NYHA II.  Weight diet and exercise discussed.    07/18/2020 CHF compensated.  Blood pressure is well controlled.  Lipids are better this year than last year.  She is trying to lose weight.  Mediterranean diet guidelines printed.  Exercise and physical therapy for the back encouraged.    Diagnoses and all orders for this visit:    1. Chronic diastolic congestive heart failure ([place])    2. HTN (hypertension), benign  -     AMB POC EKG ROUTINE W/ 12 LEADS, INTER & REP    3. Pure hypercholesterolemia    4. Obesity (BMI 30.0-34.9)        Pertinent laboratory and test data reviewed and discussed with patient.  See patient instructions also for other medical advice given    There are no discontinued medications.    Follow-up and Dispositions    Return in about 1 year (around 07/04/2022), or if symptoms worsen or fail to improve, for with ekg.       07/04/2021 CHF is compensated NYHA class II.  Blood pressure is controlled well.  Since no dizziness, continue same medications.  Lipids are controlled well.  Diet weight and exercise discussed.  Mediterranean diet guidelines given.

## 2021-07-04 NOTE — Progress Notes (Signed)
1. Have you been to the ER, urgent care clinic since your last visit?  Hospitalized since your last visit?No    2. Have you seen or consulted any other health care providers outside of the [facility] since your last visit?  Include any pap smears or colon screening. No    3.  Since your last visit, have you had any of the following symptoms?      shortness of breath.     4.  Have you had any blood work, X-rays or cardiac testing?      Yes When: 05/2021 Where: PCP Dr. Madelyn Brunner     Requested: NO     In [facility]: YES    5.  Where do you normally have your labs drawn?   PCP, [facility]    6. Do you need any refills today?   No

## 2021-07-18 ENCOUNTER — Encounter: Attending: Specialist | Primary: Internal Medicine

## 2021-07-29 ENCOUNTER — Ambulatory Visit
Admit: 2021-07-29 | Discharge: 2021-07-29 | Payer: MEDICARE | Attending: Physical Medicine & Rehabilitation | Primary: Internal Medicine

## 2021-07-29 ENCOUNTER — Ambulatory Visit: Attending: Physical Medicine & Rehabilitation | Primary: Internal Medicine

## 2021-07-29 DIAGNOSIS — M47816 Spondylosis without myelopathy or radiculopathy, lumbar region: Secondary | ICD-10-CM

## 2021-07-29 MED ORDER — LIDOCAINE 5 % (700 MG/PATCH) ADHESIVE PATCH
5 % | CUTANEOUS | 3 refills | Status: AC
Start: 2021-07-29 — End: ?

## 2021-07-29 NOTE — Progress Notes (Signed)
Elaine [name] presents today for No chief complaint on file.      Is someone accompanying this pt? no    Is the patient using any DME equipment during [place]? no    Depression Screening:  3 most recent PHQ Screens 05/26/2021   Little interest or pleasure in doing things Not at all   Feeling down, depressed, irritable, or hopeless Not at all   Total Score PHQ 2 0   Trouble falling or staying asleep, or sleeping too much -   Feeling tired or having little energy -   Poor appetite, weight loss, or overeating -   Feeling bad about yourself - or that you are a failure or have let yourself or your family down -   Trouble concentrating on things such as school, work, reading, or watching TV -   Moving or speaking so slowly that other people could have noticed; or the opposite being so fidgety that others notice -   Thoughts of being better off dead, or hurting yourself in some way -   PHQ 9 Score -       Learning Assessment:  Learning Assessment 03/07/2018   PRIMARY LEARNER Patient   HIGHEST LEVEL OF EDUCATION - PRIMARY LEARNER  -   BARRIERS PRIMARY LEARNER -   CO-LEARNER CAREGIVER -   PRIMARY LANGUAGE ENGLISH   LEARNER PREFERENCE PRIMARY READING   ANSWERED BY patient   RELATIONSHIP SELF       Abuse Screening:  Abuse Screening Questionnaire 03/05/2020   Do you ever feel afraid of your partner? N   Are you in a relationship with someone who physically or mentally threatens you? N   Is it safe for you to go home? Y       Fall Risk  Fall Risk Assessment, last 12 mths 05/26/2021   Able to walk? Yes   Fall in past 12 months? 0   Do you feel unsteady? 0   Are you worried about falling 0       OPIOID RISK TOOL  No flowsheet data found.    Coordination of Care:  1. Have you been to the ER, urgent care clinic since your last visit? no  Hospitalized since your last visit? no    2. Have you seen or consulted any other health care providers outside of the [facility] since your last visit? Yes, PCP. Include any pap smears or  colon screening. No.

## 2021-07-29 NOTE — Progress Notes (Signed)
Progress Notes by Charm Barges, MD at 07/29/21 1000                Author: Charm Barges, MD  Service: --  Author Type: Physician       Filed: 07/29/21 1127  Encounter Date: 07/29/2021  Status: Signed          Editor: Charm Barges, MD (Physician)                      [facility]  [address]   [place] [address]   Phone: [phone]   Fax: [phone]         Patient: Elaine [name]                                                                               MRN: [id number]         Date of Birth: 08-Feb-1951           AGE: 70 y.o.               PCP: Thana Farr, MD   Date:  07/29/21      Reason for Consultation: Arm Pain (Left upper)         HPI:   Elaine [name] is a 70 y.o. female with relevant PMH of  C3-5 fusion in 2010, HTN, HLD  who presents with chronic low back pain which has been presents with over 30 years of intermittent which has  become constant over the past few months.  She was previously seen by pain management many years ago and tried epidural injections which helped for a period of time but then she decided to stop getting them.        Denies any precipitating incident or trauma.         Neurologic symptoms: No numbness, tingling, weakness, bowel or bladder changes.  No recent falls        Location: The pain is located in the low back pain    Radiation: The pain does not radiate.     Pain Score: Currently: 3/10    Quality: Pain is of a Stabbing quality.     Aggravating: Pain is exacerbated by walking and standing   Alleviating: The pain is alleviated by lying down      Prior Treatments:   Physical therapy: YES several years ago    Injections:YES- helped a bit but stopped several years ago    Chiropractic treatments- years ago which helped    Previous Medications:    Current Medications: tylenol , gabapentin '300mg'$  in am, '600mg'$  at night    Previous work-up has included:    Bone density 2016-  wnl   X-ray right hip- 2014   Mild to moderate degenerative changes of the hips and SI joints. Moderate  multilevel degenerative disc and joint disease within the visual lumbar spine.  No findings are clearly acute.   Stable  sclerotic lesion within the right femoral neck. Nearly 4 years of  stability compatible with a benign process.       MRI cervical spine 2016   No enhancing abnormalities.  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 No associated hydrocephalus, or cord syrinx.       Postoperative changes after anterior plating and fusion, C3-C5.    Degenerative spondylosis, C5/C6 and C6/C7, without significant alterations, all other is an element of mild central canal stenosis on a multifactorial basis, C5-C6.       Central canal contents, and paravertebral soft tissues otherwise unremarkable.          MRI lumbar spine 2011   L1-L2: Unremarkable.   L2-L3: Also unremarkable.   L3-L4: Posterior disc bulge and left postero lateral corner   protrusion. AP central canal is more than 10 mm.  No deformity of the  thecal sac. Mild facet and ligamentous hypertrophy. Mild right  foraminal stenosis with mild indentation of exiting nerve root from  posterior disc bulge. On the left, moderate left foraminal stenosis   with more compression of  the left exiting L3 nerve root from disc  protrusion. Slightly more severe left facet hypertrophy and  ligamentous hypertrophy as well.  Overall, grossly stable since last  MRI   L4-L5: Posterior disc bulge with mild central stenosis. AP central   canal measures 9 mm. More severe facet arthropathy with moderate  right facet joint effusion which is new. No significant right  foraminal stenosis. Mild to moderate left foraminal stenosis with  mild impingement of the left exiting L4 nerve root from  posterior   disc bulge. Facet joint effusion also present on the left.  Slightly  more severe than before.   L5-S1: Left posterolateral disc protrusion, indenting thecal sac, likely S1 root  on the left. Facet hypertrophy also present. Severe   bilateral foraminal stenosis with impingement of bilateral L5   exiting nerve root.  Slightly more severe than before   IMPRESSION: Stable to mild progression of left-sided disease in the  lower  lumbar levels as above.     Past Medical History:      Past Medical History:        Diagnosis  Date         ?  Anemia       ?  Asthma       ?  Colon polyp       ?  DDD (degenerative disc disease)  02/15/2009     ?  Depression  12/18/2011     ?  Essential hypertension, benign       ?  History of echocardiogram  03/20/2004          EF 70%.  Borderline DDfx.  No significant valvular pathology.         ?  Hypercholesterolemia       ?  Hypertension       ?  Iron deficiency anemia  02/15/2009     ?  Lichen planus  99991111     ?  Lower extremity venous duplex  10/23/2009          Left leg:  No DVT.           ?  Meralgia paraesthetica  10/22/2013     ?  OA (osteoarthritis of spine)  02/15/2009     ?  OA (osteoarthritis of spine)  02/15/2009     ?  Obesity, unspecified       ?  Other and unspecified hyperlipidemia       ?  Pre-operative cardiovascular examination            For spine surgery         ?  Right sided sciatica       ?  Shortness of breath            Possible asthma, HCVD; less likely CAD (Noted 03/15/09)         ?  Sleep apnea  02/15/2009          uses cpap machine         ?  Thallium stress test   03/20/2004          Partially transient, mod basal & mid anterior defect most c/w artifact; mild anterior ischemia less likely.  Neg EKG on pharm stress test.         ?  Thyroid disease            hypothyroidism         Past Surgical History:      Past Surgical History:         Procedure  Laterality  Date          ?  COLONOSCOPY  N/A  04/25/2020          SCREENING COLONOSCOPY performed by Sloan Leiter, MD at [facility]          ?  HX COLONOSCOPY    02-24-12          normal (Dr. Angelique Holm)          ?  HX HERNIA REPAIR              3x          ?  HX HYSTERECTOMY         ?  HX  TUBAL LIGATION         ?  NEUROLOGICAL PROCEDURE UNLISTED    03-16-2009          s/p ACD & Fusion (Dr.P.Gurtner)          ?  PR COLONOSCOPY FLX DX W/COLLJ SPEC WHEN PFRMD    02-2007          +polyp(tubular adenoma); Dr Angelique Holm         SocHx:      Social History          Tobacco Use         ?  Smoking status:  Former              Packs/day:  1.00         Years:  28.00         Pack years:  28.00         Types:  Cigarettes         Start date:  10/16/1977         Quit date:  04/25/2004              Years since quitting:  17.2         ?  Smokeless tobacco:  Never       Substance Use Topics         ?  Alcohol use:  No         FamHx:?      Family History         Problem  Relation  Age of Onset          ?  Cancer  Mother       ?  Hypertension  Mother       ?  Hypertension  Sister  x3          ?  Heart Surgery  Sister       ?  Heart Disease  Sister       ?  Heart Attack  Sister       ?  Cancer  Maternal Aunt                breast          ?  Breast Cancer  Maternal Aunt       ?  Glaucoma  Son       ?  Heart defect  Son       ?  Diabetes  Paternal Uncle            ?  Hypertension  Paternal Uncle            Current Medications:       Current Outpatient Medications          Medication  Sig  Dispense  Refill           ?  omeprazole (PRILOSEC) 40 mg capsule  Take 40 mg by mouth daily.         ?  valsartan-hydroCHLOROthiazide (DIOVAN-HCT) 160-25 mg per tablet  Take 1 Tablet by mouth daily.  30 Tablet  0     ?  gabapentin (NEURONTIN) 300 mg capsule  TAKE 1 CAPSULE EVERY MORNING AND TAKE 2 CAPSULES EVERY NIGHT (MAX DAILY AMOUNT : '900MG'$ )  270 Capsule  1     ?  atorvastatin (LIPITOR) 40 mg tablet  TAKE 1 TABLET EVERY DAY (NEED MD APPOINTMENT)  90 Tablet  1     ?  montelukast (Singulair) 10 mg tablet  Take 1 Tablet by mouth daily.  90 Tablet  3     ?  levothyroxine (SYNTHROID) 150 mcg tablet  TAKE 1 TABLET EVERY DAY BEFORE BREAKFAST  90 Tablet  0     ?  cyanocobalamin (VITAMIN B12) 100 mcg tablet  Take 100 mcg by mouth daily.          ?  ZINC PO  Take  by mouth.               ?  albuterol (PROVENTIL HFA, VENTOLIN HFA, PROAIR HFA) 90 mcg/actuation inhaler  TAKE 2 PUFFS BY INHALATION EVERY FOUR (4) HOURS AS NEEDED FOR WHEEZING.  1 Inhaler  5           ?  potassium chloride (KLOR-CON) 10 mEq tablet  TAKE 1 TABLET EVERY DAY  90 Tablet  3     ?  vit B complx C/folic acid/zinc (VIT B COMPLEX-C-FOLIC AC-ZINC PO)  Take 1 Tab by mouth daily.         ?  latanoprost (XALATAN) 0.005 % ophthalmic solution  Administer 1 Drop to both eyes nightly.         ?  multivitamin (ONE A DAY) tablet  Take 1 Tab by mouth daily.         ?  cholecalciferol (VITAMIN D3) (5000 Units/125 mcg) tab tablet  Take  by mouth daily.         ?  raNITIdine hcl 150 mg capsule  Take 150 mg by mouth nightly.         ?  benzonatate (TESSALON) 100 mg capsule           ?  pantoprazole (PROTONIX) 40 mg tablet           ?  terbinafine HCL (LAMISIL) 250 mg tablet  TAKE 1 TABLET EVERY DAY  90 Tablet  0     ?  coffee xt/phosphatidyl serine (NEURIVA ORIGINAL PO)  Take  by mouth.               ?  OTHER  1 Tab daily. Align Probiotic             Allergies:       Allergies        Allergen  Reactions         ?  Aspirin  Other (comments)             GI distress,          ?  Asa-Acetaminophen-Caff-Potass  Other (comments)             Bleeding in stomach         ?  Benadryl [Diphenhydramine Hcl]  Other (comments)             Muscle jerking         ?  Other Plant, Higher education careers adviser, Environmental  Not Reported This Time             Grass, Dust and Mold         ?  Pollen Extracts  Not Reported This Time            Review of Systems:    Gen:    Denied fevers, chills, malaise, fatigue, weight changes    Resp: Denied shortness of breath, cough, wheezing    CVS: Denied chest pain, palpitations    GU: Denied urinary urgency, frequency, incontinence    GI: Denied nausea, vomiting, constipation, diarrhea    Skin: Denied rashes, wounds    Psych: Denied anxiety, depression    Vasc: Denied claudication, ulcers     Hem: Denied easy bruising/bleeding    MSK: See HPI    Neuro: See HPI            Physical Exam       Vital Signs: Visit Vitals      BP  105/69 (BP 1 Location: Left upper arm, BP Patient Position: Sitting)     Pulse  86     Temp  97.6 ??F (36.4 ??C) (Temporal)     Ht  '5\' 2"'$  (1.575 m)     Wt  187 lb (84.8 kg)     SpO2  95%        BMI  34.20 kg/m??         General: ??????? Well nourished and well developed female without any acute distress    Psychiatric: ?  Alert and oriented x 3 with normal mood     HEENT: ????????  Atraumatic    Respiratory:   Breathing non-labored and non dyspneic    CV: ???????????????? Peripheral pulses intact, no peripheral edema    Skin: ?????????????  No rashes          Neurologic: ??        Sensation: normal and grossly intact thebilateral, lower extremity(s)    Strength: 5/5 in the bilateral, lower extremity(s)    Reflexes: reveals 2+ symmetric DTRs throughout     Gait: normal       Musculoskeletal: Lumbar Exam       Inspection:    Alignment: Normal   Atrophy: None          Tenderness to Palpation:    Lumbar paraspinals Positive   Lumbar spinous processes  Positive   SI Joint:  Negative   Gluteal:Negative    Greater trochanter: Negative         ROM:    Lumbar ROM: Normal   Lumbar facet loading: Negative   Hip ROM: No reproduction of pain with movement       Special Tests        Slump test: Positive left   SLR: Negative   FABER: Negative   FADIR: Negative   Log Roll: Negative         Medical Decision Making:     Images: The imaging results    Labs:  The results below were reviewed.        Prior imaging reviewed as above-   Lumbar spine 20111-DD lumbar spondylosis    L3/4 disc bulge- left foraminal narrowing L3,    L4/5 disc bulge severe facet arthritis, moderate left foraminal narrowing   L5/S1 left lateral recess narrowing , bilateral foraminal narrowing         Assessment:    -lumbar spondylosis   - lumbar facet arthritis      Plan:        -Physical therapy -  referral to PT   -Medications -  lidocaine patch. Counseled regarding side effects and appropriate administration of medications.     -Diagnostics/Imaging - consider repeat imaging lumbar spine    -Injections - NA    -Lifestyle - Encouraged regular aerobic exercise    -Education - The patient's diagnosis, prognosis and treatment options were discussed today. All questions were answered.    F/U - in 8 week(s) or sooner if needed.  Consider imaging lumbar spine             Anna-Christina Carren Rang MD   [facility]

## 2021-07-30 ENCOUNTER — Encounter

## 2021-07-30 MED ORDER — GABAPENTIN 300 MG CAP
300 mg | ORAL_CAPSULE | ORAL | 2 refills | Status: AC
Start: 2021-07-30 — End: ?

## 2021-07-30 MED ORDER — VALSARTAN-HYDROCHLOROTHIAZIDE 160 MG-25 MG TAB
160-25 mg | ORAL_TABLET | ORAL | 1 refills | Status: AC
Start: 2021-07-30 — End: ?

## 2021-07-30 MED ORDER — LEVOTHYROXINE 150 MCG TAB
150 mcg | ORAL_TABLET | ORAL | 0 refills | Status: DC
Start: 2021-07-30 — End: 2022-01-06

## 2021-07-30 MED ORDER — TERBINAFINE 250 MG TAB
250 mg | ORAL_TABLET | ORAL | 0 refills | Status: DC
Start: 2021-07-30 — End: 2021-11-04

## 2021-09-11 ENCOUNTER — Ambulatory Visit: Admit: 2021-09-11 | Discharge: 2021-09-11 | Payer: MEDICARE | Attending: Internal Medicine | Primary: Internal Medicine

## 2021-09-11 ENCOUNTER — Ambulatory Visit: Admit: 2021-09-11 | Discharge: 2021-09-11 | Payer: MEDICARE | Primary: Internal Medicine

## 2021-09-11 ENCOUNTER — Inpatient Hospital Stay: Admit: 2021-09-11 | Payer: MEDICARE | Primary: Internal Medicine

## 2021-09-11 ENCOUNTER — Ambulatory Visit: Attending: Internal Medicine | Primary: Internal Medicine

## 2021-09-11 DIAGNOSIS — M62838 Other muscle spasm: Secondary | ICD-10-CM

## 2021-09-11 MED ORDER — CYCLOBENZAPRINE 5 MG TAB
5 mg | ORAL_TABLET | Freq: Three times a day (TID) | ORAL | 0 refills | Status: AC | PRN
Start: 2021-09-11 — End: 2021-09-25

## 2021-09-11 NOTE — Progress Notes (Signed)
 Progress  Notes by Butler Niemann at 09/11/21 0830                Author: Butler Niemann  Service: --  Author Type: Medical Assistant       Filed: 09/11/21 1624  Encounter Date: 09/11/2021  Status: Signed          Editor: Butler Niemann (Medical Assistant)               Elaine [name] presents today for      Chief Complaint       Patient presents with        ?  Follow-up        Left leg gives way when walking worsening over   x 2 weeks       1. Have you been to the ER, urgent care clinic since your last visit?  Hospitalized since your last visit? no      2. Have you seen or consulted any other health care providers outside of the [facility] since your last visit? no       3. For patients aged 66-75: Has the patient had a colonoscopy / FIT/ Cologuard? Yes - no Care Gap present         If the patient is female:      4. For patients aged 74-74: Has the patient had a mammogram within the past 2 years? Yes - no Care Gap present   See top three      5. For patients aged 21-65: Has the patient had a pap smear? Yes - no Care Gap present   Elaine [name] presents today for

## 2021-09-11 NOTE — Progress Notes (Signed)
Progress  Notes by Celine Mans, MD at 09/11/21 0830                Author: Celine Mans, MD  Service: --  Author Type: Physician       Filed: 09/15/21 0756  Encounter Date: 09/11/2021  Status: Signed          Editor: Celine Mans, MD (Physician)               Please inform the patient that labs are within acceptable range.   Vitamin B12 is very high.   Recommend to stop B12 supplement.   Thanks

## 2021-09-11 NOTE — Progress Notes (Signed)
Progress  Notes by Celine Mans, MD at 09/11/21 0830                Author: Celine Mans, MD  Service: --  Author Type: Physician       Filed: 09/11/21 1624  Encounter Date: 09/11/2021  Status: Signed          Editor: Celine Mans, MD (Physician)               Elaine [name] is a 70 y.o. year old female who is a new patient to me today.  She is regularly followed by Dr. Madelyn Brunner, her PCP.        Subjective:     Elaine [name] was seen today for Leg Pain         Patient is complaining of pain in the left lower extremity, mainly in the thigh for last couple of weeks.      The pain comes and goes like a muscle spasm mainly in left thigh medially.  It makes her stop whatever she is doing and the leg gives way.  Otherwise there is no pain in low back or hips.  Patient has taken Tylenol extra strength for pain, it has helped  her.      No recent injury.  No rash.  No GI/GU symptoms.  No fever or chills.        Past Medical History:        Diagnosis  Date         ?  Anemia       ?  Asthma       ?  Colon polyp       ?  DDD (degenerative disc disease)  02/15/2009     ?  Depression  12/18/2011     ?  Essential hypertension, benign       ?  History of echocardiogram  03/20/2004          EF 70%.  Borderline DDfx.  No significant valvular pathology.         ?  Hypercholesterolemia       ?  Hypertension       ?  Iron deficiency anemia  02/15/2009     ?  Lichen planus  8/46/9629     ?  Lower extremity venous duplex  10/23/2009          Left leg:  No DVT.           ?  Meralgia paraesthetica  10/22/2013     ?  OA (osteoarthritis of spine)  02/15/2009     ?  OA (osteoarthritis of spine)  02/15/2009     ?  Obesity, unspecified       ?  Other and unspecified hyperlipidemia       ?  Pre-operative cardiovascular examination            For spine surgery         ?  Right sided sciatica       ?  Shortness of breath            Possible asthma, HCVD; less likely CAD (Noted 03/15/09)         ?  Sleep apnea  02/15/2009          uses cpap  machine         ?  Thallium stress test   03/20/2004          Partially transient, mod  basal & mid anterior defect most c/w artifact; mild anterior ischemia less likely.  Neg EKG on pharm stress test.         ?  Thyroid disease            hypothyroidism             Past Surgical History:         Procedure  Laterality  Date          ?  COLONOSCOPY  N/A  04/25/2020          SCREENING COLONOSCOPY performed by Sloan Leiter, MD at [facility] ENDOSCOPY          ?  HX COLONOSCOPY    02-24-12          normal (Dr. Angelique Holm)          ?  HX HERNIA REPAIR              3x          ?  HX HYSTERECTOMY         ?  HX TUBAL LIGATION         ?  NEUROLOGICAL PROCEDURE UNLISTED    03-16-2009          s/p ACD & Fusion (Dr.P.Gurtner)          ?  PR COLONOSCOPY FLX DX W/COLLJ SPEC WHEN PFRMD    02-2007          +polyp(tubular adenoma); Dr Angelique Holm             Family History         Problem  Relation  Age of Onset          ?  Cancer  Mother       ?  Hypertension  Mother       ?  Hypertension  Sister                x3          ?  Heart Surgery  Sister       ?  Heart Disease  Sister       ?  Heart Attack  Sister       ?  Cancer  Maternal Aunt                breast          ?  Breast Cancer  Maternal Aunt       ?  Glaucoma  Son       ?  Heart defect  Son       ?  Diabetes  Paternal Uncle            ?  Hypertension  Paternal Uncle               Social History          Socioeconomic History         ?  Marital status:  DIVORCED              Spouse name:  Not on file         ?  Number of children:  Not on file     ?  Years of education:  Not on file     ?  Highest education level:  Not on file       Occupational History        ?  Not on file  Tobacco Use         ?  Smoking status:  Former              Packs/day:  1.00         Years:  28.00         Pack years:  28.00         Types:  Cigarettes         Start date:  10/16/1977         Quit date:  04/25/2004         Years since quitting:  17.3         ?  Smokeless tobacco:  Never       Substance and Sexual  Activity         ?  Alcohol use:  No     ?  Drug use:  No     ?  Sexual activity:  Not on file        Other Topics  Concern        ?  Not on file       Social History Narrative        ?  Not on file          Social Determinants of Health          Financial Resource Strain: Not on file     Food Insecurity: Not on file     Transportation Needs: Not on file     Physical Activity: Not on file     Stress: Not on file     Social Connections: Not on file     Intimate Partner Violence: Not on file       Housing Stability: Not on file             Allergies        Allergen  Reactions         ?  Aspirin  Other (comments)             GI distress,          ?  Asa-Acetaminophen-Caff-Potass  Other (comments)             Bleeding in stomach         ?  Benadryl [Diphenhydramine Hcl]  Other (comments)             Muscle jerking         ?  Other Plant, Higher education careers adviser, Environmental  Not Reported This Time             Grass, Dust and Mold         ?  Pollen Extracts  Not Reported This Time             Current Outpatient Medications on File Prior to Visit          Medication  Sig  Dispense  Refill           ?  levothyroxine (SYNTHROID) 150 mcg tablet  TAKE 1 TABLET EVERY DAY BEFORE BREAKFAST (NEED MD APPOINTMENT)  90 Tablet  0     ?  terbinafine HCL (LAMISIL) 250 mg tablet  TAKE 1 TABLET EVERY DAY  90 Tablet  0     ?  gabapentin (NEURONTIN) 300 mg capsule  TAKE 1 CAPSULE EVERY MORNING AND TAKE 2 CAPSULES EVERY NIGHT  270 Capsule  2     ?  valsartan-hydroCHLOROthiazide (DIOVAN-HCT) 160-25 mg per tablet  TAKE 1 TABLET  EVERY DAY  90 Tablet  1     ?  omeprazole (PRILOSEC) 40 mg capsule  Take 40 mg by mouth daily.               ?  lidocaine (LIDODERM) 5 %  1 Patch by TransDERmal route every twenty-four (24) hours. 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  atorvastatin (LIPITOR) 40 mg tablet  TAKE 1 TABLET EVERY DAY (NEED MD APPOINTMENT)  90 Tablet  1     ?  montelukast (Singulair) 10 mg tablet  Take 1 Tablet by mouth daily.  90  Tablet  3     ?  cyanocobalamin (VITAMIN B12) 100 mcg tablet  Take 100 mcg by mouth daily.         ?  ZINC PO  Take  by mouth.         ?  coffee xt/phosphatidyl serine (NEURIVA ORIGINAL PO)  Take  by mouth.         ?  albuterol (PROVENTIL HFA, VENTOLIN HFA, PROAIR HFA) 90 mcg/actuation inhaler  TAKE 2 PUFFS BY INHALATION EVERY FOUR (4) HOURS AS NEEDED FOR WHEEZING.  1 Inhaler  5     ?  potassium chloride (KLOR-CON) 10 mEq tablet  TAKE 1 TABLET EVERY DAY  90 Tablet  3     ?  vit B complx C/folic acid/zinc (VIT B COMPLEX-C-FOLIC AC-ZINC PO)  Take 1 Tab by mouth daily.         ?  latanoprost (XALATAN) 0.005 % ophthalmic solution  Administer 1 Drop to both eyes nightly.         ?  multivitamin (ONE A DAY) tablet  Take 1 Tab by mouth daily.         ?  cholecalciferol (VITAMIN D3) (5000 Units/125 mcg) tab tablet  Take  by mouth daily.               ?  raNITIdine hcl 150 mg capsule  Take 150 mg by mouth nightly. (Patient not taking: Reported on 09/11/2021)              No current facility-administered medications on file prior to visit.            ROS as above              Objective:          Vitals:           09/11/21 0931  09/11/21 0937         BP:  (!) 146/61  (!) 151/64     Pulse:  68       Resp:  16       Temp:  98.1 ??F (36.7 ??C)       TempSrc:  Temporal       SpO2:  96%       Weight:  188 lb (85.3 kg)           Height:  5\' 2"  (1.575 m)           Physical Exam   Cardiovascular :       Rate and Rhythm: Normal rate and regular rhythm.       Pulses: Normal pulses.       Heart sounds: Normal heart sounds.    Pulmonary:       Effort: Pulmonary effort is normal.  Breath sounds: Normal breath sounds.     Abdominal:       General: Abdomen is flat.       Palpations: Abdomen is soft.     Musculoskeletal:          General: No swelling, tenderness, deformity or signs of injury. Normal range of motion.       Right lower leg: No edema.       Left lower leg: No edema.                Labs:          Results for orders placed or  performed during the hospital encounter of 54/09/81     METABOLIC PANEL, BASIC         Result  Value  Ref Range            Sodium  140  136 - 145 mmol/L       Potassium  4.0  3.5 - 5.5 mmol/L       Chloride  104  100 - 111 mmol/L       CO2  34 (H)  21 - 32 mmol/L       Anion gap  2 (L)  3.0 - 18 mmol/L       Glucose  95  74 - 99 mg/dL       BUN  9  7.0 - 18 MG/DL       Creatinine  0.51 (L)  0.6 - 1.3 MG/DL       BUN/Creatinine ratio  18  12 - 20         GFR est AA  >60  >60 ml/min/1.71m2       GFR est non-AA  >60  >60 ml/min/1.63m2       Calcium  9.3  8.5 - 10.1 MG/DL       CBC WITH AUTOMATED DIFF         Result  Value  Ref Range            WBC  6.1  4.6 - 13.2 K/uL       RBC  3.60 (L)  4.20 - 5.30 M/uL       HGB  9.5 (L)  12.0 - 16.0 g/dL       HCT  30.9 (L)  35.0 - 45.0 %       MCV  85.8  78.0 - 100.0 FL       MCH  26.4  24.0 - 34.0 PG       MCHC  30.7 (L)  31.0 - 37.0 g/dL       RDW  15.9 (H)  11.6 - 14.5 %       PLATELET  281  135 - 420 K/uL       MPV  10.9  9.2 - 11.8 FL       NRBC  0.0  0 PER 100 WBC       ABSOLUTE NRBC  0.00  0.00 - 0.01 K/uL       NEUTROPHILS  78 (H)  40 - 73 %       LYMPHOCYTES  12 (L)  21 - 52 %       MONOCYTES  7  3 - 10 %       EOSINOPHILS  2  0 - 5 %       BASOPHILS  1  0 - 2 %       IMMATURE GRANULOCYTES  0  0.0 - 0.5 %       ABS. NEUTROPHILS  4.7  1.8 - 8.0 K/UL       ABS. LYMPHOCYTES  0.8 (L)  0.9 - 3.6 K/UL       ABS. MONOCYTES  0.4  0.05 - 1.2 K/UL       ABS. EOSINOPHILS  0.1  0.0 - 0.4 K/UL       ABS. BASOPHILS  0.1  0.0 - 0.1 K/UL       ABS. IMM. GRANS.  0.0  0.00 - 0.04 K/UL       DF  AUTOMATED          NT-PRO BNP         Result  Value  Ref Range            NT pro-BNP  124  0 - 900 PG/ML                 Active Problems:          Patient Active Problem List          Diagnosis        ?  Sore throat     ?  Memory problem     ?  Severe obesity (BMI 35.0-39.9) with comorbidity ([place])     ?  Chronic diastolic congestive heart failure ([place])             2/18 NYHA2, 70-75%EF, add coreg            ?  Hospital discharge follow-up     ?  Chest pain     ?  Routine general medical examination at a health care facility     ?  Need for pneumococcal vaccination     ?  URI (upper respiratory infection)     ?  Cough     ?  Encounter for long-term (current) use of medications     ?  Chronic midline low back pain without sciatica     ?  Rash     ?  Claustrophobia     ?  Migraine without aura and without status migrainosus, not intractable     ?  Dehydration     ?  Chiari I malformation ([place])     ?  Post-menopausal     ?  Osteoarthritis of left knee     ?  Left knee pain     ?  Swelling of left lower extremity     ?  Need for hepatitis C screening test     ?  Glaucoma screening     ?  Abnormal stress test     ?  Obesity (BMI 30-39.9)             Weight loss has been strongly encouraged by following dietary restrictions and an exercise routine.           ?  Pre-diabetes     ?  Meralgia paraesthetica     ?  Depression     ?  Sciatica     ?  Submental lymphadenopathy     ?  DOE (dyspnea on exertion)     ?  Hypothyroidism     ?  Pure hypercholesterolemia             chk lipids           ?  HTN (hypertension), benign  controlled           ?  GERD (gastroesophageal reflux disease)     ?  Asthma     ?  Vitamin D deficiency     ?  Iron deficiency anemia             F/u PCP, get IV iron off & on                Assessment & Plan:        Diagnoses and all orders for this visit:      1. Muscle spasm of left lower extremity   Assessment & Plan:          Orders:   -     VITAMIN B12; Future   -     VITAMIN D, 25 HYDROXY; Future   -     CBC WITH AUTOMATED DIFF; Future   -     METABOLIC PANEL, BASIC; Future   -     cyclobenzaprine (FLEXERIL) 5 mg tablet; Take 0.5 Tablets by mouth three (3) times daily as needed for Muscle Spasm(s) for up to 14 days.      2. Left leg pain   -     VITAMIN B12; Future   -     VITAMIN D, 25 HYDROXY; Future   -     CBC WITH AUTOMATED DIFF; Future   -     METABOLIC PANEL, BASIC; Future   -      cyclobenzaprine (FLEXERIL) 5 mg tablet; Take 0.5 Tablets by mouth three (3) times daily as needed for Muscle Spasm(s) for up to 14 days.      3. Needs flu shot   -     INFLUENZA, FLUAD, (AGE 78 Y+), IM, PF, 0.5 ML           Follow-up and Dispositions      ??  Return in about 2 weeks (around 09/25/2021).                               Disclaimer:      The patient understands our medical plan.  Alternatives have been explained and offered.  The risks, benefits and significant side effects of all medications have been reviewed. Anticipated time course and progression of condition reviewed. All questions  have been addressed.  She is encouraged to employ the information provided in the after visit summary, which was reviewed.        Where applicable, she is instructed to call the clinic if she has not been notified either by phone or through [facility] with the results of her tests or with an appointment plan for any referrals within 1 week(s).  No news is not good news; it's no news.  The patient  is to call if her condition worsens or fails to improve or if significant side effects are experienced.                Celine Mans, MD

## 2021-09-11 NOTE — Addendum Note (Signed)
Addendum  Note by Celine Mans, MD at 09/11/21 0830                Author: Celine Mans, MD  Service: --  Author Type: Physician       Filed: 09/15/21 0756  Encounter Date: 09/11/2021  Status: Signed          Editor: Celine Mans, MD (Physician)          Addended by: Celine Mans on: 09/15/2021 07:56 AM    Modules accepted: Orders

## 2021-09-11 NOTE — Assessment & Plan Note (Signed)
Muscle spasm of left lower extremity - Assessment & Plan Note by Celine Mans, MD at 09/11/21 1022                Author: Celine Mans, MD  Service: --  Author Type: Physician       Filed: 09/11/21 1022  Encounter Date: 09/11/2021  Status: Written          Editor: Celine Mans, MD (Physician)

## 2021-09-11 NOTE — Progress Notes (Signed)
I called the patient and advised. Patient verbalized understanding.

## 2021-09-11 NOTE — Other (Signed)
Therapy Evaluation by Joline Salt, PT at 09/11/21 1630                Author: Joline Salt, PT  Service: Physical Therapy  Author Type: Physical Therapist       Filed: 09/12/21 0927  Date of Service: 09/11/21 1630  Status: Signed           Editor: Joline Salt, PT (Physical Therapist)  Cosigner: Charm Barges, MD at 09/14/21 2203               [facility]   [address] [place] [address]   [phone]  [phone] fax      Plan of Care/ Statement of Necessity for Physical Therapy Services         Patient name: Elaine [name]  Start of Care: 09/11/2021        Referral source: Carney Living*  DOB: 04-22-1951      Medical Diagnosis: Other low back pain [M54.59]   Payor: HU[employer]NA MEDICARE / Plan: [place] HMO / Product Type: Managed Care Medicare /   Onset Date: 15 years ago with worsening symptoms over the past year        Treatment Diagnosis: LBP        Prior Hospitalization: see medical history  Provider#: [id number]       Medications: Verified on Patient summary List      Comorbidities: chiari malformation, arthritis, HTN, asthma    Prior Level of Function: ambulation with SPC, Ind with ADLs, working as a Economist of Care and following information is based on the information from the initial evaluation.   Assessment/ key information: pt. Is a 70 year old female c/o back pain that has gradually worsened, especially over the past year. She also reports  left groin pain over the past 2 weeks. She has increased pain with standing/walking and difficulty standing up straight. She also reports difficulty getting up from ground at work. She has less pain with walking with grocery cart. She presents with significant  decrease in lumbar extension AROM to neutral position with pain. No myotome involvement was noted but she has decreased B hip strength. She also had negative neural  tension testing. She has decreased left hip ER AROM at 10 degrees compared to 20 degrees.  She has decreased B hip flexor flexibility with positive Ely tests. Positive left hip flex/add/IR test. Skilled PT is medically necessary in order to improve B hip strength and flexibility for increased ease of ambulation and improved quality of life.       Evaluation Complexity History MEDIUM  Complexity : 1-2 comorbidities / personal factors  will impact the outcome/ POC ; Examination MEDIUM Complexity : 3 Standardized tests and measures addressing body structure, function, activity  limitation and / or participation in recreation  ;Presentation LOW Complexity : Stable, uncomplicated  ; Clinical Decision Making MEDIUM Complexity : FOTO score of 26-74   Overall Complexity Rating: LOW    Problem List: pain affecting function, decrease ROM, decrease strength, impaired gait/ balance, decrease ADL/ functional abilitiies, decrease activity  tolerance, decrease flexibility/ joint mobility, and decrease transfer abilities    Treatment Plan may include any combination of the following: Therapeutic exercise, Therapeutic activities, Neuromuscular re-education, Physical  agent/modality, Gait/balance training, Manual therapy, Patient education, Self Care training, Functional mobility training, Home safety training, and Stair training   Patient / Family  readiness to learn indicated by: asking questions   Persons(s) to be included in education: patient (P)   Barriers to Learning/Limitations: None   Patient Goal (s): to have less pain   Patient Self Reported Health Status: fair   Rehabilitation Potential: good      Short Term Goals: To be accomplished in 1 weeks:   1.  Patient will demonstrate compliance with HEP in order to improve LE strength for increased ease of ambulation.        Long Term Goals: To be accomplished in 4 weeks:   1.  Goal: Patient will improve FOTO score by 21 points in order to demonstrate a significant improvement in  function.    Status at evaluation/last progress note: 39 points      2.   Goal: Patient will improve B hip extension MMT to 4-/5 in order to increase ease of ambulation.    Status at evaluation/last progress note: B: 3/5      3.   Goal: Patient will improve left hip left hip ER/IR AROM to 20 degrees in order to increase ease of transfers.    Status at evaluation/last progress note: IR: 18 degrees ER: 10 degrees      4.   Goal: Patient will report a 50% improvement in symptoms since [facility] in order to improve quality of life.    Status at evaluation/last progress note: n/a      Frequency / Duration: Patient to be seen 2 times per week for 4 weeks.      Patient/ Caregiver education and instruction: Diagnosis, prognosis, exercises    [x]   Plan of care has been reviewed with PTA      Certification Period: 09/11/21-10/11/21      Joline Salt, PT 09/11/2021 7:08 PM      ________________________________________________________________________      I certify that the above Therapy Services are being furnished while the patient is under my care. I agree with the treatment plan and certify that this therapy is necessary.      Physician's Signature:____________Date :_________TIME:________      Carney Living*   ** Signature, Date and Time must be completed for valid certification **      Please sign and return to [facility]   [address] [place] [address]   [phone]  [phone] fax

## 2021-09-11 NOTE — Progress Notes (Signed)
Progress Notes by Joline Salt, PT at 09/11/21 1630                Author: Joline Salt, PT  Service: Physical Therapy  Author Type: Physical Therapist       Filed: 09/11/21 1908  Date of Service: 09/11/21 1630  Status: Signed          Editor: Joline Salt, PT (Physical Therapist)               PT DAILY TREATMENT NOTE/LUMBAR EVAL 11-20      Patient Name: Elaine [name]   Date:09/11/2021   DOB: [birth year]   [x]   Patient DOB Verified   Payor: HUMANA MEDICARE / Plan: [place] HMO / Product Type: Managed Care Medicare /     In time: 4:30  Out time: 5:10   Total Treatment Time (min): 40   Visit #: 1 of 8        Medicare/BCBS Only        Total Timed Codes (min):  8  1:1 Treatment Time:  40        Treatment Area: Other low back pain [M54.59]   SUBJECTIVE   Pain Level (0-10 scale): 3-4/10   [] constant [] intermittent []  improving [] worsening [] no change since onset      Any medication changes, allergies to medications, adverse drug reactions, diagnosis change, or new procedure performed?: [x]   No    []  Yes (see summary sheet for update)   Subjective functional status/changes:         Mechanism of Injury: pt. Reports when she walks she gets a sharp pain in her hip and her leg gives out (2 weeks ago). Back pain began 15 years ago. Has worsened over time. Worse with rain. Back pain has gotten worse over the past year. Standing increases  pain. Pain with standing and watching kids during recess. Some difficulty getting back up from floor at work. Pain with standing up straight. Less pain with walking with grocery cart. No pain with sitting. Denies numbness/tingling. Back has been better  since the hip started having pain   Work Hx: Oncologist.    Pt Goals: to stand up straighter and have less pain      OBJECTIVE/EXAMINATION          8  min  Therapeutic Exercise:  []   See flow sheet : HEP     Rationale: increase ROM and increase strength to improve the patient s ability to  perform ADLs               With    [x]   TE    []  TA    []  neuro    []  other:  Patient Education: [x]   Review HEP     []  Progressed/Changed HEP based on:    []  positioning   []  body mechanics   []   transfers   []  heat/ice application     []  other:         Physical Therapy Evaluation - Lumbar Spine ([facility])      OBJECTIVE      Gait:  []  Normal     [x]  Abnormal: SPC, excessive fwd flexion, decreased weight shift to left side      Active Movements: []  N/A   []  Too acute   []   Other:        ROM  % AROM  % PROM  Comments:pain, area          Forward  flexion 40-60  100              Extension 20-30  0              SB right 20-30  50         SB left 20-30  50         Rotation right 5-10  50              Rotation left 5-10  50            Dural Mobility:   SLR Sitting: []   R    []  L    []  +    []   -  @ (degrees):            Supine: [x]  R    [x]  L    []   +    [x]  -  @ (degrees):    Slump Test: [x]  R    [x]  L    []   +    [x]  -  @ (degrees):    Prone Knee Bend: []  R    []  L    []   +    []  -       Palpation: no tenderness to palpation   []  Min  []  Mod  []   Severe    Location:   []  Min  []  Mod  []   Severe    Location:   []  Min  []  Mod  []   Severe    Location:      Strength          L(0-5)  R (0-5)  N/T          Hip Flexion (L1,2)  4-  4-  []      Knee Extension (L3,4)  5  5  []      Ankle Dorsiflexion (L4)  4  4  []      Great Toe Extension (L5)      []      Ankle Plantarflexion (S1)  4  4  []      Knee Flexion (S1,2)  4+  4+  []      Upper Abdominals      []      Lower Abdominals      []      Paraspinals      []      Back Rotators      []      Gluteus Maximus  3  3  []           Hip abd  4-  4-  []         Other tests/comments:   Hip IR: right: 20 left: 18 ER: right: 18 left: 10   30 second sit to stand test: 10x  Decreased B hip flexor flexibility   Positive Ely test   Positive hip flex/add/IR test on left side          Pain Level (0-10 scale) post treatment: 3-4/10      ASSESSMENT/Changes in Function:        [x]   See Plan of Care   []    See progress note/recertification   []    See Discharge Summary           Progress towards goals / Updated goals:   See POC      PLAN   []   Upgrade activities as tolerated     [x]   Continue plan of care   []    Update interventions per flow sheet        []   Discharge due to:_   []   Other:_        Joline Salt, PT 09/11/2021  4:29 PM

## 2021-09-12 LAB — VITAMIN B12
Vitamin B-12: >2000 pg/mL — ABNORMAL HIGH (ref 232–1245)
Vitamin B12: >2000 pg/mL — ABNORMAL HIGH (ref 232–1245)

## 2021-09-12 LAB — CBC WITH AUTO DIFFERENTIAL
Basophils %: 1 %
Basophils Absolute: 0 10*3/uL (ref 0.0–0.2)
Eosinophils %: 2 %
Eosinophils Absolute: 0.1 10*3/uL (ref 0.0–0.4)
Granulocyte Absolute Count: 0 10*3/uL (ref 0.0–0.1)
Hematocrit: 34 % (ref 34.0–46.6)
Hemoglobin: 11.1 g/dL (ref 11.1–15.9)
Immature Granulocytes %: 0 %
Lymphocytes %: 18 %
Lymphocytes Absolute: 0.8 10*3/uL (ref 0.7–3.1)
MCH: 26.2 pg — ABNORMAL LOW (ref 26.6–33.0)
MCHC: 32.6 g/dL (ref 31.5–35.7)
MCV: 80 fL (ref 79–97)
Monocytes %: 8 %
Monocytes Absolute: 0.4 10*3/uL (ref 0.1–0.9)
Neutrophils %: 71 %
Neutrophils Absolute: 3.4 10*3/uL (ref 1.4–7.0)
Platelets: 312 10*3/uL (ref 150–450)
RBC: 4.24 x10E6/uL (ref 3.77–5.28)
RDW: 15.7 % — ABNORMAL HIGH (ref 11.7–15.4)
WBC: 4.6 10*3/uL (ref 3.4–10.8)

## 2021-09-12 LAB — BASIC METABOLIC PANEL
BUN/Creatinine Ratio: 10 NA — ABNORMAL LOW (ref 12–28)
BUN: 6 mg/dL — ABNORMAL LOW (ref 8–27)
CO2: 28 mmol/L (ref 20–29)
Calcium: 10.2 mg/dL (ref 8.7–10.3)
Chloride: 98 mmol/L (ref 96–106)
Creatinine: 0.59 mg/dL (ref 0.57–1.00)
Est, Glom Filt Rate: 96 mL/min/1.73 (ref >59)
Glucose: 80 mg/dL (ref 70–99)
Potassium: 3.9 mmol/L (ref 3.5–5.2)
Sodium: 141 mmol/L (ref 134–144)

## 2021-09-12 LAB — VITAMIN D 25 HYDROXY: Vit D, 25-Hydroxy: 53.5 ng/mL (ref 30.0–100.0)

## 2021-09-12 LAB — CBC WITH AUTOMATED DIFF
ABS. BASOPHILS: 0 10*3/uL (ref 0.0–0.2)
ABS. EOSINOPHILS: 0.1 10*3/uL (ref 0.0–0.4)
ABS. IMM. GRANS.: 0 10*3/uL (ref 0.0–0.1)
ABS. MONOCYTES: 0.4 10*3/uL (ref 0.1–0.9)
ABS. NEUTROPHILS: 3.4 10*3/uL (ref 1.4–7.0)
Abs Lymphocytes: 0.8 10*3/uL (ref 0.7–3.1)
BASOPHILS: 1 %
EOSINOPHILS: 2 %
HCT: 34 % (ref 34.0–46.6)
HGB: 11.1 g/dL (ref 11.1–15.9)
IMMATURE GRANULOCYTES: 0 %
Lymphocytes: 18 %
MCH: 26.2 pg — ABNORMAL LOW (ref 26.6–33.0)
MCHC: 32.6 g/dL (ref 31.5–35.7)
MCV: 80 fL (ref 79–97)
MONOCYTES: 8 %
NEUTROPHILS: 71 %
PLATELET: 312 10*3/uL (ref 150–450)
RBC: 4.24 x10E6/uL (ref 3.77–5.28)
RDW: 15.7 % — ABNORMAL HIGH (ref 11.7–15.4)
WBC: 4.6 10*3/uL (ref 3.4–10.8)

## 2021-09-12 LAB — METABOLIC PANEL, BASIC
BUN/Creatinine ratio: 10 — ABNORMAL LOW (ref 12–28)
BUN: 6 mg/dL — ABNORMAL LOW (ref 8–27)
CO2: 28 mmol/L (ref 20–29)
Calcium: 10.2 mg/dL (ref 8.7–10.3)
Chloride: 98 mmol/L (ref 96–106)
Creatinine: 0.59 mg/dL (ref 0.57–1.00)
Glucose: 80 mg/dL (ref 70–99)
Potassium: 3.9 mmol/L (ref 3.5–5.2)
Sodium: 141 mmol/L (ref 134–144)
eGFR: 96 mL/min/{1.73_m2} (ref >59)

## 2021-09-12 LAB — VITAMIN D, 25 HYDROXY: VITAMIN D, 25-HYDROXY: 53.5 ng/mL (ref 30.0–100.0)

## 2021-09-12 NOTE — Telephone Encounter (Signed)
Letter done. Pt can pick up

## 2021-09-12 NOTE — Telephone Encounter (Signed)
I called the patient and left a VM to advise the letter is up front for pick up.

## 2021-09-12 NOTE — Telephone Encounter (Signed)
Pt calling says she was here yesterday for an office visit. Says she needs a note to be out of work yesterday and today.    Says today she still can't put weight on her leg.    Call her to pick up

## 2021-09-15 NOTE — Telephone Encounter (Signed)
Patient contacted, patient identified with two identifiers (Name & DOB).  Patient aware of results per DR.Chawla and verbalizes understanding.

## 2021-09-15 NOTE — Telephone Encounter (Signed)
-----   Message from Lorette Ang sent at 09/15/2021 11:08 AM EDT -----  Subject: Message to Provider    QUESTIONS  Information for Provider? Patient needs note to be off work until issue   with leg is resolved. please call patient and advise.  ---------------------------------------------------------------------------  --------------  Rod Can INFO  [phone]; OK to leave message on voicemail  ---------------------------------------------------------------------------  --------------  SCRIPT ANSWERS  Relationship to Patient? Self

## 2021-09-15 NOTE — Telephone Encounter (Signed)
She can be out until 10/17. If not improving she can make f/up appt.

## 2021-09-15 NOTE — Telephone Encounter (Signed)
Pt notified and letter printed

## 2021-09-15 NOTE — Telephone Encounter (Signed)
-----   Message from Celine Mans, MD sent at 09/15/2021  7:56 AM EDT -----  Please inform the patient that labs are within acceptable range.  Vitamin B12 is very high.  Recommend to stop B12 supplement.  Thanks

## 2021-09-15 NOTE — Telephone Encounter (Signed)
Patient states she can not still put weight on her leg and will need a work note until her leg is better.      Patient states her leg starts hurting when she is walking, and states her leg does not hurt as long as she is sitting.     Please advise if another appointment is needed.

## 2021-09-18 NOTE — Telephone Encounter (Signed)
-----   Message from Ronnette Hila sent at 09/18/2021 11:53 AM EDT -----  Subject: Message to Provider    QUESTIONS  Information for Provider? Pt states she is out of work until the 17th due   to leg pain, she has been on a muscle relaxer but it doesn't seem to be   working and she is unable to put weight back on that leg, she would like   to see if her work note can be extended.   ---------------------------------------------------------------------------  --------------  Rod Can INFO  [phone]; OK to leave message on voicemail  ---------------------------------------------------------------------------  --------------  SCRIPT ANSWERS  Relationship to Patient? Self

## 2021-09-19 ENCOUNTER — Inpatient Hospital Stay: Admit: 2021-09-19 | Payer: MEDICARE | Primary: Internal Medicine

## 2021-09-19 NOTE — Telephone Encounter (Signed)
Patient is aware and will pick up work note.

## 2021-09-19 NOTE — Telephone Encounter (Signed)
Can extend work note to October 31

## 2021-09-19 NOTE — Progress Notes (Signed)
 PT DAILY TREATMENT NOTE    Patient Name: Elaine [name]  Date:09/19/2021  DOB: 12-05-1951  [x]   Patient DOB Verified  Payor: HU[employer]NA MEDICARE / Plan: [employer] MEDICARE HMO / Product Type: Managed Care Medicare /    In time: 3:42  Out time: 4:25  Total Treatment Time (min): 43  Visit #: 2 of 8    Medicare/BCBS Only   Total Timed Codes (min): 43 1:1 Treatment Time: 43     Treatment Area: Other low back pain [M54.59]  SUBJECTIVE  Pain Level (0-10 scale): 0/10 sitting, 8/10 weightbearing/left groin  [] constant [] intermittent [] improving [] worsening [] no change since onset    Any medication changes, allergies to medications, adverse drug reactions, diagnosis change, or new procedure performed?: [x]  No    []  Yes (see summary sheet for update)  Subjective functional status/changes:     Patient reports no pain with sitting only with weightbearing. She has a question about one of the exercises she does on her stomach.     OBJECTIVE    23 min Therapeutic Exercise:  []  See flow sheet :   Rationale: increase ROM and increase strength to improve the patient's ability to perform ADLs.    12 min Therapeutic Activity:  []  See flow sheet : step ups and lunges   Rationale: increase ROM, increase strength and improve coordination to improve the patient's ability to perform proper posture, bed mobility and transfers without p!     8 min  Neuromuscular Re-education:  []  See flow sheet : SB#1, KZ paloff   Rationale: Rationale: increase ROM, increase strength and increase proprioception  to improve the patient's ability to improve postural stability.           With   [x]  TE   []  TA   []  neuro   []  other: Patient Education: [x]  Review HEP    []  Progressed/Changed HEP based on:   []  positioning   []  body mechanics   []  transfers   []  heat/ice application    []  other:      Other Functional Tests and Measures:   - increased challenge with left UE/right LE lift in prone versus opposite  - CGA for KZ pallof press  - TC at hips in prone to  prevent hip rotation compensation    Pain Level (0-10 scale) post treatment: 7/10 left groin    ASSESSMENT/Changes in Function:   Initiated PT POC today per flow sheet, requiring vc and demo 100% of the time for proper form and technique with TE.  Minor decrease in left groin pain at end of session and declined modalities. Patient with poor to fair standing balance and requires CGA for standing Keiser activities.      [x]   See Plan of Care  []   See progress note/recertification  []   See Discharge Summary         Progress towards goals / Updated goals:  Short Term Goals: To be accomplished in 1 weeks:  Patient will demonstrate compliance with HEP in order to improve LE strength for increased ease of ambulation.   Current: compliant (09/19/21)     Long Term Goals: To be accomplished in 4 weeks:  Goal: Patient will improve FOTO score by 21 points in order to demonstrate a significant improvement in function.   Status at evaluation/last progress note: 39 points     2.   Goal: Patient will improve B hip extension MMT to 4-/5 in order to increase ease of ambulation.  Status at evaluation/last progress note: B: 3/5     3.   Goal: Patient will improve left hip left hip ER/IR AROM to 20 degrees in order to increase ease of transfers.   Status at evaluation/last progress note: IR: 18 degrees ER: 10 degrees     4.   Goal: Patient will report a 50% improvement in symptoms since [facility] in order to improve quality of life.   Status at evaluation/last progress note: n/a    PLAN  []   Upgrade activities as tolerated     [x]   Continue plan of care  []   Update interventions per flow sheet       []   Discharge due to:_  [x]   Other: follow up on effects of first full session, assess for DOMS    Almarie DELENA Requena, PTA 09/19/2021  4:25 PM

## 2021-09-23 ENCOUNTER — Ambulatory Visit: Admit: 2021-09-23 | Discharge: 2021-09-23 | Payer: MEDICARE | Attending: Internal Medicine | Primary: Internal Medicine

## 2021-09-23 ENCOUNTER — Inpatient Hospital Stay: Admit: 2021-09-23 | Payer: MEDICARE | Primary: Internal Medicine

## 2021-09-23 ENCOUNTER — Ambulatory Visit: Attending: Internal Medicine | Primary: Internal Medicine

## 2021-09-23 DIAGNOSIS — M25552 Pain in left hip: Secondary | ICD-10-CM

## 2021-09-23 MED ORDER — PREDNISONE 10 MG TABLETS IN A DOSE PACK
10 mg | ORAL_TABLET | ORAL | 0 refills | Status: AC
Start: 2021-09-23 — End: 2021-11-04

## 2021-09-23 NOTE — Progress Notes (Signed)
Tell patient his/her Xray left hip shows mild arthritis

## 2021-09-23 NOTE — Progress Notes (Signed)
Elaine [name] is a 70 y.o.  female and presents with Leg Pain (F/u left leg pain/) and Hip Pain (Left )      SUBJECTIVE:    Hip Pain  Patient complains of left hip pain. Onset of the symptoms was 3 weeks ago. Inciting event: none. Current symptoms include left hip pain.  Severity = fairly severe  worse with weight bearing  aggravated by walking. Associated symptoms: left leg giving out. Aggravating symptoms: standing, walking, any weight bearing. Patient's overall course: symptoms have progressed to a point and plateaued.. Patient has had no prior hip problems. Previous visits for this problem: yes, last seen 2 weeks ago by Provider in this office . Evaluation to date: none.  Treatment to date: muscle relaxer with minimal improvement.    Respiratory ROS: negative for - shortness of breath  Cardiovascular ROS: negative for - chest pain    Current Outpatient Medications   Medication Sig    predniSONE (STERAPRED DS) 10 mg dose pack See administration instruction per 10mg  dose pack    cyclobenzaprine (FLEXERIL) 5 mg tablet Take 0.5 Tablets by mouth three (3) times daily as needed for Muscle Spasm(s) for up to 14 days.    levothyroxine (SYNTHROID) 150 mcg tablet TAKE 1 TABLET EVERY DAY BEFORE BREAKFAST (NEED MD APPOINTMENT)    terbinafine HCL (LAMISIL) 250 mg tablet TAKE 1 TABLET EVERY DAY    gabapentin (NEURONTIN) 300 mg capsule TAKE 1 CAPSULE EVERY MORNING AND TAKE 2 CAPSULES EVERY NIGHT    valsartan-hydroCHLOROthiazide (DIOVAN-HCT) 160-25 mg per tablet TAKE 1 TABLET EVERY DAY    omeprazole (PRILOSEC) 40 mg capsule Take 40 mg by mouth daily.    lidocaine (LIDODERM) 5 % 1 Patch by TransDERmal route every twenty-four (24) hours. 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    ZINC PO Take  by mouth.    coffee xt/phosphatidyl serine (NEURIVA ORIGINAL PO) Take  by mouth.     albuterol (PROVENTIL HFA, VENTOLIN HFA, PROAIR HFA) 90 mcg/actuation inhaler TAKE 2 PUFFS BY INHALATION EVERY FOUR (4) HOURS AS NEEDED FOR WHEEZING.    potassium chloride (KLOR-CON) 10 mEq tablet TAKE 1 TABLET EVERY DAY    vit B complx C/folic acid/zinc (VIT B COMPLEX-C-FOLIC AC-ZINC PO) Take 1 Tab by mouth daily.    latanoprost (XALATAN) 0.005 % ophthalmic solution Administer 1 Drop to both eyes nightly.    multivitamin (ONE A DAY) tablet Take 1 Tab by mouth daily.    cholecalciferol (VITAMIN D3) (5000 Units/125 mcg) tab tablet Take  by mouth daily.    raNITIdine hcl 150 mg capsule Take 150 mg by mouth nightly. (Patient not taking: No sig reported)     No current facility-administered medications for this visit.         OBJECTIVE:  alert, well appearing, and in no distress  Visit Vitals  BP (!) 152/71   Pulse 79   Temp 97.6 ??F (36.4 ??C) (Temporal)   Resp 20   Ht 5\' 2"  (1.575 m)   Wt 186 lb (84.4 kg)   SpO2 96%   BMI 34.02 kg/m??      well developed and well nourished  Back exam - negative straight-leg raise bilaterally   Musculoskeletal - abnormal exam of left hip with pain on internal rotation  Assessment/Plan      ICD-10-CM ICD-9-CM    1. Left hip pain  M25.552 719.45 XR HIP LT W OR WO PELV 2-3 VWS      predniSONE (STERAPRED DS) 10 mg dose pack      REFERRAL TO ORTHOPEDICS        Follow-up and Dispositions    Return if symptoms worsen or fail to improve.           Reviewed plan of care. Patient has provided input and agrees with goals.

## 2021-09-23 NOTE — Progress Notes (Signed)
 PT DAILY TREATMENT NOTE    Patient Name: Elaine [name]  Date:09/23/2021  DOB: 02-18-1951  [x]   Patient DOB Verified  Payor: HU[employer] MEDICARE / Plan: [employer] MEDICARE HMO / Product Type: Managed Care Medicare /    In time: 3:55  Out time: 4:50  Total Treatment Time (min): 55  Visit #: 3 of 8    Medicare/BCBS Only   Total Timed Codes (min): 55 1:1 Treatment Time: 50     Treatment Area: Other low back pain [M54.59]  SUBJECTIVE  Pain Level (0-10 scale): 0/10  [] constant [] intermittent [] improving [] worsening [] no change since onset  Any medication changes, allergies to medications, adverse drug reactions, diagnosis change, or new procedure performed?: [x]  No    []  Yes (see summary sheet for update)  Subjective functional status/changes:   Pt reports seeing the MD today regarding her left hip pain. They did an xray, started her on prednisone  and are referring her to ortho. She states her back hasn't been bothering her recently. She wants to be able to stand upright better. She notes sharp groin pain when walking.     OBJECTIVE    35 min Therapeutic Exercise:  [x]  See flow sheet :   Rationale: increase ROM and increase strength to improve the patient's ability to perform ADLs.    20 min Therapeutic Activity:  [x]  See flow sheet : height of cane adjustment; log rolling technique; education on left hip pain   Rationale: increase ROM, increase strength and improve coordination to improve the patient's ability to perform proper posture, bed mobility and transfers without p!           With   [x]  TE   []  TA   []  neuro   []  other: Patient Education: [x]  Review HEP    []  Progressed/Changed HEP based on:   []  positioning   []  body mechanics   []  transfers   []  heat/ice application    []  other:      Other Functional Tests and Measures:   Educated on left hip pain and groin referral pattern  Reviewed log rolling for transfers  Educated on gentle LTR, PPT and bridges with PPT for home to assist with postural  strengthening  Adjusted cane height back to lower height but cane couldn't go low enough for reduced shoulder hiking  No increased pain during session  Left upslip and pain with active left hip flexion; gentle inferior hip mob to trial and pt with good benefit of pain relief    Pain Level (0-10 scale) post treatment: 0/10    ASSESSMENT/Changes in Function: Pt progressing with reduction of back pain but increased of left hip pain recently to which she followed up with the MD. She demonstrates forward flexed posture due to back pain and general core and posterior chain weakness. She has left groin pain associated with arthritic changes. Will continue to progress strength and upright posture with decreased pain.        [x]   See Plan of Care  []   See progress note/recertification  []   See Discharge Summary         Progress towards goals / Updated goals:  Short Term Goals: To be accomplished in 1 weeks:  Patient will demonstrate compliance with HEP in order to improve LE strength for increased ease of ambulation.   Current: compliant (09/19/21)   Long Term Goals: To be accomplished in 4 weeks:  Goal: Patient will improve FOTO score by 21 points in order to  demonstrate a significant improvement in function.   Status at evaluation/last progress note: 39 points   2.   Goal: Patient will improve B hip extension MMT to 4-/5 in order to increase ease of ambulation.   Status at evaluation/last progress note: B: 3/5   3.   Goal: Patient will improve left hip left hip ER/IR AROM to 20 degrees in order to increase ease of transfers.   Status at evaluation/last progress note: IR: 18 degrees ER: 10 degrees   4.   Goal: Patient will report a 50% improvement in symptoms since [facility] in order to improve quality of life.   Status at evaluation/last progress note: n/a    PLAN  [x]   Upgrade activities as tolerated     [x]   Continue plan of care  []   Update interventions per flow sheet       []   Discharge due to:_  []   Other:     Leita CHRISTELLA Castor, PTA 09/23/2021  4:25 PM

## 2021-09-23 NOTE — Progress Notes (Signed)
Patient reached and made aware of XR results per Dr. Shawnie Pons. Pt verbalized understanding.

## 2021-09-23 NOTE — Progress Notes (Signed)
 Patient is in the office today for left leg pain follow up.    Do you have an Advance Directive no  Do you want more information : information given     1. Have you been to the ER, urgent care clinic since your last visit?  Hospitalized since your last visit? No    2. Have you seen or consulted any other health care providers outside of the [facility] since your last visit? No     3. For patients aged 71-75: Has the patient had a colonoscopy / FIT/ Cologuard? Yes - no Care Gap present      If the patient is female:    4. For patients aged 46-74: Has the patient had a mammogram within the past 2 years? Yes - no Care Gap present      5. For patients aged 21-65: Has the patient had a pap smear? NA - based on age or sex

## 2021-09-26 ENCOUNTER — Encounter: Attending: Internal Medicine | Primary: Internal Medicine

## 2021-09-26 ENCOUNTER — Inpatient Hospital Stay: Payer: MEDICARE | Primary: Internal Medicine

## 2021-09-29 ENCOUNTER — Inpatient Hospital Stay: Admit: 2021-09-29 | Payer: MEDICARE | Primary: Internal Medicine

## 2021-09-29 NOTE — Progress Notes (Signed)
 PT DAILY TREATMENT NOTE 06-21    Patient Name: Elaine [name]  Date:09/29/2021  DOB: 1950/12/17  [x]   Patient DOB Verified  Payor: HUMANA MEDICARE / Plan: [employer] MEDICARE HMO / Product Type: Managed Care Medicare /    In time: 4:35  Out time: 5:14  Total Treatment Time (min): 39  Visit #: 4 of 8    Medicare/BCBS Only   Total Timed Codes (min):  39 1:1 Treatment Time:  39       Treatment Area: Other low back pain [M54.59]    SUBJECTIVE  Pain Level (0-10 scale): 3/10  Any medication changes, allergies to medications, adverse drug reactions, diagnosis change, or new procedure performed?: [x]  No    []  Yes (see summary sheet for update)  Subjective functional status/changes:   []  No changes reported  Pt. Reports she is feeling pretty good today. She continues to have most pain in left groin    OBJECTIVE    31 min Therapeutic Exercise:  [x]  See flow sheet :   Rationale: increase ROM and increase strength to improve the patient's ability to perform ADLs    8 min Manual Therapy:  figure 4 hip distraction, inferior hip mobs, posterior hip mobs   The manual therapy interventions were performed at a separate and distinct time from the therapeutic activities interventions.  Rationale: decrease pain, increase ROM, and increase tissue extensibility to ambulate           With   [x]  TE   []  TA   []  neuro   []  other: Patient Education: [x]  Review HEP    []  Progressed/Changed HEP based on:   []  positioning   []  body mechanics   []  transfers   []  heat/ice application    []  other:      Other Objective/Functional Measures:   Improving mobility during LTR today  Continues to have increase pain with attempting supine hip flexor stretch  Pt. Ambulates with decreased weight shift to left side and decreased step length      Pain Level (0-10 scale) post treatment: 3/10    ASSESSMENT/Changes in Function: pt. Continues to be limited by hip pain and decreased mobility which is affecting her ambulation. She also continues to demonstrates  decreased B hip strength and decreased core stability. She is having less back pain overall.      Patient will continue to benefit from skilled PT services to modify and progress therapeutic interventions, address functional mobility deficits, address ROM deficits, address strength deficits, analyze and address soft tissue restrictions, analyze and cue movement patterns, analyze and modify body mechanics/ergonomics, assess and modify postural abnormalities, and address imbalance/dizziness to attain remaining goals.         Progress towards goals / Updated goals:  Short Term Goals: To be accomplished in 1 weeks:  Patient will demonstrate compliance with HEP in order to improve LE strength for increased ease of ambulation.   Current: compliant (09/19/21)     Long Term Goals: To be accomplished in 4 weeks:  Goal: Patient will improve FOTO score by 21 points in order to demonstrate a significant improvement in function.   Status at evaluation/last progress note: 39 points   2.   Goal: Patient will improve B hip extension MMT to 4-/5 in order to increase ease of ambulation.   Status at evaluation/last progress note: B: 3/5   3.   Goal: Patient will improve left hip left hip ER/IR AROM to 20 degrees in order to increase  ease of transfers.   Status at evaluation/last progress note: IR: 18 degrees ER: 10 degrees   4.   Goal: Patient will report a 50% improvement in symptoms since [facility] in order to improve quality of life.   Status at evaluation/last progress note: n/a  Pt. Reports having less pain today (09/29/21)    PLAN  []   Upgrade activities as tolerated     [x]   Continue plan of care  []   Update interventions per flow sheet       []   Discharge due to:_  []   Other:_      Garnette Level, PT 09/29/2021  7:58 AM    Future Appointments   Date Time Provider Department Center   09/29/2021  4:30 PM Level Garnette, PT [facility]   09/30/2021  3:00 PM Randell Almarie DELENA JOSETTA [facility]   10/01/2021  3:30 PM Alethea Dowdy, MD [facility]   10/02/2021 12:00 PM Delaney Rosaline HERO, DO [facility]   10/02/2021  3:45 PM Randell Almarie DELENA, PTA [facility]   10/07/2021  3:45 PM Randell Almarie DELENA JOSETTA [facility]   10/09/2021  3:45 PM Randell Almarie DELENA JOSETTA [facility]   10/14/2021  3:45 PM Randell Almarie DELENA, PTA [facility]   11/17/2021  3:30 PM I[facility] LAB VISIT [facility]   11/24/2021  3:40 PM Hunte, Marcus CROME, MD [facility]   06/19/2022  9:00 AM Charlanne Nolan POUR, MD [facility] AM[facility]

## 2021-09-30 ENCOUNTER — Encounter: Payer: MEDICARE | Primary: Internal Medicine

## 2021-09-30 NOTE — Progress Notes (Signed)
 PT DAILY TREATMENT NOTE 06-21    Patient Name: Elaine [name]  Date:09/30/2021  DOB: 1951/08/15  [x]   Patient DOB Verified  Payor: HUMANA MEDICARE / Plan: [employer] MEDICARE HMO / Product Type: Managed Care Medicare /    In time:315  Out time:345  Total Treatment Time (min): 30  Visit #: 5 of 8    Medicare/BCBS Only   Total Timed Codes (min):  30 1:1 Treatment Time:  30       Treatment Area: Other low back pain [M54.59]    SUBJECTIVE  Pain Level (0-10 scale): 3/10  Any medication changes, allergies to medications, adverse drug reactions, diagnosis change, or new procedure performed?: [x]  No    []  Yes (see summary sheet for update)  Subjective functional status/changes:   []  No changes reported  Pt stated that she feels pretty good today and had no pain yesterday. She thinks it is due to the steroid she is on.     OBJECTIVE    20 min Therapeutic Exercise:  [x]  See flow sheet :   Rationale: increase ROM and increase strength to improve the patient's ability to increase ease with ADLs     10 min Neuromuscular Re-education:  [x]   See flow sheet :   Rationale: increase strength, improve coordination, improve balance, and increase proprioception  to improve the patient's ability to increase ease with ADLs    With   [x]  TE   []  TA   []  neuro   []  other: Patient Education: [x]  Review HEP    []  Progressed/Changed HEP based on:   []  positioning   []  body mechanics   []  transfers   []  heat/ice application    []  other:      Other Objective/Functional Measures:   15 minutes late  Complained of increased pain in the back of the thighs with PPT  Reported increased pain in the left groin with bridges     Pain Level (0-10 scale) post  treatment: 0/10    ASSESSMENT/Changes in Function:   Pt is making slow progress toward goals. Pt reported decreased pain today stating that the steroids are helping. Cont to report decreased ability to perform some ADLs and household chores. Cont with decreased strength in B hips. Cont with  decreased standing and walking tolerance.     Patient will continue to benefit from skilled PT services to modify and progress therapeutic interventions, address functional mobility deficits, address ROM deficits, address strength deficits, analyze and address soft tissue restrictions, analyze and cue movement patterns, analyze and modify body mechanics/ergonomics, assess and modify postural abnormalities, address imbalance/dizziness, and instruct in home and community integration to attain remaining goals.     [x]   See Plan of Care  []   See progress note/recertification  []   See Discharge Summary         Progress towards goals / Updated goals:  Short Term Goals: To be accomplished in 1 weeks:  Patient will demonstrate compliance with HEP in order to improve LE strength for increased ease of ambulation.   Current: compliant (09/19/21)      Long Term Goals: To be accomplished in 4 weeks:  Goal: Patient will improve FOTO score by 21 points in order to demonstrate a significant improvement in function.   Status at evaluation/last progress note: 39 points   2.   Goal: Patient will improve B hip extension MMT to 4-/5 in order to increase ease of ambulation.   Status at evaluation/last progress note: B:  3/5   3.   Goal: Patient will improve left hip left hip ER/IR AROM to 20 degrees in order to increase ease of transfers.   Status at evaluation/last progress note: IR: 18 degrees ER: 10 degrees   4.   Goal: Patient will report a 50% improvement in symptoms since [facility] in order to improve quality of life.   Status at evaluation/last progress note: n/a  Pt. Reports having less pain today (09/29/21)    PLAN  []   Upgrade activities as tolerated     [x]   Continue plan of care  []   Update interventions per flow sheet       []   Discharge due to:_  []   Other:_      Berwyn Quince, PTA 09/30/2021  12:22 PM    Future Appointments   Date Time Provider Department Center   09/30/2021  3:00 PM Quince Berwyn HACKNEY [facility]    10/01/2021  3:30 PM Alethea Dowdy, MD [facility] BS AMB   10/02/2021 11:30 AM PPA SPIROMETRY [facility] BS AMB   10/02/2021 12:00 PM Delaney Rosaline HERO, DO [facility] BS AMB   10/02/2021  3:45 PM Randell Almarie LABOR, PTA [facility]   10/07/2021  3:30 PM Westcott, Morene ORN, DO [facility] BS AMB   10/07/2021  3:45 PM Randell Almarie LABOR, PTA [facility]   10/09/2021  3:45 PM Randell Almarie LABOR, PTA [facility]   10/14/2021  3:45 PM Randell Almarie LABOR, PTA [facility]   11/17/2021  3:30 PM IOC LAB VISIT IOC BS AMB   11/24/2021  3:40 PM Hunte, Marcus CROME, MD IOC BS AMB   06/19/2022  9:00 AM Charlanne Nolan POUR, MD CAP BS AMB

## 2021-10-01 ENCOUNTER — Encounter: Payer: MEDICARE | Attending: Internal Medicine | Primary: Internal Medicine

## 2021-10-01 ENCOUNTER — Ambulatory Visit: Attending: Internal Medicine | Primary: Internal Medicine

## 2021-10-01 DIAGNOSIS — M25552 Pain in left hip: Secondary | ICD-10-CM

## 2021-10-01 NOTE — Progress Notes (Signed)
 Elaine [name] presents today for   Chief Complaint   Patient presents with    Follow-up       1. Have you been to the ER, urgent care clinic since your last visit?  Hospitalized since your last visit? no    2. Have you seen or consulted any other health care providers outside of the [facility] since your last visit? no     3. For patients aged 71-75: Has the patient had a colonoscopy / FIT/ Cologuard? Yes - no Care Gap present      If the patient is female:    4. For patients aged 14-74: Has the patient had a mammogram within the past 2 years? Yes - Care Gap present. Rooming MA/LPN to request most recent results  See top three    5. For patients aged 21-65: Has the patient had a pap smear? NA - based on age or sex

## 2021-10-01 NOTE — Assessment & Plan Note (Signed)
Left hip pain better with steroids.  Awaiting orthopedic consult.  Provided letter for work.

## 2021-10-01 NOTE — Progress Notes (Signed)
Elaine [name] is a 70 y.o. year old female who is a new patient to me today.  She sees Dr. Madelyn Brunner.        Subjective:   Elaine [name] was seen today for Follow-up      Patient says that the pain is getting better with prednisone.  Patient has an appointment with orthopedics November 1.  No new complaints or concerns.      The x-ray had shown mild degenerative changes, right femoral neck sclerotic benign appearing lesion.    Patient is asking for an office note as she would not be able to join work till 11/2.    No new complaints or concerns.    Past Medical History:   Diagnosis Date    Anemia     Asthma     Colon polyp     DDD (degenerative disc disease) 02/15/2009    Depression 12/18/2011    Essential hypertension, benign     History of echocardiogram 03/20/2004    EF 70%.  Borderline DDfx.  No significant valvular pathology.    Hypercholesterolemia     Hypertension     Iron deficiency anemia 3/87/5643    Lichen planus 03/05/5187    Lower extremity venous duplex 10/23/2009    Left leg:  No DVT.      Meralgia paraesthetica 10/22/2013    OA (osteoarthritis of spine) 02/15/2009    OA (osteoarthritis of spine) 02/15/2009    Obesity, unspecified     Other and unspecified hyperlipidemia     Pre-operative cardiovascular examination     For spine surgery    Right sided sciatica     Shortness of breath     Possible asthma, HCVD; less likely CAD (Noted 03/15/09)    Sleep apnea 02/15/2009    uses cpap machine    Thallium stress test  03/20/2004    Partially transient, mod basal & mid anterior defect most c/w artifact; mild anterior ischemia less likely.  Neg EKG on pharm stress test.    Thyroid disease     hypothyroidism       Past Surgical History:   Procedure Laterality Date    COLONOSCOPY N/A 04/25/2020    SCREENING COLONOSCOPY performed by Sloan Leiter, MD at [facility] ENDOSCOPY    HX COLONOSCOPY  02-24-12    normal (Dr. Angelique Holm)    HX HERNIA REPAIR      3x    HX HYSTERECTOMY      HX TUBAL LIGATION      NEUROLOGICAL PROCEDURE  UNLISTED  03-16-2009    s/p ACD & Fusion (Dr.P.Gurtner)    PR COLONOSCOPY FLX DX W/COLLJ SPEC WHEN PFRMD  02-2007    +polyp(tubular adenoma); Dr Angelique Holm       Family History   Problem Relation Age of Onset    Cancer Mother     Hypertension Mother     Hypertension Sister         x3    Heart Surgery Sister     Heart Disease Sister     Heart Attack Sister     Cancer Maternal Aunt         breast    Breast Cancer Maternal Aunt     Glaucoma Son     Heart defect Son     Diabetes Paternal Uncle     Hypertension Paternal Uncle        Social History     Socioeconomic History    Marital status: DIVORCED  Spouse name: Not on file    Number of children: Not on file    Years of education: Not on file    Highest education level: Not on file   Occupational History    Not on file   Tobacco Use    Smoking status: Former     Packs/day: 1.00     Years: 28.00     Pack years: 28.00     Types: Cigarettes     Start date: 10/16/1977     Quit date: 04/25/2004     Years since quitting: 17.4    Smokeless tobacco: Never   Substance and Sexual Activity    Alcohol use: No    Drug use: No    Sexual activity: Not on file   Other Topics Concern    Not on file   Social History Narrative    Not on file     Social Determinants of Health     Financial Resource Strain: Not on file   Food Insecurity: Not on file   Transportation Needs: Not on file   Physical Activity: Not on file   Stress: Not on file   Social Connections: Not on file   Intimate Partner Violence: Not on file   Housing Stability: Not on file       Allergies   Allergen Reactions    Aspirin Other (comments)     GI distress,     Asa-Acetaminophen-Caff-Potass Other (comments)     Bleeding in stomach    Benadryl [Diphenhydramine Hcl] Other (comments)     Muscle jerking    Other Plant, Higher education careers adviser, Environmental Not Reported This Time     Grass, Dust and Mold    Pollen Extracts Not Reported This Time       Current Outpatient Medications on File Prior to Visit   Medication Sig Dispense Refill     predniSONE (STERAPRED DS) 10 mg dose pack See administration instruction per 5m dose pack 21 Tablet 0    levothyroxine (SYNTHROID) 150 mcg tablet TAKE 1 TABLET EVERY DAY BEFORE BREAKFAST (NEED MD APPOINTMENT) 90 Tablet 0    gabapentin (NEURONTIN) 300 mg capsule TAKE 1 CAPSULE EVERY MORNING AND TAKE 2 CAPSULES EVERY NIGHT 270 Capsule 2    valsartan-hydroCHLOROthiazide (DIOVAN-HCT) 160-25 mg per tablet TAKE 1 TABLET EVERY DAY 90 Tablet 1    omeprazole (PRILOSEC) 40 mg capsule Take 40 mg by mouth daily.      lidocaine (LIDODERM) 5 % 1 Patch by TransDERmal route every twenty-four (24) hours. 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 90 Tablet 3    ZINC PO Take  by mouth.      albuterol (PROVENTIL HFA, VENTOLIN HFA, PROAIR HFA) 90 mcg/actuation inhaler TAKE 2 PUFFS BY INHALATION EVERY FOUR (4) HOURS AS NEEDED FOR WHEEZING. 1 Inhaler 5    potassium chloride (KLOR-CON) 10 mEq tablet TAKE 1 TABLET EVERY DAY 90 Tablet 3    vit B complx C/folic acid/zinc (VIT B COMPLEX-C-FOLIC AC-ZINC PO) Take 1 Tab by mouth daily.      latanoprost (XALATAN) 0.005 % ophthalmic solution Administer 1 Drop to both eyes nightly.      multivitamin (ONE A DAY) tablet Take 1 Tab by mouth daily.      cholecalciferol (VITAMIN D3) (5000 Units/125 mcg) tab tablet Take  by mouth daily.      terbinafine HCL (LAMISIL) 250 mg tablet TAKE 1 TABLET EVERY DAY (Patient not taking: Reported on 10/01/2021) 90 Tablet 0    raNITIdine hcl 150 mg capsule Take 150 mg by mouth nightly. (Patient not taking: No sig reported)      coffee xt/phosphatidyl serine (NEURIVA ORIGINAL PO) Take  by mouth. (Patient not taking: Reported on 10/01/2021)       No current facility-administered medications on file prior to visit.        ROS as above        Objective:     Vitals:    10/01/21 1539   BP: 131/63    Pulse: 76   Resp: 16   Temp: 98.2 ??F (36.8 ??C)   TempSrc: Temporal   SpO2: 96%   Weight: 187 lb (84.8 kg)   Height: '5\' 2"'  (1.575 m)      Physical Exam  Cardiovascular:      Rate and Rhythm: Normal rate and regular rhythm.   Pulmonary:      Effort: Pulmonary effort is normal.      Breath sounds: Normal breath sounds.   Musculoskeletal:      Comments: Minimal pain on movement of left hip.   SLR negative bilaterally          Labs:     Results for orders placed or performed in visit on 09/11/21   CBC WITH AUTOMATED DIFF   Result Value Ref Range    WBC 4.6 3.4 - 10.8 x10E3/uL    RBC 4.24 3.77 - 5.28 x10E6/uL    HGB 11.1 11.1 - 15.9 g/dL    HCT 34.0 34.0 - 46.6 %    MCV 80 79 - 97 fL    MCH 26.2 (L) 26.6 - 33.0 pg    MCHC 32.6 31.5 - 35.7 g/dL    RDW 15.7 (H) 11.7 - 15.4 %    PLATELET 312 150 - 450 x10E3/uL    NEUTROPHILS 71 Not Estab. %    Lymphocytes 18 Not Estab. %    MONOCYTES 8 Not Estab. %    EOSINOPHILS 2 Not Estab. %    BASOPHILS 1 Not Estab. %    ABS. NEUTROPHILS 3.4 1.4 - 7.0 x10E3/uL    Abs Lymphocytes 0.8 0.7 - 3.1 x10E3/uL    ABS. MONOCYTES 0.4 0.1 - 0.9 x10E3/uL    ABS. EOSINOPHILS 0.1 0.0 - 0.4 x10E3/uL    ABS. BASOPHILS 0.0 0.0 - 0.2 x10E3/uL    IMMATURE GRANULOCYTES 0 Not Estab. %    ABS. IMM. GRANS. 0.0 0.0 - 0.1 [id number]   METABOLIC PANEL, BASIC   Result Value Ref Range    Glucose 80 70 - 99 mg/dL    BUN 6 (L) 8 - 27 mg/dL    Creatinine 0.59 0.57 - 1.00 mg/dL    eGFR 96 >59 mL/min/1.73    BUN/Creatinine ratio 10 (L) 12 - 28    Sodium 141 134 - 144 mmol/L    Potassium 3.9 3.5 - 5.2 mmol/L    Chloride 98 96 - 106 mmol/L    CO2 28 20 - 29 mmol/L    Calcium 10.2 8.7 - 10.3 mg/dL   VITAMIN D, 25 HYDROXY   Result Value Ref Range    VITAMIN D, 25-HYDROXY 53.5 30.0 - 100.0 ng/mL   VITAMIN B12   Result Value Ref Range    Vitamin B12 >2000 (H) 232 - 1245 pg/mL  Active Problems:     Patient Active Problem List    Diagnosis    Muscle spasm of left lower extremity    Left leg pain    Needs flu shot    Sore  throat    Memory problem    Severe obesity (BMI 35.0-39.9) with comorbidity ([place])    Chronic diastolic congestive heart failure ([place])     2/18 NYHA2, 70-75%EF, add coreg      Hospital discharge follow-up    Chest pain    Routine general medical examination at a health care facility    Need for pneumococcal vaccination    URI (upper respiratory infection)    Cough    Encounter for long-term (current) use of medications    Chronic midline low back pain without sciatica    Rash    Claustrophobia    Migraine without aura and without status migrainosus, not intractable    Dehydration    Chiari I malformation (HCC)    Post-menopausal    Osteoarthritis of left knee    Left knee pain    Swelling of left lower extremity    Need for hepatitis C screening test    Glaucoma screening    Abnormal stress test    Obesity (BMI 30-39.9)     Weight loss has been strongly encouraged by following dietary restrictions and an exercise routine.      Pre-diabetes    Meralgia paraesthetica    Depression    Sciatica    Submental lymphadenopathy    DOE (dyspnea on exertion)    Hypothyroidism    Pure hypercholesterolemia     chk lipids      HTN (hypertension), benign     controlled      GERD (gastroesophageal reflux disease)    Asthma    Vitamin D deficiency    Iron deficiency anemia     F/u PCP, get IV iron off & on         Assessment & Plan:     Diagnoses and all orders for this visit:    1. Left hip pain  Assessment & Plan:  Left hip pain better with steroids.  Awaiting orthopedic consult.  Provided letter for work.                      Disclaimer:    The patient understands our medical plan.  Alternatives have been explained and offered.  The risks, benefits and significant side effects of all medications have been reviewed. Anticipated time course and progression of condition reviewed. All questions have been addressed.  She is encouraged to employ the information provided in the after visit summary, which was reviewed.      Where  applicable, she is instructed to call the clinic if she has not been notified either by phone or through [place] with the results of her tests or with an appointment plan for any referrals within 1 week(s).  No news is not good news; it's no news. The patient  is to call if her condition worsens or fails to improve or if significant side effects are experienced.           Celine Mans, MD

## 2021-10-02 ENCOUNTER — Ambulatory Visit: Admit: 2021-10-02 | Discharge: 2021-10-02 | Payer: MEDICARE | Attending: Pulmonary Disease | Primary: Internal Medicine

## 2021-10-02 ENCOUNTER — Ambulatory Visit: Payer: MEDICARE | Primary: Internal Medicine

## 2021-10-02 ENCOUNTER — Encounter: Payer: MEDICARE | Primary: Internal Medicine

## 2021-10-02 ENCOUNTER — Ambulatory Visit: Attending: Pulmonary Disease | Primary: Internal Medicine

## 2021-10-02 DIAGNOSIS — G4731 Primary central sleep apnea: Secondary | ICD-10-CM

## 2021-10-02 MED ORDER — TRELEGY ELLIPTA 200 MCG-62.5 MCG-25 MCG POWDER FOR INHALATION
Freq: Every day | RESPIRATORY_TRACT | 3 refills | Status: DC
Start: 2021-10-02 — End: 2021-10-23

## 2021-10-02 NOTE — Progress Notes (Signed)
Elaine [name] presents today for   Chief Complaint   Patient presents with    Shortness of Breath    Follow-up    Sleep Apnea    CPAP       Is someone accompanying this pt? no    Is the patient using any DME equipment during OV? Yes - cpap     -[employer] [phone]    Depression Screening:  3 most recent PHQ Screens 10/02/2021   Little interest or pleasure in doing things Not at all   Feeling down, depressed, irritable, or hopeless Not at all   Total Score PHQ 2 0   Trouble falling or staying asleep, or sleeping too much -   Feeling tired or having little energy -   Poor appetite, weight loss, or overeating -   Feeling bad about yourself - or that you are a failure or have let yourself or your family down -   Trouble concentrating on things such as school, work, reading, or watching TV -   Moving or speaking so slowly that other people could have noticed; or the opposite being so fidgety that others notice -   Thoughts of being better off dead, or hurting yourself in some way -   PHQ 9 Score -       Epworth Sleepiness Scale:  Epworth Sleepiness Scale  10/02/2021   Sitting and Reading 3   Watching TV 2   Sitting, inactive in a public place (e.g. a movie theater or meeting) 1   As a passenger in a car for an hour, without a break 3   Lying down to rest in the afternoon, when circumstances permit 3   Sitting and talking to someone 1   Sitting quietly after lunch without alcohol 2   In a car, while stopped for a few minutes in traffic 1   Epworth Sleepiness Score 16         Coordination of Care:  1. Have you been to the ER, urgent care clinic since your last visit? Hospitalized since your last visit? No    2. Have you seen or consulted any other health care providers outside of the [place] since your last visit? Include any pap smears or colon screening. N/a    Medication list has been updated according to patient.

## 2021-10-02 NOTE — Progress Notes (Signed)
 PT DAILY TREATMENT NOTE 06-21    Patient Name: Elaine [name]  Date:10/02/2021  DOB: Mar 12, 1951  [x]   Patient DOB Verified  Payor: HUMANA MEDICARE / Plan: [employer] MEDICARE HMO / Product Type: Managed Care Medicare /    In time: 3:45  Out time: 4:25  Total Treatment Time (min): 40  Visit #: 6 of 8    Medicare/BCBS Only   Total Timed Codes (min):  40 1:1 Treatment Time: 40       Treatment Area: Other low back pain [M54.59]    SUBJECTIVE  Pain Level (0-10 scale): 3/10 left groin depending how she moves  Any medication changes, allergies to medications, adverse drug reactions, diagnosis change, or new procedure performed?: [x]  No    []  Yes (see summary sheet for update)  Subjective functional status/changes:   []  No changes reported  Pt stated that the Prednisone  is really helping. Her PCP told her they saw something wrong with her hip on her xray but are unsure what, she sees her specialist on Monday.     OBJECTIVE    25 min Therapeutic Exercise:  [x]  See flow sheet :   Rationale: increase ROM and increase strength to improve the patient's ability to increase ease with ADLs     15 min Neuromuscular Re-education:  [x]   See flow sheet : core re-education   Rationale: increase strength, improve coordination, improve balance, and increase proprioception  to improve the patient's ability to increase ease with ADLs    With   [x]  TE   []  TA   []  neuro   []  other: Patient Education: [x]  Review HEP    []  Progressed/Changed HEP based on:   []  positioning   []  body mechanics   []  transfers   []  heat/ice application    []  other:      Other Objective/Functional Measures:   - initiated seated row/ext and paloff press with GTB  - continues to be challenged with prone opposite UE/LE lifts    Pain Level (0-10 scale) post  treatment: 0/10    ASSESSMENT/Changes in Function:   Patient is making steady progress in therapy and has had good pain reduction with steroid rx. She used to only be pain free with sitting and is now able to  stand and walk with less pain. Pain remains in the left groin/hip region and not her back. No change to program at time, will progress as tolerated.     Patient will continue to benefit from skilled PT services to modify and progress therapeutic interventions, address functional mobility deficits, address ROM deficits, address strength deficits, analyze and address soft tissue restrictions, analyze and cue movement patterns, analyze and modify body mechanics/ergonomics, assess and modify postural abnormalities, address imbalance/dizziness, and instruct in home and community integration to attain remaining goals.     [x]   See Plan of Care  []   See progress note/recertification  []   See Discharge Summary         Progress towards goals / Updated goals:  Short Term Goals: To be accomplished in 1 weeks:  Patient will demonstrate compliance with HEP in order to improve LE strength for increased ease of ambulation.   Current: compliant (09/19/21)      Long Term Goals: To be accomplished in 4 weeks:  Goal: Patient will improve FOTO score by 21 points in order to demonstrate a significant improvement in function.   Status at evaluation/last progress note: 39 points     2.  Goal: Patient will improve B hip extension MMT to 4-/5 in order to increase ease of ambulation.   Status at evaluation/last progress note: B: 3/5     3.   Goal: Patient will improve left hip left hip ER/IR AROM to 20 degrees in order to increase ease of transfers.   Status at evaluation/last progress note: IR: 18 degrees ER: 10 degrees     4.   Goal: Patient will report a 50% improvement in symptoms since [facility] in order to improve quality of life.   Status at evaluation/last progress note: n/a  Pt. Reports having less pain today (09/29/21)    PLAN  []   Upgrade activities as tolerated     [x]   Continue plan of care  []   Update interventions per flow sheet       []   Discharge due to:_  []   Other:_      Elaine [name], PTA 10/02/2021  4:25 PM    Future  Appointments   Date Time Provider Department Center   10/02/2021 11:30 AM PPA SPIROMETRY [facility] BS AMB   10/02/2021 12:00 PM Delaney Rosaline HERO, DO [facility] BS AMB   10/02/2021  3:45 PM [name] Elaine DELENA, PTA [facility]   10/07/2021 10:30 AM [name] Elaine DELENA, PTA [facility]   10/07/2021  3:30 PM Westcott, Morene ORN, DO [facility] BS AMB   10/09/2021  3:45 PM [name] Elaine DELENA, PTA [facility]   10/13/2021  3:30 PM Alethea Dowdy, MD IOC BS AMB   10/14/2021  3:45 PM [name] Elaine DELENA, PTA [facility]   11/17/2021  3:30 PM IOC LAB VISIT IOC BS AMB   11/24/2021  3:40 PM Hunte, Marcus CROME, MD IOC BS AMB   06/19/2022  9:00 AM Charlanne Nolan POUR, MD CAP BS AMB

## 2021-10-02 NOTE — Progress Notes (Signed)
Progress Notes by Melissa Montane, DO at 10/02/21 1200                Author: Melissa Montane, DO  Service: --  Author Type: Physician       Filed: 10/06/21 1524  Encounter Date: 10/02/2021  Status: Signed          Editor: Melissa Montane, DO (Physician)                    [facility]   Pulmonary, Critical Care, and Sleep Medicine      Office Progress Note          Primary Care Physician: Thana Farr, MD        Reason for Visit:  Follow up OSA and Asthma      Assessment & Plan:            Assessment:   1.  ComplexSleep Apnea (OSA): Severe: (HST: 04/07/19)- Overall AHI: 31.2 and CAI:  12.1-Reports clinical benefit from.  AHI today is a little high today.  I think this is due to leaks.  The EPAP min may also need to be increased and IPAP max decreased   2.  Excessive Daytime Sleepiness (EDS)/ Hypersomnia- persistent- suspect due to several factors: need to better optimize CSA, and other medical  conditions   3.  Hypertension: BP elevated today in clinic- patient is asymptomatic   4.  Nocturia- gets up to use the bathroom 3-5 times nightly   5.  Asthma: No acute exacerbation: Currently on Wixela, recently started on immunotherapy from ENT, does report some breakthrough dyspnea. PFTs today with mixed moderate restriction, and mild non-reversible  obstruction   6.  Seasonal Allergies, not an issue at this time; stopped previous nasal steroids due to sores in nares   7.  Obesity: Body mass index is 34.46 kg/m??.   8.  H/O COVID-19: December 2020- no hospitalization- back at baseline   9.  Ex-Smoker: Quit in [place]:   ??  Continue Singulair 10mg  daily   ??  Continue Albuterol MDI: PRN- monitor and track use   ??  Restart Trelegy 200/62.5 daily   ??  Patient asked to call our clinic if she is unable to get Trelegy or has any difficulties with this medication   ??  Trigger avoidance   ??     ??  Change VPAP to IPAP max 18  and EPAP min 9  PS:5   ??  Proper PAP hygiene and compliance  requirements reviewed and discussed   ??  Healthy weight and sleep habits encouraged   ??  Continue with smoking cessation   ??  Driver safety reviewed   ??  Follow up with Primary Care Provider (PCP) as directed and for routine health care maintenance.   ??  Patient is encouraged to contact our clinic if patient is experiencing in difficulties with using PAP device.   ??  Follow up in our clinic in 3 months; sooner if needed.                  History of Present Illness: Elaine [name] is a  70 y.o. female patient who presents for follow up of Complex Sleep Apnea (CSA) and asthma.       At last visit, the patient was pending starting autoBIPAP. Her ENT has requested re-evaluation for her asthma and report of audible wheezing prior  to starting the patient on immunotherapy. Combination LABA-ICS had been ordered in the past but the patient  was not able to afford it. We started the patient of generic Advair (Wixela) at last visit.      Patient reports that she was diagnosed with asthma in her 30's. She has never been admitted to the hospital for her asthma      Today the following is noted and/or reported.  Last Clinic Visit: 02/10/20      - The patient was seen by her ENT provider last week for her allergy shots. The patient states she was told that her oxygen was too low and they held off on giving the patient her allergy shot.   The patient denies audible wheezing and reports feeling fine. She reports she was surprised when she went to ENT was told that she was wheezing.   - Our office performed a 6MW today and the patient did not have any oxygen requirement   - BP elevated today in clinic- patient is asymptomatic   - Patient is ambulating with cane today- her ambulation is limited by chronic hip and back pain      Asthma:      ??  No recent ED or Urgent Care visits   ??  No fever, chills, sputum production or chest tightness   ??  She has GERD that is controlled with Prilosec   ??  Pets: 1 dog   ??  She continues to  refrain from smoking   ??  Triggers   ??  Heat   ??  A/C in cars   ??  Fall and Spring are her symptomatic months- especially fall- She was hoping that immunotherapy would make her less symptomatic during these times   ??  Inhalers and other asthma therapy:   ??  Trelegy.  The patient was previously on [place] and switched to Trelegy.  The patient states that she felt she was taking too many medications  so she stopped taking her Trelegy and has not noticed any difference   ??  Albuterol MDI PRN: Currently she is taking 2 times weekly   ??  Singulair 10mg  daily         OSA/PAP Therapy   ??  She has been using her device.   ??  She reports ongoing clinical benefit. She  reports that she breaths and sleeps better using her device.  She has some prior PAP intolerance in the past so she has been on a lower EPAP   ??  No aerophagia or bloating.   ??  No mask/skin difficulties but she does note some occassional leaks.   ??  The patient has dentures, which she takes out at night for sleeping.    ??  She reports she has been cleaning her device, mask and hose.      Positive Airway Pressure (PAP) Compliance  Report & Summary Trends   DME:  [facility]   Device: AirCurve 10            Date  >4 hr use % Time   (Total Time%)  AHI  PAP Rx  Vt 95%   (mls)  Median Use Time  Leak-Median   (95%)  Notes              11/22/19-02/19/20  64 (91)  9.8  VPAP 25/7 PS:5  474  04:52:00  12.4 (40.9)       //29/22-10/26/22  84 (94)  14.9  VPAP  25/7 PS:5  29  04:41:00  50.5 (73.4)                                                                  Occupation:    Building control surveyor                      Work Schedule: 0730-1600   Shift work: No         Family Sleep History:   - Nephew:  Reportedly died from sleep Apnea          Asthma Control Test 46Yrs Older  02/20/2020  07/17/2019         In the past 4 weeks, how much of the time did your asthma keep you from getting as much done at work, school, or at home?  4  3     During the past 4 weeks how often have you had  shortness of breath  2  3     During the past 4 weeks often did your asthma symptoms wake up you at night or earlier than usual in the morning  4  2     During the past 4 weeks how often have you used your rescue inhaler or nebulizer medication   3  4     How would you rate your asthma control during the past 4 weeks  3  4         Score  16  16                Stop Bang  10/02/2021  04/24/2020  06/26/2019  02/20/2019           Does the patient snore loudly (louder than talking or loud enough to be heard through closed doors)?  1  1  1  1      Does the patient often feel tired, fatigued, or sleepy during the daytime, even after a "good" night's sleep?  1  1  1  1            Has anyone ever observed the patient stop breathing during their sleep?   1  1  1   0           Does the patient have or are they being treated for high blood pressure?  1  1  1  1      Is the patient's BMI greater than 35?  0  1  0  0     Is your neck circumference greater than 17 inches (Female) or 16 inches (Female)?  0  0  0  0     Is the patient older than 50?  1  1  1  1      Is the patient female?  0  0  0  0     OSA Score  5  6  5  4      Has the patient been referred to Sleep Medicine?  1  1  1   -     Has the patient previously been diagnosed with Obstructive Sleep Apnea?  1  1  1   -           Treated or Untreated?  Treated  Treated  Untreated  -  3 most recent PHQ Screens  10/02/2021        Little interest or pleasure in doing things  Not at all     Feeling down, depressed, irritable, or hopeless  Not at all     Total Score PHQ 2  0     Trouble falling or staying asleep, or sleeping too much  -     Feeling tired or having little energy  -     Poor appetite, weight loss, or overeating  -     Feeling bad about yourself - or that you are a failure or have let yourself or your family down  -     Trouble concentrating on things such as school, work, reading, or watching TV  -     Moving or speaking so slowly that other people could have  noticed; or the opposite being so fidgety that others notice  -     Thoughts of being better off dead, or hurting yourself in some way  -        PHQ 9 Score  -                 Epworth Scale  10/02/2021  02/20/2020  04/12/2019  02/20/2019           Sitting and Reading  3  1  3  3      Watching TV  2  0  3  3     Sitting, inactive in a public place (e.g. a movie theater or meeting)  1  0  3  3     As a passenger in a car for an hour, without a break  3  1  3  3            Lying down to rest in the afternoon, when circumstances permit  3  2  3  3            Sitting and talking to someone  1  0  2  1     Sitting quietly after lunch without alcohol  2  2  3  2      In a car, while stopped for a few minutes in traffic  1  0  2  1           Epworth Sleepiness Score  16  6  22  19                Immunization History        Administered  Date(s) Administered         ?  COVID-19, PFIZER PURPLE top, DILUTE for use, (age 30 y+), IM, 45mcg/0.3mL  02/28/2020, 03/20/2020     ?  Influenza High Dose Vaccine PF  10/28/2016     ?  Influenza Vaccine (Tri) Adjuvanted (>65 Yrs FLUAD TRI 29528)  10/14/2018     ?  Influenza Vaccine PF  10/02/2013     ?  Influenza Vaccine Split  01/06/2010, 08/21/2010, 10/20/2011     ?  Influenza, FLUAD, (age 33 y+), Adjuvanted  09/06/2019, 09/11/2021     ?  Influenza, [place], [place], [place] (age 78 mo+) AND AFLURIA, (age 6 y+), PF, 0.72mL  10/24/2014, 09/17/2015     ?  Pneumococcal Conjugate (PCV-13)  02/19/2016     ?  Pneumococcal Polysaccharide (PPSV-23)  10/14/2018     ?  TB Skin Test (PPD) Intradermal  03/03/2016, 03/21/2018, 03/05/2020     ?  Tdap  10/17/2013     ?  Zoster Recombinant  09/01/2021         ?  Zoster Vaccine, Live  07/02/2015, 08/24/2021              Past Medical History:     Past Medical History:        Diagnosis  Date         ?  Anemia       ?  Asthma       ?  Colon polyp       ?  DDD (degenerative disc disease)  02/15/2009     ?  Depression  12/18/2011     ?  Essential hypertension, benign        ?  History of echocardiogram  03/20/2004          EF 70%.  Borderline DDfx.  No significant valvular pathology.         ?  Hypercholesterolemia       ?  Hypertension       ?  Iron deficiency anemia  02/15/2009     ?  Lichen planus  7/32/2025     ?  Lower extremity venous duplex  10/23/2009          Left leg:  No DVT.           ?  Meralgia paraesthetica  10/22/2013     ?  OA (osteoarthritis of spine)  02/15/2009     ?  OA (osteoarthritis of spine)  02/15/2009     ?  Obesity, unspecified       ?  Other and unspecified hyperlipidemia       ?  Pre-operative cardiovascular examination            For spine surgery         ?  Right sided sciatica       ?  Shortness of breath            Possible asthma, HCVD; less likely CAD (Noted 03/15/09)         ?  Sleep apnea  02/15/2009          uses cpap machine         ?  Thallium stress test   03/20/2004          Partially transient, mod basal & mid anterior defect most c/w artifact; mild anterior ischemia less likely.  Neg EKG on pharm stress test.         ?  Thyroid disease            hypothyroidism           Past Surgical History:     Past Surgical History:         Procedure  Laterality  Date          ?  COLONOSCOPY  N/A  04/25/2020          SCREENING COLONOSCOPY performed by Sloan Leiter, MD at [facility]OSCOPY          ?  HX COLONOSCOPY    02-24-12          normal (Dr. Angelique Holm)          ?  HX HERNIA REPAIR              3x          ?  HX HYSTERECTOMY         ?  HX TUBAL LIGATION         ?  NEUROLOGICAL PROCEDURE UNLISTED    03-16-2009          s/p ACD & Fusion (Dr.P.Gurtner)          ?  PR COLONOSCOPY FLX DX W/COLLJ SPEC WHEN PFRMD    02-2007          +polyp(tubular adenoma); Dr Angelique Holm           Family History:     Family History         Problem  Relation  Age of Onset          ?  Cancer  Mother       ?  Hypertension  Mother       ?  Hypertension  Sister                x3          ?  Heart Surgery  Sister       ?  Heart Disease  Sister       ?  Heart Attack  Sister       ?  Cancer   Maternal Aunt                breast          ?  Breast Cancer  Maternal Aunt       ?  Glaucoma  Son       ?  Heart defect  Son       ?  Diabetes  Paternal Uncle            ?  Hypertension  Paternal Uncle             Social History:     Social History          Tobacco Use         ?  Smoking status:  Former              Packs/day:  1.00         Years:  28.00         Pack years:  28.00         Types:  Cigarettes         Start date:  10/16/1977         Quit date:  04/25/2004         Years since quitting:  17.4         ?  Smokeless tobacco:  Never       Substance Use Topics         ?  Alcohol use:  No         ?  Drug use:  No                 Caffeine  Amount  Time of last Intake  Comments          Coffee  Several/day  evening       Soda  2/day    Diet Pepsi     Tea  Rare              Energy Drinks  None              Over- the - counter stimulant pills  None              Other Substances                Alcohol  None  Tobacco  None         Drugs  None              Other:  None               Medications:     Current Outpatient Medications on File Prior to Visit          Medication  Sig  Dispense  Refill           ?  fluticasone propionate (FLONASE) 50 mcg/actuation nasal spray           ?  iron dextran (Infed) 50 mg/mL injection  975 mg by IntraVENous route.         ?  predniSONE (STERAPRED DS) 10 mg dose pack  See administration instruction per 10mg  dose pack  21 Tablet  0     ?  levothyroxine (SYNTHROID) 150 mcg tablet  TAKE 1 TABLET EVERY DAY BEFORE BREAKFAST (NEED MD APPOINTMENT)  90 Tablet  0     ?  gabapentin (NEURONTIN) 300 mg capsule  TAKE 1 CAPSULE EVERY MORNING AND TAKE 2 CAPSULES EVERY NIGHT  270 Capsule  2     ?  valsartan-hydroCHLOROthiazide (DIOVAN-HCT) 160-25 mg per tablet  TAKE 1 TABLET EVERY DAY  90 Tablet  1     ?  omeprazole (PRILOSEC) 40 mg capsule  Take 40 mg by mouth daily.         ?  lidocaine (LIDODERM) 5 %  1 Patch by TransDERmal route every twenty-four (24) hours. 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  atorvastatin (LIPITOR) 40 mg tablet  TAKE 1 TABLET EVERY DAY (NEED MD APPOINTMENT)  90 Tablet  1     ?  montelukast (Singulair) 10 mg tablet  Take 1 Tablet by mouth daily.  90 Tablet  3     ?  ZINC PO  Take  by mouth.         ?  albuterol (PROVENTIL HFA, VENTOLIN HFA, PROAIR HFA) 90 mcg/actuation inhaler  TAKE 2 PUFFS BY INHALATION EVERY FOUR (4) HOURS AS NEEDED FOR WHEEZING.  1 Inhaler  5     ?  potassium chloride (KLOR-CON) 10 mEq tablet  TAKE 1 TABLET EVERY DAY  90 Tablet  3     ?  vit B complx C/folic acid/zinc (VIT B COMPLEX-C-FOLIC AC-ZINC PO)  Take 1 Tab by mouth daily.         ?  latanoprost (XALATAN) 0.005 % ophthalmic solution  Administer 1 Drop to both eyes nightly.         ?  multivitamin (ONE A DAY) tablet  Take 1 Tab by mouth daily.         ?  cholecalciferol (VITAMIN D3) (5000 Units/125 mcg) tab tablet  Take  by mouth daily.         ?  famotidine, PF, (PEPCID) 20 mg/2 mL soln injection  20 mg by IntraVENous route. (Patient not taking: Reported on 10/02/2021)         ?  terbinafine HCL (LAMISIL) 250 mg tablet  TAKE 1 TABLET EVERY DAY (Patient not taking: No sig reported)  90 Tablet  0     ?  raNITIdine hcl 150 mg capsule  Take 150 mg by mouth nightly. (Patient not taking: No sig reported)               ?  coffee xt/phosphatidyl serine (NEURIVA  ORIGINAL PO)  Take  by mouth. (Patient not taking: No sig reported)              No current facility-administered medications on file prior to visit.            Allergy:     Allergies        Allergen  Reactions         ?  Aspirin  Other (comments)             GI distress,          ?  Asa-Acetaminophen-Caff-Potass  Other (comments)             Bleeding in stomach         ?  Benadryl [Diphenhydramine Hcl]  Other (comments)             Muscle jerking         ?  Other Plant, Higher education careers adviser, Environmental  Not Reported This Time             Grass, Dust and Mold         ?  Pollen Extracts  Not Reported This Time           Review of Systems    General ROS: negative for - chills, fever, hot flashes, malaise or night sweats   ENT ROS: negative for - epistaxis, headaches, hearing change, nasal discharge, nasal polyps, oral lesions, sinus pain, sneezing, sore throat, tinnitus, vertigo or visual changes   Hematological and Lymphatic ROS: negative for - bleeding problems, blood clots, bruising, jaundice, pallor or swollen lymph nodes   Endocrine ROS: negative for - polydipsia/polyuria, skin changes, temperature intolerance or unexpected weight changes   Respiratory ROS: no cough, shortness of breath, + wheezing- episodic- none today   Cardiovascular ROS: no chest pain or dyspnea on exertion   Gastrointestinal ROS: no abdominal pain, change in bowel habits, or black or bloody stools   Genito-Urinary ROS: no dysuria, trouble voiding, or hematuria   Musculoskeletal ROS: negative   Neurological ROS: no TIA or stroke symptoms   Dermatological ROS: negative for - pruritus, rash or skin lesion changes    Psychological ROS: asa bove    Otherwise negative.         Physical Exam:   Blood pressure (!) 166/76, pulse 66, temperature 97.6 ??F (36.4 ??C), temperature  source Oral, resp. rate 22, height 5\' 2"  (1.575 m), weight 84.8 kg (187 lb), SpO2 97 %.on RA, Body mass index is 34.2 kg/m??.        General: No distress, acyanotic, appears stated age, cooperative, pleasant   HEENT: PERRL, EOMI, throat without erythema or exudate, Tongue- large - without dental  indention on tongue, upper dentures in place, Mallampati's score 3+, Uvula- midline, Tonsils- not seen,    Neck: Supple,  no abnormally enlarged lymph nodes, thyroid is not enlarged, non-tender, No JVD No Carotid bruit   Chest: Grossly normal.   Lungs: Moderate air entry, clear to auscultation bilaterally- Good effort- no wheezing  or upper airway sounds   Heart: Regular rate and rhythm, S1S2 present, without murmur.   Abdomen: Protuberant,abdomen is soft without significant tenderness, or guarding.   Extremity: Negative  for cyanosis, edema, or clubbing.   Skin: Skin color, texture, turgor normal. No rashes or lesions.   Neurological: CN 2-12 grossly intact, normal muscle tone.      Data Reviewed:   CBC:      Lab Results  Component  Value  Date/Time            WBC  4.6  09/11/2021 10:30 AM       HGB  11.1  09/11/2021 10:30 AM       HCT  34.0  09/11/2021 10:30 AM       PLATELET  312  09/11/2021 10:30 AM            MCV  80  09/11/2021 10:30 AM        Results for THAILYN, KHALID (MRN [id number]) as of 07/17/2019 11:33             Ref. Range  04/27/2015 00:00  02/17/2016 00:00  07/03/2016 01:20  10/24/2016 07:00  01/10/2018 16:34             ABS. EOSINOPHILS  Latest Ref Range: 0.0 - 0.4 K/UL  0.2  0.2    0.1  0.2        Results for SIMMIE, GARIN (MRN [id number]) as of 02/24/2020 00:21             Ref. Range  07/09/2014 12:00  04/27/2015 00:00  02/17/2016 00:00  10/24/2016 07:00  01/10/2018 16:34     EOSINOPHILS  Latest Ref Range: 0 - 5 %  3  3  2  2  2              ABS. EOSINOPHILS  Latest Ref Range: 0.0 - 0.4 K/UL  0.1  0.2  0.2  0.1  0.2        BMP:      Lab Results         Component  Value  Date/Time            Sodium  141  09/11/2021 10:30 AM       Potassium  3.9  09/11/2021 10:30 AM       Chloride  98  09/11/2021 10:30 AM       CO2  28  09/11/2021 10:30 AM       Anion gap  2 (L)  05/26/2021 08:05 AM       Glucose  80  09/11/2021 10:30 AM       BUN  6 (L)  09/11/2021 10:30 AM       Creatinine  0.59  09/11/2021 10:30 AM       BUN/Creatinine ratio  10 (L)  09/11/2021 10:30 AM       GFR est AA  >60  05/26/2021 08:05 AM       GFR est non-AA  >60  05/26/2021 08:05 AM            Calcium  10.2  09/11/2021 10:30 AM              Lab Results         Component  Value  Date/Time            TSH  6.160 (H)  01/03/2021 12:00 AM            T4, Free  1.3  03/29/2017 05:30 PM          Lab Results         Component  Value  Date/Time            Hemoglobin A1c  5.4  02/27/2020 08:33 AM            Hemoglobin A1c (POC)  5.3  05/26/2021 02:25 PM  Imaging:   [x] I have  personally reviewed the patients radiographs section    Results from Hospital Encounter encounter on 09/23/21      XR HIP LT W OR WO PELV 2-3 VWS      Narrative   EXAM:  XR HIP LT W OR WO PELV 2-3 VWS      INDICATION: 70 year old female, Left hip pain, history of osteoarthritis      COMPARISON: Bilateral hip radiographs 04/07/2010.      FINDINGS: An AP view of the pelvis and a frogleg lateral view of the left hip      No fracture or dislocation. Mild degenerative changes of the bilateral hips and   sacroiliac joints.      Sclerotic focus at the right femoral neck appears similar compared to prior.      Impression   No acute findings. Mild degenerative changes. Right femoral neck sclerotic   benign-appearing lesion.      Dictated by Maricela Bo MD, PGY-2      As attending radiologist, I have assessed the study images and dictated or   reviewed/edited the final report as needed         Results from [place] encounter on 01/10/18      CT HEAD WO CONT      Narrative   EXAMINATION: CT head without contrast      INDICATION: Dizziness, fatigue      COMPARISON: None      TECHNIQUE: CT of the head performed without contrast, with multiplanar   reformations. All CT scans at this facility are performed using dose   optimization technique as appropriate to a performed exam, to include automated   exposure control, adjustment of the mA and/or kV according to patient size   (including appropriate matching first site specific examinations), or use of   iterative reconstruction technique.      FINDINGS: No focal mass effect or shift of midline structures. No abnormal   extra-axial collection identified. Ventricles are normal in size and   configuration. No evidence of acute intracranial hemorrhage. No suspicious   parenchymal lesions. Calvarium intact. Imaged paranasal sinuses and mastoids are   well-aerated. Superficial soft tissues unremarkable.      Impression   IMPRESSION:      No acute  intracranial findings. Of note, MRI is more sensitive in the detection   of acute infarct.          Historical Sleep Testing Data:   ??  HST: 04/07/19: AHI: 25.7, CAI: 2.1, ODI: 18.7, SpO2 nadir: 58%, SpO2 Avg: 90%- Moderate  Complex Sleep Apnea             Cecil Cranker, DO, FCCP   Pulmonary, Sleep and Critical Care Medicine

## 2021-10-07 ENCOUNTER — Encounter: Payer: MEDICARE | Primary: Internal Medicine

## 2021-10-07 ENCOUNTER — Ambulatory Visit: Admit: 2021-10-07 | Payer: MEDICARE | Attending: Orthopaedic Surgery | Primary: Internal Medicine

## 2021-10-07 ENCOUNTER — Encounter

## 2021-10-07 ENCOUNTER — Ambulatory Visit: Attending: Orthopaedic Surgery | Primary: Internal Medicine

## 2021-10-07 DIAGNOSIS — M5459 Other low back pain: Secondary | ICD-10-CM

## 2021-10-07 DIAGNOSIS — M1612 Unilateral primary osteoarthritis, left hip: Principal | ICD-10-CM

## 2021-10-07 NOTE — Progress Notes (Signed)
 PT DAILY TREATMENT NOTE 06-21    Patient Name: Elaine [name]  Date:10/07/2021  DOB: [birth year]  [x]   Patient DOB Verified  Payor: HUMANA MEDICARE / Plan: [place] [employer] HMO / Product Type: Managed Care Medicare /    In time:1035  Out time:1115  Total Treatment Time (min): 40  Visit #: 7 of 8    Medicare/BCBS Only   Total Timed Codes (min):  40 1:1 Treatment Time:  40       Treatment Area: Other low back pain [M54.59]    SUBJECTIVE  Pain Level (0-10 scale): 4-5/10  Any medication changes, allergies to medications, adverse drug reactions, diagnosis change, or new procedure performed?: [x]  No    []  Yes (see summary sheet for update)  Subjective functional status/changes:   []  No changes reported  Pt stated that her pain comes and goes and she has shooting pains then they go away    OBJECTIVE    25 min Therapeutic Exercise:  [x]  See flow sheet :   Rationale: increase ROM and increase strength to improve the patient's ability to increase ease with ADLs     15 min Neuromuscular Re-education:  [x]   See flow sheet : core re-education   Rationale: increase strength, improve coordination, improve balance, and increase proprioception  to improve the patient's ability to increase ease with ADLs          With   [x]  TE   []  TA   []  neuro   []  other: Patient Education: [x]  Review HEP    []  Progressed/Changed HEP based on:   []  positioning   []  body mechanics   []  transfers   []  heat/ice application    []  other:      Other Objective/Functional Measures:   No difficulty with exercises  No complaint of increased pain during session    Pain Level (0-10 scale) post treatment: 0/10    ASSESSMENT/Changes in Function:   Pt is making slow progress toward goals. Pt cont to use SPC with ambulation. Cont with decreased strength in B LE and hips. Cont to report mild to moderate low back pain.     Patient will continue to benefit from skilled PT services to modify and progress therapeutic interventions, address functional mobility  deficits, address ROM deficits, address strength deficits, analyze and cue movement patterns, analyze and modify body mechanics/ergonomics, assess and modify postural abnormalities, address imbalance/dizziness, and instruct in home and community integration to attain remaining goals.     [x]   See Plan of Care  []   See progress note/recertification  []   See Discharge Summary         Progress towards goals / Updated goals:  Short Term Goals: To be accomplished in 1 weeks:  Patient will demonstrate compliance with HEP in order to improve LE strength for increased ease of ambulation.   Current: compliant (09/19/21)      Long Term Goals: To be accomplished in 4 weeks:  Goal: Patient will improve FOTO score by 21 points in order to demonstrate a significant improvement in function.   Status at evaluation/last progress note: 39 points      2.   Goal: Patient will improve B hip extension MMT to 4-/5 in order to increase ease of ambulation.   Status at evaluation/last progress note: B: 3/5      3.   Goal: Patient will improve left hip left hip ER/IR AROM to 20 degrees in order to increase ease of transfers.  Status at evaluation/last progress note: IR: 18 degrees ER: 10 degrees      4.   Goal: Patient will report a 50% improvement in symptoms since [facility] in order to improve quality of life.   Status at evaluation/last progress note: n/a  Pt. Reports having less pain today (09/29/21)    PLAN  []   Upgrade activities as tolerated     [x]   Continue plan of care  []   Update interventions per flow sheet       []   Discharge due to:_  []   Other:_      Berwyn Quince, PTA 10/07/2021  9:20 AM    Future Appointments   Date Time Provider Department Center   10/07/2021 10:30 AM Quince Berwyn, PTA [facility]   10/07/2021  3:30 PM Westcott, Morene ORN, DO [facility] AMB   10/09/2021  3:45 PM Randell Almarie LABOR, PTA [facility]   10/13/2021  3:30 PM Alethea Dowdy, MD [facility] AMB   10/14/2021  3:45 PM Randell Almarie LABOR, PTA [facility]    11/17/2021  3:30 PM [facility] LAB VISIT [facility] AMB   11/24/2021  3:40 PM Hunte, Marcus CROME, MD [facility] AMB   06/19/2022  9:00 AM Charlanne Nolan POUR, MD [facility] BS AMB

## 2021-10-07 NOTE — Progress Notes (Signed)
Progress Notes by Merdis Delay, DO at 10/07/21 1530                Author: Merdis Delay, DO  Service: --  Author Type: Physician       Filed: 10/07/21 1608  Encounter Date: 10/07/2021  Status: Signed          Editor: Merdis Delay, DO (Physician)                             Patient: Elaine [name]                MRN: [id number]        SSN: [id number]   Date of Birth: [birth year]        AGE:  70 y.o.        SEX: female   Body mass index is 35.34 kg/m??.      PCP: Thana Farr, MD   10/07/21   Occupation:       Chief Complaint: Hip Pain (Left hip pain)         No diagnosis found.      HPI: Elaine [name] is a 70 y.o. female patient with Hip Pain (Left hip pain)   This started about 3 weeks ago.  She did not have any pain prior to that she just woke up 1 morning and all of a sudden had pain.  It did not wake her from sleep.  The pain is deep in her groin on the left side.  It hurts when she walks she will occasionally  get pains running down her leg.  She has seen Dr. Vinson Moselle in the past for back pain.        Tobacco Use: Medium Risk        ?  Smoking Tobacco Use: Former     ?  Smokeless Tobacco Use: Never        ?  Passive Exposure: Not on file              PHYSICAL EXAMINATION:   Visit Vitals      Pulse  89     Resp  18     Ht  5\' 2"  (1.575 m)     Wt  193 lb 3.2 oz (87.6 kg)        BMI  35.34 kg/m??        Body mass index is 35.34 kg/m??.   GENERAL: Alert and oriented x3, in no acute distress.   HEENT: Normocephalic, atraumatic.     MSK: Left Hip Exam        Tenderness    The patient is experiencing tenderness in the anterior.      Range of Motion    Flexion:  90    External rotation:  30    Internal rotation: 10       Muscle Strength    Abduction: 5/5    Adduction: 5/5    Flexion: 5/5       Tests    FABER: negative      Other    Erythema: absent   Scars: absent   Sensation: normal   Pulse: present      Comments:  Tenderness palpation in the medial groin in the area of the pubic  symphysis.      Positive FADIR test      Resisted hip flexion does not cause pain in the groin.  Negative logroll                IMAGING:   Imaging read by myself and interpreted as follows:   Left hip x-rays including AP pelvis and frog-leg lateral of the left hip demonstrate mild joint space narrowing in the left hip with sclerosis and osteophytosis.  There are no bony lesions near the  pubic symphysis or any other location.  No fractures are evident.      ASSESSMENT & PLAN   Diagnosis: 70 y.o. female  with left hip osteoarthritis.  This seems like an acute on chronic exacerbation.  She does have pain into the pubic symphysis area I would like to confirm that the pain is  in the hip joint with a diagnostic corticosteroid injection under fluoroscopic guidance.  I will have the patient anti-inflammatories or other medications however she says she has not wanted to take anything other than Tylenol.  She was given a prednisone  taper by her primary care which did a great job taking away her pain but then came back 2 days after she completed it.  Like to see her couple weeks after her injection is placed.  I will get repeat weightbearing films of the left hip at that time.           Past Medical History:        Diagnosis  Date         ?  Anemia       ?  Asthma       ?  Colon polyp       ?  DDD (degenerative disc disease)  02/15/2009     ?  Depression  12/18/2011     ?  Essential hypertension, benign       ?  History of echocardiogram  03/20/2004          EF 70%.  Borderline DDfx.  No significant valvular pathology.         ?  Hypercholesterolemia       ?  Hypertension       ?  Iron deficiency anemia  02/15/2009     ?  Lichen planus  3/66/4403     ?  Lower extremity venous duplex  10/23/2009          Left leg:  No DVT.           ?  Meralgia paraesthetica  10/22/2013     ?  OA (osteoarthritis of spine)  02/15/2009     ?  OA (osteoarthritis of spine)  02/15/2009     ?  Obesity, unspecified       ?  Other and unspecified  hyperlipidemia       ?  Pre-operative cardiovascular examination            For spine surgery         ?  Right sided sciatica       ?  Shortness of breath            Possible asthma, HCVD; less likely CAD (Noted 03/15/09)         ?  Sleep apnea  02/15/2009          uses cpap machine         ?  Thallium stress test   03/20/2004          Partially transient, mod basal & mid anterior defect most c/w artifact; mild anterior ischemia less likely.  Neg  EKG on pharm stress test.         ?  Thyroid disease            hypothyroidism             Family History         Problem  Relation  Age of Onset          ?  Cancer  Mother       ?  Hypertension  Mother       ?  Hypertension  Sister                x3          ?  Heart Surgery  Sister       ?  Heart Disease  Sister       ?  Heart Attack  Sister       ?  Cancer  Maternal Aunt                breast          ?  Breast Cancer  Maternal Aunt       ?  Glaucoma  Son       ?  Heart defect  Son       ?  Diabetes  Paternal Uncle            ?  Hypertension  Paternal Uncle               Current Outpatient Medications          Medication  Sig  Dispense  Refill           ?  famotidine, PF, (PEPCID) 20 mg/2 mL soln injection  20 mg by IntraVENous route. (Patient not taking: Reported on 10/02/2021)         ?  fluticasone propionate (FLONASE) 50 mcg/actuation nasal spray           ?  iron dextran (Infed) 50 mg/mL injection  975 mg by IntraVENous route.         ?  fluticasone-umeclidin-vilanter (Trelegy Ellipta) 200-62.5-25 mcg inhaler  Take 1 Puff by inhalation daily.  1 Each  3     ?  predniSONE (STERAPRED DS) 10 mg dose pack  See administration instruction per 10mg  dose pack  21 Tablet  0     ?  levothyroxine (SYNTHROID) 150 mcg tablet  TAKE 1 TABLET EVERY DAY BEFORE BREAKFAST (NEED MD APPOINTMENT)  90 Tablet  0     ?  terbinafine HCL (LAMISIL) 250 mg tablet  TAKE 1 TABLET EVERY DAY (Patient not taking: No sig reported)  90 Tablet  0     ?  gabapentin (NEURONTIN) 300 mg capsule  TAKE 1  CAPSULE EVERY MORNING AND TAKE 2 CAPSULES EVERY NIGHT  270 Capsule  2     ?  valsartan-hydroCHLOROthiazide (DIOVAN-HCT) 160-25 mg per tablet  TAKE 1 TABLET EVERY DAY  90 Tablet  1     ?  raNITIdine hcl 150 mg capsule  Take 150 mg by mouth nightly. (Patient not taking: No sig reported)         ?  omeprazole (PRILOSEC) 40 mg capsule  Take 40 mg by mouth daily.         ?  lidocaine (LIDODERM) 5 %  1 Patch by TransDERmal route every twenty-four (24) hours. 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  atorvastatin (  LIPITOR) 40 mg tablet  TAKE 1 TABLET EVERY DAY (NEED MD APPOINTMENT)  90 Tablet  1     ?  montelukast (Singulair) 10 mg tablet  Take 1 Tablet by mouth daily.  90 Tablet  3     ?  ZINC PO  Take  by mouth.         ?  coffee xt/phosphatidyl serine (NEURIVA ORIGINAL PO)  Take  by mouth. (Patient not taking: No sig reported)         ?  albuterol (PROVENTIL HFA, VENTOLIN HFA, PROAIR HFA) 90 mcg/actuation inhaler  TAKE 2 PUFFS BY INHALATION EVERY FOUR (4) HOURS AS NEEDED FOR WHEEZING.  1 Inhaler  5     ?  potassium chloride (KLOR-CON) 10 mEq tablet  TAKE 1 TABLET EVERY DAY  90 Tablet  3     ?  vit B complx C/folic acid/zinc (VIT B COMPLEX-C-FOLIC AC-ZINC PO)  Take 1 Tab by mouth daily.         ?  latanoprost (XALATAN) 0.005 % ophthalmic solution  Administer 1 Drop to both eyes nightly.         ?  multivitamin (ONE A DAY) tablet  Take 1 Tab by mouth daily.               ?  cholecalciferol (VITAMIN D3) (5000 Units/125 mcg) tab tablet  Take  by mouth daily.                  Allergies        Allergen  Reactions         ?  Aspirin  Other (comments)             GI distress,          ?  Asa-Acetaminophen-Caff-Potass  Other (comments)             Bleeding in stomach         ?  Benadryl [Diphenhydramine Hcl]  Other (comments)             Muscle jerking         ?  Other Plant, Higher education careers adviser, Environmental  Not Reported This Time             Grass, Dust and Mold         ?  Pollen Extracts  Not Reported This Time              Past Surgical History:         Procedure  Laterality  Date          ?  COLONOSCOPY  N/A  04/25/2020          SCREENING COLONOSCOPY performed by Sloan Leiter, MD at [facility] ENDOSCOPY          ?  HX COLONOSCOPY    02-24-12          normal (Dr. Angelique Holm)          ?  HX HERNIA REPAIR              3x          ?  HX HYSTERECTOMY         ?  HX TUBAL LIGATION         ?  NEUROLOGICAL PROCEDURE UNLISTED    03-16-2009          s/p ACD & Fusion (Dr.P.Gurtner)          ?  PR COLONOSCOPY FLX DX W/COLLJ SPEC WHEN PFRMD    02-2007          +polyp(tubular adenoma); Dr Angelique Holm             Social History          Socioeconomic History         ?  Marital status:  DIVORCED              Spouse name:  Not on file         ?  Number of children:  Not on file     ?  Years of education:  Not on file     ?  Highest education level:  Not on file       Occupational History        ?  Not on file       Tobacco Use         ?  Smoking status:  Former              Packs/day:  1.00         Years:  28.00         Pack years:  28.00         Types:  Cigarettes         Start date:  10/16/1977         Quit date:  04/25/2004         Years since quitting:  17.4         ?  Smokeless tobacco:  Never       Substance and Sexual Activity         ?  Alcohol use:  No     ?  Drug use:  No     ?  Sexual activity:  Not on file        Other Topics  Concern        ?  Not on file       Social History Narrative        ?  Not on file          Social Determinants of Health          Financial Resource Strain: Not on file     Food Insecurity: Not on file     Transportation Needs: Not on file     Physical Activity: Not on file     Stress: Not on file     Social Connections: Not on file     Intimate Partner Violence: Not on file       Housing Stability: Not on file           REVIEW OF SYSTEMS:        No changes from previous review of systems unless noted.         Prescription medication management discussed with patient.       Electronically signed by: Merdis Delay, DO      Note:  This note was completed using voice recognition software.  Any typographical/name errors or mistakes are unintentional.

## 2021-10-08 NOTE — Progress Notes (Signed)
Progress Notes by Melissa Montane, DO at 10/08/21 1302                Author: Melissa Montane, DO  Service: --  Author Type: Physician       Filed: 10/08/21 1423  Encounter Date: 10/08/2021  Status: Signed          Editor: Melissa Montane, DO (Physician)               Patient was last seen in our office on 10/02/21.  At that time, I had received a verbal report that the patient did not obtain her allergy- asthma shots from her ENT provider due  to low oxygen status.  The patient reported that she was surprised and that she felt well at the time.  We performed a 6MW which was unremarkable and there was no indication for oxygen.      During that evaluation, the patient reported that she had stopped her inhaler therapy as she did not note and subjective benefit and she felt she was taking too many medications.      I asked the patient to restart her Trelegy therapy.      Our office received qualification from ENT office [facility] dated 09/22/21.  They report that it was not an issue about SpO2 but that the patient[s peak flow 49% and 52% and that the patient needs to have a  peak flow of at least 80% in order to receive her allergy shot.         Cecil Cranker, DO, FCCP        [facility]   Pulmonary, Critical Care, and Sleep Medicine

## 2021-10-09 ENCOUNTER — Encounter: Payer: MEDICARE | Primary: Internal Medicine

## 2021-10-09 NOTE — Other (Addendum)
Therapy Recertification by Joline Salt, PT at 10/09/21 1545                Author: Joline Salt, PT  Service: Physical Therapy  Author Type: Physical Therapist       Filed: 10/10/21 1155  Date of Service: 10/09/21 1545  Status: Addendum          Editor: Joline Salt, PT (Physical Therapist)       Related Notes: Original Note by Sanjuan Dame, PTA (Physical Therapy Assistant) filed  at 10/09/21 1711          Cosigner: Charm Barges, MD at 10/10/21 1332               [facility]   [address] [place] [address]   [phone]  [phone] fax      Continued Plan of Care/ Re-certification for Physical Therapy Services         Patient name: Elaine [name]  Start of Care: 09/11/2021        Referral source: [facility]*  DOB: [birth year]     Medical/Treatment Diagnosis: Other low back pain [M54.59]   Payor: HUMANA MEDICARE / Plan: [place] HMO / Product Type: Managed Care Medicare /   Onset Date:15 years ago with worsening symptoms over the past year           Prior Hospitalization: see medical history  Provider#: [id number]        Medications: Verified on Patient Summary List       Comorbidities: chiari malformation, arthritis, HTN, asthma   Prior Level of Function: ambulation with SPC, Ind with ADLs, working as a Oncologist   Visits from [employer] of Care: 8    Missed Visits:  1      The Plan of Care and following information is based on the patient's current status:   Short Term Goals: To be accomplished in 1 weeks:   1.  Patient will demonstrate compliance with HEP in order to improve LE strength for increased ease of ambulation.    Current: compliant (09/19/21)        Long Term Goals: To be accomplished in 4 weeks:   1.  Goal: Patient will improve FOTO score by 21 points in order to demonstrate a significant improvement in function.    Status at evaluation/last progress note: 39 points     Current: 38/100 (-1) (10/09/21)       2.   Goal: Patient will improve B hip extension MMT to 4-/5 in order to increase ease of ambulation.    Status at evaluation/last progress note: B: 3/5    Current: 3+/5 (10/09/21)       3.   Goal: Patient will improve left hip left hip ER/IR AROM to 20 degrees in order to increase ease of transfers.    Status at evaluation/last progress note: IR: 18 degrees ER: 10 degrees    Current: IR: 25*, ER 30* (10/09/21)       4.   Goal: Patient will report a 50% improvement in symptoms since [facility] in order to improve quality of life.    Status at evaluation/last progress note: n/a   Current: Pt. Reports having less pain today (09/29/21)., MET: 70% (10/09/21)      Key functional changes: one point decrease in FOTO score, 1/2 grade increase in hip ext strength, 70% subjective improvement, slow increase in standing/walking tolerance  Problems/ barriers to goal attainment: none       Problem List: pain affecting function, decrease ROM, decrease strength, impaired gait/ balance, decrease ADL/ functional abilitiies, decrease activity  tolerance, decrease flexibility/ joint mobility, and decrease transfer abilities       Treatment Plan: Therapeutic exercise, Therapeutic activities, Neuromuscular re-education, Physical agent/modality, Gait/balance training, Manual  therapy, Patient education, Self Care training, Functional mobility training, Home safety training, and Stair training       Patient Goal (s) has been updated and includes: "to be able to walk without pain and without hopping"        Goals for this certification period to be accomplished in 4 weeks:   1.  Patient will demonstrate compliance with updated HEP in order to improve LE strength for increased ease of ambulation.    Status at evaluation/last progress note: new goal       2. Goal: Patient will improve FOTO score by 21 points in order to demonstrate a significant improvement in function.    Status at evaluation/last progress  note: 38 points        3.   Goal: Patient will improve B hip extension MMT to 4-/5 in order to increase ease of ambulation.    Status at evaluation/last progress note: B: 3+/5        4.   Goal: Patient will report a 80% improvement in symptoms since [facility] in order to improve quality of life.    Status at evaluation/last progress note: 70%      5. Goal: Patient will negotiate stairs with step to pattern with BHRs in order to increase safety and improve quality of life.    Status at evaluation/last progress note: new goal      Frequency / Duration: Patient to be seen 2-3 times per week for 4 weeks:      Assessment / Recommendations:   Ms. Sobocinski is making slow progress towards her goals. She continues to ambulate with forward flexed posture and antalgic gait using SPC. She continues to demonstrate decreased core and hip strength. She ascends stairs crawling on her hands and knees and  descends stairs sliding down on her butt. Her current standing/waling tolerance is < 15 minutes and she is able to sit as long as she likes without pain now.        Pt reports 70% overall improvement with functional ADL's since beginning PT. [name] pain range 0-8/10 mostly with walking.       Certification Period: 10/10/2021-11/08/2021      Sanjuan Dame, PTA   10/09/2021   5:10 PM      ________________________________________________________________________   I certify that the above Therapy Services are being furnished while the patient is under my care. I agree with the treatment plan and certify that this therapy is necessary.      '[]'   I have read the above and request that my patient continue as recommended.   '[]'   I have read the above report and request that my patient continue therapy with the following changes/special instructions: _______________________________________   '[]'   I have read the above report and request that my patient be discharged from therapy      Physician's Signature:____________Date :_________TIME:________       [facility]ney Living*   ** Signature, Date and Time must be completed for valid certification **      Please sign and return to [facility] -  [facility]   [address] [place] [address]   [phone]  [phone] fax

## 2021-10-09 NOTE — Progress Notes (Signed)
 PT DAILY TREATMENT NOTE 06-21    Patient Name: Elaine [name]  Date:10/09/2021  DOB: December 29, 1950  [x]   Patient DOB Verified  Payor: HUMANA MEDICARE / Plan: [employer] MEDICARE HMO / Product Type: Managed Care Medicare /    In time: 3:50  Out time: 4:28  Total Treatment Time (min): 38  Visit #: 8 of 8    Medicare/BCBS Only   Total Timed Codes (min): 38 1:1 Treatment Time: 38       Treatment Area: Other low back pain [M54.59]    SUBJECTIVE  Pain Level (0-10 scale): 4/10  Any medication changes, allergies to medications, adverse drug reactions, diagnosis change, or new procedure performed?: [x]  No    []  Yes (see summary sheet for update)  Subjective functional status/changes:   []  No changes reported  Pt stated that she didn't have any pain until she started walking on it.     OBJECTIVE    15 min Therapeutic Exercise:  [x]  See flow sheet :   Rationale: increase ROM and increase strength to improve the patient's ability to increase ease with ADLs     8 min Neuromuscular Re-education:  [x]   See flow sheet : core re-education   Rationale: increase strength, improve coordination, improve balance, and increase proprioception  to improve the patient's ability to increase ease with ADLs          15 As   []  TE   [x]  TA   []  neuro   []  other: Patient Education: [x]  Review HEP    []  Progressed/Changed HEP based on:   []  positioning   []  body mechanics   []  transfers   []  heat/ice application    [x]  other: FOTO and re-assess goals     Other Objective/Functional Measures:   FOTO: 38/100 (-1)    Pain Level (0-10 scale) post treatment: 0/10    ASSESSMENT/Changes in Function:  SEE RECERTIFICATION    Patient will continue to benefit from skilled PT services to modify and progress therapeutic interventions, address functional mobility deficits, address ROM deficits, address strength deficits, analyze and cue movement patterns, analyze and modify body mechanics/ergonomics, assess and modify postural abnormalities, address  imbalance/dizziness, and instruct in home and community integration to attain remaining goals.     []   See Plan of Care  [x]   See progress note/recertification  []   See Discharge Summary         Progress towards goals / Updated goals:   Short Term Goals: To be accomplished in 1 weeks:  Patient will demonstrate compliance with HEP in order to improve LE strength for increased ease of ambulation.   Current: compliant (09/19/21)      Long Term Goals: To be accomplished in 4 weeks:  Goal: Patient will improve FOTO score by 21 points in order to demonstrate a significant improvement in function.   Status at evaluation/last progress note: 39 points   Current: 38/100 (-1) (10/09/21)     2.   Goal: Patient will improve B hip extension MMT to 4-/5 in order to increase ease of ambulation.   Status at evaluation/last progress note: B: 3/5   Current: 3+/5 (10/09/21)     3.   Goal: Patient will improve left hip left hip ER/IR AROM to 20 degrees in order to increase ease of transfers.   Status at evaluation/last progress note: IR: 18 degrees ER: 10 degrees   Current: IR: 25*, ER 30* (10/09/21)     4.   Goal: Patient will  report a 50% improvement in symptoms since [facility] in order to improve quality of life.   Status at evaluation/last progress note: n/a  Current: Pt. Reports having less pain today (09/29/21)., MET: 70% (10/09/21)    PLAN  []   Upgrade activities as tolerated     [x]   Continue plan of care  []   Update interventions per flow sheet       []   Discharge due to:_  [x]   Other: continue therapy 2-3 times per week for 4 weeks    Almarie DELENA Requena, PTA 10/09/2021  4:28 PM    Future Appointments   Date Time Provider Department Center   10/09/2021  3:45 PM Requena Almarie DELENA JOSETTA [facility]   10/13/2021  3:30 PM Alethea Dowdy, MD [facility] BS AMB   10/14/2021  3:45 PM Requena Almarie DELENA, PTA [facility]   10/16/2021 10:30 AM [facility] DX RM 1 [facility]   11/11/2021  1:00 PM Marlyse Morene ORN, DO [facility] BS AMB   11/17/2021  3:30 PM [facility] LAB VISIT  [facility] BS AMB   11/24/2021  3:40 PM Hunte, Marcus CROME, MD [facility] BS AMB   06/19/2022  9:00 AM Charlanne Nolan POUR, MD [facility] BS AMB

## 2021-10-13 ENCOUNTER — Encounter: Attending: Internal Medicine | Primary: Internal Medicine

## 2021-10-14 ENCOUNTER — Inpatient Hospital Stay: Payer: MEDICARE | Primary: Internal Medicine

## 2021-10-15 ENCOUNTER — Inpatient Hospital Stay: Admit: 2021-10-15 | Payer: MEDICARE | Primary: Internal Medicine

## 2021-10-15 NOTE — Progress Notes (Signed)
 PT DAILY TREATMENT NOTE 06-21    Patient Name: Elaine [name]  Date:10/15/2021  DOB: [birth year]  [x]   Patient DOB Verified  Payor: HUMANA MEDICARE / Plan: [employer] MEDICARE HMO / Product Type: Managed Care Medicare /    In time: 3:48   Out time: 4:28  Total Treatment Time (min): 40  Visit #: 1 of 8-12    Medicare/BCBS Only   Total Timed Codes (min): 40 1:1 Treatment Time: 40       Treatment Area: Other low back pain [M54.59]    SUBJECTIVE  Pain Level (0-10 scale): 3/10  Any medication changes, allergies to medications, adverse drug reactions, diagnosis change, or new procedure performed?: [x]  No    []  Yes (see summary sheet for update)  Subjective functional status/changes:   []  No changes reported  Pt stated that she is having a better day today, her hip is about a 3/10. She has a special test at the hospital on Tuesday for her hip.     OBJECTIVE    25 min Therapeutic Exercise:  [x]  See flow sheet :   Rationale: increase ROM and increase strength to improve the patient's ability to increase ease with ADLs     15 min Neuromuscular Re-education:  [x]   See flow sheet : core re-education   Rationale: increase strength, improve coordination, improve balance, and increase proprioception  to improve the patient's ability to increase ease with ADLs          With   [x]  TE   []  TA   []  neuro   []  other: Patient Education: [x]  Review HEP    []  Progressed/Changed HEP based on:   []  positioning   []  body mechanics   []  transfers   []  heat/ice application    []  other:      Other Objective/Functional Measures:   - added red TB to hip x 3 today  - challenged with 4 inch step ups with only 1UE    Pain Level (0-10 scale) post treatment: 0/10    ASSESSMENT/Changes in Function:    Patient tolerated progression of reps and resistance as noted on FS with no increase in pain and moderate fatigue. Patient continues to ambulate and stand with forward flexed posture, added hip flexor S. Will continue to progress as able while monitoring  sx.     Patient will continue to benefit from skilled PT services to modify and progress therapeutic interventions, address functional mobility deficits, address ROM deficits, address strength deficits, analyze and cue movement patterns, analyze and modify body mechanics/ergonomics, assess and modify postural abnormalities, address imbalance/dizziness, and instruct in home and community integration to attain remaining goals.     []   See Plan of Care  [x]   See progress note/recertification  []   See Discharge Summary         Goals for this certification period to be accomplished in 4 weeks:  Patient will demonstrate compliance with updated HEP in order to improve LE strength for increased ease of ambulation.   Status at evaluation/last progress note: new goal     2. Goal: Patient will improve FOTO score by 21 points in order to demonstrate a significant improvement in function.   Status at evaluation/last progress note: 38 points      3.   Goal: Patient will improve B hip extension MMT to 4-/5 in order to increase ease of ambulation.   Status at evaluation/last progress note: B: 3+/5      4.  Goal: Patient will report a 80% improvement in symptoms since [facility] in order to improve quality of life.   Status at evaluation/last progress note: 70%     5. Goal: Patient will negotiate stairs with step to pattern with BHRs in order to increase safety and improve quality of life.   Status at evaluation/last progress note: new goal    PLAN  []   Upgrade activities as tolerated     [x]   Continue plan of care  []   Update interventions per flow sheet       []   Discharge due to:_  []   Other:     Almarie DELENA Requena, PTA 10/15/2021  4:28 PM    Future Appointments   Date Time Provider Department Center   10/15/2021  3:45 PM Requena Almarie DELENA, PTA [facility]   10/16/2021 10:30 AM [facility] DX RM 1 [facility]   10/17/2021  3:45 PM Hudgins Myrna Rattan, PT [facility]   10/20/2021  8:00 AM Alethea Dowdy, MD [facility] BS AMB   10/21/2021  3:45 PM  Requena Almarie DELENA, PTA [facility]   10/23/2021  3:45 PM Requena Almarie DELENA, PTA [facility]   10/28/2021  3:45 PM Requena Almarie DELENA, PTA [facility]   11/04/2021  3:45 PM Requena Almarie DELENA, PTA [facility]   11/06/2021  3:45 PM Lynnea Senior, PT [facility]   11/11/2021  1:00 PM Westcott, Morene ORN, DO [facility] BS AMB   11/11/2021  3:45 PM Requena Almarie DELENA, PTA [facility]   11/17/2021  3:30 PM [facility] LAB VISIT [facility] BS AMB   11/24/2021  3:40 PM Hunte, Marcus CROME, MD [facility] BS AMB   06/19/2022  9:00 AM Charlanne Nolan POUR, MD CAP BS AMB

## 2021-10-16 ENCOUNTER — Inpatient Hospital Stay: Payer: MEDICARE | Attending: Orthopaedic Surgery | Primary: Internal Medicine

## 2021-10-16 MED ORDER — LIDOCAINE (PF) 10 MG/ML (1 %) IJ SOLN
10 mg/mL (1 %) | Freq: Once | INTRAMUSCULAR | Status: AC
Start: 2021-10-16 — End: 2021-10-16

## 2021-10-16 MED ORDER — TRIAMCINOLONE ACETONIDE 40 MG/ML SUSP FOR INJECTION
40 mg/mL | Freq: Once | INTRAMUSCULAR | Status: AC
Start: 2021-10-16 — End: 2021-10-16

## 2021-10-16 MED ORDER — IOPAMIDOL 41 % INTRATHECAL
200 mg iodine /mL (41 %) | Freq: Once | INTRATHECAL | Status: AC
Start: 2021-10-16 — End: 2021-10-16

## 2021-10-16 MED FILL — ISOVUE-M 200  41 % INTRATHECAL SOLUTION: 200 mg iodine /mL (41 %) | INTRATHECAL | Qty: 10

## 2021-10-17 ENCOUNTER — Encounter: Payer: MEDICARE | Primary: Internal Medicine

## 2021-10-20 ENCOUNTER — Ambulatory Visit: Admit: 2021-10-20 | Discharge: 2021-10-20 | Payer: MEDICARE | Attending: Internal Medicine | Primary: Internal Medicine

## 2021-10-20 ENCOUNTER — Ambulatory Visit: Attending: Internal Medicine | Primary: Internal Medicine

## 2021-10-20 DIAGNOSIS — M25552 Pain in left hip: Secondary | ICD-10-CM

## 2021-10-20 NOTE — Progress Notes (Signed)
Elaine [name] is a 70 y.o. year old female who is coming for a follow-up on left rib pain.        Subjective:   Elaine [name] was seen today for Follow-up hip pain.    Patient with left hip osteoarthritis, seen orthopedics on 11/1.  Plan to have diagnostic corticosteroid injection under fluoroscopic guidance on 11/15.    Labs within acceptable range.    Patient says the hip feels better.  Pain is improved.  Patient still needs to take Tylenol 650 mg 2-3 times a day as needed.        Past Medical History:   Diagnosis Date    Anemia     Asthma     Colon polyp     DDD (degenerative disc disease) 02/15/2009    Depression 12/18/2011    Essential hypertension, benign     History of echocardiogram 03/20/2004    EF 70%.  Borderline DDfx.  No significant valvular pathology.    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      Meralgia paraesthetica 10/22/2013    OA (osteoarthritis of spine) 02/15/2009    OA (osteoarthritis of spine) 02/15/2009    Obesity, unspecified     Other and unspecified hyperlipidemia     Pre-operative cardiovascular examination     For spine surgery    Right sided sciatica     Shortness of breath     Possible asthma, HCVD; less likely CAD (Noted 03/15/09)    Sleep apnea 02/15/2009    uses cpap machine    Thallium stress test  03/20/2004    Partially transient, mod basal & mid anterior defect most c/w artifact; mild anterior ischemia less likely.  Neg EKG on pharm stress test.    Thyroid disease     hypothyroidism       Past Surgical History:   Procedure Laterality Date    COLONOSCOPY N/A 04/25/2020    SCREENING COLONOSCOPY performed by Sloan Leiter, MD at [facility] ENDOSCOPY    HX COLONOSCOPY  02-24-12    normal (Dr. Angelique Holm)    HX HERNIA REPAIR      3x    HX HYSTERECTOMY      HX TUBAL LIGATION      NEUROLOGICAL PROCEDURE UNLISTED  03-16-2009    s/p ACD & Fusion (Dr.P.Gurtner)    PR  COLONOSCOPY FLX DX W/COLLJ SPEC WHEN PFRMD  02-2007    +polyp(tubular adenoma); Dr Angelique Holm       Family History   Problem Relation Age of Onset    Cancer Mother     Hypertension Mother     Hypertension Sister         x3    Heart Surgery Sister     Heart Disease Sister     Heart Attack Sister     Cancer Maternal Aunt         breast    Breast Cancer Maternal Aunt     Glaucoma Son     Heart defect Son     Diabetes Paternal Uncle     Hypertension Paternal Uncle        Social History     Socioeconomic History    Marital status: DIVORCED     Spouse name: Not on file    Number of children: Not on  file    Years of education: Not on file    Highest education level: Not on file   Occupational History    Not on file   Tobacco Use    Smoking status: Former     Packs/day: 1.00     Years: 28.00     Pack years: 28.00     Types: Cigarettes     Start date: 10/16/1977     Quit date: 04/25/2004     Years since quitting: 17.4    Smokeless tobacco: Never   Substance and Sexual Activity    Alcohol use: No    Drug use: No    Sexual activity: Not on file   Other Topics Concern    Not on file   Social History Narrative    Not on file     Social Determinants of Health     Financial Resource Strain: Not on file   Food Insecurity: Not on file   Transportation Needs: Not on file   Physical Activity: Not on file   Stress: Not on file   Social Connections: Not on file   Intimate Partner Violence: Not on file   Housing Stability: Not on file       Allergies   Allergen Reactions    Aspirin Other (comments)     GI distress,     Asa-Acetaminophen-Caff-Potass Other (comments)     Bleeding in stomach    Benadryl [Diphenhydramine Hcl] Other (comments)     Muscle jerking    Other Plant, Higher education careers adviser, Environmental Not Reported This Time     Grass, Dust and Mold    Pollen Extracts Not Reported This Time       Current Outpatient Medications on File Prior to Visit   Medication Sig Dispense Refill    fluticasone propionate (FLONASE) 50 mcg/actuation nasal spray        iron dextran (Infed) 50 mg/mL injection 975 mg by IntraVENous route.      fluticasone-umeclidin-vilanter (Trelegy Ellipta) 200-62.5-25 mcg inhaler Take 1 Puff by inhalation daily. 1 Each 3    levothyroxine (SYNTHROID) 150 mcg tablet TAKE 1 TABLET EVERY DAY BEFORE BREAKFAST (NEED MD APPOINTMENT) 90 Tablet 0    gabapentin (NEURONTIN) 300 mg capsule TAKE 1 CAPSULE EVERY MORNING AND TAKE 2 CAPSULES EVERY NIGHT 270 Capsule 2    valsartan-hydroCHLOROthiazide (DIOVAN-HCT) 160-25 mg per tablet TAKE 1 TABLET EVERY DAY 90 Tablet 1    raNITIdine hcl 150 mg capsule Take 150 mg by mouth nightly.      omeprazole (PRILOSEC) 40 mg capsule Take 40 mg by mouth daily.      lidocaine (LIDODERM) 5 % 1 Patch by TransDERmal route every twenty-four (24) hours. 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 90 Tablet 3    ZINC PO Take  by mouth.      coffee xt/phosphatidyl serine (NEURIVA ORIGINAL PO) Take  by mouth.      albuterol (PROVENTIL HFA, VENTOLIN HFA, PROAIR HFA) 90 mcg/actuation inhaler TAKE 2 PUFFS BY INHALATION EVERY FOUR (4) HOURS AS NEEDED FOR WHEEZING. 1 Inhaler 5    potassium chloride (KLOR-CON) 10 mEq tablet TAKE 1 TABLET EVERY DAY 90 Tablet 3    vit B complx C/folic acid/zinc (VIT B COMPLEX-C-FOLIC AC-ZINC PO) Take 1 Tab by mouth daily.  latanoprost (XALATAN) 0.005 % ophthalmic solution Administer 1 Drop to both eyes nightly.      multivitamin (ONE A DAY) tablet Take 1 Tab by mouth daily.      cholecalciferol (VITAMIN D3) (5000 Units/125 mcg) tab tablet Take  by mouth daily.      famotidine, PF, (PEPCID) 20 mg/2 mL soln injection 20 mg by IntraVENous route. (Patient not taking: Reported on 10/20/2021)      predniSONE (STERAPRED DS) 10 mg dose pack See administration instruction per 68m dose pack (Patient not taking: Reported on 10/20/2021) 21 Tablet 0     terbinafine HCL (LAMISIL) 250 mg tablet TAKE 1 TABLET EVERY DAY (Patient not taking: No sig reported) 90 Tablet 0     No current facility-administered medications on file prior to visit.        ROS as above        Objective:     Vitals:    10/20/21 0818   BP: 126/63   Pulse: 80   Resp: 16   Temp: 97.8 ??F (36.6 ??C)   TempSrc: Temporal   SpO2: 96%   Weight: 192 lb (87.1 kg)   Height: '5\' 2"'  (1.575 m)      Physical Exam  Cardiovascular:      Rate and Rhythm: Normal rate and regular rhythm.      Pulses: Normal pulses.      Heart sounds: Normal heart sounds.   Pulmonary:      Effort: Pulmonary effort is normal.      Breath sounds: Normal breath sounds.   Musculoskeletal:      Comments: Mild discomfort on movement left hip          Labs:     Results for orders placed or performed in visit on 09/11/21   CBC WITH AUTOMATED DIFF   Result Value Ref Range    WBC 4.6 3.4 - 10.8 x10E3/uL    RBC 4.24 3.77 - 5.28 x10E6/uL    HGB 11.1 11.1 - 15.9 g/dL    HCT 34.0 34.0 - 46.6 %    MCV 80 79 - 97 fL    MCH 26.2 (L) 26.6 - 33.0 pg    MCHC 32.6 31.5 - 35.7 g/dL    RDW 15.7 (H) 11.7 - 15.4 %    PLATELET 312 150 - 450 x10E3/uL    NEUTROPHILS 71 Not Estab. %    Lymphocytes 18 Not Estab. %    MONOCYTES 8 Not Estab. %    EOSINOPHILS 2 Not Estab. %    BASOPHILS 1 Not Estab. %    ABS. NEUTROPHILS 3.4 1.4 - 7.0 x10E3/uL    Abs Lymphocytes 0.8 0.7 - 3.1 x10E3/uL    ABS. MONOCYTES 0.4 0.1 - 0.9 x10E3/uL    ABS. EOSINOPHILS 0.1 0.0 - 0.4 x10E3/uL    ABS. BASOPHILS 0.0 0.0 - 0.2 x10E3/uL    IMMATURE GRANULOCYTES 0 Not Estab. %    ABS. IMM. GRANS. 0.0 0.0 - 0.1 xF09N2/TF  METABOLIC PANEL, BASIC   Result Value Ref Range    Glucose 80 70 - 99 mg/dL    BUN 6 (L) 8 - 27 mg/dL    Creatinine 0.59 0.57 - 1.00 mg/dL    eGFR 96 >59 mL/min/1.73    BUN/Creatinine ratio 10 (L) 12 - 28    Sodium 141 134 - 144 mmol/L    Potassium 3.9 3.5 - 5.2 mmol/L    Chloride 98 96 - 106 mmol/L    CO2 28  20 - 29 mmol/L    Calcium 10.2 8.7 - 10.3 mg/dL   VITAMIN D, 25  HYDROXY   Result Value Ref Range    VITAMIN D, 25-HYDROXY 53.5 30.0 - 100.0 ng/mL   VITAMIN B12   Result Value Ref Range    Vitamin B12 >2000 (H) 232 - 1245 pg/mL          Active Problems:     Patient Active Problem List    Diagnosis    Left hip pain    Muscle spasm of left lower extremity    Left leg pain    Sore throat    Memory problem    Severe obesity (BMI 35.0-39.9) with comorbidity ([place])    Chronic diastolic congestive heart failure ([place])     2/18 NYHA2, 70-75%EF, add coreg      Hospital discharge follow-up    Chest pain    Routine general medical examination at a health care facility    Need for pneumococcal vaccination    URI (upper respiratory infection)    Cough    Encounter for long-term (current) use of medications    Chronic midline low back pain without sciatica    Rash    Claustrophobia    Migraine without aura and without status migrainosus, not intractable    Dehydration    Chiari I malformation (HCC)    Post-menopausal    Osteoarthritis of left knee    Left knee pain    Swelling of left lower extremity    Need for hepatitis C screening test    Glaucoma screening    Abnormal stress test    Obesity (BMI 30-39.9)     Weight loss has been strongly encouraged by following dietary restrictions and an exercise routine.      Pre-diabetes    Meralgia paraesthetica    Depression    Sciatica    Submental lymphadenopathy    DOE (dyspnea on exertion)    Hypothyroidism    Pure hypercholesterolemia     chk lipids      HTN (hypertension), benign     controlled      GERD (gastroesophageal reflux disease)    Asthma    Vitamin D deficiency    Iron deficiency anemia     F/u PCP, get IV iron off & on         Assessment & Plan:     Diagnoses and all orders for this visit:    1. Left hip pain  Assessment & Plan:   To continue with orthopedics.  Going to for steroid shot under fluoroscopic guidance tomorrow.        Follow-up and Dispositions    Return if symptoms worsen or fail to improve.                    Disclaimer:    The patient understands our medical plan.  Alternatives have been explained and offered.  The risks, benefits and significant side effects of all medications have been reviewed. Anticipated time course and progression of condition reviewed. All questions have been addressed.  She is encouraged to employ the information provided in the after visit summary, which was reviewed.      Where applicable, she is instructed to call the clinic if she has not been notified either by phone or through [place] with the results of her tests or with an appointment plan for any referrals within 1 week(s).  No news is not good news; it's  no news. The patient  is to call if her condition worsens or fails to improve or if significant side effects are experienced.           Celine Mans, MD

## 2021-10-20 NOTE — Assessment & Plan Note (Signed)
To continue with orthopedics.  Going to for steroid shot under fluoroscopic guidance tomorrow.

## 2021-10-20 NOTE — Progress Notes (Signed)
 Elaine [name] presents today for   Chief Complaint   Patient presents with    Follow-up    Hypertension       1. Have you been to the ER, urgent care clinic since your last visit?  Hospitalized since your last visit? no    2. Have you seen or consulted any other health care providers outside of the [facility] since your last visit? no     3. For patients aged 72-75: Has the patient had a colonoscopy / FIT/ Cologuard? Yes - no Care Gap present      If the patient is female:    4. For patients aged 39-74: Has the patient had a mammogram within the past 2 years? Yes - Care Gap present. Rooming MA/LPN to request most recent results  See top three    5. For patients aged 21-65: Has the patient had a pap smear? NA - based on age or sex

## 2021-10-21 ENCOUNTER — Inpatient Hospital Stay: Payer: MEDICARE | Primary: Internal Medicine

## 2021-10-21 ENCOUNTER — Inpatient Hospital Stay: Admit: 2021-10-21 | Payer: MEDICARE | Attending: Orthopaedic Surgery | Primary: Internal Medicine

## 2021-10-21 DIAGNOSIS — M1612 Unilateral primary osteoarthritis, left hip: Secondary | ICD-10-CM

## 2021-10-21 MED ORDER — IOPAMIDOL 41 % INTRATHECAL
20041 mg iodine /mL (41 %) | Freq: Once | INTRATHECAL | Status: AC
Start: 2021-10-21 — End: 2021-10-21
  Administered 2021-10-21: 18:00:00 via INTRA_ARTICULAR

## 2021-10-21 MED ORDER — TRIAMCINOLONE ACETONIDE 40 MG/ML SUSP FOR INJECTION
40 mg/mL | Freq: Once | INTRAMUSCULAR | Status: AC
Start: 2021-10-21 — End: 2021-10-21
  Administered 2021-10-21: 18:00:00 via INTRA_ARTICULAR

## 2021-10-21 MED ORDER — LIDOCAINE (PF) 10 MG/ML (1 %) IJ SOLN
10 mg/mL (1 %) | Freq: Once | INTRAMUSCULAR | Status: AC
Start: 2021-10-21 — End: 2021-10-21
  Administered 2021-10-21: 18:00:00 via SUBCUTANEOUS

## 2021-10-21 MED FILL — KENALOG 40 MG/ML SUSPENSION FOR INJECTION: 40 mg/mL | INTRAMUSCULAR | Qty: 1

## 2021-10-21 MED FILL — ISOVUE-M 200  41 % INTRATHECAL SOLUTION: 200 mg iodine /mL (41 %) | INTRATHECAL | Qty: 10

## 2021-10-21 NOTE — Progress Notes (Signed)
PT DAILY TREATMENT NOTE 06-21    Patient Name: Elaine [name]  Date:10/21/2021  DOB: 13-Feb-1951  [x]   Patient DOB Verified  Payor: HUMANA MEDICARE / Plan: [place] MEDICARE HMO / Product Type: Managed Care Medicare /    In time: 3:32  Out time: 3:36  Total Treatment Time (min): -  Visit #: - of 8-12      Patient not seen today d/t having cortisone injection in her hip this AM, will see for Thursday appt.    Gerlean Ren, LPTA

## 2021-10-23 ENCOUNTER — Inpatient Hospital Stay: Payer: MEDICARE | Primary: Internal Medicine

## 2021-10-23 MED ORDER — TRELEGY ELLIPTA 200 MCG-62.5 MCG-25 MCG POWDER FOR INHALATION
Freq: Every day | RESPIRATORY_TRACT | 0 refills | Status: AC
Start: 2021-10-23 — End: ?

## 2021-10-23 NOTE — Telephone Encounter (Signed)
Pt stated that she would like to have some samples of trelegy. Please call [phone] for further information.

## 2021-10-27 ENCOUNTER — Encounter

## 2021-10-27 MED ORDER — ATORVASTATIN 40 MG TAB
40 mg | ORAL_TABLET | ORAL | 1 refills | Status: AC
Start: 2021-10-27 — End: ?

## 2021-10-28 ENCOUNTER — Inpatient Hospital Stay: Admit: 2021-10-28 | Payer: MEDICARE | Primary: Internal Medicine

## 2021-10-28 NOTE — Progress Notes (Signed)
 PT DAILY TREATMENT NOTE 06-21    Patient Name: Elaine [name]  Date:10/28/2021  DOB: 1951/06/11  [x]   Patient DOB Verified  Payor: [employer] MEDICARE / Plan: [employer]IC[employer] HMO / Product Type: Managed Care Medicare /    In time: 3:48  Out time: 4:35  Total Treatment Time (min): 47  Visit #: 2 of 8-12    Medicare/BCBS Only   Total Timed Codes (min): 47 1:1 Treatment Time: 47       Treatment Area: Other low back pain [M54.59]    SUBJECTIVE  Pain Level (0-10 scale): 6-7/10  Any medication changes, allergies to medications, adverse drug reactions, diagnosis change, or new procedure performed?: [x]  No    []  Yes (see summary sheet for update)  Subjective functional status/changes:   []  No changes reported  After the injection at the left hip she reports dull, constant pain in her right low back. She took tylenol  before therapy today. She says that yesterday her left knee was buckling but not today. Pt reports she wants to be able to stand upright not bent over like an old lady.     OBJECTIVE    32 min Therapeutic Exercise:  [x]  See flow sheet :   Rationale: increase ROM and increase strength to improve the patient's ability to increase ease with ADLs     15 min  Manual Therapy:  [x]   See flow sheet : leg lengthening for left upslip; left PI MET; right AI: shotgun technique; MET for L4-L5 left rotation; TPR to right superior glute maximus   Rationale: increase strength, improve coordination, improve balance, and increase proprioception  to improve the patient's ability to increase ease with ADLs          With   [x]  TE   []  TA   []  neuro   []  other: Patient Education: [x]  Review HEP    []  Progressed/Changed HEP based on:   []  positioning   []  body mechanics   []  transfers   []  heat/ice application    []  other:      Other Objective/Functional Measures:   TTP right SI  Left upslip; right AI/left PI  Significant right hip flexor tightness noted with sidelying hip flexor stretch.  Significant anterior pelvic tilt noted  with standing   TTP to right superior glute max  Significant improvement noted after manual     Pain Level (0-10 scale) post treatment: 0/10    ASSESSMENT/Changes in Function:    Pt continues to demonstrate an increased anterior pelvic tilt with ambulation and standing causing increased compression on the lumbar spine. Skilled care is indicated to stretch B hip flexors and to strengthen core muscles to facilitate a pain free, normal gait pattern with upright posture for ease of ADLs.     Patient will continue to benefit from skilled PT services to modify and progress therapeutic interventions, address functional mobility deficits, address ROM deficits, address strength deficits, analyze and cue movement patterns, analyze and modify body mechanics/ergonomics, assess and modify postural abnormalities, address imbalance/dizziness, and instruct in home and community integration to attain remaining goals.     []   See Plan of Care  [x]   See progress note/recertification  []   See Discharge Summary         Goals for this certification period to be accomplished in 4 weeks:  Patient will demonstrate compliance with updated HEP in order to improve LE strength for increased ease of ambulation.   Status at evaluation/last progress note:  new goal   2. Goal: Patient will improve FOTO score by 21 points in order to demonstrate a significant improvement in function.   Status at evaluation/last progress note: 38 points    3.   Goal: Patient will improve B hip extension MMT to 4-/5 in order to increase ease of ambulation.   Status at evaluation/last progress note: B: 3+/5    4.   Goal: Patient will report a 80% improvement in symptoms since [facility] in order to improve quality of life.   Status at evaluation/last progress note: 70%   5. Goal: Patient will negotiate stairs with step to pattern with BHRs in order to increase safety and improve quality of life.   Status at evaluation/last progress note: new goal    PLAN  []   Upgrade  activities as tolerated     [x]   Continue plan of care  []   Update interventions per flow sheet       []   Discharge due to:_  [x]   Other: teach patient home hip flexor stretch    Tinnie Crouch, SPTA 10/28/2021  4:28 PM    I was present during the entire treatment, directing and participating in the treatment.   Leita Castor, LPTA     Future Appointments   Date Time Provider Department Center   10/28/2021  3:45 PM Castor Leita CHRISTELLA JOSETTA [facility]   11/04/2021  9:30 AM Victorine Pan, MD [facility]   11/04/2021  3:45 PM Randell Almarie LABOR, PTA [facility]   11/06/2021  3:45 PM Lynnea Senior, PT [facility]   11/11/2021  1:00 PM Westcott, Morene ORN, DO [facility] AMB   11/11/2021  4:00 PM Jama Friend, PT [facility]   11/17/2021  3:30 PM [facility] LAB VISIT [facility] AM[facility]   11/24/2021  3:40 PM Waldon Marcus CROME, MD [facility] AMB   06/19/2022  9:00 AM Charlanne Nolan POUR, MD [facility] AMB

## 2021-10-29 NOTE — Telephone Encounter (Signed)
Patient called in requesting that work note be extended til 11/12/2021 as she is not set to see Dr. Garner Nash again until 11/11/2021. Patient can be reached at [phone] when note is ready.

## 2021-10-31 ENCOUNTER — Encounter

## 2021-10-31 NOTE — Telephone Encounter (Signed)
Call and inform patient work note is ready for pick-up. Patient verberalized understanding.

## 2021-11-03 MED ORDER — POTASSIUM CHLORIDE SR 10 MEQ TAB, PARTICLES/CRYSTALS
10 mEq | ORAL_TABLET | ORAL | 3 refills | Status: AC
Start: 2021-11-03 — End: ?

## 2021-11-04 ENCOUNTER — Inpatient Hospital Stay: Admit: 2021-11-04 | Payer: MEDICARE | Primary: Internal Medicine

## 2021-11-04 ENCOUNTER — Ambulatory Visit
Admit: 2021-11-04 | Discharge: 2021-11-04 | Payer: MEDICARE | Attending: Physical Medicine & Rehabilitation | Primary: Internal Medicine

## 2021-11-04 ENCOUNTER — Ambulatory Visit: Attending: Physical Medicine & Rehabilitation | Primary: Internal Medicine

## 2021-11-04 DIAGNOSIS — M47816 Spondylosis without myelopathy or radiculopathy, lumbar region: Secondary | ICD-10-CM

## 2021-11-04 NOTE — Progress Notes (Signed)
 PT DAILY TREATMENT NOTE 06-21    Patient Name: Elaine [name]  Date:11/04/2021  DOB: May 13, 1951  [x]   Patient DOB Verified  Payor: [employer] MEDICARE / Plan: [employer] MEDIC[employer] HMO / Product Type: Managed Care Medicare /    In time: 3:02  Out time: 3:45  Total Treatment Time (min): 43  Visit #: 3 of 8-12    Medicare/BCBS Only   Total Timed Codes (min): 43 1:1 Treatment Time: 43       Treatment Area: Other low back pain [M54.59]    SUBJECTIVE  Pain Level (0-10 scale): 3/10  Any medication changes, allergies to medications, adverse drug reactions, diagnosis change, or new procedure performed?: [x]  No    []  Yes (see summary sheet for update)  Subjective functional status/changes:   []  No changes reported  Pt reports she has felt much better since the injection. She was hoping for a miracle with no pain but it definitely is mch better than it was. 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 She thinks it is weird that she has pain in her hip more than her back.     OBJECTIVE    25 min Therapeutic Exercise:  [x]  See flow sheet :   Rationale: increase ROM and increase strength to improve the patient's ability to increase ease with ADLs     18 min Therapeutic Activity:  [x]  See flow sheet : step ups, hip x 3, bridges, mini lunges   Rationale: increase ROM and increase strength to improve the patient's ability to increase ease with ADLs          With   [x]  TE   []  TA   []  neuro   []  other: Patient Education: [x]  Review HEP    []  Progressed/Changed HEP based on:   []  positioning   []  body mechanics   []  transfers   []  heat/ice application    []  other:      Other Objective/Functional Measures:   - minimal sacral clearance with bridges  - c/o fatigue and shoulder pain with standing therex today    Pain Level (0-10 scale) post treatment: 5/10    ASSESSMENT/Changes in Function:    Pt presents with c/o increased fatigue and soreness today which she  attributes to being poked at the doctor's office earlier. She put forth good effort and required additional time to perform each exercise. She demonstrate decreased core strength and anterior pelvic tilt as previously noted, and was challenged with PPT and bridges with minimal sacral clearance. Patient educated on home hip flexor S and provided with handout.     Patient will continue to benefit from skilled PT services to modify and progress therapeutic interventions, address functional mobility deficits, address ROM deficits, address strength deficits, analyze and cue movement patterns, analyze and modify body mechanics/ergonomics, assess and modify postural abnormalities, address imbalance/dizziness, and instruct in home and community integration to attain remaining goals.     []   See Plan of Care  [x]   See progress note/recertification  []   See Discharge Summary         Goals for this certification period to be accomplished in 4 weeks:  Patient will demonstrate compliance with updated HEP in order to improve LE strength for increased ease of ambulation.   Status at evaluation/last progress note: new goal     2. Goal: Patient will improve FOTO score  by 21 points in order to demonstrate a significant improvement in function.   Status at evaluation/last progress note: 38 points      3.   Goal: Patient will improve B hip extension MMT to 4-/5 in order to increase ease of ambulation.   Status at evaluation/last progress note: B: 3+/5      4.   Goal: Patient will report a 80% improvement in symptoms since [facility] in order to improve quality of life.   Status at evaluation/last progress note: 70%     5. Goal: Patient will negotiate stairs with step to pattern with BHRs in order to increase safety and improve quality of life.   Status at evaluation/last progress note: new goal  Current: 4 inch step with 1UE (11/04/21)    PLAN  []   Upgrade activities as tolerated     [x]   Continue plan of care  []   Update interventions per  flow sheet       []   Discharge due to:_  [x]   Other: teach patient home hip flexor stretch    Almarie DELENA Requena, PTA 11/04/2021  3:45 PM    Future Appointments   Date Time Provider Department Center   11/04/2021  3:00 PM Requena Almarie DELENA JOSETTA [facility]   11/06/2021  3:45 PM Dani Leita HERO, PTA [facility]   11/11/2021  1:00 PM Westcott, Morene ORN, DO [facility] AM[facility]   11/11/2021  4:00 PM Jama Friend, PT [facility]   11/17/2021  3:30 PM [facility] LAB VISIT [facility] AMB   11/24/2021  3:40 PM Waldon Marcus CROME, MD [facility] AM[facility]   12/31/2021  3:30 PM Victorine Pan, MD [facility] AMB   06/19/2022  9:00 AM Charlanne Nolan POUR, MD [facility] BS AMB

## 2021-11-04 NOTE — Progress Notes (Signed)
[name] presents today for   Chief Complaint   Patient presents with    Back Pain     lower    Leg Pain     left       Is someone accompanying this pt? no    Is the patient using any DME equipment during OV? Yes, cane    Depression Screening:  3 most recent PHQ Screens 10/20/2021   Little interest or pleasure in doing things Not at all   Feeling down, depressed, irritable, or hopeless Not at all   Total Score PHQ 2 0   Trouble falling or staying asleep, or sleeping too much -   Feeling tired or having little energy -   Poor appetite, weight loss, or overeating -   Feeling bad about yourself - or that you are a failure or have let yourself or your family down -   Trouble concentrating on things such as school, work, reading, or watching TV -   Moving or speaking so slowly that other people could have noticed; or the opposite being so fidgety that others notice -   Thoughts of being better off dead, or hurting yourself in some way -   PHQ 9 Score -       Learning Assessment:  Learning Assessment 03/07/2018   PRIMARY LEARNER Patient   HIGHEST LEVEL OF EDUCATION - PRIMARY LEARNER  -   BARRIERS PRIMARY LEARNER -   CO-LEARNER CAREGIVER -   PRIMARY LANGUAGE ENGLISH   LEARNER PREFERENCE PRIMARY READING   ANSWERED BY patient   RELATIONSHIP SELF       Abuse Screening:  Abuse Screening Questionnaire 10/20/2021   Do you ever feel afraid of your partner? N   Are you in a relationship with someone who physically or mentally threatens you? N   Is it safe for you to go home? Y       Fall Risk  Fall Risk Assessment, last 12 mths 10/20/2021   Able to walk? Yes   Fall in past 12 months? 0   Do you feel unsteady? 0   Are you worried about falling 0   Is TUG test greater than 12 seconds? -   Is the gait abnormal? -   Number of falls in past 12 months -         Coordination of Care:  1. Have you been to the ER, urgent care clinic since your last visit? no  Hospitalized since your last visit? no    2. Have you seen or consulted  any other health care providers outside of the [place] since your last visit? Yes, ortho, pcp, cardiology and pulmonology Include any pap smears or colon screening. no

## 2021-11-04 NOTE — Progress Notes (Signed)
Progress Notes by Charm Barges, MD at 11/04/21 0930                Author: Charm Barges, MD  Service: --  Author Type: Physician       Filed: 11/04/21 1018  Encounter Date: 11/04/2021  Status: Addendum          Editor: Charm Barges, MD (Physician)          Related Notes: Original Note by Charm Barges, MD (Physician) filed at 11/04/21  [facility]  [address], [place] [address]   Phone: [phone]   Fax: [phone]         Patient: Elaine [name]                                                                               MRN: [id number]         Date of Birth: 07-16-1951           AGE: 70 y.o.               PCP: Thana Farr, MD   Date:  11/04/21      Reason for Consultation: Back Pain (lower) and Leg Pain (left)         HPI:   Elaine [name] is a 70 y.o. female with relevant PMH of  C3-5 fusion in 2010, HTN, HLD  who presented with chronic low back pain which has been presents with over 30 years of intermittent which has  become constant over the past few months.  She was previously seen by pain management many years ago and tried epidural injections which helped for a period of time but then she decided to stop getting them. Her last MRI 2011 demonstrated a left L3/4  disc protrusion with moderate left foraminal stenosis, L5/S1 severe bilateral foraminal stenosis.  She started a course of PT  for her low back         More recently she saw Dr. Garner Nash with left hip pain 10/07/2021.  She states she woke one morning in October and could not put weight on her left hip.  She saw her PCP who gave her a prednisone pack which helped.  She had x-rays of her left hip with mild  hip OA.  She saw Dr. Garner Nash and tried fluoro guided hip injections 10/21/21 which she states has helped reduce her pain      Works at [employer], has taken off since October when hip pain started          Neurologic symptoms: No numbness, tingling, weakness, bowel or bladder changes.  No recent falls        Location: The pain is located in the low back pain , left groin   Radiation: The pain does not radiate.     Pain Score: Currently: 6/10    Quality: Pain is of a Stabbing quality.     Aggravating: Pain is exacerbated by walking and standing   Alleviating: The pain is  alleviated by lying down, sitting      Prior Treatments:   Physical therapy:   Currently in [facility]- [facility] [employer]   Injections:YES-   ESI-  helped a bit but stopped several years ago    Left hip injection fluoro guided reduced pain 9/10- 6/10    Chiropractic treatments- years ago which helped    Previous Medications:    Current Medications: tylenol , gabapentin 300mg  in am, 600mg  at night    Previous work-up has included:    Bone density 2016- wnl   X-ray left hip and pelvis-09/2021   No fracture or dislocation. Mild degenerative changes of the bilateral hips and   sacroiliac joints.       Sclerotic focus at the right femoral neck appears similar compared to prior       MRI cervical spine 2016   No enhancing abnormalities.   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 No associated hydrocephalus, or cord syrinx.       Postoperative changes after anterior plating and fusion, C3-C5.    Degenerative spondylosis, C5/C6 and C6/C7, without significant alterations, all other is an element of mild central canal stenosis on a multifactorial basis, C5-C6.       Central canal contents, and paravertebral soft tissues otherwise unremarkable.          MRI lumbar spine 2011   L1-L2: Unremarkable.   L2-L3: Also unremarkable.   L3-L4: Posterior disc bulge and left postero lateral corner   protrusion. AP central canal is more than 10 mm.  No deformity of the  thecal sac. Mild facet and ligamentous hypertrophy. Mild right  foraminal stenosis with mild indentation of exiting nerve root from  posterior disc bulge. On the left, moderate left foraminal  stenosis   with more compression of  the left exiting L3 nerve root from disc  protrusion. Slightly more severe left facet hypertrophy and  ligamentous hypertrophy as well.  Overall, grossly stable since last  MRI   L4-L5: Posterior disc bulge with mild central stenosis. AP central   canal measures 9 mm. More severe facet arthropathy with moderate  right facet joint effusion which is new. No significant right  foraminal stenosis. Mild to moderate left foraminal stenosis with  mild impingement of the left exiting L4 nerve root from  posterior   disc bulge. Facet joint effusion also present on the left.  Slightly  more severe than before.   L5-S1: Left posterolateral disc protrusion, indenting thecal sac, likely S1 root on the left. Facet hypertrophy also present. Severe   bilateral foraminal stenosis with impingement of bilateral L5   exiting nerve root.  Slightly more severe than before   IMPRESSION: Stable to mild progression of left-sided disease in the  lower  lumbar levels as above.     Past Medical History:      Past Medical History:        Diagnosis  Date         ?  Anemia       ?  Asthma       ?  Colon polyp       ?  DDD (degenerative disc disease)  02/15/2009     ?  Depression  12/18/2011     ?  Essential hypertension, benign           ?  History of echocardiogram  03/20/2004          EF 70%.  Borderline DDfx.  No significant valvular  pathology.         ?  Hypercholesterolemia       ?  Hypertension       ?  Iron deficiency anemia  02/15/2009     ?  Lichen planus  04/23/6159     ?  Lower extremity venous duplex  10/23/2009          Left leg:  No DVT.           ?  Meralgia paraesthetica  10/22/2013     ?  OA (osteoarthritis of spine)  02/15/2009     ?  OA (osteoarthritis of spine)  02/15/2009     ?  Obesity, unspecified       ?  Other and unspecified hyperlipidemia       ?  Pre-operative cardiovascular examination            For spine surgery         ?  Right sided sciatica       ?  Shortness of breath             Possible asthma, HCVD; less likely CAD (Noted 03/15/09)         ?  Sleep apnea  02/15/2009          uses cpap machine         ?  Thallium stress test   03/20/2004          Partially transient, mod basal & mid anterior defect most c/w artifact; mild anterior ischemia less likely.  Neg EKG on pharm stress test.         ?  Thyroid disease            hypothyroidism         Past Surgical History:      Past Surgical History:         Procedure  Laterality  Date          ?  COLONOSCOPY  N/A  04/25/2020          SCREENING COLONOSCOPY performed by Sloan Leiter, MD at [facility] ENDOSCOPY          ?  HX COLONOSCOPY    02-24-12          normal (Dr. Angelique Holm)          ?  HX HERNIA REPAIR              3x          ?  HX HYSTERECTOMY         ?  HX TUBAL LIGATION         ?  NEUROLOGICAL PROCEDURE UNLISTED    03-16-2009          s/p ACD & Fusion (Dr.P.Gurtner)          ?  PR COLONOSCOPY FLX DX W/COLLJ SPEC WHEN PFRMD    02-2007          +polyp(tubular adenoma); Dr Angelique Holm         SocHx:      Social History          Tobacco Use         ?  Smoking status:  Former              Packs/day:  1.00         Years:  28.00         Pack years:  28.00  Types:  Cigarettes         Start date:  10/16/1977         Quit date:  04/25/2004         Years since quitting:  17.5         ?  Smokeless tobacco:  Never       Substance Use Topics         ?  Alcohol use:  No         FamHx:?      Family History         Problem  Relation  Age of Onset          ?  Cancer  Mother       ?  Hypertension  Mother       ?  Hypertension  Sister                x3          ?  Heart Surgery  Sister       ?  Heart Disease  Sister       ?  Heart Attack  Sister       ?  Cancer  Maternal Aunt                breast          ?  Breast Cancer  Maternal Aunt       ?  Glaucoma  Son       ?  Heart defect  Son       ?  Diabetes  Paternal Uncle            ?  Hypertension  Paternal Uncle            Current Medications:       Current Outpatient Medications          Medication  Sig  Dispense   Refill           ?  pantoprazole (PROTONIX) 40 mg tablet  Take 1 Tablet by mouth daily.         ?  potassium chloride (KLOR-CON M10) 10 mEq tablet  TAKE 1 TABLET EVERY DAY  90 Tablet  3     ?  atorvastatin (LIPITOR) 40 mg tablet  TAKE  1  TABLET  EVERY  DAY (NEED MD APPOINTMENT)  90 Tablet  1     ?  fluticasone-umeclidin-vilanter (Trelegy Ellipta) 200-62.5-25 mcg inhaler  Take 1 Puff by inhalation daily.  2 Each  0     ?  fluticasone propionate (FLONASE) 50 mcg/actuation nasal spray  2 Sprays by Both Nostrils route daily as needed.         ?  iron dextran (Infed) 50 mg/mL injection  975 mg by IntraVENous route. Every 2 months         ?  levothyroxine (SYNTHROID) 150 mcg tablet  TAKE 1 TABLET EVERY DAY BEFORE BREAKFAST (NEED MD APPOINTMENT)  90 Tablet  0     ?  gabapentin (NEURONTIN) 300 mg capsule  TAKE 1 CAPSULE EVERY MORNING AND TAKE 2 CAPSULES EVERY NIGHT  270 Capsule  2     ?  valsartan-hydroCHLOROthiazide (DIOVAN-HCT) 160-25 mg per tablet  TAKE 1 TABLET EVERY DAY  90 Tablet  1     ?  lidocaine (LIDODERM) 5 %  1 Patch by TransDERmal route every twenty-four (24) hours. 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  montelukast (Singulair) 10 mg tablet  Take 1 Tablet by mouth daily.  90 Tablet  3     ?  ZINC PO  Take 1 Tablet by mouth daily.         ?  coffee xt/phosphatidyl serine (NEURIVA ORIGINAL PO)  Take  by mouth daily. 1 gummie         ?  albuterol (PROVENTIL HFA, VENTOLIN HFA, PROAIR HFA) 90 mcg/actuation inhaler  TAKE 2 PUFFS BY INHALATION EVERY FOUR (4) HOURS AS NEEDED FOR WHEEZING.  1 Inhaler  5     ?  vit B complx C/folic acid/zinc (VIT B COMPLEX-C-FOLIC AC-ZINC PO)  Take 1 Tab by mouth daily.         ?  latanoprost (XALATAN) 0.005 % ophthalmic solution  Administer 1 Drop to both eyes nightly.         ?  multivitamin (ONE A DAY) tablet  Take 1 Tab by mouth daily.         ?  cholecalciferol (VITAMIN D3) (5000 Units/125 mcg) tab tablet  Take 5,000 Units by mouth daily.         ?  famotidine, PF,  (PEPCID) 20 mg/2 mL soln injection  20 mg by IntraVENous route.         ?  terbinafine HCL (LAMISIL) 250 mg tablet  TAKE 1 TABLET EVERY DAY  90 Tablet  0     ?  raNITIdine hcl 150 mg capsule  Take 150 mg by mouth nightly.               ?  omeprazole (PRILOSEC) 40 mg capsule  Take 40 mg by mouth daily.             Allergies:       Allergies        Allergen  Reactions         ?  Aspirin  Other (comments)             GI distress,          ?  Asa-Acetaminophen-Caff-Potass  Other (comments)             Bleeding in stomach         ?  Benadryl [Diphenhydramine Hcl]  Other (comments)             Muscle jerking         ?  Other Plant, Higher education careers adviser, Environmental  Not Reported This Time             Grass, Dust and Mold         ?  Pollen Extracts  Not Reported This Time            Review of Systems:    Gen:    Denied fevers, chills, malaise, fatigue, weight changes    Resp: Denied shortness of breath, cough, wheezing    CVS: Denied chest pain, palpitations    GU: Denied urinary urgency, frequency, incontinence    GI: Denied nausea, vomiting, constipation, diarrhea    Skin: Denied rashes, wounds    Psych: Denied anxiety, depression    Vasc: Denied claudication, ulcers    Hem: Denied easy bruising/bleeding    MSK: See HPI    Neuro: See HPI            Physical Exam       Vital Signs: Visit Vitals      BP  130/78 (BP 1 Location: Left upper arm, BP Patient  Position: Sitting, BP Cuff Size: Large adult)     Pulse  86     Temp  98 ??F (36.7 ??C) (Temporal)     Ht  5\' 2"  (1.575 m)     Wt  191 lb (86.6 kg)         SpO2  95%  Comment: RA        BMI  34.93 kg/m??         General: ??????? Well nourished and well developed female without any acute distress    Psychiatric: ?  Alert and oriented x 3 with normal mood     HEENT: ????????  Atraumatic    Respiratory:   Breathing non-labored and non dyspneic    CV: ???????????????? Peripheral pulses intact, no peripheral edema    Skin: ?????????????  No rashes          Neurologic: ??        Sensation:  normal and grossly intact thebilateral, lower extremity(s)    Strength: 5/5 in the bilateral, lower extremity(s) - 4/5 strength left hip flexion -no pain   Reflexes: reveals 2+ symmetric DTRs throughout     Gait: normal       Musculoskeletal: Lumbar Exam       Inspection:    Alignment: Normal   Atrophy: None          Tenderness to Palpation:    Lumbar paraspinals Positive   Lumbar spinous processes Negative   SI Joint:  Negative   Gluteal:Negative   Greater trochanter: Positive b/l         ROM:    Lumbar ROM: Normal   Lumbar facet loading: Positive   Hip ROM: No reproduction of pain with movement seated      Special Tests        Slump test: Negative    SLR: Negative   FABER: Negative   FADIR: Negative   Log Roll: Negative         Medical Decision Making:     Images: The imaging results    Labs:  The results below were reviewed.       X-ray left hip reviewed as above- mild hip OA         Assessment:    -lumbar spondylosis/- lumbar facet arthritis   - left hip pain- vs left lumbar radiculitis?- on exam no pain with hip IR today, did have relief with IA hip injection, does have weakness left hip flexion            Plan:        -Physical therapy -  Continue PT    -Medications - lidocaine patch. Counseled regarding side effects and appropriate administration of medications.     -Diagnostics/Imaging - consider repeat imaging lumbar spine -MRI    -Injections - NA . Can repeat left hip IA injection if needed   -Lifestyle - Encouraged regular aerobic exercise    -Education - The patient's diagnosis, prognosis and treatment options were discussed today. All questions were answered.    F/U - in 8 week(s) or sooner if needed.  Consider imaging lumbar spine             Anna-Christina Latecia Miler MD   [facility]           Total time spent with patient:  25 mins for today's visit was devoted to face-to-face counseling regarding the following:  Discussed diagnosis, treatment options, and risks and  benefits of treatment              ?

## 2021-11-06 ENCOUNTER — Inpatient Hospital Stay: Admit: 2021-11-06 | Payer: MEDICARE | Primary: Internal Medicine

## 2021-11-06 DIAGNOSIS — M5459 Other low back pain: Secondary | ICD-10-CM

## 2021-11-06 NOTE — Discharge Instructions (Signed)
Therapy Discharge by Joline Salt, PT at 11/06/21 1545                Author: Joline Salt, PT  Service: Physical Therapy  Author Type: Physical Therapist       Filed: 12/26/21 1610  Date of Service: 11/06/21 1545  Status: Signed           Editor: Joline Salt, PT (Physical Therapist)  Cosigner: Charm Barges, MD at 12/29/21 2224               [facility] - [place] [address]   [phone]  [phone] fax      Physical Therapy Discharge Summary             Patient name: Elaine [name]  Start of Care: 09/11/2021        Referral source: [facility]*  DOB: 09-25-1951     Medical/Treatment Diagnosis: Other low back pain [M54.59]   Payor: [employer] MEDICARE / Plan: [place] HMO / Product Type: Managed Care Medicare /   Onset Date:15 years ago with worsening symptoms over the past year            Prior Hospitalization: see medical history  Provider#: [id number]        Medications: Verified on Patient Summary List        Comorbidities: chiari malformation, arthritis, HTN, asthma   Prior Level of Function:ambulation with SPC, Ind with ADLs, working as a Oncologist      Visits from Start of Care: 12    Missed Visits: 5      Reporting Period : 11/06/21 to 11/06/21      Summary of Care:   Goal: Patient will improve FOTO score by 21 points in order to demonstrate a significant improvement in function.    Status at evaluation/last progress note: 42 points    Status at discharge: not met      Goal:Patient will improve B hip extension MMT to 4-/5 in order to increase ease of ambulation.    Status at evaluation/last progress note: B: 3+/5    Status at discharge: not met      Goal:Patient will report a 80% improvement in symptoms since [facility] in order to improve quality of life.    Status at evaluation/last progress note: 70%   Status at discharge: not met      Goal:Patient will negotiate stairs with  step to pattern with B HRs in order to increase safety and improve quality of life.    Status at evaluation/last progress note: crawls or scoots on the stairs   Status at discharge: not met      Pt. Did not return to PT after last progress note, so goals were unable to be re-assessed. Per last progress note "Ms. Couzens continues to make slow, steady progress towards goals in therapy.  Her FOTO score improved 3 points and her % improvement since start of care remained 70% since last assessment. She has had decreased episodes of left leg buckling but has noticed a slight increase in LBP. She continues to have most imbalance in the morning  and reports falling when trying to walk into her closet. She continues to negotiate stairs by crawling or scooting due to lack of a second handrail but is planning to move in with her daughter. Her hip extension remains decreased at 3+/5. She has increased  anterior pelvic tilt in  standing with increased hip flexor tightness further contributing to hinging in the l/s and pain with achieving upright posture."      ASSESSMENT/RECOMMENDATIONS:   '[x]' Discontinue therapy: '[]' Patient has reached or is progressing toward set goals       '[x]' Patient is non-compliant or  has abdicated       '[]'  Due to lack of appreciable progress towards set goals      Joline Salt, PT 12/26/2021 7:38 AM

## 2021-11-06 NOTE — Other (Addendum)
Therapy Recertification by Joline Salt, PT at 11/06/21 1545                Author: Joline Salt, PT  Service: Physical Therapy  Author Type: Physical Therapist       Filed: 11/07/21 0722  Date of Service: 11/06/21 1545  Status: Addendum          Editor: Joline Salt, PT (Physical Therapist)       Related Notes: Original Note by Denver Faster, PTA (Physical Therapy Assistant) filed at  11/06/21 1811          Cosigner: Charm Barges, MD at 11/10/21 1310               [facility] - [facility]   [address] [place] [address]   [phone]  [phone] fax      Continued Plan of Care/ Re-certification for Physical Therapy Services         Patient name: Elaine [name]  Start of Care: 09/11/2021        Referral source: [facility]*  DOB: [birth year]     Medical/Treatment Diagnosis: Other low back pain [M54.59]   Payor: HU[employer] MEDICARE / Plan: [place] HMO / Product Type: Managed Care Medicare /   Onset Date:15 years ago with worsening symptoms  over the past year           Prior Hospitalization: see medical history  Provider#: [id number]        Medications: Verified on Patient Summary List       Comorbidities: chiari malformation, arthritis, HTN, asthma   Prior Level of Function:ambulation with SPC, Ind with ADLs, working as a Careers adviser   Visits from [employer] of Care: 12   Missed Visits:  4      The Plan of Care and following information is based on the patient's current status:   Goal: Patient will demonstrate compliance with updated HEP in order to improve LE strength  for increased ease of ambulation   Status at last note/certification: new goal   Current Status: Progressing 2 x week      Goal: Patient will improve FOTO score by 21 points in order to demonstrate a significant improvement in function   Status at last note/certification:38 points   Current Status: Progressing 42/100 (3 point improvement)       Goal: Patient will improve B hip extension MMT to 4-/5 in order to increase ease of ambulation.    Status at last note/certification:B: 3+/5    Current Status: not met 3+/5      Goal: Patient will report a 80% improvement in symptoms since [facility] in order to improve quality of life   Status at last note/certification:70%    Current Status: not met 70%      Goal: Patient will negotiate stairs with step to pattern with BHRs in order to increase safety and improve quality of life.    Status at last note/certification: new goal   Current Status: not met (crawls or scoots due to not having 2 HRs and to decrease fall risk)      Key functional changes: decreased episodes of left leg buckling, decreased pain      Problems/ barriers to goal attainment: HEP compliance; chronicity of symptoms      Problem List: pain affecting function, decrease ROM, decrease strength, impaired gait/ balance, decrease ADL/ functional abilitiies, decrease activity  tolerance, decrease flexibility/ joint mobility, and decrease transfer  abilities      Treatment Plan: Therapeutic exercise, Neuromuscular reeducation, Manual therapy, Therapeutic activity, Self care/home management, and Gait training       Patient Goal (s) has been updated and includes: "to not walk bent over like an old lady and to not fall"      Goals for this certification period to be accomplished in 4 weeks:   1. Patient will improve FOTO score by 21 points in order to demonstrate a significant improvement in function.    Status at evaluation/last progress note: 42 points        2. Patient will improve B hip extension MMT to 4-/5 in order to increase ease of ambulation.    Status at evaluation/last progress note: B: 3+/5        3. Patient will report a 80% improvement in symptoms since [facility] in order to improve quality of life.    Status at evaluation/last progress note: 70%       4. Patient will negotiate stairs with step to pattern with B HRs in order to increase safety and  improve quality of life.    Status at evaluation/last progress note: crawls or scoots on the stairs      Frequency / Duration: Patient to be seen 2-3 times per week for 4 weeks:      Assessment / Recommendations: Ms. Barnwell continues to make slow, steady progress towards goals in therapy. Her FOTO score improved 3 points and  her % improvement since start of care remained 70% since last assessment. She has had decreased episodes of left leg buckling but has noticed a slight increase in LBP. She continues to have most imbalance in the morning and reports falling when trying  to walk into her closet. She continues to negotiate stairs by crawling or scooting due to lack of a second handrail but is planning to move in with her daughter. Her hip extension remains decreased at 3+/5. She has increased anterior pelvic tilt in standing  with increased hip flexor tightness further contributing to hinging in the l/s and pain with achieving upright posture. Skilled PT remains medically necessary to improve neutral hip and spine posture and progress core and hip strength to centralize radicular  symptoms and improve stability for ambulation with decreased fall risk.       Certification Period: 11/07/2021 to 12/06/2021      Denver Faster, PTA 11/06/2021 2:09 PM      ________________________________________________________________________   I certify that the above Therapy Services are being furnished while the patient is under my care. I agree with the treatment plan and certify that this therapy is necessary.      '[]'   I have read the above and request that my patient continue as recommended.   '[]'   I have read the above report and request that my patient continue therapy with the following changes/special instructions: _______________________________________   '[]'   I have read the above report and request that my patient be discharged from therapy      Physician's Signature:____________Date :_________TIME:________      [facility] Living*   ** Signature, Date and Time must be completed for valid certification **      Please sign and return to [facility] -  [facility]   [address] [place] [address]   [phone]  [phone] fax

## 2021-11-06 NOTE — Progress Notes (Signed)
 PT DAILY TREATMENT NOTE 06-21    Patient Name: Elaine [name]  Date:11/06/2021  DOB: 01-01-1951  [x]   Patient DOB Verified  Payor: [employer] MEDICARE / Plan: [employer] MEDICARE HMO / Product Type: Managed Care Medicare /    In time: 3:47  Out time: 4:40  Total Treatment Time (min):53  Visit #: 4 of 8-12    Medicare/BCBS Only   Total Timed Codes (min): 53 1:1 Treatment Time: 53       Treatment Area: Other low back pain [M54.59]    SUBJECTIVE  Pain Level (0-10 scale): 4/10  Any medication changes, allergies to medications, adverse drug reactions, diagnosis change, or new procedure performed?: [x]  No    []  Yes (see summary sheet for update)  Subjective functional status/changes:   []  No changes reported  Pt reports she wants to do one more round of therapy.Pt reports her low back pain is a constant dull ache. She also reports her left leg has been giving out randomly when walking but has gotten better since the last session. She says she needs a shopping cart when grocerey shopping to help her back. She says at home she crawls up the stairs and sometimes scoots down the steps. She said she doesn't have handrails on both sides of the stairs but she is trying to live with her daughter at the end of the month.       OBJECTIVE    21 min Therapeutic Exercise:  [x]  See flow sheet :   Rationale: increase ROM and increase strength to improve the patient's ability to increase ease with ADLs     32 min Therapeutic Activity:  [x]  See flow sheet : goal assessment    Rationale: increase ROM and increase strength to improve the patient's ability to increase ease with ADLs          With   []  TE   []  TA   []  neuro   []  other: Patient Education: [x]  Review HEP    []  Progressed/Changed HEP based on:   []  positioning   []  body mechanics   []  transfers   []  heat/ice application    []  other: provided handout of standing hip flexor stretch     Other Objective/Functional Measures:   FOTO:42  Right hip ext MMT: 3+/5  Left hip ext MMT: 3+/5  %  Improvement 70%  Stair negotiation crawling/scooting up stairs  Increased anterior pelvic tilt in standing with tight hip flexors    Pain Level (0-10 scale) post treatment: 2/10    ASSESSMENT/Changes in Function:  see progress note      Patient will continue to benefit from skilled PT services to modify and progress therapeutic interventions, address functional mobility deficits, address ROM deficits, address strength deficits, analyze and cue movement patterns, analyze and modify body mechanics/ergonomics, assess and modify postural abnormalities, address imbalance/dizziness, and instruct in home and community integration to attain remaining goals.     []   See Plan of Care  [x]   See progress note/recertification  []   See Discharge Summary         Goals for this certification period to be accomplished in 4 weeks:  Patient will demonstrate compliance with updated HEP in order to improve LE strength for increased ease of ambulation.   Status at evaluation/last progress note: new goal   (11/06/21): Pt reports she tries to do her HEP twice a week but has not been doing it this week     2. Goal: Patient  will improve FOTO score by 21 points in order to demonstrate a significant improvement in function.   Status at evaluation/last progress note: 38 points    (11/06/21):42    3.   Goal: Patient will improve B hip extension MMT to 4-/5 in order to increase ease of ambulation.   Status at evaluation/last progress note: B: 3+/5    (11/06/21): B 3+/5    4.   Goal: Patient will report a 80% improvement in symptoms since [facility] in order to improve quality of life.   Status at evaluation/last progress note: 70%   (11/06/21): same     5. Goal: Patient will negotiate stairs with step to pattern with BHRs in order to increase safety and improve quality of life.   Status at evaluation/last progress note: new goal  Current: 4 inch step with 1UE (11/04/21)  (11/06/21): She crawls up the stairs and scoots down the steps when going  down    PLAN  [x]   Upgrade activities as tolerated     [x]   Continue plan of care  []   Update interventions per flow sheet       []   Discharge due to:_  []   Other:     Tinnie Crouch, SPTA 11/06/2021  3:45 PM    I was present during the entire treatment, directing and participating in the treatment.   Leita Castor, LPTA   Future Appointments   Date Time Provider Department Center   11/06/2021  3:45 PM Castor Leita CHRISTELLA JOSETTA [facility]   11/11/2021  1:00 PM Westcott, Morene ORN, DO [facility] AMB   11/11/2021  4:00 PM Jama Friend, PT [facility]   11/17/2021  3:30 PM [facility] LAB VISIT [facility] AM[facility]   11/24/2021  3:40 PM Waldon Marcus CROME, MD [facility] AMB   12/31/2021  3:30 PM Victorine Pan, MD [facility] BS AMB   06/19/2022  9:00 AM Charlanne Nolan POUR, MD [facility] BS AMB

## 2021-11-10 NOTE — Progress Notes (Signed)
Patient dropped off FMLA paperwork at hs office and can be reached at [phone] when ready for pickup.

## 2021-11-11 ENCOUNTER — Ambulatory Visit
Admit: 2021-11-11 | Discharge: 2021-11-11 | Payer: MEDICARE | Attending: Orthopaedic Surgery | Primary: Internal Medicine

## 2021-11-11 ENCOUNTER — Inpatient Hospital Stay: Payer: MEDICARE | Primary: Internal Medicine

## 2021-11-11 ENCOUNTER — Ambulatory Visit: Attending: Orthopaedic Surgery | Primary: Internal Medicine

## 2021-11-11 DIAGNOSIS — M1612 Unilateral primary osteoarthritis, left hip: Secondary | ICD-10-CM

## 2021-11-11 NOTE — Progress Notes (Signed)
Progress Notes by Merdis Delay, DO at 11/11/21 1300                Author: Merdis Delay, DO  Service: --  Author Type: Physician       Filed: 11/11/21 1406  Encounter Date: 11/11/2021  Status: Signed          Editor: Merdis Delay, DO (Physician)                             Patient: Elaine [name]                MRN: [id number]        SSN: [id number]   Date of Birth: [birth year]        AGE:  70 y.o.        SEX: female   Body mass index is 36.32 kg/m??.      PCP: Thana Farr, MD   11/11/21   Occupation:   Teacher      Chief Complaint: Hip Pain (Left hip pain)                   ICD-10-CM  ICD-9-CM             1.  Primary osteoarthritis of left hip   M16.12  715.15  AMB POC X-RAY RADEX HIP UNI WITH PELVIS 2-3 VIEWS                  2.  Class 2 obesity with body mass index (BMI) of 35.0 to 35.9 in adult, unspecified obesity type, unspecified whether serious comorbidity present   E66.9  278.00  REFERRAL TO BARIATRIC SURGERY            Z68.35  V85.35                    HPI: Elaine [name] is a 70 y.o. female patient with Hip Pain (Left hip pain)   She received a fluoroscopic guided intra-articular left hip injection on October 21, 2021 little over 2 weeks ago.  She says her hip is significantly better than it was before that.  She still gets sharp pains that will cause her left leg to weaken.   Her back pain is gotten significantly worse since her hip pain went away she just saw Dr. Vinson Moselle for her back last week and got some medications.        Tobacco Use: Medium Risk        ?  Smoking Tobacco Use: Former     ?  Smokeless Tobacco Use: Never        ?  Passive Exposure: Not on file              PHYSICAL EXAMINATION:   Visit Vitals      Pulse  96     Resp  18     Ht  5\' 2"  (1.575 m)     Wt  198 lb 9.6 oz (90.1 kg)     SpO2  97%        BMI  36.32 kg/m??        Body mass index is 36.32 kg/m??.   GENERAL: Alert and oriented x3, in no acute distress.   HEENT: Normocephalic, atraumatic.      MSK: Left Hip Exam        Tenderness    The patient is experiencing tenderness in  the lateral.      Range of Motion    Flexion:  90    External rotation:  30    Internal rotation: 10       Muscle Strength    Abduction: 5/5    Adduction: 5/5    Flexion: 5/5       Other    Erythema: absent   Scars: absent   Sensation: normal   Pulse: present                IMAGING:   Imaging read by myself and interpreted as follows:   3 view x-ray of the left hip including AP pelvis and AP and crosstable lateral of the left hip demonstrate significant joint space loss acetabular joint of approximately 90%.  There is osteophytosis,  subchondral sclerosis and subchondral cyst formation.      ASSESSMENT & PLAN   Diagnosis: 70 y.o. female with left hip osteoarthritis.  She is gotten good relief from intra-articular corticosteroid injection.  We discussed the possibility of a total hip arthroplasty today if he  is not ready to go forward with this.  We discussed weight loss and she would like to be referred to the bariatric program for help.  We will make her next appointment as needed going forward and we will rediscuss intra-articular injection versus total  hip arthroplasty when she needs it.           Past Medical History:        Diagnosis  Date         ?  Anemia       ?  Asthma       ?  Colon polyp       ?  DDD (degenerative disc disease)  02/15/2009     ?  Depression  12/18/2011     ?  Essential hypertension, benign       ?  History of echocardiogram  03/20/2004          EF 70%.  Borderline DDfx.  No significant valvular pathology.         ?  Hypercholesterolemia       ?  Hypertension       ?  Iron deficiency anemia  02/15/2009     ?  Lichen planus  06/15/6268     ?  Lower extremity venous duplex  10/23/2009          Left leg:  No DVT.           ?  Meralgia paraesthetica  10/22/2013     ?  OA (osteoarthritis of spine)  02/15/2009     ?  OA (osteoarthritis of spine)  02/15/2009     ?  Obesity, unspecified       ?  Other and unspecified  hyperlipidemia       ?  Pre-operative cardiovascular examination            For spine surgery         ?  Right sided sciatica       ?  Shortness of breath            Possible asthma, HCVD; less likely CAD (Noted 03/15/09)         ?  Sleep apnea  02/15/2009          uses cpap machine         ?  Thallium stress test   03/20/2004  Partially transient, mod basal & mid anterior defect most c/w artifact; mild anterior ischemia less likely.  Neg EKG on pharm stress test.         ?  Thyroid disease            hypothyroidism             Family History         Problem  Relation  Age of Onset          ?  Cancer  Mother       ?  Hypertension  Mother       ?  Hypertension  Sister                x3          ?  Heart Surgery  Sister       ?  Heart Disease  Sister       ?  Heart Attack  Sister       ?  Cancer  Maternal Aunt                breast          ?  Breast Cancer  Maternal Aunt       ?  Glaucoma  Son       ?  Heart defect  Son       ?  Diabetes  Paternal Uncle            ?  Hypertension  Paternal Uncle               Current Outpatient Medications          Medication  Sig  Dispense  Refill           ?  pantoprazole (PROTONIX) 40 mg tablet  Take 1 Tablet by mouth daily.         ?  potassium chloride (KLOR-CON M10) 10 mEq tablet  TAKE 1 TABLET EVERY DAY  90 Tablet  3     ?  atorvastatin (LIPITOR) 40 mg tablet  TAKE  1  TABLET  EVERY  DAY (NEED MD APPOINTMENT)  90 Tablet  1     ?  fluticasone-umeclidin-vilanter (Trelegy Ellipta) 200-62.5-25 mcg inhaler  Take 1 Puff by inhalation daily.  2 Each  0     ?  fluticasone propionate (FLONASE) 50 mcg/actuation nasal spray  2 Sprays by Both Nostrils route daily as needed.         ?  iron dextran (Infed) 50 mg/mL injection  975 mg by IntraVENous route. Every 2 months         ?  levothyroxine (SYNTHROID) 150 mcg tablet  TAKE 1 TABLET EVERY DAY BEFORE BREAKFAST (NEED MD APPOINTMENT)  90 Tablet  0     ?  gabapentin (NEURONTIN) 300 mg capsule  TAKE 1 CAPSULE EVERY MORNING AND TAKE 2  CAPSULES EVERY NIGHT  270 Capsule  2     ?  valsartan-hydroCHLOROthiazide (DIOVAN-HCT) 160-25 mg per tablet  TAKE 1 TABLET EVERY DAY  90 Tablet  1     ?  lidocaine (LIDODERM) 5 %  1 Patch by TransDERmal route every twenty-four (24) hours. 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  montelukast (Singulair) 10 mg tablet  Take 1 Tablet by mouth daily.  90 Tablet  3     ?  ZINC PO  Take 1 Tablet by mouth daily.         ?  coffee xt/phosphatidyl serine (NEURIVA ORIGINAL PO)  Take  by mouth daily. 1 gummie         ?  albuterol (PROVENTIL HFA, VENTOLIN HFA, PROAIR HFA) 90 mcg/actuation inhaler  TAKE 2 PUFFS BY INHALATION EVERY FOUR (4) HOURS AS NEEDED FOR WHEEZING.  1 Inhaler  5     ?  vit B complx C/folic acid/zinc (VIT B COMPLEX-C-FOLIC AC-ZINC PO)  Take 1 Tab by mouth daily.         ?  latanoprost (XALATAN) 0.005 % ophthalmic solution  Administer 1 Drop to both eyes nightly.         ?  multivitamin (ONE A DAY) tablet  Take 1 Tab by mouth daily.               ?  cholecalciferol (VITAMIN D3) (5000 Units/125 mcg) tab tablet  Take 5,000 Units by mouth daily.                  Allergies        Allergen  Reactions         ?  Aspirin  Other (comments)             GI distress,          ?  Asa-Acetaminophen-Caff-Potass  Other (comments)             Bleeding in stomach         ?  Benadryl [Diphenhydramine Hcl]  Other (comments)             Muscle jerking         ?  Other Plant, Higher education careers adviser, Environmental  Not Reported This Time             Grass, Dust and Mold         ?  Pollen Extracts  Not Reported This Time             Past Surgical History:         Procedure  Laterality  Date          ?  COLONOSCOPY  N/A  04/25/2020          SCREENING COLONOSCOPY performed by Sloan Leiter, MD at [facility] ENDOSCOPY          ?  HX COLONOSCOPY    02-24-12          normal (Dr. Angelique Holm)          ?  HX HERNIA REPAIR              3x          ?  HX HYSTERECTOMY         ?  HX TUBAL LIGATION         ?  NEUROLOGICAL PROCEDURE UNLISTED    03-16-2009           s/p ACD & Fusion (Dr.P.Gurtner)          ?  PR COLONOSCOPY FLX DX W/COLLJ SPEC WHEN PFRMD    02-2007          +polyp(tubular adenoma); Dr Angelique Holm             Social History          Socioeconomic History         ?  Marital status:  DIVORCED              Spouse name:  Not on file         ?  Number of children:  Not on file     ?  Years of education:  Not on file     ?  Highest education level:  Not on file       Occupational History        ?  Not on file       Tobacco Use         ?  Smoking status:  Former              Packs/day:  1.00         Years:  28.00         Pack years:  28.00         Types:  Cigarettes         Start date:  10/16/1977         Quit date:  04/25/2004         Years since quitting:  17.5         ?  Smokeless tobacco:  Never       Substance and Sexual Activity         ?  Alcohol use:  No     ?  Drug use:  No     ?  Sexual activity:  Not on file        Other Topics  Concern        ?  Not on file       Social History Narrative        ?  Not on file          Social Determinants of Health          Financial Resource Strain: Not on file     Food Insecurity: Not on file     Transportation Needs: Not on file     Physical Activity: Not on file     Stress: Not on file     Social Connections: Not on file     Intimate Partner Violence: Not on file       Housing Stability: Not on file           REVIEW OF SYSTEMS:        No changes from previous review of systems unless noted.         Prescription medication management discussed with patient.       Electronically signed by: Merdis Delay, DO      Note: This note was completed using voice recognition software.  Any typographical/name errors or mistakes are unintentional.

## 2021-11-11 NOTE — Progress Notes (Signed)
Formatting of this note is different from the original.  Images from the original note were not included.      Patient: Elaine [name]                MRN: [id number]       SSN: [id number]  Date of Birth: January 05, 1951        AGE: 70 y.o.        SEX: female  Body mass index is 36.32 kg/m.    PCP: Marvia Pickles, MD  11/11/21  Occupation:   Teacher    Chief Complaint: Hip Pain (Left hip pain)      ICD-10-CM ICD-9-CM    1. Primary osteoarthritis of left hip  M16.12 715.15 AMB POC X-RAY RADEX HIP UNI WITH PELVIS 2-3 VIEWS     2. Class 2 obesity with body mass index (BMI) of 35.0 to 35.9 in adult, unspecified obesity type, unspecified whether serious comorbidity present  E66.9 278.00 REFERRAL TO BARIATRIC SURGERY    Z68.35 V85.35        HPI: Elaine [name] is a 70 y.o. female patient with Hip Pain (Left hip pain)  She received a fluoroscopic guided intra-articular left hip injection on October 21, 2021 little over 2 weeks ago.  She says her hip is significantly better than it was before that.  She still gets sharp pains that will cause her left leg to weaken.  Her back pain is gotten significantly worse since her hip pain went away she just saw Dr. Maryann Conners for her back last week and got some medications.    Tobacco Use: Medium Risk    Smoking Tobacco Use: Former    Smokeless Tobacco Use: Never    Passive Exposure: Not on file     PHYSICAL EXAMINATION:  Visit Vitals  Pulse 96   Resp 18   Ht 5\' 2"  (1.575 m)   Wt 198 lb 9.6 oz (90.1 kg)   SpO2 97%   BMI 36.32 kg/m     Body mass index is 36.32 kg/m.  GENERAL: Alert and oriented x3, in no acute distress.  HEENT: Normocephalic, atraumatic.    MSK: Left Hip Exam     Tenderness   The patient is experiencing tenderness in the lateral.    Range of Motion   Flexion:  90   External rotation:  30   Internal rotation: 10     Muscle Strength   Abduction: 5/5   Adduction: 5/5   Flexion: 5/5     Other   Erythema: absent  Scars: absent  Sensation: normal  Pulse:  present        IMAGING:  Imaging read by myself and interpreted as follows:  3 view x-ray of the left hip including AP pelvis and AP and crosstable lateral of the left hip demonstrate significant joint space loss acetabular joint of approximately 90%.  There is osteophytosis, subchondral sclerosis and subchondral cyst formation.    ASSESSMENT & PLAN  Diagnosis: 70 y.o. female with left hip osteoarthritis.  She is gotten good relief from intra-articular corticosteroid injection.  We discussed the possibility of a total hip arthroplasty today if he is not ready to go forward with this.  We discussed weight loss and she would like to be referred to the bariatric program for help.  We will make her next appointment as needed going forward and we will rediscuss intra-articular injection versus total hip arthroplasty when she needs it.    Past Medical History:  Diagnosis Date    Anemia     Asthma     Colon polyp     DDD (degenerative disc disease) 02/15/2009    Depression 12/18/2011    Essential hypertension, benign     History of echocardiogram 03/20/2004    EF 70%.  Borderline DDfx.  No significant valvular pathology.    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      Meralgia paraesthetica 10/22/2013    OA (osteoarthritis of spine) 02/15/2009    OA (osteoarthritis of spine) 02/15/2009    Obesity, unspecified     Other and unspecified hyperlipidemia     Pre-operative cardiovascular examination     For spine surgery    Right sided sciatica     Shortness of breath     Possible asthma, HCVD; less likely CAD (Noted 03/15/09)    Sleep apnea 02/15/2009    uses cpap machine    Thallium stress test  03/20/2004    Partially transient, mod basal & mid anterior defect most c/w artifact; mild anterior ischemia less likely.  Neg EKG on pharm stress test.    Thyroid disease     hypothyroidism     Family History    Problem Relation Age of Onset    Cancer Mother     Hypertension Mother     Hypertension Sister         x3    Heart Surgery Sister     Heart Disease Sister     Heart Attack Sister     Cancer Maternal Aunt         breast    Breast Cancer Maternal Aunt     Glaucoma Son     Heart defect Son     Diabetes Paternal Uncle     Hypertension Paternal Uncle      Current Outpatient Medications   Medication Sig Dispense Refill    pantoprazole (PROTONIX) 40 mg tablet Take 1 Tablet by mouth daily.      potassium chloride (KLOR-CON M10) 10 mEq tablet TAKE 1 TABLET EVERY DAY 90 Tablet 3    atorvastatin (LIPITOR) 40 mg tablet TAKE  1  TABLET  EVERY  DAY (NEED MD APPOINTMENT) 90 Tablet 1    fluticasone-umeclidin-vilanter (Trelegy Ellipta) 200-62.5-25 mcg inhaler Take 1 Puff by inhalation daily. 2 Each 0    fluticasone propionate (FLONASE) 50 mcg/actuation nasal spray 2 Sprays by Both Nostrils route daily as needed.      iron dextran (Infed) 50 mg/mL injection 975 mg by IntraVENous route. Every 2 months      levothyroxine (SYNTHROID) 150 mcg tablet TAKE 1 TABLET EVERY DAY BEFORE BREAKFAST (NEED MD APPOINTMENT) 90 Tablet 0    gabapentin (NEURONTIN) 300 mg capsule TAKE 1 CAPSULE EVERY MORNING AND TAKE 2 CAPSULES EVERY NIGHT 270 Capsule 2    valsartan-hydroCHLOROthiazide (DIOVAN-HCT) 160-25 mg per tablet TAKE 1 TABLET EVERY DAY 90 Tablet 1    lidocaine (LIDODERM) 5 % 1 Patch by TransDERmal route every twenty-four (24) hours. 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 90 Tablet 3    ZINC PO Take 1 Tablet by mouth daily.      coffee xt/phosphatidyl serine (NEURIVA ORIGINAL PO) Take  by mouth daily. 1 gummie  albuterol (PROVENTIL HFA, VENTOLIN HFA, PROAIR HFA) 90 mcg/actuation inhaler TAKE 2 PUFFS BY INHALATION EVERY FOUR (4) HOURS AS NEEDED FOR WHEEZING. 1 Inhaler 5    vit B complx C/folic acid/zinc (VIT B COMPLEX-C-FOLIC AC-ZINC PO) Take 1 Tab by mouth daily.       latanoprost (XALATAN) 0.005 % ophthalmic solution Administer 1 Drop to both eyes nightly.      multivitamin (ONE A DAY) tablet Take 1 Tab by mouth daily.      cholecalciferol (VITAMIN D3) (5000 Units/125 mcg) tab tablet Take 5,000 Units by mouth daily.         Allergies   Allergen Reactions    Aspirin Other (comments)     GI distress,     Asa-Acetaminophen-Caff-Potass Other (comments)     Bleeding in stomach    Benadryl [Diphenhydramine Hcl] Other (comments)     Muscle jerking    Other Plant, Animal, Environmental Not Reported This Time     Grass, Dust and Mold    Pollen Extracts Not Reported This Time     Past Surgical History:   Procedure Laterality Date    COLONOSCOPY N/A 04/25/2020    SCREENING COLONOSCOPY performed by Raynelle Jan, MD at [facility] ENDOSCOPY    HX COLONOSCOPY  02-24-12    normal (Dr. Adelene Idler)    HX HERNIA REPAIR      3x    HX HYSTERECTOMY      HX TUBAL LIGATION      NEUROLOGICAL PROCEDURE UNLISTED  03-16-2009    s/p ACD & Fusion (Dr.P.Gurtner)    PR COLONOSCOPY FLX DX W/COLLJ SPEC WHEN PFRMD  02-2007    +polyp(tubular adenoma); Dr Adelene Idler     Social History     Socioeconomic History    Marital status: DIVORCED     Spouse name: Not on file    Number of children: Not on file    Years of education: Not on file    Highest education level: Not on file   Occupational History    Not on file   Tobacco Use    Smoking status: Former     Packs/day: 1.00     Years: 28.00     Pack years: 28.00     Types: Cigarettes     Start date: 10/16/1977     Quit date: 04/25/2004     Years since quitting: 17.5    Smokeless tobacco: Never   Substance and Sexual Activity    Alcohol use: No    Drug use: No    Sexual activity: Not on file   Other Topics Concern    Not on file   Social History Narrative    Not on file     Social Determinants of Health     Financial Resource Strain: Not on file   Food Insecurity: Not on file   Transportation Needs: Not on file   Physical Activity: Not on file   Stress: Not on file   Social  Connections: Not on file   Intimate Partner Violence: Not on file   Housing Stability: Not on file     REVIEW OF SYSTEMS:      No changes from previous review of systems unless noted.    Prescription medication management discussed with patient.     Electronically signed by: Heloise Beecham, DO    Note: This note was completed using voice recognition software.  Any typographical/name errors or mistakes are unintentional.     Electronically signed by Lorin Picket,  Earle Gell, DO at 11/11/2021  2:06 PM EST

## 2021-11-13 NOTE — Telephone Encounter (Signed)
Pt saw Dr Garner Nash 122/6 stated she informed him that she is still unable to work. She needs a note stating how long to be out there is no return appointment in the system.

## 2021-11-17 ENCOUNTER — Ambulatory Visit: Admit: 2021-11-17 | Discharge: 2021-11-17 | Payer: MEDICARE | Primary: Internal Medicine

## 2021-11-18 LAB — COMPREHENSIVE METABOLIC PANEL
ALT: 14 IU/L (ref 0–32)
AST: 18 IU/L (ref 0–40)
Albumin/Globulin Ratio: 2 NA (ref 1.2–2.2)
Albumin: 4.6 g/dL (ref 3.7–4.7)
Alkaline Phosphatase: 101 IU/L (ref 44–121)
BUN/Creatinine Ratio: 6 NA — ABNORMAL LOW (ref 12–28)
BUN: 4 mg/dL — ABNORMAL LOW (ref 8–27)
CO2: 28 mmol/L (ref 20–29)
Calcium: 9.5 mg/dL (ref 8.7–10.3)
Chloride: 99 mmol/L (ref 96–106)
Creatinine: 0.68 mg/dL (ref 0.57–1.00)
Est, Glomerular Filtration Rate: 93 mL/min/{1.73_m2} (ref >59)
Globulin, Total: 2.3 g/dL (ref 1.5–4.5)
Glucose: 97 mg/dL (ref 70–99)
Potassium: 3.3 mmol/L — ABNORMAL LOW (ref 3.5–5.2)
Sodium: 142 mmol/L (ref 134–144)
Total Bilirubin: 0.5 mg/dL (ref 0.0–1.2)
Total Protein: 6.9 g/dL (ref 6.0–8.5)

## 2021-11-18 LAB — LIPID PANEL
Cholesterol, Total: 190 mg/dL (ref 100–199)
Cholesterol, total: 190 mg/dL (ref 100–199)
HDL Cholesterol: 66 mg/dL (ref >39)
HDL: 66 mg/dL (ref >39)
LDL Calculated: 103 mg/dL — ABNORMAL HIGH (ref 0–99)
LDL, calculated: 103 mg/dL — ABNORMAL HIGH (ref 0–99)
Triglyceride: 117 mg/dL (ref 0–149)
Triglycerides: 117 mg/dL (ref 0–149)
VLDL, calculated: 21 mg/dL (ref 5–40)
VLDL: 21 mg/dL (ref 5–40)

## 2021-11-18 LAB — VITAMIN D 25 HYDROXY: Vit D, 25-Hydroxy: 50.8 ng/mL (ref 30.0–100.0)

## 2021-11-18 LAB — CVD REPORT

## 2021-11-18 LAB — TSH 3RD GENERATION
TSH: 11 u[IU]/mL — ABNORMAL HIGH (ref 0.450–4.500)
TSH: 11 u[IU]/mL — ABNORMAL HIGH (ref 0.450–4.500)

## 2021-11-18 LAB — HEMOGLOBIN A1C W/O EAG
Hemoglobin A1C: 5.5 % (ref 4.8–5.6)
Hemoglobin A1c: 5.5 % (ref 4.8–5.6)

## 2021-11-18 LAB — METABOLIC PANEL, COMPREHENSIVE
A-G Ratio: 2 (ref 1.2–2.2)
ALT (SGPT): 14 IU/L (ref 0–32)
AST (SGOT): 18 IU/L (ref 0–40)
Albumin: 4.6 g/dL (ref 3.7–4.7)
Alk. phosphatase: 101 IU/L (ref 44–121)
BUN/Creatinine ratio: 6 — ABNORMAL LOW (ref 12–28)
BUN: 4 mg/dL — ABNORMAL LOW (ref 8–27)
Bilirubin, total: 0.5 mg/dL (ref 0.0–1.2)
CO2: 28 mmol/L (ref 20–29)
Calcium: 9.5 mg/dL (ref 8.7–10.3)
Chloride: 99 mmol/L (ref 96–106)
Creatinine: 0.68 mg/dL (ref 0.57–1.00)
GLOBULIN, TOTAL: 2.3 g/dL (ref 1.5–4.5)
Glucose: 97 mg/dL (ref 70–99)
Potassium: 3.3 mmol/L — ABNORMAL LOW (ref 3.5–5.2)
Protein, total: 6.9 g/dL (ref 6.0–8.5)
Sodium: 142 mmol/L (ref 134–144)
eGFR: 93 mL/min/{1.73_m2} (ref >59)

## 2021-11-18 LAB — VITAMIN D, 25 HYDROXY: VITAMIN D, 25-HYDROXY: 50.8 ng/mL (ref 30.0–100.0)

## 2021-11-18 NOTE — Telephone Encounter (Signed)
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

## 2021-11-19 NOTE — Telephone Encounter (Signed)
Spoke and informed patient that work note is ready for pick-up. Patient verberalized understanding.

## 2021-11-24 ENCOUNTER — Ambulatory Visit: Admit: 2021-11-24 | Discharge: 2021-11-24 | Payer: MEDICARE | Attending: Internal Medicine | Primary: Internal Medicine

## 2021-11-24 ENCOUNTER — Ambulatory Visit: Attending: Internal Medicine | Primary: Internal Medicine

## 2021-11-24 DIAGNOSIS — I10 Essential (primary) hypertension: Secondary | ICD-10-CM

## 2021-11-24 NOTE — Progress Notes (Signed)
Patient is in the office today for a 6 month follow up.    Do you have an Advance Directive no  Do you want more information : information given   1. "Have you been to the ER, urgent care clinic since your last visit?  Hospitalized since your last visit?" No    2. "Have you seen or consulted any other health care providers outside of the [facility] since your last visit?" No     3. For patients aged 71-75: Has the patient had a colonoscopy / FIT/ Cologuard? Yes - no Care Gap present      If the patient is female:    4. For patients aged 35-74: Has the patient had a mammogram within the past 2 years? Yes - no Care Gap present      5. For patients aged 21-65: Has the patient had a pap smear? NA - based on age or sex

## 2021-11-24 NOTE — Progress Notes (Signed)
Elaine [name] is a 70 y.o.  female and presents with Hypertension, Cholesterol Problem, Thyroid Problem (/), and Vitamin D Deficiency      SUBJECTIVE:  [name] HTN is well controlled on   DiovanHct 160/25 and Coreg 6.25 mg BID   [name] high cholesterol has been borderline controlled on Lipitor 40 mg. Pt  denies any side effects like myalgias. Compliance with medications has been an issue.  Pt has been controlling her prediabetes with diet and weight loss.      Thyroid Review:  Patient is seen for followup of hypothyroidism.   Thyroid ROS: denies fatigue, weight changes, heat/cold intolerance, bowel/skin changes or CVS symptoms. 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  Her TSH  is high on Synthroid 150 mcg daily due to pt missing doses.  She will try to take consistently daily.    [name] vit D level is  good on vit D 5000 units/day.     Patient has sleep apnea and is using a CPAP machine and followed by sleep specialist    She continues to have fungal infection in her toenails which is slowly improving with Lamisil.     Patient has asthma that is controlled on Singulair and  trelegy Ellipta.  Patient is followed by pulmonary who also sees her for sleep apnea.      Patient will continue to work on trying to lose weight and decrease her BMI from 36 with cutting back starches and sweets.    Patient has chronic diastolic heart failure which is stable and followed by cardiology    Respiratory ROS: negative for - shortness of breath  Cardiovascular ROS: negative for - chest pain    Current Outpatient Medications   Medication Sig    pantoprazole (PROTONIX) 40 mg tablet Take 1 Tablet by mouth daily.    potassium chloride (KLOR-CON M10) 10 mEq tablet TAKE 1 TABLET EVERY DAY    atorvastatin (LIPITOR) 40 mg tablet TAKE  1  TABLET  EVERY  DAY (NEED MD APPOINTMENT)    fluticasone-umeclidin-vilanter (Trelegy Ellipta) 200-62.5-25 mcg inhaler  Take 1 Puff by inhalation daily.    fluticasone propionate (FLONASE) 50 mcg/actuation nasal spray 2 Sprays by Both Nostrils route daily as needed.    iron dextran (Infed) 50 mg/mL injection 975 mg by IntraVENous route. Every 2 months    levothyroxine (SYNTHROID) 150 mcg tablet TAKE 1 TABLET EVERY DAY BEFORE BREAKFAST (NEED MD APPOINTMENT)    gabapentin (NEURONTIN) 300 mg capsule TAKE 1 CAPSULE EVERY MORNING AND TAKE 2 CAPSULES EVERY NIGHT    valsartan-hydroCHLOROthiazide (DIOVAN-HCT) 160-25 mg per tablet TAKE 1 TABLET EVERY DAY    lidocaine (LIDODERM) 5 % 1 Patch by TransDERmal route every twenty-four (24) hours. 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    coffee xt/phosphatidyl serine (NEURIVA ORIGINAL PO) Take  by mouth daily. 1 gummie    vit B complx C/folic acid/zinc (VIT B COMPLEX-C-FOLIC AC-ZINC PO) Take 1 Tab by mouth daily.    latanoprost (XALATAN) 0.005 % ophthalmic solution Administer 1 Drop to both eyes nightly.    multivitamin (ONE A DAY) tablet Take 1 Tab by mouth daily.    cholecalciferol (VITAMIN D3) (5000 Units/125 mcg) tab tablet Take 5,000 Units by mouth daily.    montelukast (Singulair) 10 mg tablet Take 1 Tablet by mouth daily. (Patient not taking: Reported on 11/24/2021)    albuterol (  PROVENTIL HFA, VENTOLIN HFA, PROAIR HFA) 90 mcg/actuation inhaler TAKE 2 PUFFS BY INHALATION EVERY FOUR (4) HOURS AS NEEDED FOR WHEEZING.     No current facility-administered medications for this visit.         OBJECTIVE:  alert, well appearing, and in no distress  Visit Vitals  BP (!) 98/52 (BP 1 Location: Left arm, BP Patient Position: Sitting, BP Cuff Size: Adult)   Pulse 88   Temp 98.3 ??F (36.8 ??C) (Temporal)   Resp 16   Ht '5\' 2"'  (1.575 m)   Wt 198 lb (89.8 kg)   SpO2 97%   BMI 36.21 kg/m??      well developed and well nourished  Chest - clear to auscultation, no wheezes, rales or rhonchi, symmetric air entry  Heart - normal rate, regular rhythm, normal S1, S2, no murmurs,  rubs, clicks or gallops  Extremities - peripheral pulses normal, no pedal edema, no clubbing or cyanosis    Labs:   Lab Results   Component Value Date/Time    Cholesterol, total 190 11/17/2021 03:46 AM    HDL Cholesterol 66 11/17/2021 03:46 AM    LDL, calculated 103 (H) 11/17/2021 03:46 AM    LDL, calculated 104 (H) 06/06/2019 09:38 AM    Triglyceride 117 11/17/2021 03:46 AM    CHOL/HDL Ratio 3.2 10/10/2018 03:55 PM     Lab Results   Component Value Date/Time    Sodium 142 11/17/2021 03:46 AM    Potassium 3.3 (L) 11/17/2021 03:46 AM    Chloride 99 11/17/2021 03:46 AM    CO2 28 11/17/2021 03:46 AM    Anion gap 2 (L) 05/26/2021 08:05 AM    Glucose 97 11/17/2021 03:46 AM    BUN 4 (L) 11/17/2021 03:46 AM    Creatinine 0.68 11/17/2021 03:46 AM    BUN/Creatinine ratio 6 (L) 11/17/2021 03:46 AM    GFR est AA >60 05/26/2021 08:05 AM    GFR est non-AA >60 05/26/2021 08:05 AM    Calcium 9.5 11/17/2021 03:46 AM    Bilirubin, total 0.5 11/17/2021 03:46 AM    ALT (SGPT) 14 11/17/2021 03:46 AM    Alk. phosphatase 101 11/17/2021 03:46 AM    Protein, total 6.9 11/17/2021 03:46 AM    Albumin 4.6 11/17/2021 03:46 AM    Globulin 3.1 10/10/2018 03:55 PM    A-G Ratio 2.0 11/17/2021 03:46 AM      Lab Results   Component Value Date/Time    Hemoglobin A1c 5.5 11/17/2021 03:46 AM    Hemoglobin A1c (POC) 5.3 05/26/2021 02:25 PM      Labs:   Lab Results   Component Value Date/Time    TSH 11.000 (H) 11/17/2021 03:46 AM    T4, Free 1.3 03/29/2017 05:30 PM          Discussed the patient's BMI with her.  The BMI follow up plan is as follows: I have counseled this patient on diet and exercise regimens.        Assessment/Plan    ICD-10-CM ICD-9-CM    1. HTN (hypertension), benign  I10 401.1 Well-controlled Diovan HCT METABOLIC PANEL, COMPREHENSIVE      2. Hypercholesteremia  E78.00 272.0 Borderline controlled on Lipitor 40 mg daily.  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  Patient to take Synthroid 150 mcg daily on a regular basis TSH 3RD GENERATION  4. Vitamin D deficiency  E55.9 268.9 Well-controlled vitamin D 5000's per day VITAMIN D, 25 HYDROXY            Follow-up and Dispositions    Return in about 4 months (around 03/25/2022) for labs 1 week before.             Reviewed plan of care. Patient has provided input and agrees with goals.

## 2021-12-31 ENCOUNTER — Ambulatory Visit
Admit: 2021-12-31 | Discharge: 2022-01-01 | Payer: MEDICARE | Attending: Physical Medicine & Rehabilitation | Primary: Internal Medicine

## 2021-12-31 ENCOUNTER — Ambulatory Visit: Attending: Physical Medicine & Rehabilitation | Primary: Internal Medicine

## 2021-12-31 DIAGNOSIS — M1612 Unilateral primary osteoarthritis, left hip: Secondary | ICD-10-CM

## 2021-12-31 NOTE — Progress Notes (Signed)
Progress Notes by Charm Barges, MD at 12/31/21 1530                Author: Charm Barges, MD  Service: --  Author Type: Physician       Filed: 12/31/21 1633  Encounter Date: 12/31/2021  Status: Signed          Editor: Charm Barges, MD (Physician)                      [facility]  [address]   [place] [address]   Phone: [phone]   Fax: [phone]         Patient: Elaine [name]                                                                               MRN: [id number]         Date of Birth: 1951-01-24           AGE: 71 y.o.               PCP: Thana Farr, MD   Date:  12/31/21      Reason for Consultation: Back Pain (Lower) and Hip Pain (Left/)         HPI:   Elaine [name] is a 71 y.o. female with relevant PMH of  C3-5 fusion in 2010, HTN, HLD  who presented with chronic low back pain which has been presents with over 30 years of intermittent which has  become constant over the past few months.  She was previously seen by pain management many years ago and tried epidural injections which helped for a period of time but then she decided to stop getting them. Her last MRI 2011 demonstrated a left L3/4  disc protrusion with moderate left foraminal stenosis, L5/S1 severe bilateral foraminal stenosis.  She started a course of PT  for her low back         More recently she saw Dr. Garner Nash with left hip pain 10/07/2021.  She states she woke one morning in October and could not put weight on her left hip.  She saw her PCP who gave her a prednisone pack which helped.  She had x-rays of her left hip with mild  hip OA.  She saw Dr. Garner Nash and tried fluoro guided hip injections 10/21/21 which she states has helped reduce her pain and today she has minimal pain       Works at [employer], had taken off since October when hip pain started but retrned 12/09/21         Neurologic symptoms: No numbness, tingling,  weakness, bowel or bladder changes.  No recent falls        Location: The pain is located in the low back pain , left groin   Radiation: The pain does not radiate.     Pain Score: Currently: 3/10    Quality: Pain is of a Stabbing quality.     Aggravating: Pain is exacerbated by walking and standing   Alleviating: The pain is alleviated by lying down, sitting      Prior Treatments:  Physical therapy:   Completed  PT- [facility] [address]   Injections:YES-   ESI-  helped a bit but stopped several years ago    11/15//2022 Left hip injection fluoro guided reduced pain 9/10- 6/10    Chiropractic treatments- years ago which helped    Previous Medications:    Current Medications: tylenol , gabapentin 300mg  in am, 600mg  at night    Previous work-up has included:    Bone density 2016- wnl   X-ray left hip and pelvis-09/2021   No fracture or dislocation. Mild degenerative changes of the bilateral hips and   sacroiliac joints.       Sclerotic focus at the right femoral neck appears similar compared to prior       MRI cervical spine 2016   No enhancing abnormalities.   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 No associated hydrocephalus, or cord syrinx.       Postoperative changes after anterior plating and fusion, C3-C5.    Degenerative spondylosis, C5/C6 and C6/C7, without significant alterations, all other is an element of mild central canal stenosis on a multifactorial basis, C5-C6.       Central canal contents, and paravertebral soft tissues otherwise unremarkable.          MRI lumbar spine 2011   L1-L2: Unremarkable.   L2-L3: Also unremarkable.   L3-L4: Posterior disc bulge and left postero lateral corner   protrusion. AP central canal is more than 10 mm.  No deformity of the  thecal sac. Mild facet and ligamentous hypertrophy. Mild right  foraminal stenosis with mild indentation of exiting nerve root from  posterior disc bulge. On the left, moderate left foraminal stenosis   with more compression of  the left  exiting L3 nerve root from disc  protrusion. Slightly more severe left facet hypertrophy and  ligamentous hypertrophy as well.  Overall, grossly stable since last  MRI   L4-L5: Posterior disc bulge with mild central stenosis. AP central   canal measures 9 mm. More severe facet arthropathy with moderate  right facet joint effusion which is new. No significant right  foraminal stenosis. Mild to moderate left foraminal stenosis with  mild impingement of the left exiting L4 nerve root from  posterior   disc bulge. Facet joint effusion also present on the left.  Slightly  more severe than before.   L5-S1: Left posterolateral disc protrusion, indenting thecal sac, likely S1 root on the left. Facet hypertrophy also present. Severe   bilateral foraminal stenosis with impingement of bilateral L5   exiting nerve root.  Slightly more severe than before   IMPRESSION: Stable to mild progression of left-sided disease in the  lower  lumbar levels as above.     Past Medical History:      Past Medical History:        Diagnosis  Date         ?  Anemia       ?  Asthma       ?  Colon polyp       ?  DDD (degenerative disc disease)  02/15/2009     ?  Depression  12/18/2011     ?  Essential hypertension, benign       ?  History of echocardiogram  03/20/2004          EF 70%.  Borderline DDfx.  No significant valvular pathology.         ?  Hypercholesterolemia       ?  Hypertension       ?  Iron deficiency anemia  02/15/2009     ?  Lichen planus  1/61/0960     ?  Lower extremity venous duplex  10/23/2009          Left leg:  No DVT.           ?  Meralgia paraesthetica  10/22/2013     ?  OA (osteoarthritis of spine)  02/15/2009     ?  OA (osteoarthritis of spine)  02/15/2009     ?  Obesity, unspecified       ?  Other and unspecified hyperlipidemia       ?  Pre-operative cardiovascular examination            For spine surgery         ?  Right sided sciatica       ?  Shortness of breath            Possible asthma, HCVD; less likely CAD (Noted  03/15/09)         ?  Sleep apnea  02/15/2009          uses cpap machine         ?  Thallium stress test   03/20/2004          Partially transient, mod basal & mid anterior defect most c/w artifact; mild anterior ischemia less likely.  Neg EKG on pharm stress test.         ?  Thyroid disease            hypothyroidism         Past Surgical History:      Past Surgical History:         Procedure  Laterality  Date          ?  COLONOSCOPY  N/A  04/25/2020          SCREENING COLONOSCOPY performed by Sloan Leiter, MD at [facility] ENDOSCOPY          ?  HX COLONOSCOPY    02-24-12          normal (Dr. Angelique Holm)          ?  HX HERNIA REPAIR              3x          ?  HX HYSTERECTOMY         ?  HX TUBAL LIGATION         ?  PR COLONOSCOPY FLX DX W/COLLJ SPEC WHEN PFRMD    02-2007          +polyp(tubular adenoma); Dr Angelique Holm          ?  PR UNLISTED NEUROLOGICAL/NEUROMUSCULAR DX PX    03-16-2009          s/p ACD & Fusion (Dr.P.Gurtner)         SocHx:      Social History          Tobacco Use         ?  Smoking status:  Former              Packs/day:  1.00         Years:  28.00         Pack years:  28.00         Types:  Cigarettes         Start date:  10/16/1977  Quit date:  04/25/2004         Years since quitting:  17.6         ?  Smokeless tobacco:  Never       Substance Use Topics         ?  Alcohol use:  No         FamHx:?      Family History         Problem  Relation  Age of Onset          ?  Cancer  Mother       ?  Hypertension  Mother       ?  Hypertension  Sister                x3          ?  Heart Surgery  Sister       ?  Heart Disease  Sister       ?  Heart Attack  Sister       ?  Cancer  Maternal Aunt                breast          ?  Breast Cancer  Maternal Aunt       ?  Glaucoma  Son       ?  Heart defect  Son       ?  Diabetes  Paternal Uncle            ?  Hypertension  Paternal Uncle            Current Medications:       Current Outpatient Medications          Medication  Sig  Dispense  Refill           ?  pantoprazole  (PROTONIX) 40 mg tablet  Take 1 Tablet by mouth daily.         ?  potassium chloride (KLOR-CON M10) 10 mEq tablet  TAKE 1 TABLET EVERY DAY  90 Tablet  3     ?  atorvastatin (LIPITOR) 40 mg tablet  TAKE  1  TABLET  EVERY  DAY (NEED MD APPOINTMENT)  90 Tablet  1     ?  fluticasone-umeclidin-vilanter (Trelegy Ellipta) 200-62.5-25 mcg inhaler  Take 1 Puff by inhalation daily.  2 Each  0     ?  fluticasone propionate (FLONASE) 50 mcg/actuation nasal spray  2 Sprays by Both Nostrils route daily as needed.         ?  iron dextran (Infed) 50 mg/mL injection  975 mg by IntraVENous route. Every 2 months         ?  levothyroxine (SYNTHROID) 150 mcg tablet  TAKE 1 TABLET EVERY DAY BEFORE BREAKFAST (NEED MD APPOINTMENT)  90 Tablet  0     ?  gabapentin (NEURONTIN) 300 mg capsule  TAKE 1 CAPSULE EVERY MORNING AND TAKE 2 CAPSULES EVERY NIGHT  270 Capsule  2     ?  valsartan-hydroCHLOROthiazide (DIOVAN-HCT) 160-25 mg per tablet  TAKE 1 TABLET EVERY DAY  90 Tablet  1     ?  lidocaine (LIDODERM) 5 %  1 Patch by TransDERmal route every twenty-four (24) hours. 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  ZINC PO  Take 1 Tablet by mouth daily.         ?  coffee xt/phosphatidyl serine (NEURIVA ORIGINAL PO)  Take  by mouth daily. 1 gummie               ?  albuterol (PROVENTIL HFA, VENTOLIN HFA, PROAIR HFA) 90 mcg/actuation inhaler  TAKE 2 PUFFS BY INHALATION EVERY FOUR (4) HOURS AS NEEDED FOR WHEEZING.  1 Inhaler  5           ?  vit B complx C/folic acid/zinc (VIT B COMPLEX-C-FOLIC AC-ZINC PO)  Take 1 Tab by mouth daily.         ?  latanoprost (XALATAN) 0.005 % ophthalmic solution  Administer 1 Drop to both eyes nightly.         ?  multivitamin (ONE A DAY) tablet  Take 1 Tab by mouth daily.         ?  cholecalciferol (VITAMIN D3) (5000 Units/125 mcg) tab tablet  Take 5,000 Units by mouth daily.               ?  montelukast (Singulair) 10 mg tablet  Take 1 Tablet by mouth daily.  90 Tablet  3         Allergies:       Allergies         Allergen  Reactions         ?  Aspirin  Other (comments)             GI distress,          ?  Asa-Acetaminophen-Caff-Potass  Other (comments)             Bleeding in stomach         ?  Benadryl [Diphenhydramine Hcl]  Other (comments)             Muscle jerking         ?  Other Plant, Higher education careers adviser, Environmental  Not Reported This Time             Grass, Dust and Mold         ?  Pollen Extracts  Not Reported This Time            Review of Systems:    Gen:    Denied fevers, chills, malaise, fatigue, weight changes    Resp: Denied shortness of breath, cough, wheezing    CVS: Denied chest pain, palpitations    GU: Denied urinary urgency, frequency, incontinence    GI: Denied nausea, vomiting, constipation, diarrhea    Skin: Denied rashes, wounds    Psych: Denied anxiety, depression    Vasc: Denied claudication, ulcers    Hem: Denied easy bruising/bleeding    MSK: See HPI    Neuro: See HPI            Physical Exam       Vital Signs: Visit Vitals      Pulse  64     Temp  97.8 ??F (36.6 ??C) (Oral)     Ht  5\' 2"  (1.575 m)     Wt  200 lb (90.7 kg)         SpO2  96%  Comment: RA        BMI  36.58 kg/m??         General: ??????? Well nourished and well developed female without any acute distress    Psychiatric: ?  Alert and oriented x 3 with normal mood     HEENT: ????????  Atraumatic    Respiratory:   Breathing non-labored  and non dyspneic    CV: ???????????????? Peripheral pulses intact, no peripheral edema    Skin: ?????????????  No rashes          Neurologic: ??        Sensation: normal and grossly intact thebilateral, lower extremity(s)    Strength: 5/5 in the bilateral, lower extremity(s) - 4/5 strength left hip flexion -no pain   Reflexes: reveals 2+ symmetric DTRs throughout     Gait: normal       Musculoskeletal: Lumbar Exam       Inspection:    Alignment: Normal   Atrophy: None          Tenderness to Palpation:    Lumbar paraspinals Positive   Lumbar spinous processes Negative   SI Joint:  Negative   Gluteal:Negative    Greater trochanter: Positive b/l         ROM:    Lumbar ROM: Normal   Lumbar facet loading: Positive   Hip ROM: No reproduction of pain with movement seated      Special Tests        Slump test: Negative    SLR: Negative   FABER: Negative   FADIR: Negative   Log Roll: Negative         Medical Decision Making:     Images: The imaging results    Labs:  The results below were reviewed.       X-ray left hip reviewed as above- mild hip OA         Assessment:    -lumbar spondylosis/- lumbar facet arthritis   - left hip pain- vs left lumbar radiculitis            Plan:        -Physical therapy - She would like to try water aerobics and is going to try the [employer]   -Medications - lidocaine patch. Counseled regarding side effects and appropriate administration of medications.     -Diagnostics/Imaging - consider repeat imaging lumbar spine -MRI    -Injections - NA . Can repeat left hip IA injection if needed in 3 months    -Lifestyle - Encouraged regular aerobic exercise    -Education - The patient's diagnosis, prognosis and treatment options were discussed today. All questions were answered.    F/U - in 3week(s) or sooner if needed.  Consider imaging lumbar spine - possible repeat left hip injection vs ESI            Anna-Christina Ceniyah Thorp MD   [facility]             ?

## 2021-12-31 NOTE — Progress Notes (Signed)
Formatting of this note is different from the original.    Tula Nakayama presents today for   Chief Complaint   Patient presents with    Back Pain     Lower    Hip Pain     Left      Is someone accompanying this pt? no    Is the patient using any DME equipment during OV? Yes, cane    Depression Screening:  3 most recent PHQ Screens 11/24/2021   Little interest or pleasure in doing things Not at all   Feeling down, depressed, irritable, or hopeless Not at all   Total Score PHQ 2 0   Trouble falling or staying asleep, or sleeping too much -   Feeling tired or having little energy -   Poor appetite, weight loss, or overeating -   Feeling bad about yourself - or that you are a failure or have let yourself or your family down -   Trouble concentrating on things such as school, work, reading, or watching TV -   Moving or speaking so slowly that other people could have noticed; or the opposite being so fidgety that others notice -   Thoughts of being better off dead, or hurting yourself in some way -   PHQ 9 Score -     Learning Assessment:  Learning Assessment 03/07/2018   PRIMARY LEARNER Patient   HIGHEST LEVEL OF EDUCATION - PRIMARY LEARNER  -   BARRIERS PRIMARY LEARNER -   CO-LEARNER CAREGIVER -   PRIMARY LANGUAGE ENGLISH   LEARNER PREFERENCE PRIMARY READING   ANSWERED BY patient   RELATIONSHIP SELF     Abuse Screening:  Abuse Screening Questionnaire 10/20/2021   Do you ever feel afraid of your partner? N   Are you in a relationship with someone who physically or mentally threatens you? N   Is it safe for you to go home? Y     Fall Risk  Fall Risk Assessment, last 12 mths 11/24/2021   Able to walk? Yes   Fall in past 12 months? 0   Do you feel unsteady? 0   Are you worried about falling 0   Is TUG test greater than 12 seconds? -   Is the gait abnormal? -   Number of falls in past 12 months -     Coordination of Care:  1. Have you been to the ER, urgent care clinic since your last visit? no  Hospitalized since your  last visit? no    2. Have you seen or consulted any other health care providers outside of the [facility] since your last visit? Yes, pcp Include any pap smears or colon screening. no      Electronically signed by Gardiner Sleeper, LPN at 78/29/5621  4:06 PM EST

## 2021-12-31 NOTE — Progress Notes (Signed)
[name] presents today for   Chief Complaint   Patient presents with    Back Pain     Lower    Hip Pain     Left         Is someone accompanying this pt? no    Is the patient using any DME equipment during OV? Yes, cane    Depression Screening:  3 most recent PHQ Screens 11/24/2021   Little interest or pleasure in doing things Not at all   Feeling down, depressed, irritable, or hopeless Not at all   Total Score PHQ 2 0   Trouble falling or staying asleep, or sleeping too much -   Feeling tired or having little energy -   Poor appetite, weight loss, or overeating -   Feeling bad about yourself - or that you are a failure or have let yourself or your family down -   Trouble concentrating on things such as school, work, reading, or watching TV -   Moving or speaking so slowly that other people could have noticed; or the opposite being so fidgety that others notice -   Thoughts of being better off dead, or hurting yourself in some way -   PHQ 9 Score -       Learning Assessment:  Learning Assessment 03/07/2018   PRIMARY LEARNER Patient   HIGHEST LEVEL OF EDUCATION - PRIMARY LEARNER  -   BARRIERS PRIMARY LEARNER -   CO-LEARNER CAREGIVER -   PRIMARY LANGUAGE ENGLISH   LEARNER PREFERENCE PRIMARY READING   ANSWERED BY patient   RELATIONSHIP SELF       Abuse Screening:  Abuse Screening Questionnaire 10/20/2021   Do you ever feel afraid of your partner? N   Are you in a relationship with someone who physically or mentally threatens you? N   Is it safe for you to go home? Y       Fall Risk  Fall Risk Assessment, last 12 mths 11/24/2021   Able to walk? Yes   Fall in past 12 months? 0   Do you feel unsteady? 0   Are you worried about falling 0   Is TUG test greater than 12 seconds? -   Is the gait abnormal? -   Number of falls in past 12 months -       Coordination of Care:  1. Have you been to the ER, urgent care clinic since your last visit? no  Hospitalized since your last visit? no    2. Have you seen or consulted  any other health care providers outside of the [place] since your last visit? Yes, pcp Include any pap smears or colon screening. no

## 2021-12-31 NOTE — Progress Notes (Signed)
Formatting of this note is different from the original.  Images from the original note were not included.      [facility]  [address], [address]  [place], [place] [address]  Phone: [phone]  Fax: [phone]    Patient: Elaine [name]                                                                              MRN: [id number]        Date of Birth: 1951/05/25          AGE: 71 y.o.             PCP: Marvia Pickles, MD  Date:  12/31/21    Reason for Consultation: Back Pain (Lower) and Hip Pain (Left/)    HPI:  Elaine [name] is a 71 y.o. female with relevant PMH of  C3-5 fusion in 2010, HTN, HLD  who presented with chronic low back pain which has been presents with over 30 years of intermittent which has become constant over the past few months.  She was previously seen by pain management many years ago and tried epidural injections which helped for a period of time but then she decided to stop getting them. Her last MRI 2011 demonstrated a left L3/4 disc protrusion with moderate left foraminal stenosis, L5/S1 severe bilateral foraminal stenosis.  She started a course of PT  for her low back       More recently she saw Dr. Lorin Picket with left hip pain 10/07/2021.  She states she woke one morning in October and could not put weight on her left hip.  She saw her PCP who gave her a prednisone pack which helped.  She had x-rays of her left hip with mild hip OA.  She saw Dr. Lorin Picket and tried fluoro guided hip injections 10/21/21 which she states has helped reduce her pain and today she has minimal pain     Works at [employer], had taken off since October when hip pain started but retrned 12/09/21    Neurologic symptoms: No numbness, tingling, weakness, bowel or bladder changes.  No recent falls      Location: The pain is located in the low back pain , left groin  Radiation: The pain does not radiate.    Pain Score: Currently: 3/10   Quality: Pain is of a Stabbing quality.     Aggravating: Pain is exacerbated by walking and standing  Alleviating: The pain is alleviated by lying down, sitting    Prior Treatments:  Physical therapy:  Completed  PT- [facility]	[place]  Injections:YES-  ESI-  helped a bit but stopped several years ago   11/15//2022 Left hip injection fluoro guided reduced pain 9/10- 6/10   Chiropractic treatments- years ago which helped   Previous Medications:   Current Medications: tylenol , gabapentin 300mg  in am, 600mg  at night   Previous work-up has included:   Bone density 2016- wnl  X-ray left hip and pelvis-09/2021  No fracture or dislocation. Mild degenerative changes of the bilateral hips and  sacroiliac joints.    Sclerotic focus at the right femoral neck appears similar  compared to prior     MRI cervical spine 2016  No enhancing abnormalities.  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 No associated hydrocephalus, or cord syrinx.    Postoperative changes after anterior plating and fusion, C3-C5.   Degenerative spondylosis, C5/C6 and C6/C7, without significant alterations, all other is an element of mild central canal stenosis on a multifactorial basis, C5-C6.    Central canal contents, and paravertebral soft tissues otherwise unremarkable.      MRI lumbar spine 2011  L1-L2: Unremarkable.   L2-L3: Also unremarkable.   L3-L4: Posterior disc bulge and left postero lateral corner   protrusion. AP central canal is more than 10 mm. No deformity of the  thecal sac. Mild facet and ligamentous hypertrophy. Mild right  foraminal stenosis with mild indentation of exiting nerve root from  posterior disc bulge. On the left, moderate left foraminal stenosis   with more compression of the left exiting L3 nerve root from disc  protrusion. Slightly more severe left facet hypertrophy and  ligamentous hypertrophy as well.  Overall, grossly stable since last  MRI   L4-L5: Posterior disc bulge with mild central stenosis. AP central  canal measures 9 mm. More severe facet  arthropathy with moderate  right facet joint effusion which is new. No significant right  foraminal stenosis. Mild to moderate left foraminal stenosis with  mild impingement of the left exiting L4 nerve root from posterior   disc bulge. Facet joint effusion also present on the left.  Slightly  more severe than before.   L5-S1: Left posterolateral disc protrusion, indenting thecal sac, likely S1 root on the left. Facet hypertrophy also present. Severe  bilateral foraminal stenosis with impingement of bilateral L5   exiting nerve root.  Slightly more severe than before   IMPRESSION: Stable to mild progression of left-sided disease in the  lower  lumbar levels as above.    Past Medical History:   Past Medical History:   Diagnosis Date    Anemia     Asthma     Colon polyp     DDD (degenerative disc disease) 02/15/2009    Depression 12/18/2011    Essential hypertension, benign     History of echocardiogram 03/20/2004    EF 70%.  Borderline DDfx.  No significant valvular pathology.    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      Meralgia paraesthetica 10/22/2013    OA (osteoarthritis of spine) 02/15/2009    OA (osteoarthritis of spine) 02/15/2009    Obesity, unspecified     Other and unspecified hyperlipidemia     Pre-operative cardiovascular examination     For spine surgery    Right sided sciatica     Shortness of breath     Possible asthma, HCVD; less likely CAD (Noted 03/15/09)    Sleep apnea 02/15/2009    uses cpap machine    Thallium stress test  03/20/2004    Partially transient, mod basal & mid anterior defect most c/w artifact; mild anterior ischemia less likely.  Neg EKG on pharm stress test.    Thyroid disease     hypothyroidism     Past Surgical History:   Past Surgical History:   Procedure Laterality Date    COLONOSCOPY N/A 04/25/2020    SCREENING COLONOSCOPY performed by Raynelle Jan, MD  at [facility] END[facility]COPY    HX COLONOSCOPY  02-24-12    normal (Dr. Adelene Idler)    HX HERNIA REPAIR      3x    HX HYSTERECTOMY      HX TUBAL LIGATION      PR COLONOSCOPY FLX DX W/COLLJ SPEC WHEN PFRMD  02-2007    +polyp(tubular adenoma); Dr Adelene Idler    PR UNLISTED NEUROLOGICAL/NEUROMUSCULAR DX PX  03-16-2009    s/p ACD & Fusion (Dr.P.Gurtner)     SocHx:   Social History     Tobacco Use    Smoking status: Former     Packs/day: 1.00     Years: 28.00     Pack years: 28.00     Types: Cigarettes     Start date: 10/16/1977     Quit date: 04/25/2004     Years since quitting: 17.6    Smokeless tobacco: Never   Substance Use Topics    Alcohol use: No     FamHx:?   Family History   Problem Relation Age of Onset    Cancer Mother     Hypertension Mother     Hypertension Sister         x3    Heart Surgery Sister     Heart Disease Sister     Heart Attack Sister     Cancer Maternal Aunt         breast    Breast Cancer Maternal Aunt     Glaucoma Son     Heart defect Son     Diabetes Paternal Uncle     Hypertension Paternal Uncle      Current Medications:    Current Outpatient Medications   Medication Sig Dispense Refill    pantoprazole (PROTONIX) 40 mg tablet Take 1 Tablet by mouth daily.      potassium chloride (KLOR-CON M10) 10 mEq tablet TAKE 1 TABLET EVERY DAY 90 Tablet 3    atorvastatin (LIPITOR) 40 mg tablet TAKE  1  TABLET  EVERY  DAY (NEED MD APPOINTMENT) 90 Tablet 1    fluticasone-umeclidin-vilanter (Trelegy Ellipta) 200-62.5-25 mcg inhaler Take 1 Puff by inhalation daily. 2 Each 0    fluticasone propionate (FLONASE) 50 mcg/actuation nasal spray 2 Sprays by Both Nostrils route daily as needed.      iron dextran (Infed) 50 mg/mL injection 975 mg by IntraVENous route. Every 2 months      levothyroxine (SYNTHROID) 150 mcg tablet TAKE 1 TABLET EVERY DAY BEFORE BREAKFAST (NEED MD APPOINTMENT) 90 Tablet 0    gabapentin (NEURONTIN) 300 mg capsule TAKE 1 CAPSULE EVERY MORNING AND TAKE 2 CAPSULES EVERY NIGHT 270 Capsule 2     valsartan-hydroCHLOROthiazide (DIOVAN-HCT) 160-25 mg per tablet TAKE 1 TABLET EVERY DAY 90 Tablet 1    lidocaine (LIDODERM) 5 % 1 Patch by TransDERmal route every twenty-four (24) hours. 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      coffee xt/phosphatidyl serine (NEURIVA ORIGINAL PO) Take  by mouth daily. 1 gummie      albuterol (PROVENTIL HFA, VENTOLIN HFA, PROAIR HFA) 90 mcg/actuation inhaler TAKE 2 PUFFS BY INHALATION EVERY FOUR (4) HOURS AS NEEDED FOR WHEEZING. 1 Inhaler 5    vit B complx C/folic acid/zinc (VIT B COMPLEX-C-FOLIC AC-ZINC PO) Take 1 Tab by mouth daily.      latanoprost (XALATAN) 0.005 % ophthalmic solution Administer 1 Drop to both eyes nightly.      multivitamin (  ONE A DAY) tablet Take 1 Tab by mouth daily.      cholecalciferol (VITAMIN D3) (5000 Units/125 mcg) tab tablet Take 5,000 Units by mouth daily.      montelukast (Singulair) 10 mg tablet Take 1 Tablet by mouth daily. 90 Tablet 3     Allergies:    Allergies   Allergen Reactions    Aspirin Other (comments)     GI distress,     Asa-Acetaminophen-Caff-Potass Other (comments)     Bleeding in stomach    Benadryl [Diphenhydramine Hcl] Other (comments)     Muscle jerking    Other Plant, Animal, Environmental Not Reported This Time     Grass, Dust and Mold    Pollen Extracts Not Reported This Time       Review of Systems:   Gen:    Denied fevers, chills, malaise, fatigue, weight changes   Resp: Denied shortness of breath, cough, wheezing   CVS: Denied chest pain, palpitations   GU: Denied urinary urgency, frequency, incontinence   GI: Denied nausea, vomiting, constipation, diarrhea   Skin: Denied rashes, wounds   Psych: Denied anxiety, depression   Vasc: Denied claudication, ulcers   Hem: Denied easy bruising/bleeding   MSK: See HPI   Neuro: See HPI        Physical Exam     Vital Signs: Visit Vitals  Pulse 64   Temp 97.8 F (36.6 C) (Oral)   Ht 5\' 2"  (1.575 m)   Wt 200 lb (90.7 kg)   SpO2 96%  Comment: RA   BMI 36.58 kg/m     General: ??????? Well nourished and well developed female without any acute distress   Psychiatric: ?  Alert and oriented x 3 with normal mood    HEENT: ????????  Atraumatic   Respiratory:   Breathing non-labored and non dyspneic   CV: ???????????????? Peripheral pulses intact, no peripheral edema   Skin: ?????????????  No rashes     Neurologic: ??      Sensation: normal and grossly intact thebilateral, lower extremity(s)   Strength: 5/5 in the bilateral, lower extremity(s) - 4/5 strength left hip flexion -no pain  Reflexes: reveals 2+ symmetric DTRs throughout    Gait: normal     Musculoskeletal: Lumbar Exam     Inspection:   Alignment: Normal  Atrophy: None     Tenderness to Palpation:   Lumbar paraspinals Positive  Lumbar spinous processes Negative  SI Joint:  Negative  Gluteal:Negative  Greater trochanter: Positive b/l    ROM:   Lumbar ROM: Normal  Lumbar facet loading: Positive  Hip ROM: No reproduction of pain with movement seated    Special Tests      Slump test: Negative   SLR: Negative  FABER: Negative  FADIR: Negative  Log Roll: Negative    Medical Decision Making:    Images: The imaging results   Labs:  The results below were reviewed.     X-ray left hip reviewed as above- mild hip OA    Assessment:   -lumbar spondylosis/- lumbar facet arthritis  - left hip pain- vs left lumbar radiculitis    Plan:      -Physical therapy - She would like to try water aerobics and is going to try the [employer]  -Medications - lidocaine patch. Counseled regarding side effects and appropriate administration of medications.    -Diagnostics/Imaging - consider repeat imaging lumbar spine -MRI   -Injections - NA . Can repeat left hip IA injection  if needed in 3 months   -Lifestyle - Encouraged regular aerobic exercise   -Education - The patient's diagnosis, prognosis and treatment options were discussed today. All questions were answered.   F/U - in 3week(s) or sooner if needed.  Consider  imaging lumbar spine - possible repeat left hip injection vs ESI    Anna-Christina Bevelaqua MD  [facility]        ?                                   Electronically signed by Lennox Pippins, MD at 12/31/2021  4:33 PM EST

## 2022-01-06 ENCOUNTER — Encounter

## 2022-01-06 MED ORDER — TERBINAFINE 250 MG TAB
250 mg | ORAL_TABLET | ORAL | 0 refills | Status: AC
Start: 2022-01-06 — End: ?

## 2022-01-06 MED ORDER — LEVOTHYROXINE 150 MCG TAB
150 mcg | ORAL_TABLET | ORAL | 0 refills | Status: AC
Start: 2022-01-06 — End: ?

## 2022-01-10 ENCOUNTER — Encounter

## 2022-01-11 ENCOUNTER — Encounter

## 2022-01-13 ENCOUNTER — Encounter

## 2022-01-13 MED ORDER — MONTELUKAST 10 MG TAB
10 mg | ORAL_TABLET | Freq: Every day | ORAL | 3 refills | Status: AC
Start: 2022-01-13 — End: ?

## 2022-01-13 NOTE — Telephone Encounter (Signed)
Last OV: 11/24/2021  Next OV: 03/30/2022  Last refill: 01/17/2021

## 2022-01-19 ENCOUNTER — Encounter

## 2022-03-05 NOTE — Telephone Encounter (Signed)
Patient stating she needs Dr. 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 Stating she has arthritis in her lower back and found out last year that she needs a hip replacement. Stating she can't stand bouncing around on a school bus.    Call patient at [phone] once completed and ready for pick up.

## 2022-03-10 NOTE — Telephone Encounter (Signed)
Letter done

## 2022-03-11 NOTE — Telephone Encounter (Signed)
Left message for patient letter is ready for pick up at the front office.

## 2022-03-19 ENCOUNTER — Encounter

## 2022-03-23 ENCOUNTER — Encounter: Primary: Internal Medicine

## 2022-03-23 ENCOUNTER — Ambulatory Visit: Admit: 2022-03-23 | Discharge: 2022-03-23 | Payer: PRIVATE HEALTH INSURANCE | Primary: Internal Medicine

## 2022-03-23 ENCOUNTER — Ambulatory Visit
Admit: 2022-03-23 | Discharge: 2022-03-23 | Payer: PRIVATE HEALTH INSURANCE | Attending: Physical Medicine & Rehabilitation | Primary: Internal Medicine

## 2022-03-23 DIAGNOSIS — M1612 Unilateral primary osteoarthritis, left hip: Secondary | ICD-10-CM

## 2022-03-23 NOTE — Progress Notes (Signed)
Chief Complaint   Patient presents with    Follow-up       Pt preferred language for health care discussion is english.    Is someone accompanying this pt? no    Is the patient using any DME equipment during OV? no    Depression Screening:  No flowsheet data found.    Learning Assessment:  No flowsheet data found.    Abuse Screening:  No flowsheet data found.    Fall Risk  No flowsheet data found.        Advance Directive:  1. Do you have an advance directive in place? Patient Reply:no    2. If not, would you like material regarding how to put one in place? Patient Reply: no    2.  Per patient no changes to their ACP contact no.      Coordination of Care:  1. Have you been to the ER, urgent care clinic since your last visit? Hospitalized since your last visit? no    2. Have you seen or consulted any other health care providers outside of the [facility] since your last visit? Include any pap smears or colon screening. no

## 2022-03-23 NOTE — Progress Notes (Signed)
[facility]  [address], [place] [address]   Phone: [phone]   Fax: [phone]         Patient: Elaine [name]                                                                               MRN: [id number]         Date of Birth: 07/26/1951           AGE: 71 y.o.               PCP: Thana Farr, MD        Reason for Consultation: Back Pain (Lower) and Hip Pain (Left/)         HPI:   Elaine [name] is a 71 y.o. female with relevant PMH of  C3-5 fusion in 2010, HTN, HLD  who presented with chronic low back pain which has been presents with over 30 years of intermittent which has  become constant over the past few months.  She was previously seen by pain management many years ago and tried epidural injections which helped for a period of time but then she decided to stop getting them. Her last MRI 2011 demonstrated a left L3/4  disc protrusion with moderate left foraminal stenosis, L5/S1 severe bilateral foraminal stenosis.  She started a course of PT  for her low back         More recently she saw Dr. Garner Nash with left hip pain 10/07/2021.  She states she woke one morning in October and could not put weight on her left hip.  She saw her PCP who gave her a prednisone pack which helped.  She had x-rays of her left hip with mild  hip OA.  She saw Dr. Garner Nash and tried fluoro guided hip injections 10/21/21 which helped for about 1 month. She has decided to have her hip replaced and is scheduled 06/17/22      Works at [employer], had taken off since October when hip pain started but retrned 12/09/21         Neurologic symptoms: No numbness, tingling, weakness, bowel or bladder changes.  No recent falls        Location: The pain is located in the low back pain , left groin   Radiation: The pain does not radiate.     Pain Score: Currently: 4/10    Quality: Pain is of a Stabbing quality.     Aggravating: Pain is exacerbated by walking and standing    Alleviating: The pain is alleviated by lying down, sitting      Prior Treatments:   Physical therapy:   Completed  PT- [facility] [employer]   Injections:YES-   ESI-  helped a bit but stopped several years ago    11/15//2022 Left hip injection fluoro guided reduced pain 9/10- 6/10    Chiropractic treatments- years ago which helped    Previous Medications:    Current Medications: tylenol , gabapentin '300mg'$  in am, '600mg'$  at night    Previous work-up has included:    Bone density 2016- wnl   X-ray left  hip and pelvis-09/2021   No fracture or dislocation. Mild degenerative changes of the bilateral hips and   sacroiliac joints.       Sclerotic focus at the right femoral neck appears similar compared to prior       MRI cervical spine 2016   No enhancing abnormalities.   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 No associated hydrocephalus, or cord syrinx.       Postoperative changes after anterior plating and fusion, C3-C5.    Degenerative spondylosis, C5/C6 and C6/C7, without significant alterations, all other is an element of mild central canal stenosis on a multifactorial basis, C5-C6.       Central canal contents, and paravertebral soft tissues otherwise unremarkable.          MRI lumbar spine 2011   L1-L2: Unremarkable.   L2-L3: Also unremarkable.   L3-L4: Posterior disc bulge and left postero lateral corner   protrusion. AP central canal is more than 10 mm.  No deformity of the  thecal sac. Mild facet and ligamentous hypertrophy. Mild right  foraminal stenosis with mild indentation of exiting nerve root from  posterior disc bulge. On the left, moderate left foraminal stenosis   with more compression of  the left exiting L3 nerve root from disc  protrusion. Slightly more severe left facet hypertrophy and  ligamentous hypertrophy as well.  Overall, grossly stable since last  MRI   L4-L5: Posterior disc bulge with mild central stenosis. AP central   canal measures 9 mm. More severe facet arthropathy with  moderate  right facet joint effusion which is new. No significant right  foraminal stenosis. Mild to moderate left foraminal stenosis with  mild impingement of the left exiting L4 nerve root from  posterior   disc bulge. Facet joint effusion also present on the left.  Slightly  more severe than before.   L5-S1: Left posterolateral disc protrusion, indenting thecal sac, likely S1 root on the left. Facet hypertrophy also present. Severe   bilateral foraminal stenosis with impingement of bilateral L5   exiting nerve root.  Slightly more severe than before   IMPRESSION: Stable to mild progression of left-sided disease in the  lower  lumbar levels as above.     Past Medical History:      Past Medical History:   Diagnosis Date    Anemia     Asthma     Colon polyp     DDD (degenerative disc disease) 02/15/2009    Depression 12/18/2011    DVT (deep venous thrombosis) ([facility]) 10/23/2009    Left leg:  No DVT.      Essential hypertension, benign     History of echocardiogram 03/20/2004    EF 70%.  Borderline DDfx.  No significant valvular pathology.    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  Neg EKG on pharm stress test.    Thyroid disease     hypothyroidism      Past Surgical History:   Procedure Laterality Date    COLONOSCOPY N/A 04/25/2020    SCREENING COLONOSCOPY performed by Sloan Leiter, MD at [facility] ENDOSCOPY     COLONOSCOPY  02-24-12    normal (Dr. Angelique Holm)    COLONOSCOPY FLX DX W/COLLJ [facility] WHEN P[facility]  02-2007    +polyp(tubular adenoma); Dr Angelique Holm    HERNIA REPAIR      3x    HYSTERECTOMY (CERVIX STATUS UNKNOWN)      NEUROLOGICAL SURGERY  03-16-2009    s/p ACD & Fusion (Dr.P.Gurtner)    TUBAL LIGATION        Social History     Tobacco Use   Smoking Status Former    Packs/day: 1.00    Types: Cigarettes    Start date: 10/16/1977    Quit date: 04/25/2004    Years since quitting: 17.9   Smokeless Tobacco Never      Current Outpatient Medications   Medication Sig Dispense Refill    ZINC PO Take 1 tablet by mouth daily      albuterol sulfate HFA (PROVENTIL;VENTOLIN;PROAIR) 108 (90 Base) MCG/ACT inhaler Inhale 2 puffs into the lungs every 4 hours as needed      atorvastatin (LIPITOR) 40 MG tablet TAKE  1  TABLET  EVERY  DAY (NEED MD APPOINTMENT)      vitamin D3 (CHOLECALCIFEROL) 125 MCG (5000 UT) TABS tablet Take 1 tablet by mouth daily      fluticasone (FLONASE) 50 MCG/ACT nasal spray 2 sprays by Nasal route daily as needed      fluticasone-umeclidin-vilant (TRELEGY ELLIPTA) 200-62.5-25 MCG/ACT AEPB inhaler Inhale 1 puff into the lungs daily      gabapentin (NEURONTIN) 300 MG capsule TAKE 1 CAPSULE EVERY MORNING AND TAKE 2 CAPSULES EVERY NIGHT      iron dextran complex (INFED) 50 MG/ML injection Infuse 19.5 mLs intravenously      latanoprost (XALATAN) 0.005 % ophthalmic solution Apply 1 drop to eye      levothyroxine (SYNTHROID) 150 MCG tablet TAKE 1 TABLET EVERY DAY BEFORE BREAKFAST (NEED MD APPOINTMENT)      lidocaine (LIDODERM) 5 % Place 1 patch onto the skin every 24 hours      montelukast (SINGULAIR) 10 MG tablet Take 1 tablet by mouth daily      pantoprazole (PROTONIX) 40 MG tablet Take 1 tablet by mouth daily      potassium chloride (KLOR-CON M) 10 MEQ extended release tablet TAKE 1 TABLET EVERY DAY      valsartan-hydroCHLOROthiazide (DIOVAN-HCT) 160-25 MG per tablet TAKE 1 TABLET EVERY DAY       No current  facility-administered medications for this visit.      Allergies   Allergen Reactions    Aspirin Other (See Comments)     GI distress,     Diphenhydramine Other (See Comments)     Muscle jerking    Pollen Extract      Other reaction(s): Not Reported This Time     Physical Exam      General: ??????? Well nourished and well developed female without any acute distress    Psychiatric: ?  Alert and oriented x 3 with normal mood     HEENT: ????????  Atraumatic    Respiratory:   Breathing non-labored and non dyspneic  CV: ???????????????? Peripheral pulses intact, no peripheral edema    Skin: ?????????????  No rashes          Neurologic: ??        Sensation: normal and grossly intact thebilateral, lower extremity(s)    Strength: 5/5 in the bilateral, lower extremity(s) - 4/5 strength left hip flexion -no pain   Reflexes: reveals 2+ symmetric DTRs throughout     Gait: normal       Musculoskeletal: Lumbar Exam       Inspection:    Alignment: Normal   Atrophy: None          Tenderness to Palpation:    Lumbar paraspinals Positive   Lumbar spinous processes Negative   SI Joint:  Negative   Gluteal:Negative   Greater trochanter: Positive b/l         ROM:    Lumbar ROM: Normal   Lumbar facet loading: Positive   Hip ROM: No reproduction of pain with movement seated      Special Tests        Slump test: Negative    SLR: Negative   FABER: Negative   FADIR: Negative   Log Roll: Negative         Medical Decision Making:     Images: The imaging results    Labs:  The results below were reviewed.       X-ray left hip reviewed as above- mild hip OA         Assessment:    -lumbar spondylosis/- lumbar facet arthritis   - left hip pain- vs left lumbar radiculitis            Plan:        -Physical therapy - She would like to try water aerobics and is going to try the [employer]   -Medications - lidocaine patch. Counseled regarding side effects and appropriate administration of medications.     -Diagnostics/Imaging -    -Injections  -Discussed repeating hip injection but she is scheduled to have replacement surgery in July so will hold off for now   -Lifestyle - Encouraged regular aerobic exercise    -Education - The patient's diagnosis, prognosis and treatment options were discussed today. All questions were answered.    F/U - f/u in 4 months          [facility]             ?

## 2022-03-24 LAB — COMPREHENSIVE METABOLIC PANEL
ALT: 11 IU/L (ref 0–32)
AST: 17 IU/L (ref 0–40)
Albumin/Globulin Ratio: 1.8 (ref 1.2–2.2)
Albumin: 4.2 g/dL (ref 3.7–4.7)
Alkaline Phosphatase: 84 IU/L (ref 44–121)
BUN/Creatinine Ratio: 9 — ABNORMAL LOW (ref 12–28)
BUN: 6 mg/dL — ABNORMAL LOW (ref 8–27)
CO2: 26 mmol/L (ref 20–29)
Calcium: 9.4 mg/dL (ref 8.7–10.3)
Chloride: 100 mmol/L (ref 96–106)
Creatinine: 0.67 mg/dL (ref 0.57–1.00)
Est, Glom Filt Rate: 93 mL/min/{1.73_m2} (ref >59)
Globulin, Total: 2.3 g/dL (ref 1.5–4.5)
Glucose: 150 mg/dL — ABNORMAL HIGH (ref 70–99)
Potassium: 3.4 mmol/L — ABNORMAL LOW (ref 3.5–5.2)
Sodium: 142 mmol/L (ref 134–144)
Total Bilirubin: 0.4 mg/dL (ref 0.0–1.2)
Total Protein: 6.5 g/dL (ref 6.0–8.5)

## 2022-03-24 LAB — LIPID PANEL
Cholesterol: 170 mg/dL (ref 100–199)
HDL: 54 mg/dL (ref >39)
LDL Calculated: 97 mg/dL (ref 0–99)
Triglycerides: 102 mg/dL (ref 0–149)
VLDL Cholesterol Calculated: 19 mg/dL (ref 5–40)

## 2022-03-24 LAB — VITAMIN D 25 HYDROXY: Vit D, 25-Hydroxy: 87.9 ng/mL (ref 30.0–100.0)

## 2022-03-24 LAB — TSH: TSH: 7.65 u[IU]/mL — ABNORMAL HIGH (ref 0.450–4.500)

## 2022-03-24 LAB — SPECIMEN STATUS REPORT

## 2022-03-30 ENCOUNTER — Encounter: Attending: Internal Medicine | Primary: Internal Medicine

## 2022-03-30 ENCOUNTER — Ambulatory Visit
Admit: 2022-03-30 | Discharge: 2022-03-30 | Payer: PRIVATE HEALTH INSURANCE | Attending: Internal Medicine | Primary: Internal Medicine

## 2022-03-30 DIAGNOSIS — R7303 Prediabetes: Secondary | ICD-10-CM

## 2022-03-30 DIAGNOSIS — I10 Essential (primary) hypertension: Secondary | ICD-10-CM

## 2022-03-30 MED ORDER — MONTELUKAST SODIUM 10 MG PO TABS
10 | ORAL_TABLET | Freq: Every day | ORAL | 2 refills | 90.00000 days | Status: DC
Start: 2022-03-30 — End: 2022-06-19

## 2022-03-30 NOTE — Progress Notes (Signed)
Elaine [name] presents today for   Chief Complaint   Patient presents with    Hypertension    Cholesterol Problem     4 month follow up     Letter     Work note stating patient came in for appointment.      1. "Have you been to the ER, urgent care clinic since your last visit?  Hospitalized since your last visit?" no    2. "Have you seen or consulted any other health care providers outside of the [facility] since your last visit?" no     3. For patients aged 12-75: Has the patient had a colonoscopy / FIT/ Cologuard? Yes - no Care Gap present      If the patient is female:    4. For patients aged 69-74: Has the patient had a mammogram within the past 2 years? Yes - no Care Gap present      5. For patients aged 21-65: Has the patient had a pap smear? NA - based on age or sex

## 2022-04-04 NOTE — Progress Notes (Signed)
Elaine [name] is a 71 y.o.  female and presents with Hypertension, Cholesterol Problem, Thyroid Problem (/), and Vitamin D Deficiency      SUBJECTIVE:  [name] HTN is well controlled on   DiovanHct 160/25. Does not appear to be on  Coreg 6.25 mg BID   [name] high cholesterol has been borderline controlled on Lipitor 40 mg. Pt  denies any side effects like myalgias. Compliance with medications has been an issue.  Pt has been controlling her prediabetes with diet and weight loss.      Thyroid Review:  Patient is seen for followup of hypothyroidism.   Thyroid ROS: denies fatigue, weight changes, heat/cold intolerance, bowel/skin changes or CVS symptoms. 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  Her TSH  is high on Synthroid 150 mcg daily due to pt missing doses.  She will try to take consistently daily.    [name] vit D level is high so she will decrease from 2000 to 100 units/day. She is not sure about her exact dose  Patient has sleep apnea and is using a CPAP machine and followed by sleep specialist    She continues to have fungal infection in her toenails which is slowly improving with Lamisil.     Patient has asthma that is controlled on Singulair and  trelegy Ellipta.  Patient is followed by pulmonary who also sees her for sleep apnea.      Patient will continue to work on trying to lose weight and decrease her BMI from 34 with cutting back starches and sweets.    Patient has chronic diastolic heart failure which is stable and followed by cardiology    Respiratory ROS: negative for - shortness of breath  Cardiovascular ROS: negative for - chest pain      Current Outpatient Medications   Medication Sig    montelukast (SINGULAIR) 10 MG tablet Take 1 tablet by mouth daily    ZINC PO Take 1 tablet by mouth daily    albuterol sulfate HFA (PROVENTIL;VENTOLIN;PROAIR) 108 (90 Base) MCG/ACT inhaler Inhale 2 puffs into the lungs every  4 hours as needed    atorvastatin (LIPITOR) 40 MG tablet TAKE  1  TABLET  EVERY  DAY (NEED MD APPOINTMENT)    vitamin D3 (CHOLECALCIFEROL) 125 MCG (5000 UT) TABS tablet Take 1 tablet by mouth daily    fluticasone (FLONASE) 50 MCG/ACT nasal spray 2 sprays by Nasal route daily as needed    fluticasone-umeclidin-vilant (TRELEGY ELLIPTA) 200-62.5-25 MCG/ACT AEPB inhaler Inhale 1 puff into the lungs daily    gabapentin (NEURONTIN) 300 MG capsule TAKE 1 CAPSULE EVERY MORNING AND TAKE 2 CAPSULES EVERY NIGHT    iron dextran complex (INFED) 50 MG/ML injection Infuse 19.5 mLs intravenously    latanoprost (XALATAN) 0.005 % ophthalmic solution Apply 1 drop to eye    levothyroxine (SYNTHROID) 150 MCG tablet TAKE 1 TABLET EVERY DAY BEFORE BREAKFAST (NEED MD APPOINTMENT)    lidocaine (LIDODERM) 5 % Place 1 patch onto the skin every 24 hours    potassium chloride (KLOR-CON M) 10 MEQ extended release tablet TAKE 1 TABLET EVERY DAY    valsartan-hydroCHLOROthiazide (DIOVAN-HCT) 160-25 MG per tablet TAKE 1 TABLET EVERY DAY    pantoprazole (PROTONIX) 40 MG tablet Take 1 tablet by mouth daily (Patient not taking: Reported on 03/30/2022)     No current facility-administered medications for this visit.  OBJECTIVE:  alert, well appearing, and in no distress    Visit Vitals  BP 106/60 (Site: Left Upper Arm, Position: Sitting, Cuff Size: Large Adult)   Pulse 89   Temp 98.1 F (36.7 C) (Temporal)   Resp 20   Ht '5\' 2"'$  (1.575 m)   Wt 191 lb (86.6 kg)   SpO2 95%   BMI 34.93 kg/m          well developed and well nourished  Chest - clear to auscultation, no wheezes, rales or rhonchi, symmetric air entry  Heart - normal rate, regular rhythm, normal S1, S2, no murmurs, rubs, clicks or gallops  Extremities - peripheral pulses normal, no pedal edema, no clubbing or cyanosis    CMP:    Lab Results   Component Value Date/Time    NA 142 03/23/2022 12:00 AM    K 3.4 03/23/2022 12:00 AM    CL 100 03/23/2022 12:00 AM    CO2 26 03/23/2022 12:00 AM     BUN 6 03/23/2022 12:00 AM    CREATININE 0.67 03/23/2022 12:00 AM    GFRAA >60 05/26/2021 08:05 AM    AGRATIO 1.8 03/23/2022 12:00 AM    LABGLOM 93 03/23/2022 12:00 AM    GLUCOSE 150 03/23/2022 12:00 AM    PROT 6.5 03/23/2022 12:00 AM    LABALBU 4.2 03/23/2022 12:00 AM    CALCIUM 9.4 03/23/2022 12:00 AM    BILITOT 0.4 03/23/2022 12:00 AM    ALKPHOS 84 03/23/2022 12:00 AM    AST 17 03/23/2022 12:00 AM    ALT 11 03/23/2022 12:00 AM     HgBA1c:    Lab Results   Component Value Date/Time    LABA1C 5.5 11/17/2021 03:46 AM     FLP:    Lab Results   Component Value Date/Time    TRIG 102 03/23/2022 12:00 AM    HDL 54 03/23/2022 12:00 AM    LDLCALC 97 03/23/2022 12:00 AM    LABVLDL 19 03/23/2022 12:00 AM     TSH:    Lab Results   Component Value Date/Time    TSH 7.650 03/23/2022 12:00 AM         Discussed the patient's BMI with her.  The BMI follow up plan is as follows: I have counseled this patient on diet and exercise regimens.        Assessment/Plan    ICD-10-CM    1. Essential (primary) hypertension  I10 Well controlled on DiovanHct. Not sure if she is still on Coreg Comprehensive Metabolic Panel      2. Pure hypercholesterolemia, unspecified  E78.00 Fairly well controlled on Lipitor 40 mg Lipid Panel      3. Postprocedural hypothyroidism  E89.0 Uncontrolled. Pt to take her synthroid 150 mcg/day consistently       4. Chronic diastolic (congestive) heart failure (HCC)  I50.32 Stable on Current meds. Need to confirm if she is still on Coreg        5. Vitamin D deficiency, unspecified  E55.9 On high side. Pt to supplement in half from what she is taking Vitamin D 25 Hydroxy      6. Prediabetes  R73.03 Well controlled with diet Hemoglobin A1C           Follow-up and Dispositions    Ms.Return in about 6 months (around 09/29/2022) for labs 1 week before.             Reviewed plan of care. Patient has provided input and agrees with  goals.

## 2022-04-07 NOTE — Telephone Encounter (Signed)
-----   Message from Lynnell Catalan sent at 04/07/2022  3:24 PM EDT -----  Subject: Message to Provider    QUESTIONS  Information for Provider? Patient has paper work that needs to be filled   out by May 15th 15th for work. Patient would like to know if she can fax   the paper work in? Please call back and advise asap.   ---------------------------------------------------------------------------  --------------  Rod Can INFO  [phone]; OK to leave message on voicemail  ---------------------------------------------------------------------------  --------------  SCRIPT ANSWERS  Relationship to Patient? Self

## 2022-04-07 NOTE — Telephone Encounter (Signed)
Patient states she will fax form in the morning.

## 2022-04-08 NOTE — Progress Notes (Signed)
Form received from [facility] for Additional Information Questionnaire via Fax at our [facility] location.    Blank form scanned into patient's chart.    Form sent to [place] location for completion.

## 2022-04-11 ENCOUNTER — Inpatient Hospital Stay: Admit: 2022-04-11 | Payer: MEDICARE | Primary: Internal Medicine

## 2022-04-11 DIAGNOSIS — Z1231 Encounter for screening mammogram for malignant neoplasm of breast: Secondary | ICD-10-CM

## 2022-04-13 ENCOUNTER — Encounter

## 2022-04-14 MED ORDER — GABAPENTIN 300 MG PO CAPS
300 | ORAL_CAPSULE | ORAL | 1 refills | Status: DC
Start: 2022-04-14 — End: 2022-11-24

## 2022-04-14 NOTE — Telephone Encounter (Signed)
[place] 04/14/2022     The last fill date 01/10/2022 Gabapentin 300 Mg Capsule for a 90 d/s qty 270      Last UDS: not on file     CSA Last signed: not on file         PCP: NOEL Vear Clock, MD    Last appt: 03/30/2022  Future Appointments   Date Time Provider Notus   04/20/2022 10:45 AM Merdis Delay, DO [facility] BS AMB   06/12/2022  2:00 PM Thana Farr, MD [facility] BS AMB   06/19/2022  9:00 AM Carlyle Dolly, MD [facility] BS AMB   06/22/2022  1:30 PM Anna-Christina Carren Rang, MD [facility] BS AMB   10/05/2022  4:00 PM Thana Farr, MD [facility] BS AMB       Requested Prescriptions     Pending Prescriptions Disp Refills    gabapentin (NEURONTIN) 300 MG capsule [Pharmacy Med Name: GABAPENTIN 300 MG Capsule] 270 capsule      Sig: TAKE 1 CAPSULE EVERY MORNING  AND TAKE 2 CAPSULES EVERY NIGHT

## 2022-04-20 ENCOUNTER — Ambulatory Visit
Admit: 2022-04-20 | Discharge: 2022-04-20 | Payer: MEDICARE | Attending: Orthopaedic Surgery | Primary: Internal Medicine

## 2022-04-20 VITALS — HR 93 | Resp 18 | Ht 62.0 in | Wt 186.8 lb

## 2022-04-20 DIAGNOSIS — M1612 Unilateral primary osteoarthritis, left hip: Secondary | ICD-10-CM

## 2022-04-20 NOTE — Assessment & Plan Note (Addendum)
71 year old female with left hip osteoarthritis.  She failed conservative treatment with Tylenol, ibuprofen and gabapentin.  She did get a left hip corticosteroid injection intra-articularly which gave her only a month of greater than 90% relief of her pain.  At this point would like to go forward with a left total hip replacement through the anterolateral approach.  I did discuss with her the risks and benefits of a direct anterior versus anterolateral approach and she did agree to go forward with anterolateral approach.  She has an overhanging pannus that reaches down the thigh quite a bit.  She is currently working on adjusting her thyroid medications to get her hypothyroidism under control.  Her other medical comorbidities under medication control at this time.  She is currently working in childcare and would like to go forward with surgery in July as her last day of work is the end of June.

## 2022-04-20 NOTE — Progress Notes (Signed)
Patient: Elaine [name]                MRN: [id number]       SSN: [id number]  Date of Birth: Jan 08, 1951        AGE: 71 y.o.        SEX: female    PCP: NOEL Vear Clock, MD  04/20/22    Chief Complaint: Hip Pain (Left hip )      HPI:  Elaine [name] is a 71 y.o. female with chief complaint of   Chief Complaint   Patient presents with    Hip Pain     Left hip        1. Primary osteoarthritis of left hip  Assessment & Plan:  71 year old female with left hip osteoarthritis.  She failed conservative treatment with Tylenol, ibuprofen and gabapentin.  She did get a left hip corticosteroid injection intra-articularly which gave her only a month of greater than 90% relief of her pain.  At this point would like to go forward with a left total hip replacement through the anterolateral approach.  I did discuss with her the risks and benefits of a direct anterior versus anterolateral approach and she did agree to go forward with anterolateral approach.  She has an overhanging pannus that reaches down the thigh quite a bit.  She is currently working on adjusting her thyroid medications to get her hypothyroidism under control.  Her other medical comorbidities under medication control at this time.  She is currently working in childcare and would like to go forward with surgery in July as her last day of work is the end of June.  Orders:  -     SCHEDULE SURGERY  2. Class 1 obesity with body mass index (BMI) of 34.0 to 34.9 in adult, unspecified obesity type, unspecified whether serious comorbidity present  3. Chronic diastolic congestive heart failure ([place])  4. Hypothyroidism, unspecified type  5. Pre-diabetes        AMB PAIN ASSESSMENT 04/20/2022   Location of Pain Hip   Location Modifiers Left   Severity of Pain 4   Quality of Pain -   Duration of Pain -   Frequency of Pain -   Aggravating Factors -   Limiting Behavior -   Relieving Factors -   Result of Injury -     Tobacco Use: Medium Risk    Smoking Tobacco Use: Former     Smokeless Tobacco Use: Never    Passive Exposure: Never           PHYSICAL EXAMINATION:  Pulse 93   Resp 18   Ht '5\' 2"'$  (1.575 m)   Wt 186 lb 12.8 oz (84.7 kg)   SpO2 93%   BMI 34.17 kg/m   Body mass index is 34.17 kg/m.  Wt Readings from Last 3 Encounters:   04/20/22 186 lb 12.8 oz (84.7 kg)   03/30/22 191 lb (86.6 kg)   03/23/22 188 lb (85.3 kg)       GENERAL: Alert and oriented x3, in no acute distress.  HEENT: Normocephalic, atraumatic.    MSK: Left Hip Exam     Tenderness   The patient is experiencing tenderness in the lateral.    Range of Motion   Flexion:  100   External rotation:  30   Internal rotation: 10     Muscle Strength   Abduction: 5/5   Adduction: 5/5   Flexion: 5/5     Other  Erythema: absent  Scars: absent  Sensation: normal  Pulse: present    Comments:  Skin over the left hip is clean dry and intact.  There is a large overhanging pannus which overlies the incision site for a direct anterior approach.           IMAGING:  Imaging read by myself and interpreted as follows:  3 view x-ray of the left hip including AP pelvis and AP and crosstable lateral of the left hip demonstrate significant joint space loss acetabular joint of approximately 90%.  There is osteophytosis,  subchondral sclerosis and subchondral cyst formation.          Past Medical History:   Diagnosis Date    Anemia     Asthma     Colon polyp     DDD (degenerative disc disease) 02/15/2009    Depression 12/18/2011    DVT (deep venous thrombosis) ([place]) 10/23/2009    Left leg:  No DVT.      Essential hypertension, benign     History of echocardiogram 03/20/2004    EF 70%.  Borderline DDfx.  No significant valvular pathology.    Hypercholesterolemia     Hypertension     Iron deficiency anemia 03/15/8118    Lichen planus 1/47/8295    Meralgia paraesthetica 10/22/2013    OA (osteoarthritis of spine) 02/15/2009    OA (osteoarthritis of spine) 02/15/2009    Obesity, unspecified     Other and unspecified hyperlipidemia     Pre-operative  cardiovascular examination     For spine surgery    Right sided sciatica     Shortness of breath     Possible asthma, HCVD; less likely CAD (Noted 03/15/09)    Sleep apnea 02/15/2009    uses cpap machine    Thallium stress test abnormal 03/20/2004    Partially transient, mod basal & mid anterior defect most c/w artifact; mild anterior ischemia less likely.  Neg EKG on pharm stress test.    Thyroid disease     hypothyroidism       Family History   Problem Relation Age of Onset    Hypertension Sister         x3    Hypertension Mother     Cancer Mother     Heart Surgery Sister     Heart Disease Sister     Hypertension Paternal Uncle     Cancer Maternal Aunt         breast    Breast Cancer Maternal Aunt     Glaucoma Son     Heart Defect Son     Diabetes Paternal Uncle     Heart Attack Sister        Current Outpatient Medications   Medication Sig Dispense Refill    gabapentin (NEURONTIN) 300 MG capsule TAKE 1 CAPSULE EVERY MORNING  AND TAKE 2 CAPSULES EVERY NIGHT 270 capsule 1    montelukast (SINGULAIR) 10 MG tablet Take 1 tablet by mouth daily 90 tablet 2    ZINC PO Take 1 tablet by mouth daily      albuterol sulfate HFA (PROVENTIL;VENTOLIN;PROAIR) 108 (90 Base) MCG/ACT inhaler Inhale 2 puffs into the lungs every 4 hours as needed      atorvastatin (LIPITOR) 40 MG tablet TAKE  1  TABLET  EVERY  DAY (NEED MD APPOINTMENT)      vitamin D3 (CHOLECALCIFEROL) 125 MCG (5000 UT) TABS tablet Take 1 tablet by mouth daily      fluticasone (  FLONASE) 50 MCG/ACT nasal spray 2 sprays by Nasal route daily as needed      fluticasone-umeclidin-vilant (TRELEGY ELLIPTA) 200-62.5-25 MCG/ACT AEPB inhaler Inhale 1 puff into the lungs daily      iron dextran complex (INFED) 50 MG/ML injection Infuse 19.5 mLs intravenously      latanoprost (XALATAN) 0.005 % ophthalmic solution Apply 1 drop to eye      levothyroxine (SYNTHROID) 150 MCG tablet TAKE 1 TABLET EVERY DAY BEFORE BREAKFAST (NEED MD APPOINTMENT)      lidocaine (LIDODERM) 5 % Place 1 patch  onto the skin every 24 hours      pantoprazole (PROTONIX) 40 MG tablet Take 1 tablet by mouth daily      potassium chloride (KLOR-CON M) 10 MEQ extended release tablet TAKE 1 TABLET EVERY DAY      valsartan-hydroCHLOROthiazide (DIOVAN-HCT) 160-25 MG per tablet TAKE 1 TABLET EVERY DAY       No current facility-administered medications for this visit.        Allergies   Allergen Reactions    Aspirin Other (See Comments)     GI distress,     Diphenhydramine Other (See Comments)     Muscle jerking    Pollen Extract      Other reaction(s): Not Reported This Time       Past Surgical History:   Procedure Laterality Date    COLONOSCOPY N/A 04/25/2020    SCREENING COLONOSCOPY performed by Sloan Leiter, MD at [facility] ENDOSCOPY    COLONOSCOPY  02-24-12    normal (Dr. Angelique Holm)    COLONOSCOPY FLX DX W/COLLJ [facility] WHEN PFRMD  02-2007    +polyp(tubular adenoma); Dr Angelique Holm    HERNIA REPAIR      3x    HYSTERECTOMY ([place])      NEUROLOGICAL SURGERY  03-16-2009    s/p ACD & Fusion (Dr.P.Gurtner)    TUBAL LIGATION         Social History     Socioeconomic History    Marital status: Divorced     Spouse name: Not on file    Number of children: Not on file    Years of education: Not on file    Highest education level: Not on file   Occupational History    Not on file   Tobacco Use    Smoking status: Former     Packs/day: 1.00     Years: 27.00     Pack years: 27.00     Types: Cigarettes     Start date: 10/16/1977     Quit date: 04/25/2004     Years since quitting: 17.9     Passive exposure: Never    Smokeless tobacco: Never   Substance and Sexual Activity    Alcohol use: No    Drug use: No    Sexual activity: Not on file   Other Topics Concern    Not on file   Social History Narrative    Not on file     Social Determinants of Health     Financial Resource Strain: Low Risk     Difficulty of Paying Living Expenses: Not hard at all   Food Insecurity: No Food Insecurity    Worried About Charity fundraiser in the Last Year: Never true     [place] in the Last Year: Never true   Transportation Needs: Unknown    Lack of Transportation (Medical): Not on file    Lack of Transportation (  Non-Medical): No   Physical Activity: Not on file   Stress: Not on file   Social Connections: Not on file   Intimate Partner Violence: Not on file   Housing Stability: Unknown    Unable to Pay for Housing in the Last Year: Not on file    Number of Places Lived in the Last Year: Not on file    Unstable Housing in the Last Year: No       REVIEW OF SYSTEMS:      No changes from previous review of systems unless noted.      Prescription medication management discussed with patient.     Electronically signed by: Merdis Delay, DO    Note: This note was completed using voice recognition software.  Any typographical/name errors or mistakes are unintentional.

## 2022-05-07 NOTE — Telephone Encounter (Signed)
Appt sched.

## 2022-05-07 NOTE — Telephone Encounter (Signed)
Will need OV/VV to get note. Can double book at 1 pm Friday

## 2022-05-07 NOTE — Telephone Encounter (Signed)
Patient called and stated that she fell on this past Tuesday at home and hurt her back and hip. Patient states she did not go to work that day ans have not returned to work since Tuesday. Have been taking tylenol and using the heating pad. Patient states she did not go to the er.  Also patient states shes getting ready to have a hip replacement in July. Requesting to get a return to work note for 5-30 thru thru 6-2 .will return to work on 05-11-22.

## 2022-05-08 ENCOUNTER — Ambulatory Visit: Payer: MEDICARE | Attending: Internal Medicine | Primary: Internal Medicine

## 2022-05-11 ENCOUNTER — Ambulatory Visit: Admit: 2022-05-11 | Discharge: 2022-05-11 | Payer: MEDICARE | Attending: Internal Medicine | Primary: Internal Medicine

## 2022-05-11 DIAGNOSIS — Z0000 Encounter for general adult medical examination without abnormal findings: Secondary | ICD-10-CM

## 2022-05-11 MED ORDER — PREDNISONE 10 MG (21) PO TBPK
10 | ORAL | 0 refills | Status: DC
Start: 2022-05-11 — End: 2022-06-19

## 2022-05-11 NOTE — Progress Notes (Addendum)
Elaine [name] presents today for   Chief Complaint   Patient presents with    Hip Pain     Left hip pain S/P fall     Patient is also is here for [name].    1. "Have you been to the ER, urgent care clinic since your last visit?  Hospitalized since your last visit?" no    2. "Have you seen or consulted any other health care providers outside of the [facility] since your last visit?" no     3. For patients aged 72-75: Has the patient had a colonoscopy / FIT/ Cologuard? Yes - no Care Gap present      If the patient is female:    4. For patients aged 13-74: Has the patient had a mammogram within the past 2 years? Yes - no Care Gap present      5. For patients aged 21-65: Has the patient had a pap smear? NA - based on age or sex

## 2022-05-11 NOTE — Progress Notes (Unsigned)
Medicare Annual Wellness Visit    Elaine [name] is here for Hip Pain (Left hip pain S/P fall/) and Medicare AWV    Assessment & Plan   Medicare annual wellness visit, subsequent  Recommendations for Preventive Services Due: see orders and patient instructions/AVS.  Recommended screening schedule for the next 5-10 years is provided to the patient in written form: see Patient Instructions/AVS.     No follow-ups on file.     Subjective   {OPTIONAL - WILL AUTO-DELETE IF NOT NWGN:[id number]}    Patient's complete Health Risk Assessment and screening values have been reviewed and are found in Flowsheets. The following problems were reviewed today and where indicated follow up appointments were made and/or referrals ordered.    Positive Risk Factor Screenings with Interventions:    Fall Risk:  Do you feel unsteady or are you worried about falling? : (!) yes  2 or more falls in past year?: (!) yes  Fall with injury in past year?: no     Interventions:    {Fall Interventions:201460083}              Weight and Activity:  Physical Activity: Inactive    Days of Exercise per Week: 0 days    Minutes of Exercise per Session: 0 min     On average, how many days per week do you engage in moderate to strenuous exercise (like a brisk walk)?: 0 days  Have you lost any weight without trying in the past 3 months?: No  Body mass index is 34.2 kg/m. (!) Abnormal    Inactivity Interventions:  {Inactivity Interventions:201460115}  Obesity Interventions:  {Obesity Interventions:201460099}    {Optional - Use if Billing for Obesity [phone]}           Advanced Directives:  Do you have a Living Will?: (!) No    Intervention:  {AWV ADVANCED [phone]}    {OPTIONAL - only use if billing for counseling:[phone]}     {OPTIONAL- LDCT, CVD, STI Counseling Statements:[phone]}              Objective   Vitals:    05/11/22 1332   BP: 123/83   Site: Left Upper Arm   Position: Sitting   Cuff Size: Large Adult   Pulse: 98    Resp: 20   Temp: 98.1 F (36.7 C)   TempSrc: Temporal   SpO2: 98%   Weight: 187 lb (84.8 kg)   Height: 5\' 2"  (1.575 m)      Body mass index is 34.2 kg/m.        {OPTIONAL - GENERAL PHYSICAL EXAM (WILL AUTO-DELETE IF NOT QION):[id number]}       Allergies   Allergen Reactions    Aspirin Other (See Comments)     GI distress,     Diphenhydramine Other (See Comments)     Muscle jerking    Pollen Extract      Other reaction(s): Not Reported This Time     Prior to Visit Medications    Medication Sig Taking? Authorizing Provider   gabapentin (NEURONTIN) 300 MG capsule TAKE 1 CAPSULE EVERY MORNING  AND TAKE 2 CAPSULES EVERY NIGHT Yes Marvia Pickles, MD   montelukast (SINGULAIR) 10 MG tablet Take 1 tablet by mouth daily Yes Marvia Pickles, MD   ZINC PO Take 1 tablet by mouth daily Yes Ar Automatic Reconciliation   albuterol sulfate HFA (PROVENTIL;VENTOLIN;PROAIR) 108 (90 Base) MCG/ACT inhaler Inhale 2 puffs into the lungs every 4 hours as needed  Yes Ar Automatic Reconciliation   atorvastatin (LIPITOR) 40 MG tablet TAKE  1  TABLET  EVERY  DAY (NEED MD APPOINTMENT) Yes Ar Automatic Reconciliation   vitamin D3 (CHOLECALCIFEROL) 125 MCG (5000 UT) TABS tablet Take 1 tablet by mouth daily Yes Ar Automatic Reconciliation   fluticasone (FLONASE) 50 MCG/ACT nasal spray 2 sprays by Nasal route daily as needed Yes Ar Automatic Reconciliation   fluticasone-umeclidin-vilant (TRELEGY ELLIPTA) 200-62.5-25 MCG/ACT AEPB inhaler Inhale 1 puff into the lungs daily Yes Ar Automatic Reconciliation   iron dextran complex (INFED) 50 MG/ML injection Infuse 19.5 mLs intravenously Yes Ar Automatic Reconciliation   latanoprost (XALATAN) 0.005 % ophthalmic solution Apply 1 drop to eye Yes Ar Automatic Reconciliation   levothyroxine (SYNTHROID) 150 MCG tablet TAKE 1 TABLET EVERY DAY BEFORE BREAKFAST (NEED MD APPOINTMENT) Yes Ar Automatic Reconciliation   lidocaine (LIDODERM) 5 % Place 1 patch onto the skin every 24 hours Yes Ar Automatic Reconciliation    pantoprazole (PROTONIX) 40 MG tablet Take 1 tablet by mouth daily Yes Ar Automatic Reconciliation   potassium chloride (KLOR-CON M) 10 MEQ extended release tablet TAKE 1 TABLET EVERY DAY Yes Ar Automatic Reconciliation   valsartan-hydroCHLOROthiazide (DIOVAN-HCT) 160-25 MG per tablet TAKE 1 TABLET EVERY DAY Yes Ar Automatic Reconciliation       CareTeam (Including outside providers/suppliers regularly involved in providing care):   Patient Care Team:  Marvia Pickles, MD as PCP - General  Marvia Pickles, MD as PCP - Empaneled Provider     Reviewed and updated this visit:  Tobacco  Allergies  Meds  Problems  Med Hx  Surg Hx  Soc Hx  Fam Hx

## 2022-05-11 NOTE — Patient Instructions (Signed)
Preventing Falls: Care Instructions  Overview     Getting around your home safely can be a challenge if you have injuries or health problems that make it easy for you to fall. Loose rugs and furniture in walkways are among the dangers for many older people who have problems walking or who have poor eyesight. People who have conditions such as arthritis, osteoporosis, or dementia also have to be careful not to fall.  You can make your home safer with a few simple measures.  Follow-up care is a key part of your treatment and safety. Be sure to make and go to all appointments, and call your doctor if you are having problems. It's also a good idea to know your test results and keep a list of the medicines you take.  How can you care for yourself at home?  Taking care of yourself  Exercise regularly to improve your strength, muscle tone, and balance. Walk if you can. Swimming may be a good choice if you cannot walk easily.  Have your vision and hearing checked each year or any time you notice a change. If you have trouble seeing and hearing, you might not be able to avoid objects and could lose your balance.  Know the side effects of the medicines you take. Ask your doctor or pharmacist whether the medicines you take can affect your balance. Sleeping pills or sedatives can affect your balance.  Limit the amount of alcohol you drink. Alcohol can impair your balance and other senses.  Ask your doctor whether calluses or corns on your feet need to be removed. If you wear loose-fitting shoes because of calluses or corns, you can lose your balance and fall.  Talk to your doctor if you have numbness in your feet.  You may get dizzy if you do not drink enough water. To prevent dehydration, drink plenty of fluids. Choose water and other clear liquids. If you have kidney, heart, or liver disease and have to limit fluids, talk with your doctor before you increase the amount of fluids you drink.  Preventing falls at  home  Remove raised doorway thresholds, throw rugs, and clutter. Repair loose carpet or raised areas in the floor.  Move furniture and electrical cords to keep them out of walking paths.  Use nonskid floor wax, and wipe up spills right away, especially on ceramic tile floors.  If you use a walker or cane, put rubber tips on it. If you use crutches, clean the bottoms of them regularly with an abrasive pad, such as steel wool.  Keep your house well lit, especially stairways, porches, and outside walkways. Use night-lights in areas such as hallways and bathrooms. Add extra light switches or use remote switches (such as switches that go on or off when you clap your hands) to make it easier to turn lights on if you have to get up during the night.  Install sturdy handrails on stairways.  Move items in your cabinets so that the things you use a lot are on the lower shelves (about waist level).  Keep a cordless phone and a flashlight with new batteries by your bed. If possible, put a phone in each of the main rooms of your house, or carry a cell phone in case you fall and cannot reach a phone. Or, you can wear a device around your neck or wrist. You push a button that sends a signal for help.  Wear low-heeled shoes that fit well and give your feet  good support. Use footwear with nonskid soles. Check the heels and soles of your shoes for wear. Repair or replace worn heels or soles.  Do not wear socks without shoes on smooth floors, such as wood.  Walk on the grass when the sidewalks are slippery. If you live in an area that gets snow and ice in the winter, sprinkle salt on slippery steps and sidewalks. Or ask a family member or friend to do this for you.  Preventing falls in the bath  Install grab bars and nonskid mats inside and outside your shower or tub and near the toilet and sinks.  Use shower chairs and bath benches.  Use a hand-held shower head that will allow you to sit while showering.  Get into a tub or shower by  putting the weaker leg in first. Get out of a tub or shower with your strong side first.  Repair loose toilet seats and consider installing a raised toilet seat to make getting on and off the toilet easier.  Keep your bathroom door unlocked while you are in the shower.  Where can you learn more?  Go to https://www.bennett.info/ and enter G117 to learn more about "Preventing Falls: Care Instructions."  Current as of: November 9, 2022Content Version: 13.6   2006-2023 [employer], Incorporated.   Care instructions adapted under license by [facility]. If you have questions about a medical condition or this instruction, always ask your healthcare professional. [place] any warranty or liability for your use of this information.           Learning About Being Active as an Older Adult  Why is being active important as you get older?     Being active is one of the best things you can do for your health. And it's never too late to start. Being active--or getting active, if you aren't already--has definite benefits. It can:  Give you more energy,  Keep your mind sharp.  Improve balance to reduce your risk of falls.  Help you manage chronic illness with fewer medicines.  No matter how old you are, how fit you are, or what health problems you have, there is a form of activity that will work for you. And the more physical activity you can do, the better your overall health will be.  What kinds of activity can help you stay healthy?  Being more active will make your daily activities easier. Physical activity includes planned exercise and things you do in daily life. There are four types of activity:  Aerobic.  Doing aerobic activity makes your heart and lungs strong.  Includes walking, dancing, and gardening.  Aim for at least 2 hours spread throughout the week.  It improves your energy and can help you sleep better.  Muscle-strengthening.  This type of activity can help  maintain muscle and strengthen bones.  Includes climbing stairs, using resistance bands, and lifting or carrying heavy loads.  Aim for at least twice a week.  It can help protect the knees and other joints.  Stretching.  Stretching gives you better range of motion in joints and muscles.  Includes upper arm stretches, calf stretches, and gentle yoga.  Aim for at least twice a week, preferably after your muscles are warmed up from other activities.  It can help you function better in daily life.  Balancing.  This helps you stay coordinated and have good posture.  Includes heel-to-toe walking, tai chi, and certain types of yoga.  Aim for at  least 3 days a week.  It can reduce your risk of falling.  Even if you have a hard time meeting the recommendations, it's better to be more active than less active. All activity done in each category counts toward your weekly total. You'd be surprised how daily things like carrying groceries, keeping up with grandchildren, and taking the stairs can add up.  What keeps you from being active?  If you've had a hard time being more active, you're not alone. Maybe you remember being able to do more. Or maybe you've never thought of yourself as being active. It's frustrating when you can't do the things you want. Being more active can help. What's holding you back?  Getting started.  Have a goal, but break it into easy tasks. Small steps build into big accomplishments.  Staying motivated.  If you feel like skipping your activity, remember your goal. Maybe you want to move better and stay independent. Every activity gets you one step closer.  Not feeling your best.  Start with 5 minutes of an activity you enjoy. Prove to yourself you can do it. As you get comfortable, increase your time.  You may not be where you want to be. But you're in the process of getting there. Everyone starts somewhere.  How can you find safe ways to stay active?  Talk with your doctor about any physical challenges  you're facing. Make a plan with your doctor if you have a health problem or aren't sure how to get started with activity.  If you're already active, ask your doctor if there is anything you should change to stay safe as your body and health change.  If you tend to feel dizzy after you take medicine, avoid activity at that time. Try being active before you take your medicine. This will reduce your risk of falls.  If you plan to be active at home, make sure to clear your space before you get started. Remove things like TV cords, coffee tables, and throw rugs. It's safest to have plenty of space to move freely.  The key to getting more active is to take it slow and steady. Try to improve only a little bit at a time. Pick just one area to improve on at first. And if an activity hurts, stop and talk to your doctor.  Where can you learn more?  Go to https://www.bennett.info/ and enter P600 to learn more about "Learning About Being Active as an Older Adult."  Current as of: October 10, 2022Content Version: 13.6   2006-2023 Healthwise, Incorporated.   Care instructions adapted under license by [facility]. If you have questions about a medical condition or this instruction, always ask your healthcare professional. [place] any warranty or liability for your use of this information.           Advance Directives: Care Instructions  Overview  An advance directive is a legal way to state your wishes at the end of your life. It tells your family and your doctor what to do if you can't say what you want.  There are two main types of advance directives. You can change them any time your wishes change.  Living will.  This form tells your family and your doctor your wishes about life support and other treatment. The form is also called a declaration.  Medical power of attorney.  This form lets you name a person to make treatment decisions for you when you can't speak for  yourself. This person is called a health care agent (health care proxy, health care surrogate). The form is also called a durable power of attorney for health care.  If you do not have an advance directive, decisions about your medical care may be made by a family member, or by a doctor or a judge who doesn't know you.  It may help to think of an advance directive as a gift to the people who care for you. If you have one, they won't have to make tough decisions by themselves.  For more information, including forms for your state, see the [place] website ([email or web address].hu).  Follow-up care is a key part of your treatment and safety. Be sure to make and go to all appointments, and call your doctor if you are having problems. It's also a good idea to know your test results and keep a list of the medicines you take.  What should you include in an advance directive?  Many states have a unique advance directive form. (It may ask you to address specific issues.) Or you might use a universal form that's approved by many states.  If your form doesn't tell you what to address, it may be hard to know what to include in your advance directive. Use the questions below to help you get started.  Who do you want to make decisions about your medical care if you are not able to?  What life-support measures do you want if you have a serious illness that gets worse over time or can't be cured?  What are you most afraid of that might happen? (Maybe you're afraid of having pain, losing your independence, or being kept alive by machines.)  Where would you prefer to die? (Your home? A hospital? A nursing home?)  Do you want to donate your organs when you die?  Do you want certain religious practices performed before you die?  When should you call for help?  Be sure to contact your doctor if you have any questions.  Where can you learn more?  Go to https://www.bennett.info/ and enter R264 to  learn more about "Advance Directives: Care Instructions."  Current as of: June 16, 2022Content Version: 13.6   2006-2023 Healthwise, Incorporated.   Care instructions adapted under license by [facility]. If you have questions about a medical condition or this instruction, always ask your healthcare professional. [place] any warranty or liability for your use of this information.           Starting a Weight Loss Plan: Care Instructions  Overview     If you're thinking about losing weight, it can be hard to know where to start. Your doctor can help you set up a weight loss plan that best meets your needs. You may want to take a class on nutrition or exercise, or you could join a weight loss support group. If you have questions about how to make changes to your eating or exercise habits, ask your doctor about seeing a registered dietitian or an exercise specialist.  It can be a big challenge to lose weight. But you don't have to make huge changes at once. Make small changes, and stick with them. When those changes become habit, add a few more changes.  If you don't think you're ready to make changes right now, try to pick a date in the future. Make an appointment to see your doctor to discuss whether the time is right for you to start a plan.  Follow-up care is a key part of your treatment and safety. Be sure to make and go to all appointments, and call your doctor if you are having problems. It's also a good idea to know your test results and keep a list of the medicines you take.  How can you care for yourself at home?  Set realistic goals. Many people expect to lose much more weight than is likely. A weight loss of 5% to 10% of your body weight may be enough to improve your health.  Get family and friends involved to provide support. Talk to them about why you are trying to lose weight, and ask them to help. They can help by participating in exercise and having meals with  you, even if they may be eating something different.  Find what works best for you. If you do not have time or do not like to cook, a program that offers meal replacement bars or shakes may be better for you. Or if you like to prepare meals, finding a plan that includes daily menus and recipes may be best.  Ask your doctor about other health professionals who can help you achieve your weight loss goals.  A dietitian can help you make healthy changes in your diet.  An exercise specialist or personal trainer can help you develop a safe and effective exercise program.  A counselor or psychiatrist can help you cope with issues such as depression, anxiety, or family problems that can make it hard to focus on weight loss.  Consider joining a support group for people who are trying to lose weight. Your doctor can suggest groups in your area.  Where can you learn more?  Go to https://www.bennett.info/ and enter U357 to learn more about "Starting a Weight Loss Plan: Care Instructions."  Current as of: May 9, 2022Content Version: 13.6   2006-2023 Healthwise, Incorporated.   Care instructions adapted under license by [facility]. If you have questions about a medical condition or this instruction, always ask your healthcare professional. [place] any warranty or liability for your use of this information.           A Healthy Heart: Care Instructions  Your Care Instructions     Coronary artery disease, also called heart disease, occurs when a substance called plaque builds up in the vessels that supply oxygen-rich blood to your heart muscle. This can narrow the blood vessels and reduce blood flow. A heart attack happens when blood flow is completely blocked. A high-fat diet, smoking, and other factors increase the risk of heart disease.  Your doctor has found that you have a chance of having heart disease. You can do lots of things to keep your heart healthy. It may not be  easy, but you can change your diet, exercise more, and quit smoking. These steps really work to lower your chance of heart disease.  Follow-up care is a key part of your treatment and safety. Be sure to make and go to all appointments, and call your doctor if you are having problems. It's also a good idea to know your test results and keep a list of the medicines you take.  How can you care for yourself at home?  Diet   Use less salt when you cook and eat. This helps lower your blood pressure. Taste food before salting. Add only a little salt when you think you need it. With time, your taste buds will adjust to less salt.  Eat fewer snack items, fast foods, canned soups, and other high-salt, high-fat, processed foods.    Read food labels and try to avoid saturated and trans fats. They increase your risk of heart disease by raising cholesterol levels.    Limit the amount of solid fat-butter, margarine, and shortening-you eat. Use olive, peanut, or canola oil when you cook. Bake, broil, and steam foods instead of frying them.    Eat a variety of fruit and vegetables every day. Dark green, deep orange, red, or yellow fruits and vegetables are especially good for you. Examples include spinach, carrots, peaches, and berries.    Foods high in fiber can reduce your cholesterol and provide important vitamins and minerals. High-fiber foods include whole-grain cereals and breads, oatmeal, beans, brown rice, citrus fruits, and apples.    Eat lean proteins. Heart-healthy proteins include seafood, lean meats and poultry, eggs, beans, peas, nuts, seeds, and soy products.    Limit drinks and foods with added sugar. These include candy, desserts, and soda pop.   Lifestyle changes   If your doctor recommends it, get more exercise. Walking is a good choice. Bit by bit, increase the amount you walk every day. Try for at least 30 minutes on most days of the week. You also may want to swim, bike, or do other activities.     Do not smoke. If you need help quitting, talk to your doctor about stop-smoking programs and medicines. These can increase your chances of quitting for good. Quitting smoking may be the most important step you can take to protect your heart. It is never too late to quit.    Limit alcohol to 2 drinks a day for men and 1 drink a day for women. Too much alcohol can cause health problems.    Manage other health problems such as diabetes, high blood pressure, and high cholesterol. If you think you may have a problem with alcohol or drug use, talk to your doctor.   Medicines   Take your medicines exactly as prescribed. Call your doctor if you think you are having a problem with your medicine.    If your doctor recommends aspirin, take the amount directed each day. Make sure you take aspirin and not another kind of pain reliever, such as acetaminophen (Tylenol).   When should you call for help?   Call 911 if you have symptoms of a heart attack. These may include:   Chest pain or pressure, or a strange feeling in the chest.    Sweating.    Shortness of breath.    Pain, pressure, or a strange feeling in the back, neck, jaw, or upper belly or in one or both shoulders or arms.    Lightheadedness or sudden weakness.    A fast or irregular heartbeat.   After you call 911, the operator may tell you to chew 1 adult-strength or 2 to 4 low-dose aspirin. Wait for an ambulance. Do not try to drive yourself.  Watch closely for changes in your health, and be sure to contact your doctor if you have any problems.  Where can you learn more?  Go to https://www.bennett.info/ and enter F075 to learn more about "A Healthy Heart: Care Instructions."  Current as of: September 7, 2022Content Version: 13.6   2006-2023 Healthwise, Incorporated.   Care instructions adapted under license by [facility]. If you have questions about a medical condition or this instruction, always ask your healthcare professional.  Healthwise, Incorporated disclaims any warranty or liability  for your use of this information.      Personalized Preventive Plan for Elaine [name] - 05/11/2022  Medicare offers a range of preventive health benefits. Some of the tests and screenings are paid in full while other may be subject to a deductible, co-insurance, and/or copay.    Some of these benefits include a comprehensive review of your medical history including lifestyle, illnesses that may run in your family, and various assessments and screenings as appropriate.    After reviewing your medical record and screening and assessments performed today your provider may have ordered immunizations, labs, imaging, and/or referrals for you.  A list of these orders (if applicable) as well as your Preventive Care list are included within your After Visit Summary for your review.    Other Preventive Recommendations:    A preventive eye exam performed by an eye specialist is recommended every 1-2 years to screen for glaucoma; cataracts, macular degeneration, and other eye disorders.  A preventive dental visit is recommended every 6 months.  Try to get at least 150 minutes of exercise per week or 10,000 steps per day on a pedometer .  Order or download the FREE "Exercise & Physical Activity: Your Everyday Guide" from [employer]. Call ([phone] or search [employer] online.  You need 1200-1500 mg of calcium and 1000-2000 IU of vitamin D per day. It is possible to meet your calcium requirement with diet alone, but a vitamin D supplement is usually necessary to meet this goal.  When exposed to the sun, use a sunscreen that protects against both UVA and UVB radiation with an SPF of 30 or greater. Reapply every 2 to 3 hours or after sweating, drying off with a towel, or swimming.  Always wear a seat belt when traveling in a car. Always wear a helmet when riding a bicycle or motorcycle.

## 2022-05-14 ENCOUNTER — Encounter

## 2022-05-14 NOTE — Progress Notes (Unsigned)
Preoperative Evaluation    Date of Exam: 05/11/2022    Elaine [name] is a 71 y.o. female (DOB:18-Dec-1950) who presents for preoperative evaluation.   Procedure/Surgery:Left Hip Arthroplasty  Date of Procedure/Surgery: TBA  Surgeon: Dr Moishe Spice  Hospital/Surgical Facility: [facility]    Primary Physician: Marvia Pickles, MD    HPI:    Medical History:     Past Medical History:   Diagnosis Date    Anemia     Asthma     Colon polyp     DDD (degenerative disc disease) 02/15/2009    Depression 12/18/2011    DVT (deep venous thrombosis) (HCC) 10/23/2009    Left leg:  No DVT.      Essential hypertension, benign     History of echocardiogram 03/20/2004    EF 70%.  Borderline DDfx.  No significant valvular pathology.    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  Neg EKG on pharm stress test.    Thyroid disease     hypothyroidism     Allergies:     Allergies   Allergen Reactions    Aspirin Other (See Comments)     GI distress,     Diphenhydramine Other (See Comments)     Muscle jerking    Pollen Extract      Other reaction(s): Not Reported This Time      Medications:     Current Outpatient Medications   Medication Sig    predniSONE 10 MG (21) TBPK Use as directed    gabapentin (NEURONTIN) 300 MG capsule TAKE 1 CAPSULE EVERY MORNING  AND TAKE 2 CAPSULES EVERY NIGHT    montelukast (SINGULAIR) 10 MG  tablet Take 1 tablet by mouth daily    ZINC PO Take 1 tablet by mouth daily    albuterol sulfate HFA (PROVENTIL;VENTOLIN;PROAIR) 108 (90 Base) MCG/ACT inhaler Inhale 2 puffs into the lungs every 4 hours as needed    atorvastatin (LIPITOR) 40 MG tablet TAKE  1  TABLET  EVERY  DAY (NEED MD APPOINTMENT)    vitamin D3 (CHOLECALCIFEROL) 125 MCG (5000 UT) TABS tablet Take 1 tablet by mouth daily    fluticasone (FLONASE) 50 MCG/ACT nasal spray 2 sprays by Nasal route daily as needed    fluticasone-umeclidin-vilant (TRELEGY ELLIPTA) 200-62.5-25 MCG/ACT AEPB inhaler Inhale 1 puff into the lungs daily    iron dextran complex (INFED) 50 MG/ML injection Infuse 19.5 mLs intravenously    latanoprost (XALATAN) 0.005 % ophthalmic solution Apply 1 drop to eye    levothyroxine (SYNTHROID) 150 MCG tablet TAKE 1 TABLET EVERY DAY BEFORE BREAKFAST (NEED MD APPOINTMENT)    lidocaine (LIDODERM) 5 % Place 1 patch onto the skin every 24 hours    pantoprazole (PROTONIX) 40 MG  tablet Take 1 tablet by mouth daily    potassium chloride (KLOR-CON M) 10 MEQ extended release tablet TAKE 1 TABLET EVERY DAY    valsartan-hydroCHLOROthiazide (DIOVAN-HCT) 160-25 MG per tablet TAKE 1 TABLET EVERY DAY     No current facility-administered medications for this visit.     Surgical History:     Past Surgical History:   Procedure Laterality Date    COLONOSCOPY N/A 04/25/2020    SCREENING COLONOSCOPY performed by Raynelle Jan, MD at [facility] ENDOSCOPY    COLONOSCOPY  02-24-12    normal (Dr. Adelene Idler)    COLONOSCOPY FLX DX W/COLLJ [facility] WHEN PFRMD  02-2007    +polyp(tubular adenoma); Dr Adelene Idler    HERNIA REPAIR      3x    HYSTERECTOMY (CERVIX STATUS UNKNOWN)      NEUROLOGICAL SURGERY  03-16-2009    s/p ACD & Fusion (Dr.P.Gurtner)    TUBAL LIGATION       Social History:     Social History     Socioeconomic History    Marital status: Divorced     Spouse name: None    Number of children: None    Years of education: None    Highest education level: None   Tobacco Use     Smoking status: Former     Packs/day: 1.00     Years: 27.00     Pack years: 27.00     Types: Cigarettes     Start date: 10/16/1977     Quit date: 04/25/2004     Years since quitting: 18.0     Passive exposure: Never    Smokeless tobacco: Never   Substance and Sexual Activity    Alcohol use: No    Drug use: No    Sexual activity: Not Currently     Social Determinants of Health     Financial Resource Strain: Low Risk     Difficulty of Paying Living Expenses: Not hard at all   Food Insecurity: No Food Insecurity    Worried About Programme researcher, broadcasting/film/video in the Last Year: Never true    Ran Out of Food in the Last Year: Never true   Transportation Needs: Unknown    Lack of Transportation (Non-Medical): No   Physical Activity: Inactive    Days of Exercise per Week: 0 days    Minutes of Exercise per Session: 0 min   Housing Stability: Unknown    Unstable Housing in the Last Year: No       Recent use of: {PREOP RECENT MEDS:19490::"No recent use of aspirin (ASA), NSAIDS or steroids"}    Anesthesia Complications: None  History of abnormal bleeding : None      REVIEW OF SYSTEMS:  Respiratory: negative for {:31550}  Cardiovascular: negative for {Symptoms; cardiac:12860}    EXAM:   BP 123/83 (Site: Left Upper Arm, Position: Sitting, Cuff Size: Large Adult)   Pulse 98   Temp 98.1 F (36.7 C) (Temporal)   Resp 20   Ht 5\' 2"  (1.575 m)   Wt 187 lb (84.8 kg)   SpO2 98%   BMI 34.20 kg/m   {PE ADULT/PEDS  FEMALE/FEMALE:19492}      DIAGNOSTICS:   1. EKG: EKG FINDINGS - {Findings; ecg:5101::"normal EKG, normal sinus rhythm","unchanged from previous tracings"}  2. CXR: {Findings; cxr:60640}  3. Labs: {Latest Lab Result Choices:1002011}    IMPRESSION:   {Preop risk:16692::"None"}  {Preop assessment:15915}      ICD-10-CM    1. Medicare annual wellness visit,  subsequent  Z00.00       2. Left hip pain  M25.552       3. Acute pain of left shoulder  M25.512 [facility] - Ermalinda Barrios, MD, Orthopedic Surgery(General/Shoulder & Elbow), [place]  ([address])      4. Obstructive sleep apnea syndrome  G47.33 [facility] - Florestine Avers, DO, Sleep Medicine, [facility])      5. Somnolence  R40.0          Slater Mcmanaman L Heli Dino, MD   05/11/2022

## 2022-05-14 NOTE — Telephone Encounter (Signed)
Left message for Elaine [name] yes, Dr. Madelyn Brunner will clear patient from 05/11/2022 visit.

## 2022-05-14 NOTE — Telephone Encounter (Signed)
Elaine [name] is calling from [place] Dr. [name] [place]'s office. She is trying to get the patient scheduled for a left hip replacement sooner than originally anticipated. She wants to know if Dr. Madelyn Brunner can clear her based off her appt she just had on the 5th.    Her direct number is [phone]

## 2022-05-14 NOTE — Telephone Encounter (Signed)
Yes will do preop clearance on June 5 visit

## 2022-05-29 NOTE — Telephone Encounter (Signed)
Elaine [name] is aware patient will need to go to the ER for evaluation.

## 2022-05-29 NOTE — Telephone Encounter (Signed)
NURSE TRIAGE   Pt daughter Elaine [name] is calling , pt is having  Extreme fatigue  no appetite ,lathargic    [phone]

## 2022-05-29 NOTE — Telephone Encounter (Signed)
Would take her to ER to be evaluated

## 2022-06-12 ENCOUNTER — Encounter: Payer: MEDICARE | Attending: Internal Medicine | Primary: Internal Medicine

## 2022-06-12 ENCOUNTER — Inpatient Hospital Stay: Payer: Medicare (Managed Care) | Attending: Gastroenterology

## 2022-06-12 MED ORDER — PROPOFOL 200 MG/20ML IV EMUL
200 MG/20ML | INTRAVENOUS | Status: DC | PRN
Start: 2022-06-12 — End: 2022-06-12
  Administered 2022-06-12: 18:00:00 20 via INTRAVENOUS
  Administered 2022-06-12: 18:00:00 60 via INTRAVENOUS
  Administered 2022-06-12: 18:00:00 40 via INTRAVENOUS
  Administered 2022-06-12 (×2): 30 via INTRAVENOUS
  Administered 2022-06-12: 18:00:00 70 via INTRAVENOUS

## 2022-06-12 MED ORDER — LACTATED RINGERS IV SOLN
INTRAVENOUS | Status: DC
Start: 2022-06-12 — End: 2022-06-12
  Administered 2022-06-12: 16:00:00 via INTRAVENOUS

## 2022-06-12 MED ORDER — GLYCOPYRROLATE 0.2 MG/ML IJ SOLN
0.2 MG/ML | INTRAMUSCULAR | Status: DC | PRN
Start: 2022-06-12 — End: 2022-06-12
  Administered 2022-06-12: 17:00:00 .2 via INTRAVENOUS

## 2022-06-12 MED ORDER — ESMOLOL HCL 100 MG/10ML IV SOLN
100 MG/10ML | INTRAVENOUS | Status: DC | PRN
Start: 2022-06-12 — End: 2022-06-12
  Administered 2022-06-12: 18:00:00 30 via INTRAVENOUS

## 2022-06-12 MED ORDER — GLUCAGON HCL RDNA (DIAGNOSTIC) 1 MG IJ SOLR
1 MG | INTRAMUSCULAR | Status: DC | PRN
Start: 2022-06-12 — End: 2022-06-12
  Administered 2022-06-12 (×2): .5 via INTRAVENOUS

## 2022-06-12 MED ORDER — LIDOCAINE HCL (PF) 2 % IJ SOLN
2 % | INTRAMUSCULAR | Status: DC | PRN
Start: 2022-06-12 — End: 2022-06-12
  Administered 2022-06-12: 18:00:00 100 via INTRAVENOUS

## 2022-06-12 MED FILL — LACTATED RINGERS IV SOLN: INTRAVENOUS | Qty: 1000

## 2022-06-12 NOTE — Discharge Instructions (Signed)
Esophagogastroduodenoscopy (EGD)    Upper Exam Discharge Instructions    Monitored Anesthesia Care (MAC)/Sedation     Do not drive or operate machinery for 24 hours.   Do not consume alcohol, tranquilizers, sleeping medications or any non- prescribed medication for 24 hours.  Do not make important decisions or sign any important papers in the next 24 hours.  You are advised to go directly home from the hospital. You should have someone with you tonight at home.    Activity    Restrict your activities and rest for a day. Resume light to normal activity tomorrow.    Fluids and Diet    You may resume regular diet and activity after exam unless otherwise specified by your physician.     Special diet instructions:     Medications    You may resume your normal daily prescription medication schedule unless otherwise specified by your physician.   For mild soreness in your throat you may use Cepacol throat lozenges or warm salt-water gargles as needed.    Contact your physician if you have any of the following:     Vomiting blood or you pass maroon or bloody stools  Severe abdominal pain   Fever or Chills     IF YOU HAVE PROBLEMS THAT CONCERN YOU:    Call your doctor.    After office hours, you can reach your physician through their answering service.     IF YOU NEEDIMMEDIATE ATTENTION, CALL 911 OR GO TO THE NEAREST EMERGENCY DEPARTMENT. Our [facility] number is [phone]    Any additional instructions:

## 2022-06-12 NOTE — H&P (Signed)
Update History & Physical    The patient's History and Physical of June 10, 2022 was reviewed with the patient and I examined the patient. There was no change. The surgical site was confirmed by the patient and me.       Plan: The risks, benefits, expected outcome, and alternative to the recommended procedure have been discussed with the patient. Patient understands and wants to proceed with the procedure.     Electronically signed by Sloan Leiter, MD on 06/12/2022 at 1:04 PM

## 2022-06-12 NOTE — Progress Notes (Signed)
[place] [phone]

## 2022-06-12 NOTE — Anesthesia Post-Procedure Evaluation (Signed)
Department of Anesthesiology  Postprocedure Note    Patient: Elaine [name]  MRN: [id number]  Birthdate: 09-15-1951  Date of evaluation: 06/12/2022      Procedure Summary     Date: 06/12/22 Room / Location: [place] END[facility] 01 / [facility]    Anesthesia Start: 5993 Anesthesia Stop: 5701    Procedure: ENTEROSCOPY PUSH DIAGNOSTIC w/small intestinal bipolar cautery of multiple AVMs (Upper GI Region) Diagnosis:       Iron deficiency anemia, unspecified iron deficiency anemia type      (Iron deficiency anemia, unspecified iron deficiency anemia type [D50.9])    Surgeons: Sloan Leiter, MD Responsible Provider: Noelle Penner, MD    Anesthesia Type: General ASA Status: 3          Anesthesia Type: General    Aldrete Phase I:      Aldrete Phase II: Aldrete Score: 10      Anesthesia Post Evaluation    Patient location during evaluation: PACU  Patient participation: complete - patient participated  Level of consciousness: awake  Airway patency: patent  Nausea & Vomiting: no vomiting  Complications: no  Cardiovascular status: blood pressure returned to baseline  Respiratory status: acceptable  Hydration status: euvolemic  Multimodal analgesia pain management approach

## 2022-06-12 NOTE — Anesthesia Pre-Procedure Evaluation (Signed)
Department of Anesthesiology  Preprocedure Note       Name:  Elaine [name]   Age:  71 y.o.  DOB:  [birth year]                                          MRN:  [id number]         Date:  06/12/2022      Surgeon: Juliann Mule):  Sloan Leiter, MD    Procedure: Procedure(s):  EGD ESOPHAGOGASTRODUODENOSCOPY    Medications prior to admission:   Prior to Admission medications    Medication Sig Start Date End Date Taking? Authorizing Provider   predniSONE 10 MG (21) TBPK Use as directed 05/11/22   Thana Farr, MD   gabapentin (NEURONTIN) 300 MG capsule TAKE 1 CAPSULE EVERY MORNING  AND TAKE 2 CAPSULES EVERY NIGHT 04/14/22 07/13/22  Thana Farr, MD   montelukast (SINGULAIR) 10 MG tablet Take 1 tablet by mouth daily 03/30/22   Thana Farr, MD   ZINC PO Take 1 tablet by mouth daily    Ar Automatic Reconciliation   albuterol sulfate HFA (PROVENTIL;VENTOLIN;PROAIR) 108 (90 Base) MCG/ACT inhaler Inhale 2 puffs into the lungs every 4 hours as needed 06/11/20   Ar Automatic Reconciliation   atorvastatin (LIPITOR) 40 MG tablet TAKE  1  TABLET  EVERY  DAY (NEED MD APPOINTMENT) 10/27/21   Ar Automatic Reconciliation   vitamin D3 (CHOLECALCIFEROL) 125 MCG (5000 UT) TABS tablet Take 1 tablet by mouth daily    Ar Automatic Reconciliation   fluticasone (FLONASE) 50 MCG/ACT nasal spray 2 sprays by Nasal route daily as needed 08/01/21   Ar Automatic Reconciliation   fluticasone-umeclidin-vilant (TRELEGY ELLIPTA) 200-62.5-25 MCG/ACT AEPB inhaler Inhale 1 puff into the lungs daily 10/23/21   Ar Automatic Reconciliation   iron dextran complex (INFED) 50 MG/ML injection Infuse 19.5 mLs intravenously 10/06/21   Ar Automatic Reconciliation   latanoprost (XALATAN) 0.005 % ophthalmic solution Apply 1 drop to eye 02/06/17   Ar Automatic Reconciliation   levothyroxine (SYNTHROID) 150 MCG tablet TAKE 1 TABLET EVERY DAY BEFORE BREAKFAST (NEED MD APPOINTMENT) 07/30/21   Ar Automatic Reconciliation   lidocaine (LIDODERM) 5 % Place 1 patch onto the skin every 24 hours  07/29/21   Ar Automatic Reconciliation   pantoprazole (PROTONIX) 40 MG tablet Take 1 tablet by mouth daily 11/03/21   Ar Automatic Reconciliation   potassium chloride (KLOR-CON M) 10 MEQ extended release tablet TAKE 1 TABLET EVERY DAY 11/03/21   Ar Automatic Reconciliation   valsartan-hydroCHLOROthiazide (DIOVAN-HCT) 160-25 MG per tablet TAKE 1 TABLET EVERY DAY 07/30/21   Ar Automatic Reconciliation       Current medications:    No current facility-administered medications for this encounter.       Allergies:    Allergies   Allergen Reactions   . Aspirin Other (See Comments)     GI distress,    . Diphenhydramine Other (See Comments)     Muscle jerking   . Pollen Extract      Other reaction(s): Not Reported This Time       Problem List:    Patient Active Problem List   Diagnosis Code   . Pre-diabetes R73.03   . Vitamin D deficiency E55.9   . Osteoarthritis of left knee M17.12   . Sciatica M54.30   . URI (upper respiratory infection) J06.9   .  Routine general medical examination at a health care facility Z00.00   . Cough R05.9   . Encounter for long-term (current) use of medications Z79.899   . Iron deficiency anemia D50.9   . Need for pneumococcal vaccination Z23   . Chronic midline low back pain without sciatica M54.50, G89.29   . Depression F32.A   . DOE (dyspnea on exertion) R06.09   . HTN (hypertension), benign I10   . Memory problem R41.3   . Meralgia paraesthetica G57.10   . Migraine without aura and without status migrainosus, not intractable G43.009   . Claustrophobia F40.240   . Class 1 obesity with body mass index (BMI) of 34.0 to 34.9 in adult E66.9, Z68.34   . Rash R21   . Hypothyroidism E03.9   . Post-menopausal Z78.0   . 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 Asthma J45.909   . Chest pain R07.9   . Chronic diastolic congestive heart failure (HCC) I50.32   . Sore throat J02.9   . Submental lymphadenopathy R59.0   . Glaucoma screening Z13.5   . Left knee pain M25.562   . Pure hypercholesterolemia E78.00   .  GERD (gastroesophageal reflux disease) K21.9   . Abnormal stress test R94.39   . Dehydration E86.0   . Need for hepatitis C screening test Z11.59   . Swelling of left lower extremity M79.89   . Hospital discharge follow-up Z09   . Muscle spasm of left lower extremity M62.838   . Left leg pain M79.605   . Needs flu shot Z23   . Primary osteoarthritis of left hip M16.12       Past Medical History:        Diagnosis Date   . Anemia    . Asthma    . Colon polyp    . DDD (degenerative disc disease) 02/15/2009   . Depression 12/18/2011   . DVT (deep venous thrombosis) ([place]) 10/23/2009    Left leg:  No DVT.     [name] Essential hypertension, benign    . History of echocardiogram 03/20/2004    EF 70%.  Borderline DDfx.  No significant valvular pathology.   . Hypercholesterolemia    . Hypertension    . Iron deficiency anemia 02/15/2009   . Lichen planus 0/17/5102   . Meralgia paraesthetica 10/22/2013   . OA (osteoarthritis of spine) 02/15/2009   . OA (osteoarthritis of spine) 02/15/2009   . Obesity, unspecified    . Other and unspecified hyperlipidemia    . Pre-operative cardiovascular examination     For spine surgery   . Right sided sciatica    . Shortness of breath     Possible asthma, HCVD; less likely CAD (Noted 03/15/09)   . Sleep apnea 02/15/2009    uses cpap machine   . Thallium stress test abnormal 03/20/2004    Partially transient, mod basal & mid anterior defect most c/w artifact; mild anterior ischemia less likely.  Neg EKG on pharm stress test.   . Thyroid disease     hypothyroidism       Past Surgical History:        Procedure Laterality Date   . COLONOSCOPY N/A 04/25/2020    SCREENING COLONOSCOPY performed by Sloan Leiter, MD at [facility]   . COLONOSCOPY  02-24-12    normal (Dr. Angelique Holm)   . COLONOSCOPY FLX DX W/COLLJ SPEC WHEN PFRMD  02-2007    +polyp(tubular adenoma); Dr Angelique Holm   . HERNIA REPAIR  3x   . HYSTERECTOMY (CERVIX STATUS UNKNOWN)     . NEUROLOGICAL SURGERY  03-16-2009    s/p ACD & Fusion (Dr.P.Gurtner)   .  TUBAL LIGATION         Social History:    Social History     Tobacco Use   . Smoking status: Former     Packs/day: 1.00     Years: 27.00     Pack years: 27.00     Types: Cigarettes     Start date: 10/16/1977     Quit date: 04/25/2004     Years since quitting: 18.1     Passive exposure: Never   . Smokeless tobacco: Never   Substance Use Topics   . Alcohol use: No                                Counseling given: Not Answered      Vital Signs (Current): There were no vitals filed for this visit.                                           BP Readings from Last 3 Encounters:   05/11/22 123/83   03/30/22 106/60   03/23/22 124/65       NPO Status:                                                                                 BMI:   Wt Readings from Last 3 Encounters:   05/11/22 187 lb (84.8 kg)   04/20/22 186 lb 12.8 oz (84.7 kg)   03/30/22 191 lb (86.6 kg)     There is no height or weight on file to calculate BMI.    CBC:   Lab Results   Component Value Date/Time    WBC 4.6 09/11/2021 10:30 AM    RBC 4.24 09/11/2021 10:30 AM    HGB 11.1 09/11/2021 10:30 AM    HCT 34.0 09/11/2021 10:30 AM    MCV 80 09/11/2021 10:30 AM    RDW 15.7 09/11/2021 10:30 AM    PLT 312 09/11/2021 10:30 AM       CMP:   Lab Results   Component Value Date/Time    NA 142 03/23/2022 12:00 AM    K 3.4 03/23/2022 12:00 AM    CL 100 03/23/2022 12:00 AM    CO2 26 03/23/2022 12:00 AM    BUN 6 03/23/2022 12:00 AM    CREATININE 0.67 03/23/2022 12:00 AM    GFRAA >60 05/26/2021 08:05 AM    AGRATIO 1.8 03/23/2022 12:00 AM    LABGLOM 93 03/23/2022 12:00 AM    GLUCOSE 150 03/23/2022 12:00 AM    PROT 6.5 03/23/2022 12:00 AM    CALCIUM 9.4 03/23/2022 12:00 AM    BILITOT 0.4 03/23/2022 12:00 AM    ALKPHOS 84 03/23/2022 12:00 AM    AST 17 03/23/2022 12:00 AM    ALT 11 03/23/2022 12:00 AM       POC Tests: No results for input(s):  POCGLU, POCNA, POCK, POCCL, POCBUN, POCHEMO, POCHCT in the last 72 hours.    Coags: No results found for: PROTIME, INR, APTT    HCG (If  Applicable): No results found for: PREGTESTUR, PREGSERUM, HCG, HCGQUANT     ABGs: No results found for: PHART, PO2ART, PCO2ART, HCO3ART, BEART, O2SATART     Type & Screen (If Applicable):  No results found for: LABABO, LABRH    Drug/Infectious Status (If Applicable):  Lab Results   Component Value Date/Time    HEPCAB <0.1 11/19/2015 12:00 AM       COVID-19 Screening (If Applicable):   Lab Results   Component Value Date/Time    COVID19 Not Detected 11/30/2019 12:00 AM           Anesthesia Evaluation  Patient summary reviewed and Nursing notes reviewed  Airway: Mallampati: II  TM distance: >3 FB   Neck ROM: limited  Mouth opening: > = 3 FB   Dental:    (+) upper dentures and lower dentures      Pulmonary: breath sounds clear to auscultation  (+) sleep apnea: on CPAP,  asthma:                            Cardiovascular:    (+) hypertension:, CHF:, DOE:,         Rhythm: regular                      Neuro/Psych:   (+) headaches:, psychiatric history:depression/anxiety              ROS comment: DDD.[name] GI/Hepatic/Renal:   (+) GERD:,           Endo/Other:    (+) hypothyroidism, blood dyscrasia: anemia, arthritis:., .                 Abdominal:             Vascular:          Other Findings:           Anesthesia Plan      general and TIVA     ASA 3       Induction: intravenous.      Anesthetic plan and risks discussed with patient.      Plan discussed with CRNA.    Attending anesthesiologist reviewed and agrees with Preprocedure content                Arletha Pili, MD   06/12/2022

## 2022-06-15 ENCOUNTER — Ambulatory Visit: Admit: 2022-06-15 | Discharge: 2022-06-15 | Payer: MEDICARE | Attending: Internal Medicine | Primary: Internal Medicine

## 2022-06-15 ENCOUNTER — Ambulatory Visit: Payer: MEDICARE | Primary: Internal Medicine

## 2022-06-15 ENCOUNTER — Inpatient Hospital Stay: Admit: 2022-06-15 | Payer: MEDICARE | Primary: Internal Medicine

## 2022-06-15 VITALS — BP 148/50 | HR 82 | Temp 98.20000°F | Resp 18 | Ht 62.0 in | Wt 173.0 lb

## 2022-06-15 DIAGNOSIS — D500 Iron deficiency anemia secondary to blood loss (chronic): Principal | ICD-10-CM

## 2022-06-15 DIAGNOSIS — E890 Postprocedural hypothyroidism: Secondary | ICD-10-CM

## 2022-06-15 LAB — TSH + FREE T4 PANEL
T4 Free: 1.7 NG/DL — ABNORMAL HIGH (ref 0.7–1.5)
TSH, 3rd Generation: 0.07 u[IU]/mL — ABNORMAL LOW (ref 0.36–3.74)

## 2022-06-15 NOTE — Other (Signed)
Thyroid level is mildly high. She can decrease her synthroid 150 mcg to 6 x/week. This would not cause fatigue.

## 2022-06-15 NOTE — Progress Notes (Signed)
Elaine [name] presents today for   Chief Complaint   Patient presents with    Fatigue       Vitals:    06/15/22 0907 06/15/22 0913   BP: (!) 146/53 (!) 148/50   Site: Right Upper Arm Right Upper Arm   Position: Sitting Sitting   Pulse: 82    Resp: 18    Temp: 98.2 F (36.8 C)    TempSrc: Temporal    SpO2: 94%    Weight: 173 lb (78.5 kg)    Height: '5\' 2"'$  (1.575 m)         Is someone accompanying this pt? no    Is the patient using any DME equipment during OV? cane    Depression Screening:  PHQ-9 Questionaire 06/15/2022   Little interest or pleasure in doing things 0   Feeling down, depressed, or hopeless 0   Trouble falling or staying asleep, or sleeping too much -   Feeling tired or having little energy -   Poor appetite or overeating -   Feeling bad about yourself - or that you are a failure or have let yourself or your family down -   Trouble concentrating on things, such as reading the newspaper or watching television -   Moving or speaking so slowly that other people could have noticed. Or the opposite - being so fidgety or restless that you have been moving around a lot more than usual -   Thoughts that you would be better off dead, or of hurting yourself in some way -   PHQ-9 Total Score 0   If you checked off any problems, how difficult have these problems made it for you to do your work, take care of things at home, or get along with other people? -       Abuse Screening:  AMB Abuse Screening 06/15/2022   Do you ever feel afraid of your partner? N   Are you in a relationship with someone who physically or mentally threatens you? N   Is it safe for you to go home? Y       Fall Screening  Fall Risk 06/15/2022   2 or more falls in past year? yes   Fall with injury in past year? yes       Generalized Anxiety  GAD-7 SCREENING 06/15/2022   Feeling nervous, anxious, or on edge Not at all   Not being able to stop or control worrying Not at all   Worrying too much about different things Not at all   Trouble relaxing  Not at all   Being so restless that it is hard to sit still Not at all   Becoming easily annoyed or irritable Not at all   Feeling afraid as if something awful might happen Not at all   GAD-7 Total Score 0        Health Maintenance Due   Topic Date Due    COVID-19 Vaccine (3 - Booster for Pfizer series) 05/15/2020    Shingles vaccine (3 of 3) 10/27/2021   .      Health Maintenance reviewed and discussed and ordered per Provider.  Vaccines Due   Screenings Due       Elaine [name] is updated on all HM    1. "Have you been to the ER, urgent care clinic since your last visit?  Hospitalized since your last visit?" Yes When: 6/23 [facility]: [facility] Reason for visit: Fatigue  2. "Have you seen or consulted any other health care providers outside of the [facility] since your last visit?" No     3. For patients aged 4-75: Has the patient had a colonoscopy / FIT/ Cologuard? Yes     If the patient is female:    4. For patients aged 52-74: Has the patient had a mammogram within the past 2 years? Yes    5. For patients aged 21-65: Has the patient had a pap smear? N/A

## 2022-06-17 NOTE — Telephone Encounter (Signed)
-----   Message from Thana Farr, MD sent at 06/15/2022  4:49 PM EDT -----  Thyroid level is mildly high. She can decrease her synthroid 150 mcg to 6 x/week. This would not cause fatigue.

## 2022-06-17 NOTE — Telephone Encounter (Signed)
Patient aware of message and verbalizes understanding.

## 2022-06-19 ENCOUNTER — Encounter: Attending: Specialist | Primary: Internal Medicine

## 2022-06-19 ENCOUNTER — Ambulatory Visit: Admit: 2022-06-19 | Discharge: 2022-06-19 | Payer: MEDICARE | Attending: Specialist | Primary: Internal Medicine

## 2022-06-19 VITALS — BP 146/69 | HR 79 | Ht 62.0 in | Wt 174.0 lb

## 2022-06-19 DIAGNOSIS — I5032 Chronic diastolic (congestive) heart failure: Principal | ICD-10-CM

## 2022-06-19 MED ORDER — VALSARTAN 80 MG PO TABS
80 | ORAL_TABLET | Freq: Every day | ORAL | 1 refills | Status: DC
Start: 2022-06-19 — End: 2022-07-29

## 2022-06-19 NOTE — Patient Instructions (Signed)
The medication list included in this document is our record of what you are currently taking, including any changes that were made at today's visit.  If you find any differences when compared to your medications at home, or have any questions that were not answered at your visit, please contact the office.    After the recommended changes have been made in blood pressure medicines, patient advised to keep BP/HR(pulse rate) chart twice daily and bring us results in next 4 to 5 days. Patient may send the results via "My Chart" if desired.  Please rest for 5-10 minutes before checking blood pressure.  Sit on a comfortable chair without crossing the legs and put your arm on a table.  We recommend that you use an upper arm cuff.  Check the blood pressure 3 times each time you check the blood pressure and record the lowest reading.  If you check the blood pressure in both arms, use the higher reading.

## 2022-06-19 NOTE — Telephone Encounter (Signed)
Patient called back , states she decided she wants to go ahead and have this done and to disregard this message.

## 2022-06-19 NOTE — Progress Notes (Signed)
Elaine [name] is a 71 y.o. year old female.    Follow-up of CHF, hypertension, hyperlipidemia and obesity    05/2018 - Mild SOB today related to asthma - improved with inhaler use.  8/21 intermittent wheezing and shortness of breath which is improved with inhalers.    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  Denies chest pain, shortness of breath edema dizziness or palpitations.  Status post IV iron but no blood transfusion was given.  Blood pressure was too low and she had stopped medications for blood pressure.  Has lost 20 pounds in last 3 weeks.        Review of Systems   Constitutional: Negative.    HENT: Negative.     Eyes: Negative.    Respiratory: Negative.     Cardiovascular: Negative.    Gastrointestinal:  Positive for blood in stool.   Endocrine: Negative.    Genitourinary: Negative.    Musculoskeletal: Negative.    Neurological: Negative.    Psychiatric/Behavioral: Negative.     All other systems reviewed and are negative.      Physical Exam  Vitals and nursing note reviewed.   Constitutional:       Appearance: Normal appearance.   HENT:      Head: Normocephalic and atraumatic.      Nose: Nose normal.   Eyes:      Conjunctiva/sclera: Conjunctivae normal.   Cardiovascular:      Rate and Rhythm: Normal rate and regular rhythm.      Pulses: Normal pulses.      Heart sounds: Normal heart sounds.   Pulmonary:      Effort: Pulmonary effort is normal.      Breath sounds: Normal breath sounds.   Abdominal:      General: Bowel sounds are normal.      Palpations: Abdomen is soft.   Musculoskeletal:         General: Normal range of motion.      Right lower leg: No edema.      Left lower leg: No edema.   Skin:     General: Skin is warm and dry.   Neurological:      General: No focal deficit present.      Mental Status: She is alert and oriented to person, place, and time.    Psychiatric:         Mood and Affect: Mood normal.         Behavior: Behavior normal.      Allergies   Allergen Reactions    Aspirin Other (See Comments)     GI distress,     Diphenhydramine Other (See Comments)     Muscle jerking    Pollen Extract      Other reaction(s): Not Reported This Time       Past Medical History:   Diagnosis Date    Anemia     Asthma     Colon polyp     DDD (degenerative disc disease) 02/15/2009    Depression 12/18/2011    DVT (deep venous thrombosis) ([place]) 10/23/2009    Left leg:  No DVT.      Essential hypertension, benign     History of echocardiogram 03/20/2004    EF 70%.  Borderline DDfx.  No significant valvular pathology.    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  Neg EKG on pharm stress test.    Thyroid disease     hypothyroidism       Family History   Problem Relation Age of Onset    Hypertension Sister         x3    Hypertension Mother     Cancer Mother     Heart Surgery Sister     Heart Disease Sister     Hypertension Paternal Uncle     Cancer Maternal Aunt         breast    Breast Cancer Maternal Aunt     Glaucoma Son     Heart Defect Son     Diabetes Paternal Uncle     Heart Attack Sister        Social History     Tobacco Use    Smoking status: Former     Packs/day: 1.00     Years: 27.00     Pack years: 27.00     Types: Cigarettes     Start date: 10/16/1977     Quit  date: 04/25/2004     Years since quitting: 18.1     Passive exposure: Never    Smokeless tobacco: Never   Substance Use Topics    Alcohol use: No    Drug use: No        Current Outpatient Medications   Medication Sig Dispense Refill    cyanocobalamin 1000 MCG tablet Take 2.5 tablets by mouth daily      gabapentin (NEURONTIN) 300 MG capsule TAKE 1 CAPSULE EVERY MORNING  AND TAKE 2 CAPSULES EVERY NIGHT 270 capsule 1    ZINC PO Take 1 tablet by mouth daily      albuterol sulfate HFA (PROVENTIL;VENTOLIN;PROAIR) 108 (90 Base) MCG/ACT inhaler Inhale 2 puffs into the lungs every 4 hours as needed      atorvastatin (LIPITOR) 40 MG tablet TAKE  1  TABLET  EVERY  DAY (NEED MD APPOINTMENT)      vitamin D3 (CHOLECALCIFEROL) 125 MCG (5000 UT) TABS tablet Take 1 tablet by mouth daily      iron dextran complex (INFED) 50 MG/ML injection Infuse 19.5 mLs intravenously      latanoprost (XALATAN) 0.005 % ophthalmic solution Apply 1 drop to eye      levothyroxine (SYNTHROID) 150 MCG tablet TAKE 1 TABLET EVERY DAY BEFORE BREAKFAST (NEED MD APPOINTMENT)      lidocaine (LIDODERM) 5 % Place 1 patch onto the skin every 24 hours      pantoprazole (PROTONIX) 40 MG tablet Take 1 tablet by mouth daily      potassium chloride (KLOR-CON M) 10 MEQ extended release tablet TAKE 1 TABLET EVERY DAY      valsartan-hydroCHLOROthiazide (DIOVAN-HCT) 160-25 MG per tablet TAKE 1  TABLET EVERY DAY       No current facility-administered medications for this visit.        Past Surgical History:   Procedure Laterality Date    COLONOSCOPY N/A 04/25/2020    SCREENING COLONOSCOPY performed by Sloan Leiter, MD at [facility] ENDOSCOPY    COLONOSCOPY  02-24-12    normal (Dr. Angelique Holm)    COLONOSCOPY FLX DX W/COLLJ [facility] WHEN PFRMD  02-2007    +polyp(tubular adenoma); Dr Angelique Holm    HERNIA REPAIR      3x    HYSTERECTOMY ([place])      NEUROLOGICAL SURGERY  03-16-2009    s/p ACD & Fusion (Dr.P.Gurtner)    TUBAL LIGATION      UPPER GASTROINTESTINAL ENDOSCOPY N/A 06/12/2022     ENTEROSCOPY PUSH DIAGNOSTIC w/small intestinal bipolar cautery of multiple AVMs performed by Sloan Leiter, MD at [place]:    06/19/22 0859   BP: (!) 146/69   Site: Left Upper Arm   Position: Sitting   Cuff Size: Medium Adult   Pulse: 79   SpO2: 94%   Weight: 174 lb (78.9 kg)   Height: '5\' 2"'$  (1.575 m)          Diagnostic Studies:  I have reviewed the relevant tests done on the patient and show as follows  EKG tracings reviewed by me today.      Ms. Minkoff has a reminder for a "due or due soon" health maintenance. I have asked that she contact her primary care provider for follow-up on this health maintenance.    1/18 Nuc Stress  Diagnosis:   Probably normal scan.    Evidence of a small mild fixed inferior wall defect noted from his nuclear study suggestive of tissue attenuation with normal wall motion in the area.    No reversible defect suggesting ischemia noted from his nuclear study.   Normal left ventricular size and systolic function.   Low risk scan.     1/18 ECHO  SUMMARY:  Left ventricle: Systolic function was hyperdynamic. Ejection fraction was  estimated in the range of 70 % to 75 %. There were no regional wall motion  abnormalities. There was mild concentric hypertrophy. Doppler parameters  were consistent with abnormal left ventricular relaxation (grade 1  diastolic dysfunction).    Mitral valve: There was mild annular calcification.      ASSESSMENT & PLAN    Lab Results   Component Value Date    CHOL 170 03/23/2022    CHOL 190 11/17/2021    CHOL 141 01/03/2021    CHOL 160 02/27/2020    CHOL 159 09/01/2019    CHOL 175 06/06/2019    CHOL 197 10/10/2018     Lab Results   Component Value Date    TRIG 102 03/23/2022    TRIG 117 11/17/2021    TRIG 76 01/03/2021    TRIG 90 02/27/2020    TRIG 108 09/01/2019    TRIG 69 06/06/2019    TRIG 90 10/10/2018     Lab Results   Component Value Date    HDL 54 03/23/2022    HDL 66 11/17/2021    HDL 52 01/03/2021    HDL 57 02/27/2020    HDL 61  09/01/2019    HDL 57 06/06/2019    HDL 62 (H) 10/10/2018     Lab Results   Component Value Date    LDLCALC 97 03/23/2022  LDLCALC 103 (H) 11/17/2021    LDLCALC 74 01/03/2021    LDLCALC 86 02/27/2020    LDLCALC 79 09/01/2019    LDLCALC 104 (H) 06/06/2019    LDLCALC 117 (H) 10/10/2018     Lab Results   Component Value Date    LABVLDL 19 03/23/2022    LABVLDL 14 06/06/2019    LABVLDL 18 10/10/2018    VLDL 21 11/17/2021    VLDL 15 01/03/2021    VLDL 17 02/27/2020    VLDL 19 09/01/2019           7/20 Stable CHF.  B/P is controlled.  Continue same medications.  NYHA II.  Weight diet and exercise discussed.    07/18/2020 CHF compensated.  Blood pressure is well controlled.  Lipids are better this year than last year.  She is trying to lose weight.  Mediterranean diet guidelines printed.  Exercise and physical therapy for the back encouraged.      07/04/2021 CHF is compensated NYHA class II.  Blood pressure is controlled well.  Since no dizziness, continue same medications.  Lipids are controlled well.  Diet weight and exercise discussed.  Mediterranean diet guidelines given.    Jodiann was seen today for follow-up.    Diagnoses and all orders for this visit:    Chronic diastolic (congestive) heart failure (HCC)  -     EKG 12 Lead    Pure hypercholesterolemia, unspecified  -     EKG 12 Lead    Gastrointestinal hemorrhage, unspecified gastrointestinal hemorrhage type    Essential (primary) hypertension  -     valsartan (DIOVAN) 80 MG tablet; Take 1 tablet by mouth daily Hold if SBP less than 120    Obesity (BMI 30.0-34.9)    HTN (hypertension), benign        Pertinent laboratory and test data reviewed and discussed with patient.  See patient instructions also for other medical advice given    Medications Discontinued During This Encounter   Medication Reason    fluticasone (FLONASE) 50 MCG/ACT nasal spray LIST CLEANUP    fluticasone-umeclidin-vilant (TRELEGY ELLIPTA) 200-62.5-25 MCG/ACT AEPB inhaler LIST CLEANUP     montelukast (SINGULAIR) 10 MG tablet LIST CLEANUP    predniSONE 10 MG (21) TBPK LIST CLEANUP       Follow-up and Dispositions    Return in about 6 months (around 12/20/2022), or if symptoms worsen or fail to improve, for post test/procedure, BP log x 3-5 days post med changes.           Return to ER if any significant CP not relieved by s/l NTG, severe SOB, severe palpitations, loss of consciousness    06/19/2022 blood pressure is mildly elevated and she has not been taking her medications as it was low during GI bleed.  Will start low-dose plain valsartan at 80 mg a day and follow the home chart.  I also instructed the patient to skip the dose if SBP less than 120.  She should monitor the blood pressure and send it back to [place] in a few days.  CHF has clinically resolved as she has lost significant weight.  Recommend to eat healthy and try to not gain the weight again.  Mediterranean diet guidelines printed again.  Lipids will need to be followed up in future after GI bleed stabilizes.

## 2022-06-19 NOTE — Progress Notes (Signed)
[name] presents today for   Chief Complaint   Patient presents with    Follow-up     16yr      JTruett Mainlandpreferred language for health care discussion is english/other.    Is someone accompanying this pt? no    Is the patient using any DME equipment during [place]    Depression Screening:  Depression: Not at risk    PHQ-2 Score: 0     Fall Risk  Amb Fall Risk Assessment and TUG Test 06/15/2022   Do you feel unsteady or are you worried about falling?  yes   2 or more falls in past year? yes   Fall with injury in past year? yes   Fall in past 12 months? -   Able to walk? -   Total Score -       Pt currently taking Anticoagulant therapy? no    Coordination of Care:  1. Have you been to the ER, urgent care clinic since your last visit? Hospitalized since your last visit? no    2. Have you seen or consulted any other health [place] providers [place]? Include any pap smears or colon screening. Endo on 06/12/22

## 2022-06-19 NOTE — Telephone Encounter (Signed)
Pt calling re: CT of abdomin.    Stated it is scheduled for this Monday 06/22/22    She is asking if you still wanted her to have it done? Stated with all that has been going on recently

## 2022-06-19 NOTE — Progress Notes (Signed)
Elaine [name] (DOB:  16-Feb-1951) is a 71 y.o. female,Established patient, here for evaluation of the following chief complaint(s):  Fatigue, Anemia, Weight Loss, and Hypertension         ASSESSMENT/PLAN:    ICD-10-CM    1. Iron deficiency anemia due to chronic blood loss  D50.0 Appears to be due to gastrointestinal AVMs.  Patient being followed by GI and hematology.  For completeness we will check CT ABDOMEN PELVIS W IV CONTRAST Additional Contrast? Radiologist Recommendation      2. At high risk for falls  Z91.81       3. 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 Essential (primary) hypertension  I10 Blood pressure elevated today on Diovan HCT.  Comprehensive Metabolic Panel      5. Weight loss  R63.4 CT ABDOMEN PELVIS W IV CONTRAST Additional Contrast? Radiologist Recommendation      6. Anorexia  R63.0 CT ABDOMEN PELVIS W IV CONTRAST Additional Contrast? Radiologist Recommendation             Return if symptoms worsen or fail to improve.         Subjective   SUBJECTIVE/OBJECTIVE:  Patient has had increasing fatigue over the last several weeks with sleeping for long periods of time and falling asleep suddenly.  Patient has been seen by Dr. Edd Arbour hematology for hemoglobin 7.7 which is most likely etiology of patient's fatigue.  She is scheduled for iron infusion.  She was seen by Dr. Angelique Holm from GI and had EGD that showed AVMs.  Patient was advised to follow-up with me to consider any other problems for her anemia.  Because she has had anorexia and associated weight loss we will go ahead and do a CT of abdomen pelvis to evaluate for any other underlying pathology.  We will also go ahead and check a TSH.  Patient's blood pressure is elevated in the office today.  She continues on Diovan HCT. More recent note by Dr Lyndel Safe shows patient had hypotension during the EGD and is now just on Diovan 80 mg daily.          Review of Systems   Constitutional:  Positive for  fatigue.   Cardiovascular:  Negative for chest pain.        Objective   Physical Exam   Visit Vitals  BP (!) 148/50 (Site: Right Upper Arm, Position: Sitting)   Pulse 82   Temp 98.2 F (36.8 C) (Temporal)   Resp 18   Ht '5\' 2"'$  (1.575 m)   Wt 173 lb (78.5 kg)   SpO2 94%   BMI 31.64 kg/m     Current Outpatient Medications   Medication Sig    gabapentin (NEURONTIN) 300 MG capsule TAKE 1 CAPSULE EVERY MORNING  AND TAKE 2 CAPSULES EVERY NIGHT    ZINC PO Take 1 tablet by mouth daily    albuterol sulfate HFA (PROVENTIL;VENTOLIN;PROAIR) 108 (90 Base) MCG/ACT inhaler Inhale 2 puffs into the lungs every 4 hours as needed    atorvastatin (LIPITOR) 40 MG tablet TAKE  1  TABLET  EVERY  DAY (NEED MD APPOINTMENT)    vitamin D3 (CHOLECALCIFEROL) 125 MCG (5000 UT) TABS tablet Take 1 tablet by mouth daily    iron dextran complex (INFED) 50 MG/ML injection Infuse 19.5 mLs intravenously    latanoprost (XALATAN) 0.005 % ophthalmic solution Apply 1 drop to eye    levothyroxine (SYNTHROID) 150 MCG tablet  TAKE 1 TABLET EVERY DAY BEFORE BREAKFAST (NEED MD APPOINTMENT)    lidocaine (LIDODERM) 5 % Place 1 patch onto the skin every 24 hours    pantoprazole (PROTONIX) 40 MG tablet Take 1 tablet by mouth daily    potassium chloride (KLOR-CON M) 10 MEQ extended release tablet TAKE 1 TABLET EVERY DAY    valsartan-hydroCHLOROthiazide (DIOVAN-HCT) 160-25 MG per tablet TAKE 1 TABLET EVERY DAY    cyanocobalamin 1000 MCG tablet Take 2.5 tablets by mouth daily    valsartan (DIOVAN) 80 MG tablet Take 1 tablet by mouth daily Hold if SBP less than 120     No current facility-administered medications for this visit.               An electronic signature was used to authenticate this note.    --Johnedward Brodrick Vear Clock, MD

## 2022-06-22 ENCOUNTER — Ambulatory Visit
Admit: 2022-06-22 | Discharge: 2022-06-22 | Payer: MEDICARE | Attending: Physical Medicine & Rehabilitation | Primary: Internal Medicine

## 2022-06-22 ENCOUNTER — Ambulatory Visit: Payer: MEDICARE | Primary: Internal Medicine

## 2022-06-22 ENCOUNTER — Inpatient Hospital Stay: Admit: 2022-06-22 | Payer: MEDICARE | Attending: Internal Medicine | Primary: Internal Medicine

## 2022-06-22 DIAGNOSIS — D500 Iron deficiency anemia secondary to blood loss (chronic): Secondary | ICD-10-CM

## 2022-06-22 DIAGNOSIS — M1612 Unilateral primary osteoarthritis, left hip: Secondary | ICD-10-CM

## 2022-06-22 LAB — POCT CREATININE AND GFR
POC Creatinine: 0.6 MG/DL (ref 0.6–1.3)
eGFR, POC: >60 mL/min/{1.73_m2} (ref >60)

## 2022-06-22 MED ORDER — DIATRIZOATE MEGLUMINE & SODIUM 66-10 % PO SOLN
66-10 % | Freq: Once | ORAL | Status: DC | PRN
Start: 2022-06-22 — End: 2022-06-26
  Administered 2022-06-22: 14:00:00 30 mL via ORAL

## 2022-06-22 MED ORDER — IOPAMIDOL 61 % IV SOLN
61 % | Freq: Once | INTRAVENOUS | Status: AC | PRN
Start: 2022-06-22 — End: 2022-06-22
  Administered 2022-06-22: 14:00:00 80 mL via INTRAVENOUS

## 2022-06-22 MED FILL — ISOVUE-300 61 % IV SOLN: 61 % | INTRAVENOUS | Qty: 80

## 2022-06-22 NOTE — Progress Notes (Signed)
Elaine [name] presents today for   Chief Complaint   Patient presents with    Hip Pain     Left        Is someone accompanying this pt? no    Is the patient using any DME equipment during [place]? Yes, cane     Depression Screening:  PHQ-9 Questionaire 06/15/2022 05/11/2022 03/23/2022 11/24/2021 10/20/2021 10/02/2021 10/01/2021   Little interest or pleasure in doing things 0 0 0 0 0 0 0   Feeling down, depressed, or hopeless 0 0 0 0 0 0 0   Trouble falling or staying asleep, or sleeping too much - - - - - - -   Feeling tired or having little energy - 0 - - - - -   Poor appetite or overeating - 0 - - - - -   Feeling bad about yourself - or that you are a failure or have let yourself or your family down - 0 - - - - -   Trouble concentrating on things, such as reading the newspaper or watching television - 0 - - - - -   Moving or speaking so slowly that other people could have noticed. Or the opposite - being so fidgety or restless that you have been moving around a lot more than usual - 0 - - - - -   Thoughts that you would be better off dead, or of hurting yourself in some way - 0 - - - - -   PHQ-9 Total Score 0 0 0 0 0 0 0   If you checked off any problems, how difficult have these problems made it for you to do your work, take care of things at home, or get along with other people? - 0 - - - - -     PHQ Scores 06/15/2022 05/11/2022 03/23/2022 11/24/2021 10/20/2021 10/02/2021 10/01/2021   PHQ2 Score 0 0 0 0 0 0 0   PHQ2 Score - - - - - - -   PHQ9 Score 0 0 0 0 0 0 0   PHQ9 Score - - - - - - -       Abuse Screening:  AMB Abuse Screening 06/15/2022   Do you ever feel afraid of your partner? N   Are you in a relationship with someone who physically or mentally threatens you? N   Is it safe for you to go home? Y       Coordination of Care:  1. Have you been to the ER, urgent care clinic since your last visit? no  Hospitalized since your last visit? No, pt had an outpt enteroscopy     2. Have you seen or consulted any other health  care providers outside of the [place] since your last visit? Yes, pcp, cardiology Include any pap smears or colon screening. no

## 2022-06-22 NOTE — Progress Notes (Signed)
[facility]  [address] [place] [address]   Phone: [phone]   Fax: [phone]         Patient: Elaine [name]                                                                               MRN: [id number]         Date of Birth: 1951/11/11           AGE: 71 y.o.               PCP: Thana Farr, MD        Reason for Consultation: Back Pain (Lower) and Hip Pain (Left/)         HPI:   Elaine [name] is a 71 y.o. female with relevant PMH of  C3-5 fusion in 2010, HTN, HLD  who presented with chronic low back pain which has been presents with over 30 years of intermittent which has  become constant over the past few months.  She was previously seen by pain management many years ago and tried epidural injections which helped for a period of time but then she decided to stop getting them. Her last MRI 2011 demonstrated a left L3/4  disc protrusion with moderate left foraminal stenosis, L5/S1 severe bilateral foraminal stenosis.  She started a course of PT  for her low back         More recently she saw Dr. Garner Nash with left hip pain 10/07/2021. [name]  She saw Dr. Garner Nash and tried fluoro guided hip injections 10/21/21 which helped for about 1 month. She has decided to have her hip replaced and was scheduled 06/17/22 but due to anemia from chronic blood loss she was not cleared for surgery and is undergoing work up for GI bleed      Works at [employer], had taken off since October when hip pain started but retrned 12/09/21         Neurologic symptoms: No numbness, tingling, weakness, bowel or bladder changes.  No recent falls        Location: The pain is located in the low back pain , left groin   Radiation: The pain does not radiate.     Pain Score: Currently: 4/10    Quality: Pain is of a Stabbing quality.     Aggravating: Pain is exacerbated by walking and standing   Alleviating: The pain is alleviated by lying down, sitting      Prior Treatments:    Physical therapy:   Completed  PT- [facility] [employer]   Injections:YES-   ESI-  helped a bit but stopped several years ago    11/15//2022 Left hip injection fluoro guided reduced pain 9/10- 6/10    Chiropractic treatments- years ago which helped    Previous Medications:    Current Medications: tylenol , gabapentin '300mg'$  in am, '600mg'$  at night    Previous work-up has included:    Bone density 2016- wnl   X-ray left hip and pelvis-09/2021   No fracture or dislocation. Mild degenerative changes of the bilateral hips and   sacroiliac joints.  Sclerotic focus at the right femoral neck appears similar compared to prior       MRI cervical spine 2016   No enhancing abnormalities.   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 No associated hydrocephalus, or cord syrinx.       Postoperative changes after anterior plating and fusion, C3-C5.    Degenerative spondylosis, C5/C6 and C6/C7, without significant alterations, all other is an element of mild central canal stenosis on a multifactorial basis, C5-C6.       Central canal contents, and paravertebral soft tissues otherwise unremarkable.          MRI lumbar spine 2011   L1-L2: Unremarkable.   L2-L3: Also unremarkable.   L3-L4: Posterior disc bulge and left postero lateral corner   protrusion. AP central canal is more than 10 mm.  No deformity of the  thecal sac. Mild facet and ligamentous hypertrophy. Mild right  foraminal stenosis with mild indentation of exiting nerve root from  posterior disc bulge. On the left, moderate left foraminal stenosis   with more compression of  the left exiting L3 nerve root from disc  protrusion. Slightly more severe left facet hypertrophy and  ligamentous hypertrophy as well.  Overall, grossly stable since last  MRI   L4-L5: Posterior disc bulge with mild central stenosis. AP central   canal measures 9 mm. More severe facet arthropathy with moderate  right facet joint effusion which is new. No significant right  foraminal  stenosis. Mild to moderate left foraminal stenosis with  mild impingement of the left exiting L4 nerve root from  posterior   disc bulge. Facet joint effusion also present on the left.  Slightly  more severe than before.   L5-S1: Left posterolateral disc protrusion, indenting thecal sac, likely S1 root on the left. Facet hypertrophy also present. Severe   bilateral foraminal stenosis with impingement of bilateral L5   exiting nerve root.  Slightly more severe than before   IMPRESSION: Stable to mild progression of left-sided disease in the  lower  lumbar levels as above.     Past Medical History:      Past Medical History:   Diagnosis Date    Anemia     Asthma     Colon polyp     DDD (degenerative disc disease) 02/15/2009    Depression 12/18/2011    DVT (deep venous thrombosis) ([place]) 10/23/2009    Left leg:  No DVT.      Essential hypertension, benign     History of echocardiogram 03/20/2004    EF 70%.  Borderline DDfx.  No significant valvular pathology.    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  Neg EKG on pharm stress test.    Thyroid disease     hypothyroidism  Past Surgical History:   Procedure Laterality Date    COLONOSCOPY N/A 04/25/2020    SCREENING COLONOSCOPY performed by Sloan Leiter, MD at [facility] ENDOSCOPY    COLONOSCOPY  02-24-12    normal (Dr. Angelique Holm)    COLONOSCOPY FLX DX W/COLLJ [facility]  PFRMD  02-2007    +polyp(tubular adenoma); Dr Angelique Holm    HERNIA REPAIR      3x    HYSTERECTOMY ([place])      NEUROLOGICAL SURGERY  03-16-2009    s/p ACD & Fusion (Dr.P.Gurtner)    TUBAL LIGATION      UPPER GASTROINTESTINAL ENDOSCOPY N/A 06/12/2022    ENTEROSCOPY PUSH DIAGNOSTIC w/small intestinal bipolar cautery of multiple AVMs performed by Sloan Leiter, MD at [facility] ENDOSCOPY      Social History     Tobacco Use   Smoking Status Former    Packs/day: 1.00    Years: 27.00    Pack years: 27.00    Types: Cigarettes    Start date: 10/16/1977    Quit date: 04/25/2004    Years since quitting: 18.1    Passive exposure: Never   Smokeless Tobacco Never      Current Outpatient Medications   Medication Sig Dispense Refill    valsartan (DIOVAN) 80 MG tablet Take 1 tablet by mouth daily Hold if SBP less than 120 90 tablet 1    gabapentin (NEURONTIN) 300 MG capsule TAKE 1 CAPSULE EVERY MORNING  AND TAKE 2 CAPSULES EVERY NIGHT 270 capsule 1    ZINC PO Take 1 tablet by mouth daily      albuterol sulfate HFA (PROVENTIL;VENTOLIN;PROAIR) 108 (90 Base) MCG/ACT inhaler Inhale 2 puffs into the lungs every 4 hours as needed      vitamin D3 (CHOLECALCIFEROL) 125 MCG (5000 UT) TABS tablet Take 1 tablet by mouth daily      iron dextran complex (INFED) 50 MG/ML injection Infuse 19.5 mLs intravenously Pt gets these every other month      latanoprost (XALATAN) 0.005 % ophthalmic solution Place 1 drop into both eyes nightly      levothyroxine (SYNTHROID) 150 MCG tablet TAKE 1 TABLET EVERY DAY BEFORE BREAKFAST (NEED MD APPOINTMENT)      lidocaine (LIDODERM) 5 % Place 1 patch onto the skin every 24 hours      pantoprazole (PROTONIX) 40 MG tablet Take 1 tablet by mouth daily      potassium chloride (KLOR-CON M) 10 MEQ extended release tablet TAKE 1 TABLET EVERY DAY      cyanocobalamin 1000 MCG tablet Take 2.5 tablets by mouth daily      atorvastatin (LIPITOR) 40 MG tablet TAKE  1  TABLET  EVERY  DAY (NEED MD APPOINTMENT)       valsartan-hydroCHLOROthiazide (DIOVAN-HCT) 160-25 MG per tablet TAKE 1 TABLET EVERY DAY (Patient not taking: Reported on 06/22/2022)       No current facility-administered medications for this visit.     Facility-Administered Medications Ordered in Other Visits   Medication Dose Route Frequency Provider Last Rate Last Admin    diatrizoate meglumine-sodium (GASTROGRAFIN) 66-10 % solution 30 mL  30 mL Oral ONCE PRN Thana Farr, MD   30 mL at 06/22/22 0956      Allergies   Allergen Reactions    Aspirin Other (See Comments)     GI distress,     Diphenhydramine Other (See Comments)     Muscle jerking    Pollen Extract      Other  reaction(s): Not Reported This Time     Physical Exam      General: ??????? Well nourished and well developed female without any acute distress    Psychiatric: ?  Alert and oriented x 3 with normal mood     HEENT: ????????  Atraumatic    Respiratory:   Breathing non-labored and non dyspneic    CV: ???????????????? Peripheral pulses intact, no peripheral edema    Skin: ?????????????  No rashes          Neurologic: ??        Sensation: normal and grossly intact thebilateral, lower extremity(s)    Strength: 5/5 in the bilateral, lower extremity(s) - 4/5 strength left hip flexion -no pain   Reflexes: reveals 2+ symmetric DTRs throughout     Gait: normal       Musculoskeletal: Lumbar Exam       Inspection:    Alignment: Normal   Atrophy: None          Tenderness to Palpation:    Lumbar paraspinals Positive   Lumbar spinous processes Negative   SI Joint:  Negative   Gluteal:Negative   Greater trochanter: Positive b/l         ROM:    Lumbar ROM: Normal   Lumbar facet loading: Positive   Hip ROM: No reproduction of pain with movement seated      Special Tests        Slump test: Negative    SLR: Negative   FABER: Negative   FADIR: Negative   Log Roll: Negative         Medical Decision Making:     Images: The imaging results    Labs:  The results below were reviewed.       X-ray left hip reviewed as above-  mild hip OA         Assessment:    -lumbar spondylosis/- lumbar facet arthritis   - left hip pain- OA            Plan:        -Physical therapy -NA   -Medications - lidocaine patch. Counseled regarding side effects and appropriate administration of medications.     -Diagnostics/Imaging -    -Injections -Discussed repeating hip injection but she wants to proceed with hip replacement once she is medically cleared   -Lifestyle - Encouraged regular aerobic exercise    -Education - The patient's diagnosis, prognosis and treatment options were discussed today. All questions were answered.    F/U - f/u prn         [facility]             ?

## 2022-06-24 NOTE — Telephone Encounter (Signed)
CT not read as of this Morning

## 2022-06-24 NOTE — Telephone Encounter (Signed)
Patient's daughter called with patient present, states that patient recently had a CT scan done on Monday and they were calling to check on the results, states patient is having new symptoms of bladder incontinence that started yesterday and they were concerned.    They are requesting a return call, and are hopeful to be seen this week if needed, no appts available.  Patient can be reached at [phone].  Please advise, thank you.

## 2022-06-26 NOTE — Telephone Encounter (Signed)
Spoke with CT department. Tech states they are running behind getting the studies read.

## 2022-06-30 ENCOUNTER — Ambulatory Visit: Payer: MEDICARE | Attending: Orthopaedic Surgery | Primary: Internal Medicine

## 2022-06-30 NOTE — Telephone Encounter (Signed)
Patient is aware.  Appointment rescheduled for 07/06/2022 at 3pm.  Patient states she will be out of town this weekend and will return on Monday.

## 2022-06-30 NOTE — Telephone Encounter (Signed)
Let pt know results from CT abd just came back yesterday. Nothing critical but there are multiple concerns which I need to review with her in person on Monday when she comes in.

## 2022-06-30 NOTE — Telephone Encounter (Signed)
Results now in chart.

## 2022-07-03 NOTE — Telephone Encounter (Signed)
Patient was called regarding her blood pressure log. Left a message for patient to call the office so we can give her instruction. Per Dr. Lyndel Safe Increase Diovan 80 to 160 daily and continue to monitor blood pressure for 4 days.

## 2022-07-06 ENCOUNTER — Ambulatory Visit: Admit: 2022-07-06 | Discharge: 2022-07-06 | Payer: MEDICARE | Attending: Internal Medicine | Primary: Internal Medicine

## 2022-07-06 ENCOUNTER — Encounter: Payer: MEDICARE | Attending: Internal Medicine | Primary: Internal Medicine

## 2022-07-06 DIAGNOSIS — M109 Gout, unspecified: Secondary | ICD-10-CM | POA: Diagnosis not present

## 2022-07-06 DIAGNOSIS — N183 Chronic kidney disease, stage 3 unspecified: Secondary | ICD-10-CM | POA: Diagnosis not present

## 2022-07-06 DIAGNOSIS — E785 Hyperlipidemia, unspecified: Secondary | ICD-10-CM | POA: Diagnosis not present

## 2022-07-06 DIAGNOSIS — K219 Gastro-esophageal reflux disease without esophagitis: Secondary | ICD-10-CM | POA: Diagnosis not present

## 2022-07-06 DIAGNOSIS — E1122 Type 2 diabetes mellitus with diabetic chronic kidney disease: Secondary | ICD-10-CM | POA: Diagnosis not present

## 2022-07-06 DIAGNOSIS — E669 Obesity, unspecified: Secondary | ICD-10-CM | POA: Diagnosis not present

## 2022-07-06 DIAGNOSIS — D649 Anemia, unspecified: Secondary | ICD-10-CM | POA: Diagnosis not present

## 2022-07-06 DIAGNOSIS — I10 Essential (primary) hypertension: Secondary | ICD-10-CM | POA: Diagnosis not present

## 2022-07-06 DIAGNOSIS — N393 Stress incontinence (female) (male): Secondary | ICD-10-CM

## 2022-07-06 NOTE — Progress Notes (Signed)
Elaine [name] (DOB:  01/21/1951) is a 71 y.o. female established patient, here for evaluation of the following chief complaint(s):  Incontinence (Bowel and bladder)         ASSESSMENT/PLAN:    ICD-10-CM    1. Essential (primary) hypertension  I10 Well-controlled Diovan 80 mg daily      2. Stress incontinence of urine  N39.3 Ambulatory referral to Urology      3. Right renal atrophy  N26.1 Ambulatory referral to Urology     Vascular duplex renal arterial complete      4. Renal artery stenosis (HCC)  I70.1 Vascular duplex renal arterial complete             Return if symptoms worsen or fail to improve.         Subjective   SUBJECTIVE/OBJECTIVE:  Patient has a history of AVMs which again was confirmed on EGD that was done June 12, 2022 by [facility].  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  This is thought to be the cause of her chronic anemia which worsened recently.  Patient had an iron infusion with her hematologist Dr. Edd Arbour who will follow-up with her for recurrent infusion as needed.  During this episode of severe fatigue and anemia patient had episodes of loose stools and stool incontinence as well as urinary incontinence.  Now that she is feeling stronger the stool incontinence has stopped and the stools are more solid.  She still has some intermittent urinary incontinence.  She did have a CT of her abdomen 06/15/2022 that showed multiple abnormalities.  She has mild global right renal atrophy compared to the left without hydronephrosis.  Likely due to chronic vascular compromise.  Patient does have a long history of tobacco use so we will go ahead and do renal duplex Doppler and refer patient to urology and eventually to vascular surgery if needed.  Due to her urinary incontinence and multiple small nonobstructing right renal calculi patient is being referred to urology.  She has several tiny ventral near midline abdominal wall hernia defects but she has no pain from these and therefore no  further management is needed at this time.  She has possible hepatic cysts and extensive arteriosclerotic calcification.  Discussed with her daughter who is with her today that we will go ahead and do testing as above.  Patient high blood pressure is controlled on Diovan 80 mg daily.  Blood pressure became better controlled when patient lost weight, about 30 pounds.  This is the reason the CT abdomen was done to rule out any other underlying malignant pathology.  If patient regains weight may need to adjust blood pressure medications again.    Respiratory ROS: negative for - shortness of breath  Cardiovascular ROS: negative for - chest pain       Objective   BP (!) 111/47 (Site: Left Upper Arm, Position: Sitting, Cuff Size: Large Adult)   Pulse 93   Temp 98.5 F (36.9 C) (Temporal)   Resp 20   Ht '5\' 2"'$  (1.575 m)   Wt 172 lb (78 kg)   SpO2 98%   BMI 31.46 kg/m   Chest - clear to auscultation, no wheezes, rales or rhonchi, symmetric air entry  Heart - normal rate, regular rhythm, normal S1, S2, no murmurs, rubs, clicks or gallops  Extremities - no pedal edema noted       Current Outpatient Medications   Medication Sig    cyanocobalamin 1000 MCG  tablet Take 2.5 tablets by mouth daily    valsartan (DIOVAN) 80 MG tablet Take 1 tablet by mouth daily Hold if SBP less than 120    gabapentin (NEURONTIN) 300 MG capsule TAKE 1 CAPSULE EVERY MORNING  AND TAKE 2 CAPSULES EVERY NIGHT    ZINC PO Take 1 tablet by mouth daily    albuterol sulfate HFA (PROVENTIL;VENTOLIN;PROAIR) 108 (90 Base) MCG/ACT inhaler Inhale 2 puffs into the lungs every 4 hours as needed    atorvastatin (LIPITOR) 40 MG tablet TAKE  1  TABLET  EVERY  DAY (NEED MD APPOINTMENT)    vitamin D3 (CHOLECALCIFEROL) 125 MCG (5000 UT) TABS tablet Take 1 tablet by mouth daily    iron dextran complex (INFED) 50 MG/ML injection Infuse 19.5 mLs intravenously Pt gets these every other month    latanoprost (XALATAN) 0.005 % ophthalmic solution Place 1 drop into both  eyes nightly    levothyroxine (SYNTHROID) 150 MCG tablet TAKE 1 TABLET EVERY DAY BEFORE BREAKFAST (NEED MD APPOINTMENT)    lidocaine (LIDODERM) 5 % Place 1 patch onto the skin every 24 hours    pantoprazole (PROTONIX) 40 MG tablet Take 1 tablet by mouth daily    potassium chloride (KLOR-CON M) 10 MEQ extended release tablet TAKE 1 TABLET EVERY DAY    valsartan-hydroCHLOROthiazide (DIOVAN-HCT) 160-25 MG per tablet TAKE 1 TABLET EVERY DAY (Patient not taking: Reported on 06/22/2022)     No current facility-administered medications for this visit.             An electronic signature was used to authenticate this note.    --Han Lysne Vear Clock, MD

## 2022-07-06 NOTE — Progress Notes (Signed)
Elaine [name] presents today for   Chief Complaint   Patient presents with    Incontinence     Bowel and bladder       Patient states she has been having bowel and bladder incontinence for a few weeks.   Patient states she has not fallen or started any new meds.       1. "Have you been to the ER, urgent care clinic since your last visit?  Hospitalized since your last visit?" no    2. "Have you seen or consulted any other health care providers outside of the [facility] since your last visit?" no     3. For patients aged 50-75: Has the patient had a colonoscopy / FIT/ Cologuard? NA - based on age      If the patient is female:    4. For patients aged 109-74: Has the patient had a mammogram within the past 2 years? NA - based on age or sex      70. For patients aged 21-65: Has the patient had a pap smear? NA - based on age or sex

## 2022-07-08 NOTE — Telephone Encounter (Signed)
-----   Message from Altamese Cabal sent at 07/08/2022  2:56 PM EDT -----  Subject: Message to Provider    QUESTIONS  Information for Provider? Patient was referred to kidney specialist by   provider. They cant see her until October. Pt wanted to know if she should   see someone else or what. Please call pt back.   ---------------------------------------------------------------------------  --------------  Rod Can INFO  [phone]; OK to leave message on voicemail  ---------------------------------------------------------------------------  --------------  SCRIPT ANSWERS  Relationship to Patient? Self

## 2022-07-09 NOTE — Telephone Encounter (Signed)
Please advise if ok for patient to wait until October for nephrology appointment.

## 2022-07-09 NOTE — Telephone Encounter (Signed)
Ok to keep the urology appointment for October. Its the normal waiting time for this speciality. The most important thing is doing the test for the circulation to the kidneys.

## 2022-07-10 NOTE — Telephone Encounter (Signed)
Patient's mailbox is unable to leave message below.     Ok to keep the urology appointment for October. Its the normal waiting time for this speciality. The most important thing is doing the test for the circulation to the kidneys.

## 2022-07-10 NOTE — Telephone Encounter (Signed)
Patient is aware of message and necessity of getting the Vascular duplex done.   Patient was given  the phone number for Central scheduling to call and schedule test.

## 2022-07-14 DIAGNOSIS — D649 Anemia, unspecified: Secondary | ICD-10-CM | POA: Diagnosis not present

## 2022-07-21 ENCOUNTER — Inpatient Hospital Stay: Admit: 2022-07-21 | Payer: MEDICARE | Attending: Internal Medicine | Primary: Internal Medicine

## 2022-07-21 DIAGNOSIS — N261 Atrophy of kidney (terminal): Secondary | ICD-10-CM

## 2022-07-21 LAB — VAS RENAL ARTERIAL DUP COMPLETE
Ao mid PSV: 129.3 cm/s
L renal orig RI: 0.78 no units
Left Interlobar EDV: 12 cm/s
Left Interlobar PSV: 47.1 cm/s
Left Interlobar RI: 0.75
Left Renal Dist RI: 0.8 no units
Left Renal Mid RI: 0.82 no units
Left Renal Prox RI: 0.8 no units
Left Renal RAR: 1.65
Left Renal Segmental Accel Time: 0.11 s
Left Segmental Renal Artery EDV: 10.9 cm/s
Left Segmental Renal Artery PSV: 30.6 cm/s
Left Segmental Renal Artery RI: 0.64
Left kidney length: 10.15 cm
Left kidney width: 6.02 cm
Left renal dist EDV: 32.2 cm/s
Left renal dist PSV: 157.5 cm/s
Left renal dist RAR: 1.22
Left renal mid EDV: 38.3 cm/s
Left renal mid PSV: 213.8 cm/s
Left renal mid RAR: 1.65
Left renal middle parenchyma EDV: 10.9 cm/s
Left renal middle parenchyma PSV: 30.6 cm/s
Left renal middle parenchyma RI: 0.64
Left renal origin EDV: 33.4 cm/s
Left renal origin PSV: 155.3 cm/s
Left renal origin RAR: 1.2
Left renal prox EDV: 22.5 cm/s
Left renal prox PSV: 114.9 cm/s
Left renal prox RAR: 0.89
Right Interlobar EDV: 11.9 cm/s
Right Interlobar PSV: 29.3 cm/s
Right Interlobar RI: 0.59
Right Renal Dist RI: 0.69 no units
Right Renal Mid RI: 0.76
Right Renal Origin RI: 0.78
Right Renal Prox RI: 0.75 no units
Right Renal RAR: 0.84
Right Renal Segmental Accel Time: 0 s
Right kidney length: 7.93 cm
Right kidney width: 4.25 cm
Right renal dist EDV: 13.3 cm/s
Right renal dist PSV: 42.7 cm/s
Right renal dist RAR: 0.33
Right renal mid EDV: 17 cm/s
Right renal mid PSV: 71.5 cm/s
Right renal mid RAR: 0.55
Right renal middle parenchyma EDV: 7.6 cm/s
Right renal middle parenchyma PSV: 27.3 cm/s
Right renal middle parenchyma RI: 0.72
Right renal origin EDV: 24 cm/s
Right renal origin PSV: 108.6 cm/s
Right renal origin RAR: 0.84
Right renal prox EDV: 23.9 cm/s
Right renal prox PSV: 96.4 cm/s
Right renal prox RAR: 0.75

## 2022-07-29 ENCOUNTER — Ambulatory Visit: Admit: 2022-07-29 | Discharge: 2022-07-29 | Payer: MEDICARE | Attending: Internal Medicine | Primary: Internal Medicine

## 2022-07-29 DIAGNOSIS — I10 Essential (primary) hypertension: Secondary | ICD-10-CM

## 2022-07-29 MED ORDER — VALSARTAN 160 MG PO TABS
160 | ORAL_TABLET | Freq: Every day | ORAL | 3 refills | Status: DC
Start: 2022-07-29 — End: 2022-10-06

## 2022-07-29 NOTE — Progress Notes (Signed)
Elaine [name] is a 71 y.o.  female and presents with Hypertension, Cholesterol Problem, Thyroid Problem (/), and Vitamin D Deficiency      SUBJECTIVE:  [name] HTN is uncontrolled on   Diovan 80 mg.  [name] high cholesterol has been borderline controlled on Lipitor 40 mg. Pt  denies any side effects like myalgias. Compliance with medications has been an issue.  Pt has been controlling her prediabetes with diet and weight loss.      Thyroid Review:  Patient is seen for followup of hypothyroidism.   Thyroid ROS: denies fatigue, weight changes, heat/cold intolerance, bowel/skin changes or CVS symptoms. 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  Her TSH  is low she is now on Synthroid 150 mcg daily 6x/week.      [name] vit D level is high so she will decrease from 2000 to 100 units/day. She is not sure about her exact dose  Patient has sleep apnea and is using a CPAP machine and followed by sleep specialist    She continues to have fungal infection in her toenails which is slowly improving with Lamisil.     Patient has asthma that is controlled on Singulair and  trelegy Ellipta.  Patient is followed by pulmonary who also sees her for sleep apnea.      Patient has chronic diastolic heart failure which is stable and followed by cardiology    Patient has an atrophic right kidney which may have been going on for several years if not since birth.  Duplex renal ultrasound does not show any stenosis so patient can nonemergently follow-up with urology for further recommendations.    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  She has had EGD/colonoscopy and pill endoscopy in the past    Respiratory ROS: negative for - shortness of breath  Cardiovascular ROS: negative for - chest pain      Current Outpatient Medications    Medication Sig    valsartan (DIOVAN) 160 MG tablet Take 1 tablet by mouth daily    cyanocobalamin 1000 MCG tablet Take 2.5 tablets by mouth daily    ZINC PO Take 1 tablet by mouth daily    albuterol sulfate HFA (PROVENTIL;VENTOLIN;PROAIR) 108 (90 Base) MCG/ACT inhaler Inhale 2 puffs into the lungs every 4 hours as needed    atorvastatin (LIPITOR) 40 MG tablet TAKE  1  TABLET  EVERY  DAY (NEED MD APPOINTMENT)    vitamin D3 (CHOLECALCIFEROL) 125 MCG (5000 UT) TABS tablet Take 1 tablet by mouth daily    iron dextran complex (INFED) 50 MG/ML injection Infuse 19.5 mLs intravenously Pt gets these every other month    latanoprost (XALATAN) 0.005 % ophthalmic solution Place 1 drop into both eyes nightly    levothyroxine (SYNTHROID) 150 MCG tablet TAKE 1 TABLET EVERY DAY BEFORE BREAKFAST (NEED MD APPOINTMENT)    lidocaine (LIDODERM) 5 % Place 1 patch onto the skin every 24 hours    pantoprazole (PROTONIX) 40 MG tablet Take 1 tablet by mouth daily    potassium chloride (KLOR-CON M) 10 MEQ extended release tablet TAKE 1 TABLET EVERY DAY    gabapentin (NEURONTIN) 300 MG capsule TAKE 1 CAPSULE EVERY MORNING  AND TAKE 2 CAPSULES EVERY NIGHT     No current facility-administered medications for this visit.  OBJECTIVE:  alert, well appearing, and in no distress    Visit Vitals  BP (!) 149/64 (Site: Left Upper Arm, Position: Sitting, Cuff Size: Large Adult)   Pulse 84   Temp 97.9 F (36.6 C) (Temporal)   Resp 16   Ht '5\' 2"'$  (1.575 m)   Wt 168 lb (76.2 kg)   SpO2 97%   BMI 30.73 kg/m          well developed and well nourished  Chest - clear to auscultation, no wheezes, rales or rhonchi, symmetric air entry  Heart - normal rate, regular rhythm, normal S1, S2, no murmurs, rubs, clicks or gallops  Extremities - peripheral pulses normal, no pedal edema, no clubbing or cyanosis    CMP:    Lab Results   Component Value Date/Time    NA 142 03/23/2022 12:00 AM    K 3.4 03/23/2022 12:00 AM    CL 100 03/23/2022 12:00 AM    CO2 26  03/23/2022 12:00 AM    BUN 6 03/23/2022 12:00 AM    CREATININE 0.6 06/22/2022 09:54 AM    CREATININE 0.67 03/23/2022 12:00 AM    GFRAA >60 05/26/2021 08:05 AM    AGRATIO 1.8 03/23/2022 12:00 AM    LABGLOM 93 03/23/2022 12:00 AM    GLUCOSE 150 03/23/2022 12:00 AM    PROT 6.5 03/23/2022 12:00 AM    LABALBU 4.2 03/23/2022 12:00 AM    CALCIUM 9.4 03/23/2022 12:00 AM    BILITOT 0.4 03/23/2022 12:00 AM    ALKPHOS 84 03/23/2022 12:00 AM    AST 17 03/23/2022 12:00 AM    ALT 11 03/23/2022 12:00 AM     HgBA1c:    Lab Results   Component Value Date/Time    LABA1C 5.5 11/17/2021 03:46 AM     FLP:    Lab Results   Component Value Date/Time    TRIG 102 03/23/2022 12:00 AM    HDL 54 03/23/2022 12:00 AM    LDLCALC 97 03/23/2022 12:00 AM    LABVLDL 19 03/23/2022 12:00 AM     TSH:    Lab Results   Component Value Date/Time    TSH 7.650 03/23/2022 12:00 AM         Discussed the patient's BMI with her.  The BMI follow up plan is as follows: I have counseled this patient on diet and exercise regimens.        Assessment/Plan      ICD-10-CM    1. Essential (primary) hypertension  I10 Uncontrolled will increase to valsartan (DIOVAN) 160 MG tablet     Comprehensive Metabolic Panel      2. Pure hypercholesterolemia, unspecified  E78.00 Borderline controlled Lipitor 40 mg daily.  May consider adding Zetia if patient unable to get LDL under 70 lipid Panel      3. Postprocedural hypothyroidism  E89.0 Patient currently on Synthroid 150 mcg 6x/week we will recheck TSH + Free T4 Panel levels and adjust as needed      4. Prediabetes  R73.03 Controlled currently diet Hemoglobin A1C      5. Right renal atrophy  N26.1 Patient will follow up with urology for further recommendations but circulation to kidneys shows no stenosis currently      6. Iron deficiency anemia due to chronic blood loss  D50.0 Due to history of AVMs.  Patient managed by hematology and GI CBC with Auto Differential      7. Chronic diastolic congestive heart failure ([place])  I50.32  Patient  stable on current medications and is followed by cardiology      8. Vitamin D deficiency  E55.9 Stable on current supplementation.  Vitamin D 25 Hydroxy           Follow-up and Dispositions    Ms.Return in about 2 months (around 10/05/2022) for labs 1 week before.             Reviewed plan of care. Patient has provided input and agrees with goals.

## 2022-07-29 NOTE — Progress Notes (Signed)
Elaine [name] presents today for   Chief Complaint   Patient presents with    Results     Review renal test results             1. "Have you been to the ER, urgent care clinic since your last visit?  Hospitalized since your last visit?" no    2. "Have you seen or consulted any other health care providers outside of the [facility] since your last visit?" no     3. For patients aged 71-75: Has the patient had a colonoscopy / FIT/ Cologuard? Yes - no Care Gap present      If the patient is female:    4. For patients aged 22-74: Has the patient had a mammogram within the past 2 years? Yes - no Care Gap present      5. For patients aged 21-65: Has the patient had a pap smear? NA - based on age or sex

## 2022-08-06 DIAGNOSIS — E669 Obesity, unspecified: Secondary | ICD-10-CM | POA: Diagnosis not present

## 2022-08-06 DIAGNOSIS — I10 Essential (primary) hypertension: Secondary | ICD-10-CM | POA: Diagnosis not present

## 2022-08-11 ENCOUNTER — Ambulatory Visit: Payer: PRIVATE HEALTH INSURANCE | Attending: Orthopaedic Surgery | Primary: Internal Medicine

## 2022-08-28 ENCOUNTER — Ambulatory Visit
Admit: 2022-08-28 | Discharge: 2022-08-28 | Payer: MEDICARE | Attending: Orthopaedic Surgery | Primary: Internal Medicine

## 2022-08-28 VITALS — Resp 18 | Ht 62.0 in

## 2022-08-28 DIAGNOSIS — M19012 Primary osteoarthritis, left shoulder: Principal | ICD-10-CM

## 2022-08-28 MED ORDER — TRIAMCINOLONE ACETONIDE 40 MG/ML IJ SUSP
40 MG/ML | Freq: Once | INTRAMUSCULAR | Status: AC
Start: 2022-08-28 — End: 2022-08-28
  Administered 2022-08-28: 15:00:00 40 mg

## 2022-08-28 NOTE — Progress Notes (Signed)
[facility] AMBULATORY OFFICE NOTE      Patient: Elaine [name]                MRN: [id number]       SSN: [id number]  Date of Birth: 11/16/1951        AGE: 71 y.o.        SEX: female  Body mass index is 30.73 kg/m.    PCP: NOEL Vear Clock, MD  08/28/22    CHIEF COMPLAINT: Left shoulder pain    HPI: Elaine [name] is a 71 y.o. female patient who complains of 6 months of left shoulder pain.  No injury or trauma.  The pain onset has been insidious.  She notes decreased range of motion along with the pain.  She has difficulty raising her arm above shoulder height or behind the back.  She cannot take anti-inflammatories due to history of stomach issues.  She is currently taking Tylenol.  No injections.  No advanced imaging.    Past Medical History:   Diagnosis Date    Anemia     Asthma     Colon polyp     DDD (degenerative disc disease) 02/15/2009    Depression 12/18/2011    DVT (deep venous thrombosis) ([place]) 10/23/2009    Left leg:  No DVT.      Essential hypertension, benign     History of echocardiogram 03/20/2004    EF 70%.  Borderline DDfx.  No significant valvular pathology.    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  Neg EKG on pharm stress test.    Thyroid disease     hypothyroidism       Family History   Problem Relation Age of Onset    Hypertension Sister         x3     Hypertension Mother     Cancer Mother     Heart Surgery Sister     Heart Disease Sister     Hypertension Paternal Uncle     Cancer Maternal Aunt         breast    Breast Cancer Maternal Aunt     Glaucoma Son     Heart Defect Son     Diabetes Paternal Uncle     Heart Attack Sister        Current Outpatient Medications   Medication Sig Dispense Refill    valsartan (DIOVAN) 160 MG tablet Take 1 tablet by mouth daily 90 tablet 3    cyanocobalamin 1000 MCG tablet Take 2.5 tablets by mouth daily      gabapentin (NEURONTIN) 300 MG capsule TAKE 1 CAPSULE EVERY MORNING  AND TAKE 2 CAPSULES EVERY NIGHT 270 capsule 1    ZINC PO  Take 1 tablet by mouth daily      albuterol sulfate HFA (PROVENTIL;VENTOLIN;PROAIR) 108 (90 Base) MCG/ACT inhaler Inhale 2 puffs into the lungs every 4 hours as needed      atorvastatin (LIPITOR) 40 MG tablet TAKE  1  TABLET  EVERY  DAY (NEED MD APPOINTMENT)      vitamin D3 (CHOLECALCIFEROL) 125 MCG (5000 UT) TABS tablet Take 1 tablet by mouth daily      iron dextran complex (INFED) 50 MG/ML injection Infuse 19.5 mLs intravenously Pt gets these every other month      latanoprost (XALATAN) 0.005 % ophthalmic solution Place 1 drop into both eyes nightly      levothyroxine (SYNTHROID) 150 MCG tablet TAKE 1 TABLET EVERY DAY BEFORE BREAKFAST (NEED MD APPOINTMENT)      lidocaine (LIDODERM) 5 % Place 1 patch onto the skin every 24 hours      pantoprazole (PROTONIX) 40 MG tablet Take 1 tablet by mouth daily      potassium chloride (KLOR-CON M) 10 MEQ extended release tablet TAKE 1 TABLET EVERY DAY       Current Facility-Administered Medications   Medication Dose Route Frequency Provider Last Rate Last Admin    triamcinolone acetonide (KENALOG-40) injection 40 mg  40 mg Other Once Waynetta Pean, MD           Allergies   Allergen Reactions    Aspirin Other (See Comments)     GI distress,     Diphenhydramine Other (See Comments)     Muscle jerking    Pollen Extract      Other reaction(s): Not Reported This Time        Past Surgical History:   Procedure Laterality Date    COLONOSCOPY N/A 04/25/2020    SCREENING COLONOSCOPY performed by Sloan Leiter, MD at [facility] ENDOSCOPY    COLONOSCOPY  02-24-12    normal (Dr. Angelique Holm)    COLONOSCOPY FLX DX W/COLLJ [facility] WHEN PFRMD  02-2007    +polyp(tubular adenoma); Dr Angelique Holm    HERNIA REPAIR      3x    HYSTERECTOMY ([place])      NEUROLOGICAL SURGERY  03-16-2009    s/p ACD & Fusion (Dr.P.Gurtner)    TUBAL LIGATION      UPPER GASTROINTESTINAL ENDOSCOPY N/A 06/12/2022    ENTEROSCOPY PUSH DIAGNOSTIC w/small intestinal bipolar cautery of multiple AVMs performed by Sloan Leiter, MD at [facility] ENDOSCOPY       Social History     Socioeconomic History    Marital status: Divorced     Spouse name: Not on file    Number of children: Not on file    Years of education: Not on file    Highest education level: Not on file   Occupational History    Not on file   Tobacco Use    Smoking status: Former     Packs/day: 1.00     Years: 27.00     Additional pack years: 0.00     Total pack years: 27.00     Types: Cigarettes     Start date: 10/16/1977     Quit date: 04/25/2004     Years since quitting: 18.3     Passive exposure: Never    Smokeless tobacco: Never   Substance and Sexual Activity    Alcohol use: No    Drug use: No    Sexual activity: Not Currently   Other Topics Concern    Not on  file   Social History Narrative    Not on file     Social Determinants of Health     Financial Resource Strain: Low Risk  (03/30/2022)    Overall Financial Resource Strain (CARDIA)     Difficulty of Paying Living Expenses: Not hard at all   Food Insecurity: No Food Insecurity (03/30/2022)    Hunger Vital Sign     Worried About Running Out of Food in the Last Year: Never true     Ran Out of Food in the Last Year: Never true   Transportation Needs: Unknown (03/30/2022)    PRAPARE - Armed forces logistics/support/administrative officer (Medical): Not on file     Lack of Transportation (Non-Medical): No   Physical Activity: Inactive  (05/11/2022)    Exercise Vital Sign     Days of Exercise per Week: 0 days     Minutes of Exercise per Session: 0 min   Stress: Not on file   Social Connections: Not on file   Intimate Partner Violence: Not on file   Housing Stability: Unknown (03/30/2022)    Housing Stability Vital Sign     Unable to Pay for Housing in the Last Year: Not on file     Number of Places Lived in the Last Year: Not on file     Unstable Housing in the Last Year: No       REVIEW OF SYSTEMS:    14 point review of systems on the intake form is negative except as noted in the HPI    PHYSICAL EXAMINATION:  Resp 18   Ht '5\' 2"'$  (1.575 m)   BMI 30.73 kg/m   Body mass index is 30.73 kg/m.    GENERAL: Alert and oriented x3, in no acute distress, well-developed, well-nourished.  HEENT: Normocephalic, atraumatic.    Shoulder Examination     R   L  ROM   FF  Full   90  ER  Full   20   IR  Full   Hip  Rotator Cuff Pain/Weakness   Supra  -   -   Infra  -   -   Subscap -   -  Crepitus  -   +  Effusion  -   -  Warmth  -   -   Erythema  -   -  Instability  -   -  AC Joint TTP  -   -  Clavicle   Deformity -   -   TTP  -   -  Proximal Humerus   Deformity -   -   TTP  -   -  Deltoid Strength 5   5  Biceps Strength 5   5  Biceps Deformity -   -  Biceps Groove Pain -   -  Impingement Sign -   -       IMAGING:  Imaging read by myself and interpreted as follows:  4 view x-rays of the left shoulder taken the office today show severe end-stage glenohumeral osteoarthritis.  Large inferior humeral osteophyte.  Complete joint space collapse.    ASSESSMENT & PLAN  Diagnosis: Left shoulder severe glenohumeral osteoarthritis    Elaine [name] has severe left shoulder glenohumeral osteoarthritis that is symptomatic.  We discussed the treatment options today at length including the possibility of surgery.  However due to recent medical issues she is not a candidate for surgery at this time.  She is  also considering surgery on her left hip prior to any surgery on her  shoulder.  She cannot take anti-inflammatories due to history of gastric issues.  Therefore I recommended and she agreed to a corticosteroid injection into the left shoulder.  I will see her back as needed.    Prescription medication management discussed.     Plan was discussed in detail with patient, all questions were answered, and patient voiced understanding of plan.    [facility]  OFFICE PROCEDURE PROGRESS NOTE        Chart reviewed for the following:   I, Waynetta Pean, MD, have reviewed the History, Physical and updated the Allergic reactions for Elaine [name] performed immediately prior to start of procedure:   I, Waynetta Pean, MD, have performed the following reviews on [place] prior to the start of the procedure:            * Patient was identified by name and date of birth   * Agreement on procedure being performed was verified  * Risks and Benefits explained to the patient  * Procedure site verified and marked as necessary  * Patient was positioned for comfort  * Consent was signed and verified    Time: 10:51 AM    Location: Left shoulder glenohumeral joint injection    Kenalog '40mg'$  & 3cc Lidocaine    Date of procedure: 08/28/2022    Procedure performed by:  Waynetta Pean, MD    Provider assisted by: Office Staff Assistant    Patient assisted by: self    How tolerated by patient: tolerated the procedure well with no complications    Post Procedural Pain Scale: 0 - No Hurt    Comments: none      Electronically signed by: Waynetta Pean, MD    Note: This note was completed using voice recognition software.  Any typographical/name errors or mistakes are unintentional.

## 2022-09-04 NOTE — Telephone Encounter (Signed)
Left detailed message on patient's personal cell phone.

## 2022-09-04 NOTE — Telephone Encounter (Signed)
Patient returned call and advised of her the message from Dr. Madelyn Brunner and she stated she will let her son know. No further questions.

## 2022-09-04 NOTE — Telephone Encounter (Signed)
She had colonoscopy May 2021 with Dr Angelique Holm that showed no colon cancer and she had CT abd/pelvis 06/29/2022 that showed no colon cancer.

## 2022-09-04 NOTE — Telephone Encounter (Signed)
Pt son Avynn Klassen called - pt has had issues with blood in her stool , she was tested and told she had ruptured blood vessels in her intestines   He is asking if mom was checked for colon cancer -

## 2022-09-10 ENCOUNTER — Ambulatory Visit: Admit: 2022-09-10 | Discharge: 2022-09-10 | Payer: MEDICARE | Attending: Internal Medicine | Primary: Internal Medicine

## 2022-09-10 VITALS — BP 100/60 | HR 97 | Temp 98.70000°F | Resp 20 | Ht 62.0 in | Wt 153.0 lb

## 2022-09-10 DIAGNOSIS — M545 Low back pain, unspecified: Secondary | ICD-10-CM

## 2022-09-10 DIAGNOSIS — G935 Compression of brain: Principal | ICD-10-CM

## 2022-09-10 MED ORDER — PREDNISONE 10 MG (21) PO TBPK
10 | ORAL | 0 refills | Status: DC
Start: 2022-09-10 — End: 2022-12-03

## 2022-09-10 MED ORDER — ALBUTEROL SULFATE HFA 108 (90 BASE) MCG/ACT IN AERS
108 | RESPIRATORY_TRACT | 5 refills | Status: AC | PRN
Start: 2022-09-10 — End: ?

## 2022-09-10 NOTE — Progress Notes (Signed)
Elaine [name] presents today for   Chief Complaint   Patient presents with    Lower Back Pain           1. "Have you been to the ER, urgent care clinic since your last visit?  Hospitalized since your last visit?" no    2. "Have you seen or consulted any other health care providers outside of the [facility] since your last visit?" no     3. For patients aged 72-75: Has the patient had a colonoscopy / FIT/ Cologuard? Yes - no Care Gap present      If the patient is female:    4. For patients aged 39-74: Has the patient had a mammogram within the past 2 years? NA - based on age or sex      41. For patients aged 21-65: Has the patient had a pap smear? NA - based on age or sex

## 2022-09-10 NOTE — Progress Notes (Signed)
Elaine [name] (DOB:  12/27/1950) is a 71 y.o. female established patient, here for evaluation of the following chief complaint(s):  Lower Back Pain         ASSESSMENT/PLAN:    ICD-10-CM    1. 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 Unsteady gait when walking  R26.81 Amb External Referral To Neurology             Return if symptoms worsen or fail to improve.         Subjective   SUBJECTIVE/OBJECTIVE:  Back Pain  Patient presents for evaluation of low back problems. Symptoms have been present for 5 days and include pain in lower back (sharp in character; 6/10 in severity). Initial inciting event:  still working as a Pharmacist, hospital . Symptoms are worse in the evening. Alleviating factors identifiable by the patient are recumbency. Aggravating factors identifiable by the patient are standing and walking. Treatments initiated by the patient:  tylenol and Lidoderm patches  . Previous lower back problems:  yes . Previous work up:  Patient has been seen by the spine center and has had injections into her back .  She has been less active over the last couple of months as she is being treated for other medical issues.  This may have from weaken her core muscles exacerbating her current back pain.  Patient also has a history of Chiari I malformation and with increasing unsteady gait will refer patient to neurology for further evaluation.  Patient will be out from work from today 10 5 until 11/6 so that she can participate in physical therapy to decrease low back pain and improve gait.  Patient will be seen around October 30 for her regular appointment to reevaluate to see if she can return to work    Respiratory ROS: negative for - shortness of breath  Cardiovascular ROS: negative for - chest pain       Objective   BP  100/60 (Site: Right Upper Arm, Position: Sitting, Cuff Size: Large Adult)   Pulse 97   Temp 98.7 F (37.1 C) (Temporal)   Resp 20   Ht '5\' 2"'$  (1.575 m)   Wt 153 lb (69.4 kg)   SpO2 95%   BMI 27.98 kg/m   Back exam - antalgic gait, pain with motion noted during exam, tenderness noted in paraspinal muscles in lumbar area, negative straight-leg raise bilaterally , patient has a constant flexion of her spine with walking       Current Outpatient Medications   Medication Sig    albuterol sulfate HFA (PROVENTIL;VENTOLIN;PROAIR) 108 (90 Base) MCG/ACT inhaler Inhale 2 puffs into the lungs every 4 hours as needed for Wheezing or Shortness of Breath    predniSONE 10 MG (21) TBPK Use as directed    valsartan (DIOVAN) 160 MG tablet Take 1 tablet by mouth daily    cyanocobalamin 1000 MCG tablet Take 2.5 tablets by mouth daily    ZINC PO Take 1 tablet by mouth daily    atorvastatin (LIPITOR) 40 MG tablet TAKE  1  TABLET  EVERY  DAY (NEED MD APPOINTMENT)    vitamin D3 (CHOLECALCIFEROL) 125 MCG (5000 UT) TABS tablet Take 1 tablet by mouth daily  iron dextran complex (INFED) 50 MG/ML injection Infuse 19.5 mLs intravenously Pt gets these every other month    latanoprost (XALATAN) 0.005 % ophthalmic solution Place 1 drop into both eyes nightly    levothyroxine (SYNTHROID) 150 MCG tablet TAKE 1 TABLET EVERY DAY BEFORE BREAKFAST (NEED MD APPOINTMENT)    lidocaine (LIDODERM) 5 % Place 1 patch onto the skin every 24 hours    pantoprazole (PROTONIX) 40 MG tablet Take 1 tablet by mouth daily    potassium chloride (KLOR-CON M) 10 MEQ extended release tablet TAKE 1 TABLET EVERY DAY    gabapentin (NEURONTIN) 300 MG capsule TAKE 1 CAPSULE EVERY MORNING  AND TAKE 2 CAPSULES EVERY NIGHT     No current facility-administered medications for this visit.                 An electronic signature was used to authenticate this note.    --Imelda Dandridge Vear Clock, MD

## 2022-09-16 ENCOUNTER — Ambulatory Visit
Admit: 2022-09-16 | Discharge: 2022-09-16 | Payer: MEDICARE | Attending: Physician Assistant | Primary: Internal Medicine

## 2022-09-16 DIAGNOSIS — N261 Atrophy of kidney (terminal): Secondary | ICD-10-CM

## 2022-09-16 LAB — AMB POC PVR, MEAS,POST-VOID RES,US,NON-IMAGING: PVR, POC: 0 cc

## 2022-09-16 LAB — AMB POC URINALYSIS DIP STICK AUTO W/O MICRO
Bilirubin, Urine, POC: NEGATIVE
Blood, Urine, POC: NEGATIVE
Glucose, Urine, POC: NEGATIVE
Ketones, Urine, POC: NEGATIVE
Leukocyte Esterase, Urine, POC: NEGATIVE
Nitrite, Urine, POC: NEGATIVE
Protein, Urine, POC: NEGATIVE
Specific Gravity, Urine, POC: 1.01 (ref 1.001–1.035)
Urobilinogen, POC: 1
pH, Urine, POC: 6.5 (ref 4.6–8.0)

## 2022-09-16 MED ORDER — LEVOTHYROXINE SODIUM 150 MCG PO TABS
150 | ORAL_TABLET | ORAL | 3 refills | Status: DC
Start: 2022-09-16 — End: 2022-10-06

## 2022-09-16 MED ORDER — TERBINAFINE HCL 250 MG PO TABS
250 | ORAL_TABLET | Freq: Every day | ORAL | 3 refills | Status: DC
Start: 2022-09-16 — End: 2022-12-03

## 2022-09-16 MED ORDER — ATORVASTATIN CALCIUM 40 MG PO TABS
40 MG | ORAL_TABLET | ORAL | 3 refills | Status: DC
Start: 2022-09-16 — End: 2023-10-04

## 2022-09-16 NOTE — Telephone Encounter (Signed)
Elaine [name] has an order for [facility]US      ALL FEMALE PATIENTS NEEDING AN MRI OF PELVIS OR PROSTATE, MUST BE SCHEDULED AT ONE OF THE FOLLOWING:    **MRI/CT (Preferred [facility])  **[facility] (Preferred [place])  [place] / [facility]      To be done at: [facility]    Needed by:   September 07, 2023    Patient has a follow-up appointment:  Yes  September 17, 2023    If MRI, does patient have a pacemaker:   No    Order has been placed in connect care:  Yes    Is this a STAT order:  No      Joaquin Bend, MA

## 2022-09-16 NOTE — Progress Notes (Signed)
Dr. Clabe Seal is the supervising physician today.            Elaine [name]  [birth year]         Diagnosis Orders   1. Renal atrophy        2. Urinary incontinence, unspecified type  AMB POC URINALYSIS DIP STICK AUTO W/O MICRO    AMB POC PVR, MEAS,POST-VOID RES,US,NON-IMAGING      3. Kidney stones  [place] RETROPERITONEAL COMPLETE          Assessment:  UA today negative  PVR today 0 cc     1. Urinary incontinence - Resolved    2. Kidney Stones            Found on CT 06/22/22 - Several tiny nonobstructing right-sided renal calcifications, the  largest measuring 5 mm in the posterior lower pole right kidney. No hydro.     3. Right Kidney Atrophy           Found on CT 06/22/22 - Mild global right renal atrophy compared to the left without hydronephrosis.  Likely due to chronic vascular compromise           Vascular US Renal Arterial Duplex 07/21/22 - No hemodynamically significant stenosis involving the right renal artery.           Patient asymptomatic.            Vascular appointment 10/13.            Cr WNL    Plan:  Reviewed imaging and case with Dr. Clabe Seal. Patient asymptomatic, no stenosis. Could have been present since birth or for several years. Continue to observe.   Empiric Stone Prevention Recommendations were discussed.  Patient not interested in stone surgery or workup. Plan for RUS in 1 year.     RTC in 1 year with RUS prior.     Body mass index is 28.35 kg/m.       Chief Complaint   Patient presents with    Stress urinary incontinence       History of Present Illness:  Elaine [name] is a 71 y.o. female who presents today in consultation for urinary incontinence and atrophic right kidney found on recent CT. Patient with Duplex renal US without stenosis. Scheduled to see vascular 10/13.     Patient reports no bothersome complaints. A few weeks ago had unexpected insensate incontinence that since resolved.   FOS good  Sensation of complete bladder emptying.   Denies straining, hesitancy, or post-void dribbling    Denies urgency, urge incontinence, stress incontinence.     Asymptomatic today for urinary tract infection.   Denies dysuria, gross hematuria, vaginal discharge,  f/c/n/v.   Denies hx of nephrolithiasis, hx of bladder stones, hx of recurrent UTI.   Denies FH of bladder or kidney cancer.       Past Medical History:   Diagnosis Date    Anemia     Asthma     Colon polyp     DDD (degenerative disc disease) 02/15/2009    Depression 12/18/2011    DVT (deep venous thrombosis) ([place]) 10/23/2009    Left leg:  No DVT.      Essential hypertension, benign     History of echocardiogram 03/20/2004    EF 70%.  Borderline DDfx.  No significant valvular pathology.    Hypercholesterolemia     Hypertension     Iron deficiency anemia 1/47/8295    Lichen planus 05/27/3085    Meralgia  paraesthetica 10/22/2013    OA (osteoarthritis of spine) 02/15/2009    OA (osteoarthritis of spine) 02/15/2009    Obesity, unspecified     Other and unspecified hyperlipidemia     Pre-operative cardiovascular examination     For spine surgery    Right sided sciatica     Shortness of breath     Possible asthma, HCVD; less likely CAD (Noted 03/15/09)    Sleep apnea 02/15/2009    uses cpap machine    Thallium stress test abnormal 03/20/2004    Partially transient, mod basal & mid anterior defect most c/w artifact; mild anterior ischemia less likely.  Neg EKG on pharm stress test.    Thyroid disease     hypothyroidism       Past Surgical History:   Procedure Laterality Date    COLONOSCOPY N/A 04/25/2020    SCREENING COLONOSCOPY performed by Sloan Leiter, MD at [facility] ENDOSCOPY    COLONOSCOPY  02-24-12    normal (Dr. Angelique Holm)    [facility]  [facility]    +polyp(tubular adenoma); Dr Angelique Holm    HERNIA REPAIR      3x    HYSTERECTOMY ([place])      NEUROLOGICAL SURGERY  03-16-2009    s/p ACD & Fusion (Dr.P.Gurtner)    TUBAL LIGATION      UPPER GASTROINTESTINAL ENDOSCOPY N/A 06/12/2022    ENTEROSCOPY PUSH DIAGNOSTIC w/small intestinal  bipolar cautery of multiple AVMs performed by Sloan Leiter, MD at [facility] ENDOSCOPY       Social History     Tobacco Use    Smoking status: Former     Packs/day: 1.00     Years: 27.00     Additional pack years: 0.00     Total pack years: 27.00     Types: Cigarettes     Start date: 10/16/1977     Quit date: 04/25/2004     Years since quitting: 18.4     Passive exposure: Never    Smokeless tobacco: Never   Substance Use Topics    Alcohol use: No    Drug use: No       Allergies   Allergen Reactions    Aspirin Other (See Comments)     GI distress,     Diphenhydramine Other (See Comments)     Muscle jerking    Pollen Extract      Other reaction(s): Not Reported This Time       Family History   Problem Relation Age of Onset    Hypertension Sister         x3    Hypertension Mother     Cancer Mother     Heart Surgery Sister     Heart Disease Sister     Hypertension Paternal Uncle     Cancer Maternal Aunt         breast    Breast Cancer Maternal Aunt     Glaucoma Son     Heart Defect Son     Diabetes Paternal Uncle     Heart Attack Sister        Current Outpatient Medications   Medication Sig Dispense Refill    atorvastatin (LIPITOR) 40 MG tablet TAKE 1 TABLET EVERY DAY (NEED MD APPOINTMENT) 90 tablet 3    levothyroxine (SYNTHROID) 150 MCG tablet TAKE 1 TABLET EVERY DAY BEFORE BREAKFAST (NEED MD APPOINTMENT) 90 tablet 3    montelukast (SINGULAIR) 10 MG tablet  albuterol sulfate HFA (PROVENTIL;VENTOLIN;PROAIR) 108 (90 Base) MCG/ACT inhaler Inhale 2 puffs into the lungs every 4 hours as needed for Wheezing or Shortness of Breath 18 g 5    predniSONE 10 MG (21) TBPK Use as directed 1 each 0    valsartan (DIOVAN) 160 MG tablet Take 1 tablet by mouth daily 90 tablet 3    cyanocobalamin 1000 MCG tablet Take 2.5 tablets by mouth daily      ZINC PO Take 1 tablet by mouth daily      vitamin D3 (CHOLECALCIFEROL) 125 MCG (5000 UT) TABS tablet Take 1 tablet by mouth daily      iron dextran complex (INFED) 50 MG/ML injection Infuse  19.5 mLs intravenously Pt gets these every other month      latanoprost (XALATAN) 0.005 % ophthalmic solution Place 1 drop into both eyes nightly      lidocaine (LIDODERM) 5 % Place 1 patch onto the skin every 24 hours      pantoprazole (PROTONIX) 40 MG tablet Take 1 tablet by mouth daily      potassium chloride (KLOR-CON M) 10 MEQ extended release tablet TAKE 1 TABLET EVERY DAY      terbinafine (LAMISIL) 250 MG tablet TAKE 1 TABLET EVERY DAY (Patient not taking: Reported on 09/16/2022) 90 tablet 3    gabapentin (NEURONTIN) 300 MG capsule TAKE 1 CAPSULE EVERY MORNING  AND TAKE 2 CAPSULES EVERY NIGHT 270 capsule 1     No current facility-administered medications for this visit.           PHYSICAL EXAMINATION:     Ht '5\' 2"'$  (1.575 m)   Wt 155 lb (70.3 kg)   BMI 28.35 kg/m   Constitutional: Well developed, well-nourished female in no acute distress.   CV:  No peripheral swelling noted  Respiratory: No respiratory distress or difficulties  Abdomen:  Soft and nontender. No CVA tenderness.   Skin:  Normal color. No evidence of jaundice.     Neuro/Psych:  Patient with appropriate affect.  Alert and oriented.    Lymphatic:   No enlargement of supraclavicular lymph nodes.    REVIEW OF LABS AND IMAGING:      Results for orders placed or performed in visit on 09/16/22   AMB POC URINALYSIS DIP STICK AUTO W/O MICRO   Result Value Ref Range    Color, Urine, POC Yellow     Clarity, Urine, POC Clear     Glucose, Urine, POC Negative Negative    Bilirubin, Urine, POC Negative Negative    Ketones, Urine, POC Negative Negative    Specific Gravity, Urine, POC 1.010 1.001 - 1.035    Blood, Urine, POC Negative Negative    pH, Urine, POC 6.5 4.6 - 8.0    Protein, Urine, POC Negative Negative    Urobilinogen, POC 1 mg/dL     Nitrite, Urine, POC Negative Negative    Leukocyte Esterase, Urine, POC Negative Negative   AMB POC PVR, MEAS,POST-VOID RES,US,NON-IMAGING   Result Value Ref Range    PVR, POC 0 cc       CT AP 06/22/22  IMPRESSION:  1.   Small sclerotic focus at the right femoral head neck junction, which was  present on the right hip radiograph from 10/06/2013 and is unchanged in size.   2.  Mild global right renal atrophy compared to the left without hydronephrosis.  Likely due to chronic vascular compromise. Multiple small nonobstructing right  renal calcifications.  3.  Several tiny ventral near  midline abdominal wall hernia defects as detailed  above. To contain a portion of small bowel without obstructive effects. One  contains mesenteric fat.  4.  Probable hepatic cysts. Degenerative change as detailed above. 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    Eleveated velocity identified in the mid left renal artery in the presence of tortousity.    Left renal resistive indices elevated (consistent with parenchymal disease).    No hemodynamically significant stenosis involving the right renal artery. Right kidney is atrophic in size (per known CTA 06/29/22). Left kidney is normal in size.    Bilateral renal vein is patent and demonstrates normal phasicity.  A copy of today's office visit with all pertinent imaging results and labs were sent to the referring physician,Hunte, Ninfa Meeker, MD    Lessie Dings, PA-C  [facility], [address]  [place] [address]  P: [phone]   F: [phone]

## 2022-09-16 NOTE — Telephone Encounter (Signed)
Please refill if appropriate or refuse medication if not appropriate.    PCP: Thana Farr, MD     Last appt: 09/10/22    Last refill:  Atrovastatin- 10/27/2021  Levothyroxine- 01/06/22  Terbinafine-01/06/22    Future Appointments   Date Time Provider [place]   09/16/2022 10:00 AM Lessie Dings, PA-C [facility] Sched   09/25/2022  8:45 AM Garner Nash, Viona Gilmore, DO [facility] BS AMB   09/29/2022  3:00 PM I[facility] LAB VISIT I[facility] BS AMB   10/06/2022  3:40 PM Hunte, Ninfa Meeker, MD [facility] BS AMB   12/31/2022  8:45 AM Carlyle Dolly, MD CAP BS AMB         Requested Prescriptions     Pending Prescriptions Disp Refills    atorvastatin (LIPITOR) 40 MG tablet [Pharmacy Med Name: ATORVASTATIN CALCIUM 40 MG Tablet] 90 tablet 10     Sig: TAKE 1 TABLET EVERY DAY (NEED MD APPOINTMENT)    terbinafine (LAMISIL) 250 MG tablet [Pharmacy Med Name: TERBINAFINE HYDROCHLORIDE 250 MG Tablet] 90 tablet 10     Sig: TAKE 1 TABLET EVERY DAY    levothyroxine (SYNTHROID) 150 MCG tablet [Pharmacy Med Name: LEVOTHYROXINE SODIUM 150 MCG Tablet] 90 tablet 10     Sig: TAKE 1 TABLET EVERY DAY BEFORE BREAKFAST (NEED MD APPOINTMENT)

## 2022-09-19 NOTE — Telephone Encounter (Signed)
Fax will be sent at 08/08/2023, 12:00 AM

## 2022-09-21 ENCOUNTER — Encounter

## 2022-09-24 NOTE — Telephone Encounter (Signed)
Patient walk to drop off FMLA paperwork from her employer  to be filled out by dr. Please let patient know when paperwork is ready to be picked up.      Paperwork is in dr [employer]

## 2022-09-25 ENCOUNTER — Ambulatory Visit
Admit: 2022-09-25 | Discharge: 2022-09-25 | Payer: MEDICARE | Attending: Orthopaedic Surgery | Primary: Internal Medicine

## 2022-09-25 VITALS — Ht 62.0 in | Wt 158.0 lb

## 2022-09-25 DIAGNOSIS — M1612 Unilateral primary osteoarthritis, left hip: Principal | ICD-10-CM

## 2022-09-25 NOTE — Telephone Encounter (Signed)
Noted.  Patient will be called when FMLA form is ready.

## 2022-09-25 NOTE — Progress Notes (Signed)
Patient: Elaine [name]                MRN: [id number]       SSN: [id number]  Date of Birth: Mar 01, 1951        AGE: 71 y.o.        SEX: female    PCP: Thana Farr, MD  09/25/22    Chief Complaint: Hip Pain (Lt )      1. Primary osteoarthritis of left hip  2. Chronic diastolic congestive heart failure ([place])  3. Hypothyroidism, unspecified type  4. Pre-diabetes  5. Gastrointestinal hemorrhage, unspecified gastrointestinal hemorrhage type    HPI:  Elaine [name] is a 71 y.o. female with chief complaint of   Chief Complaint   Patient presents with    Hip Pain     Lt      We have been planning on a left total hip arthroplasty through the anterolateral approach due to some pain in the lateral hip as well as a overhanging pannus.  She recently, in July, had a GI bleed and has had a cascade of medical issues since then.  She is hoping that she can have her hip replaced at the beginning of the year.        06/22/2022     1:33 PM   AMB PAIN ASSESSMENT   Location of Pain Hip   Location Modifiers Left   Severity of Pain 0   Quality of Pain Aching   Duration of Pain Persistent   Frequency of Pain Intermittent   Aggravating Factors Standing;Walking;Stairs   Limiting Behavior Yes   Relieving Factors Other (Comment)   Result of Injury No   Work-Related Injury No   Are there other pain locations you wish to document? No     Tobacco Use: Medium Risk (09/25/2022)    Patient History     Smoking Tobacco Use: Former     Smokeless Tobacco Use: Never     Passive Exposure: Never     IMAGING:  Imaging read by myself and interpreted as follows:  3 view x-ray of the left hip including AP pelvis and AP and crosstable lateral of the left hip demonstrate significant joint space loss acetabular joint of approximately 90%.  There is osteophytosis,  subchondral sclerosis and subchondral cyst formation.      PHYSICAL EXAMINATION:  Ht 1.575 m ('5\' 2"'$ )   Wt 71.7 kg (158 lb)   BMI 28.90 kg/m   Body mass index is 28.9 kg/m.  Wt Readings  from Last 3 Encounters:   09/25/22 71.7 kg (158 lb)   09/16/22 70.3 kg (155 lb)   09/10/22 69.4 kg (153 lb)       GENERAL: Alert and oriented x3, in no acute distress.  HEENT: Normocephalic, atraumatic.    MSK: Left Hip Exam     Tenderness   The patient is experiencing tenderness in the lateral.    Range of Motion   Flexion:  100   External rotation:  30   Internal rotation: 10     Muscle Strength   Abduction: 5/5   Adduction: 5/5   Flexion: 5/5     Other   Erythema: absent  Scars: absent  Sensation: normal  Pulse: present    Comments:  Skin over the left hip is clean dry and intact.  There is a large overhanging pannus which overlies the incision site for a direct anterior approach.  Has difficulty maintaining single-leg stance on the left with  positive Trendelenburg sign.         Assessment and plan:    September 25, 2022:  We will continue to plan on doing a left total hip arthroplasty through an anterolateral approach.  Again addressed the benefits and risks of anterolateral versus direct anterior the patient would like to continue the anterior lateral plan.  She does need to improve her health and get stabilized with recent GI bleed.  We will hope to move forward after the first of the year.      Past Medical History:   Diagnosis Date    Anemia     Asthma     Colon polyp     DDD (degenerative disc disease) 02/15/2009    Depression 12/18/2011    DVT (deep venous thrombosis) ([place]) 10/23/2009    Left leg:  No DVT.      Essential hypertension, benign     History of echocardiogram 03/20/2004    EF 70%.  Borderline DDfx.  No significant valvular pathology.    Hypercholesterolemia     Hypertension     Iron deficiency anemia 5/40/0867    Lichen planus 05/25/5092    Meralgia paraesthetica 10/22/2013    OA (osteoarthritis of spine) 02/15/2009    OA (osteoarthritis of spine) 02/15/2009    Obesity, unspecified     Other and unspecified hyperlipidemia     Pre-operative cardiovascular examination     For spine surgery    Right  sided sciatica     Shortness of breath     Possible asthma, HCVD; less likely CAD (Noted 03/15/09)    Sleep apnea 02/15/2009    uses cpap machine    Thallium stress test abnormal 03/20/2004    Partially transient, mod basal & mid anterior defect most c/w artifact; mild anterior ischemia less likely.  Neg EKG on pharm stress test.    Thyroid disease     hypothyroidism       Family History   Problem Relation Age of Onset    Hypertension Sister         x3    Hypertension Mother     Cancer Mother     Heart Surgery Sister     Heart Disease Sister     Hypertension Paternal Uncle     Cancer Maternal Aunt         breast    Breast Cancer Maternal Aunt     Glaucoma Son     Heart Defect Son     Diabetes Paternal Uncle     Heart Attack Sister        Current Outpatient Medications   Medication Sig Dispense Refill    atorvastatin (LIPITOR) 40 MG tablet TAKE 1 TABLET EVERY DAY (NEED MD APPOINTMENT) 90 tablet 3    terbinafine (LAMISIL) 250 MG tablet TAKE 1 TABLET EVERY DAY (Patient not taking: Reported on 09/16/2022) 90 tablet 3    levothyroxine (SYNTHROID) 150 MCG tablet TAKE 1 TABLET EVERY DAY BEFORE BREAKFAST (NEED MD APPOINTMENT) 90 tablet 3    montelukast (SINGULAIR) 10 MG tablet       albuterol sulfate HFA (PROVENTIL;VENTOLIN;PROAIR) 108 (90 Base) MCG/ACT inhaler Inhale 2 puffs into the lungs every 4 hours as needed for Wheezing or Shortness of Breath 18 g 5    predniSONE 10 MG (21) TBPK Use as directed 1 each 0    valsartan (DIOVAN) 160 MG tablet Take 1 tablet by mouth daily 90 tablet 3    cyanocobalamin 1000 MCG tablet Take 2.5  tablets by mouth daily      gabapentin (NEURONTIN) 300 MG capsule TAKE 1 CAPSULE EVERY MORNING  AND TAKE 2 CAPSULES EVERY NIGHT 270 capsule 1    ZINC PO Take 1 tablet by mouth daily      vitamin D3 (CHOLECALCIFEROL) 125 MCG (5000 UT) TABS tablet Take 1 tablet by mouth daily      iron dextran complex (INFED) 50 MG/ML injection Infuse 19.5 mLs intravenously Pt gets these every other month      latanoprost  (XALATAN) 0.005 % ophthalmic solution Place 1 drop into both eyes nightly      lidocaine (LIDODERM) 5 % Place 1 patch onto the skin every 24 hours      pantoprazole (PROTONIX) 40 MG tablet Take 1 tablet by mouth daily      potassium chloride (KLOR-CON M) 10 MEQ extended release tablet TAKE 1 TABLET EVERY DAY       No current facility-administered medications for this visit.        Allergies   Allergen Reactions    Aspirin Other (See Comments)     GI distress,     Diphenhydramine Other (See Comments)     Muscle jerking    Pollen Extract      Other reaction(s): Not Reported This Time       Past Surgical History:   Procedure Laterality Date    COLONOSCOPY N/A 04/25/2020    SCREENING COLONOSCOPY performed by Sloan Leiter, MD at [facility] ENDOSCOPY    COLONOSCOPY  02-24-12    normal (Dr. Angelique Holm)    COLONOSCOPY FLX DX W/COLLJ [facility] WHEN PFRMD  02-2007    +polyp(tubular adenoma); Dr Angelique Holm    HERNIA REPAIR      3x    HYSTERECTOMY ([place])      NEUROLOGICAL SURGERY  03-16-2009    s/p ACD & Fusion (Dr.P.Gurtner)    TUBAL LIGATION      UPPER GASTROINTESTINAL ENDOSCOPY N/A 06/12/2022    ENTEROSCOPY PUSH DIAGNOSTIC w/small intestinal bipolar cautery of multiple AVMs performed by Sloan Leiter, MD at [facility] ENDOSCOPY       Social History     Socioeconomic History    Marital status: Divorced     Spouse name: Not on file    Number of children: Not on file    Years of education: Not on file    Highest education level: Not on file   Occupational History    Not on file   Tobacco Use    Smoking status: Former     Packs/day: 1.00     Years: 27.00     Additional pack years: 0.00     Total pack years: 27.00     Types: Cigarettes     Start date: 10/16/1977     Quit date: 04/25/2004     Years since quitting: 18.4     Passive exposure: Never    Smokeless tobacco: Never   Substance and Sexual Activity    Alcohol use: No    Drug use: No    Sexual activity: Not Currently   Other Topics Concern    Not on file   Social History Narrative    Not  on file     Social Determinants of Health     Financial Resource Strain: Low Risk  (03/30/2022)    Overall Financial Resource Strain (CARDIA)     Difficulty of Paying Living Expenses: Not hard at all   Food Insecurity: No Food Insecurity (03/30/2022)  Hunger Vital Sign     Worried About Running Out of Food in the Last Year: Never true     Ran Out of Food in the Last Year: Never true   Transportation Needs: Unknown (03/30/2022)    PRAPARE - Armed forces logistics/support/administrative officer (Medical): Not on file     Lack of Transportation (Non-Medical): No   Physical Activity: Inactive (05/11/2022)    Exercise Vital Sign     Days of Exercise per Week: 0 days     Minutes of Exercise per Session: 0 min   Stress: Not on file   Social Connections: Not on file   Intimate Partner Violence: Not on file   Housing Stability: Unknown (03/30/2022)    Housing Stability Vital Sign     Unable to Pay for Housing in the Last Year: Not on file     Number of Places Lived in the Last Year: Not on file     Unstable Housing in the Last Year: No       REVIEW OF SYSTEMS:      No changes from previous review of systems unless noted.      Prescription medication management discussed with patient.     Electronically signed by: Merdis Delay, DO    Note: This note was completed using voice recognition software.  Any typographical/name errors or mistakes are unintentional.

## 2022-09-29 ENCOUNTER — Ambulatory Visit: Admit: 2022-09-29 | Discharge: 2022-09-29 | Payer: MEDICARE | Primary: Internal Medicine

## 2022-09-29 DIAGNOSIS — D500 Iron deficiency anemia secondary to blood loss (chronic): Secondary | ICD-10-CM

## 2022-09-29 NOTE — Telephone Encounter (Signed)
Patient is aware she will need to bring FMLA form today.

## 2022-09-30 ENCOUNTER — Inpatient Hospital Stay: Admit: 2022-09-30 | Payer: MEDICARE | Primary: Internal Medicine

## 2022-09-30 DIAGNOSIS — M5459 Other low back pain: Secondary | ICD-10-CM

## 2022-09-30 LAB — CBC/DIFF AMBIGUOUS DEFAULT
Basophils %: 0 %
Basophils Absolute: 0 10*3/uL (ref 0.0–0.2)
Eosinophils %: 1 %
Eosinophils Absolute: 0.1 10*3/uL (ref 0.0–0.4)
Hematocrit: 31 % — ABNORMAL LOW (ref 34.0–46.6)
Hemoglobin: 9.5 g/dL — ABNORMAL LOW (ref 11.1–15.9)
Immature Grans (Abs): 0 10*3/uL (ref 0.0–0.1)
Immature Granulocytes %: 0 %
Lymphocytes %: 16 %
Lymphocytes Absolute: 1.1 10*3/uL (ref 0.7–3.1)
MCH: 23.3 pg — ABNORMAL LOW (ref 26.6–33.0)
MCHC: 30.6 g/dL — ABNORMAL LOW (ref 31.5–35.7)
MCV: 76 fL — ABNORMAL LOW (ref 79–97)
Monocytes %: 7 %
Monocytes Absolute: 0.5 10*3/uL (ref 0.1–0.9)
Neutrophils %: 76 %
Neutrophils Absolute: 5.1 10*3/uL (ref 1.4–7.0)
Platelets: 312 10*3/uL (ref 150–450)
RBC: 4.08 x10E6/uL (ref 3.77–5.28)
RDW: 17 % — ABNORMAL HIGH (ref 11.7–15.4)
WBC: 6.8 10*3/uL (ref 3.4–10.8)

## 2022-09-30 LAB — COMPREHENSIVE METABOLIC PANEL
ALT: 18 IU/L (ref 0–32)
AST: 14 IU/L (ref 0–40)
Albumin/Globulin Ratio: 1.4 (ref 1.2–2.2)
Albumin: 3.5 g/dL — ABNORMAL LOW (ref 3.8–4.8)
Alkaline Phosphatase: 91 IU/L (ref 44–121)
BUN/Creatinine Ratio: 9 — ABNORMAL LOW (ref 12–28)
BUN: 5 mg/dL — ABNORMAL LOW (ref 8–27)
CO2: 26 mmol/L (ref 20–29)
Calcium: 9.5 mg/dL (ref 8.7–10.3)
Chloride: 106 mmol/L (ref 96–106)
Creatinine: 0.56 mg/dL — ABNORMAL LOW (ref 0.57–1.00)
Est, Glom Filt Rate: 97 mL/min/{1.73_m2} (ref >59)
Globulin, Total: 2.5 g/dL (ref 1.5–4.5)
Glucose: 95 mg/dL (ref 70–99)
Potassium: 3.6 mmol/L (ref 3.5–5.2)
Sodium: 145 mmol/L — ABNORMAL HIGH (ref 134–144)
Total Bilirubin: 0.5 mg/dL (ref 0.0–1.2)
Total Protein: 6 g/dL (ref 6.0–8.5)

## 2022-09-30 LAB — LIPID PANEL
Cholesterol: 153 mg/dL (ref 100–199)
HDL: 51 mg/dL (ref >39)
LDL Calculated: 83 mg/dL (ref 0–99)
Triglycerides: 101 mg/dL (ref 0–149)
VLDL Cholesterol Calculated: 19 mg/dL (ref 5–40)

## 2022-09-30 LAB — TSH + FREE T4 PANEL
T4 Free: 2.29 ng/dL — ABNORMAL HIGH (ref 0.82–1.77)
TSH: 0.018 u[IU]/mL — ABNORMAL LOW (ref 0.450–4.500)

## 2022-09-30 LAB — SPECIMEN STATUS REPORT

## 2022-09-30 LAB — HEMOGLOBIN A1C: Hemoglobin A1C: 5.7 % — ABNORMAL HIGH (ref 4.8–5.6)

## 2022-09-30 LAB — VITAMIN D 25 HYDROXY: Vit D, 25-Hydroxy: 79.6 ng/mL (ref 30.0–100.0)

## 2022-09-30 NOTE — Progress Notes (Signed)
PHYSICAL / OCCUPATIONAL THERAPY - DAILY TREATMENT NOTE (updated 1/23)  For Eval visit    Patient Name: [name]    Date: 09/30/2022    DOB: May 26, 1951  Insurance: Payor: [employer] MEDICARE / Plan: [employer] Ros[employer] GOLD PLUS HMO / Product Type: *No Product type* /      Patient DOB verified yes     Visit #   Current / Total 1 16   Time   In / Out 143 216   Pain   In / Out 3 3   Subjective Functional Status/Changes: See POC     TREATMENT AREA =  see POC    OBJECTIVE          33 min   Eval - untimed                      Therapeutic Procedures:    Tx Min Billable or 1:1 Min (if diff from [employer]) Procedure, Rationale, Specifics          Details if applicable:              Details if applicable:            Details if applicable:            Details if applicable:            Details if applicable:       [facility] BC Totals Reminder: bill using total billable min of TIMED therapeutic procedures (example: do not include dry needle or estim unattended, both untimed codes, in totals to left)  8-22 min = 1 unit; 23-37 min = 2 units; 38-52 min = 3 units; 53-67 min = 4 units; 68-82 min = 5 units   Total Total     '[x]'$   Patient Education billed concurrently with other procedures   '[x]'$  Review HEP    '[]'$  Progressed/Changed HEP, detail:    '[]'$  Other detail:       Objective Information/Functional Measures/Assessment    See POC    Patient will continue to benefit from skilled PT / OT services to modify and progress therapeutic interventions, analyze and address functional mobility deficits, analyze and address ROM deficits, analyze and address strength deficits, analyze and address soft tissue restrictions, analyze and cue for proper movement patterns, analyze and modify for postural abnormalities, and analyze and address imbalance/dizziness to address functional deficits and attain remaining goals.    Progress toward goals / Updated goals:  '[x]'$   See POC      PLAN  yes Continue plan of care  '[]'$   Other:      Florentina Addison, PT    09/30/2022    1:38  PM    Future Appointments   Date Time Provider [place]   10/06/2022  3:40 PM Thana Farr, MD [facility] BS AMB   10/12/2022  4:00 PM [place] RM 2 [facility]   10/12/2022  4:45 PM [facility] MRI RM 1 [facility]   12/31/2022  8:45 AM Carlyle Dolly, MD CAP BS AMB   09/16/2023 10:20 AM Lessie Dings, PA-C [facility]

## 2022-09-30 NOTE — Progress Notes (Addendum)
[facility]  [address] [place] [address]  [phone]  Plan of Care / Statement of Necessity for Physical Therapy Services     Patient Name: Elaine [name] DOB: December 19, 1950   Medical   Diagnosis: Other low back pain [M54.59] Treatment Diagnosis: M54.59  OTHER LOWER BACK PAIN    Onset Date: 09/10/22     Referral Source: Thana Farr, MD Start of Care [facility]): 09/30/2022   Prior Hospitalization: See medical history Provider #: [phone]   Prior Level of Function: Independent with ADLs, standing for long period of time , walking   and Community activities with Less Pain and less often    Comorbidities: Arthritis, Chiari malformation,  Anemia, HTN, Asthma , Low potassium , prior surgeries      Assessment / key information:  Elaine [name] is a 71 y.o.  yo female with Dx:  Acute right -sided  Low back Pain without sciatica , who reports  Low back Pain.  Pt rates pain as 10/10 max, 1/10 at best, 3/10 today, increasing with ADLS , community activities walking, standing for Long period of time  and bending .  Objective: FOTO score = unable to assess FOTO and pt will do it  in next follow up visit  (an established functional score where 100 = no disability).  AROM:  Lumbar Spine Flex - 53    AROM Lumbar Ext 5 degree    MMT:  BLE strength( B hip Flex / B Knee Flex/ Ext ) 3+/5. Pain, tightness and Tenderness  on  Low back area , Paraspinal muscles and B Piriformis muscles . B SLR negative , Standing with feet together - 0 seconds , Semi-tandem -0 sec and Tandem - 0 seconds  . Pt reports that Pt has  couple recent falls . Pt  instructed in HEP and will f/u in clinic for PT.     Evaluation Complexity:  History:  MEDIUM  Complexity : 1-2 comorbidities / personal factors will impact the outcome/ POC ; Examination:  MEDIUM Complexity : 3 Standardized tests and measures addressin body structure, function, activity limitation and / or  participation in recreation  ;Presentation:  MEDIUM Complexity : Evolving with changing characteristics  ;Clinical Decision Making:   unable to assess FOTO and pt will do it  in next follow up visit  FOTO score = an established functional score where 100 = no disability  Overall Complexity Rating: MEDIUM  Problem List: pain affecting function, decrease ROM, decrease strength, impaired gait/balance, decrease ADL/functional abilities, decrease activity tolerance, and decrease flexibility/joint mobility   Treatment Plan may include any combination of the following: 97110 Therapeutic Exercise, 97112 Neuromuscular Re-Education, 97140 Manual Therapy, 97530 Therapeutic Activity, 97535 Self Care/Home Management, 97014 Electrical Stim unattended, 97116 Gait Training, and [phone] Ultrasound  Patient / Family readiness to learn indicated by: asking questions, trying to perform skills, and interest  Persons(s) to be included in education: patient (P)  Barriers to Learning/Limitations: none  Measures taken if barriers to learning present:  None   Patient Goal (s): To improve strength , ROM and decrease Pain with ADLS and Community activities   Patient Self Reported Health Status: fair  Rehabilitation Potential: good      Short Term Goals: To be accomplished in  3-4  weeks  1. Independent with HEP.  EVAL: HEP established and given to patient     2. Decrease pain to 1/10  to assist with  ADLs  and Community activities   EVAL:3 /10     Long Term Goals: To be accomplished in  6-8  weeks:  1.  Decrease  pain to 0/10  to assist with  ADLS and community activities   EVAL:  3/10     2.  To improve   BLE strength( B hip Flex / B Knee Flex/ Ext ) to 5/5 in order to assist with ADLS and  Community activities   EVAL:  3+/5     3.To improve  standing with feet Together for 10 seconds with decrease risk for falls   Eval : Feet together  0 seconds       Frequency / Duration: Patient to be seen 1-2  times per week for 6-8 weeks    Patient/  Caregiver education and instruction: Diagnosis, prognosis, self care, activity modification, and exercises '[x]'$   Plan of care has been reviewed with PTA    Certification Period: 09/30/22 to 11/30/22     Florentina Addison, PT       09/30/2022       1:39 PM    Payor: Mcarthur Rossetti MEDICARE / Plan: [place] HMO / Product Type: *No Product type* /     Physician signature required for Medicare, Medicaid, Worker's Comp, Direct Access   ===================================================================  I certify that the above Therapy Services are being furnished while the patient is under my care. I agree with the treatment plan and certify that this therapy is necessary.    67 Signature:_________________________   DATE:_________   TIME:________                           Thana Farr, MD    ** Signature, Date and Time must be completed for valid certification **  Please sign and fax to [facility]. Thank you

## 2022-10-02 NOTE — Telephone Encounter (Signed)
LM on 10/02/22 at 3:22pm to schedule f/u visits due to the patient's insurance authorization being approved for 10 visits expiring 11/30/22. 1-2 times a week for 6-8 weeks.

## 2022-10-05 ENCOUNTER — Encounter: Payer: MEDICARE | Attending: Internal Medicine | Primary: Internal Medicine

## 2022-10-06 ENCOUNTER — Ambulatory Visit: Admit: 2022-10-06 | Discharge: 2022-10-06 | Payer: MEDICARE | Attending: Internal Medicine | Primary: Internal Medicine

## 2022-10-06 DIAGNOSIS — I10 Essential (primary) hypertension: Secondary | ICD-10-CM

## 2022-10-06 DIAGNOSIS — M5442 Lumbago with sciatica, left side: Secondary | ICD-10-CM

## 2022-10-06 MED ORDER — LEVOTHYROXINE SODIUM 100 MCG PO TABS
100 | ORAL_TABLET | Freq: Every day | ORAL | 1 refills | Status: DC
Start: 2022-10-06 — End: 2023-03-31

## 2022-10-06 MED ORDER — VALSARTAN 320 MG PO TABS
320 | ORAL_TABLET | Freq: Every day | ORAL | 3 refills | Status: DC
Start: 2022-10-06 — End: 2023-01-04

## 2022-10-06 NOTE — Progress Notes (Signed)
Elaine [name] (DOB:  07-20-1951) is a 71 y.o. female established patient, here for evaluation of the following chief complaint(s):  Follow-up, Back Pain, and Hypertension         ASSESSMENT/PLAN:    ICD-10-CM    1. Acute midline low back pain with bilateral sciatica  M54.42 Patient still unable to return to work due to severe pain with standing for more than 15 minutes.  Patient will follow-up with physical therapy for core strengthening I will be out of work at least until 11/16/2022.    M54.41       2. Essential (primary) hypertension  I10 Uncontrolled will increase to valsartan (DIOVAN) 320 MG tablet            3. 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  This may be contributing to some of her weight loss so we will stop medication for the next 5 days and restart at levothyroxine (SYNTHROID) 100 MCG tablet.  We will check TSH in the next 6 weeks.             Return in about 4 weeks (around 11/03/2022) for Back Pain, BP Check.         Subjective   SUBJECTIVE/OBJECTIVE:  Back Pain  Patient comes in for follow-up of low back pain and sciatica.  She continues to have severe pain radiating down both her legs from her buttocks with standing for more than 15 minutes.  Pain improves with sitting and is exacerbated by walking for long distances.  Patient had a initial physical therapy appointment and they will call her to schedule follow-up appointments with her hopefully have been at least 2 sessions a week for the next 8 weeks.  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  She will therefore continue out of work until at least 11/16/2022.  Patient's blood pressure is also elevated in the office today and at home so we will go ahead and increase her Diovan 160 mg to 320 mg and reevaluate in 4 weeks as we evaluate her for follow-up for her back pain.    Pt has h/o Grave's Disease and had radioactive  iodine and subsequently had posttreatment hypothyroidism for which she needs Synthroid.  Her TSH  is very low on Synthroid 150 mcg daily .  Patient was supposed to be taking the medication 6 times a week however this becomes confusing for her at times so we will go ahead and decrease Synthroid to 100 mcg daily.  She will stop the medication for the next 5 days and start in the  next week with the 100 mcg daily.    Respiratory ROS: negative for - shortness of breath  Cardiovascular ROS: negative for - chest pain       Objective   BP (!) 155/79   Pulse 91   Temp (!) 96.3 F (35.7 C) (Axillary)   Resp 16   Ht 1.575 m ('5\' 2"'$ )   Wt 71 kg (156 lb 9.6 oz)   SpO2 98%   BMI 28.64 kg/m   Back exam - antalgic gait, pain with motion noted during exam, tenderness noted in paraspinal muscles in lumbar area, positive straight-leg raise bilaterally , patient has a constant flexion of her spine with walking   TSH:    Lab Results   Component Value Date/Time    TSH 0.018 09/29/2022 12:00  AM     FT4:   Lab Results   Component Value Date/Time    T4FREE 2.29 09/29/2022 12:00 AM        Current Outpatient Medications   Medication Sig    valsartan (DIOVAN) 320 MG tablet Take 1 tablet by mouth daily    atorvastatin (LIPITOR) 40 MG tablet TAKE 1 TABLET EVERY DAY (NEED MD APPOINTMENT)    terbinafine (LAMISIL) 250 MG tablet TAKE 1 TABLET EVERY DAY    levothyroxine (SYNTHROID) 150 MCG tablet TAKE 1 TABLET EVERY DAY BEFORE BREAKFAST (NEED MD APPOINTMENT)    montelukast (SINGULAIR) 10 MG tablet     albuterol sulfate HFA (PROVENTIL;VENTOLIN;PROAIR) 108 (90 Base) MCG/ACT inhaler Inhale 2 puffs into the lungs every 4 hours as needed for Wheezing or Shortness of Breath    predniSONE 10 MG (21) TBPK Use as directed    cyanocobalamin 1000 MCG tablet Take 2.5 tablets by mouth daily    ZINC PO Take 1 tablet by mouth daily    vitamin D3 (CHOLECALCIFEROL) 125 MCG (5000 UT) TABS tablet Take 1 tablet by mouth daily    iron dextran complex (INFED) 50  MG/ML injection Infuse 19.5 mLs intravenously Pt gets these every other month    latanoprost (XALATAN) 0.005 % ophthalmic solution Place 1 drop into both eyes nightly    lidocaine (LIDODERM) 5 % Place 1 patch onto the skin every 24 hours    pantoprazole (PROTONIX) 40 MG tablet Take 1 tablet by mouth daily    potassium chloride (KLOR-CON M) 10 MEQ extended release tablet TAKE 1 TABLET EVERY DAY    gabapentin (NEURONTIN) 300 MG capsule TAKE 1 CAPSULE EVERY MORNING  AND TAKE 2 CAPSULES EVERY NIGHT     No current facility-administered medications for this visit.                 An electronic signature was used to authenticate this note.    --Jawara Latorre Vear Clock, MD

## 2022-10-06 NOTE — Telephone Encounter (Signed)
Patient is aware FMLA form is ready for pick up at the front office.

## 2022-10-12 ENCOUNTER — Encounter: Payer: MEDICARE | Primary: Internal Medicine

## 2022-10-12 ENCOUNTER — Inpatient Hospital Stay: Admit: 2022-10-12 | Payer: MEDICARE | Primary: Internal Medicine

## 2022-10-12 ENCOUNTER — Encounter

## 2022-10-12 ENCOUNTER — Ambulatory Visit: Payer: MEDICARE | Primary: Internal Medicine

## 2022-10-12 DIAGNOSIS — N200 Calculus of kidney: Secondary | ICD-10-CM

## 2022-10-12 DIAGNOSIS — I63539 Cerebral infarction due to unspecified occlusion or stenosis of unspecified posterior cerebral artery: Principal | ICD-10-CM

## 2022-10-12 LAB — EJECTION FRACTION PERCENTAGE
Left Ventricular Ejection Fraction High Value: 60 %
Left Ventricular Ejection Fraction: 55 %

## 2022-10-12 NOTE — Other (Signed)
[place] looks good. Follow up as scheduled.

## 2022-10-12 NOTE — Progress Notes (Signed)
Called and left patient a message per Dr. Lyndel Safe regarding clearance. I ordered an echo for preop clearance.  Please get it done.

## 2022-10-12 NOTE — Progress Notes (Signed)
I ordered an echo for preop clearance.  Please get it done

## 2022-10-14 ENCOUNTER — Inpatient Hospital Stay: Admit: 2022-10-14 | Payer: MEDICARE | Primary: Internal Medicine

## 2022-10-14 DIAGNOSIS — M5459 Other low back pain: Secondary | ICD-10-CM

## 2022-10-14 NOTE — Progress Notes (Signed)
PHYSICAL / OCCUPATIONAL THERAPY - DAILY TREATMENT NOTE (updated 1/23)    Patient Name: Elaine [name]    Date: 10/14/2022    DOB: 05/03/1951  Insurance: Payor: [employer] MEDICARE / Plan: Mcarthur Rossetti GOLD PLUS HMO / Product Type: *No Product type* /      Patient DOB verified Yes     Visit #   Current / Total 2 16   Time   In / Out 933 1016   Pain   In / Out 3 0   Subjective Functional Status/Changes: Patient complains of Low back pain today     TREATMENT AREA =  Other low back pain [M54.59]    OBJECTIVE        Therapeutic Procedures:    Tx Min Billable or 1:1 Min (if diff from Tx Min) Procedure, Rationale, Specifics   31  97110 Therapeutic Exercise (timed):  increase ROM, strength, coordination, balance, and proprioception to improve patient's ability to progress to PLOF and address remaining functional goals. (see flow sheet as applicable)     Details if applicable:       12  62952 Therapeutic Activity (timed):  use of dynamic activities replicating functional movements to increase ROM, strength, coordination, balance, and proprioception in order to improve patient's ability to progress to PLOF and address remaining functional goals.  (see flow sheet as applicable)     Details if applicable:            Details if applicable:            Details if applicable:            Details if applicable:     29  MC BC Totals Reminder: bill using total billable min of TIMED therapeutic procedures (example: do not include dry needle or estim unattended, both untimed codes, in totals to left)  8-22 min = 1 unit; 23-37 min = 2 units; 38-52 min = 3 units; 53-67 min = 4 units; 68-82 min = 5 units   Total Total     '[x]'$   Patient Education billed concurrently with other procedures   '[x]'$  Review HEP    '[]'$  Progressed/Changed HEP, detail:    '[]'$  Other detail:       Objective Information/Functional Measures/Assessment  Pt reports that she is doing her HEP  3 times/week. Pt is being educated to do her HEP everyday in order to get full benefits of PT  services .  Patient needs Vcs for proper technique for exercises as mentioned in therapy  flow sheet .  Marland KitchenPatient's pain has improved to 0/10 from 3/10 after PT Tx today .   Patient will continue to benefit from skilled PT / OT services to modify and progress therapeutic interventions, analyze and address functional mobility deficits, analyze and address ROM deficits, analyze and address strength deficits, analyze and address soft tissue restrictions, analyze and cue for proper movement patterns, analyze and modify for postural abnormalities, and analyze and address imbalance/dizziness to address functional deficits and attain remaining goals.    Progress toward goals / Updated goals:  '[]'$   See Progress Note/Recertification    Short Term Goals: To be accomplished in  3-4  weeks  1. Independent with HEP.  EVAL: HEP established and given to patient   Current: Pt reports that she is doing her HEP  3 times/week , 10/14/22     2. Decrease pain to 1/10  to assist with  ADLs  and Community activities   EVAL:3 /10  Long Term Goals: To be accomplished in  6-8  weeks:  1.  Decrease  pain to 0/10  to assist with  ADLS and community activities   EVAL:  3/10      2.  To improve   BLE strength( B hip Flex / B Knee Flex/ Ext ) to 5/5 in order to assist with ADLS and  Community activities   EVAL:  3+/5      3.To improve  standing with feet Together for 10 seconds with decrease risk for falls   Eval : Feet together  0 seconds     Next PN due 10/31/22   RC due 11/30/22      PLAN  - Continue Plan of [place], PT    10/14/2022    9:41 AM    Future Appointments   Date Time Provider [place]   10/20/2022  9:30 AM Dola Factor, PT [facility]   10/22/2022 10:10 AM Florentina Addison, PT [facility]   10/27/2022  9:30 AM Dola Factor, PT [facility]   11/03/2022 10:40 AM Thana Farr, MD [facility] BS AMB   11/04/2022 10:50 AM Dola Factor, PT [facility]   11/06/2022 10:10 AM Florentina Addison, PT [facility]   12/01/2022   2:00 PM [place] [facility] BS AMB   12/03/2022 10:00 AM Thana Farr, MD IOC BS AMB   12/08/2022  9:15 AM Merdis Delay, DO [facility] BS AMB   12/31/2022  8:45 AM Carlyle Dolly, MD CAP BS AMB   01/05/2023  9:30 AM Merdis Delay, DO [facility] BS AMB   09/16/2023 10:20 AM Lessie Dings, PA-C [facility]

## 2022-10-20 ENCOUNTER — Inpatient Hospital Stay: Admit: 2022-10-20 | Payer: MEDICARE | Primary: Internal Medicine

## 2022-10-20 NOTE — Progress Notes (Signed)
PHYSICAL / OCCUPATIONAL THERAPY - DAILY TREATMENT NOTE (updated 1/23)    Patient Name: Elaine [name]    Date: 10/20/2022    DOB: Oct 07, 1951  Insurance: Payor: [employer] MEDICARE / Plan: [employer] GOLD PLUS HMO / Product Type: *No Product type* /      Patient DOB verified Yes     Visit #   Current / Total 3 16   Time   In / Out 9:34 10:10   Pain   In / Out Back 1, arm 3 Back + arm 2-3   Subjective Functional Status/Changes: Pt reports back is doing okay today. Has questions over HEP for technique.     TREATMENT AREA =  Other low back pain [M54.59]    OBJECTIVE         Therapeutic Procedures:    Tx Min Billable or 1:1 Min (if diff from Tx Min) Procedure, Rationale, Specifics   16  97110 Therapeutic Exercise (timed):  increase ROM, strength, coordination, balance, and proprioception to improve patient's ability to progress to PLOF and address remaining functional goals. (see flow sheet as applicable)     Details if applicable:       20  45409 Therapeutic Activity (timed):  use of dynamic activities replicating functional movements to increase ROM, strength, coordination, balance, and proprioception in order to improve patient's ability to progress to PLOF and address remaining functional goals.  (see flow sheet as applicable)     Details if applicable:            Details if applicable:            Details if applicable:            Details if applicable:     13  MC BC Totals Reminder: bill using total billable min of TIMED therapeutic procedures (example: do not include dry needle or estim unattended, both untimed codes, in totals to left)  8-22 min = 1 unit; 23-37 min = 2 units; 38-52 min = 3 units; 53-67 min = 4 units; 68-82 min = 5 units   Total Total     '[x]'$   Patient Education billed concurrently with other procedures   '[x]'$  Review HEP    '[]'$  Progressed/Changed HEP, detail:    '[]'$  Other detail:       Objective Information/Functional Measures/Assessment    VC required for proper form with mini squats to lessen strain on the  B knees. Increased reps for marches with good tolerance. Pt requires B UE support for balance. Cues for proper form with step training, cued to push through heels. Increased bridge sets with good tolerance.     Patient will continue to benefit from skilled PT / OT services to modify and progress therapeutic interventions, analyze and address functional mobility deficits, analyze and address ROM deficits, analyze and address strength deficits, analyze and cue for proper movement patterns, analyze and modify for postural abnormalities, and analyze and address imbalance/dizziness to address functional deficits and attain remaining goals.    Progress toward goals / Updated goals:  '[]'$   See Progress Note/Recertification    Short Term Goals: To be accomplished in  3-4  weeks  1. Independent with HEP.  EVAL: HEP established and given to patient   Current: Pt reports that she is doing her HEP  3 times/week , 10/14/22     2. Decrease pain to 1/10  to assist with  ADLs  and Community activities   EVAL:3 /10  Long Term Goals: To be accomplished in  6-8  weeks:  1.  Decrease  pain to 0/10  to assist with  ADLS and community activities   EVAL:  3/10      2.  To improve   BLE strength( B hip Flex / B Knee Flex/ Ext ) to 5/5 in order to assist with ADLS and  Community activities   EVAL:  3+/5      3.To improve  standing with feet Together for 10 seconds with decrease risk for falls   Eval : Feet together  0 seconds      Next PN due 10/31/22   RC due 11/30/22       PLAN  - Continue Plan of Care  - Upgrade activities as tolerated    Dola Factor, PT    10/20/2022    7:12 AM    Future Appointments   Date Time Provider [place]   10/20/2022  9:30 AM Dola Factor, PT [facility]   10/22/2022 10:10 AM Florentina Addison, PT [facility]   10/27/2022  9:30 AM Dola Factor, PT [facility]   11/03/2022 10:40 AM Thana Farr, MD I[facility] BS AMB   11/04/2022 10:50 AM Dola Factor, PT [facility]   11/06/2022 10:10 AM Florentina Addison, PT [facility]   12/01/2022  2:00 PM [facility] BS AMB   12/03/2022 10:00 AM Thana Farr, MD IOC BS AMB   12/08/2022  9:15 AM Merdis Delay, DO [facility] BS AMB   12/31/2022  8:45 AM Carlyle Dolly, MD CAP BS AMB   01/05/2023  9:30 AM Merdis Delay, DO [facility] BS AMB   09/16/2023 10:20 AM Lessie Dings, PA-C [facility]

## 2022-10-22 ENCOUNTER — Inpatient Hospital Stay: Admit: 2022-10-22 | Payer: MEDICARE | Primary: Internal Medicine

## 2022-10-22 NOTE — Progress Notes (Signed)
PHYSICAL / OCCUPATIONAL THERAPY - DAILY TREATMENT NOTE (updated 1/23)    Patient Name: Elaine [name]    Date: 10/22/2022    DOB: 03-15-1951  Insurance: Payor: [employer] MEDICARE / Plan: Mcarthur Rossetti GOLD PLUS HMO / Product Type: *No Product type* /      Patient DOB verified Yes     Visit #   Current / Total 4 16   Time   In / Out 1016 1100   Pain   In / Out 1 0   Subjective Functional Status/Changes: Patient complains of Low back pain today     TREATMENT AREA =  Other low back pain [M54.59]    OBJECTIVE         Therapeutic Procedures:    Tx Min Billable or 1:1 Min (if diff from Tx Min) Procedure, Rationale, Specifics   34 30 97110 Therapeutic Exercise (timed):  increase ROM, strength, coordination, balance, and proprioception to improve patient's ability to progress to PLOF and address remaining functional goals. (see flow sheet as applicable)     Details if applicable:              Details if applicable:     10 10 62952 Therapeutic Activity (timed):  use of dynamic activities replicating functional movements to increase ROM, strength, coordination, balance, and proprioception in order to improve patient's ability to progress to PLOF and address remaining functional goals.  (see flow sheet as applicable)     Details if applicable:            Details if applicable:            Details if applicable:     42 40 MC BC Totals Reminder: bill using total billable min of TIMED therapeutic procedures (example: do not include dry needle or estim unattended, both untimed codes, in totals to left)  8-22 min = 1 unit; 23-37 min = 2 units; 38-52 min = 3 units; 53-67 min = 4 units; 68-82 min = 5 units   Total Total     '[x]'$   Patient Education billed concurrently with other procedures   '[x]'$  Review HEP    '[]'$  Progressed/Changed HEP, detail:    '[]'$  Other detail:       Objective Information/Functional Measures/Assessment  Pt reports that she is doing her HEP  3 times/week. Pt is being educated to do her HEP everyday in order to get full benefits  of PT services .  Patient needs Vcs for proper technique for exercises as mentioned in therapy  flow sheet .  Marland KitchenPatient's pain has improved to 0/10 from 1/10 after PT Tx today .   Patient will continue to benefit from skilled PT / OT services to modify and progress therapeutic interventions, analyze and address functional mobility deficits, analyze and address ROM deficits, analyze and address strength deficits, analyze and address soft tissue restrictions, analyze and cue for proper movement patterns, and analyze and modify for postural abnormalities to address functional deficits and attain remaining goals.    Progress toward goals / Updated goals:  '[]'$   See Progress Note/Recertification    Short Term Goals: To be accomplished in  3-4  weeks  1. Independent with HEP.  EVAL: HEP established and given to patient   Current: Pt reports that she is doing her HEP  3 times/week , 10/14/22     2. Decrease pain to 1/10  to assist with  ADLs  and Community activities   EVAL:3 /10  Long Term Goals: To be accomplished in  6-8  weeks:  1.  Decrease  pain to 0/10  to assist with  ADLS and community activities   EVAL:  3/10      2.  To improve   BLE strength( B hip Flex / B Knee Flex/ Ext ) to 5/5 in order to assist with ADLS and  Community activities   EVAL:  3+/5      3.To improve  standing with feet Together for 10 seconds with decrease risk for falls   Eval : Feet together  0 seconds      Next PN due 10/31/22   RC due 11/30/22    PLAN  - Continue Plan of [place], PT    10/22/2022    10:31 AM    Future Appointments   Date Time Provider [place]   10/27/2022  9:30 AM Dola Factor, PT [facility]   11/03/2022 10:40 AM Thana Farr, MD I[facility] BS AMB   11/04/2022 10:50 AM Dola Factor, PT [facility]   11/06/2022 10:10 AM Florentina Addison, PT [facility]   12/01/2022  2:00 PM [place] [facility] BS AMB   12/03/2022 10:00 AM Thana Farr, MD I[facility] BS AMB   12/08/2022  9:15 AM Garner Nash, Viona Gilmore, DO [facility] BS  AMB   12/31/2022  8:45 AM Carlyle Dolly, MD CAP BS AMB   01/05/2023  9:30 AM Merdis Delay, DO [facility] BS AMB   09/16/2023 10:20 AM Lessie Dings, PA-C [facility]

## 2022-10-27 ENCOUNTER — Inpatient Hospital Stay: Admit: 2022-10-27 | Payer: MEDICARE | Primary: Internal Medicine

## 2022-10-27 NOTE — Progress Notes (Signed)
PHYSICAL / OCCUPATIONAL THERAPY - DAILY TREATMENT NOTE (updated 1/23)    Patient Name: Elaine [name]    Date: 10/27/2022    DOB: 05-05-1951  Insurance: Payor: HU[employer]NA MEDICARE / Plan: [place] HMO / Product Type: *No Product type* /      Patient DOB verified Yes     Visit #   Current / Total 5 16   Time   In / Out 9:31 10:09   Pain   In / Out 0 3 (L hip)   Subjective Functional Status/Changes: Pt reports her knee is feeling a lot better.  Low back pain comes and goes.      TREATMENT AREA =  Other low back pain [M54.59]    OBJECTIVE         Therapeutic Procedures:    Tx Min Billable or 1:1 Min (if diff from Tx Min) Procedure, Rationale, Specifics   25  97110 Therapeutic Exercise (timed):  increase ROM, strength, coordination, balance, and proprioception to improve patient's ability to progress to PLOF and address remaining functional goals. (see flow sheet as applicable)     Details if applicable:       13  01751 Therapeutic Activity (timed):  use of dynamic activities replicating functional movements to increase ROM, strength, coordination, balance, and proprioception in order to improve patient's ability to progress to PLOF and address remaining functional goals.  (see flow sheet as applicable)     Details if applicable:            Details if applicable:            Details if applicable:            Details if applicable:     62  MC BC Totals Reminder: bill using total billable min of TIMED therapeutic procedures (example: do not include dry needle or estim unattended, both untimed codes, in totals to left)  8-22 min = 1 unit; 23-37 min = 2 units; 38-52 min = 3 units; 53-67 min = 4 units; 68-82 min = 5 units   Total Total     '[x]'$   Patient Education billed concurrently with other procedures   '[x]'$  Review HEP    '[]'$  Progressed/Changed HEP, detail:    '[]'$  Other detail:       Objective Information/Functional Measures/Assessment    Pt tolerated increase in sets of step ups. Proved cues for proper form with stair  training to increase LE engagement. Progressed marches to uneven surfaces for increase challenge, pt required B UE support. Cues throughout session for upright posture. Progressed core stabilization with TrA brace with march.     Patient will continue to benefit from skilled PT / OT services to modify and progress therapeutic interventions, analyze and address functional mobility deficits, analyze and address ROM deficits, analyze and address strength deficits, analyze and address soft tissue restrictions, analyze and cue for proper movement patterns, analyze and modify for postural abnormalities, and analyze and address imbalance/dizziness to address functional deficits and attain remaining goals.    Progress toward goals / Updated goals:  '[]'$   See Progress Note/Recertification    Short Term Goals: To be accomplished in  3-4  weeks  1. Independent with HEP.  EVAL: HEP established and given to patient   Current: Pt reports that she is doing her HEP  3 times/week , 10/14/22     2. Decrease pain to 1/10  to assist with  ADLs  and Community activities   EVAL:3 /  10      Long Term Goals: To be accomplished in  6-8  weeks:  1.  Decrease  pain to 0/10  to assist with  ADLS and community activities   EVAL:  3/10      2.  To improve   BLE strength( B hip Flex / B Knee Flex/ Ext ) to 5/5 in order to assist with ADLS and  Community activities   EVAL:  3+/5      3.To improve  standing with feet Together for 10 seconds with decrease risk for falls   Eval : Feet together  0 seconds      Next PN due 10/31/22   RC due 11/30/22       PLAN  - Continue Plan of Care  - Upgrade activities as tolerated    Dola Factor, PT    10/27/2022    7:16 AM    Future Appointments   Date Time Provider [place]   10/27/2022  9:30 AM Dola Factor, PT [facility]   11/03/2022 10:40 AM Thana Farr, MD [facility] BS AMB   11/04/2022 10:50 AM Dola Factor, PT [facility]   11/06/2022 10:10 AM Florentina Addison, PT [facility]   12/01/2022  2:00 PM [place]  [facility] BS AMB   12/03/2022 10:00 AM Thana Farr, MD [facility] BS AMB   12/08/2022  9:15 AM Merdis Delay, DO [facility] BS AMB   12/31/2022  8:45 AM Carlyle Dolly, MD CAP BS AMB   01/05/2023  9:30 AM Merdis Delay, DO [facility] BS AMB   09/16/2023 10:20 AM Lessie Dings, PA-C [facility]

## 2022-10-28 ENCOUNTER — Ambulatory Visit: Payer: PRIVATE HEALTH INSURANCE | Primary: Internal Medicine

## 2022-11-03 ENCOUNTER — Ambulatory Visit: Admit: 2022-11-03 | Discharge: 2022-11-03 | Payer: MEDICARE | Attending: Internal Medicine | Primary: Internal Medicine

## 2022-11-03 DIAGNOSIS — I10 Essential (primary) hypertension: Secondary | ICD-10-CM

## 2022-11-03 NOTE — Progress Notes (Signed)
Elaine [name] (DOB:  02-12-1951) is a 71 y.o. female established patient, here for evaluation of the following chief complaint(s):  Blood Pressure Check and Back Pain (4 week follow up )         ASSESSMENT/PLAN:    ICD-10-CM    1. Essential (primary) hypertension  I10 Much improved with increasing to Diovan 320 mg daily.      2. 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      3. Iron deficiency anemia due to chronic blood loss  D50.0 Patient continues to follow up with GI and hematology for further management      4. Acute midline low back pain with bilateral sciatica  M54.42 Patient with slow improvement with physical therapy and will follow-up with the spine center for further management    M54.41              Return if symptoms worsen or fail to improve.         Subjective   SUBJECTIVE/OBJECTIVE:  Patient comes in for follow-up of low back pain and sciatica.  She continues to have  back pain with standing that has mildly improved with PT which she continues.  Patient is made decision to as a Pharmacist, hospital.  She will therefore continue physical therapy and follow-up with the spine center for further management of her back pain and sciatica.  Patient's high blood pressure is much improved with increasing to Diovan 320 mg daily.  Patient denies any side effects with the medication and will have her renal function checked in the next 6 weeks when she has her preop testing done for left total hip replacement.    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  Her TSH  is very low on Synthroid 150 mcg daily .  She was decreased to Synthroid 100 mcg daily but continues to take the 150 tablet 2-3 times a week to finish out the medication.  Will check her TSH in the next 6 weeks when she has her preop blood work done as mentioned above     Respiratory ROS: negative  for - shortness of breath  Cardiovascular ROS: negative for - chest pain       Objective   BP 136/69 (Site: Left Upper Arm, Position: Sitting, Cuff Size: Large Adult)   Pulse 82   Temp 97.7 F (36.5 C) (Temporal)   Resp 20   Ht 1.575 m ('5\' 2"'$ )   Wt 70.8 kg (156 lb)   SpO2 97%   BMI 28.53 kg/m   Chest - clear to auscultation, no wheezes, rales or rhonchi, symmetric air entry  Heart - normal rate, regular rhythm, normal S1, S2, no murmurs, rubs, clicks or gallops       Current Outpatient Medications   Medication Sig    valsartan (DIOVAN) 320 MG tablet Take 1 tablet by mouth daily    levothyroxine (SYNTHROID) 100 MCG tablet Take 1 tablet by mouth daily    atorvastatin (LIPITOR) 40 MG tablet TAKE 1 TABLET EVERY DAY (NEED MD APPOINTMENT)    terbinafine (LAMISIL) 250 MG tablet TAKE 1 TABLET EVERY DAY    montelukast (SINGULAIR) 10 MG tablet     albuterol sulfate HFA (PROVENTIL;VENTOLIN;PROAIR) 108 (90 Base) MCG/ACT inhaler Inhale 2 puffs into the lungs every 4 hours as needed for Wheezing or Shortness of Breath  predniSONE 10 MG (21) TBPK Use as directed    cyanocobalamin 1000 MCG tablet Take 2.5 tablets by mouth daily    ZINC PO Take 1 tablet by mouth daily    vitamin D3 (CHOLECALCIFEROL) 125 MCG (5000 UT) TABS tablet Take 1 tablet by mouth daily    iron dextran complex (INFED) 50 MG/ML injection Infuse 19.5 mLs intravenously Pt gets these every other month    latanoprost (XALATAN) 0.005 % ophthalmic solution Place 1 drop into both eyes nightly    lidocaine (LIDODERM) 5 % Place 1 patch onto the skin every 24 hours    pantoprazole (PROTONIX) 40 MG tablet Take 1 tablet by mouth daily    potassium chloride (KLOR-CON M) 10 MEQ extended release tablet TAKE 1 TABLET EVERY DAY    gabapentin (NEURONTIN) 300 MG capsule TAKE 1 CAPSULE EVERY MORNING  AND TAKE 2 CAPSULES EVERY NIGHT     No current facility-administered medications for this visit.       TSH:    Lab Results   Component Value Date/Time    TSH 0.018 09/29/2022  12:00 AM     FT4:   Lab Results   Component Value Date/Time    T4FREE 2.29 09/29/2022 12:00 AM              An electronic signature was used to authenticate this note.    --Pierra Skora Vear Clock, MD

## 2022-11-03 NOTE — Progress Notes (Signed)
[name] presents today for   Chief Complaint   Patient presents with    Blood Pressure Check    Back Pain     4 week follow up                  1. "Have you been to the ER, urgent care clinic since your last visit?  Hospitalized since your last visit?" no    2. "Have you seen or consulted any other health care providers outside of the [facility] since your last visit?" Yes, Dr. Vonzell Schlatter.     3. For patients aged 72-75: Has the patient had a colonoscopy / FIT/ Cologuard? Yes - no Care Gap present      If the patient is female:    4. For patients aged 74-74: Has the patient had a mammogram within the past 2 years? Yes - no Care Gap present      5. For patients aged 21-65: Has the patient had a pap smear? NA - based on age or sex

## 2022-11-04 ENCOUNTER — Inpatient Hospital Stay: Admit: 2022-11-04 | Payer: MEDICARE | Primary: Internal Medicine

## 2022-11-04 NOTE — Progress Notes (Signed)
PHYSICAL / OCCUPATIONAL THERAPY - DAILY TREATMENT NOTE (updated 1/23)    Patient Name: Elaine [name]    Date: 11/04/2022    DOB: [birth year]  Insurance: Payor: HUMANA MEDICARE / Plan: [employer] GOLD PLUS HMO / Product Type: *No Product type* /      Patient DOB verified Yes     Visit #   Current / Total 6 16   Time   In / Out 11:00 11:34   Pain   In / Out 0 0   Subjective Functional Status/Changes: PN completed this date.      TREATMENT AREA =  Other low back pain [M54.59]    OBJECTIVE         Therapeutic Procedures:    Tx Min Billable or 1:1 Min (if diff from Tx Min) Procedure, Rationale, Specifics   24  97530 Therapeutic Activity (timed):  use of dynamic activities replicating functional movements to increase ROM, strength, coordination, balance, and proprioception in order to improve patient's ability to progress to PLOF and address remaining functional goals.  (see flow sheet as applicable)     Details if applicable:  assessed goals and FOTO     10  97110 Therapeutic Exercise (timed):  increase ROM, strength, coordination, balance, and proprioception to improve patient's ability to progress to PLOF and address remaining functional goals. (see flow sheet as applicable)     Details if applicable:            Details if applicable:            Details if applicable:            Details if applicable:     29  MC BC Totals Reminder: bill using total billable min of TIMED therapeutic procedures (example: do not include dry needle or estim unattended, both untimed codes, in totals to left)  8-22 min = 1 unit; 23-37 min = 2 units; 38-52 min = 3 units; 53-67 min = 4 units; 68-82 min = 5 units   Total Total     _0   Patient Education billed concurrently with other procedures   _1  Review HEP    _2  Progressed/Changed HEP, detail:    _3  Other detail:       Objective Information/Functional Measures/Assessment    Pt reports improved pain tolerance with exercises and walking. However, pt continued to lack tolerance to walking and  standing and 10-15 minutes. Pt demonstrated minor improvement in FOTO score from 45 to 47 for functional mobility improvement. Pt has demonstrated improve standing balance. Pt educated on importance of compliance to HEP and consistency to see improvements in symptoms. Pt provided updated HEP for LE strengthening.     Patient will continue to benefit from skilled PT / OT services to modify and progress therapeutic interventions, analyze and address functional mobility deficits, analyze and address ROM deficits, analyze and address strength deficits, analyze and address soft tissue restrictions, analyze and cue for proper movement patterns, analyze and modify for postural abnormalities, and analyze and address imbalance/dizziness to address functional deficits and attain remaining goals.    Progress toward goals / Updated goals:  _4   See Progress Note/Recertification       Short Term Goals: To be accomplished in  3-4  weeks  1. Independent with HEP.  EVAL: HEP established and given to patient   PN; Pt reports that she is doing her HEP  3 times/week , 1129/23 Progressing     2. Decrease pain to 1/10  to  assist with  ADLs  and Community activities   EVAL:3 /10   PN: 3/10 average between good and bad days. 10/26/22 no change     Long Term Goals: To be accomplished in  6-8  weeks:  1.  Decrease  pain to 0/10  to assist with  ADLS and community activities   EVAL:  3/10   PN: 3/10 average between good and bad days. 10/26/22 no change     2.  To improve   BLE strength( B hip Flex / B Knee Flex/ Ext ) to 5/5 in order to assist with ADLS and  Community activities   EVAL:  3+/5   PN: gross MMT of 4/5 progressing     3.To improve  standing with feet Together for 10 seconds with decrease risk for falls   Eval : Feet together  0 seconds   PN: 10 seconds MET          RC due 11/30/22    PLAN  - Continue Plan of Care  - Upgrade activities as tolerated    Dola Factor, PT    11/04/2022    7:49 AM    Future Appointments   Date Time  Provider [place]   11/04/2022 10:50 AM Dola Factor, PT [facility]   11/06/2022 10:10 AM Florentina Addison, PT [facility]   12/01/2022  2:00 PM [place] [facility] BS AMB   12/03/2022 10:00 AM Thana Farr, MD [facility] BS AMB   12/08/2022  9:15 AM [place], Viona Gilmore, DO [facility] BS AMB   12/31/2022  8:45 AM Carlyle Dolly, MD CAP BS AMB   01/05/2023  9:30 AM Merdis Delay, DO [facility] BS AMB   09/16/2023 10:20 AM Lessie Dings, PA-C [facility]

## 2022-11-04 NOTE — Progress Notes (Signed)
[facility]  [address] [place] [address]  [place] SUMMARY  Patient Name: Elaine [name] DOB: 12-10-1950   Treatment/Medical Diagnosis: Other low back pain [M54.59]   Referral Source: Thana Farr, MD     Date of Initial Visit: 10/01/22 Attended Visits: 6 Missed Visits: 4     SUMMARY OF TREATMENT  Patient seen for eval and 5 follow up sessions. Please see 11/04/22 MD progress note. She has had 4 missed sessions and no further contact. She is being DC at this time.       CURRENT STATUS   Short Term Goals: To be accomplished in  3-4  weeks  1. Independent with HEP.  EVAL: HEP established and given to patient   PN; Pt reports that she is doing her HEP  3 times/week , 1129/23 Progressing     2. Decrease pain to 1/10  to assist with  ADLs  and Community activities   EVAL:3 /10   PN: 3/10 average between good and bad days. 10/26/22 no change     Long Term Goals: To be accomplished in  6-8  weeks:  1.  Decrease  pain to 0/10  to assist with  ADLS and community activities   EVAL:  3/10   PN: 3/10 average between good and bad days. 10/26/22 no change     2.  To improve   BLE strength( B hip Flex / B Knee Flex/ Ext ) to 5/5 in order to assist with ADLS and  Community activities   EVAL:  3+/5   PN: gross MMT of 4/5 progressing     3.To improve  standing with feet Together for 10 seconds with decrease risk for falls   Eval : Feet together  0 seconds   PN: 10 seconds MET       RECOMMENDATIONS  Discontinue therapy due to lack of appreciable progress towards goals.   No further contact after missed sessions and phone messages left.     If you have any questions/comments please contact us directly at [phone].   Thank you for allowing [place] to assist in the care of your patient.    Forrest Moron, PT       11/17/2022       9:12 AM    Not Medicaid Ins, no signature required for DC

## 2022-11-04 NOTE — Other (Signed)
[facility]  [address] [place] [address]  MB:[phone]  PHYSICAL THERAPY PROGRESS NOTE  Patient Name: Elaine [name] DOB: [birth year]   Treatment/Medical Diagnosis: Other low back pain [M54.59]   Referral Source: Thana Farr, MD     Date of Initial Visit: 09/30/22 Attended Visits: 6 Missed Visits: 0     SUMMARY OF TREATMENT  Treatment is a combination of therapeutic activities, therapeutic exercise, and neuro re-education to address low back pain. Pt has attended 6 visit including the IE, no missed visits at this time.     CURRENT STATUS  Pt reports improved pain tolerance with exercises and walking. However, pt continued to lack tolerance to walking and standing and 10-15 minutes. Pt demonstrated minor improvement in FOTO score from 45 to 47 for functional mobility improvement. Pt has demonstrated improve standing balance. Pt educated on importance of compliance to HEP and consistency to see improvements in symptoms.     Short Term Goals: To be accomplished in  3-4  weeks  1. Independent with HEP.  EVAL: HEP established and given to patient   PN; Pt reports that she is doing her HEP  3 times/week , 1129/23 Progressing    2. Decrease pain to 1/10  to assist with  ADLs  and Community activities   EVAL:3 /10   PN: 3/10 average between good and bad days. 10/26/22 no change     Long Term Goals: To be accomplished in  6-8  weeks:  1.  Decrease  pain to 0/10  to assist with  ADLS and community activities   EVAL:  3/10   PN: 3/10 average between good and bad days. 10/26/22 no change     2.  To improve   BLE strength( B hip Flex / B Knee Flex/ Ext ) to 5/5 in order to assist with ADLS and  Community activities   EVAL:  3+/5   PN: gross MMT of 4/5 progressing     3.To improve  standing with feet Together for 10 seconds with decrease risk for falls   Eval : Feet together  0 seconds   PN: 10 seconds MET    Functional Gains: ability to  move without pain is better, improve pain during exercises and walking. More self aware for upright posture.    Functional Deficits: still having difficulty with walking upright (is a forced effort), walking tolerance of 15 minutes, standing tolerance 10 minutes.     % improvement: 50%  Pain   Average: 3/10       Best: 0/10     Worst: 8-9/10  Patient Goal: "do her exercises, to have something to work for long term"    Payor: HU[employer] MEDICARE / Plan: [employer] GOLD PLUS HMO / Product Type: *No Product type* /     Medicare, cannot change goals, cannot adjust frequency/duration, no signature required   Reporting Period: (date from last Prog Note/Eval to current Prog Note/Recert)  25/49/82 - 64/15/83    RECOMMENDATIONS  Continue therapy per initial Plan of Care or most recent Medicare Recert.      If you have any questions/comments please contact us directly.  Thank you for allowing [place] to assist in the care of your patient.    Dola Factor, PT       11/04/2022       7:51 AM

## 2022-11-06 ENCOUNTER — Inpatient Hospital Stay: Payer: MEDICARE | Primary: Internal Medicine

## 2022-11-11 ENCOUNTER — Inpatient Hospital Stay: Payer: MEDICARE | Primary: Internal Medicine

## 2022-11-13 ENCOUNTER — Inpatient Hospital Stay: Payer: MEDICARE | Primary: Internal Medicine

## 2022-11-13 NOTE — Telephone Encounter (Signed)
See call log

## 2022-11-16 ENCOUNTER — Inpatient Hospital Stay: Payer: MEDICARE | Primary: Internal Medicine

## 2022-11-18 ENCOUNTER — Encounter: Payer: MEDICARE | Primary: Internal Medicine

## 2022-11-23 ENCOUNTER — Encounter

## 2022-11-24 MED ORDER — POTASSIUM CHLORIDE CRYS ER 10 MEQ PO TBCR
10 | ORAL_TABLET | ORAL | 3 refills | Status: AC
Start: 2022-11-24 — End: ?

## 2022-11-24 NOTE — Telephone Encounter (Signed)
Please fill or refuse as appropriate.    LOV: 11/03/22    NOV: 12/03/22

## 2022-11-25 MED ORDER — GABAPENTIN 300 MG PO CAPS
300 | ORAL_CAPSULE | Freq: Three times a day (TID) | ORAL | 1 refills | Status: DC
Start: 2022-11-25 — End: 2023-08-17

## 2022-12-01 ENCOUNTER — Ambulatory Visit: Payer: MEDICARE | Primary: Internal Medicine

## 2022-12-02 ENCOUNTER — Inpatient Hospital Stay: Admit: 2022-12-02 | Payer: MEDICARE | Primary: Internal Medicine

## 2022-12-02 ENCOUNTER — Encounter: Payer: MEDICARE | Primary: Internal Medicine

## 2022-12-02 ENCOUNTER — Ambulatory Visit: Admit: 2022-12-02 | Discharge: 2022-12-02 | Payer: MEDICARE | Primary: Internal Medicine

## 2022-12-02 VITALS — BP 139/68 | Ht 62.0 in | Wt 156.0 lb

## 2022-12-02 DIAGNOSIS — Z01818 Encounter for other preprocedural examination: Secondary | ICD-10-CM

## 2022-12-02 DIAGNOSIS — M1612 Unilateral primary osteoarthritis, left hip: Secondary | ICD-10-CM

## 2022-12-02 DIAGNOSIS — I5032 Chronic diastolic (congestive) heart failure: Principal | ICD-10-CM

## 2022-12-02 LAB — COMPREHENSIVE METABOLIC PANEL
ALT: 22 U/L (ref 13–56)
AST: 12 U/L (ref 10–38)
Albumin/Globulin Ratio: 1.2 (ref 0.8–1.7)
Albumin: 3.4 g/dL (ref 3.4–5.0)
Alk Phosphatase: 79 U/L (ref 45–117)
Anion Gap: 3 mmol/L (ref 3.0–18)
BUN/Creatinine Ratio: 9 — ABNORMAL LOW (ref 12–20)
BUN: 5 MG/DL — ABNORMAL LOW (ref 7.0–18)
CO2: 33 mmol/L — ABNORMAL HIGH (ref 21–32)
Calcium: 9.4 MG/DL (ref 8.5–10.1)
Chloride: 106 mmol/L (ref 100–111)
Creatinine: 0.54 MG/DL — ABNORMAL LOW (ref 0.6–1.3)
Est, Glom Filt Rate: >60 mL/min/{1.73_m2} (ref >60)
Globulin: 2.9 g/dL (ref 2.0–4.0)
Glucose: 82 mg/dL (ref 74–99)
Potassium: 4 mmol/L (ref 3.5–5.5)
Sodium: 142 mmol/L (ref 136–145)
Total Bilirubin: 0.6 MG/DL (ref 0.2–1.0)
Total Protein: 6.3 g/dL — ABNORMAL LOW (ref 6.4–8.2)

## 2022-12-02 LAB — URINE DRUG SCREEN
Amphetamine, Urine: NEGATIVE
Barbiturates, Urine: NEGATIVE
Benzodiazepines, Urine: NEGATIVE
Cocaine, Urine: NEGATIVE
Methadone, Urine: NEGATIVE
Opiates, Urine: NEGATIVE
Phencyclidine, Urine: NEGATIVE
THC, TH-Cannabinol, Urine: NEGATIVE

## 2022-12-02 LAB — ECHO (TTE) COMPLETE (PRN CONTRAST/BUBBLE/STRAIN/3D)
Ao Root Index: 1.98 cm/m2
Aortic Root: 3.4 cm
Ascending Aorta Index: 1.63 cm/m2
Ascending Aorta: 2.8 cm
Body Surface Area: 1.76 m2
Fractional Shortening 2D: 29 % (ref 28–44)
IVSd: 1.5 cm — AB (ref 0.6–0.9)
LA Volume A/L: 84 mL
LA Volume Index A/L: 49 mL/m2 (ref 16–34)
LA Volume Index MOD A2C: 38 ml/m2 — AB (ref 16–34)
LA Volume Index MOD A4C: 55 ml/m2 — AB (ref 16–34)
LA Volume MOD A2C: 66 mL — AB (ref 22–52)
LA Volume MOD A4C: 95 mL — AB (ref 22–52)
LV Mass 2D Index: 132.7 g/m2 — AB (ref 43–95)
LV Mass 2D: 228.3 g — AB (ref 67–162)
LV RWT Ratio: 0.84
LVIDd Index: 2.21 cm/m2
LVIDd: 3.8 cm — AB (ref 3.9–5.3)
LVIDs Index: 1.57 cm/m2
LVIDs: 2.7 cm
LVOT Area: 3.1 cm2
LVOT Diameter: 2 cm
LVOT Mean Gradient: 1 mmHg
LVOT Peak Gradient: 3 mmHg
LVOT Peak Velocity: 0.8 m/s
LVOT SV: 41.4 ml
LVOT Stroke Volume Index: 24.1 mL/m2
LVOT VTI: 13.2 cm
LVPWd: 1.6 cm — AB (ref 0.6–0.9)
MV A Velocity: 0.99 m/s
MV Area by PHT: 3.3 cm2
MV E Velocity: 0.55 m/s
MV E Wave Deceleration Time: 230.3 ms
MV E/A: 0.56
MV PHT: 66.8 ms
PASP: 24 mmHg
RVIDd: 4 cm
TR Max Velocity: 2.32 m/s
TR Peak Gradient: 21 mmHg

## 2022-12-02 LAB — URINALYSIS
Bilirubin Urine: NEGATIVE
Blood, Urine: NEGATIVE
Glucose, UA: NEGATIVE mg/dL
Ketones, Urine: NEGATIVE mg/dL
Leukocyte Esterase, Urine: NEGATIVE
Nitrite, Urine: NEGATIVE
Protein, UA: NEGATIVE mg/dL
Specific Gravity, UA: 1.009 (ref 1.005–1.030)
Urobilinogen, Urine: 1 EU/dL (ref 0.2–1.0)
pH, Urine: 7 (ref 5.0–8.0)

## 2022-12-02 LAB — CBC WITH AUTO DIFFERENTIAL
Basophils %: 1 % (ref 0–2)
Basophils Absolute: 0 10*3/uL (ref 0.0–0.1)
Eosinophils %: 2 % (ref 0–5)
Eosinophils Absolute: 0.1 10*3/uL (ref 0.0–0.4)
Hematocrit: 31.6 % — ABNORMAL LOW (ref 35.0–45.0)
Hemoglobin: 9.7 g/dL — ABNORMAL LOW (ref 12.0–16.0)
Immature Granulocytes %: 0 % (ref 0.0–0.5)
Immature Granulocytes Absolute: 0 10*3/uL (ref 0.00–0.04)
Lymphocytes %: 21 % (ref 21–52)
Lymphocytes Absolute: 0.8 10*3/uL — ABNORMAL LOW (ref 0.9–3.6)
MCH: 24.3 PG (ref 24.0–34.0)
MCHC: 30.7 g/dL — ABNORMAL LOW (ref 31.0–37.0)
MCV: 79 FL (ref 78.0–100.0)
MPV: 10.7 FL (ref 9.2–11.8)
Monocytes %: 10 % (ref 3–10)
Monocytes Absolute: 0.4 10*3/uL (ref 0.05–1.2)
Neutrophils %: 66 % (ref 40–73)
Neutrophils Absolute: 2.4 10*3/uL (ref 1.8–8.0)
Nucleated RBCs: 0 PER 100 WBC
Platelets: 355 10*3/uL (ref 135–420)
RBC: 4 M/uL — ABNORMAL LOW (ref 4.20–5.30)
RDW: 19.7 % — ABNORMAL HIGH (ref 11.6–14.5)
WBC: 3.7 10*3/uL — ABNORMAL LOW (ref 4.6–13.2)
nRBC: 0 10*3/uL (ref 0.00–0.01)

## 2022-12-02 LAB — APTT: APTT: 30.9 s (ref 23.0–36.4)

## 2022-12-02 LAB — HEMOGLOBIN A1C
Estimated Avg Glucose: 97 mg/dL
Hemoglobin A1C: 5 % (ref 4.2–5.6)

## 2022-12-02 LAB — PROTIME-INR
INR: 1 (ref 0.9–1.1)
Protime: 12.9 s (ref 11.9–14.7)

## 2022-12-02 NOTE — Other (Signed)
Instructions for your surgery at [facility] Date:  12/02/2022      Patient's Name:  Elaine [name]           Surgery Date:  12/23/2022              Please enter the main entrance of the hospital and check-in at the front desk located in the lobby. Once checked in at the front desk, you will take the elevators to the second floor, and report to the waiting room on the left. The room will say Procedure Registration.    Do NOT eat or drink anything, including candy, gum, or ice chips after midnight prior to your surgery, unless you have specific instructions from your surgeon or anesthesia provider to do so.  Brush your teeth before coming to the hospital. You may swish with water, but do not swallow.  No smoking/Vaping/E-Cigarettes 24 hours prior to the day of surgery.  No alcohol 24 hours prior to the day of surgery.  No recreational drugs for one week prior to the day of surgery.  Bring Photo ID, Insurance information, and Co-pay if required on day of surgery.  Bring in pertinent legal documents, such as, Medical Power of [place], DNR, Advance Directive, etc.  Leave all valuables, including money/purse, at home.  Remove all jewelry, including ALL body piercings, nail polish, acrylic nails, and makeup (including mascara); no lotions, powders, deodorant, or perfume/cologne/after shave on the skin.  Follow instruction for Hibiclens washes and CHG wipes from surgeon's office.   Glasses and dentures may be worn to the hospital. They must be removed prior to surgery. Please bring case/container for glasses or dentures.   Contact lenses should not be worn on day of surgery.   Call your doctor's office if symptoms of a cold or illness develop within 24-48 hours prior to your surgery.  Call your doctor's office if you have any questions concerning insurance or co-pays.  15. AN ADULT (relative or friend 88 years or older) [place].  16. Please make  arrangements for a responsible adult (18 years or older) to be with you for 24 hours after your surgery.   30. ONE VISITOR will be allowed in the waiting area during your surgery.  Exceptions may be made for surgical admissions, per nursing unit guidelines      Special Instructions:      Bring a list of CURRENT medications.  Follow instructions from the office regarding Blood Thinners and/or Insulin  Follow instructions from the office regarding medications to take the morning of surgery.   Bring inhaler.  Bring CPAP machine.  Bring walker    On day of surgery if you are running late, unable to make procedure time, or sick, please call the Pre-op department at [phone]    These surgical instructions were reviewed with patient during the PAT phone call.

## 2022-12-03 ENCOUNTER — Ambulatory Visit: Admit: 2022-12-03 | Discharge: 2022-12-03 | Payer: MEDICARE | Attending: Internal Medicine | Primary: Internal Medicine

## 2022-12-03 DIAGNOSIS — I10 Essential (primary) hypertension: Secondary | ICD-10-CM

## 2022-12-03 LAB — NICOTINE AND METABOLITES, URINE: Cotinine Screen, Ur: NEGATIVE ng/mL

## 2022-12-03 MED ORDER — AMLODIPINE BESYLATE 5 MG PO TABS
5 | ORAL_TABLET | Freq: Every day | ORAL | 3 refills | Status: DC
Start: 2022-12-03 — End: 2022-12-30

## 2022-12-03 NOTE — Progress Notes (Signed)
Preoperative Evaluation    Date of Exam: 12/03/2022    Elaine [name] is a 71 y.o. female (DOB:[birth year]) who presents for preoperative evaluation.   Procedure/Surgery:Left Hip Arthroplasty  Date of Procedure/Surgery: 12/23/2022  Surgeon: Dr Garner Nash  Hospital/Surgical Facility: [facility]    Primary Physician: Thana Farr, MD    HPI: Patient presents for medical clearance for orthopedic surgery.  [name] HTN is uncontrolled on  Diovan 320 mg. Will add Norvasc 5 mg to get under better control and pt to monitor BP at home on daily basis.   [name] high cholesterol has been fairly well controlled on Lipitor 40 mg. Pt  denies any side effects like myalgias. .  Pt has been controlling her prediabetes with diet and weight loss.      Thyroid Review:  Patient is seen for followup of hypothyroidism.   Thyroid ROS: denies fatigue, weight changes, heat/cold intolerance, bowel/skin changes or CVS symptoms. 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  Her TSH is stable on Synthroid 100 mcg daily on a regular basis. .        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  She has had EGD/colonoscopy and pill endoscopy in the past    [name] vit D level is  good on vit D 5000 units/day.     Patient has sleep apnea and is using a CPAP machine and followed by sleep specialist    Patient has asthma that is controlled on Singulair and  trelegy Ellipta.  Patient is followed by pulmonary who also sees her for sleep apnea.    Patient has chronic diastolic heart failure which is stable and followed by cardiology    Patient overall is at  moderate risk for this surgery and is medically cleared to proceed.        Medical History:     Past Medical History:   Diagnosis Date    Anemia     Asthma     Colon polyp     DDD  (degenerative disc disease) 02/15/2009    Depression 12/18/2011    DVT (deep venous thrombosis) ([place]) 10/23/2009    Left leg:  No DVT.      Essential hypertension, benign     History of blood transfusion     History of echocardiogram 03/20/2004    EF 70%.  Borderline DDfx.  No significant valvular pathology.    Hypercholesterolemia     Hypertension     Iron deficiency anemia 1/61/0960    Lichen planus 4/54/0981    Meralgia paraesthetica 10/22/2013    OA (osteoarthritis of spine) 02/15/2009    OA (osteoarthritis of spine) 02/15/2009    Obesity, unspecified     Other and unspecified hyperlipidemia     Pre-operative cardiovascular examination     For spine surgery    Right sided sciatica     Shortness of breath     Possible asthma, HCVD; less likely CAD (Noted 03/15/09)    Sleep apnea 02/15/2009    uses cpap machine    Thallium stress test abnormal 03/20/2004    Partially transient, mod basal & mid anterior defect most c/w artifact; mild anterior ischemia less likely.  Neg EKG on pharm stress test.    Thyroid disease     hypothyroidism  Allergies:     Allergies   Allergen Reactions    Aspirin Other (See Comments)     GI distress,     Diphenhydramine Other (See Comments)     Muscle jerking    Pollen Extract      Other reaction(s): Not Reported This Time      Medications:     Current Outpatient Medications   Medication Sig    amLODIPine (NORVASC) 5 MG tablet Take 1 tablet by mouth daily    Misc Natural Products (NEURIVA PO) Take 1 tablet by mouth daily    NONFORMULARY Viviscal -1 tablet twice a day for hair and scalp    gabapentin (NEURONTIN) 300 MG capsule Take 1 capsule by mouth 3 times daily for 180 days. Max Daily Amount: 900 mg    potassium chloride (KLOR-CON M) 10 MEQ extended release tablet TAKE 1 TABLET EVERY DAY    valsartan (DIOVAN) 320 MG tablet Take 1 tablet by mouth daily    levothyroxine (SYNTHROID) 100 MCG tablet Take 1 tablet by mouth daily    atorvastatin (LIPITOR) 40 MG tablet TAKE 1 TABLET EVERY DAY (NEED  MD APPOINTMENT)    montelukast (SINGULAIR) 10 MG tablet     albuterol sulfate HFA (PROVENTIL;VENTOLIN;PROAIR) 108 (90 Base) MCG/ACT inhaler Inhale 2 puffs into the lungs every 4 hours as needed for Wheezing or Shortness of Breath    cyanocobalamin 1000 MCG tablet Take 2.5 tablets by mouth daily    ZINC PO Take 1 tablet by mouth daily    vitamin D3 (CHOLECALCIFEROL) 125 MCG (5000 UT) TABS tablet Take 1 tablet by mouth daily    iron dextran complex (INFED) 50 MG/ML injection Infuse 19.5 mLs intravenously Pt gets these every other month    latanoprost (XALATAN) 0.005 % ophthalmic solution Place 1 drop into both eyes nightly    lidocaine (LIDODERM) 5 % Place 1 patch onto the skin every 24 hours    pantoprazole (PROTONIX) 40 MG tablet Take 1 tablet by mouth daily     No current facility-administered medications for this visit.     Surgical History:     Past Surgical History:   Procedure Laterality Date    COLONOSCOPY N/A 04/25/2020    SCREENING COLONOSCOPY performed by Sloan Leiter, MD at [facility] ENDOSCOPY    COLONOSCOPY  02-24-12    normal (Dr. Angelique Holm)    COLONOSCOPY FLX DX W/COLLJ [facility] WHEN PFRMD  02-2007    +polyp(tubular adenoma); Dr Angelique Holm    HERNIA REPAIR      3x    HYSTERECTOMY ([place])      NEUROLOGICAL SURGERY  03-16-2009    s/p ACD & Fusion (Dr.P.Gurtner)    TUBAL LIGATION      UPPER GASTROINTESTINAL ENDOSCOPY N/A 06/12/2022    ENTEROSCOPY PUSH DIAGNOSTIC w/small intestinal bipolar cautery of multiple AVMs performed by Sloan Leiter, MD at [facility] ENDOSCOPY     Social History:     Social History     Socioeconomic History    Marital status: Divorced     Spouse name: None    Number of children: None    Years of education: None    Highest education level: None   Tobacco Use    Smoking status: Former     Current packs/day: 0.00     Average packs/day: 1 pack/day for 27.0 years (27.0 ttl pk-yrs)     Types: Cigarettes     Start date: 10/16/1977     Quit date: 04/25/2004  Years since quitting: 18.6     Passive  exposure: Never    Smokeless tobacco: Never   Vaping Use    Vaping Use: Never used   Substance and Sexual Activity    Alcohol use: No    Drug use: Never    Sexual activity: Not Currently     Social Determinants of Health     Financial Resource Strain: Low Risk  (03/30/2022)    Overall Financial Resource Strain (CARDIA)     Difficulty of Paying Living Expenses: Not hard at all   Transportation Needs: Unknown (03/30/2022)    PRAPARE - Transportation     Lack of Transportation (Non-Medical): No   Physical Activity: Inactive (05/11/2022)    Exercise Vital Sign     Days of Exercise per Week: 0 days     Minutes of Exercise per Session: 0 min   Housing Stability: Unknown (03/30/2022)    Housing Stability Vital Sign     Unstable Housing in the Last Year: No       Recent use of: No recent use of aspirin (ASA), NSAIDS or steroids    Anesthesia Complications: None  History of abnormal bleeding : None      REVIEW OF SYSTEMS:  Respiratory: negative for dyspnea on exertion  Cardiovascular: negative for chest pain    EXAM:   BP (!) 167/70 (Site: Right Upper Arm, Position: Sitting, Cuff Size: Large Adult)   Pulse 83   Temp 97.8 F (36.6 C) (Temporal)   Resp 18   Ht 1.575 m ('5\' 2"'$ )   Wt 70.8 kg (156 lb)   SpO2 96%   BMI 28.53 kg/m   Chest - clear to auscultation, no wheezes, rales or rhonchi, symmetric air entry  Heart - normal rate, regular rhythm, normal S1, S2, no murmurs, rubs, clicks or gallops  Extremities - peripheral pulses normal, no pedal edema, no clubbing or cyanosis        Preop Labs reviewed     IMPRESSION:           ICD-10-CM    1. Essential (primary) hypertension  I10 Uncontrolled on Diovan 320 mg. Will add amLODIPine (NORVASC) 5 MG tablet and pt to monitor BP and call with readings next week. She is to take BP meds AM of surgery with sip of water.       2. High cholesterol  E78.00 Well-controlled Lipitor 40 mg daily      3. Iron deficiency anemia due to chronic blood loss  D50.0 Due to history of AVMs.  Patient  has not had an iron infusion recently but is stable for surgery.  May need transfusions perioperatively.      4. Postprocedural hypothyroidism  E89.0 Stable on Synthroid 100 mcg daily      5. Prediabetes  R73.03 Controlled currently with diet      6. Chronic diastolic congestive heart failure (HCC)  I50.32 Stable on current medications and followed by cardiology. Echo 12/02/22 shows EF%  55-60      7. Primary osteoarthritis of left hip  M16.12 Patient medically cleared for orthopedic surgery above she is at moderate risk.            Kemonte Ullman Vear Clock, MD   12/03/2022

## 2022-12-03 NOTE — Progress Notes (Signed)
Elaine [name] presents today for   Chief Complaint   Patient presents with    Pre-op Exam     LEFT TOTAL HIP ARTHROPLASTY  12/23/2022 at [facility]  Dr. Garner Nash                  1. "Have you been to the ER, urgent care clinic since your last visit?  Hospitalized since your last visit?" no    2. "Have you seen or consulted any other health care providers outside of the [facility] since your last visit?" Yes, Dr. Vonzell Schlatter.     3. For patients aged 6-75: Has the patient had a colonoscopy / FIT/ Cologuard? Yes - no Care Gap present      If the patient is female:    4. For patients aged 38-74: Has the patient had a mammogram within the past 2 years? Yes - no Care Gap present      5. For patients aged 21-65: Has the patient had a pap smear? NA - based on age or sex

## 2022-12-08 ENCOUNTER — Ambulatory Visit: Admit: 2022-12-08 | Discharge: 2022-12-08 | Payer: MEDICARE | Primary: Internal Medicine

## 2022-12-08 DIAGNOSIS — M1612 Unilateral primary osteoarthritis, left hip: Secondary | ICD-10-CM

## 2022-12-08 NOTE — Progress Notes (Signed)
Patient: Elaine [name]                MRN: [id number]       SSN: [id number]  Date of Birth: [birth year]        AGE: 72 y.o.        SEX: female    PCP: Thana Farr, MD  12/08/22    Chief Complaint: H&P (LEFT THA on 12/23/22)      1. Primary osteoarthritis of left hip  Assessment & Plan:  Planned surgery:LEFT THA, LATERAL approach  Surgery date: 12/23/22  Clearance obtained: yes, PCP (12/03/22), awaiting hematology clearance  Required imaging complete: yes, left hip and sit/ stand of spine (change in SS = 21.2)    ALL: aspirin (GI distress); diphenhydramine (msk spasms/ jerking)    Labs:   Hemoglobin: 9.7  Platelets: 355  Creatinine: 0.54  GFR >60  Albumin: 3.4  Hgb A1C: 5.7  Nicotine: NEG   UDS: NEG    PMH: HTN, Chronic diastolic HF, hx of GI AVMs/ Bleed, Hypothyroidism, Asthma, RIGHT renal atrophy, iron deficiency anemia, sleep apnea    MEDS: amlodipine, atorvastatin, synthroid, potassium chloride, valsartan, iron injections, montelukast, albuterol, ellipta.    Surgery was discussed with the patient today.  They have failed conservative management of their pathology. The risks and benefits of surgical and conservative (nonsurgical) treatment were discussed at length.  The risks of surgery include but are not limited to pain, scar, infection, painful hardware, hardware failure, fracture, instability, weakness, stiffness, Deep Veinous Thrombosis/Pulmonary Embolism, anesthetic risks including heart attack/stroke, injury to nerves and/or blood vessels, bleeding, the need for further surgery and death.  In the case of fracture repair, the risks also include nonunion or malunion. The recovery from surgery was also discussed at length.  All of the patient's questions were entertained and answered.  Understanding the diagnosis, risks and benefits of surgical treatment, the patient wishes to proceed with surgery.    Orders:  -     AMB POC X-RAY RADEX HIP UNI WITH PELVIS 2-3 VIEWS  -     AMB POC XRAY, SPINE,  LUMBOSACRAL; 2 O  2. Pre-diabetes  3. Gastrointestinal hemorrhage, unspecified gastrointestinal hemorrhage type  4. Chronic diastolic congestive heart failure ([place])  5. Hypothyroidism, unspecified type  6. Iron deficiency anemia, unspecified iron deficiency anemia type  7. HTN (hypertension), benign  8. Asthma, unspecified asthma severity, unspecified whether complicated, unspecified whether persistent    HPI:  Elaine [name] is a 72 y.o. female with chief complaint of   Chief Complaint   Patient presents with    Hip Pain     Lt      We have been planning on a left total hip arthroplasty through the anterolateral approach due to some pain in the lateral hip as well as a overhanging pannus.  She recently, in July, had a GI bleed and has had a cascade of medical issues since then.  She is hoping that she can have her hip replaced at the beginning of the year.        06/22/2022     1:33 PM   AMB PAIN ASSESSMENT   Location of Pain Hip   Location Modifiers Left   Severity of Pain 0   Quality of Pain Aching   Duration of Pain Persistent   Frequency of Pain Intermittent   Aggravating Factors Standing;Walking;Stairs   Limiting Behavior Yes   Relieving Factors Other (Comment)   Result of Injury No  Work-Related Injury No   Are there other pain locations you wish to document? No     Tobacco Use: Medium Risk (12/08/2022)    Patient History     Smoking Tobacco Use: Former     Smokeless Tobacco Use: Never     Passive Exposure: Never     IMAGING:  Imaging read by myself and interpreted as follows:    Dec 08, 2022:  3 view x-ray of the left hip including AP pelvis, and AP and lateral demonstrating severe joint space narrowing with evidence of osteophytosis, subchondral sclerosis, and subchondral cyst formation.     2 view x-ray of the lumbar spine, sitting and standing, demonstrates facet joint arthropathy and decreased disc space at multiple levels. The change in SS is 21.2.     September 25, 2022:  3 view x-ray of the left hip  including AP pelvis and AP and crosstable lateral of the left hip demonstrate significant joint space loss acetabular joint of approximately 90%.  There is osteophytosis,  subchondral sclerosis and subchondral cyst formation.      PHYSICAL EXAMINATION:  BP 132/60 (Site: Right Upper Arm, Position: Sitting)   Ht 1.575 m ('5\' 2"'$ )   Wt 68 kg (150 lb)   BMI 27.44 kg/m   Body mass index is 27.44 kg/m.  Wt Readings from Last 3 Encounters:   12/08/22 68 kg (150 lb)   12/03/22 70.8 kg (156 lb)   12/02/22 70.8 kg (156 lb)       GENERAL: Alert and oriented x3, in no acute distress.  HEENT: Normocephalic, atraumatic.    MSK: Left Hip Exam     Tenderness   The patient is experiencing tenderness in the lateral.    Range of Motion   Flexion:  100   External rotation:  30   Internal rotation: 10     Muscle Strength   Abduction: 5/5   Adduction: 5/5   Flexion: 5/5     Other   Erythema: absent  Scars: absent  Sensation: normal  Pulse: present    Comments:  Skin over the left hip is clean dry and intact.  There is a large overhanging pannus which overlies the incision site for a direct anterior approach.  Has difficulty maintaining single-leg stance on the left with positive Trendelenburg sign.         Assessment and plan:    December 08, 2022:  Primary osteoarthritis of the left hip. Pt is here for her H&P appointment prior to her 12/23/22 THA. 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 She has been cleared by her PCP for surgery and will be following up with her hematologist prior to her surgery for clearance due to her chronic iron deficiency anemia and low hemoglobin numbers on her last labs. They are out of the [facility] network, so they will be faxing that over once completed. She receives iron infusions every other month and had her last one in November.     She has allergies to aspirin and  diphenhydramine. Due to this she will get lovenox 30 mg as DVT prophylaxis. Due to her history of GI bleed, we will not prescribe meloxiam. I will order her post operative medications once she receives clearance from hematology.     September 25, 2022:  We will continue to plan on doing a left total hip arthroplasty through an anterolateral  approach.  Again addressed the benefits and risks of anterolateral versus direct anterior the patient would like to continue the anterior lateral plan.  She does need to improve her health and get stabilized with recent GI bleed.  We will hope to move forward after the first of the year.    Apr 20, 2022:  She failed conservative treatment with Tylenol, ibuprofen and gabapentin.  She did get a left hip corticosteroid injection intra-articularly which gave her only a month of greater than 90% relief of her pain.  At this point would like to go forward with a left total hip replacement through the anterolateral approach.  I did discuss with her the risks and benefits of a direct anterior versus anterolateral approach and she did agree to go forward with anterolateral approach.  She has an overhanging pannus that reaches down the thigh quite a bit.  She is currently working on adjusting her thyroid medications to get her hypothyroidism under control.  Her other medical comorbidities under medication control at this time.  She is currently working in childcare and would like to go forward with surgery in July as her last day of work is the end of June.      Past Medical History:   Diagnosis Date    Anemia     Asthma     Colon polyp     DDD (degenerative disc disease) 02/15/2009    Depression 12/18/2011    DVT (deep venous thrombosis) ([place]) 10/23/2009    Left leg:  No DVT.      Essential hypertension, benign     History of blood transfusion     History of echocardiogram 03/20/2004    EF 70%.  Borderline DDfx.  No significant valvular pathology.    Hypercholesterolemia     Hypertension      Iron deficiency anemia 8/56/3149    Lichen planus 06/07/6377    Meralgia paraesthetica 10/22/2013    OA (osteoarthritis of spine) 02/15/2009    OA (osteoarthritis of spine) 02/15/2009    Obesity, unspecified     Other and unspecified hyperlipidemia     Pre-operative cardiovascular examination     For spine surgery    Right sided sciatica     Shortness of breath     Possible asthma, HCVD; less likely CAD (Noted 03/15/09)    Sleep apnea 02/15/2009    uses cpap machine    Thallium stress test abnormal 03/20/2004    Partially transient, mod basal & mid anterior defect most c/w artifact; mild anterior ischemia less likely.  Neg EKG on pharm stress test.    Thyroid disease     hypothyroidism       Family History   Problem Relation Age of Onset    Hypertension Sister         x3    Hypertension Mother     Cancer Mother     Heart Surgery Sister     Heart Disease Sister     Hypertension Paternal Uncle     Cancer Maternal Aunt         breast    Breast Cancer Maternal Aunt     Glaucoma Son     Heart Defect Son     Diabetes Paternal Uncle     Heart Attack Sister        Current Outpatient Medications   Medication Sig Dispense Refill    amLODIPine (NORVASC) 5 MG tablet Take 1 tablet by mouth daily 30 tablet 3  Misc Natural Products (NEURIVA PO) Take 1 tablet by mouth daily      NONFORMULARY Viviscal -1 tablet twice a day for hair and scalp      gabapentin (NEURONTIN) 300 MG capsule Take 1 capsule by mouth 3 times daily for 180 days. Max Daily Amount: 900 mg 270 capsule 1    potassium chloride (KLOR-CON M) 10 MEQ extended release tablet TAKE 1 TABLET EVERY DAY 90 tablet 3    valsartan (DIOVAN) 320 MG tablet Take 1 tablet by mouth daily 90 tablet 3    levothyroxine (SYNTHROID) 100 MCG tablet Take 1 tablet by mouth daily 90 tablet 1    atorvastatin (LIPITOR) 40 MG tablet TAKE 1 TABLET EVERY DAY (NEED MD APPOINTMENT) 90 tablet 3    montelukast (SINGULAIR) 10 MG tablet       albuterol sulfate HFA (PROVENTIL;VENTOLIN;PROAIR) 108 (90 Base)  MCG/ACT inhaler Inhale 2 puffs into the lungs every 4 hours as needed for Wheezing or Shortness of Breath 18 g 5    cyanocobalamin 1000 MCG tablet Take 2.5 tablets by mouth daily      ZINC PO Take 1 tablet by mouth daily      vitamin D3 (CHOLECALCIFEROL) 125 MCG (5000 UT) TABS tablet Take 1 tablet by mouth daily      iron dextran complex (INFED) 50 MG/ML injection Infuse 19.5 mLs intravenously Pt gets these every other month      latanoprost (XALATAN) 0.005 % ophthalmic solution Place 1 drop into both eyes nightly      lidocaine (LIDODERM) 5 % Place 1 patch onto the skin every 24 hours      pantoprazole (PROTONIX) 40 MG tablet Take 1 tablet by mouth daily       No current facility-administered medications for this visit.        Allergies   Allergen Reactions    Aspirin Other (See Comments)     GI distress,     Diphenhydramine Other (See Comments)     Muscle jerking    Pollen Extract      Other reaction(s): Not Reported This Time       Past Surgical History:   Procedure Laterality Date    COLONOSCOPY N/A 04/25/2020    SCREENING COLONOSCOPY performed by Sloan Leiter, MD at [facility] ENDOSCOPY    COLONOSCOPY  02-24-12    normal (Dr. Angelique Holm)    COLONOSCOPY FLX DX W/COLLJ [facility] WHEN PFRMD  02-2007    +polyp(tubular adenoma); Dr Angelique Holm    HERNIA REPAIR      3x    HYSTERECTOMY ([place])      NEUROLOGICAL SURGERY  03-16-2009    s/p ACD & Fusion (Dr.P.Gurtner)    TUBAL LIGATION      UPPER GASTROINTESTINAL ENDOSCOPY N/A 06/12/2022    ENTEROSCOPY PUSH DIAGNOSTIC w/small intestinal bipolar cautery of multiple AVMs performed by Sloan Leiter, MD at [facility] ENDOSCOPY       Social History     Socioeconomic History    Marital status: Divorced     Spouse name: Not on file    Number of children: Not on file    Years of education: Not on file    Highest education level: Not on file   Occupational History    Not on file   Tobacco Use    Smoking status: Former     Current packs/day: 0.00     Average packs/day: 1 pack/day for 27.0 years  (27.0 ttl pk-yrs)  Types: Cigarettes     Start date: 10/16/1977     Quit date: 04/25/2004     Years since quitting: 18.6     Passive exposure: Never    Smokeless tobacco: Never   Vaping Use    Vaping Use: Never used   Substance and Sexual Activity    Alcohol use: No    Drug use: Never    Sexual activity: Not Currently   Other Topics Concern    Not on file   Social History Narrative    Not on file     Social Determinants of Health     Financial Resource Strain: Low Risk  (03/30/2022)    Overall Financial Resource Strain (CARDIA)     Difficulty of Paying Living Expenses: Not hard at all   Food Insecurity: Not on file (03/30/2022)   Transportation Needs: Unknown (03/30/2022)    PRAPARE - Armed forces logistics/support/administrative officer (Medical): Not on file     Lack of Transportation (Non-Medical): No   Physical Activity: Inactive (05/11/2022)    Exercise Vital Sign     Days of Exercise per Week: 0 days     Minutes of Exercise per Session: 0 min   Stress: Not on file   Social Connections: Not on file   Intimate Partner Violence: Not on file   Housing Stability: Unknown (03/30/2022)    Housing Stability Vital Sign     Unable to Pay for Housing in the Last Year: Not on file     Number of Places Lived in the Last Year: Not on file     Unstable Housing in the Last Year: No       REVIEW OF SYSTEMS:      No changes from previous review of systems unless noted.      Prescription medication management discussed with patient.     Electronically signed by: Antoine Poche, PA-C    Note: This note was completed using voice recognition software.  Any typographical/name errors or mistakes are unintentional.

## 2022-12-08 NOTE — Assessment & Plan Note (Addendum)
Planned surgery:LEFT THA, LATERAL approach  Surgery date: 12/23/22  Clearance obtained: yes, PCP (12/03/22), awaiting hematology clearance  Required imaging complete: yes, left hip and sit/ stand of spine (change in SS = 21.2)    ALL: aspirin (GI distress); diphenhydramine (msk spasms/ jerking)    Labs:   Hemoglobin: 9.7  Platelets: 355  Creatinine: 0.54  GFR >60  Albumin: 3.4  Hgb A1C: 5.7  Nicotine: NEG   UDS: NEG    PMH: HTN, Chronic diastolic HF, hx of GI AVMs/ Bleed, Hypothyroidism, Asthma, RIGHT renal atrophy, iron deficiency anemia, sleep apnea    MEDS: amlodipine, atorvastatin, synthroid, potassium chloride, valsartan, iron injections, montelukast, albuterol, ellipta.    Surgery was discussed with the patient today.  They have failed conservative management of their pathology. The risks and benefits of surgical and conservative (nonsurgical) treatment were discussed at length.  The risks of surgery include but are not limited to pain, scar, infection, painful hardware, hardware failure, fracture, instability, weakness, stiffness, Deep Veinous Thrombosis/Pulmonary Embolism, anesthetic risks including heart attack/stroke, injury to nerves and/or blood vessels, bleeding, the need for further surgery and death.  In the case of fracture repair, the risks also include nonunion or malunion. The recovery from surgery was also discussed at length.  All of the patient's questions were entertained and answered.  Understanding the diagnosis, risks and benefits of surgical treatment, the patient wishes to proceed with surgery.

## 2022-12-16 ENCOUNTER — Encounter

## 2022-12-16 ENCOUNTER — Ambulatory Visit: Admit: 2022-12-16 | Discharge: 2022-12-16 | Payer: MEDICARE | Attending: Internal Medicine | Primary: Internal Medicine

## 2022-12-16 DIAGNOSIS — Z0000 Encounter for general adult medical examination without abnormal findings: Secondary | ICD-10-CM

## 2022-12-16 NOTE — Progress Notes (Signed)
Medicare Annual Wellness Visit    Elaine [name] is here for Hypertension (Follow up) and Medicare AWV    Assessment & Plan     ICD-10-CM    1. Medicare annual wellness visit, subsequent  Z00.00       2. Essential hypertension  I10 Better controlled at home with Norvasc 5 mg daily added to Diovan 320 mg daily.  Patient does have some whitecoat hypertension so she was instructed if her systolic blood pressure at home is > 140 to increase the Norvasc to 10 mg daily.  Patient remains cleared for upcoming orthopedic surgery.         Recommendations for Preventive Services Due: see orders and patient instructions/AVS.  Recommended screening schedule for the next 5-10 years is provided to the patient in written form: see Patient Instructions/AVS.     Return in about 2 months (around 02/14/2023) for labs 1 week before.     Subjective   The following acute and/or chronic problems were also addressed today:  Patient's high blood pressure has been better controlled at home with systolic blood pressure in the 130s.  She recently had Norvasc 5 mg added to her Diovan 320 mg.  Patient has some whitecoat hypertension so she will continue to monitor her blood pressure at home and if systolic blood pressure is > 140 she can increase Norvasc to 10 mg daily.    Patient's complete Health Risk Assessment and screening values have been reviewed and are found in Flowsheets. The following problems were reviewed today and where indicated follow up appointments were made and/or referrals ordered.    Positive Risk Factor Screenings with Interventions:                Activity, Diet, and Weight:  On average, how many days per week do you engage in moderate to strenuous exercise (like a brisk walk)?: 0 days  On average, how many minutes do you engage in exercise at this level?: 0 min    Do you eat balanced/healthy meals regularly?: (!) No    Body mass index is 27.62 kg/m.      Inactivity Interventions:  Once patient has had a hip replaced she  will consider increasing activity starting with physical therapy.  Do you eat balanced/healthy meals regularly Interventions:  Patient declines any further evaluation or treatment        Dentist Screen:  Have you seen the dentist within the past year?: (!) No    Intervention:  Advised to schedule with their dentist      Safety:  Do you have non-slip mats or non-slip surfaces or shower bars or grab bars in your shower or bathtub?: (!) No  Interventions:  Patient declined any further interventions or treatment     Advanced Directives:  Do you have a Living Will?: (!) No    Intervention:  has NO advanced directive - not interested in additional information                     Objective   Vitals:    12/16/22 0811 12/16/22 0819   BP: (!) 145/81 (!) 149/76   Site: Right Upper Arm Left Upper Arm   Position: Sitting Sitting   Cuff Size: Small Adult Small Adult   Pulse: 77 77   Resp: 20    Temp: 97.7 F (36.5 C)    TempSrc: Oral    SpO2: 99%    Weight: 68.5 kg (151 lb)  Height: 1.575 m ('5\' 2"'$ )       Body mass index is 27.62 kg/m.               Allergies   Allergen Reactions    Aspirin Other (See Comments)     GI distress,     Diphenhydramine Other (See Comments)     Muscle jerking    Pollen Extract      Other reaction(s): Not Reported This Time     Prior to Visit Medications    Medication Sig Taking? Authorizing Provider   amLODIPine (NORVASC) 5 MG tablet Take 1 tablet by mouth daily Yes Janila Arrazola, Ninfa Meeker, MD   Misc Natural Products (NEURIVA PO) Take 1 tablet by mouth daily Yes [provider]   NONFORMULARY Viviscal -1 tablet twice a day for hair and scalp Yes [provider]   gabapentin (NEURONTIN) 300 MG capsule Take 1 capsule by mouth 3 times daily for 180 days. Max Daily Amount: 900 mg Yes Royale Lennartz, Ninfa Meeker, MD   potassium chloride (KLOR-CON M) 10 MEQ extended release tablet TAKE 1 TABLET EVERY DAY Yes Carlyle Dolly, MD   levothyroxine (SYNTHROID) 100 MCG tablet Take 1 tablet by mouth daily Yes Eryc Bodey,  Ninfa Meeker, MD   atorvastatin (LIPITOR) 40 MG tablet TAKE 1 TABLET EVERY DAY (NEED MD APPOINTMENT) Yes Ikeem Cleckler, Ninfa Meeker, MD   montelukast (SINGULAIR) 10 MG tablet  Yes [provider]   albuterol sulfate HFA (PROVENTIL;VENTOLIN;PROAIR) 108 (90 Base) MCG/ACT inhaler Inhale 2 puffs into the lungs every 4 hours as needed for Wheezing or Shortness of Breath Yes Aylen Stradford, Ninfa Meeker, MD   cyanocobalamin 1000 MCG tablet Take 2.5 tablets by mouth daily Yes [provider]   ZINC PO Take 1 tablet by mouth daily Yes Automatic Reconciliation, Ar   vitamin D3 (CHOLECALCIFEROL) 125 MCG (5000 UT) TABS tablet Take 1 tablet by mouth daily Yes Automatic Reconciliation, Ar   iron dextran complex (INFED) 50 MG/ML injection Infuse 19.5 mLs intravenously Pt gets these every other month Yes Automatic Reconciliation, Ar   latanoprost (XALATAN) 0.005 % ophthalmic solution Place 1 drop into both eyes nightly Yes Automatic Reconciliation, Ar   lidocaine (LIDODERM) 5 % Place 1 patch onto the skin every 24 hours Yes Automatic Reconciliation, Ar   pantoprazole (PROTONIX) 40 MG tablet Take 1 tablet by mouth daily Yes Automatic Reconciliation, Ar   valsartan (DIOVAN) 320 MG tablet Take 1 tablet by mouth daily  Patient not taking: Reported on 12/16/2022  Thana Farr, MD       CareTeam (Including outside providers/suppliers regularly involved in providing care):   Patient Care Team:  Thana Farr, MD as PCP - General  Madelyn Brunner Ninfa Meeker, MD as PCP - Empaneled Provider     Reviewed and updated this visit:  Tobacco  Allergies  Meds  Problems  Med Hx  Surg Hx  Soc Hx  Fam Hx

## 2022-12-16 NOTE — Progress Notes (Signed)
Patient dropped off Short Term Disability Form to be completed at hs office and can be reached at [phone] when ready for pickup .

## 2022-12-16 NOTE — Progress Notes (Signed)
Elaine [name] presents today for   Chief Complaint   Patient presents with    Hypertension     Follow up    Medicare AWV         1. "Have you been to the ER, urgent care clinic since your last visit?  Hospitalized since your last visit?" no    2. "Have you seen or consulted any other health care providers outside of the [facility] since your last visit?" no     3. For patients aged 73-75: Has the patient had a colonoscopy / FIT/ Cologuard? Yes - no Care Gap present      If the patient is female:    4. For patients aged 63-74: Has the patient had a mammogram within the past 2 years? Yes - no Care Gap present      5. For patients aged 21-65: Has the patient had a pap smear? NA - based on age or sex

## 2022-12-16 NOTE — Patient Instructions (Signed)
Learning About Being Active as an Older Adult  Why is being active important as you get older?     Being active is one of the best things you can do for your health. And it's never too late to start. Being active--or getting active, if you aren't already--has definite benefits. It can:  Give you more energy,  Keep your mind sharp.  Improve balance to reduce your risk of falls.  Help you manage chronic illness with fewer medicines.  No matter how old you are, how fit you are, or what health problems you have, there is a form of activity that will work for you. And the more physical activity you can do, the better your overall health will be.  What kinds of activity can help you stay healthy?  Being more active will make your daily activities easier. Physical activity includes planned exercise and things you do in daily life. There are four types of activity:  Aerobic.  Doing aerobic activity makes your heart and lungs strong.  Includes walking, dancing, and gardening.  Aim for at least 2 hours spread throughout the week.  It improves your energy and can help you sleep better.  Muscle-strengthening.  This type of activity can help maintain muscle and strengthen bones.  Includes climbing stairs, using resistance bands, and lifting or carrying heavy loads.  Aim for at least twice a week.  It can help protect the knees and other joints.  Stretching.  Stretching gives you better range of motion in joints and muscles.  Includes upper arm stretches, calf stretches, and gentle yoga.  Aim for at least twice a week, preferably after your muscles are warmed up from other activities.  It can help you function better in daily life.  Balancing.  This helps you stay coordinated and have good posture.  Includes heel-to-toe walking, tai chi, and certain types of yoga.  Aim for at least 3 days a week.  It can reduce your risk of falling.  Even if you have a hard time meeting the recommendations, it's better to be more active  than less active. All activity done in each category counts toward your weekly total. You'd be surprised how daily things like carrying groceries, keeping up with grandchildren, and taking the stairs can add up.  What keeps you from being active?  If you've had a hard time being more active, you're not alone. Maybe you remember being able to do more. Or maybe you've never thought of yourself as being active. It's frustrating when you can't do the things you want. Being more active can help. What's holding you back?  Getting started.  Have a goal, but break it into easy tasks. Small steps build into big accomplishments.  Staying motivated.  If you feel like skipping your activity, remember your goal. Maybe you want to move better and stay independent. Every activity gets you one step closer.  Not feeling your best.  Start with 5 minutes of an activity you enjoy. Prove to yourself you can do it. As you get comfortable, increase your time.  You may not be where you want to be. But you're in the process of getting there. Everyone starts somewhere.  How can you find safe ways to stay active?  Talk with your doctor about any physical challenges you're facing. Make a plan with your doctor if you have a health problem or aren't sure how to get started with activity.  If you're already active, ask your doctor if  there is anything you should change to stay safe as your body and health change.  If you tend to feel dizzy after you take medicine, avoid activity at that time. Try being active before you take your medicine. This will reduce your risk of falls.  If you plan to be active at home, make sure to clear your space before you get started. Remove things like TV cords, coffee tables, and throw rugs. It's safest to have plenty of space to move freely.  The key to getting more active is to take it slow and steady. Try to improve only a little bit at a time. Pick just one area to improve on at first. And if an activity hurts,  stop and talk to your doctor.  Where can you learn more?  Go to https://www.bennett.info/ and enter P600 to learn more about "Learning About Being Active as an Older Adult."  Current as of: June 6, 2023Content Version: 13.9   2006-2023 Healthwise, Incorporated.   Care instructions adapted under license by [facility]. If you have questions about a medical condition or this instruction, always ask your healthcare professional. [place] any warranty or liability for your use of this information.           Learning About Dental Care for Older Adults  Dental care for older adults: Overview  Dental care for older people is much the same as for younger adults. But older adults do have concerns that younger adults do not. Older adults may have problems with gum disease and decay on the roots of their teeth. They may need missing teeth replaced or broken fillings fixed. Or they may have dentures that need to be cared for. Some older adults may have trouble holding a toothbrush.  You can help remind the person you are caring for to brush and floss their teeth or to clean their dentures. In some cases, you may need to do the brushing and other dental care tasks. People who have trouble using their hands or who have dementia may need this extra help.  How can you help with dental care?  Normal dental care  To keep the teeth and gums healthy:  Brush the teeth with fluoride toothpaste twice a day--in the morning and at night--and floss at least once a day. Plaque can quickly build up on the teeth of older adults.  Watch for the signs of gum disease. These signs include gums that bleed after brushing or after eating hard foods, such as apples.  See a dentist regularly. Many experts recommend checkups every 6 months.  Keep the dentist up to date on any new medications the person is taking.  Encourage a balanced diet that includes whole grains, vegetables, and fruits, and that  is low in saturated fat and sodium.  Encourage the person you're caring for not to use tobacco products. They can affect dental and general health.  Many older adults have a fixed income and feel that they can't afford dental care. But most towns and cities have programs in which dentists help older adults by lowering fees. Contact your area's public health offices or social services for information about dental care in your area.  Using a toothbrush  Older adults with arthritis sometimes have trouble brushing their teeth because they can't easily hold the toothbrush. Their hands and fingers may be stiff, painful, or weak. If this is the case, you can:  Offer an IT trainer toothbrush.  Enlarge the handle of a non-electric  toothbrush by wrapping a sponge, an elastic bandage, or adhesive tape around it.  Push the toothbrush handle through a ball made of rubber or soft foam.  Make the handle longer and thicker by taping Popsicle sticks or tongue depressors to it.  You may also be able to buy special toothbrushes, toothpaste dispensers, and floss holders.  Your doctor may recommend a soft-bristle toothbrush if the person you care for bleeds easily. Bleeding can happen because of a health problem or from certain medicines.  A toothpaste for sensitive teeth may help if the person you care for has sensitive teeth.  How do you brush and floss someone's teeth?  If the person you are caring for has a hard time cleaning their teeth on their own, you may need to brush and floss their teeth for them. It may be easiest to have the person sit and face away from you, and to sit or stand behind them. That way you can steady their head against your arm as you reach around to floss and brush their teeth. Choose a place that has good lighting and is comfortable for both of you.  Before you begin, gather your supplies. You will need gloves, floss, a toothbrush, and a container to hold water if you are not near a sink. Wash and dry your  hands well and put on gloves. Start by flossing:  Gently work a piece of floss between each of the teeth toward the gums. A plastic flossing tool may make this easier, and they are available at most drugstores.  Curve the floss around each tooth into a U-shape and gently slide it under the gum line.  Move the floss firmly up and down several times to scrape off the plaque.  After you've finished flossing, throw away the used floss and begin brushing:  Wet the brush and apply toothpaste.  Place the brush at a 45-degree angle where the teeth meet the gums. Press firmly, and move the brush in small circles over the surface of the teeth.  Be careful not to brush too hard. Vigorous brushing can make the gums pull away from the teeth and can scratch the tooth enamel.  Brush all surfaces of the teeth, on the tongue side and on the cheek side. Pay special attention to the front teeth and all surfaces of the back teeth.  Brush chewing surfaces with short back-and-forth strokes.  After you've finished, help the person rinse the remaining toothpaste from their mouth.  Where can you learn more?  Go to https://www.bennett.info/ and enter F944 to learn more about "[place] for Older Adults."  Current as of: August 6, 2023Content Version: 13.9   2006-2023 Healthwise, Incorporated.   Care instructions adapted under license by [facility]. If you have questions about a medical condition or this instruction, always ask your healthcare professional. [place] any warranty or liability for your use of this information.           Eating Healthy Foods: Care Instructions  With every meal, you can make healthy food choices. Try to eat a variety of fruits, vegetables, whole grains, lean proteins, and low-fat dairy products. This can help you get the right balance of nutrients, including vitamins and minerals. Small changes add up over time. You can start by adding one  healthy food to your meals each day.    Try to make half your plate fruits and vegetables, one-fourth whole grains, and one-fourth lean proteins. Try including dairy  with your meals.   Eat more fruits and vegetables. Try to have them with most meals and snacks.   Foods for healthy eating    Fruits    These can be fresh, frozen, canned, or dried.  Try to choose whole fruit rather than fruit juice.  Eat a variety of colors.    Vegetables    These can be fresh, frozen, canned, or dried.  Beans, peas, and lentils count too.    Whole grains    Choose whole-grain breads, cereals, and noodles.  Try brown rice.    Lean proteins    These can include lean meat, poultry, fish, and eggs.  You can also have tofu, beans, peas, lentils, nuts, and seeds.    Dairy    Try milk, yogurt, and cheese.  Choose low-fat or fat-free when you can.  If you need to, use lactose-free milk or fortified plant-based milk products, such as soy milk.    Water    Drink water when you're thirsty.  Limit sugar-sweetened drinks, including soda, fruit drinks, and sports drinks.  Where can you learn more?  Go to https://www.bennett.info/ and enter T756 to learn more about "Eating Healthy Foods: Care Instructions."  Current as of: September 20, 2023Content Version: 13.9   2006-2023 Healthwise, Incorporated.   Care instructions adapted under license by [facility]. If you have questions about a medical condition or this instruction, always ask your healthcare professional. [place] any warranty or liability for your use of this information.           Advance Directives: Care Instructions  Overview  An advance directive is a legal way to state your wishes at the end of your life. It tells your family and your doctor what to do if you can't say what you want.  There are two main types of advance directives. You can change them any time your wishes change.  Living will.  This form tells your family and your  doctor your wishes about life support and other treatment. The form is also called a declaration.  Medical power of attorney.  This form lets you name a person to make treatment decisions for you when you can't speak for yourself. This person is called a health care agent (health care proxy, health care surrogate). The form is also called a durable power of attorney for health care.  If you do not have an advance directive, decisions about your medical care may be made by a family member, or by a doctor or a judge who doesn't know you.  It may help to think of an advance directive as a gift to the people who care for you. If you have one, they won't have to make tough decisions by themselves.  For more information, including forms for your state, see the [place] website (RebankingSpace.hu).  Follow-up care is a key part of your treatment and safety. Be sure to make and go to all appointments, and call your doctor if you are having problems. It's also a good idea to know your test results and keep a list of the medicines you take.  What should you include in an advance directive?  Many states have a unique advance directive form. (It may ask you to address specific issues.) Or you might use a universal form that's approved by many states.  If your form doesn't tell you what to address, it may be hard to know what to include in your advance directive. Use the  questions below to help you get started.  Who do you want to make decisions about your medical care if you are not able to?  What life-support measures do you want if you have a serious illness that gets worse over time or can't be cured?  What are you most afraid of that might happen? (Maybe you're afraid of having pain, losing your independence, or being kept alive by machines.)  Where would you prefer to die? (Your home? A hospital? A nursing home?)  Do you want to donate your organs when you die?  Do you want certain religious  practices performed before you die?  When should you call for help?  Be sure to contact your doctor if you have any questions.  Where can you learn more?  Go to https://www.bennett.info/ and enter R264 to learn more about "Advance Directives: Care Instructions."  Current as of: March 26, 2023Content Version: 13.9   2006-2023 Healthwise, Incorporated.   Care instructions adapted under license by [facility]. If you have questions about a medical condition or this instruction, always ask your healthcare professional. [place] any warranty or liability for your use of this information.           A Healthy Heart: Care Instructions  Your Care Instructions     Coronary artery disease, also called heart disease, occurs when a substance called plaque builds up in the vessels that supply oxygen-rich blood to your heart muscle. This can narrow the blood vessels and reduce blood flow. A heart attack happens when blood flow is completely blocked. A high-fat diet, smoking, and other factors increase the risk of heart disease.  Your doctor has found that you have a chance of having heart disease. You can do lots of things to keep your heart healthy. It may not be easy, but you can change your diet, exercise more, and quit smoking. These steps really work to lower your chance of heart disease.  Follow-up care is a key part of your treatment and safety. Be sure to make and go to all appointments, and call your doctor if you are having problems. It's also a good idea to know your test results and keep a list of the medicines you take.  How can you care for yourself at home?  Diet   Use less salt when you cook and eat. This helps lower your blood pressure. Taste food before salting. Add only a little salt when you think you need it. With time, your taste buds will adjust to less salt.    Eat fewer snack items, fast foods, canned soups, and other high-salt, high-fat, processed  foods.    Read food labels and try to avoid saturated and trans fats. They increase your risk of heart disease by raising cholesterol levels.    Limit the amount of solid fat-butter, margarine, and shortening-you eat. Use olive, peanut, or canola oil when you cook. Bake, broil, and steam foods instead of frying them.    Eat a variety of fruit and vegetables every day. Dark green, deep orange, red, or yellow fruits and vegetables are especially good for you. Examples include spinach, carrots, peaches, and berries.    Foods high in fiber can reduce your cholesterol and provide important vitamins and minerals. High-fiber foods include whole-grain cereals and breads, oatmeal, beans, brown rice, citrus fruits, and apples.    Eat lean proteins. Heart-healthy proteins include seafood, lean meats and poultry, eggs, beans, peas, nuts, seeds, and soy products.  Limit drinks and foods with added sugar. These include candy, desserts, and soda pop.   Lifestyle changes   If your doctor recommends it, get more exercise. Walking is a good choice. Bit by bit, increase the amount you walk every day. Try for at least 30 minutes on most days of the week. You also may want to swim, bike, or do other activities.    Do not smoke. If you need help quitting, talk to your doctor about stop-smoking programs and medicines. These can increase your chances of quitting for good. Quitting smoking may be the most important step you can take to protect your heart. It is never too late to quit.    Limit alcohol to 2 drinks a day for men and 1 drink a day for women. Too much alcohol can cause health problems.    Manage other health problems such as diabetes, high blood pressure, and high cholesterol. If you think you may have a problem with alcohol or drug use, talk to your doctor.   Medicines   Take your medicines exactly as prescribed. Call your doctor if you think you are having a problem with your medicine.    If your doctor  recommends aspirin, take the amount directed each day. Make sure you take aspirin and not another kind of pain reliever, such as acetaminophen (Tylenol).   When should you call for help?   Call 911 if you have symptoms of a heart attack. These may include:   Chest pain or pressure, or a strange feeling in the chest.    Sweating.    Shortness of breath.    Pain, pressure, or a strange feeling in the back, neck, jaw, or upper belly or in one or both shoulders or arms.    Lightheadedness or sudden weakness.    A fast or irregular heartbeat.   After you call 911, the operator may tell you to chew 1 adult-strength or 2 to 4 low-dose aspirin. Wait for an ambulance. Do not try to drive yourself.  Watch closely for changes in your health, and be sure to contact your doctor if you have any problems.  Where can you learn more?  Go to https://www.bennett.info/ and enter F075 to learn more about "A Healthy Heart: Care Instructions."  Current as of: June 25, 2023Content Version: 13.9   2006-2023 Healthwise, Incorporated.   Care instructions adapted under license by [facility]. If you have questions about a medical condition or this instruction, always ask your healthcare professional. [place] any warranty or liability for your use of this information.      Personalized Preventive Plan for JI [facility] - 12/16/2022  Medicare offers a range of preventive health benefits. Some of the tests and screenings are paid in full while other may be subject to a deductible, co-insurance, and/or copay.    Some of these benefits include a comprehensive review of your medical history including lifestyle, illnesses that may run in your family, and various assessments and screenings as appropriate.    After reviewing your medical record and screening and assessments performed today your provider may have ordered immunizations, labs, imaging, and/or referrals for you.  A list of  these orders (if applicable) as well as your Preventive Care list are included within your After Visit Summary for your review.    Other Preventive Recommendations:    A preventive eye exam performed by an eye specialist is recommended every 1-2 years to screen for glaucoma; cataracts, macular  degeneration, and other eye disorders.  A preventive dental visit is recommended every 6 months.  Try to get at least 150 minutes of exercise per week or 10,000 steps per day on a pedometer .  Order or download the FREE "Exercise & Physical Activity: Your Everyday Guide" from [employer]. Call 218-147-3588 or search [employer] online.  You need 1200-1500 mg of calcium and 1000-2000 IU of vitamin D per day. It is possible to meet your calcium requirement with diet alone, but a vitamin D supplement is usually necessary to meet this goal.  When exposed to the sun, use a sunscreen that protects against both UVA and UVB radiation with an SPF of 30 or greater. Reapply every 2 to 3 hours or after sweating, drying off with a towel, or swimming.  Always wear a seat belt when traveling in a car. Always wear a helmet when riding a bicycle or motorcycle.

## 2022-12-17 NOTE — Telephone Encounter (Signed)
Left message for patient from is ready for pick up at the front office.

## 2022-12-22 ENCOUNTER — Encounter: Payer: MEDICARE | Attending: Internal Medicine | Primary: Internal Medicine

## 2022-12-30 ENCOUNTER — Encounter

## 2022-12-30 MED ORDER — AMLODIPINE BESYLATE 5 MG PO TABS
5 | ORAL_TABLET | Freq: Every day | ORAL | 3 refills | Status: DC
Start: 2022-12-30 — End: 2024-02-07

## 2022-12-30 NOTE — Telephone Encounter (Signed)
Last OV: 12/16/2202  Next OV: 03/01/2023  Last refill: 12/03/2022    Per family pharmacy stated the medication was cancelled.

## 2022-12-30 NOTE — Telephone Encounter (Signed)
Patients daughter went to pick up meds. And was told to contact dr. Madelyn Brunner due to him cancelling it himself. Daughter states this med was perfect for her blood pressure. Please advise      CB: [phone]

## 2022-12-31 ENCOUNTER — Ambulatory Visit: Payer: MEDICARE | Attending: Specialist | Primary: Internal Medicine

## 2023-01-01 ENCOUNTER — Ambulatory Visit: Payer: MEDICARE | Attending: Internal Medicine | Primary: Internal Medicine

## 2023-01-04 ENCOUNTER — Ambulatory Visit: Admit: 2023-01-04 | Discharge: 2023-01-04 | Payer: MEDICARE | Attending: Internal Medicine | Primary: Internal Medicine

## 2023-01-04 DIAGNOSIS — I10 Essential (primary) hypertension: Secondary | ICD-10-CM

## 2023-01-04 NOTE — Progress Notes (Signed)
Elaine [name] presents today for   Chief Complaint   Patient presents with    Blood Pressure Check                 1. "Have you been to the ER, urgent care clinic since your last visit?  Hospitalized since your last visit?" No    2. "Have you seen or consulted any other health care providers outside of the [facility] since your last visit?" Maybe not sure     3. For patients aged 73-75: Has the patient had a colonoscopy / FIT/ Cologuard? No      If the patient is female:    4. For patients aged 66-74: Has the patient had a mammogram within the past 2 years? No      5. For patients aged 21-65: Has the patient had a pap smear? NA - based on age or sex

## 2023-01-04 NOTE — Progress Notes (Signed)
Elaine [name] (DOB:  October 16, 1951) is a 72 y.o. female established patient, here for evaluation of the following chief complaint(s):  Blood Pressure Check         ASSESSMENT/PLAN:    ICD-10-CM    1. Essential (primary) hypertension  I10 Well controlled on Norvasc 5 mg daily              Return if symptoms worsen or fail to improve.         Subjective   SUBJECTIVE/OBJECTIVE:  Patient's high blood pressure is well-controlled on Norvasc 5 mg daily.  Patient though  misunderstanding had stop the Diovan 320 mg about 2 weeks ago when I added Norvasc 5 mg daily.  Her blood pressure is currently controlled on Norvasc 5 mg daily but would only restart the Diovan if her blood pressure at home >130/80 on a regular basis for more than a week.  Patient will continue to monitor blood pressure daily and keep a record.           Objective   BP 126/60 (Site: Left Upper Arm, Position: Sitting, Cuff Size: Medium Adult)   Pulse 65   Temp 98 F (36.7 C) (Temporal)   Resp 16   Ht 1.575 m ('5\' 2"'$ )   Wt 67.9 kg (149 lb 9.6 oz)   SpO2 97%   BMI 27.36 kg/m          Current Outpatient Medications   Medication Sig    amLODIPine (NORVASC) 5 MG tablet Take 1 tablet by mouth daily    Misc Natural Products (NEURIVA PO) Take 1 tablet by mouth daily    NONFORMULARY Viviscal -1 tablet twice a day for hair and scalp    gabapentin (NEURONTIN) 300 MG capsule Take 1 capsule by mouth 3 times daily for 180 days. Max Daily Amount: 900 mg    potassium chloride (KLOR-CON M) 10 MEQ extended release tablet TAKE 1 TABLET EVERY DAY    levothyroxine (SYNTHROID) 100 MCG tablet Take 1 tablet by mouth daily    atorvastatin (LIPITOR) 40 MG tablet TAKE 1 TABLET EVERY DAY (NEED MD APPOINTMENT)    cyanocobalamin 1000 MCG tablet Take 2.5 tablets by mouth daily    ZINC PO Take 1 tablet by mouth daily    vitamin D3 (CHOLECALCIFEROL) 125 MCG (5000 UT) TABS tablet Take 1 tablet by mouth daily    iron dextran complex (INFED) 50 MG/ML injection Infuse 19.5 mLs  intravenously Pt gets these every other month    latanoprost (XALATAN) 0.005 % ophthalmic solution Place 1 drop into both eyes nightly    lidocaine (LIDODERM) 5 % Place 1 patch onto the skin every 24 hours    montelukast (SINGULAIR) 10 MG tablet  (Patient not taking: Reported on 01/04/2023)    albuterol sulfate HFA (PROVENTIL;VENTOLIN;PROAIR) 108 (90 Base) MCG/ACT inhaler Inhale 2 puffs into the lungs every 4 hours as needed for Wheezing or Shortness of Breath (Patient not taking: Reported on 01/04/2023)    pantoprazole (PROTONIX) 40 MG tablet Take 1 tablet by mouth daily (Patient not taking: Reported on 01/04/2023)     No current facility-administered medications for this visit.       CMP:    Lab Results   Component Value Date/Time    NA 142 12/02/2022 09:35 AM    K 4.0 12/02/2022 09:35 AM    CL 106 12/02/2022 09:35 AM    CO2 33 12/02/2022 09:35 AM    BUN 5 12/02/2022 09:35 AM    CREATININE  0.54 12/02/2022 09:35 AM    GFRAA >60 05/26/2021 08:05 AM    AGRATIO 1.2 12/02/2022 09:35 AM    AGRATIO 1.4 09/29/2022 12:00 AM    LABGLOM >60 12/02/2022 09:35 AM    LABGLOM 97 09/29/2022 12:00 AM    GLUCOSE 82 12/02/2022 09:35 AM    GLUCOSE 95 09/29/2022 12:00 AM    PROT 6.3 12/02/2022 09:35 AM    LABALBU 3.4 12/02/2022 09:35 AM    CALCIUM 9.4 12/02/2022 09:35 AM    BILITOT 0.6 12/02/2022 09:35 AM    ALKPHOS 79 12/02/2022 09:35 AM    ALKPHOS 91 09/29/2022 12:00 AM    AST 12 12/02/2022 09:35 AM    ALT 22 12/02/2022 09:35 AM             An electronic signature was used to authenticate this note.    --Jamaal Bernasconi Vear Clock, MD

## 2023-01-05 ENCOUNTER — Ambulatory Visit: Payer: MEDICARE | Attending: Orthopaedic Surgery | Primary: Internal Medicine

## 2023-01-05 NOTE — Discharge Instructions (Signed)
OUTPATIENT MEDICATIONS    SCHEDULED:    -For Pain:    Tylenol (Acetaminophen) 500 mg: Take 2 tabs by mouth every 8 hours for pain.     -For Stomach Acid control:    Pantoprazole (Protonix) 20 mg: Take 1 tablet daily while taking aspirin to prevent stomach ulcers.     -To Prevent Constipation:    Colace (Docusate sodium) 100 mg: Take 1 cap by mouth twice daily while taking narcotics to avoid constipation.    Miralax (Polyethylene glycol): Mix 17 Grams with 8 oz. juice or water and take by mouth up to twice daily as needed     -To Prevent Blood Clots    Lovenox '40mg'$ : Inject '30mg'$  subcutaneously into abdomen daily for 30 days      ONLY AS NEEDED:    Ultram (Tramadol) 50 mg: Take 1 tab by mouth every 6 hours for pain not controlled by scheduled pain medication    Oxycodone (Roxicodone) 5 mg: Take 1 tab by mouth every 4 hours as needed for pain not controlled by scheduled pain medication and Ultram (Tramadol)       DISCHARGE ACTIVITY  ?   Walking is the most important therapy after a hip replacement. Gradually increase your walking daily. Start by walking daily 5-10 steps every hour while awake. The goal is to be walking 20-30 minutes 1-2 times per day by 6 weeks post-op. You may progress to a single crutch or cane as tolerated.  ?  Weight-bearing as tolerated with walker or crutches for a minimum of 2 weeks to prevent falls. (Discuss with your surgeon at 2 week post-op appointment) Slowly transition to a cane and then nothing when you feel comfortable and safe (everyone is different). This can take anywhere from a few weeks to a few months.  ?  No running or high impact activities.  ?  You may put full weight on your hip unless instructed otherwise.  ?  HIP REPLACEMENT PRECAUTIONS    Avoid extremes of motion.  No pivoting on operative leg for 6 weeks.    SLEEP    You may sleep on your back, stomach or either side with a pillow between your knees.  You may resume sexual relations according to your comfort and must avoid  extremes of motion.    BLOOD CLOT PREVENTION    You will be on a medication to prevent blood clots for 4-6 weeks after surgery. Take as directed until all tablets are taken.  ?   Walking regularly every day helps decrease the risk of blood clots!    COMPRESSION STOCKINGS     Wear knee-high compression stockings during the day as needed until swelling resolves. Remove stockings at bedtime.    You may use an ace wrap instead of stockings to help with swelling. Apply the ace wrap from middle of calf to middle of thigh.    WOUND DRESSING INSTRUCTIONS    Please do not remove the bandage. Keep in place until follow-up appointment 10-14 days after surgery.    Call my scheduler, Alyse Low, with any new drainage 5 days after surgery or any other wound questions: [phone]    SHOWERING  ?   It is OK to shower ONLY if incision remains dry.  ?   You may shower with the outer dressing in place, immediately following surgery. Make sure the edges are sealed to avoid getting the wound wet. If becomes wet under bandage, remove and call the [place] office.  After bandage is removed (at 2 week postoperative visit), you may shower.  DO NOT scrub your incision, but allow soapy water to flow over it and pat dry.   ?   Do not wash or scrub the incision for 6 weeks.  ?   Do not apply topical antibiotics, creams, lotions, ointments, etc. to the incision for 6 weeks post-op.    Do not submerge in water (pool/tub/bath/ocean/lake/etc.) until 4-6 weeks after surgery or until the incision has healed with no scabs remaining.    ICE  ?   Inflammation in the operative leg is normal for several months after surgery  ?   Use ice several times a day for 30 minutes at a time if ice pack or continuously if using the Ice circulating machine.     Do not apply heat to the wound.    PROCEDURES    Any elective dental cleaning/procedure or invasive procedures like a colonoscopy should not be performed within 3 months following a joint replacement.  ?   You  must take antibiotic prophylaxis before any dental cleaning or other invasive procedure.    DRIVING  ?   Necessary travel only for 6 weeks.  ?   You should not drive until you are able to walk WITHOUT a walker (a cane is OK) and are not taking any narcotic pain medications. This is usually around 3-4 weeks.  ?   Most patients do not require a handicapped parking pass. If necessary, we will provide one for a maximum duration of 3 months.    QUESTIONS/CONCERNS    Call the [facility] front desk at [phone] and request to speak to the Orthopedic Surgery on-call physician.     Medication Requests: Call the [facility] main number at [phone], or the [facility] front desk at [phone] and request to speak to the Orthopedic Surgery on-call [place].     POSTOPERATIVE APPOINTMENT    We will monitor your progress with follow-up clinic visits at approximately 2 and 6 weeks following your surgery.  ?   Follow-up at [facility] clinic as scheduled previously. Phone: [phone]

## 2023-01-07 NOTE — Telephone Encounter (Signed)
Attempted to reach patient to provide preoperative education. Unable to leave VM as VM states that it is not set up at this time.

## 2023-01-08 ENCOUNTER — Observation Stay: Admit: 2023-01-08 | Payer: MEDICARE | Primary: Internal Medicine

## 2023-01-08 ENCOUNTER — Observation Stay
Admission: RE | Admit: 2023-01-08 | Discharge: 2023-01-08 | Disposition: A | Discharge status: Home / Self Care | Payer: MEDICARE | Attending: Orthopaedic Surgery | Admitting: Orthopaedic Surgery

## 2023-01-08 DIAGNOSIS — M1612 Unilateral primary osteoarthritis, left hip: Principal | ICD-10-CM

## 2023-01-08 LAB — TYPE AND SCREEN
ABO/Rh: O POS
Antibody Screen: NEGATIVE

## 2023-01-08 MED ORDER — ROCURONIUM BROMIDE 50 MG/5ML IV SOLN
50 MG/5ML | INTRAVENOUS | Status: DC | PRN
Start: 2023-01-08 — End: 2023-01-08
  Administered 2023-01-08: 14:00:00 10 via INTRAVENOUS
  Administered 2023-01-08: 13:00:00 25 via INTRAVENOUS
  Administered 2023-01-08 (×2): 5 via INTRAVENOUS

## 2023-01-08 MED ORDER — ACETAMINOPHEN 500 MG PO TABS
500 MG | Freq: Three times a day (TID) | ORAL | Status: DC
Start: 2023-01-08 — End: 2023-01-08
  Administered 2023-01-08: 18:00:00 1000 mg via ORAL

## 2023-01-08 MED ORDER — PHENYLEPHRINE HCL 1 MG/10ML IV SOSY
1 MG/0ML | INTRAVENOUS | Status: DC | PRN
Start: 2023-01-08 — End: 2023-01-08
  Administered 2023-01-08: 15:00:00 200 via INTRAVENOUS
  Administered 2023-01-08: 14:00:00 100 via INTRAVENOUS
  Administered 2023-01-08: 15:00:00 200 via INTRAVENOUS
  Administered 2023-01-08 (×3): 100 via INTRAVENOUS

## 2023-01-08 MED ORDER — ROPIVACAINE HCL 5 MG/ML IJ SOLN
INTRAMUSCULAR | Status: AC
Start: 2023-01-08 — End: 2023-01-08

## 2023-01-08 MED ORDER — SODIUM CHLORIDE (PF) 0.9 % IJ SOLN
0.9 | INTRAMUSCULAR | Status: AC
Start: 2023-01-08 — End: 2023-01-08

## 2023-01-08 MED ORDER — TRANEXAMIC ACID 1000 MG/10ML IV SOLN
1000 MG/10ML | Freq: Once | INTRAVENOUS | Status: DC | PRN
Start: 2023-01-08 — End: 2023-01-08

## 2023-01-08 MED ORDER — VANCOMYCIN HCL 1 G IV SOLR
1 g | INTRAVENOUS | Status: DC | PRN
Start: 2023-01-08 — End: 2023-01-08
  Administered 2023-01-08: 15:00:00 1000 via TOPICAL

## 2023-01-08 MED ORDER — FAMOTIDINE 20 MG PO TABS
20 MG | Freq: Once | ORAL | Status: AC
Start: 2023-01-08 — End: 2023-01-08
  Administered 2023-01-08: 12:00:00 20 mg via ORAL

## 2023-01-08 MED ORDER — SODIUM CHLORIDE (PF) 0.9 % IJ SOLN
0.9 | INTRAMUSCULAR | Status: DC | PRN
Start: 2023-01-08 — End: 2023-01-08
  Administered 2023-01-08: 14:00:00 50 via INTRA_ARTICULAR

## 2023-01-08 MED ORDER — ALBUTEROL SULFATE HFA 108 (90 BASE) MCG/ACT IN AERS
108 (90 Base) MCG/ACT | RESPIRATORY_TRACT | Status: DC | PRN
Start: 2023-01-08 — End: 2023-01-08
  Administered 2023-01-08: 15:00:00 4 via RESPIRATORY_TRACT
  Administered 2023-01-08: 12:00:00 2 via RESPIRATORY_TRACT

## 2023-01-08 MED ORDER — SODIUM CHLORIDE 0.9 % IR SOLN
0.9 % | Status: DC | PRN
Start: 2023-01-08 — End: 2023-01-08
  Administered 2023-01-08: 13:00:00

## 2023-01-08 MED ORDER — SODIUM CHLORIDE 0.9 % IV SOLN
0.9 % | INTRAVENOUS | Status: DC | PRN
Start: 2023-01-08 — End: 2023-01-08

## 2023-01-08 MED ORDER — OXYCODONE HCL 5 MG PO TABS
5 MG | Freq: Once | ORAL | Status: DC
Start: 2023-01-08 — End: 2023-01-08

## 2023-01-08 MED ORDER — PANTOPRAZOLE SODIUM 40 MG PO TBEC
40 | ORAL_TABLET | Freq: Every day | ORAL | 0 refills | Status: DC
Start: 2023-01-08 — End: 2023-03-01
  Filled 2023-01-08: qty 30, 30d supply, fill #0

## 2023-01-08 MED ORDER — SODIUM CHLORIDE 0.9 % IV SOLN
0.9 | INTRAVENOUS | Status: AC
Start: 2023-01-08 — End: 2023-01-08

## 2023-01-08 MED ORDER — NORMAL SALINE FLUSH 0.9 % IV SOLN
0.9 % | INTRAVENOUS | Status: DC | PRN
Start: 2023-01-08 — End: 2023-01-08

## 2023-01-08 MED ORDER — LACTATED RINGERS IV SOLN
INTRAVENOUS | Status: DC
Start: 2023-01-08 — End: 2023-01-08
  Administered 2023-01-08: 12:00:00 via INTRAVENOUS

## 2023-01-08 MED ORDER — ONDANSETRON 4 MG PO TBDP
4 MG | Freq: Three times a day (TID) | ORAL | Status: DC | PRN
Start: 2023-01-08 — End: 2023-01-08

## 2023-01-08 MED ORDER — OXYCODONE HCL 5 MG PO TABS
5 MG | ORAL | Status: DC | PRN
Start: 2023-01-08 — End: 2023-01-08

## 2023-01-08 MED ORDER — LIDOCAINE HCL (PF) 1 % IJ SOLN
1 % | Freq: Once | INTRAMUSCULAR | Status: DC | PRN
Start: 2023-01-08 — End: 2023-01-08

## 2023-01-08 MED ORDER — POVIDONE-IODINE 5 % OP SOLN
5 | OPHTHALMIC | Status: AC
Start: 2023-01-08 — End: 2023-01-08

## 2023-01-08 MED ORDER — ENOXAPARIN SODIUM 30 MG/0.3ML IJ SOSY
30 | Freq: Every day | INTRAMUSCULAR | 0 refills | Status: AC
Start: 2023-01-08 — End: 2023-02-07
  Filled 2023-01-26: qty 9, 30d supply, fill #0

## 2023-01-08 MED ORDER — EPINEPHRINE (ANAPHYLAXIS) 1 MG/ML IJ SOLN
1 | INTRAMUSCULAR | Status: AC
Start: 2023-01-08 — End: 2023-01-08

## 2023-01-08 MED ORDER — ONDANSETRON HCL 4 MG/2ML IJ SOLN
4 MG/2ML | Freq: Once | INTRAMUSCULAR | Status: DC | PRN
Start: 2023-01-08 — End: 2023-01-08

## 2023-01-08 MED ORDER — OXYCODONE HCL 5 MG PO TABS
5 | ORAL_TABLET | Freq: Four times a day (QID) | ORAL | 0 refills | Status: AC | PRN
Start: 2023-01-08 — End: 2023-01-15
  Filled 2023-01-08: qty 10, 3d supply, fill #0

## 2023-01-08 MED ORDER — ROCURONIUM BROMIDE 50 MG/5ML IV SOLN
50 | INTRAVENOUS | Status: AC
Start: 2023-01-08 — End: 2023-01-08

## 2023-01-08 MED ORDER — GLYCOPYRROLATE 0.4 MG/2ML IJ SOLN
0.4 MG/2ML | INTRAMUSCULAR | Status: DC | PRN
Start: 2023-01-08 — End: 2023-01-08
  Administered 2023-01-08: 15:00:00 .4 via INTRAVENOUS

## 2023-01-08 MED ORDER — FENTANYL CITRATE (PF) 100 MCG/2ML IJ SOLN
100 MCG/2ML | INTRAMUSCULAR | Status: DC | PRN
Start: 2023-01-08 — End: 2023-01-08
  Administered 2023-01-08 (×2): 25 ug via INTRAVENOUS

## 2023-01-08 MED ORDER — HYDROGEN PEROXIDE 3 % EX SOLN
3 % | CUTANEOUS | Status: DC | PRN
Start: 2023-01-08 — End: 2023-01-08
  Administered 2023-01-08: 14:00:00 236 via TOPICAL

## 2023-01-08 MED ORDER — HYDROMORPHONE 0.5MG/0.5ML IJ SOLN
1 MG/ML | Status: DC | PRN
Start: 2023-01-08 — End: 2023-01-08

## 2023-01-08 MED ORDER — FAMOTIDINE 20 MG PO TABS
20 MG | Freq: Two times a day (BID) | ORAL | Status: DC
Start: 2023-01-08 — End: 2023-01-08
  Administered 2023-01-08: 18:00:00 20 mg via ORAL

## 2023-01-08 MED ORDER — HYDROMORPHONE HCL 2 MG/ML IJ SOLN
2 | INTRAMUSCULAR | Status: AC
Start: 2023-01-08 — End: 2023-01-08

## 2023-01-08 MED ORDER — ONDANSETRON 8 MG PO TBDP
8 | ORAL_TABLET | Freq: Three times a day (TID) | ORAL | 0 refills | Status: DC | PRN
Start: 2023-01-08 — End: 2023-03-01
  Filled 2023-01-08: qty 10, 3d supply, fill #0

## 2023-01-08 MED ORDER — EPHEDRINE SULFATE-NACL 50-0.9 MG/5ML-% IV SOSY
50-0.9 | INTRAVENOUS | Status: AC
Start: 2023-01-08 — End: 2023-01-08

## 2023-01-08 MED ORDER — STERILE WATER FOR INJECTION (MIXTURES ONLY)
1 g | INTRAMUSCULAR | Status: AC
Start: 2023-01-08 — End: 2023-01-08
  Administered 2023-01-08: 13:00:00 2000 mg via INTRAVENOUS

## 2023-01-08 MED ORDER — PROPOFOL 200 MG/20ML IV EMUL
200 MG/20ML | INTRAVENOUS | Status: DC | PRN
Start: 2023-01-08 — End: 2023-01-08
  Administered 2023-01-08: 13:00:00 130 via INTRAVENOUS
  Administered 2023-01-08: 15:00:00 20 via INTRAVENOUS

## 2023-01-08 MED ORDER — FENTANYL CITRATE (PF) 100 MCG/2ML IJ SOLN
100 MCG/2ML | INTRAMUSCULAR | Status: DC | PRN
Start: 2023-01-08 — End: 2023-01-08
  Administered 2023-01-08 (×4): 25 via INTRAVENOUS

## 2023-01-08 MED ORDER — FENTANYL CITRATE (PF) 100 MCG/2ML IJ SOLN
100 | INTRAMUSCULAR | Status: AC
Start: 2023-01-08 — End: 2023-01-08

## 2023-01-08 MED ORDER — BISACODYL 5 MG PO TBEC
5 MG | Freq: Every day | ORAL | Status: DC
Start: 2023-01-08 — End: 2023-01-08
  Administered 2023-01-08: 20:00:00 5 mg via ORAL

## 2023-01-08 MED ORDER — SUCCINYLCHOLINE CHLORIDE 100 MG/5ML IV SOSY
100 | INTRAVENOUS | Status: AC
Start: 2023-01-08 — End: 2023-01-08

## 2023-01-08 MED ORDER — GLYCOPYRROLATE 0.4 MG/2ML IJ SOLN
0.4 | INTRAMUSCULAR | Status: AC
Start: 2023-01-08 — End: 2023-01-08

## 2023-01-08 MED ORDER — PALONOSETRON HCL 0.25 MG/5ML IV SOLN
0.25 MG/5ML | Freq: Once | INTRAVENOUS | Status: AC
Start: 2023-01-08 — End: 2023-01-08
  Administered 2023-01-08: 12:00:00 0.075 mg via INTRAVENOUS

## 2023-01-08 MED ORDER — LIDOCAINE HCL (PF) 2 % IJ SOLN
2 % | INTRAMUSCULAR | Status: DC | PRN
Start: 2023-01-08 — End: 2023-01-08
  Administered 2023-01-08: 13:00:00 60 via INTRAVENOUS

## 2023-01-08 MED ORDER — PHENYLEPHRINE HCL 1 MG/10ML IV SOSY
1 | INTRAVENOUS | Status: AC
Start: 2023-01-08 — End: 2023-01-08

## 2023-01-08 MED ORDER — ACETAMINOPHEN 500 MG PO TABS
500 MG | ORAL_TABLET | Freq: Three times a day (TID) | ORAL | 0 refills | Status: DC
Start: 2023-01-08 — End: 2024-03-07
  Filled 2023-01-08: qty 180, 30d supply, fill #0

## 2023-01-08 MED ORDER — HYDROMORPHONE HCL 2 MG/ML IJ SOLN
2 MG/ML | INTRAMUSCULAR | Status: DC | PRN
Start: 2023-01-08 — End: 2023-01-08
  Administered 2023-01-08 (×2): .4 via INTRAVENOUS

## 2023-01-08 MED ORDER — FLEET ENEMA 7-19 GM/118ML RE ENEM
7-19 GM/118ML | Freq: Every day | RECTAL | Status: DC | PRN
Start: 2023-01-08 — End: 2023-01-08

## 2023-01-08 MED ORDER — SUCCINYLCHOLINE CHLORIDE 100 MG/5ML IV SOSY
100 MG/5ML | INTRAVENOUS | Status: DC | PRN
Start: 2023-01-08 — End: 2023-01-08
  Administered 2023-01-08: 13:00:00 100 via INTRAVENOUS

## 2023-01-08 MED ORDER — NORMAL SALINE FLUSH 0.9 % IV SOLN
0.9 % | Freq: Two times a day (BID) | INTRAVENOUS | Status: DC
Start: 2023-01-08 — End: 2023-01-08

## 2023-01-08 MED ORDER — POVIDONE-IODINE 5 % OP SOLN
5 | OPHTHALMIC | Status: DC | PRN
Start: 2023-01-08 — End: 2023-01-08
  Administered 2023-01-08: 14:00:00

## 2023-01-08 MED ORDER — LACTATED RINGERS IV SOLN
INTRAVENOUS | Status: DC
Start: 2023-01-08 — End: 2023-01-08

## 2023-01-08 MED ORDER — TRAMADOL HCL 50 MG PO TABS
50 | ORAL_TABLET | Freq: Four times a day (QID) | ORAL | 0 refills | Status: DC | PRN
Start: 2023-01-08 — End: 2023-01-15
  Filled 2023-01-08: qty 28, 7d supply, fill #0

## 2023-01-08 MED ORDER — SODIUM CHLORIDE 0.9 % IV SOLN (MINI-BAG)
0.9 | INTRAVENOUS | Status: AC
Start: 2023-01-08 — End: 2023-01-08
  Administered 2023-01-08 (×2): 1000 mg via INTRAVENOUS

## 2023-01-08 MED ORDER — FAMOTIDINE (PF) 20 MG/2ML IV SOLN
20 MG/2ML | Freq: Two times a day (BID) | INTRAVENOUS | Status: DC
Start: 2023-01-08 — End: 2023-01-08

## 2023-01-08 MED ORDER — NEOSTIGMINE METHYLSULFATE 3 MG/3ML IV SOSY
3 | INTRAVENOUS | Status: AC
Start: 2023-01-08 — End: 2023-01-08

## 2023-01-08 MED ORDER — STERILE WATER FOR INJECTION (MIXTURES ONLY)
1 | Freq: Three times a day (TID) | INTRAMUSCULAR | Status: DC
Start: 2023-01-08 — End: 2023-01-08
  Administered 2023-01-08: 22:00:00 2000 mg via INTRAVENOUS

## 2023-01-08 MED ORDER — ONDANSETRON HCL 4 MG/2ML IJ SOLN
4 | INTRAMUSCULAR | Status: AC
Start: 2023-01-08 — End: 2023-01-08

## 2023-01-08 MED ORDER — POLYETHYLENE GLYCOL 3350 17 G PO PACK
17 g | Freq: Every day | ORAL | Status: DC | PRN
Start: 2023-01-08 — End: 2023-01-08

## 2023-01-08 MED ORDER — ALBUTEROL SULFATE HFA 108 (90 BASE) MCG/ACT IN AERS
108 | RESPIRATORY_TRACT | Status: AC
Start: 2023-01-08 — End: 2023-01-08

## 2023-01-08 MED ORDER — OXYCODONE HCL 10 MG PO TABS
10 MG | ORAL | Status: DC | PRN
Start: 2023-01-08 — End: 2023-01-08

## 2023-01-08 MED ORDER — LIDOCAINE HCL (PF) 2 % IJ SOLN
2 | INTRAMUSCULAR | Status: AC
Start: 2023-01-08 — End: 2023-01-08

## 2023-01-08 MED ORDER — PROPOFOL 200 MG/20ML IV EMUL
200 | INTRAVENOUS | Status: AC
Start: 2023-01-08 — End: 2023-01-08

## 2023-01-08 MED ORDER — ONDANSETRON HCL 4 MG/2ML IJ SOLN
4 MG/2ML | INTRAMUSCULAR | Status: DC | PRN
Start: 2023-01-08 — End: 2023-01-08
  Administered 2023-01-08: 14:00:00 4 via INTRAVENOUS

## 2023-01-08 MED ORDER — ENOXAPARIN SODIUM 40 MG/0.4ML IJ SOSY
40 MG/0.4ML | Freq: Every day | INTRAMUSCULAR | Status: DC
Start: 2023-01-08 — End: 2023-01-08

## 2023-01-08 MED ORDER — HYDROXYZINE HCL 10 MG PO TABS
10 MG | Freq: Three times a day (TID) | ORAL | Status: DC | PRN
Start: 2023-01-08 — End: 2023-01-08

## 2023-01-08 MED ORDER — DEXAMETHASONE SOD PHOSPHATE PF 10 MG/ML IJ SOLN
10 MG/ML | Freq: Once | INTRAMUSCULAR | Status: AC
Start: 2023-01-08 — End: 2023-01-08
  Administered 2023-01-08: 13:00:00 10 mg via INTRAVENOUS

## 2023-01-08 MED ORDER — NEOSTIGMINE METHYLSULFATE 3 MG/3ML IV SOSY
3 MG/ML | INTRAVENOUS | Status: DC | PRN
Start: 2023-01-08 — End: 2023-01-08
  Administered 2023-01-08: 15:00:00 3 via INTRAVENOUS

## 2023-01-08 MED ORDER — ACETAMINOPHEN 500 MG PO TABS
500 MG | Freq: Once | ORAL | Status: AC
Start: 2023-01-08 — End: 2023-01-08
  Administered 2023-01-08: 12:00:00 1000 mg via ORAL

## 2023-01-08 MED ORDER — KETOROLAC TROMETHAMINE 15 MG/ML IJ SOLN
15 | INTRAMUSCULAR | Status: AC
Start: 2023-01-08 — End: 2023-01-08

## 2023-01-08 MED ORDER — VANCOMYCIN HCL 1 G IV SOLR
1 | INTRAVENOUS | Status: AC
Start: 2023-01-08 — End: 2023-01-08

## 2023-01-08 MED ORDER — DOCUSATE SODIUM 100 MG PO CAPS
100 | ORAL_CAPSULE | Freq: Two times a day (BID) | ORAL | 0 refills | Status: AC
Start: 2023-01-08 — End: 2023-02-07
  Filled 2023-01-08: qty 60, 30d supply, fill #0

## 2023-01-08 MED ORDER — ONDANSETRON HCL 4 MG/2ML IJ SOLN
4 MG/2ML | Freq: Four times a day (QID) | INTRAMUSCULAR | Status: DC | PRN
Start: 2023-01-08 — End: 2023-01-08

## 2023-01-08 MED FILL — DEXAMETHASONE SOD PHOSPHATE PF 10 MG/ML IJ SOLN: 10 mg/mL | INTRAMUSCULAR | Qty: 1

## 2023-01-08 MED FILL — FENTANYL CITRATE (PF) 100 MCG/2ML IJ SOLN: 100 MCG/2ML | INTRAMUSCULAR | Qty: 2

## 2023-01-08 MED FILL — CEFAZOLIN SODIUM 1 G IJ SOLR: 1 g | INTRAMUSCULAR | Qty: 2000

## 2023-01-08 MED FILL — KETOROLAC TROMETHAMINE 15 MG/ML IJ SOLN: 15 mg/mL | INTRAMUSCULAR | Qty: 2

## 2023-01-08 MED FILL — TRANEXAMIC ACID 1000 MG/10ML IV SOLN: 1000 MG/10ML | INTRAVENOUS | Qty: 10

## 2023-01-08 MED FILL — BISACODYL EC 5 MG PO TBEC: 5 mg | ORAL | Qty: 1

## 2023-01-08 MED FILL — LACTATED RINGERS IV SOLN: INTRAVENOUS | Qty: 1000

## 2023-01-08 MED FILL — ALBUTEROL SULFATE HFA 108 (90 BASE) MCG/ACT IN AERS: 108 (90 Base) MCG/ACT | RESPIRATORY_TRACT | Qty: 6.7

## 2023-01-08 MED FILL — BD POSIFLUSH 0.9 % IV SOLN: 0.9 % | INTRAVENOUS | Qty: 40

## 2023-01-08 MED FILL — ACETAMINOPHEN EXTRA STRENGTH 500 MG PO TABS: 500 mg | ORAL | Qty: 2

## 2023-01-08 MED FILL — FLEET ENEMA 7-19 GM/118ML RE ENEM: 7-19 GM/118ML | RECTAL | Qty: 1

## 2023-01-08 MED FILL — ROCURONIUM BROMIDE 50 MG/5ML IV SOLN: 50 MG/5ML | INTRAVENOUS | Qty: 5

## 2023-01-08 MED FILL — EPHEDRINE SULFATE-NACL 50-0.9 MG/5ML-% IV SOSY: 50-0.9 MG/5ML-% | INTRAVENOUS | Qty: 5

## 2023-01-08 MED FILL — ADRENALIN 1 MG/ML IJ SOLN: 1 mg/mL | INTRAMUSCULAR | Qty: 1

## 2023-01-08 MED FILL — XYLOCAINE-MPF 2 % IJ SOLN: 2 % | INTRAMUSCULAR | Qty: 5

## 2023-01-08 MED FILL — VANCOMYCIN HCL 1 G IV SOLR: 1 g | INTRAVENOUS | Qty: 1000

## 2023-01-08 MED FILL — FAMOTIDINE 20 MG PO TABS: 20 mg | ORAL | Qty: 1

## 2023-01-08 MED FILL — NEOSTIGMINE METHYLSULFATE 3 MG/3ML IV SOSY: 3 mg/mL | INTRAVENOUS | Qty: 3

## 2023-01-08 MED FILL — BETADINE OPHTHALMIC PREP 5 % OP SOLN: 5 % | OPHTHALMIC | Qty: 30

## 2023-01-08 MED FILL — ONDANSETRON HCL 4 MG/2ML IJ SOLN: 4 MG/2ML | INTRAMUSCULAR | Qty: 2

## 2023-01-08 MED FILL — PHENYLEPHRINE HCL (PRESSORS) 1 MG/10ML IV SOSY: 1 MG/0ML | INTRAVENOUS | Qty: 10

## 2023-01-08 MED FILL — DILAUDID 2 MG/ML IJ SOLN: 2 mg/mL | INTRAMUSCULAR | Qty: 1

## 2023-01-08 MED FILL — SODIUM CHLORIDE (PF) 0.9 % IJ SOLN: 0.9 % | INTRAMUSCULAR | Qty: 10

## 2023-01-08 MED FILL — PALONOSETRON HCL 0.25 MG/5ML IV SOLN: 0.25 MG/5ML | INTRAVENOUS | Qty: 5

## 2023-01-08 MED FILL — SUCCINYLCHOLINE CHLORIDE 100 MG/5ML IV SOSY: 100 MG/5ML | INTRAVENOUS | Qty: 5

## 2023-01-08 MED FILL — SODIUM CHLORIDE 0.9 % IV SOLN: 0.9 % | INTRAVENOUS | Qty: 250

## 2023-01-08 MED FILL — NAROPIN 5 MG/ML IJ SOLN: 5 mg/mL | INTRAMUSCULAR | Qty: 40

## 2023-01-08 MED FILL — GLYCOPYRROLATE 0.4 MG/2ML IJ SOLN: 0.4 MG/2ML | INTRAMUSCULAR | Qty: 2

## 2023-01-08 MED FILL — DIPRIVAN 200 MG/20ML IV EMUL: 200 MG/20ML | INTRAVENOUS | Qty: 20

## 2023-01-08 NOTE — Other (Signed)
Rounded on patient s/p left total hip replacement with Dr. Garner Nash, [facility] 01/08/2023. Patient observed to be alert and oriented x 3, sitting up in bed. She  denies chest pain, shortness of breath, nausea or vomiting. She states that her pain is well controlled at present. She denies any numbness in her bilateral lower extremities. She is able to perform a straight leg raise with her left leg. Dressing to posterior hip is observed to be clean, dry and intact with ice pack in  place for comfort. Patient seated up at 90 degrees and provided with glass of water. She denies any lightheadedness or dizziness.  Patient provided with total hip replacement education book, medication education sheet, medication schedule and ted hose. Patient reminded that ted hose are for daytime wear only to help with swelling. Reviewed the use of incentive spirometry. Patient encouraged to use ten times hourly while in hospital and to continue use at home in the morning hours to keep lungs expanded and free from complications. Reviewed postoperative showering instructions. Patient reminded that she may shower on Sunday, no tubs or submersion in water. She is to contact clinic with any dressing issues. Reminded patient of the importance of ambulation to her recovery. Encouraged hourly ambulation 5- 10 minutes every hour, followed by icing 20 minutes, not to be placed directly on her skin. Patient has all required DME at home and a support system in place. She has already obtained her postoperative medications. She lives with her daughter in a 2 story home, her bedroom is on the first floor. There are 2 steps to enter the home. Patient wishes to discharge home today once she clears PT/OT. Patient ambulated to bedside chair with steady gait and use of rolling walker to bedside chair .  Patient left in position of comfort with call bell within reach. Lunch provided. She has no questions or concerns at this time. Will continue to monitor until  discharge and then postoperatively.

## 2023-01-08 NOTE — Anesthesia Post-Procedure Evaluation (Signed)
Department of Anesthesiology  Postprocedure Note    Patient: Elaine [name]  MRN: [id number]  Birthdate: 1951/05/04  Date of evaluation: 01/08/2023    Procedure Summary       Date: 01/08/23 Room / Location: [facility] MAIN 07 / [facility] MAIN OR    Anesthesia Start: 0725 Anesthesia Stop: 1024    Procedure: LEFT HIP TOTAL ARTHROPLASTY LATERAL APPROACH (Left: Hip) Diagnosis:       Osteoarthritis of left hip, unspecified osteoarthritis type      (Osteoarthritis of left hip, unspecified osteoarthritis type [M16.12])    Surgeons: Merdis Delay, DO Responsible Provider: Jonette Pesa, MD    Anesthesia Type: General ASA Status: 3            Anesthesia Type: General    Aldrete Phase I: Aldrete Score: 8    Aldrete Phase II:      Anesthesia Post Evaluation    Patient location during evaluation: PACU  Patient participation: complete - patient participated  Level of consciousness: awake and alert  Airway patency: patent  Nausea & Vomiting: no nausea  Cardiovascular status: blood pressure returned to baseline  Respiratory status: acceptable  Hydration status: euvolemic  Pain management: adequate    No notable events documented.

## 2023-01-08 NOTE — Progress Notes (Signed)
Received report from  [place]. Patient received from [place] stable and alert and oriented x4. Patient was set up and educated on call bell and incentative spirometry use.  Ambulated to chair with walker x 1 assistance

## 2023-01-08 NOTE — Progress Notes (Signed)
Patient is alert and oriented x4,  patient ambulated in hallway with PT and up steps, tolerated well. Patient tolerating meals 100 %. Patient drinking fluids. No voiding noted, encouraged to drink fluids. Independent with incentative spirometry. Call bell in use

## 2023-01-08 NOTE — Progress Notes (Signed)
Physical Therapy  PHYSICAL THERAPY EVALUATION/DISCHARGE    Patient: Elaine [name] (72 y.o. female)  Date: 01/08/2023  Primary Diagnosis: Osteoarthritis of left hip, unspecified osteoarthritis type [M16.12]  Status post left hip replacement [Z96.642]  Procedure(s) (LRB):  LEFT HIP TOTAL ARTHROPLASTY LATERAL APPROACH (Left) Day of Surgery   Precautions: Fall Risk, ROM Restrictions, Surgical protocol,  , Hip Precautions: Posterior hip precautions  PLOF: Pt lives with Daughter in two story home with 2 steps to enter, bedroom on main level.  Pt was independent with all mobility and transfers, has RW.     ASSESSMENT AND RECOMMENDATIONS:  Patient is 72 yo female admitted to hospital for THA and presents today 0 day post op and agreeable to therapy. Patient was educated on weight bearing status, hip precautions, and role of therapy. Patient transferred to sitting edge of recliner for objective assessment. Patient was given demo with instruction on sit <> stand transfer and gait training. Patient transferred to standing with RW and ambulated 150 feet. Patient demonstrated good compliance with precautions throughout session.  Pt demonstrated good ability to safely negotiate 4 steps with use of b/l hand rail.  Pt educated on HEP, getting up to walk for 5-10 min every hour and signs of DVT and infection and to contact MD with any concerns.  At conclusion of session patient transferred to sitting in recliner and was left resting with call bell by the side and family present. Patient instructed to call for assistance if they needed to get up for any reason and denied need for further assistance.  Pt is discharged/cleared from all skilled PT services in acute care setting.    Patient does not require further skilled physical therapy intervention at this level of care.    Further Equipment Recommendations for Discharge:  none    AMPAC: AM-PAC Inpatient Mobility Raw Score : 21        At this time and based on an AM-PAC score, no  further PT is recommended upon discharge due to (i.e. patient at baseline functional status.etc.).  Recommend patient returns to prior setting with prior services.    This AMPAC score should be considered in conjunction with interdisciplinary team recommendations to determine the most appropriate discharge setting. Patient's social support, diagnosis, medical stability, and prior level of function should also be taken into consideration.     SUBJECTIVE:   Patient stated "I'm feeling good, pain isn't too bad."    OBJECTIVE DATA SUMMARY:     Past Medical History:   Diagnosis Date    Anemia     Asthma     Colon polyp     DDD (degenerative disc disease) 02/15/2009    Depression 12/18/2011    DVT (deep venous thrombosis) ([place]) 10/23/2009    Left leg:  No DVT.      Essential hypertension, benign     History of blood transfusion     History of echocardiogram 03/20/2004    EF 70%.  Borderline DDfx.  No significant valvular pathology.    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  Neg EKG on pharm stress test.    Thyroid disease     hypothyroidism     Past Surgical History:   Procedure Laterality Date    COLONOSCOPY N/A 04/25/2020    SCREENING COLONOSCOPY performed by Sloan Leiter, MD at [facility]    COLONOSCOPY  02-24-12    normal (Dr. Angelique Holm)    COLONOSCOPY FLX DX W/COLLJ 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 performed by Sloan Leiter, MD at [place] Situation:  Social/Functional History  Lives With: Spouse  Type of Home: House  Home Layout: Two level  Home Access: Stairs to enter with rails  Entrance Stairs - Number of Steps: 2  Entrance Stairs - Rails: Both  Home Equipment: Walker, rolling  Has the patient had two or more falls in the past year or any fall with injury in the past year?: No  Receives Help From: Family  ADL Assistance: Independent  Homemaking Assistance: Independent  Ambulation Assistance: Independent  Transfer Assistance: Independent  Active Driver: Yes  Mode of Transportation: Musician  Occupation: Full time employment  Type of Occupation: Copy  Critical Behavior:  Orientation  Overall Orientation Status: Within Normal Limits  Orientation Level: Oriented X4  Cognition  Overall Cognitive Status: WNL    Strength:    Strength: Generally decreased, functional    Tone & Sensation:   Tone: Normal  Sensation: Intact    Range Of Motion:  AROM: Generally decreased, functional  PROM: Generally decreased, functional    Functional Mobility:  Bed Mobility:     Bed Mobility Training  Bed Mobility Training: No  Transfers:     Pharmacologist: Yes  Sit to Stand: Modified independent  Stand to Sit: Modified independent  Stand Pivot Transfers: Modified independent  Balance:               Balance  Sitting: Intact  Standing: With support  Standing - Static: Good  Standing - Dynamic: Good          Ambulation/Gait Training:                       Gait  Gait Training: Yes  Overall Level of Assistance: Modified independent  Distance (ft): 150 Feet  Assistive Device: Walker, rolling  Speed/Cadence: Slow  Step Length: Left  shortened;Right shortened  Rail Use: Both  Stairs - Level of Assistance: Supervision  Number of Stairs Trained: 4                 Therapeutic Exercises/Neuromuscular Re-education:     Pain:  Pain level pre-treatment: 3/10   Pain level post-treatment: 3/10   Pain Intervention(s): Medication (see MAR); Rest, Ice, Repositioning  Response to intervention: Nurse notified     Activity  Tolerance:   Activity Tolerance: Patient tolerated evaluation without incident  Please refer to the flowsheet for vital signs taken during this treatment.    After treatment:   '[x]'$          Patient left in no apparent distress sitting up in chair  '[]'$          Patient left in no apparent distress in bed  '[x]'$          Call bell left within reach  '[x]'$          Nursing notified  '[x]'$          Caregiver present  '[]'$          Bed alarm activated  '[]'$          SCDs applied    COMMUNICATION/EDUCATION:   Patient Education  Education Given To: Patient;Family  Education Provided: Role of Therapy;Plan of Care;Home Exercise Program;Transfer Training;Equipment;Fall Prevention Strategies;Family Education  Education Method: Verbal;Teach Back  Barriers to Learning: None  Education Outcome: Verbalized understanding;Demonstrated understanding    Thank you for this referral.  Velora Heckler, PT  Minutes: 26      Eval Complexity: Decision Making: Low Complexity

## 2023-01-08 NOTE — Anesthesia Pre-Procedure Evaluation (Signed)
Department of Anesthesiology  Preprocedure Note       Name:  Elaine [name]   Age:  72 y.o.  DOB:  30-Jan-1951                                          MRN:  [id number]         Date:  01/08/2023      Surgeon: Juliann Mule):  Garner Nash, Viona Gilmore, DO    Procedure: Procedure(s):  LEFT TOTAL HIP ARTHROPLASTY LATERAL APPROACH; [[facility]]; FLAT PLATE XRAY; PEG BOARD; 2 SA'S; 23 HR    Medications prior to admission:   Prior to Admission medications    Medication Sig Start Date End Date Taking? Authorizing Provider   Misc Natural Products (NEURIVA PO) Take 1 tablet by mouth daily   Yes [provider]   NONFORMULARY Viviscal -1 tablet twice a day for hair and scalp   Yes [provider]   amLODIPine (NORVASC) 5 MG tablet Take 1 tablet by mouth daily 12/30/22   Hunte, Ninfa Meeker, MD   gabapentin (NEURONTIN) 300 MG capsule Take 1 capsule by mouth 3 times daily for 180 days. Max Daily Amount: 900 mg 11/24/22 05/23/23  Thana Farr, MD   potassium chloride (KLOR-CON M) 10 MEQ extended release tablet TAKE 1 TABLET EVERY DAY 11/24/22   Carlyle Dolly, MD   levothyroxine (SYNTHROID) 100 MCG tablet Take 1 tablet by mouth daily 10/06/22   Hunte, Ninfa Meeker, MD   atorvastatin (LIPITOR) 40 MG tablet TAKE 1 TABLET EVERY DAY (NEED MD APPOINTMENT) 09/16/22   Thana Farr, MD   montelukast (SINGULAIR) 10 MG tablet  09/01/22   [provider]   albuterol sulfate HFA (PROVENTIL;VENTOLIN;PROAIR) 108 (90 Base) MCG/ACT inhaler Inhale 2 puffs into the lungs every 4 hours as needed for Wheezing or Shortness of Breath  Patient not taking: Reported on 01/04/2023 09/10/22   Thana Farr, MD   cyanocobalamin 1000 MCG tablet Take 2.5 tablets by mouth daily    [provider]   ZINC PO Take 1 tablet by mouth daily    Automatic Reconciliation, Ar   vitamin D3 (CHOLECALCIFEROL) 125 MCG (5000 UT) TABS tablet Take 1 tablet by mouth daily    Automatic Reconciliation, Ar   iron dextran complex (INFED) 50 MG/ML injection  Infuse 19.5 mLs intravenously Pt gets these every other month 10/06/21   Automatic Reconciliation, Ar   latanoprost (XALATAN) 0.005 % ophthalmic solution Place 1 drop into both eyes nightly 02/06/17   Automatic Reconciliation, Ar   lidocaine (LIDODERM) 5 % Place 1 patch onto the skin every 24 hours 07/29/21   Automatic Reconciliation, Ar   pantoprazole (PROTONIX) 40 MG tablet Take 1 tablet by mouth daily  Patient not taking: Reported on 01/04/2023 11/03/21   Automatic Reconciliation, Ar       Current medications:    Current Facility-Administered Medications   Medication Dose Route Frequency Provider Last Rate Last Admin   . lidocaine PF 1 % injection 1 mL  1 mL IntraDERmal Once PRN Filiberto Pinks, APRN - CRNA       . famotidine (PEPCID) tablet 20 mg  20 mg Oral Once Filiberto Pinks, APRN - CRNA       . lactated ringers IV soln infusion   IntraVENous Continuous Caras, Dyke Maes, APRN - CRNA       .  sodium chloride flush 0.9 % injection 5-40 mL  5-40 mL IntraVENous PRN Caras, Dyke Maes, APRN - CRNA       . palonosetron (ALOXI) injection 0.075 mg  0.075 mg IntraVENous Once Duplain, Kaylah, PA-C       . acetaminophen (TYLENOL) tablet 1,000 mg  1,000 mg Oral Once Duplain, Kaylah, PA-C       . dexAMETHasone (PF) (DECADRON) injection 10 mg  10 mg IntraVENous Once Duplain, Kaylah, PA-C       . tranexamic acid (CYKLOKAPRON) 1,000 mg in sodium chloride 0.9 % 110 mL IVPB (mini-bag)  1,000 mg IntraVENous On Call to OR Duplain, Kaylah, PA-C       . tranexamic acid (CYKLOKAPRON) 1,000 mg in sodium chloride 0.9 % 110 mL IVPB (mini-bag)  1,000 mg IntraVENous Once PRN Duplain, Kaylah, PA-C       . sodium chloride flush 0.9 % injection 5-40 mL  5-40 mL IntraVENous 2 times per day Duplain, Kaylah, PA-C       . sodium chloride flush 0.9 % injection 5-40 mL  5-40 mL IntraVENous PRN Duplain, Kaylah, PA-C       . 0.9 % sodium chloride infusion   IntraVENous PRN Duplain, Kaylah, PA-C       . ceFAZolin (ANCEF) 2,000 mg in sterile water 20 mL IV  syringe  2,000 mg IntraVENous On Call to OR Duplain, Kaylah, PA-C           Allergies:    Allergies   Allergen Reactions   . Aspirin Other (See Comments)     GI distress,    . Diphenhydramine Other (See Comments)     Muscle jerking   . Pollen Extract      Other reaction(s): Not Reported This Time       Problem List:    Patient Active Problem List   Diagnosis Code   . Pre-diabetes R73.03   . Vitamin D deficiency E55.9   . Osteoarthritis of left knee M17.12   . Sciatica M54.30   . URI (upper respiratory infection) J06.9   . Routine general medical examination at a health care facility Z00.00   . Cough R05.9   . Encounter for long-term (current) use of medications Z79.899   . Iron deficiency anemia D50.9   . Need for pneumococcal vaccination Z23   . Chronic midline low back pain without sciatica M54.50, G89.29   . Depression F32.A   . DOE (dyspnea on exertion) R06.09   . HTN (hypertension), benign I10   . Memory problem R41.3   . Meralgia paraesthetica G57.10   . Migraine without aura and without status migrainosus, not intractable G43.009   . Claustrophobia F40.240   . Class 1 obesity with body mass index (BMI) of 34.0 to 34.9 in adult E66.9, Z68.34   . Rash R21   . Hypothyroidism E03.9   . Post-menopausal Z78.0   . 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 Asthma J45.909   . Chest pain R07.9   . Chronic diastolic (congestive) heart failure (HCC) I50.32   . Sore throat J02.9   . Submental lymphadenopathy R59.0   . Glaucoma screening Z13.5   . Left knee pain M25.562   . Pure hypercholesterolemia, unspecified E78.00   . GERD (gastroesophageal reflux disease) K21.9   . Abnormal stress test R94.39   . Dehydration E86.0   . Need for hepatitis C screening test Z11.59   . Swelling of left lower extremity M79.89   . Hospital discharge follow-up  Z09   . Muscle spasm of left lower extremity M62.838   . Left leg pain M79.605   . Needs flu shot Z23   . Primary osteoarthritis of left hip M16.12   . Gastrointestinal hemorrhage K92.2    . Right renal atrophy N26.1   . Status post left hip replacement [phone]       Past Medical History:        Diagnosis Date   . Anemia    . Asthma    . Colon polyp    . DDD (degenerative disc disease) 02/15/2009   . Depression 12/18/2011   . DVT (deep venous thrombosis) ([place]) 10/23/2009    Left leg:  No DVT.     [name] Essential hypertension, benign    . History of blood transfusion    . History of echocardiogram 03/20/2004    EF 70%.  Borderline DDfx.  No significant valvular pathology.   . Hypercholesterolemia    . Hypertension    . Iron deficiency anemia 02/15/2009   . Lichen planus 7/51/0258   . Meralgia paraesthetica 10/22/2013   . OA (osteoarthritis of spine) 02/15/2009   . OA (osteoarthritis of spine) 02/15/2009   . Obesity, unspecified    . Other and unspecified hyperlipidemia    . Pre-operative cardiovascular examination     For spine surgery   . Right sided sciatica    . Shortness of breath     Possible asthma, HCVD; less likely CAD (Noted 03/15/09)   . Sleep apnea 02/15/2009    uses cpap machine   . Thallium stress test abnormal 03/20/2004    Partially transient, mod basal & mid anterior defect most c/w artifact; mild anterior ischemia less likely.  Neg EKG on pharm stress test.   . Thyroid disease     hypothyroidism       Past Surgical History:        Procedure Laterality Date   . COLONOSCOPY N/A 04/25/2020    SCREENING COLONOSCOPY performed by Sloan Leiter, MD at [place]   . COLONOSCOPY  02-24-12    normal (Dr. Angelique Holm)   . COLONOSCOPY FLX DX W/COLLJ SPEC WHEN PFRMD  02-2007    +polyp(tubular adenoma); Dr Angelique Holm   . HERNIA REPAIR      3x   . HYSTERECTOMY (CERVIX STATUS UNKNOWN)     . NEUROLOGICAL SURGERY  03-16-2009    s/p ACD & Fusion (Dr.P.Gurtner)   . TUBAL LIGATION     . UPPER GASTROINTESTINAL ENDOSCOPY N/A 06/12/2022    ENTEROSCOPY PUSH DIAGNOSTIC w/small intestinal bipolar cautery of multiple AVMs performed by Sloan Leiter, MD at [facility] ENDOSCOPY       Social History:    Social History     Tobacco Use   .  Smoking status: Former     Current packs/day: 0.00     Average packs/day: 1 pack/day for 27.0 years (27.0 ttl pk-yrs)     Types: Cigarettes     Start date: 10/16/1977     Quit date: 04/25/2004     Years since quitting: 18.7     Passive exposure: Never   . Smokeless tobacco: Never   Substance Use Topics   . Alcohol use: No                                Counseling given: Not Answered      Vital Signs (Current):   Vitals:  12/02/22 0826 01/08/23 0640   BP:  (!) 171/75   Pulse:  74   Resp:  20   Temp:  98.1 F (36.7 C)   TempSrc:  Oral   SpO2:  98%   Weight: 70.3 kg (155 lb) 69.5 kg (153 lb 3.2 oz)   Height: 1.575 m ('5\' 2"'$ )                                               BP Readings from Last 3 Encounters:   01/08/23 (!) 171/75   01/04/23 126/60   12/16/22 (!) 149/76       NPO Status:                                                                                 BMI:   Wt Readings from Last 3 Encounters:   01/08/23 69.5 kg (153 lb 3.2 oz)   01/04/23 67.9 kg (149 lb 9.6 oz)   12/16/22 68.5 kg (151 lb)     Body mass index is 28.02 kg/m.    CBC:   Lab Results   Component Value Date/Time    WBC 3.7 12/02/2022 09:35 AM    RBC 4.00 12/02/2022 09:35 AM    HGB 9.7 12/02/2022 09:35 AM    HCT 31.6 12/02/2022 09:35 AM    MCV 79.0 12/02/2022 09:35 AM    RDW 19.7 12/02/2022 09:35 AM    PLT 355 12/02/2022 09:35 AM       CMP:   Lab Results   Component Value Date/Time    NA 142 12/02/2022 09:35 AM    K 4.0 12/02/2022 09:35 AM    CL 106 12/02/2022 09:35 AM    CO2 33 12/02/2022 09:35 AM    BUN 5 12/02/2022 09:35 AM    CREATININE 0.54 12/02/2022 09:35 AM    GFRAA >60 05/26/2021 08:05 AM    AGRATIO 1.2 12/02/2022 09:35 AM    AGRATIO 1.4 09/29/2022 12:00 AM    LABGLOM >60 12/02/2022 09:35 AM    LABGLOM 97 09/29/2022 12:00 AM    GLUCOSE 82 12/02/2022 09:35 AM    GLUCOSE 95 09/29/2022 12:00 AM    PROT 6.3 12/02/2022 09:35 AM    CALCIUM 9.4 12/02/2022 09:35 AM    BILITOT 0.6 12/02/2022 09:35 AM    ALKPHOS 79 12/02/2022 09:35 AM    ALKPHOS  91 09/29/2022 12:00 AM    AST 12 12/02/2022 09:35 AM    ALT 22 12/02/2022 09:35 AM       POC Tests: No results for input(s): "POCGLU", "POCNA", "POCK", "POCCL", "POCBUN", "POCHEMO", "POCHCT" in the last 72 hours.    Coags:   Lab Results   Component Value Date/Time    PROTIME 12.9 12/02/2022 09:35 AM    INR 1.0 12/02/2022 09:35 AM    APTT 30.9 12/02/2022 09:35 AM       HCG (If Applicable): No results found for: "PREGTESTUR", "PREGSERUM", "HCG", "HCGQUANT"     ABGs: No results found for: "PHART", "PO2ART", "PCO2ART", "HCO3ART", "BEART", "O2SATART"     Type &  Screen (If Applicable):  No results found for: "LABABO", "[place]"    Drug/Infectious Status (If Applicable):  Lab Results   Component Value Date/Time    HEPCAB <0.1 11/19/2015 12:00 AM       COVID-19 Screening (If Applicable):   Lab Results   Component Value Date/Time    COVID19 Not Detected 11/30/2019 12:00 AM           Anesthesia Evaluation    Airway: Mallampati: III          Dental:    (+) lower dentures and upper dentures      Pulmonary:normal exam    (+)   shortness of breath: no interval change,   sleep apnea: on CPAP,       asthma:                            Cardiovascular:    (+) hypertension:, CHF: diastolic and no interval change, DOE: after ambulating 1 flight of stairs      ECG reviewed  Rhythm: regular    Echocardiogram reviewed                  Neuro/Psych:   (+) neuromuscular disease:, headaches:            GI/Hepatic/Renal:   (+) GERD: well controlled          Endo/Other:    (+) hypothyroidism::..                 Abdominal: normal exam            Vascular:          Other Findings:       Anesthesia Plan      general     ASA 3       Induction: intravenous.      Anesthetic plan and risks discussed with patient.                    Jonette Pesa, MD   01/08/2023

## 2023-01-08 NOTE — Progress Notes (Signed)
OCCUPATIONAL THERAPY EVALUATION/DISCHARGE    Patient: Elaine [name] (72 y.o. female)  Date: 01/08/2023  Primary Diagnosis: Osteoarthritis of left hip, unspecified osteoarthritis type [M16.12]  Status post left hip replacement [Z96.642]  Procedure(s) (LRB):  LEFT HIP TOTAL ARTHROPLASTY LATERAL APPROACH (Left) Day of Surgery   Precautions: Fall Risk, ROM Restrictions, Surgical protocol, Hip Precautions: Posterior hip precautions  PLOF: Patient was independent with basic self care tasks and used no AD for functional mobility PTA.      ASSESSMENT AND RECOMMENDATIONS:  Patient is able to perform basic self care tasks without assistance using AE (dressing stick, sock aid, LHS, LHSH) given after demonstration/practice while seated.  Supervision given for functional standing and transfers.  Hip precautions reviewed and patient verbalized understanding.  Patient has a supportive family at home to assist her prn and all needed DME for home safety. Educated family on putting [facility] over toilet for raised seat with arms and they verbalized understanding.  Patient was left seated in the recliner with all needs met at the end of the session.      Maximum therapeutic gains met at current level of care and patient will be discharged from occupational therapy at this time.    Further Equipment Recommendations for Discharge:  NA    AMPAC: AM-PAC Inpatient Daily Activity Raw Score: 21    At this time and based on an AM-PAC score, no further OT is recommended upon discharge.  Recommend patient returns to prior setting with prior services.    This AMPAC score should be considered in conjunction with interdisciplinary team recommendations to determine the most appropriate discharge setting. Patient's social support, diagnosis, medical stability, and prior level of function should also be taken into consideration.     SUBJECTIVE:   Patient stated "I know I'm not supposed to bend over."    OBJECTIVE DATA SUMMARY:     Past Medical History:    Diagnosis Date    Anemia     Asthma     Colon polyp     DDD (degenerative disc disease) 02/15/2009    Depression 12/18/2011    DVT (deep venous thrombosis) ([place]) 10/23/2009    Left leg:  No DVT.      Essential hypertension, benign     History of blood transfusion     History of echocardiogram 03/20/2004    EF 70%.  Borderline DDfx.  No significant valvular pathology.    Hypercholesterolemia     Hypertension     Iron deficiency anemia 1/61/0960    Lichen planus 4/54/0981    Meralgia paraesthetica 10/22/2013    OA (osteoarthritis of spine) 02/15/2009    Obesity, unspecified     Other and unspecified hyperlipidemia     Pre-operative cardiovascular examination     For spine surgery    Right sided sciatica     Shortness of breath     Possible asthma, HCVD; less likely CAD (Noted 03/15/09)    Sleep apnea 02/15/2009    uses cpap machine    Thallium stress test abnormal 03/20/2004    Partially transient, mod basal & mid anterior defect most c/w artifact; mild anterior ischemia less likely.  Neg EKG on pharm stress test.    Thyroid disease     hypothyroidism     Past Surgical History:   Procedure Laterality Date    COLONOSCOPY N/A 04/25/2020    SCREENING COLONOSCOPY performed by Sloan Leiter, MD at [place]  02-24-12  normal (Dr. Angelique Holm)    COLONOSCOPY FLX DX W/COLLJ [facility] WHEN PFRMD  02-2007    +polyp(tubular adenoma); Dr Angelique Holm    HERNIA REPAIR      3x    HYSTERECTOMY ([place])      NEUROLOGICAL SURGERY  03-16-2009    s/p ACD & Fusion (Dr.P.Gurtner)    TUBAL LIGATION      UPPER GASTROINTESTINAL ENDOSCOPY N/A 06/12/2022    ENTEROSCOPY PUSH DIAGNOSTIC w/small intestinal bipolar cautery of multiple AVMs performed by Sloan Leiter, MD at [place] Situation:   Social/Functional History  Lives With: Spouse  Type of Home: House  Home Layout: Two level, Able to Live on Main level with bedroom/bathroom  Home Access: Stairs to enter with rails  Entrance Stairs - Number of Steps:  2  Entrance Stairs - Rails: Both  Bathroom Shower/Tub: Lawyer: Engineer, site: Environmental consultant, rolling  Has the patient had two or more falls in the past year or any fall with injury in the past year?: No  Receives Help From: Family  ADL Assistance: Independent  Homemaking Assistance: Independent  Ambulation Assistance: Independent  Transfer Assistance: Teacher, English as a foreign language: Yes  Mode of Transportation: Musician  Occupation: Full time employment  Type of Occupation: Copy  '[x]'$   Right hand dominant   '[]'$   Left hand dominant    Cognitive/Behavioral Status:  Orientation  Overall Orientation Status: Within Normal Limits  Orientation Level: Oriented X4  Cognition  Overall Cognitive Status: WNL    Skin: Intact on UEs  Edema: None noted in UEs    Vision/Perceptual:    Vision  Vision: Impaired  Vision Exceptions: Wears glasses at all times       Coordination: BUE  Coordination: Within functional limits    Balance:  Balance  Sitting: Intact  Standing: With support  Standing - Static: Good  Standing - Dynamic: Good    Strength: BUE  Strength: Within functional limits    Tone & Sensation: BUE  Tone: Normal  Sensation: Intact    Range of Motion: BUE  AROM: Within functional limits    Functional Mobility and Transfers for ADLs:  Bed Mobility:  Bed Mobility Training  Bed Mobility Training: No  Transfers:  Pharmacologist: Yes  Sit to Stand: Modified independent  Stand to Sit: Modified independent  Stand Pivot Transfers: Modified independent  Toilet Transfer: Supervision    ADL Assessment:   Feeding: Independent  Grooming: Independent  UE Bathing: Modified independent   LE Bathing: Supervision  UE Dressing: Modified independent   LE Dressing: Supervision  Toileting: Supervision    ADL Intervention:  Patient practiced LB dressing with use of AE given (dressing stick, sock aid) after demonstration.  No assist needed after practice but extra time needed for all task  completion.  Patient also given a long handled sponge and long handled shoe horn after demonstration.     Pain:  Pain level pre-treatment: 0/10   Pain level post-treatment: 0/10   Pain Intervention(s): Rest, Ice  Response to intervention: Nurse notified    Activity Tolerance:   Activity Tolerance: Patient Tolerated treatment well  Please refer to the flowsheet for vital signs taken during this treatment.    After treatment:   '[x]'$  Patient left in no apparent distress sitting up in chair  '[]'$  Patient left in no apparent distress in bed  '[x]'$  Call bell left within reach  [  x] Nursing notified  '[x]'$  Caregiver (family) present  '[]'$  Bed alarm activated    COMMUNICATION/EDUCATION:   Patient Education  Education Given To: Patient;Family  Education Provided: Role of Therapy;Plan of Care;Precautions;Equipment;ADL Systems developer;Fall Prevention Strategies  Education Method: Demonstration;Verbal;Teach Back  Barriers to Learning: None  Education Outcome: Verbalized understanding;Demonstrated understanding    Thank you for this referral.  Norman Clay, MS OTR/L   Minutes: 26    Eval Complexity: Decision Making: Low Complexity

## 2023-01-08 NOTE — Op Note (Signed)
Operative Note      Patient: Elaine [name]  Date of Birth: 1951-10-03  MRN: [id number]    Date of Procedure: 01-30-23    Pre-Op Diagnosis Codes:     * Osteoarthritis of left hip, unspecified osteoarthritis type [M16.12]    Post-Op Diagnosis: Same       Procedure(s):  LEFT HIP TOTAL ARTHROPLASTY LATERAL APPROACH    Surgeon(s):  Heloise Beecham, DO    Assistant:   Surgical Assistant: Toni Amend  Physician Assistant: Clerance Lav, PA-C    Anesthesia: General    Estimated Blood Loss (mL): 300     Complications: None    Specimens:   * No specimens in log *    Implants:  Implant Name Type Inv. Item Serial No. Manufacturer Lot No. LRB No. Used Action   SHELL ACET SOLIDBACK E 52 MM TRIDENT II TRIATHLON - [id number]  SHELL ACET SOLIDBACK E 52 MM TRIDENT II TRIATHLON  [employer] HOW[employer]WD [id number] Left 1 Implanted   LINER ACET SZ E ID42MM HIP X3 CO CHROM CEMENTLESS MOD 2 - [id number]  LINER ACET SZ E ID42MM HIP X3 CO CHROM CEMENTLESS MOD 2  [employer]WD [id number] Left 1 Implanted   STEM FEM HI OFFSET 5 HIP CLLRD INSIGNIA - [id number]  STEM FEM HI OFFSET 5 HIP CLLRD INSIGNIA  [employer] HOWM[employer]WD [id number] Left 1 Implanted   INSERT ACET 48X28 MM HIP X3 ADM/MDM REST - [id number]  INSERT ACET 48X28 MM HIP X3 ADM/MDM REST  [employer]WD [id number] Left 1 Implanted   HEAD FEM DIA28MM +4MM OFFSET HIP BIOLOX DELT CERAMIC TAPR - [id number]  HEAD FEM DIA28MM +4MM OFFSET HIP BIOLOX DELT CERAMIC TAPR  [employer]WD [id number] Left 1 Implanted         Drains: * No LDAs found *    Findings: see below    Elaine [name], PAC was present for the procedure and vital for the performance of the procedure. Clerance Lav, PAC assisted with positioning, prepping, draping of the patient before the procedure and instrument manipulation/placement, spinal repositioning and navigation/robotics/flouroscopic operation during the procedure as well as wound closure, dressing application  and brace application after the procedure. Please note that no intern, resident, or other hospital staff was available to replace her as assist during the surgery      OR Staff:  Circulator: Marni Griffon, RN  Surgical Assistant: Toni Amend  Relief Circulator: Melodye Ped, RN  Scrub Person First: Dereck Ligas Person Second: Roger Kill  Physician Assistant: Clerance Lav, PA-C       INDICATIONS FOR PROCEDURE: 72yo female who has failed conservative management of their hip arthritis to include activity modification, oral medication. The disease has caused significant impact on their quality of life and wished to undergo surgical management understanding the risk of the surgery and the benefit to relieve pain and restore function following failure of conservative management.     PROCEDURE:   After informed consent was confirmed in the preoperative holding area, the operative extremity was marked. The patient was brought to the operating room and anesthesia was initiated. The anesthesia team maintained controlled of the head, neck and airway throughout the procedure. The patient was placed in the lateral decubitus position with the operative extremity up and all bony prominences were padded and mechanical dvt prophylaxis was placed on the nonoperative limb. The operative leg was prepped and draped in the usual sterile fashion. A time-out was done to  ensure the right patient, side, and procedure to be performed. The patient received weight-based preoperative antibiotics and received txa.     A standard posterior approach to the hip was performed. Leg lengths were assessed prior to dislocation. The hip was dislocated and the neck was cut at the planned resection level from the lesser trochanter to recreate the desired length and offset based off preoperative templating. The acetabulum was exposed and soft tissue from the joint was removed. After identifying the teardrop, sequential reaming  produced a pinch with a bleeding bony surface. After a trial cup was found to be stable, the final cup and trial liner were placed. The femoral canal was then exposed with internal rotation, flexion and adduction.  The femoral shoulder was cleared and the femoral canal was instrumented.    Sequential reaming and broaching was performed until axial and rotational stability of the broach was apparent. A trial was placed based on our preoperative templating and intraoperative assessment. An intraoperative radiograph was obtained. The hip was ranged to ensure there was no impingement and that it was stable in extension external rotation, position of sleep, deep flexion and flexion internal rotation to over 45 degrees. Leg lengths were assessed by again measuring our residual neck cut and how the limbs felt comparing this with our preoperative template and intraoperative radiograph.    The final MDM liner and femoral stem were placed. A final head trial was done to confirm stability and leg length. The trunnion was cleaned and the ceramic head ball placed. Leg lengths felt appropriate. A betadine wash was performed. Local infiltrative analgesia was injected into the surrounding tissues. The posterior capsule was repaired with a soft tissue repair consisting of a number 5 ethibond suture and a running #1 vicryl suture. The fascia was closed with #1 vicryl and a running # 2 quill suture. Scarpa's layer was approximated with a #1 Vicryl suture. The dermis was closed with 2-0 monocryl suture followed by a running 2-0 Quill suture in the subcuticular layer. Skin mesh and glue and a Mepilex dressing were applied. At the end of the procedure, all counts were correct times two. The patient was transferred to PACU in stable condition. There were no immediate complications.    Post operatively the patient will be weightbearing as tolerated. The patient will attempt to walk on Post operative day 0 and will have chemoprophylaxis for  dvt prophylaxis along with early mobilization and mechanical prophylaxis while in house. The patient will remain on our postoperative pain regimen to minimize heavy narcotics.    I was present and scrubbed for all key portions of the procedure.                  Electronically signed by Heloise Beecham, DO on 01/08/2023 at 10:05 AM

## 2023-01-08 NOTE — Interval H&P Note (Signed)
Update History & Physical    The patient's History and Physical of January 08, 2023 was reviewed with the patient and I examined the patient. There was no change. The surgical site was confirmed by the patient and me.     Plan: The risks, benefits, expected outcome, and alternative to the recommended procedure have been discussed with the patient. Patient understands and wants to proceed with the procedure.     Electronically signed by Merdis Delay, DO on 01/08/2023 at 7:13 AM

## 2023-01-08 NOTE — Progress Notes (Signed)
Discharge teaching completed at bedside. Clarified  any questions for patient and patient verbalized understanding. IV and drains removed from avatar. IV shredded. Transported via wheelchair transport to front of hospital.

## 2023-01-08 NOTE — H&P (Signed)
Patient: Elaine [name]                MRN: [id number]       SSN: [id number]  Date of Birth: 02-19-1951        AGE: 72 y.o.        SEX: female     PCP: Marvia Pickles, MD  12/08/22     Chief Complaint: H&P (LEFT THA on 12/23/22)        1. Primary osteoarthritis of left hip  Assessment & Plan:  Planned surgery:LEFT THA, LATERAL approach  Surgery date: 12/23/22  Clearance obtained: yes, PCP (12/03/22), awaiting hematology clearance  Required imaging complete: yes, left hip and sit/ stand of spine (change in SS = 21.2)     ALL: aspirin (GI distress); diphenhydramine (msk spasms/ jerking)     Labs:   Hemoglobin: 9.7  Platelets: 355  Creatinine: 0.54  GFR >60  Albumin: 3.4  Hgb A1C: 5.7  Nicotine: NEG   UDS: NEG     PMH: HTN, Chronic diastolic HF, hx of GI AVMs/ Bleed, Hypothyroidism, Asthma, RIGHT renal atrophy, iron deficiency anemia, sleep apnea     MEDS: amlodipine, atorvastatin, synthroid, potassium chloride, valsartan, iron injections, montelukast, albuterol, ellipta.     Surgery was discussed with the patient today.  They have failed conservative management of their pathology. The risks and benefits of surgical and conservative (nonsurgical) treatment were discussed at length.  The risks of surgery include but are not limited to pain, scar, infection, painful hardware, hardware failure, fracture, instability, weakness, stiffness, Deep Veinous Thrombosis/Pulmonary Embolism, anesthetic risks including heart attack/stroke, injury to nerves and/or blood vessels, bleeding, the need for further surgery and death.  In the case of fracture repair, the risks also include nonunion or malunion. The recovery from surgery was also discussed at length.  All of the patient's questions were entertained and answered.  Understanding the diagnosis, risks and benefits of surgical treatment, the patient wishes to proceed with surgery.     Orders:  -     AMB POC X-RAY RADEX HIP UNI WITH PELVIS 2-3 VIEWS  -     AMB POC XRAY,  SPINE, LUMBOSACRAL; 2 O  2. Pre-diabetes  3. Gastrointestinal hemorrhage, unspecified gastrointestinal hemorrhage type  4. Chronic diastolic congestive heart failure (HCC)  5. Hypothyroidism, unspecified type  6. Iron deficiency anemia, unspecified iron deficiency anemia type  7. HTN (hypertension), benign  8. Asthma, unspecified asthma severity, unspecified whether complicated, unspecified whether persistent     HPI:  Elaine [name] is a 72 y.o. female with chief complaint of        Chief Complaint   Patient presents with    Hip Pain       Lt       We have been planning on a left total hip arthroplasty through the anterolateral approach due to some pain in the lateral hip as well as a overhanging pannus.  She recently, in July, had a GI bleed and has had a cascade of medical issues since then.  She is hoping that she can have her hip replaced at the beginning of the year.          06/22/2022     1:33 PM   AMB PAIN ASSESSMENT   Location of Pain Hip   Location Modifiers Left   Severity of Pain 0   Quality of Pain Aching   Duration of Pain Persistent   Frequency of Pain Intermittent   Aggravating  Factors Standing;Walking;Stairs   Limiting Behavior Yes   Relieving Factors Other (Comment)   Result of Injury No   Work-Related Injury No   Are there other pain locations you wish to document? No           Tobacco Use: Medium Risk (12/08/2022)     Patient History      Smoking Tobacco Use: Former      Smokeless Tobacco Use: Never      Passive Exposure: Never      IMAGING:  Imaging read by myself and interpreted as follows:     Jan 08, 2023:  Pt seen in preop and plan for left THA through lateral approach. All questions answered.     Dec 08, 2022:  3 view x-ray of the left hip including AP pelvis, and AP and lateral demonstrating severe joint space narrowing with evidence of osteophytosis, subchondral sclerosis, and subchondral cyst formation.      2 view x-ray of the lumbar spine, sitting and standing, demonstrates facet joint  arthropathy and decreased disc space at multiple levels. The change in SS is 21.2.      September 25, 2022:  3 view x-ray of the left hip including AP pelvis and AP and crosstable lateral of the left hip demonstrate significant joint space loss acetabular joint of approximately 90%.  There is osteophytosis,  subchondral sclerosis and subchondral cyst formation.        PHYSICAL EXAMINATION:  BP 132/60 (Site: Right Upper Arm, Position: Sitting)   Ht 1.575 m (5\' 2" )   Wt 68 kg (150 lb)   BMI 27.44 kg/m   Body mass index is 27.44 kg/m.      Wt Readings from Last 3 Encounters:   12/08/22 68 kg (150 lb)   12/03/22 70.8 kg (156 lb)   12/02/22 70.8 kg (156 lb)         GENERAL: Alert and oriented x3, in no acute distress.  HEENT: Normocephalic, atraumatic.    MSK: Left Hip Exam      Tenderness   The patient is experiencing tenderness in the lateral.     Range of Motion   Flexion:  100   External rotation:  30   Internal rotation: 10      Muscle Strength   Abduction: 5/5   Adduction: 5/5   Flexion: 5/5      Other   Erythema: absent  Scars: absent  Sensation: normal  Pulse: present     Comments:  Skin over the left hip is clean dry and intact.  There is a large overhanging pannus which overlies the incision site for a direct anterior approach.  Has difficulty maintaining single-leg stance on the left with positive Trendelenburg sign.            Assessment and plan:     December 08, 2022:  Primary osteoarthritis of the left hip. Pt is here for her H&P appointment prior to her 12/23/22 THA. 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 She has been cleared by her PCP for surgery and will be following up with her hematologist prior to her surgery for clearance due to her chronic iron deficiency anemia and low hemoglobin numbers on her last labs. They are out of the [facility] network, so they will be faxing that  over once completed. She receives iron infusions every other month and had her  last one in November.      She has allergies to aspirin and diphenhydramine. Due to this she will get lovenox 30 mg as DVT prophylaxis. Due to her history of GI bleed, we will not prescribe meloxiam. I will order her post operative medications once she receives clearance from hematology.      September 25, 2022:  We will continue to plan on doing a left total hip arthroplasty through an anterolateral approach.  Again addressed the benefits and risks of anterolateral versus direct anterior the patient would like to continue the anterior lateral plan.  She does need to improve her health and get stabilized with recent GI bleed.  We will hope to move forward after the first of the year.     Apr 20, 2022:  She failed conservative treatment with Tylenol, ibuprofen and gabapentin.  She did get a left hip corticosteroid injection intra-articularly which gave her only a month of greater than 90% relief of her pain.  At this point would like to go forward with a left total hip replacement through the anterolateral approach.  I did discuss with her the risks and benefits of a direct anterior versus anterolateral approach and she did agree to go forward with anterolateral approach.  She has an overhanging pannus that reaches down the thigh quite a bit.  She is currently working on adjusting her thyroid medications to get her hypothyroidism under control.  Her other medical comorbidities under medication control at this time.  She is currently working in childcare and would like to go forward with surgery in July as her last day of work is the end of June.             Past Medical History:   Diagnosis Date    Anemia      Asthma      Colon polyp      DDD (degenerative disc disease) 02/15/2009    Depression 12/18/2011    DVT (deep venous thrombosis) (HCC) 10/23/2009     Left leg:  No DVT.      Essential hypertension, benign      History of blood  transfusion      History of echocardiogram 03/20/2004     EF 70%.  Borderline DDfx.  No significant valvular pathology.    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  Neg EKG on pharm stress test.    Thyroid disease       hypothyroidism               Family History   Problem Relation Age of Onset    Hypertension Sister           x3  Hypertension Mother      Cancer Mother      Heart Surgery Sister      Heart Disease Sister      Hypertension Paternal Uncle      Cancer Maternal Aunt           breast    Breast Cancer Maternal Aunt      Glaucoma Son      Heart Defect Son      Diabetes Paternal Uncle      Heart Attack Sister                  Current Outpatient Medications   Medication Sig Dispense Refill    amLODIPine (NORVASC) 5 MG tablet Take 1 tablet by mouth daily 30 tablet 3    Misc Natural Products (NEURIVA PO) Take 1 tablet by mouth daily        NONFORMULARY Viviscal -1 tablet twice a day for hair and scalp        gabapentin (NEURONTIN) 300 MG capsule Take 1 capsule by mouth 3 times daily for 180 days. Max Daily Amount: 900 mg 270 capsule 1    potassium chloride (KLOR-CON M) 10 MEQ extended release tablet TAKE 1 TABLET EVERY DAY 90 tablet 3    valsartan (DIOVAN) 320 MG tablet Take 1 tablet by mouth daily 90 tablet 3    levothyroxine (SYNTHROID) 100 MCG tablet Take 1 tablet by mouth daily 90  tablet 1    atorvastatin (LIPITOR) 40 MG tablet TAKE 1 TABLET EVERY DAY (NEED MD APPOINTMENT) 90 tablet 3    montelukast (SINGULAIR) 10 MG tablet          albuterol sulfate HFA (PROVENTIL;VENTOLIN;PROAIR) 108 (90 Base) MCG/ACT inhaler Inhale 2 puffs into the lungs every 4 hours as needed for Wheezing or Shortness of Breath 18 g 5    cyanocobalamin 1000 MCG tablet Take 2.5 tablets by mouth daily        ZINC PO Take 1 tablet by mouth daily        vitamin D3 (CHOLECALCIFEROL) 125 MCG (5000 UT) TABS tablet Take 1 tablet by mouth daily        iron dextran complex (INFED) 50 MG/ML injection Infuse 19.5 mLs intravenously Pt gets these every other month        latanoprost (XALATAN) 0.005 % ophthalmic solution Place 1 drop into both eyes nightly        lidocaine (LIDODERM) 5 % Place 1 patch onto the skin every 24 hours        pantoprazole (PROTONIX) 40 MG tablet Take 1 tablet by mouth daily          No current facility-administered medications for this visit.               Allergies   Allergen Reactions    Aspirin Other (See Comments)       GI distress,     Diphenhydramine Other (See Comments)       Muscle jerking    Pollen Extract         Other reaction(s): Not Reported This Time               Past Surgical History:   Procedure Laterality Date    COLONOSCOPY N/A 04/25/2020     SCREENING COLONOSCOPY performed by Raynelle Jan, MD at [facility] END[facility]COPY    COLONOSCOPY   02-24-12     normal (Dr. Adelene Idler)    COLONOSCOPY FLX DX W/COLLJ SPEC WHEN PFRMD  02-2007     +polyp(tubular adenoma); Dr Adelene Idler    HERNIA REPAIR         3x    HYSTERECTOMY (CERVIX STATUS UNKNOWN)        NEUROLOGICAL SURGERY   03-16-2009     s/p ACD & Fusion (Dr.P.Gurtner)    TUBAL LIGATION        UPPER GASTROINTESTINAL ENDOSCOPY N/A 06/12/2022     ENTEROSCOPY PUSH DIAGNOSTIC w/small intestinal bipolar cautery of multiple AVMs performed by Raynelle Jan, MD at [facility] [place] ENDOSCOPY         Social History            Socioeconomic History    Marital status: Divorced       Spouse  name: Not on file    Number of children: Not on file    Years of education: Not on file    Highest education level: Not on file   Occupational History    Not on file   Tobacco Use    Smoking status: Former       Current packs/day: 0.00       Average packs/day: 1 pack/day for 27.0 years (27.0 ttl pk-yrs)       Types: Cigarettes       Start date: 10/16/1977       Quit date: 04/25/2004       Years since quitting: 18.6       Passive exposure: Never    Smokeless tobacco: Never   Vaping Use    Vaping Use: Never used   Substance and Sexual Activity    Alcohol use: No    Drug use: Never    Sexual activity: Not Currently   Other Topics Concern    Not on file   Social History Narrative    Not on file      Social Determinants of Health           Financial Resource Strain: Low Risk  (03/30/2022)     Overall Financial Resource Strain (CARDIA)      Difficulty of Paying Living Expenses: Not hard at all   Food Insecurity: Not on file (03/30/2022)   Transportation Needs: Unknown (03/30/2022)     PRAPARE - Nutritional therapist (Medical): Not on file      Lack of Transportation (Non-Medical): No   Physical Activity: Inactive (05/11/2022)     Exercise Vital Sign      Days of Exercise per Week: 0 days      Minutes of Exercise per Session: 0 min   Stress: Not on file   Social Connections: Not on file   Intimate Partner Violence: Not on file   Housing Stability: Unknown (03/30/2022)     Housing Stability Vital Sign      Unable to Pay for Housing in the Last Year: Not on file      Number of Places Lived in the Last Year: Not on file      Unstable Housing in the Last Year: No         REVIEW OF SYSTEMS:       No changes from previous review of systems unless noted.        Prescription medication management discussed with patient.      Electronically signed by: Clerance Lav, PA-C     Note: This note was completed using voice recognition software.  Any typographical/name errors or mistakes are unintentional.

## 2023-01-08 NOTE — Progress Notes (Signed)
Postop check:    Patient seen an examined at bedside. Pain well-controlled. Nausea, vomiting, lightheadedness, or dizziness. Has transferred from the bed to the chair but has not worked with physical therapy.    Physical exam:  Left lower extremity is neurovascular intact Distally. Quadriceps is firing appropriately. Dressing clean dry and intact.    Assessment plan:  Status post left total hip arthroplasty to the poster ior lateral approach.  Patient will work with physical therapy and if clears without problems will go home this afternoon.

## 2023-01-08 NOTE — Other (Signed)
Patient /Family /Designee has been informed that [facility] is not responsible for patient belongings per policy and the signed [facility] Patient Agreement document.  Personal items should be sent home or checked in with security.  Patient /Family /Designee selected the following action:                            [x]  Send personal items home with a family member or friend                                                 []  Check in personal items with security, excluding clothing                            []  Maintain personal items at the bedside, against recommendation                                 by [facility]                                   ** If patient /family /designee chooses to maintain personal items at the bedside,                                      Complete the patient belongings inventory in the EMR.

## 2023-01-11 ENCOUNTER — Encounter

## 2023-01-11 MED ORDER — APIXABAN 2.5 MG PO TABS
2.5 | ORAL_TABLET | Freq: Two times a day (BID) | ORAL | 0 refills | Status: DC
Start: 2023-01-11 — End: 2023-03-01

## 2023-01-11 NOTE — Telephone Encounter (Signed)
Attempted to reach patient regarding postoperative follow up. VM is full so unable to leave a message.

## 2023-01-11 NOTE — Care Coordination-Inpatient (Signed)
Care Transitions Note    Initial Call - Call within 2 business days of discharge: Yes     Patient Current Location:  Home: [place] [id number]    Care Transition Nurse contacted the patient, family, ([place] form verified; on file) by telephone to perform post hospital discharge assessment, verified name and DOB as identifiers. Provided introduction to self, and explanation of the Care Transition Nurse role.     Patient: Elaine [name]    Patient DOB: 11-07-1951   MRN: [id number]    Reason for Admission: Left Total Hip Replacement  Discharge Date: 01/08/23  RURS: Readmission Risk              Risk of Unplanned Readmission:  0          Last Discharge Facility       Date Complaint Diagnosis Description Type Department Provider    01/08/23  Status post left hip replacement Admission (Discharged) [id number], Viona Gilmore, DO            Was this an external facility discharge? No    Challenges to be reviewed by the provider   Additional needs identified to be addressed with provider: Yes    Patient/Daughter requesting to speak to surgeon's office re: issues noted below and will call today.    medications-Patient has not started taking Lovenox injections due to stating she and family did not receive any education/instructions on what it's for and how to administer. CTN reviewed importance of taking for prevention of blood clots, but they declined administration instructions from CTN and instead prefer to speak to surgeon's office.    Pain-Family wants to discuss pain level which is quite high at times and higher than patient anticipated. Patient states it is adequately controlled for the most part. Taking Tylenol as scheduled 2 tabs TID, Tramadol every 6 hrs, and oxycodone sometimes in between Tramadol doses and trying to use oxycodone sparingly. Applying ice to hip every 2-3 hrs. Getting up to walk every 1-1.5 hrs.           Method of communication with provider: chart routing.    Patients top risk  factors for readmission: functional physical ability, medical condition-s/p L THR, and medication management    Interventions to address risk factors: Obtained and reviewed discharge summary and/or continuity of care documents, Education of patient/family/caregiver/guardian to support self-management-CTN reviewed Lovenox injection reason for taking and importance for prevention of blood clots. Patient/family declined administration education due to preferring to contact surgeon's office and discuss with them. CTN is routing update to surgeon., and Assessment and support for treatment adherence and medication management-CTN confirmed patient has all Rxs, understands reason for taking, and family confirmed understanding of post-op red flags and prevention of complications. Family is very supportive in assisting patient with medication management, mobility, ICS use, applying ice, and monitoring patient for red flags. Family plans to discuss concerns re: elevated pain with surgeon office when they call today.    Care Summary Note: Patient reports she's doing okay, but pain level has been higher than anticipated. Pain level ranges 6/10 up to 9/10. Taking Tramadol first for pain relief, and then oxycodone for breakthrough pain, but less often than Tramadol and only when unbearable. Confirmed she's taking Tylenol 1000 mg TID as scheduled. Applying ice to hip every 2-3 hrs. Denies fevers, redness, swelling, drainage at surgical site/around drsng. Left hip drsng is CDI. Getting up to walk every 1-1.5 hrs. Using ICS and doing  foot exercises every hour. Wearing compression stockings. Daughter and patient report nobody reviewed Lovenox injections with patient/family and they will call surgeon's office today.       Care Transition Nurse reviewed discharge instructions, medical action plan, and red flags with patient and family. The patient and family was given an opportunity to ask questions;  family prefers to Dentist  office to review Lovenox and CTN is chart routing update to surgeon as well . The patient and family needs reinforcement of information discussed.  and will call surgeon's office to review Lovenox instructions.  Were discharge instructions available to patient? Yes.   Reviewed appropriate site of care based on symptoms and resources available to patient including: PCP  Specialist  When to call 911  CTN . The patient and family agrees to contact the primary care provider and/or specialist office for questions related to their healthcare.      Advance Care Planning:   Does patient have an Advance Directive: not on file .    Medication Reconciliation:  Medication reconciliation was performed with patient and family,[id number] entered: yes.    Remote Patient Monitoring:  Offered patient enrollment in the Remote Patient Monitoring (RPM) program for in-home monitoring:  Did not assess eligibility at this time .    Assessments:  Care Transitions 24 Hour Call    Do you have a copy of your discharge instructions?: Yes  Do you have all of your prescriptions and are they filled?: Yes  Have you been contacted by a [place]?: No  Have you scheduled your follow up appointment?: Yes  How are you going to get to your appointment?: Car - family or friend to transport  Do you feel like you have everything you need to keep you well at home?: Yes  Care Transitions Interventions  No Identified Needs          Follow Up Appointment:   Discussed follow up appointments. Patient has hospital follow up appointment scheduled within 10-14 days of discharge.   Future Appointments         Provider Specialty Dept Phone    01/18/2023 9:30 AM Mor[facility] Bell[facility] Orthopedic Surgery [phone]    02/15/2023 11:00 AM [facility] Internal Medicine [phone]    03/01/2023 8:20 AM Thana Farr, MD Internal Medicine [phone]    09/16/2023 10:20 AM Lessie Dings, PA-C Urology [phone]            Care Transition Nurse provided contact  information.  Plan for follow-up call in 3-5 days based on severity of symptoms and risk factors.  Plan for next call: symptom management-assess for improvement of pain control  medication management-confirm understanding and initiation of Lovenox injections    Olam Idler, RN

## 2023-01-11 NOTE — Telephone Encounter (Signed)
Patients daughter, Kathlen Brunswick (patient is staying with her post surgery), has questions regarding patients current pain level (4/10 in bed) and also what to do with the needles they were given for blood clots.  Please contact patient and Britani back at [phone] or [phone].

## 2023-01-11 NOTE — Progress Notes (Signed)
The patient was contacted at the request of her home health nurse.  The patient did not feel comfortable taking the injections for her Lovenox so after discussion we decided to put in a prescription for Eliquis.  She has been able to walk on an hourly basis consistently and this helps with her pain.  Her pain actually increases a little bit when she is sitting down.

## 2023-01-12 ENCOUNTER — Encounter

## 2023-01-12 MED ORDER — HYDROXYZINE HCL 25 MG PO TABS
25 | ORAL_TABLET | Freq: Three times a day (TID) | ORAL | 0 refills | Status: AC | PRN
Start: 2023-01-12 — End: 2023-01-19

## 2023-01-12 NOTE — Telephone Encounter (Signed)
Patient Daughter is requesting a call back about previous message       [phone]

## 2023-01-12 NOTE — Progress Notes (Signed)
Patient's daughter called stating that her mom is allergic to the oxycodone that was prescribed post operatively. Daughter states that her mom began having severe itching after taking it and it has improved since stopping the medication. Pt is having no issues with breathing or swelling. The patient is also allergic to benadryl, therefore I sent in a prescription for hydroxyzine today to help with the itching.     The daughter was instructed to discard the remaining oxycodone at their pharmacy. Patient is currently taking tramadol and tylenol for her pain and states this is helping. She has her first follow up appointment on 01/18/23.

## 2023-01-13 NOTE — Telephone Encounter (Signed)
Elaine [name] is requesting a call back or an appointment for today or tomorrow because the patient is breaking out in hives and she is allergic to Benadryl and she stop her form taking the oxycodone because they was thinking that caused the patient to break out in hives.    Patient had surgery on 01/08/23    Please call Britani at [phone]

## 2023-01-13 NOTE — Care Coordination-Inpatient (Signed)
Care Transitions Note    Follow Up Call      Patient Current Location:  [facility] Transition Nurse contacted the family, ([place] form verified; on file) by telephone to follow up after discharge on 01/08/23 for  Left Total Hip Replacement. Verified name and DOB as identifiers.    Challenges to be reviewed by the provider   Additional needs identified to be addressed with provider: No  none               Method of communication with provider: none.    Care Summary Note: Daughter reports she spoke to surgeon's PA today re: the following concerns. Patient developed hives and itching believed to be r/t oxycodone and has been prescribed hydroxyzine. Her right leg gave out and family was advised to monitor this, as this sometimes occurs after surgery due to the muscles needing to get stronger. She had a temp of 101.0 and no higher. Daughter thought one leg felt hotter than the other, but her husband felt like legs felt the same temp. Lovenox injections were discontinued and patient was started on Eliquis, which family has picked up. All concerns were addressed with the PA. The patient is current sleeping after taking hydroxyzine. Family will wake her up after 2 hours if she's still asleep and get her up to walk.    Addressed changes since last contact:  medications - Eliquis prescribed to take the place of Lovenox and hydroxyzine prescribed for hives/itching.       Medication Review:  Medication review was performed with family. Medications changed since last call, reviewed today.     Remote Patient Monitoring:  Offered patient enrollment in the Remote Patient Monitoring (RPM) program for in-home monitoring:  Did not assess eligibility. Patient likely not a good candidate at this time due to recovering from surgery .    Assessments:  Care Transitions Subsequent and Final Call    Subsequent and Final Calls  Do you have any ongoing symptoms?: Yes  Onset of Patient-reported symptoms: Yesterday  Patient-reported symptoms:  Other  Interventions for patient-reported symptoms: Notified PCP/Physician  Have your medications changed?: Yes  Patient Reports: Eliquis replaced Lovenox and hydroxyzine ordered.  Do you have any questions related to your medications?: No  Do you currently have any active services?: No  Do you have any needs or concerns that I can assist you with?: No  Identified Barriers: None  Care Transitions Interventions  No Identified Needs  Other Interventions:              Follow Up Appointment:   Reviewed upcoming appointment(s). During previous outreach.  Future Appointments         Provider Specialty Dept Phone    01/18/2023 9:30 AM [facility] [phone]    02/15/2023 11:00 AM [facility] Internal Medicine [phone]    03/01/2023 8:20 AM Thana Farr, MD Internal Medicine [phone]    09/16/2023 10:20 AM Lessie Dings, PA-C Urology [phone]            Care Transition Nurse provided contact information.  Plan for follow-up call in 6-10 days based on severity of symptoms and risk factors.  Plan for next call: symptom management-assess for post op red flags and improvement of hives  follow-up appointment-confirm attendance of post op appt  medication management-assess for side effects from [place], RN

## 2023-01-15 ENCOUNTER — Encounter

## 2023-01-15 MED ORDER — TRAMADOL HCL 50 MG PO TABS
50 | ORAL_TABLET | Freq: Four times a day (QID) | ORAL | 0 refills | Status: DC | PRN
Start: 2023-01-15 — End: 2023-01-22

## 2023-01-15 NOTE — Telephone Encounter (Signed)
Patient's daughter Kathlen Brunswick is requesting an update for the traMADol (ULTRAM) 50 MG tablet that was going to be called in today.    [place]    Patient's daughter Janice Coffin: [phone]

## 2023-01-15 NOTE — Progress Notes (Signed)
Tramadol sent to the pharmacy on file.

## 2023-01-15 NOTE — Telephone Encounter (Signed)
Pt can't be contacted called number on file , not in service , called mobile number goes to voicemail.

## 2023-01-15 NOTE — Telephone Encounter (Signed)
Patient's daughter. Britani, called to remind [place] about an order for Tramadol that is supposed to be put in today because the pt is allergic to the other medication that was prescribed.    Please advise when sent. Tel. [phone]

## 2023-01-18 ENCOUNTER — Ambulatory Visit: Admit: 2023-01-18 | Discharge: 2023-01-18 | Payer: MEDICARE | Primary: Internal Medicine

## 2023-01-18 DIAGNOSIS — Z96642 Presence of left artificial hip joint: Secondary | ICD-10-CM

## 2023-01-18 MED ORDER — LIDOCAINE 5 % EX PTCH
5 | MEDICATED_PATCH | Freq: Every day | CUTANEOUS | 0 refills | 30.00000 days | Status: AC
Start: 2023-01-18 — End: 2023-02-17

## 2023-01-18 NOTE — Progress Notes (Signed)
Patient: Elaine [name]                MRN: [id number]       SSN: [id number]  Date of Birth: [birth year]        AGE: 72 y.o.        SEX: female    PCP: Thana Farr, MD  01/18/23    Chief Complaint: Post-Op Check (LEFT HIP TOTAL ARTHROPLASTY)      1. Status post total replacement of left hip  -     AMB POC X-RAY RADEX HIP UNI WITH PELVIS 2-3 VIEWS  -     lidocaine (LIDODERM) 5 %; Place 1 patch onto the skin daily 12 hours on, 12 hours off., Disp-30 patch, R-0Normal  2. Chronic diastolic congestive heart failure ([place])  3. Iron deficiency anemia, unspecified iron deficiency anemia type  4. Sciatica of left side  5. Orthopedic aftercare    HPI:  Elaine [name] is a 72 y.o. female with chief complaint of   Chief Complaint   Patient presents with    Hip Pain     Lt      We have been planning on a left total hip arthroplasty through the anterolateral approach due to some pain in the lateral hip as well as a overhanging pannus.  She recently, in July, had a GI bleed and has had a cascade of medical issues since then.  She is hoping that she can have her hip replaced at the beginning of the year.        06/22/2022     1:33 PM   AMB PAIN ASSESSMENT   Location of Pain Hip   Location Modifiers Left   Severity of Pain 0   Quality of Pain Aching   Duration of Pain Persistent   Frequency of Pain Intermittent   Aggravating Factors Standing;Walking;Stairs   Limiting Behavior Yes   Relieving Factors Other (Comment)   Result of Injury No   Work-Related Injury No   Are there other pain locations you wish to document? No     Tobacco Use: Medium Risk (01/18/2023)    Patient History     Smoking Tobacco Use: Former     Smokeless Tobacco Use: Never     Passive Exposure: Never     IMAGING:  Imaging read by myself and interpreted as follows:    January 18, 2023:  3 view x-ray of the left hip including AP pelvis, AP and lateral, demonstrates a well positioned left total hip arthroplasty without signs of loosening or complication.  Leg lengths appear to be equal.     Dec 08, 2022:  3 view x-ray of the left hip including AP pelvis, and AP and lateral demonstrating severe joint space narrowing with evidence of osteophytosis, subchondral sclerosis, and subchondral cyst formation.     2 view x-ray of the lumbar spine, sitting and standing, demonstrates facet joint arthropathy and decreased disc space at multiple levels. The change in SS is 21.2.     September 25, 2022:  3 view x-ray of the left hip including AP pelvis and AP and crosstable lateral of the left hip demonstrate significant joint space loss acetabular joint of approximately 90%.  There is osteophytosis,  subchondral sclerosis and subchondral cyst formation.      PHYSICAL EXAMINATION:  There were no vitals taken for this visit.  There is no height or weight on file to calculate BMI.  Wt Readings from Last 3 Encounters:  01/08/23 69.5 kg (153 lb 3.5 oz)   01/04/23 67.9 kg (149 lb 9.6 oz)   12/16/22 68.5 kg (151 lb)       GENERAL: Alert and oriented x3, in no acute distress.  HEENT: Normocephalic, atraumatic.    MSK: Left Hip Exam     Tenderness   The patient is experiencing tenderness in the lateral.    Range of Motion   Flexion:  100   External rotation:  30   Internal rotation: 10     Muscle Strength   Abduction: 5/5   Adduction: 5/5   Flexion: 5/5     Other   Erythema: absent  Scars: present  Sensation: normal  Pulse: present    Comments:  Lateral incision, clean, dry, and intact without erythema or drainage. No ttp around incision. She is able to perform a straight leg raise. Mild lateral pain with IR and ER rotation.         ASSESSMENT & PLAN:    January 18, 2023:  10 days s/p left THA, lateral approach. Pt is continuing to improve. Her pain fluctuates based off of her activity level. She was getting up and walking 8-10 steps every hour but this started to increase her pain so they have cut it back to every 2 hours but continue to do exercise while she is seated. She had an allergic  reaction to the oxycodone that was given to her post op and she developed intense itching. She stopped the oxycodone once this started and I prescribed her hydroxyzine due to her allergy to benadryl. The itching has subsided since. She is currently taking tramadol for her pain every 8 hours along with tylenol and reports that this is helping with her pain. She is also taking the eliquis prescribed to her due to her aspirin allergy. She denies calf pain and has no calf pain, swelling, and negative homan's on exam. Her incision is clean, dry, and intact without ttp or erythema. She states that most of her pain is around her incision. She continues to walk with a walker for support. We will have her follow up with Dr. Garner Nash on 4 weeks.     She also asked for lidocaine patches today, which were prescribed, and I will refill her tramadol when it is due this coming Friday.       December 08, 2022:  Primary osteoarthritis of the left hip. Pt is here for her H&P appointment prior to her 12/23/22 THA. 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 She has been cleared by her PCP for surgery and will be following up with her hematologist prior to her surgery for clearance due to her chronic iron deficiency anemia and low hemoglobin numbers on her last labs. They are out of the [facility] network, so they will be faxing that over once completed. She receives iron infusions every other month and had her last one in November.     She has allergies to aspirin and diphenhydramine. Due to this she will get lovenox 30 mg as DVT prophylaxis. Due to her history of GI bleed, we will not prescribe meloxiam. I will order her post operative medications once she receives clearance from hematology.     September 25, 2022:  We will continue to plan on doing a left total hip arthroplasty through an anterolateral approach.  Again  addressed the benefits  and risks of anterolateral versus direct anterior the patient would like to continue the anterior lateral plan.  She does need to improve her health and get stabilized with recent GI bleed.  We will hope to move forward after the first of the year.    Apr 20, 2022:  She failed conservative treatment with Tylenol, ibuprofen and gabapentin.  She did get a left hip corticosteroid injection intra-articularly which gave her only a month of greater than 90% relief of her pain.  At this point would like to go forward with a left total hip replacement through the anterolateral approach.  I did discuss with her the risks and benefits of a direct anterior versus anterolateral approach and she did agree to go forward with anterolateral approach.  She has an overhanging pannus that reaches down the thigh quite a bit.  She is currently working on adjusting her thyroid medications to get her hypothyroidism under control.  Her other medical comorbidities under medication control at this time.  She is currently working in childcare and would like to go forward with surgery in July as her last day of work is the end of June.      Past Medical History:   Diagnosis Date    Anemia     Asthma     Colon polyp     DDD (degenerative disc disease) 02/15/2009    Depression 12/18/2011    DVT (deep venous thrombosis) ([place]) 10/23/2009    Left leg:  No DVT.      Essential hypertension, benign     History of blood transfusion     History of echocardiogram 03/20/2004    EF 70%.  Borderline DDfx.  No significant valvular pathology.    Hypercholesterolemia     Hypertension     Iron deficiency anemia 3/82/5053    Lichen planus 9/76/7341    Meralgia paraesthetica 10/22/2013    OA (osteoarthritis of spine) 02/15/2009    Obesity, unspecified     Other and unspecified hyperlipidemia     Pre-operative cardiovascular examination     For spine surgery    Right sided sciatica     Shortness of breath     Possible asthma, HCVD; less likely  CAD (Noted 03/15/09)    Sleep apnea 02/15/2009    uses cpap machine    Thallium stress test abnormal 03/20/2004    Partially transient, mod basal & mid anterior defect most c/w artifact; mild anterior ischemia less likely.  Neg EKG on pharm stress test.    Thyroid disease     hypothyroidism       Family History   Problem Relation Age of Onset    Hypertension Sister         x3    Hypertension Mother     Cancer Mother     Heart Surgery Sister     Heart Disease Sister     Hypertension Paternal Uncle     Cancer Maternal Aunt         breast    Breast Cancer Maternal Aunt     Glaucoma Son     Heart Defect Son     Diabetes Paternal Uncle     Heart Attack Sister        Current Outpatient Medications   Medication Sig Dispense Refill    lidocaine (LIDODERM) 5 % Place 1 patch onto the skin daily 12 hours on, 12 hours off. 30 patch 0    traMADol (ULTRAM) 50 MG tablet Take  1 tablet by mouth every 6 hours as needed for Pain for up to 7 days. Intended supply: 7 days. Take lowest dose possible to manage pain Max Daily Amount: 200 mg 28 tablet 0    hydrOXYzine HCl (ATARAX) 25 MG tablet Take 1 tablet by mouth every 8 hours as needed for Itching 14 tablet 0    vitamin D 25 MCG (1000 UT) CAPS Take 1 capsule by mouth daily      vitamin C (ASCORBIC ACID) 500 MG tablet Take 1 tablet by mouth daily      Multiple Vitamins-Minerals (MULTIVITAMIN ADULTS 50+ PO) Take 1 tablet by mouth daily      apixaban (ELIQUIS) 2.5 MG TABS tablet Take 1 tablet by mouth 2 times daily 60 tablet 0    ondansetron (ZOFRAN-ODT) 8 MG TBDP disintegrating tablet Place 1 tablet under the tongue every 8 hours as needed for Nausea or Vomiting 10 tablet 0    acetaminophen (TYLENOL) 500 MG tablet Take 2 tablets by mouth in the morning and 2 tablets at noon and 2 tablets in the evening. 180 tablet 0    pantoprazole (PROTONIX) 40 MG tablet Take 1 tablet by mouth daily (Patient taking differently: Take 1 tablet by mouth daily Taking twice daily) 30 tablet 0    docusate sodium  (COLACE) 100 MG capsule Take 1 capsule by mouth 2 times daily 60 capsule 0    enoxaparin Sodium (LOVENOX) 30 MG/0.3ML injection Inject 0.3 mLs into the skin daily 9 mL 0    amLODIPine (NORVASC) 5 MG tablet Take 1 tablet by mouth daily 90 tablet 3    Misc Natural Products (NEURIVA PO) Take 1 tablet by mouth in the morning and at bedtime      NONFORMULARY Take 1 tablet by mouth every evening Viviscal -1 tablet every evening for hair and scalp      gabapentin (NEURONTIN) 300 MG capsule Take 1 capsule by mouth 3 times daily for 180 days. Max Daily Amount: 900 mg (Patient taking differently: Take 1 capsule by mouth 3 times daily. Taking one in the morning and two in the evening) 270 capsule 1    potassium chloride (KLOR-CON M) 10 MEQ extended release tablet TAKE 1 TABLET EVERY DAY 90 tablet 3    levothyroxine (SYNTHROID) 100 MCG tablet Take 1 tablet by mouth daily 90 tablet 1    atorvastatin (LIPITOR) 40 MG tablet TAKE 1 TABLET EVERY DAY (NEED MD APPOINTMENT) 90 tablet 3    montelukast (SINGULAIR) 10 MG tablet Take 1 tablet by mouth every evening      albuterol sulfate HFA (PROVENTIL;VENTOLIN;PROAIR) 108 (90 Base) MCG/ACT inhaler Inhale 2 puffs into the lungs every 4 hours as needed for Wheezing or Shortness of Breath 18 g 5    cyanocobalamin 1000 MCG tablet Take 1 tablet by mouth every evening      ZINC PO Take 1 tablet by mouth daily      iron dextran complex (INFED) 50 MG/ML injection Infuse 19.5 mLs intravenously Pt gets these every other month      latanoprost (XALATAN) 0.005 % ophthalmic solution Place 1 drop into both eyes nightly      lidocaine (LIDODERM) 5 % Place 1 patch onto the skin every 24 hours       No current facility-administered medications for this visit.        Allergies   Allergen Reactions    Aspirin Other (See Comments)     GI distress,     Diphenhydramine  Other (See Comments)     Muscle jerking    Pollen Extract      Other reaction(s): Not Reported This Time       Past Surgical History:    Procedure Laterality Date    COLONOSCOPY N/A 04/25/2020    SCREENING COLONOSCOPY performed by Sloan Leiter, MD at [facility] ENDOSCOPY    COLONOSCOPY  02-24-12    normal (Dr. Angelique Holm)    COLONOSCOPY FLX DX W/COLLJ [facility] WHEN PFRMD  02-2007    +polyp(tubular adenoma); Dr Angelique Holm    HERNIA REPAIR      3x    HYSTERECTOMY ([address])      NEUROLOGICAL SURGERY  03-16-2009    s/p ACD & Fusion (Dr.P.Gurtner)    TOTAL HIP ARTHROPLASTY Left 01/08/2023    LEFT HIP TOTAL ARTHROPLASTY LATERAL APPROACH performed by Merdis Delay, DO at [facility] ENDOSCOPY N/A 06/12/2022    ENTEROSCOPY PUSH DIAGNOSTIC w/small intestinal bipolar cautery of multiple AVMs performed by Sloan Leiter, MD at [facility] ENDOSCOPY       Social History     Socioeconomic History    Marital status: Divorced     Spouse name: Not on file    Number of children: Not on file    Years of education: Not on file    Highest education level: Not on file   Occupational History    Not on file   Tobacco Use    Smoking status: Former     Current packs/day: 0.00     Average packs/day: 1 pack/day for 27.0 years (27.0 ttl pk-yrs)     Types: Cigarettes     Start date: 10/16/1977     Quit date: 04/25/2004     Years since quitting: 18.7     Passive exposure: Never    Smokeless tobacco: Never   Vaping Use    Vaping Use: Never used   Substance and Sexual Activity    Alcohol use: No    Drug use: Never    Sexual activity: Not Currently   Other Topics Concern    Not on file   Social History Narrative    Not on file     Social Determinants of Health     Financial Resource Strain: Low Risk  (01/04/2023)    Overall Financial Resource Strain (CARDIA)     Difficulty of Paying Living Expenses: Not hard at all   Food Insecurity: No Food Insecurity (01/08/2023)    Hunger Vital Sign     Worried About Running Out of Food in the Last Year: Never true     [place] in the Last Year: Never true   Transportation Needs: No Transportation Needs  (01/08/2023)    PRAPARE - Armed forces logistics/support/administrative officer (Medical): No     Lack of Transportation (Non-Medical): No   Physical Activity: Inactive (01/04/2023)    Exercise Vital Sign     Days of Exercise per Week: 0 days     Minutes of Exercise per Session: 0 min   Stress: No Stress Concern Present (01/04/2023)    [place]     Feeling of Stress : Not at all   Social Connections: Moderately Integrated (01/04/2023)    Social Connection and Isolation Panel [NHANES]     Frequency of Communication with Friends and Family: More than three times a  week     Frequency of Social Gatherings with Friends and Family: More than three times a week     Attends Religious Services: More than 4 times per year     Active Member of [employer] or Organizations: Yes     Attends Music therapist: More than 4 times per year     Marital Status: Divorced   Intimate Partner Violence: Not At Risk (01/04/2023)    Humiliation, Afraid, Rape, and Kick questionnaire     Fear of Current or Ex-Partner: No     Emotionally Abused: No     Physically Abused: No     Sexually Abused: No   Housing Stability: [place]  (01/08/2023)    Housing Stability Vital Sign     Unable to Pay for Housing in the Last Year: No     Number of [place] in the Last Year: 1     Unstable Housing in the Last Year: No       REVIEW OF SYSTEMS:      No changes from previous review of systems unless noted.      Prescription medication management discussed with patient.     Electronically signed by: Antoine Poche, PA-C    Note: This note was completed using voice recognition software.  Any typographical/name errors or mistakes are unintentional.

## 2023-01-18 NOTE — Progress Notes (Signed)
[name] daughter dropped off short-term disability forms to be completed and signed.

## 2023-01-19 ENCOUNTER — Ambulatory Visit: Payer: MEDICARE | Attending: Orthopaedic Surgery | Primary: Internal Medicine

## 2023-01-19 NOTE — Care Coordination-Inpatient (Signed)
Care Transitions Note    Follow Up Call      Contacted daughter Thana Farr by telephone to follow up after discharge on 01/08/23 for left THR. She stated she was unable to talk at the moment and requested a call back between 1-1:30 pm today or tomorrow morning. CTN was unavailable to call her back between 1-1:30 pm, so plan to call her tomorrow.      Care Summary Note: Patient attended appointment with orthopedics on 01/18/23. Lidocaine patches prescribed. No acute concerns noted.    Follow Up Appointment:   Future Appointments         Provider Specialty Dept Phone    02/15/2023 9:45 AM Merdis Delay, DO Orthopedic Surgery [phone]    02/15/2023 11:00 AM [facility] LAB VISIT Internal Medicine [phone]    03/01/2023 8:20 AM Thana Farr, MD Internal Medicine [phone]    09/16/2023 10:20 AM Lessie Dings, PA-C Urology [phone]          Plan for follow up call in 1 day.    Olam Idler, RN

## 2023-01-20 NOTE — Care Coordination-Inpatient (Signed)
Care Transitions Note    Follow Up Call      Patient Current Location:  [facility] Transition Nurse contacted the family, ([place] form verified; on file) by telephone to follow up after discharge on 01/08/23 for left THR. Verified name and DOB as identifiers.    Challenges to be reviewed by the provider   Additional needs identified to be addressed with provider: No  none               Method of communication with provider: none.    Care Summary Note: Daughter reports patient is doing well. She saw orthopedic specialist this past Monday and was advised incision is healing well and repeat x-ray looks great. She is still taking Tramadol for pain management and pain level was higher this past weekend, which daughter notes may be r/t rain. Continues applying ice every 2 hours. Daughter will pick up lidocaine patches today. Daughter discussed concerns r/t patient's pain level and was advised she is progressing as expected and everyone's recovery looks different. The patient continues to get up to walk regularly and family is encouraging her not to over do her activity. Her hives have almost completely resolved.     Addressed changes since last contact:  none         Advance Care Planning:   Does patient have an Advance Directive: not on file .    Medication Review:  Medication review was performed with family. Full medication reconciliation completed during previous call.  No changes since last call.     Remote Patient Monitoring:  Offered patient enrollment in the Remote Patient Monitoring (RPM) program for in-home monitoring:  No, patient in active recovery from recent surgery without any acute issues r/t chronic conditions .    Assessments:  Care Transitions Subsequent and Final Call    Subsequent and Final Calls  Do you have any ongoing symptoms?: No  Have your medications changed?: No  Do you have any questions related to your medications?: No  Do you currently have any active services?: No  Do you have any needs  or concerns that I can assist you with?: No  Identified Barriers: Stress  Care Transitions Interventions  No Identified Needs  Other Interventions:              Follow Up Appointment:   Reviewed upcoming appointment(s). and Patient attended [facility] appointment as scheduled   Future Appointments         Provider Specialty Dept Phone    02/15/2023 9:45 AM Merdis Delay, DO Orthopedic Surgery [phone]    02/15/2023 11:00 AM [facility] LAB VISIT Internal Medicine [phone]    03/01/2023 8:20 AM Thana Farr, MD Internal Medicine [phone]    09/16/2023 10:20 AM Lessie Dings, PA-C Urology [phone]            Care Transition Nurse provided contact information.  Plan for follow-up call in 6-10 days based on severity of symptoms and risk factors.  Plan for next call: symptom management-assess for post op red flags    Olam Idler, RN

## 2023-01-21 NOTE — Progress Notes (Signed)
Call and informed patient that form is ready to pick-up. Patient verbalized understanding. Copy is placed in bin to be scanned in.

## 2023-01-22 ENCOUNTER — Encounter

## 2023-01-22 MED ORDER — TRAMADOL HCL 50 MG PO TABS
50 | ORAL_TABLET | Freq: Four times a day (QID) | ORAL | 0 refills | Status: AC | PRN
Start: 2023-01-22 — End: 2023-01-29

## 2023-01-22 NOTE — Telephone Encounter (Signed)
Patient's daughter called again to follow up on status of refill, states her mother is out of meds and is allergic to the only other med they have and won't take it.     Requesting a call back as soon as possible. Tel. [phone].

## 2023-01-22 NOTE — Telephone Encounter (Signed)
Patient's daughter called again regarding below encounter. Please call patient when prescription is sent, Tel. [phone].

## 2023-01-22 NOTE — Telephone Encounter (Signed)
Pt requested refill of tramadol. She is unable to take oxycodone due to allergic reaction and states that the tramadol is helping her with her pain. She is 2 weeks s/p total hip arthroplasty.

## 2023-01-22 NOTE — Telephone Encounter (Signed)
k

## 2023-01-22 NOTE — Telephone Encounter (Signed)
Patients daughter called on behalf of patient to remind [place] that she is in need of refill for traMADol (ULTRAM) 50 MG tablet today.  Please call in to [place] on [address].     Patient will be out of medicine tomorrow. The patient's daughter ask if you will please not let her go through the weekend without her medicine.    Patient's daughter is requesting a call back when medication has been called in to the pharmacy.    Patient's daughter Kathlen [place] [phone]

## 2023-01-22 NOTE — Telephone Encounter (Signed)
Patients daughter called on behalf of patient to remind [place] that she is in need of refill for traMADol (ULTRAM) 50 MG tablet today.  Please call in to [place] on [address].

## 2023-01-25 NOTE — Telephone Encounter (Signed)
Talk and informed Pa Kaylah that the patient has gotten her medications and that she is doing fine. Patient daughter Francetta Found informed me that patient has bump in the butt and has been using neosporin and ice. Informed Pa Kaylah and she states that that is ok and to put a padded on the bump so that it wont get irritated. Patient daughter verbalized understanding.

## 2023-01-26 MED FILL — ENOXAPARIN SODIUM 30MG/0.3ML SOSY: 30 30 MG/0.3ML | 10 days supply | Qty: 3 | Fill #0 | Status: AC

## 2023-01-27 NOTE — Care Coordination-Inpatient (Signed)
Care Transitions Note    Follow Up Call      Patient Current Location:  Home: [address]  [place] [address]    Care Transition Nurse contacted the family, ([place] form verified; on file) by telephone. Verified name and DOB as identifiers.    Challenges to be reviewed by the provider   Additional needs identified to be addressed with provider: Yes    Family plans to call PCP office today to schedule appointment to address the following concerns:    Patient c/o decreased appetite past few days with very minimal PO intake and dizziness/lightheadedness last night and this morning. Family is encouraging food intake and patient is willing to try smoothies/milk shakes, but not willing to try meal supplements such as Ensure. Drinking adequate fluids per family.    Last week she had 3 episodes of black stool which started off formed and then became soft. Denies any BRBPR. No BM since Friday, so she will try dulcolax, Sennakot, and/or suppository. Feels like she's getting constipation, but abdomen is not hard or distended. Taking Eliquis.    Family reports painful, nickel sized hard bump on patient's buttocks. Denies redness or drainage, and skin remains closed. Applying neosporin and ice, and covered with soft cloth.             Method of communication with provider: chart routing.    Care Summary Note: Family reviewed current concerns, as note above in yellow box.    Addressed changes since last contact:  Communication with providers: Chart routed concerns to PCP.  Review of patient management of conditions/medications: CTN recommended dulcolax or Sennakot and if no BM by tonight/tomorrow morning, try suppository. Encouraged adequate fluid intake and smoothies/milk shakes to improve nutrition. Patient not willing to drink meal supplements. Confirmed not acute GIB red flags other than possible dizziness which may be r/t poor food intake. Family confirmed plan to call PCP office today to schedule appt to address  concerns.       Advance Care Planning:   Does patient have an Advance Directive: not on file .    Medication Review:  Full medication reconciliation completed during previous call. and No changes since last call.     Remote Patient Monitoring:  Offered patient enrollment in the Remote Patient Monitoring (RPM) program for in-home monitoring:  No, patient in active recovery from recent surgery without any acute issues r/t chronic conditions .    Assessments:  Care Transitions Subsequent and Final Call    Subsequent and Final Calls  Do you have any ongoing symptoms?: Yes  Onset of Patient-reported symptoms: In the past 7 days  Patient-reported symptoms: Constipation, Other  Interventions for patient-reported symptoms: Notified PCP/Physician  Have your medications changed?: No  Do you have any questions related to your medications?: No  Do you currently have any active services?: No  Do you have any needs or concerns that I can assist you with?: Yes  Patient-reported Needs or Concerns: Requested recommendation for treatment of constipation.  Identified Barriers: Stress  Care Transitions Interventions  No Identified Needs  Other Interventions:              Follow Up Appointment:   Patient attended [facility] appointment as scheduled   Future Appointments         Provider Specialty Dept Phone    02/15/2023 9:45 AM Merdis Delay, DO Orthopedic Surgery [phone]    02/15/2023 11:00 AM [facility] LAB VISIT Internal Medicine [phone]    03/01/2023  8:20 AM Thana Farr, MD Internal Medicine [phone]    09/16/2023 10:20 AM Lessie Dings, PA-C Urology [phone]            Care Transition Nurse provided contact information.  Plan for follow-up call in 2-5 days based on severity of symptoms and risk factors.  Plan for next call: symptom management-assess for GIB red flags, change in appetite and constipation  follow-up appointment-confirm if appt with PCP was scheduled    Olam Idler, RN

## 2023-01-29 ENCOUNTER — Encounter

## 2023-01-29 MED ORDER — TRAMADOL HCL 50 MG PO TABS
50 | ORAL_TABLET | Freq: Three times a day (TID) | ORAL | 0 refills | Status: AC | PRN
Start: 2023-01-29 — End: 2023-02-05

## 2023-01-29 NOTE — Telephone Encounter (Signed)
Patient's daughter Kathlen Brunswick states that she recently had left hip sx on 01/08/2023 & she had a bad fall last night so she made an appt to come in on Monday 02/01/2023 at the [facility] location.    She's also requesting refill of Tramadol due to the fall.    Pharmacy:    [facility] [address], [place] [phone]       Patient's daughter can be reached at [phone].

## 2023-01-29 NOTE — Progress Notes (Signed)
Spoke with patient's daughter regarding her fall last night. She says that her mom fell after getting up and trying to walk by herself. The daughter was there and was able to help her up after the fall. They have been icing the hip and taking tylenol and tramadol for the pain. The daughter said that her mom is getting better and the pain is improving. I suggested that they go to the ER if her mother's pain increases or if she is unable to walk secondary to the pain. They have an appointment with [place] Monday at [facility] to look at her left hip. The daughter verbalized understanding and said that she would take her mom to the ED if anything changes.     She requested tramadol for her mom to help her with the pain through the weekend. I sent in 14 tabs to their pharmacy, decreased from the 28 I had previously given her.

## 2023-02-01 ENCOUNTER — Ambulatory Visit
Admit: 2023-02-01 | Discharge: 2023-02-01 | Payer: MEDICARE | Attending: Orthopaedic Surgery | Primary: Internal Medicine

## 2023-02-01 VITALS — Ht 62.0 in | Wt 143.0 lb

## 2023-02-01 DIAGNOSIS — Z96642 Presence of left artificial hip joint: Principal | ICD-10-CM

## 2023-02-01 NOTE — Progress Notes (Signed)
Patient: Elaine [name]                MRN: [id number]       SSN: [id number]  Date of Birth: 1951-10-08        AGE: 72 y.o.        SEX: female    PCP: Thana Farr, MD  02/01/23    Chief Complaint: Hip Pain (Left hip )      1. Status post left hip replacement  -     AMB POC X-RAY RADEX HIP UNI WITH PELVIS 2-3 VIEWS  -     [place], [place]  2. Sciatica of left side    HPI:  BRIYANA [name] is a 72 y.o. female with chief complaint of   Chief Complaint   Patient presents with    Hip Pain     Lt      -Left total hip arthroplasty, posterior lateral approach, January 08, 2023, Dr. Garner Nash    We have been planning on a left total hip arthroplasty through the anterolateral approach due to some pain in the lateral hip as well as a overhanging pannus.  She recently, in July, had a GI bleed and has had a cascade of medical issues since then.  She is hoping that she can have her hip replaced at the beginning of the year.        06/22/2022     1:33 PM   AMB PAIN ASSESSMENT   Location of Pain Hip   Location Modifiers Left   Severity of Pain 0   Quality of Pain Aching   Duration of Pain Persistent   Frequency of Pain Intermittent   Aggravating Factors Standing;Walking;Stairs   Limiting Behavior Yes   Relieving Factors Other (Comment)   Result of Injury No   Work-Related Injury No   Are there other pain locations you wish to document? No     Tobacco Use: Medium Risk (02/01/2023)    Patient History     Smoking Tobacco Use: Former     Smokeless Tobacco Use: Never     Passive Exposure: Never     IMAGING:  Imaging read by myself and interpreted as follows:    February 01, 2023:  Three-view x-ray of the left hip including AP pelvis and AP and lateral of the left hip demonstrates maintenance of the position of the implants without change in position or evidence of subsidence or fracture.  No evidence of complication.  The left leg is approximately centimeter longer than the right with mildly  increased offset.      January 18, 2023:  3 view x-ray of the left hip including AP pelvis, AP and lateral, demonstrates a well positioned left total hip arthroplasty without signs of loosening or complication. Leg lengths appear to be equal.     Dec 08, 2022:  3 view x-ray of the left hip including AP pelvis, and AP and lateral demonstrating severe joint space narrowing with evidence of osteophytosis, subchondral sclerosis, and subchondral cyst formation.     2 view x-ray of the lumbar spine, sitting and standing, demonstrates facet joint arthropathy and decreased disc space at multiple levels. The change in SS is 21.2.     September 25, 2022:  3 view x-ray of the left hip including AP pelvis and AP and crosstable lateral of the left hip demonstrate significant joint space loss acetabular joint of approximately 90%.  There is osteophytosis,  subchondral sclerosis and subchondral  cyst formation.      PHYSICAL EXAMINATION:  Ht 1.575 m (5' 2"$ )   Wt 64.9 kg (143 lb)   BMI 26.16 kg/m   Body mass index is 26.16 kg/m.  Wt Readings from Last 3 Encounters:   02/01/23 64.9 kg (143 lb)   01/08/23 69.5 kg (153 lb 3.5 oz)   01/04/23 67.9 kg (149 lb 9.6 oz)       GENERAL: Alert and oriented x3, in no acute distress.  HEENT: Normocephalic, atraumatic.    MSK: Left Hip Exam     Tenderness   The patient is experiencing tenderness in the lateral.    Range of Motion   Flexion:  110   External rotation:  30   Internal rotation: 10     Muscle Strength   Abduction: 5/5   Adduction: 5/5   Flexion: 5/5     Other   Erythema: absent  Scars: present  Sensation: normal  Pulse: present    Comments:  Incision consistent with posterior lateral approach is clean dry and intact.  No erythema and no tenderness to palpation.  Epithelialized without drainage.  There is also a small abrasion in the patient's superior gluteal cleft more to the left side.  There is no surrounding erythema.  There is mild tenderness to palpation.        ASSESSMENT &  PLAN:    February 01, 2023:  3 and half weeks status post left total hip arthroplasty through the posterior lateral approach.  Patient had a fall about 4 days ago when she slipped on the hardwood floor wearing socks without grippers on the.  She reports that she is feeling much better and she is ambulating well today.  She is complaining more of left shoulder pain than her hip.  She has seen Dr. Guilford Shi in the past and they urged her to make a new appointment with him.  I will have home health work with the patient for the next 2 weeks as well as do a home assessment.  If she needs further physical therapy following those 2 weeks we can order outpatient physical therapy.  She does have a small abrasion in the gluteal cleft which is not concerning and seems to be healing well.  I will see her in 6 weeks and no x-rays will be needed unless there is any increasing pain.    January 18, 2023:  10 days s/p left THA, lateral approach. Pt is continuing to improve. Her pain fluctuates based off of her activity level. She was getting up and walking 8-10 steps every hour but this started to increase her pain so they have cut it back to every 2 hours but continue to do exercise while she is seated. She had an allergic reaction to the oxycodone that was given to her post op and she developed intense itching. She stopped the oxycodone once this started and I prescribed her hydroxyzine due to her allergy to benadryl. The itching has subsided since. She is currently taking tramadol for her pain every 8 hours along with tylenol and reports that this is helping with her pain. She is also taking the eliquis prescribed to her due to her aspirin allergy. She denies calf pain and has no calf pain, swelling, and negative homan's on exam. Her incision is clean, dry, and intact without ttp or erythema. She states that most of her pain is around her incision. She continues to walk with a walker for support. We will have  her follow up with  Dr. Garner Nash on 4 weeks.     She also asked for lidocaine patches today, which were prescribed, and I will refill her tramadol when it is due this coming Friday.       December 08, 2022:  Primary osteoarthritis of the left hip. Pt is here for her H&P appointment prior to her 12/23/22 THA. 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 She has been cleared by her PCP for surgery and will be following up with her hematologist prior to her surgery for clearance due to her chronic iron deficiency anemia and low hemoglobin numbers on her last labs. They are out of the [facility] network, so they will be faxing that over once completed. She receives iron infusions every other month and had her last one in November.     She has allergies to aspirin and diphenhydramine. Due to this she will get lovenox 30 mg as DVT prophylaxis. Due to her history of GI bleed, we will not prescribe meloxiam. I will order her post operative medications once she receives clearance from hematology.     September 25, 2022:  We will continue to plan on doing a left total hip arthroplasty through an anterolateral approach.  Again addressed the benefits and risks of anterolateral versus direct anterior the patient would like to continue the anterior lateral plan.  She does need to improve her health and get stabilized with recent GI bleed.  We will hope to move forward after the first of the year.    Apr 20, 2022:  She failed conservative treatment with Tylenol, ibuprofen and gabapentin.  She did get a left hip corticosteroid injection intra-articularly which gave her only a month of greater than 90% relief of her pain.  At this point would like to go forward with a left total hip replacement through the anterolateral approach.  I did discuss with her the risks and benefits of a direct anterior versus anterolateral approach and she did  agree to go forward with anterolateral approach.  She has an overhanging pannus that reaches down the thigh quite a bit.  She is currently working on adjusting her thyroid medications to get her hypothyroidism under control.  Her other medical comorbidities under medication control at this time.  She is currently working in childcare and would like to go forward with surgery in July as her last day of work is the end of June.      Past Medical History:   Diagnosis Date    Anemia     Asthma     Colon polyp     DDD (degenerative disc disease) 02/15/2009    Depression 12/18/2011    DVT (deep venous thrombosis) ([place]) 10/23/2009    Left leg:  No DVT.      Essential hypertension, benign     History of blood transfusion     History of echocardiogram 03/20/2004    EF 70%.  Borderline DDfx.  No significant valvular pathology.    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  Neg EKG on pharm stress test.    Thyroid disease     hypothyroidism       Family History   Problem Relation Age of Onset    Hypertension Sister         x3    Hypertension Mother     Cancer Mother     Heart Surgery Sister     Heart Disease Sister     Hypertension Paternal Uncle     Cancer Maternal Aunt         breast    Breast Cancer Maternal Aunt     Glaucoma Son     Heart Defect Son     Diabetes Paternal Uncle     Heart Attack Sister        Current Outpatient Medications   Medication Sig  Dispense Refill    traMADol (ULTRAM) 50 MG tablet Take 1 tablet by mouth every 8 hours as needed for Pain for up to 7 days. Max Daily Amount: 150 mg 14 tablet 0    lidocaine (LIDODERM) 5 % Place 1 patch onto the skin daily 12 hours on, 12 hours off. 30 patch 0    vitamin D 25 MCG (1000 UT) CAPS Take 1 capsule by mouth daily      vitamin C (ASCORBIC ACID) 500 MG tablet Take 1 tablet by mouth daily      Multiple Vitamins-Minerals (MULTIVITAMIN ADULTS 50+ PO) Take 1 tablet by mouth daily      apixaban (ELIQUIS) 2.5 MG TABS tablet Take 1 tablet by mouth 2 times daily 60 tablet 0    ondansetron (ZOFRAN-ODT) 8 MG TBDP disintegrating tablet Place 1 tablet under the tongue every 8 hours as needed for Nausea or Vomiting 10 tablet 0    acetaminophen (TYLENOL) 500 MG tablet Take 2 tablets by mouth in the morning and 2 tablets at noon and 2 tablets in the evening. 180 tablet 0    pantoprazole (PROTONIX) 40 MG tablet Take 1 tablet by mouth daily (Patient taking differently: Take 1 tablet by mouth daily Taking twice daily) 30 tablet 0    docusate sodium (COLACE) 100 MG capsule Take 1 capsule by mouth 2 times daily 60 capsule 0    enoxaparin Sodium (LOVENOX) 30 MG/0.3ML injection Inject 0.3 mLs into the skin daily 9 mL 0    amLODIPine (NORVASC) 5 MG tablet Take 1 tablet by mouth daily 90 tablet 3    Misc Natural Products (NEURIVA PO) Take 1 tablet by mouth in the morning and at bedtime      NONFORMULARY Take 1 tablet by mouth every evening Viviscal -1 tablet every evening for hair and scalp      gabapentin (NEURONTIN) 300 MG capsule Take 1 capsule by mouth 3 times daily for 180 days. Max Daily Amount: 900 mg (Patient taking differently: Take 1 capsule by mouth 3 times daily. Taking one in the morning and two in the evening) 270 capsule 1    potassium chloride (KLOR-CON M) 10 MEQ extended release tablet TAKE 1 TABLET EVERY DAY 90 tablet 3    levothyroxine (SYNTHROID) 100 MCG tablet Take 1 tablet  by mouth daily 90 tablet 1     atorvastatin (LIPITOR) 40 MG tablet TAKE 1 TABLET EVERY DAY (NEED MD APPOINTMENT) 90 tablet 3    montelukast (SINGULAIR) 10 MG tablet Take 1 tablet by mouth every evening      albuterol sulfate HFA (PROVENTIL;VENTOLIN;PROAIR) 108 (90 Base) MCG/ACT inhaler Inhale 2 puffs into the lungs every 4 hours as needed for Wheezing or Shortness of Breath 18 g 5    cyanocobalamin 1000 MCG tablet Take 1 tablet by mouth every evening      ZINC PO Take 1 tablet by mouth daily      iron dextran complex (INFED) 50 MG/ML injection Infuse 19.5 mLs intravenously Pt gets these every other month      latanoprost (XALATAN) 0.005 % ophthalmic solution Place 1 drop into both eyes nightly      lidocaine (LIDODERM) 5 % Place 1 patch onto the skin every 24 hours       No current facility-administered medications for this visit.        Allergies   Allergen Reactions    Aspirin Other (See Comments)     GI distress,     Diphenhydramine Other (See Comments)     Muscle jerking    Pollen Extract      Other reaction(s): Not Reported This Time       Past Surgical History:   Procedure Laterality Date    COLONOSCOPY N/A 04/25/2020    SCREENING COLONOSCOPY performed by Sloan Leiter, MD at [facility]COPY    COLONOSCOPY  02-24-12    normal (Dr. Angelique Holm)    COLONOSCOPY FLX DX W/COLLJ [facility] WHEN [facility]  02-2007    +polyp(tubular adenoma); Dr Angelique Holm    HERNIA REPAIR      3x    HYSTERECTOMY ([place])      NEUROLOGICAL SURGERY  03-16-2009    s/p ACD & Fusion (Dr.P.Gurtner)    TOTAL HIP ARTHROPLASTY Left 01/08/2023    LEFT HIP TOTAL ARTHROPLASTY LATERAL APPROACH performed by Merdis Delay, DO at [place] ENDOSCOPY N/A 06/12/2022    ENTEROSCOPY PUSH DIAGNOSTIC w/small intestinal bipolar cautery of multiple AVMs performed by Sloan Leiter, MD at [facility] ENDOSCOPY       Social History     Socioeconomic History    Marital status: Divorced     Spouse name: Not on file    Number of children: Not on file     Years of education: Not on file    Highest education level: Not on file   Occupational History    Not on file   Tobacco Use    Smoking status: Former     Current packs/day: 0.00     Average packs/day: 1 pack/day for 27.0 years (27.0 ttl pk-yrs)     Types: Cigarettes     Start date: 10/16/1977     Quit date: 04/25/2004     Years since quitting: 18.7     Passive exposure: Never    Smokeless tobacco: Never   Vaping Use    Vaping Use: Never used   Substance and Sexual Activity    Alcohol use: No    Drug use: Never    Sexual activity: Not Currently   Other Topics Concern    Not on file   Social History Narrative    Not on file     Social Determinants of Health  Financial Resource Strain: Low Risk  (01/04/2023)    Overall Financial Resource Strain (CARDIA)     Difficulty of Paying Living Expenses: Not hard at all   Food Insecurity: No Food Insecurity (01/08/2023)    Hunger Vital Sign     Worried About Running Out of Food in the Last Year: Never true     Ran Out of Food in the Last Year: Never true   Transportation Needs: No Transportation Needs (01/08/2023)    PRAPARE - Armed forces logistics/support/administrative officer (Medical): No     Lack of Transportation (Non-Medical): No   Physical Activity: Inactive (01/04/2023)    Exercise Vital Sign     Days of Exercise per Week: 0 days     Minutes of Exercise per Session: 0 min   Stress: No Stress Concern Present (01/04/2023)    [place]     Feeling of Stress : Not at all   Social Connections: Moderately Integrated (01/04/2023)    Social Connection and Isolation Panel [NHANES]     Frequency of Communication with Friends and Family: More than three times a week     Frequency of Social Gatherings with Friends and Family: More than three times a week     Attends Religious Services: More than 4 times per year     Active Member of [employer] or Organizations: Yes     Attends Archivist Meetings: More than 4 times per year      Marital Status: Divorced   Intimate Partner Violence: Not At Risk (01/04/2023)    Humiliation, Afraid, Rape, and Kick questionnaire     Fear of Current or Ex-Partner: No     Emotionally Abused: No     Physically Abused: No     Sexually Abused: No   Housing Stability: [place]  (01/08/2023)    Housing Stability Vital Sign     Unable to Pay for Housing in the Last Year: No     Number of [place] in the Last Year: 1     Unstable Housing in the Last Year: No       REVIEW OF SYSTEMS:      No changes from previous review of systems unless noted.      Prescription medication management discussed with patient.     Electronically signed by: Merdis Delay, DO    Note: This note was completed using voice recognition software.  Any typographical/name errors or mistakes are unintentional.

## 2023-02-02 ENCOUNTER — Encounter: Admit: 2023-02-02 | Discharge: 2023-02-02 | Payer: MEDICARE | Primary: Internal Medicine

## 2023-02-02 NOTE — Care Coordination-Inpatient (Signed)
Care Transitions Note    Follow Up Call      Attempted to reach patient's daughter Thana Farr for transitions of care follow up.  Unable to reach family.      Outreach Attempts:   HIPAA compliant voicemail left for family, (PHI form verified; on file).     Care Summary Note: Patient had a recent fall due to slipping on hardwood floor, notified orthopedic specialist, and attended appointment on 02/01/23. Referral placed to [facility] and plan for referral to outpatient PT if she needs PT after 2 weeks.    Follow Up Appointment:   Future Appointments         Provider Specialty Dept Phone    02/02/2023 TBD Jacklynn Ganong, [place]     02/15/2023 11:00 AM [facility] LAB VISIT Internal Medicine [phone]    02/19/2023 10:30 AM Waynetta Pean, MD Orthopedic Surgery [phone]    03/01/2023 8:20 AM Thana Farr, MD Internal Medicine [phone]    03/29/2023 1:00 PM Merdis Delay, DO Orthopedic Surgery [phone]    09/16/2023 10:20 AM Lessie Dings, PA-C Urology [phone]            Plan for follow-up call in 6-10 days based on severity of symptoms and risk factors. Plan for next call: symptom management-assess for post-op and post-fall red flags  referrals-confirm home health [place], RN

## 2023-02-02 NOTE — Home Health (Signed)
PT SOC:  Pt is a 72 y/o female who is s/p L posterolateral approach THR on 01/08/23 by Dr. Abran Richard..  Pt has been receiving OPPT.  She had a fall on 01/28/23 due to walking with only socks on.  Dr. Abran Richard saw her on 02/01/23 and x-ray of L hip revealed "maintenance of the position of the implants without change in position or evidence of subsidence or fracture.  No evidence of complication." Pt is referred to home care PT for post op THR PT and home safety assessment, and then to transition back to OPPT when ready.  PMH:   Asthma; DOE (Dyspnea on Exertion); GERD (Gastroesophageal Reflux Disease); Hypothyroidism; Vitamin D Deficiency;  Submental lymphadenopathy;  Sciatica;  Depression;  Meralgia paraesthetica;  Pre-diabetes;  Abnormal stress test;  Left knee pain;  Osteoarthritis of left knee; Chiari I malformation;   Migraine without aura and without status migrainosus, not intractable;  Claustrophobia;  Chronic midline low back pain without sciatica;  Iron deficiency anemia;  HTN (hypertension), benign;  Memory problem;  Sore throat; Left leg pain;  Muscle spasm of left lower extremity; Primary osteoarthritis of left hip;  Chronic diastolic (congestive) heart failure;  Pure hypercholesterolemia, unspecified;  Right renal atrophy;  Status post left hip replacement.  PLOF/living environment:  ind and ambulatory without AD, worked at [employer] until December 2023.  Lives with daughter Lawson Radar and family in a 2 story home, with bedroom and bathroom/shower on first floor.    PREC:  high fall risk;  WBAT LLE;  full THR precautions;    S:  Pt reported fall on 01/28/23 and daughter reported change in meds.  [name] goals :  to walk again and not fall.  O:  Pain:  L hip = 1-7 /10;  L shoulder = 3-7/10. Pt to make appointment to see Dr. Guilford Shi regarding L shoulder pain.    Pain and edema control:  take pain pills as prescribed and 1-2 hours prior to PT arrival, apply ice x 20 mins on and 40 mins off during awake time to affected  hip/thigh, with barrier to maintain skin integrity.  Integumentary:  L lateral hip incision = newly epithelialized, open to air.  Pressure injury:  stage 2 pressure injury on L gluteal cleft x L = 1.5 cm x W = 0.5 cm x depth = 0.1 cm, open to air.  Per Dr. Abran Richard, "skin abrasion is not concerning".     Pic in media.  Pressure relief strategies:  change body position every 30 minutes to 1 hour; practice good hygiene in buttcks area.  Edema:  minimal edema on  L hip and thigh.  ROM:  all peripheral jts = WFL except  L hip  limited ROM due to pain and THR precaution;  MMT:  BUE = 3+/5;  LLE =  3+.  RLE = 3+ to 4/5.  Physical activity:  Do THR HEP 3x/day, walk every hour with AD in daytime, with supevision and with AD.    THR HEP:  semi-recline:  ap, qs/gs x 20 reps each.    Bed mobility:  sleeps on bed with adjustable bedframe;  sit <> supine = SBA with difficulty with LLE and needs reminders on her THR precautions.  Pt was also advised to lower angle of head of bed when in supine.  Transfers:  sit <> stand = SBA with rollator;  needs reminders on correct hand placement.  Rollator height adjusted.  Rollator brakes are not working and this was mentioned to  daughter that brakes need to be fixed or get a FWW which is preferred to give her stability.  TUG:  34.7 sec with rollator, indicating high fall risk.  Tinetti = 18/28, indicative of high fall risk.  Fall risk reduction:  use non slip shoes, ask for assistance, use FWW when out of bed.    Gait:  20 ft x 2, SBA, rollator,  WBAT, exhibiting mild antalgia and pushes walker too far.  Needs cueing on correct walker use and always use non skid socks or shoes.  Patient education on   High risk medications:  Instructed patient/caregiver to notify PT of signs and symptoms of anticoagulant adverse effects including bleeding gums, blood in urine or stool, easily bruising.  Instructed on importance of fall prevention due to risk for bleeding.  Instructed patient/caregiver to  take exact amount of narcotics prescribed, signs and symptoms of oversedation, notify PT if oversedated.  May cause constipation, notify PT if no BM x 3 days.   Patient level of understanding of education provided: verbalized instructions with minimal cueing.  Patient response to treatment:  motivated and compliant.  Caregiver involvement/assistance needed: daughter Lawson Radar and son-in-law (name of caregiver) assists with self care, ADLs, iADLs, functional mobility, transportation.  A:  Pt is s/p L posterolateral THR and with recent fall without injury, presents with decreased gait, transfers and bed mobility resulting to increased fall risk and need for assistance.  Pt will benefit from PT for strengthening of affected lower extremity and be ind and safe in her gait, transfers, stairs negotiation with LRAD.  Pt also has a stage 2 pressure injury on gluteal cleft that requires monitoring and pt education on pressure relief strategies.  P:  3w2, 2w2 for post op posterolateral approach THR PT protocol per Dr. Abran Richard.  F/u visit with ortho surgeon Dr. Abran Richard is in 6 weeks.  Informed referring Dr. Abran Richard via EPIC of start of care with home care agency with PT.  SOC book:  Legal/pt selected representative identified as Kathrynn Humble.  Obtained authorization for treatment.  Patient handbook was provided and reviewed with pt/CG.  Patient [place] of rights and advanced directive information reviewed.  Instructed on Agency 24 hour on call system and when to call 911 vs MD vs agency for changes in pt condition.  Instructed in and promoted pt/CG involvement in plan of care.  Emergency preparations done using information in admission booklet.  Informed Rehab clinical manager of PT frequency.

## 2023-02-03 ENCOUNTER — Encounter: Admit: 2023-02-03 | Discharge: 2023-02-03 | Payer: MEDICARE | Primary: Internal Medicine

## 2023-02-04 ENCOUNTER — Encounter: Payer: MEDICARE | Primary: Internal Medicine

## 2023-02-05 ENCOUNTER — Encounter: Admit: 2023-02-05 | Discharge: 2023-02-05 | Payer: MEDICARE | Primary: Internal Medicine

## 2023-02-05 NOTE — Home Health (Signed)
SUBJECTIVE: Patient states that she is doing well today, notes that she is feeling slightly ill, nasuea has been bothering her all day today. Notes that she has had no falls or changes in overall status.     CAREGIVER INVOLVEMENT/ASSISTANCE NEEDED FOR: Patient lives with her daughter in a two story home on the bottom floor. She currently requires assistance for completion of most to all ADL's such as cooking, cleaning, bathing and dressing.     OBJECTIVE:  See interventions.    PATIENT RESPONSE TO TREATMENT:  Pt encouraged by her tolerance to PT today, she was able to participate more in PT than she had previously thought possible. Reported improved confidence following PT today.     PATIENT LEVEL OF UNDERSTANDING OF EDUCATION PROVIDED: Patient verbalzied understanding of all education provided during session today.     ASSESSMENT OF PROGRESS TOWARD GOALS: Patient was seen for PT follow up session for LE strengthening, gait training and transfer training. Gait completed around downstairs portion of home with use of FWW on level surfaces, >336f completed before rest break was required. Showing improved distance and improved time in weightbearing befor rest break was required.     HOME EXERCISE PROGRAM: Patient given verbal instructions on HEP and how often to complete to maintain compliance. WIth intermitten gait training to be completed throughout the day.     THE FOLLOWING DISCHARGE PLANNING WAS DISCUSSED WITH THE PATIENT/CAREGIVER: [facility] goals [facility] been met or progressed well towards.     PLAN FOR NEXT VISIT: Cont PT PoC with focus on progression of LE strengthening, gait training and transfer training.

## 2023-02-05 NOTE — Home Health (Signed)
SUBJECTIVE: Patient states that she is doing well today, notes that she is feeling slightly ill, nasuea has been bothering her all day today. Notes that she has had no falls or changes in overall status.     CAREGIVER INVOLVEMENT/ASSISTANCE NEEDED FOR: Patient lives with her daughter in a two story home on the bottom floor. She currently requires assistance for completion of most to all ADL's such as cooking, cleaning, bathing and dressing.     OBJECTIVE:  See interventions.    PATIENT RESPONSE TO TREATMENT:  Pt encouraged by her tolerance to PT today, she was able to participate more in PT than she had previously thought possible. Reported improved confidence following PT today.     PATIENT LEVEL OF UNDERSTANDING OF EDUCATION PROVIDED: Pt provided education on importance of HEP and how often to complete to increase strength and decrease overall stiffness. Educated on signs and sx of infection and steps to take if one were to arise. Educated on importance of decreasing chance and bed sore and to make sure she is performing weight shifting every 1-2 hours.    ASSESSMENT OF PROGRESS TOWARD GOALS: Patient was seen for PT follow up session for LE strengthening, gait training and transfer training. Patient completed all therex asked of her today and was able to show good form throughout session. She requires some cuing and reminders for her LE precautions as she tends to internaly rotate at the hip as her relaxed posistion. Had patient verbalize her precautions and educated on the importance of maintaining this. Adjusted patients seated bedside commode height to better transfer patient to and from and for safer ranges of motion as she was close to 90* of seated hip flexion.     HOME EXERCISE PROGRAM: Patient given verbal instructions on HEP and how often to complete to maintain compliance. WIth intermitten gait training to be completed throughout the day.     THE FOLLOWING DISCHARGE PLANNING WAS DISCUSSED WITH THE  PATIENT/CAREGIVER: Patient to be D/C from [facility] PT when all goals have been met or progressed well towards.     PLAN FOR NEXT VISIT: Cont PT PoC with focus on progression of LE strengthening, gait training and transfer training.

## 2023-02-08 ENCOUNTER — Encounter: Admit: 2023-02-08 | Discharge: 2023-02-08 | Payer: MEDICARE | Primary: Internal Medicine

## 2023-02-08 ENCOUNTER — Encounter: Payer: MEDICARE | Primary: Internal Medicine

## 2023-02-08 NOTE — Care Coordination-Inpatient (Signed)
Care Transitions Note    Final Call      Patient Current Location:  Home: [place] [id number]    Care Transition Nurse contacted the family, ([place] form verified; on file) by telephone. Verified name and DOB as identifiers.    Patient graduated from the Care Transitions program on 02/08/23.  Patient/family verbalizes confidence in the ability to self-manage at this time..      Handoff:   Patient was not referred to the [facility] team due to no additional needs identified.       Care Summary Note: Patient has an appt tomorrow with her gastroenterologist Dr. Angelique Holm for follow up on black stools. Home health PT is scheduled every M-W-F x 4 weeks after a recent fall. She will begin outpatient PT first week of April. She has an appt with her shoulder specialist next Tuesday due to weakness. Her post-op incision is healing well. Appetite is poor so family continues encouraging her to eat. The hard bump on her buttocks turned out to be a bed sore, so family is applying Neosporin, patient is rotating in bed frequently, and the sore is healing. The skin is closed.    Assessments:  Care Transitions Subsequent and Final Call    Subsequent and Final Calls  Do you have any ongoing symptoms?: No  Do you have any questions related to your medications?: No  Do you currently have any active services?: Yes  Are you currently active with any services?: Home Health  Do you have any needs or concerns that I can assist you with?: No  Identified Barriers: None  Care Transitions Interventions  No Identified Needs  Other Interventions:                  Upcoming Appointments:    Future Appointments         Provider Specialty Dept Phone    02/10/2023 TBD Elonda Husky, [place]     02/10/2023 12:00 PM Fae Pippin, PTA Home Health Care     02/12/2023 10:30 AM Fae Pippin, PTA Home Health Care     02/15/2023 TBD Fae Pippin, PTA Home Health Care     02/15/2023 11:00 AM [facility] LAB VISIT Internal Medicine  [phone]    02/18/2023 TBD Fae Pippin, PTA Home Health Care     02/19/2023 10:30 AM Waynetta Pean, MD Orthopedic Surgery [phone]    02/22/2023 TBD Fae Pippin, PTA Home Health Care     02/25/2023 TBD Jacklynn Ganong, [place]     03/01/2023 8:20 AM Thana Farr, MD Internal Medicine [phone]    03/29/2023 1:00 PM Merdis Delay, DO Orthopedic Surgery [phone]    09/16/2023 10:20 AM Lessie Dings, PA-C Urology [phone]            Patient has agreed to contact primary care provider and/or specialist for any further questions, concerns, or needs.    Olam Idler, RN

## 2023-02-08 NOTE — Home Health (Signed)
SUBJECTIVE: Patient states that she is doing well today, notes that she is feeling slightly ill, nasuea has been bothering her all day today. Notes that she has had no falls or changes in overall status.     CAREGIVER INVOLVEMENT/ASSISTANCE NEEDED FOR: Patient lives with her daughter in a two story home on the bottom floor. She currently requires assistance for completion of most to all ADL's such as cooking, cleaning, bathing and dressing.     OBJECTIVE:  See interventions.    PATIENT RESPONSE TO TREATMENT:  Pt encouraged by her tolerance to PT today, she was able to participate more in PT than she had previously thought possible. Reported improved confidence following PT today.     PATIENT LEVEL OF UNDERSTANDING OF EDUCATION PROVIDED: Pt verbalized understanding of all education provided during session today.     ASSESSMENT OF PROGRESS TOWARD GOALS: Patient was seen for  T follow up session for LE strengthening, gait training and transfer training. Gait completed inside of home with use of FWW on level surfaces >325f, completed before rest break was requried. MOD I demosntrated with use of FWW on level and unlevel surfaces. Sit to stands from commode to stance with B UE pushoff from stance to complete 10 reps with MOD I demosntrated. Patient showing both improved funxtional independence with sit to stands and gait training today. Previous session required SBA.     HOME EXERCISE PROGRAM: Patient given verbal instructions on HEP and how often to complete to maintain compliance. WIth intermitten gait training to be completed throughout the day.     THE FOLLOWING DISCHARGE PLANNING WAS DISCUSSED WITH THE PATIENT/CAREGIVER: [facility] all goals have been met or progressed well towards.     PLAN FOR NEXT VISIT: Cont PT PoC with focus on progression of LE strengthening, gait training and transfer training.

## 2023-02-09 ENCOUNTER — Encounter: Payer: MEDICARE | Primary: Internal Medicine

## 2023-02-09 ENCOUNTER — Encounter: Admit: 2023-02-09 | Discharge: 2023-02-09 | Payer: MEDICARE | Primary: Internal Medicine

## 2023-02-09 NOTE — Home Health (Signed)
Skilled Reason for admission/summary of clinical condition: 72 year old female referred to [facility] with PDGM Dx: Left THR    Caregiver Involvement: Pt lives in two story home with daughter who are able to assist with all cares    PMHx: Asthma; DOE (Dyspnea on Exertion); GERD (Gastroesophageal Reflux Disease); Hypothyroidism; Vitamin D Deficiency; Submental lymphadenopathy; Sciatica; Depression; Meralgia paraesthetica; Pre-diabetes; Abnormal stress test; Left knee pain; Osteoarthritis of left knee; Chiari I malformation; Migraine without aura and without status migrainosus, not intractable; Claustrophobia; Chronic midline low back pain without sciatica; Iron deficiency anemia; HTN (hypertension), benign; Memory problem; Sore throat; Left leg pain; Muscle spasm of left lower extremity; Primary osteoarthritis of left hip; Chronic diastolic (congestive) heart failure; Pure hypercholesterolemia, unspecified; Right renal atrophy; Status post left hip replacement.    PLOF: Pt was ambulating without assistive devices and was independent with ADLs    Medications: Pt reports no changes to medications since last reviewed and educated to continue as directed per MD.    SUBJECTIVE: Pt reports minimal pain on this date. Pt daughter present throughout occupational therapy evaluation     DME ORDERED/RECOMMENEDED: N/A     OBJECTIVE:  BATHING: Pt has walk in shower with shower chair. Pt completes bathing with CGA.  TOILETING: Pt supervision for toileting on standard toilet  UB DRESSING: Pt CGA for upper body dressing to donn/doff shirts with increased difficulty  LB DRESSING: Pt minimal assistance for lower body dressing to donn/doff socks, shoes and pants  GROOMING: Pt supervision for grooming for hair care, oral care and washing hands/face while sitting on rollator at the sink  FEEDING: Pt independent for self feeding     Modified Borg RPE 6/10 after performing ambulation, transfers, and I/ADL assessment     BUE MMT: 4-/5  BUE AROM:  WFL     VISION: Pt vision is [facility] with glasses for reading directions/medications and to navigate their home environment safely.     BALANCE: Pt with good sitting balance/tolerance. Pt with fair- standing balance/tolerance.     BUE COORDINATION: Pt BUE hand coordination WFL shown through serial opposition test. Pt with good hand strength to open containers and manipulate clothing fasteners without difficulty.     BUE FINE MOTOR STRENGTH: Pt presents with B good hand strength for grasping objects for ADL/IADL tasks and opening containers. Pt is right handed.     OT instructed/demonstrated pt the following with good understanding:     IADL: Pt daughter completing all IADLs, however pt would like to return to completing in the future  AMBULATION: Pt CGA for ambulation using rollator for support.  EOB/BED TRANSFER: Pt supervision assistance for bed mobility.  COUCH: Pt SBA for sit to stand transfers   TOILET: Pt SBA for toilet transfers with standard toilet  TUB SHOWER: Pt CGA for shower transfers into/out of step in shower     PATIENT RESPONSE TO TREATMENT: Pt responded well to skilled home health occupational therapy assessment with no increase in pain or discomfort throughout     PATIENT EDUCATION PROVIDED THIS VISIT: OT role, adaptive equipment, RUE HEP, energy conservation, fall prevention/safety training ADL/IADL tasks, continue diet and medications as instructed per MD, consult MD or urgent care for medical assistance as opposed to ER unless situation emergent.    PATIENT LEVEL OF UNDERSTANDING OF EDUCATION PROVIDED: Pt able to teach back role of OT with good understanding. Pt able to teach back energy conservation while performing bathing, dressing, and setup such as set clothing out night  before, gather items required to perform task in one trip, sit while performing tasks, take rest breaks as needed, perform shower/bathing on days with no other appointments or activities scheduled, etc. Pt able to teach back  hip precautions. Pt able to teach back education on adaptive equipment to increase safety and independence with dressing tasks      REHAB POTENTIAL: Mrs. Hendley presents with good rehab potential to increase their strength, balance and safety needed for increasing their independence within their daily routine. Pt with recent THR decreasing her safety and independence with completing her daily routine. Pt agreeable to continued home health occupational therapy services to increase their safety and independence within their home environment.     HOME EXERCISE PROGRAM: Pt educated on BUE strengthening HEP. Pt provided with handout to reference. Pt continues to require education and modifications to HEP     CONTINUED NEED FOR THE FOLLOWING SKILLS: HH OT is medically necessary to address pain,  decreased functional strength, decreased independence and safety performing ADL/IADL tasks, decreased activity and standing tolerance, decreased functional endurance, and impaired balance in order to improve functional independence, obtain set goals, reduce risk of falls, reduce pain, improve quality of life, and return to PLOF.     Skilled Care Provided: Pt completed full occupational therapy assessment to include balance, coordination, strengthening, ADL education/training, functional mobility and home safety.     ASSESSMENT: Pt presents with increased assistance from family for her daily ADLs to include dressing. Pt would benefit from education/training on adaptive ADL techniques and equipment to increase her independence with her daily routine. Pt with increased assistance for functional mobility to include shower transfers and toilet transfers and would benefit from functional mobility training to increase her safety with mobility needed for ADLs. Pt presents with decreased BUE strength indicated by BUE MMT: 4-/5. Pt would benefit from BUE strengthening program to facilitate strength required to complete ADLs and functional  mobility.    PLAN: D/C 2w2, 1w1 with plans to discharge when goals are met or maximal potential achieved.

## 2023-02-10 ENCOUNTER — Encounter: Admit: 2023-02-10 | Discharge: 2023-02-10 | Payer: MEDICARE | Primary: Internal Medicine

## 2023-02-10 ENCOUNTER — Encounter: Payer: MEDICARE | Primary: Internal Medicine

## 2023-02-11 ENCOUNTER — Encounter: Payer: MEDICARE | Primary: Internal Medicine

## 2023-02-11 ENCOUNTER — Encounter: Primary: Internal Medicine

## 2023-02-11 NOTE — Home Health (Signed)
 SUBJECTIVE: Patient states that she is doing well today. Reports that she had some stomach cramps related to constipation. Patient reports that she has been taking medication to help with this and has been having small movements. Notes no falls or changes in overall status at this time.     CAREGIVER INVOLVEMENT/ASSISTANCE NEEDED FOR: Patient lives with her daughter in a two story home on the bottom floor. She currently requires assistance for completion of most to all ADL's such as cooking, cleaning, bathing and dressing.     OBJECTIVE:  See interventions.    PATIENT RESPONSE TO TREATMENT:  Pt encouraged by her tolerance to PT today, she was able to participate more in PT than she had previously thought possible. Reported improved confidence following PT today.     PATIENT LEVEL OF UNDERSTANDING OF EDUCATION PROVIDED: Pt provided education on over the counter remidies to help with overall constipation. Recommended prunes and prune juice in combonation with her medication to help with this. Patient able to verbalze and teach back understanding of all educaiton provided today.     ASSESSMENT OF PROGRESS TOWARD GOALS: Patient was seen for  T follow up session for LE strengthening, gait training and transfer training. sit to stand transfer completed from commode to stance with patient completing 20 reps showing improved muscle strength and endurance as evidence by progression to 20 reps as patient previously only able to perform 10 reps to fatigue. Stairs completed today up and down 5 steps with reciprical pattern going up and step to pattern going down. Required cuing for proper and safe completion and sequencing. Patient completed with need for SBA needed for safety.    HOME EXERCISE PROGRAM: Patient given verbal instructions on HEP and how often to complete to maintain compliance. WIth intermitten gait training to be completed throughout the day.     THE FOLLOWING DISCHARGE PLANNING WAS DISCUSSED WITH THE  PATIENT/CAREGIVER: Patient to be D/C from [facility] PT when all goals have been met or progressed well towards.     PLAN FOR NEXT VISIT: Cont PT PoC with focus on progression of LE strengthening, gait training and transfer training. Assess patients tolerance to outdoor gait training.

## 2023-02-12 ENCOUNTER — Encounter: Admit: 2023-02-12 | Discharge: 2023-02-12 | Payer: MEDICARE | Primary: Internal Medicine

## 2023-02-12 NOTE — Home Health (Signed)
 SUBJECTIVE: Patient states that she is doing well today. Reports no new complaints or concerns. Denies any falls or changes in overall status at this time.     CAREGIVER INVOLVEMENT/ASSISTANCE NEEDED FOR: Patient lives with her daughter in a two story home on the bottom floor. She currently requires assistance for completion of most to all ADL's such as cooking, cleaning, bathing and dressing.     OBJECTIVE:  See interventions.    PATIENT RESPONSE TO TREATMENT:  Pt encouraged by her tolerance to PT today, she was able to participate more in PT than she had previously thought possible. Reported improved confidence following PT today.     PATIENT LEVEL OF UNDERSTANDING OF EDUCATION PROVIDED: Pt verbalized understanding of all education provided during session today.     ASSESSMENT OF PROGRESS TOWARD GOALS: Patient was seen for  T follow up session for LE strengthening, gait training and transfer training. Gait completed outdoors today >520ft with use of FWW on level surfaces. Patient completed with need for SBA as this her first time she has completed gait outdoors today. No loss of balance evident during gait today, without rest break required. Stairs completed to enter and exit home with use of handrail and FWW on 2 steps. She required SBA and verbal cuing for proper sequencing.    HOME EXERCISE PROGRAM: Patient given verbal instructions on HEP and how often to complete to maintain compliance. WIth intermitten gait training to be completed throughout the day.     THE FOLLOWING DISCHARGE PLANNING WAS DISCUSSED WITH THE PATIENT/CAREGIVER: Patient to be D/C from [facility] PT when all goals have been met or progressed well towards.     PLAN FOR NEXT VISIT: Cont PT PoC with focus on progression of LE strengthening, gait training and transfer training.

## 2023-02-12 NOTE — Home Health Plan of Care Certification Statement (Signed)
 I certify that this patient is confined to his/her home and needs intermittent skilled nursing care, physical therapy and/or speech therapy or continues to need occupational therapy.  The patient is under my care, and I have authorized services on this plan of care and will periodically review the plan.  The patient had a face-to face encounter with an allowed provider type on 02/01/23 and the encounter was related to the primary reason for home health care.

## 2023-02-12 NOTE — Unmapped (Signed)
Code Status: Full Code

## 2023-02-12 NOTE — Home Health (Signed)
 SUBJECTIVE: Patient reports she is doing better these days, due to her paticiapting in therapy in her home  .   CAREGIVER INVOLVEMENT/ASSISTANCE NEEDED FOR: The pt family helps with all I/ADLS due to patient's inability to do so.   SABRA  HOME HEALTH SUPPLIES BY TYPE AND QUANTITY ORDERED/DELIVERED THIS VISIT INCLUDE: NONE   .  OBJECTIVE:  See interventions.    .  Patient education provided this visit: Education primary focus on this visit was the use of body mechanics with IADLS with use of energy conservation techniques.COTA also provided education on MD precautions and the benefits.   .    Patient level of understanding of education provided: Patient was able to teach-back education provided from COTA,while requiring multiple verbal cuing form COTA for re-enforcement of [place] stance when performing functional balance retraining tasks with use of an IADL plan.    .  RESPONSE TO TREATMENT: Pt able to demonstrate Plan of care addressing IADLS task with use of energy conservation techniques; while requiring breaks for balance and IADL tasks and trafers  (Please see interventions for more details of goal previously mentioned)   .   ASSESSMENT OF PROGRESS TOWARD GOALS((Please see interventions for more details)): Pt able to to perform IADL tasks with SBA with use of energy conservation techniques and pt able to perfrom functional transfers with SBA.  It is recommended that the patient continue to adhere to her HEP address BUE strength and endurance retraining to continue to build strength in her BUE to successfully conduct funcitonal transfers in her home setting.Patient reported pain in her left right it is being addressed by pain meds and follow up visits with MD. Even though pain level today is a 6/10, it did not affect ability to perform therapy tasks.(Please see pain tab for more details.)       .  CONTINUED NEED FOR THE FOLLOWING SKILLS: HH OT is medically necessary to address pain, decreased functional strength,  decreased independence and safety with functional transfers, decreased independence and safety performing ADL/IADL tasks, decreased activity and standing tolerance, decreased functional endurance, and impaired balance in order to improve functional independence, obtain set goals, reduce risk of falls, reduce pain, improve quality of life, and return to PLOF.    SABRA  PLAN FOR NEXT VISIT: IZELLA will address functional transfers and ADLS   .  THE FOLLOWING DISCHARGE PLANNING WAS DISCUSSED WITH THE PATIENT/CAREGIVER: Tentative d/c 2w2

## 2023-02-15 ENCOUNTER — Ambulatory Visit: Payer: MEDICARE | Attending: Orthopaedic Surgery | Primary: Internal Medicine

## 2023-02-15 ENCOUNTER — Ambulatory Visit: Payer: MEDICARE | Primary: Internal Medicine

## 2023-02-15 ENCOUNTER — Encounter: Admit: 2023-02-15 | Discharge: 2023-02-15 | Payer: MEDICARE | Primary: Internal Medicine

## 2023-02-15 NOTE — Home Health (Signed)
SUBJECTIVE: Patient states that she is doing well today. Reports no new complaints or concerns. Denies any falls or changes in overall status at this time. Reports that she had a rough night again last night. Having the most trouble getting to sleep and staying asleep. Reports no pain or discomfort upon arrival to session today.     CAREGIVER INVOLVEMENT/ASSISTANCE NEEDED FOR: Patient lives with her daughter in a two story home on the bottom floor. She currently requires assistance for completion of most to all ADL's such as cooking, cleaning, bathing and dressing.     OBJECTIVE:  See interventions.    PATIENT RESPONSE TO TREATMENT:  Pt challenged with new exercises today, noting increased fatigue post session. She completed all therex asked of her despite fatigue and remains well motivated.     PATIENT LEVEL OF UNDERSTANDING OF EDUCATION PROVIDED: Pt verbalized understanding of all education provided during session today.     ASSESSMENT OF PROGRESS TOWARD GOALS: Patient was seen for  T follow up session for LE strengthening, gait training and transfer training.Therex progressed to standing exercises to promote more functional weight bearing strengthening, PAtient tolerated well with ability to complete all asked of her. She completed all exercises for 10 reps with most challenge with mini squats due to B knee discomfort. PAtient noting increased fatigue post PT today.     HOME EXERCISE PROGRAM: Patient given verbal instructions on HEP and how often to complete to maintain compliance. WIth intermitten gait training to be completed throughout the day.     THE FOLLOWING DISCHARGE PLANNING WAS DISCUSSED WITH THE PATIENT/CAREGIVER: Patient to be D/C from [facility] PT when all goals have been met or progressed well towards.     PLAN FOR NEXT VISIT: Cont PT PoC with focus on progression of LE strengthening, gait training and transfer training.

## 2023-02-17 ENCOUNTER — Encounter: Admit: 2023-02-17 | Discharge: 2023-02-17 | Payer: MEDICARE | Primary: Internal Medicine

## 2023-02-17 NOTE — Home Health (Signed)
SUBJECTIVE: Patient states that she is doing well today. Reports no new complaints or concerns. Denies any falls or changes in overall status at this time. Reports that she had a rough night again last night. Having the most trouble getting to sleep and staying asleep. Reports no pain or discomfort upon arrival to session today.     CAREGIVER INVOLVEMENT/ASSISTANCE NEEDED FOR: Patient lives with her daughter in a two story home on the bottom floor. She currently requires assistance for completion of most to all ADL's such as cooking, cleaning, bathing and dressing.     OBJECTIVE:  See interventions.    PATIENT RESPONSE TO TREATMENT:  Pt challenged with new exercises today, noting increased fatigue post session. She completed all therex asked of her despite fatigue and remains well motivated.     PATIENT LEVEL OF UNDERSTANDING OF EDUCATION PROVIDED: Pt verbalized understanding of all education provided during session today.     ASSESSMENT OF PROGRESS TOWARD GOALS: Patient was seen for  T follow up session for LE strengthening, gait training and transfer training. Gait completed on unlevel surfaces outside >1020ft completed before rest break was required. Patient needing SBA with use of FWW for stable completion. Patient completed further distance today than compared to the previous sessions. Showing improvement in both distance and endruance today.     HOME EXERCISE PROGRAM: Patient given verbal instructions on HEP and how often to complete to maintain compliance. WIth intermitten gait training to be completed throughout the day.     THE FOLLOWING DISCHARGE PLANNING WAS DISCUSSED WITH THE PATIENT/CAREGIVER: Patient to be D/C from [facility] PT when all goals have been met or progressed well towards.     PLAN FOR NEXT VISIT: Cont PT PoC with focus on progression of LE strengthening, gait training and transfer training.

## 2023-02-17 NOTE — Home Health (Signed)
SUBJECTIVE: I didn't know how to use that stick to get dressed.    CAREGIVER INVOLVEMENT/ASSISTANCE NEEDED FOR: The patient's daughter assists with ADL/IADLs as needed.  The daughter was present.  .  OBJECTIVE: See interventions.  PATIENT EDUCATION PROVIDED THIS VISIT: Treatment focused on functional transfers, compensatory LB dressing techniques/adaptive equipment use and BUE strengthening .    PATIENT RESPONSE TO EDUCATION PROVIDED: The patient was trained in techniques/adaptive equipment use to increase independence with LB ADLS: following the training the patient donned socks with the sock-aid and SBA, and donned pants with a dressing stick with SBA and verbal cues for technique.  The patient was trained in B UE strengthening against gravity for shoulder flexion/extension, overhead press, chest press, ab/adduction and elbow flexion/extension to increase functional activity tolerance: following the training the patient performed the strengthening exercises with SBA.  The patient was trained on safe transfers using  appropriate body mechanics and the RW: following the training the patient performed sit to stands from the couch and the edge of the bed with Close supervision using the RW.    PATIENT RESPONSE TO TREATMENT: The patient tolerated treatment well, maintaining vital signs within acceptable parameters and without complaints of increased pain level.  .  ASSESSMENT OF PROGRESS TOWARD GOALS: The patient is making progress with LB dressing using adaptive equipment and functional transfers.  [name]  PLAN FOR NEXT VISIT: focus on HEP and standing balance.    THE FOLLOWING DISCHARGE PLANNING WAS DISCUSSED WITH THE PATIENT/CAREGIVER: Discharge the patient to self and family, with HEP, when all goals are met or maximum potential has been reached.  There are 2 visits remaining.

## 2023-02-18 ENCOUNTER — Encounter: Payer: MEDICARE | Primary: Internal Medicine

## 2023-02-19 ENCOUNTER — Ambulatory Visit
Admit: 2023-02-19 | Discharge: 2023-02-19 | Payer: MEDICARE | Attending: Orthopaedic Surgery | Primary: Internal Medicine

## 2023-02-19 ENCOUNTER — Encounter: Admit: 2023-02-19 | Discharge: 2023-02-19 | Payer: MEDICARE | Primary: Internal Medicine

## 2023-02-19 VITALS — Resp 16 | Ht 62.0 in | Wt 140.0 lb

## 2023-02-19 DIAGNOSIS — M19012 Primary osteoarthritis, left shoulder: Principal | ICD-10-CM

## 2023-02-19 MED ORDER — TRIAMCINOLONE ACETONIDE 40 MG/ML IJ SUSP
40 | Freq: Once | INTRAMUSCULAR | Status: AC
Start: 2023-02-19 — End: 2023-02-19
  Administered 2023-02-19: 15:00:00 40 mg

## 2023-02-19 MED ORDER — LIDOCAINE HCL 1 % IJ SOLN
1 | Freq: Once | INTRAMUSCULAR | Status: AC
Start: 2023-02-19 — End: 2023-02-19
  Administered 2023-02-19: 15:00:00 3 mL via INTRA_ARTICULAR

## 2023-02-19 NOTE — Progress Notes (Signed)
[facility] AMBULATORY OFFICE NOTE    Patient: Elaine [name]                MRN: [id number]       SSN: [id number]  Date of Birth: [birth year]        AGE: 72 y.o.        SEX: female  Body mass index is 25.61 kg/m.    PCP: Thana Farr, MD  02/19/23    Chief Complaint: Left shoulder pain    HPI: Elaine [name] is a 72 y.o. female patient who returns today for her left shoulder.  She continues to have left shoulder pain and stiffness.  Decreased range of motion.  Pain with use.  Injection at her last visit gave her about 2 weeks of relief.    Past Medical History:   Diagnosis Date    Anemia     Asthma     Colon polyp     DDD (degenerative disc disease) 02/15/2009    Depression 12/18/2011    DVT (deep venous thrombosis) ([place]) 10/23/2009    Left leg:  No DVT.      Essential hypertension, benign     History of blood transfusion     History of echocardiogram 03/20/2004    EF 70%.  Borderline DDfx.  No significant valvular pathology.    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  Neg EKG on pharm stress test.    Thyroid disease     hypothyroidism       Family History   Problem Relation Age of Onset    Hypertension Sister         x3    Hypertension Mother     Cancer Mother     Heart Surgery Sister     Heart Disease Sister     Hypertension Paternal Uncle     Cancer Maternal Aunt         breast    Breast Cancer Maternal Aunt      Glaucoma Son     Heart Defect Son     Diabetes Paternal Uncle     Heart Attack Sister        Current Outpatient Medications   Medication Sig Dispense Refill    vitamin D 25 MCG (1000 UT) CAPS Take 1 capsule by mouth daily      vitamin C (ASCORBIC ACID) 500 MG tablet Take 1 tablet by mouth daily      Multiple Vitamins-Minerals (MULTIVITAMIN ADULTS 50+ PO) Take 1 tablet by mouth daily      apixaban (ELIQUIS) 2.5 MG TABS tablet Take 1 tablet by mouth 2 times daily 60 tablet 0    ondansetron (ZOFRAN-ODT) 8 MG TBDP disintegrating tablet Place 1 tablet under the tongue every 8 hours as  needed for Nausea or Vomiting 10 tablet 0    acetaminophen (TYLENOL) 500 MG tablet Take 2 tablets by mouth in the morning and 2 tablets at noon and 2 tablets in the evening. 180 tablet 0    pantoprazole (PROTONIX) 40 MG tablet Take 1 tablet by mouth daily (Patient taking differently: Take 1 tablet by mouth daily Taking twice daily) 30 tablet 0    amLODIPine (NORVASC) 5 MG tablet Take 1 tablet by mouth daily 90 tablet 3    Misc Natural Products (NEURIVA PO) Take 1 tablet by mouth in the morning and at bedtime      NONFORMULARY Take 1 tablet by mouth every evening Viviscal -1 tablet every evening for hair and scalp      gabapentin (NEURONTIN) 300 MG capsule Take 1 capsule by mouth 3 times daily for 180 days. Max Daily Amount: 900 mg (Patient taking differently: Take 1 capsule by mouth 3 times daily. Taking one in the morning and two in the evening) 270 capsule 1    potassium chloride (KLOR-CON M) 10 MEQ extended release tablet TAKE 1 TABLET EVERY DAY 90 tablet 3    levothyroxine (SYNTHROID) 100 MCG tablet Take 1 tablet by mouth daily 90 tablet 1    atorvastatin (LIPITOR) 40 MG tablet TAKE 1 TABLET EVERY DAY (NEED MD APPOINTMENT) 90 tablet 3    montelukast (SINGULAIR) 10 MG tablet Take 1 tablet by mouth every evening      albuterol sulfate HFA (PROVENTIL;VENTOLIN;PROAIR) 108 (90 Base) MCG/ACT inhaler Inhale 2 puffs into the lungs every 4  hours as needed for Wheezing or Shortness of Breath 18 g 5    cyanocobalamin 1000 MCG tablet Take 1 tablet by mouth every evening      ZINC PO Take 1 tablet by mouth daily      iron dextran complex (INFED) 50 MG/ML injection Infuse 19.5 mLs intravenously Pt gets these every other month      latanoprost (XALATAN) 0.005 % ophthalmic solution Place 1 drop into both eyes nightly      lidocaine (LIDODERM) 5 % Place 1 patch onto the skin every 24 hours 12 hours on and 12 hours off       Current Facility-Administered Medications   Medication Dose Route Frequency Provider Last Rate Last Admin    triamcinolone acetonide (KENALOG-40) injection 40 mg  40 mg Other Once Rollene Rotunda T, MD        lidocaine 1 % injection 3 mL  3 mL Intra-artICUlar Once Waynetta Pean, MD           Allergies   Allergen Reactions    Aspirin Other (See Comments)     GI distress,     Diphenhydramine Other (See Comments)     Muscle jerking    Pollen Extract      Other reaction(s): Not Reported This Time       Past Surgical History:   Procedure Laterality Date    COLONOSCOPY N/A 04/25/2020    SCREENING COLONOSCOPY performed by Sloan Leiter, MD at [facility] ENDOSCOPY    COLONOSCOPY  02-24-12    normal (Dr. 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 performed by Sloan Leiter, MD at [facility] ENDOSCOPY       Social History     Socioeconomic History    Marital status: Divorced     Spouse name: Not on file    Number of children: Not on file    Years of education: Not on file     Highest education level: Not on file   Occupational History    Not on file   Tobacco Use    Smoking status: Former     Current packs/day: 0.00     Average packs/day: 1 pack/day for 27.0 years (27.0 ttl pk-yrs)     Types: Cigarettes     Start date: 10/16/1977     Quit date: 04/25/2004     Years since quitting: 18.8     Passive exposure: Never    Smokeless tobacco: Never   Vaping Use    Vaping Use: Never used   Substance and Sexual Activity    Alcohol use: No    Drug use: Never    Sexual activity: Not Currently   Other Topics Concern    Not on file   Social History Narrative    Not on file     Social Determinants of Health     Financial Resource Strain: Low Risk  (01/04/2023)    Overall Financial Resource Strain (CARDIA)     Difficulty of Paying Living Expenses: Not hard at all   Food Insecurity: No Food Insecurity (01/08/2023)    Hunger Vital Sign     Worried About Running Out of Food in the Last Year: Never true     [place] in the Last Year: Never true   Transportation Needs: No Transportation Needs (02/02/2023)    OASIS A1250: Transportation     Lack of Transportation (Medical): No     Lack of Transportation (Non-Medical): No     Patient Unable or Declines to Respond: No   Physical Activity: Inactive (01/04/2023)    Exercise Vital Sign     Days of Exercise per Week: 0 days     Minutes of Exercise per Session: 0 min   Stress: No Stress Concern Present (01/04/2023)    [place]     Feeling of Stress : Not at all   Social Connections: Feeling Socially Integrated (02/02/2023)    OASIS D0700: Social Isolation     Frequency of experiencing loneliness or isolation: Never   Intimate Partner Violence: Not At Risk (01/04/2023)    Humiliation, Afraid, Rape, and Kick questionnaire     Fear of Current or Ex-Partner: No     Emotionally Abused: No     Physically Abused: No     Sexually Abused: No   Housing Stability: [place]  (01/08/2023)    [place]  Sign     Unable to Pay for Housing in the Last Year: No     Number of [place] in the Last Year: 1     Unstable Housing in the Last Year: No       REVIEW OF SYSTEMS:      No changes from previous review of systems unless noted.    PHYSICAL EXAMINATION:  Resp 16   Ht 1.575 m (5\' 2" )   Wt 63.5 kg (140 lb) Comment: pt deferred  BMI 25.61 kg/m   Body mass index is 25.61 kg/m.  GENERAL: Alert and oriented x3, in no acute distress.  HEENT: Normocephalic, atraumatic.    Shoulder Examination     R   L  ROM   FF  Full   90  ER  Full   20   IR  Full   Hip  Rotator Cuff Pain/Weakness   Supra  -   -   Infra  -   -   Subscap -   -  Crepitus  -   +  Effusion  -   -  Warmth  -   -   Erythema  -   -  Instability  -   -  AC Joint TTP  -   -  Clavicle   Deformity -   -   TTP  -   -  Proximal Humerus   Deformity -   -   TTP  -   -  Deltoid Strength 5   5  Biceps Strength 5   5  Biceps Deformity -   -  Biceps Groove Pain -   -  Impingement Sign -   -       IMAGING:  Imaging read by myself and interpreted as follows:      ASSESSMENT & PLAN  Diagnosis: Left shoulder osteoarthritis    Elaine [name] continues to have symptomatic left shoulder osteoarthritis.  We discussed the treatment options today.  I again recommended that she consider surgery however she recently had a hip surgery and is recovering from that and she would not like to have surgery again at this time which is understandable.  Therefore we will try another corticosteroid injection.  I told her I could not guarantee any more than 1 to 2 weeks of pain relief.  Will see how this does.  Follow-up as needed.  All of her and her daughter's questions about surgery and coverage were also entertained and answered.    Prescription medication management discussed with patient.     Plan was discussed in detail with patient, all questions were answered, and patient voiced understanding of plan.    [facility]  OFFICE PROCEDURE PROGRESS NOTE        Chart  reviewed for the following:   I, Waynetta Pean, MD, have reviewed the History, Physical and updated the Allergic reactions for [place] performed immediately prior to start of procedure:   I, Waynetta Pean, MD, have performed the following reviews on [place] prior to the start of the procedure:            * Patient was identified by name and date of birth   * Agreement on procedure being performed was verified  * Risks and Benefits explained to the patient  * Procedure site verified and marked as necessary  * Patient was positioned for comfort  * Consent was signed and verified    Time: 10:54 AM    Location: Left shoulder glenohumeral joint injection    Kenalog 40mg  & 3cc Lidocaine    Date of procedure: 02/19/2023    Procedure performed by:  Waynetta Pean, MD    Provider assisted by: Office Staff Assistant    Patient assisted by: self    How tolerated by  patient: tolerated the procedure well with no complications    Post Procedural Pain Scale: 0 - No Hurt    Comments: none      Electronically signed by: Waynetta Pean, MD     Note: This note was completed using voice recognition software.  Any typographical/name errors or mistakes are unintentional.

## 2023-02-19 NOTE — Home Health (Signed)
SUBJECTIVE: "That cortisone shot in my shoulder still hurts!"    CAREGIVER INVOLVEMENT/ASSISTANCE NEEDED FOR: The patient's daughter assists with ADL/IADLs as needed.  The daughter was present.  .  OBJECTIVE: See interventions.  PATIENT EDUCATION PROVIDED THIS VISIT: Treatment focused on functional transfers, compensatory LB bathing/dressing techniques, standing balance/tolerance and HEP.    PATIENT RESPONSE TO EDUCATION PROVIDED: The patient is now performing UB dressing with supervision and LB dressing with SBA using a Adaptive equipment donning socks and pants.  The patient was able to verbalize understanding of the BUE strengthening program, due to a cortisone shot in her L shoulder on this date the patient wouldn't perform the exercises.  The patient was trained in safe transfers using appropriate body mechanics and RW for support: following the training the patient performs all functional transfers with SBA using a four wheeled walker  for support. Sit to stands from the the edge of bed, couch and commode are SBA.  The patient performed dynamic standing balance task reaching outside of base of support  without loss of balance for ~40 seconds and SBA.  The patient ambulates household distances with SBA using the four wheeled walker for support.    PATIENT RESPONSE TO TREATMENT: The patient tolerated treatment well, maintaining vital signs within acceptable parameters and without complaints of increased pain level.  .  ASSESSMENT OF PROGRESS TOWARD GOALS: The patient made good progress, meeting all the short term goals.  [name]  PLAN FOR NEXT VISIT: [facility] OT discharge.    THE FOLLOWING DISCHARGE PLANNING WAS DISCUSSED WITH THE PATIENT/CAREGIVER: Discharge the patient to self and family with HEP,  when all goals are met or maximum potential has been reached.  There is one visit remaining.

## 2023-02-22 ENCOUNTER — Encounter: Admit: 2023-02-22 | Discharge: 2023-02-22 | Payer: MEDICARE | Primary: Internal Medicine

## 2023-02-22 ENCOUNTER — Encounter

## 2023-02-22 NOTE — Progress Notes (Signed)
New PT order put in for outpatient at [facility] square in motion.

## 2023-02-22 NOTE — Telephone Encounter (Signed)
Patient called and is requesting an new order for out patient PT.          Please call and advise patient at   [phone]

## 2023-02-22 NOTE — Home Health (Signed)
SUBJECTIVE: Patient states that she is doing well today, notes that she has no new complaints or concerns. Deneis any falls or changes in overall status at this time.     CAREGIVER INVOLVEMENT/ASSISTANCE NEEDED FOR: Patient is currently reliant on assistance for completion of most to all ADL's such as cooking, cleaning, bathing and dressing.     OBJECTIVE:  See interventions.    PATIENT RESPONSE TO TREATMENT:  Pt encouraged by her tolerance to PT today, she was able to participate more in PT than she had previously thought possible. Reported improved confidence following PT today.     PATIENT LEVEL OF UNDERSTANDING OF EDUCATION PROVIDED: Pt and caregiver provided education today on D/C planning and expectations from outpatient PT. All questions answered upon leaving todays session. Both pt and caregiver verbalized understanding of all above.     ASSESSMENT OF PROGRESS TOWARD GOALS: Strength/ROM- MMT L LE hip flex 4/5, hip abd 4/5, hip ext 4/5, knee flex 4/5, knee ext 4/5  ADL's- Pt currently Independent with ADL's such as cooking, cleaning and dressing.   Functional Mobiliy- Pt currently able to complete gait training indoors and outdoors with MOD I and with use of FWW AD on level and unelevel srufaces for distances greater than 518ft. Stair Mobility pt demonstrates MOD I with use of single hand rail to enter and exit home. Demonstrates MOD I with transfers such as car, sit to stands, bed mobility and toilet transfers.   Special tests- 5x sit to stand- 11 secs without use of B UE  TUG- 18secs   tinetti- 23/28  45min walk test- able with use of FWW >592ft  Reccomendations- D/C from [facility] PT and transition to OP PT as pt has met all goals at this timeVE:     HOME EXERCISE PROGRAM: Patient has written and verbal instructions on HEP and how often to complete to maintain overall compliance. With gait training to be completed periodically throughout the day.     THE FOLLOWING DISCHARGE PLANNING WAS DISCUSSED WITH THE  PATIENT/CAREGIVER: Patient to be seen by supervising PT next visit and at that time expect D/C from [facility] PT unless given further instruction from supervising PT. Patient in agreement with this and all questions were answered in regards to this.    PLAN FOR NEXT VISIT: D/C from [facility] PT and progress to OP PT.

## 2023-02-23 ENCOUNTER — Encounter: Payer: MEDICARE | Primary: Internal Medicine

## 2023-02-23 NOTE — Telephone Encounter (Signed)
Last ov:01/04/2023  Next ov:03/01/2023  Last refill:09/01/2022

## 2023-02-24 ENCOUNTER — Encounter: Admit: 2023-02-24 | Discharge: 2023-02-24 | Payer: MEDICARE | Primary: Internal Medicine

## 2023-02-24 ENCOUNTER — Encounter: Payer: MEDICARE | Primary: Internal Medicine

## 2023-02-24 NOTE — Home Health (Signed)
SUBJECTIVE: Pt reports no pain on this date. Pt daughter present throughout occupational therapy session    CAREGIVER INVOLVEMENT/ASSISTANCE NEEDED FOR: Pt daughter assists with all IADLs as needed    HOME HEALTH SUPPLIES BY TYPE AND QUANTITY ORDERED/DELIVERED THIS VISIT INCLUDE: N/A    OBJECTIVE: See interventions    PATIENT RESPONSE TO TREATMENT: Pt responded well to occupational therapy session on this date with no increase in pain throughout    PATIENT LEVEL OF UNDERSTANDING OF EDUCATION PROVIDED: Pt educated on continuing to complete BUE strengthening HEP to maintain/increase strength needed to complete ADLs, IADLs, and functional mobility. Pt educated on continuing to use energy conservation strategies within their daily routine to decrease fatigue and risk of falls. Pt educated on notifying MD of any changes in health status.    OCCUPATIONAL THERAPY DISCHARGE: Mrs. Tindell has been seen by skilled home health occupational therapy services to address deficits in ADLs, BUE function, functional mobility, and balance. Pt has met all goals and is ready/agreeable to discharge at this time. ADLs: Pt have been educated on adaptive techniques and equipment to increase pt safety with completion of ADLs. Pt has progressed to completing lower body dressing with SBA. Pt has progressed to completing upper body dressing with supervision. BUE function: Pt has been provided with BUE strengthening HEP to increase strength needed ot complete her daily routine. Pt has progressed to completing HEP with supervision using handout only. Functional Mobility: Pt has progressed to completing all functional transfers from bed, couch, and toilet with SBA. Balance: Pt has progressed to fair dynamic standing balance while completing her daily routine. Pt medications have been reconciled. Pt MD notified of [place] discharge at this time.

## 2023-02-25 ENCOUNTER — Encounter: Payer: MEDICARE | Primary: Internal Medicine

## 2023-02-26 ENCOUNTER — Encounter: Admit: 2023-02-26 | Discharge: 2023-02-26 | Payer: MEDICARE | Primary: Internal Medicine

## 2023-02-26 MED ORDER — MONTELUKAST SODIUM 10 MG PO TABS
10 MG | ORAL_TABLET | Freq: Every day | ORAL | 3 refills | Status: DC
Start: 2023-02-26 — End: 2023-12-20

## 2023-02-26 NOTE — Home Health (Signed)
PT THR DC summary:    Pt is a 72 y/o  female who is  s/p L THR  by Dr. Abran Richard on 01/08/23 .    S:  Pt has pain on L hip on and off but mostly off.  Pt denies falls nor change in medication.  O:    Medications reconciled and list updated.   Integumentary:  L hip incision = newly epithelialized.  Intact skin on gluteal cleft.   Goal met.  Pain: L   hip :   0-7 /10.   Ind pain management by patient/ caregiver.  Goal met.   Pain management instructions:  ice x 20 mins to R hip for pain and edema control.   bed mobiilty = ind.  Goal met    Transfers = sit <> stand from multiple surfaces, including car and shower transfers = ind with walker. Goal achieved.    GAit: > 500  ft   ind  with FWW  indoors, SBA for outdoors for safety, exhibiting good gait pattern.   Goal achieved.   6MWT:  >500   ft  with FWW, indicating good walking endurance.   Ingress/egress to home:   SBA via front entry steps holding on to rollator.  Pt is able to manage her rollator  up and down the steps.  Goal met.   TUG:  18 seconds with FWW.  Pt education:  use sneaker type shoes instead of slippers,   HEP:  ind.  Goal met    Pt instructed to continue doing HEP and ambulation program;   Patient level of understanding of education provided: verbalized.  She is able to verbalize and comply with THR precautions.  Patient response to treatment:  tolerated well.  Caregiver involvement/assistance needed: daughter Lawson Radar and family (name of caregiver) assists with self care, ADLs, iADLs, functional mobility, transportation.   A:  Pt has made significant progress with PT in  bed mob, transfers and gait.  Pt has  met PT goals and is ready to start with OP PT.    P:  DC PT and agency and pt agreed.  F/u visit with Dr. Abran Richard is on 03/01/23.  [facility] eval visit is on 03/03/23.

## 2023-03-01 ENCOUNTER — Ambulatory Visit
Admit: 2023-03-01 | Discharge: 2023-03-01 | Payer: MEDICARE | Attending: Orthopaedic Surgery | Primary: Internal Medicine

## 2023-03-01 ENCOUNTER — Ambulatory Visit: Admit: 2023-03-01 | Discharge: 2023-03-01 | Payer: MEDICARE | Attending: Internal Medicine | Primary: Internal Medicine

## 2023-03-01 VITALS — Ht 62.0 in | Wt 148.0 lb

## 2023-03-01 DIAGNOSIS — Z96642 Presence of left artificial hip joint: Principal | ICD-10-CM

## 2023-03-01 DIAGNOSIS — R7303 Prediabetes: Secondary | ICD-10-CM

## 2023-03-01 DIAGNOSIS — I10 Essential (primary) hypertension: Secondary | ICD-10-CM

## 2023-03-01 LAB — AMB POC HEMOGLOBIN A1C: Hemoglobin A1C, POC: 5 %

## 2023-03-01 NOTE — Progress Notes (Signed)
Elaine [name] presents today for   Chief Complaint   Patient presents with    Hypertension     Follow up            "Have you been to the ER, urgent care clinic since your last visit?  Hospitalized since your last visit?"    NO    "Have you seen or consulted any other health care providers outside of [place] since your last visit?"    NO

## 2023-03-01 NOTE — Progress Notes (Signed)
Patient: Elaine [name]                MRN: [id number]       SSN: [id number]  Date of Birth: 1951-06-07        AGE: 72 y.o.        SEX: female    PCP: Thana Farr, MD  03/01/23    Chief Complaint: Hip Pain and Post-Op Check (Left total hip arthroplasty, posterior lateral approach, January 08, 2023, )      1. Status post total replacement of left hip  2. Orthopedic aftercare  3. Chronic diastolic congestive heart failure (HCC)      HPI:  Elaine [name] is a 72 y.o. female with chief complaint of   Chief Complaint   Patient presents with    Hip Pain     Lt      -Left total hip arthroplasty, posterior lateral approach, January 08, 2023, Dr. Garner Nash    We have been planning on a left total hip arthroplasty through the anterolateral approach due to some pain in the lateral hip as well as a overhanging pannus.  She recently, in July, had a GI bleed and has had a cascade of medical issues since then.  She is hoping that she can have her hip replaced at the beginning of the year.        03/01/2023    10:36 AM   AMB PAIN ASSESSMENT   Location of Pain Hip   Location Modifiers Left   Severity of Pain 0     Tobacco Use: Medium Risk (03/01/2023)    Patient History     Smoking Tobacco Use: Former     Smokeless Tobacco Use: Never     Passive Exposure: Never     IMAGING:  Imaging read by myself and interpreted as follows:    February 01, 2023:  Three-view x-ray of the left hip including AP pelvis and AP and lateral of the left hip demonstrates maintenance of the position of the implants without change in position or evidence of subsidence or fracture.  No evidence of complication.  The left leg is approximately centimeter longer than the right with mildly increased offset.      January 18, 2023:  3 view x-ray of the left hip including AP pelvis, AP and lateral, demonstrates a well positioned left total hip arthroplasty without signs of loosening or complication. Leg lengths appear to be equal.     Dec 08, 2022:  3 view  x-ray of the left hip including AP pelvis, and AP and lateral demonstrating severe joint space narrowing with evidence of osteophytosis, subchondral sclerosis, and subchondral cyst formation.     2 view x-ray of the lumbar spine, sitting and standing, demonstrates facet joint arthropathy and decreased disc space at multiple levels. The change in SS is 21.2.     September 25, 2022:  3 view x-ray of the left hip including AP pelvis and AP and crosstable lateral of the left hip demonstrate significant joint space loss acetabular joint of approximately 90%.  There is osteophytosis,  subchondral sclerosis and subchondral cyst formation.      PHYSICAL EXAMINATION:  Ht 1.575 m (5\' 2" )   Wt 67.1 kg (148 lb)   BMI 27.07 kg/m   Body mass index is 27.07 kg/m.  Wt Readings from Last 3 Encounters:   03/01/23 67.1 kg (148 lb)   03/01/23 65.8 kg (145 lb)   02/19/23 63.5 kg (140 lb)  GENERAL: Alert and oriented x3, in no acute distress.  HEENT: Normocephalic, atraumatic.    MSK: Left Hip Exam     Tenderness   The patient is experiencing no tenderness.     Range of Motion   Flexion:  110   External rotation:  30   Internal rotation: 10     Muscle Strength   Abduction: 5/5   Adduction: 5/5   Flexion: 5/5     Other   Erythema: absent  Scars: present  Sensation: normal  Pulse: present    Comments:  Incision consistent with posterior lateral approach is clean dry and intact.  No erythema and no tenderness to palpation.  Well-healed        ASSESSMENT & PLAN:    March 01, 2023:  6-week status post left total hip arthroplasty through posterior lateral approach.  She is doing very well and she is very happy.  She is not having any pain and she is able to ambulate short distances without her rollator but is not completely steady on her feet yet.  She has graduated from home physical therapy and is starting outpatient physical therapy later this week.  I like to see her again in 2 months to check on her progress.  No x-rays needed at  that visit.      February 01, 2023:  3 and half weeks status post left total hip arthroplasty through the posterior lateral approach.  Patient had a fall about 4 days ago when she slipped on the hardwood floor wearing socks without grippers on the.  She reports that she is feeling much better and she is ambulating well today.  She is complaining more of left shoulder pain than her hip.  She has seen Dr. Guilford Shi in the past and they urged her to make a new appointment with him.  I will have home health work with the patient for the next 2 weeks as well as do a home assessment.  If she needs further physical therapy following those 2 weeks we can order outpatient physical therapy.  She does have a small abrasion in the gluteal cleft which is not concerning and seems to be healing well.  I will see her in 6 weeks and no x-rays will be needed unless there is any increasing pain.    January 18, 2023:  10 days s/p left THA, lateral approach. Pt is continuing to improve. Her pain fluctuates based off of her activity level. She was getting up and walking 8-10 steps every hour but this started to increase her pain so they have cut it back to every 2 hours but continue to do exercise while she is seated. She had an allergic reaction to the oxycodone that was given to her post op and she developed intense itching. She stopped the oxycodone once this started and I prescribed her hydroxyzine due to her allergy to benadryl. The itching has subsided since. She is currently taking tramadol for her pain every 8 hours along with tylenol and reports that this is helping with her pain. She is also taking the eliquis prescribed to her due to her aspirin allergy. She denies calf pain and has no calf pain, swelling, and negative homan's on exam. Her incision is clean, dry, and intact without ttp or erythema. She states that most of her pain is around her incision. She continues to walk with a walker for support. We will have her follow  up with Dr. Garner Nash on 4 weeks.  She also asked for lidocaine patches today, which were prescribed, and I will refill her tramadol when it is due this coming Friday.       December 08, 2022:  Primary osteoarthritis of the left hip. Pt is here for her H&P appointment prior to her 12/23/22 THA. 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 She has been cleared by her PCP for surgery and will be following up with her hematologist prior to her surgery for clearance due to her chronic iron deficiency anemia and low hemoglobin numbers on her last labs. They are out of the [place] network, so they will be faxing that over once completed. She receives iron infusions every other month and had her last one in November.     She has allergies to aspirin and diphenhydramine. Due to this she will get lovenox 30 mg as DVT prophylaxis. Due to her history of GI bleed, we will not prescribe meloxiam. I will order her post operative medications once she receives clearance from hematology.     September 25, 2022:  We will continue to plan on doing a left total hip arthroplasty through an anterolateral approach.  Again addressed the benefits and risks of anterolateral versus direct anterior the patient would like to continue the anterior lateral plan.  She does need to improve her health and get stabilized with recent GI bleed.  We will hope to move forward after the first of the year.    Apr 20, 2022:  She failed conservative treatment with Tylenol, ibuprofen and gabapentin.  She did get a left hip corticosteroid injection intra-articularly which gave her only a month of greater than 90% relief of her pain.  At this point would like to go forward with a left total hip replacement through the anterolateral approach.  I did discuss with her the risks and benefits of a direct anterior versus anterolateral approach and  she did agree to go forward with anterolateral approach.  She has an overhanging pannus that reaches down the thigh quite a bit.  She is currently working on adjusting her thyroid medications to get her hypothyroidism under control.  Her other medical comorbidities under medication control at this time.  She is currently working in childcare and would like to go forward with surgery in July as her last day of work is the end of June.      Past Medical History:   Diagnosis Date    Anemia     Asthma     Colon polyp     DDD (degenerative disc disease) 02/15/2009    Depression 12/18/2011    DVT (deep venous thrombosis) ([place]) 10/23/2009    Left leg:  No DVT.      Essential hypertension, benign     History of blood transfusion     History of echocardiogram 03/20/2004    EF 70%.  Borderline DDfx.  No significant valvular pathology.    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  Neg EKG on pharm stress test.    Thyroid disease     hypothyroidism       Family History   Problem Relation Age of Onset    Hypertension Sister         x3    Hypertension Mother     Cancer Mother     Heart Surgery Sister     Heart Disease Sister     Hypertension Paternal Uncle     Cancer Maternal Aunt         breast    Breast Cancer Maternal Aunt     Glaucoma Son     Heart Defect Son     Diabetes Paternal Uncle     Heart Attack Sister        Current Outpatient Medications   Medication  Sig Dispense Refill    montelukast (SINGULAIR) 10 MG tablet TAKE 1 TABLET EVERY DAY 90 tablet 3    vitamin D 25 MCG (1000 UT) CAPS Take 1 capsule by mouth daily      vitamin C (ASCORBIC ACID) 500 MG tablet Take 1 tablet by mouth daily      Multiple Vitamins-Minerals (MULTIVITAMIN ADULTS 50+ PO) Take 1 tablet by mouth daily      amLODIPine (NORVASC) 5 MG tablet Take 1 tablet by mouth daily 90 tablet 3    Misc Natural Products (NEURIVA PO) Take 1 tablet by mouth in the morning and at bedtime      NONFORMULARY Take 1 tablet by mouth every evening Viviscal -1 tablet every evening for hair and scalp      gabapentin (NEURONTIN) 300 MG capsule Take 1 capsule by mouth 3 times daily for 180 days. Max Daily Amount: 900 mg (Patient taking differently: Take 1 capsule by mouth 3 times daily. Taking one in the morning and two in the evening) 270 capsule 1    potassium chloride (KLOR-CON M) 10 MEQ extended release tablet TAKE 1 TABLET EVERY DAY 90 tablet 3    levothyroxine (SYNTHROID) 100 MCG tablet Take 1 tablet by mouth daily 90 tablet 1    atorvastatin (LIPITOR) 40 MG tablet TAKE 1 TABLET EVERY DAY (NEED MD APPOINTMENT) 90 tablet 3    albuterol sulfate HFA (PROVENTIL;VENTOLIN;PROAIR) 108 (90 Base) MCG/ACT inhaler Inhale 2 puffs into the lungs every 4 hours as needed for Wheezing or Shortness of Breath 18 g 5    cyanocobalamin 1000 MCG tablet Take 1 tablet by mouth every evening      ZINC PO Take 1 tablet by mouth daily      iron dextran complex (INFED) 50 MG/ML injection Infuse 19.5 mLs intravenously Pt gets these every other month      latanoprost (XALATAN) 0.005 % ophthalmic solution Place 1 drop into both eyes nightly      lidocaine (LIDODERM) 5 % Place 1 patch onto the skin every 24 hours 12 hours on and 12 hours off      acetaminophen (TYLENOL) 500 MG tablet Take 2 tablets by mouth in the morning and 2 tablets at noon and 2 tablets in the evening. 180 tablet 0     No current facility-administered medications for this  visit.  Allergies   Allergen Reactions    Aspirin Other (See Comments)     GI distress,     Diphenhydramine Other (See Comments)     Muscle jerking    Pollen Extract      Other reaction(s): Not Reported This Time       Past Surgical History:   Procedure Laterality Date    COLONOSCOPY N/A 04/25/2020    SCREENING COLONOSCOPY performed by Sloan Leiter, MD at [facility] ENDOSCOPY    COLONOSCOPY  02-24-12    normal (Dr. Angelique Holm)    COLONOSCOPY FLX DX W/COLLJ [facility] WHEN P[facility]MD  02-2007    +polyp(tubular adenoma); Dr Angelique Holm    HERNIA REPAIR      3x    HYSTERECTOMY ([place])      NEUROLOGICAL SURGERY  03-16-2009    s/p ACD & Fusion (Dr.P.Gurtner)    TOTAL HIP ARTHROPLASTY Left 01/08/2023    LEFT HIP TOTAL ARTHROPLASTY LATERAL APPROACH performed by Merdis Delay, DO at [place] ENDOSCOPY N/A 06/12/2022    ENTEROSCOPY PUSH DIAGNOSTIC w/small intestinal bipolar cautery of multiple AVMs performed by Sloan Leiter, MD at [facility] ENDOSCOPY       Social History     Socioeconomic History    Marital status: Divorced     Spouse name: Not on file    Number of children: Not on file    Years of education: Not on file    Highest education level: Not on file   Occupational History    Not on file   Tobacco Use    Smoking status: Former     Current packs/day: 0.00     Average packs/day: 1 pack/day for 27.0 years (27.0 ttl pk-yrs)     Types: Cigarettes     Start date: 10/16/1977     Quit date: 04/25/2004     Years since quitting: 18.8     Passive exposure: Never    Smokeless tobacco: Never   Vaping Use    Vaping Use: Never used   Substance and Sexual Activity    Alcohol use: No    Drug use: Never    Sexual activity: Not Currently   Other Topics Concern    Not on file   Social History Narrative    Not on file     Social Determinants of Health     Financial Resource Strain: Low Risk  (01/04/2023)    Overall Financial Resource Strain (CARDIA)     Difficulty of Paying Living Expenses: Not  hard at all   Food Insecurity: No Food Insecurity (01/08/2023)    Hunger Vital Sign     Worried About Running Out of Food in the Last Year: Never true     [place] in the Last Year: Never true   Transportation Needs: No Transportation Needs (02/26/2023)    OASIS A1250: Transportation     Lack of Transportation (Medical): No     Lack of Transportation (Non-Medical): No     Patient Unable or Declines to Respond: No   Physical Activity: Inactive (01/04/2023)    Exercise Vital Sign     Days of Exercise per Week: 0 days     Minutes of Exercise per Session: 0 min   Stress: No Stress Concern Present (01/04/2023)    [place]     Feeling of Stress : Not at  all   Social Connections: Feeling Socially Integrated (02/26/2023)    OASIS D0700: Social Isolation     Frequency of experiencing loneliness or isolation: Never   Intimate Partner Violence: Not At Risk (01/04/2023)    Humiliation, Afraid, Rape, and Kick questionnaire     Fear of Current or Ex-Partner: No     Emotionally Abused: No     Physically Abused: No     Sexually Abused: No   Housing Stability: Low Risk  (01/08/2023)    [place] Sign     Unable to Pay for Housing in the Last Year: No     Number of [place] in the Last Year: 1     Unstable Housing in the Last Year: No       REVIEW OF SYSTEMS:      No changes from previous review of systems unless noted.      Prescription medication management discussed with patient.     Electronically signed by: Merdis Delay, DO    Note: This note was completed using voice recognition software.  Any typographical/name errors or mistakes are unintentional.

## 2023-03-01 NOTE — Progress Notes (Signed)
Elaine [name] is a 72 y.o.[name]  female and presents with Hypertension, Cholesterol Problem, Thyroid Problem (/), and Vitamin D Deficiency      SUBJECTIVE:  [name] HTN is well controlled on   Norvasc 5  mg.  [name] high cholesterol has been borderline controlled on Lipitor 40 mg. Pt  denies any side effects like myalgias. Compliance with medications has been an issue.  Pt has been controlling her prediabetes with diet and weight loss.  Patient daughter has been helping her to be more compliant with taking her medications.  They will have updated blood work done in the next week.     Thyroid Review:  Patient is seen for followup of hypothyroidism.   Thyroid ROS: denies fatigue, weight changes, heat/cold intolerance, bowel/skin changes or CVS symptoms. 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  Patient will have updated TSH done    Patient has history of vitamin D deficiency and should be on vitamin D about 2000 units/day.  Will get updated blood work  Patient has sleep apnea and is using a CPAP machine and followed by sleep specialist    She continues to have fungal infection in her toenails which is slowly improving with Lamisil.     Patient has asthma that is controlled on Singulair and  trelegy Ellipta.  Patient is followed by pulmonary who also sees her for sleep apnea.      Patient has chronic diastolic heart failure which is stable and followed by cardiology    Patient has an atrophic right kidney which may have been going on for several years if not since birth.  Duplex renal ultrasound shows Eleveated velocity identified in the mid left renal artery in the presence of tortousity. No hemodynamically significant stenosis involving the right renal artery. Will refer to nephrology for an opinion.  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  She has had EGD/colonoscopy and pill endoscopy in the past.  Patient unable to use aspirin due to this reason.    Patient has a history of Chiari malformation type I and we will go ahead and refer her to neurology for follow-up.    Respiratory ROS: negative for - shortness of breath  Cardiovascular ROS: negative for - chest pain      Current Outpatient Medications   Medication Sig    montelukast (SINGULAIR) 10 MG tablet TAKE 1 TABLET EVERY DAY    vitamin D 25 MCG (1000 UT) CAPS Take 1 capsule by mouth daily    vitamin C (ASCORBIC ACID) 500 MG tablet Take 1 tablet by mouth daily    Multiple Vitamins-Minerals (MULTIVITAMIN ADULTS 50+ PO) Take 1 tablet by mouth daily    amLODIPine (NORVASC) 5 MG tablet Take 1 tablet by mouth daily    Misc Natural Products (NEURIVA PO) Take 1 tablet by mouth in the morning and at bedtime    NONFORMULARY Take 1 tablet by mouth every evening Viviscal -1 tablet every evening for hair and scalp    gabapentin (NEURONTIN) 300 MG capsule Take 1 capsule by mouth 3 times daily for 180 days. Max Daily Amount: 900 mg (Patient taking differently: Take 1 capsule by mouth 3 times daily. Taking one in the morning and two in the evening)  potassium chloride (KLOR-CON M) 10 MEQ extended release tablet TAKE 1 TABLET EVERY DAY    levothyroxine (SYNTHROID) 100 MCG tablet Take 1 tablet by mouth daily    atorvastatin (LIPITOR) 40 MG tablet TAKE 1 TABLET EVERY DAY (NEED MD APPOINTMENT)    albuterol sulfate HFA (PROVENTIL;VENTOLIN;PROAIR) 108 (90 Base) MCG/ACT inhaler Inhale 2 puffs into the lungs every 4 hours as needed for Wheezing or Shortness of Breath    cyanocobalamin 1000 MCG tablet Take 1 tablet by mouth every evening    ZINC PO Take 1 tablet by mouth daily    iron dextran complex (INFED) 50 MG/ML injection Infuse 19.5 mLs intravenously Pt gets these every other month    latanoprost (XALATAN) 0.005 % ophthalmic solution Place 1 drop  into both eyes nightly    lidocaine (LIDODERM) 5 % Place 1 patch onto the skin every 24 hours 12 hours on and 12 hours off    acetaminophen (TYLENOL) 500 MG tablet Take 2 tablets by mouth in the morning and 2 tablets at noon and 2 tablets in the evening.     No current facility-administered medications for this visit.           OBJECTIVE:  alert, well appearing, and in no distress    Visit Vitals  BP 132/64   Pulse 76   Resp 18   Ht 1.575 m (5\' 2" )   Wt 65.8 kg (145 lb)   BMI 26.52 kg/m          well developed and well nourished  Chest - clear to auscultation, no wheezes, rales or rhonchi, symmetric air entry  Heart - normal rate, regular rhythm, normal S1, S2, no murmurs, rubs, clicks or gallops  Extremities - peripheral pulses normal, no pedal edema, no clubbing or cyanosis    CMP:    Lab Results   Component Value Date/Time    NA 142 12/02/2022 09:35 AM    K 4.0 12/02/2022 09:35 AM    CL 106 12/02/2022 09:35 AM    CO2 33 12/02/2022 09:35 AM    BUN 5 12/02/2022 09:35 AM    CREATININE 0.54 12/02/2022 09:35 AM    GFRAA >60 05/26/2021 08:05 AM    AGRATIO 1.2 12/02/2022 09:35 AM    AGRATIO 1.4 09/29/2022 12:00 AM    LABGLOM >60 12/02/2022 09:35 AM    LABGLOM 97 09/29/2022 12:00 AM    GLUCOSE 82 12/02/2022 09:35 AM    GLUCOSE 95 09/29/2022 12:00 AM    PROT 6.3 12/02/2022 09:35 AM    LABALBU 3.4 12/02/2022 09:35 AM    CALCIUM 9.4 12/02/2022 09:35 AM    BILITOT 0.6 12/02/2022 09:35 AM    ALKPHOS 79 12/02/2022 09:35 AM    ALKPHOS 91 09/29/2022 12:00 AM    AST 12 12/02/2022 09:35 AM    ALT 22 12/02/2022 09:35 AM     HgBA1c:    Lab Results   Component Value Date/Time    LABA1C 5.0 12/02/2022 09:35 AM     FLP:    Lab Results   Component Value Date/Time    TRIG 101 09/29/2022 12:00 AM    HDL 51 09/29/2022 12:00 AM    LDLCALC 83 09/29/2022 12:00 AM     TSH:    Lab Results   Component Value Date/Time    TSH 0.018 09/29/2022 12:00 AM         Discussed the patient's BMI with her.  The BMI follow up plan is as follows: I have  counseled this patient on diet and exercise regimens.        Assessment/Plan    ICD-10-CM    1. Essential (primary) hypertension  I10 Well-controlled Norvasc 5 mg daily      2. High cholesterol  E78.00 Normally controlled on Lipitor 40 mg daily.  Patient to have updated lipid profile done      3. Prediabetes  R73.03 AMB POC HEMOGLOBIN A1C appears controlled with diet but could be affected by low hemoglobin with a history of anemia      4. Postprocedural hypothyroidism  E89.0 Status unknown on Synthroid 100 mcg daily.  Patient have updated TSH done      5. Iron deficiency anemia due to chronic blood loss  D50.0 Patient to have updated CBC done on follow-up with hematology      6. Renal artery stenosis (HCC)  I70.1 Amb External Referral To Nephrology for further recommendations      7. Chiari malformation type I ([place])  G93.5 Amb External Referral To Neurology for further recommendations           Follow-up and Dispositions    Ms.Return in about 4 months (around 07/01/2023) for HV to do LABS.             Reviewed plan of care. Patient has provided input and agrees with goals.

## 2023-03-03 ENCOUNTER — Encounter: Payer: MEDICARE | Primary: Internal Medicine

## 2023-03-03 DIAGNOSIS — M25552 Pain in left hip: Secondary | ICD-10-CM

## 2023-03-03 NOTE — Progress Notes (Signed)
PHYSICAL / OCCUPATIONAL THERAPY - DAILY TREATMENT NOTE (updated 1/23)  For Eval visit    Patient Name: Elaine [name]    Date: 03/03/2023    DOB: July 27, 1951  Insurance: Payor: HU[employer]NA MEDICARE / Plan: HU[employer]NA GOLD PLUS HMO / Product Type: *No Product type* /      Patient DOB verified yes     Visit #   Current / Total 1 16   Time   In / Out 11:35 am 12:10 pm   Pain   In / Out 0 1   Subjective Functional Status/Changes: See POC     TREATMENT AREA =  see POC    OBJECTIVE      28 min   Eval - untimed                      Therapeutic Procedures:    Tx Min Billable or 1:1 Min (if diff from Tx Min) Procedure, Rationale, Specifics        7  97530 Therapeutic Activity (timed):  use of dynamic activities replicating functional movements to increase ROM, strength, coordination, balance, and proprioception in order to improve patient's ability to progress to PLOF and address remaining functional goals.  (see flow sheet as applicable)     Details if applicable:  HEP program and hip precautions discussion            Details if applicable:            Details if applicable:            Details if applicable:            Details if applicable:     7  MC BC Totals Reminder: bill using total billable min of TIMED therapeutic procedures (example: do not include dry needle or estim unattended, both untimed codes, in totals to left)  8-22 min = 1 unit; 23-37 min = 2 units; 38-52 min = 3 units; 53-67 min = 4 units; 68-82 min = 5 units   Total Total     [x]   Patient Education billed concurrently with other procedures   [x]  Review HEP    []  Progressed/Changed HEP, detail:    []  Other detail:       Objective Information/Functional Measures/Assessment    See POC    Patient will continue to benefit from skilled PT / OT services to modify and progress therapeutic interventions, analyze and address functional mobility deficits, analyze and address ROM deficits, analyze and address strength deficits, analyze and address soft tissue restrictions,  analyze and cue for proper movement patterns, and analyze and modify for postural abnormalities to address functional deficits and attain remaining goals.    Progress toward goals / Updated goals:  [x]   See POC    Short Term Goals: To be accomplished in  3-4 weeks:  1. Independent with HEP.  EVAL: N/A  2. Decrease max pain to <3/10 to assist with completion of ADLs and IADLs without significant pain.   EVAL: 9/10 max pain  3. Pt is able to negotiate stair with reciprocal gait pattern and 1 railing for return to PLOF.   EVAL: step to gait pattern with b/l railings.    Long Term Goals: To be accomplished in  6-8  weeks:  1.  Decrease max pain <1/10 to assist with patient able to walk community distances and return to riding her bike.  EVAL: 9/10 max pain  2.  Improve FOTO Functional Status Score by  11 points in order to show significant functional improvement.  EVAL: 44 points  3.  Improve B/L hip flexion and abduction and knee flexion and extension to at least 4/5 MMT to return to PLOF.   EVAL:  R/L Hip Strength: Flexion = 3/3+   Abduction = 3/3; R/L Knee Strength: Flexion = 4-/4-   Extension = 4-/4-  4. Pt is able to walk at least 300 ft without AD.  EVAL: unable to walk without SPC    Frequency / Duration:  Patient to be seen  1-2 times per week for 6-8  weeks:      Next PN due 04/03/23; RC 05/03/23  Auth due after EVAL    PLAN  yes Continue plan of care  []   Other:      Kara Pacer, PT    03/03/2023    5:47 PM    Future Appointments   Date Time Provider [place]   03/05/2023  2:10 PM Willaim Rayas, PTA [facility]   03/08/2023  2:10 PM Edman Circle, PTA [facility]   03/09/2023 10:15 AM IOC LAB VISIT [facility] AMB   03/10/2023  1:30 PM Smalls, Crystal, PTA [facility]   03/15/2023 11:30 AM Marvene Staff, PT [facility]   03/17/2023 11:30 AM Marvene Staff, PT [facility]   03/22/2023 11:30 AM Marvene Staff, PT [facility]   03/24/2023 11:30 AM Smalls, Crystal, PTA [facility]   05/03/2023  8:30 AM Merdis Delay, DO [facility] AMB   07/02/2023 11:40 AM Thana Farr, MD [place] [facility] AMB   09/16/2023 10:20 AM Lessie Dings, PA-C [facility] Viviano Simas

## 2023-03-03 NOTE — Other (Cosign Needed)
[facility]  [address] [place] [address]  [phone]  Plan of Care / Statement of Necessity for Physical Therapy Services     Patient Name: Elaine [name] DOB: 1951/04/13   Medical   Diagnosis: Pain in left hip [M25.552] Treatment Diagnosis: M25.552  LEFT HIP PAIN and M25.562  LEFT KNEE PAIN  and M54.59  OTHER LOWER BACK PAIN    Onset Date: 01/08/23     Referral Source: Mora Bellman Start of Care [facility]): 03/03/2023   Prior Hospitalization: See medical history Provider #: [id number]   Prior Level of Function: Independent - ADL and IADLs    Comorbidities: HTN, arthritis, chronic back pain, chiari malformation       Post operative: URGENT: Patient was evaluated for a post operative condition and early physical therapy intervention is critical to positive outcomes.  Insurance authorization should be expedited to prevent delay in treatment.      Evaluation Complexity:  History:  HIGH Complexity :3+ comorbidities / personal factors will impact the outcome/ POC ; Examination:  MEDIUM Complexity : 3 Standardized tests and measures addressin body structure, function, activity limitation and / or participation in recreation  ;Presentation:  MEDIUM Complexity : Evolving with changing characteristics  ;Clinical Decision Making:  MEDIUM Complexity : FOTO score of 26-74 FOTO score = an established functional score where 100 = no disability  Overall Complexity Rating: MEDIUM  Problem List: pain affecting function, decrease ROM, decrease strength, edema affection function, impaired gait/balance, decrease ADL/functional abilities, decrease activity tolerance, decrease flexibility/joint mobility, and decrease transfer abilities    Treatment Plan may include any combination of the following: 97110 Therapeutic Exercise, 97112 Neuromuscular Re-Education, 97140 Manual Therapy, 97530 Therapeutic Activity, 97535 Self Care/Home Management, and  97116 Gait Training  Patient / Family readiness to learn indicated by: asking questions, trying to perform skills, interest, and return verbalization   Persons(s) to be included in education: patient (P)  Barriers to Learning/Limitations: none  Measures taken if barriers to learning present: N/A  Patient Goal (s): Pt would like to get back to walking without a cane and to be able to go bike riding.   Patient Self Reported Health Status: fair  Rehabilitation Potential: good    SUBJECTIVE  Pain Level (0-10 scale): worst - 9/10   best - 0/10    current - 2/10  [x] constant [] intermittent [x] improving [] worsening [] no change since onset    Subjective functional status/changes:     Limitations to PLOF: difficulty walking with SPC, stair negotiation, unable to bike ride for fun  Mechanism of Injury: post surgical  Current symptoms/Complaints: Pt c/o of L hip pain, weakness, and tightness that has affected her ADLs post L THA.  Previous Treatment/Compliance: home PT  PMHx/Surgical Hx: s/p L posteriolateral THA 01/08/23  Work Hx: retired  Living Situation: [place] with daughter  Pt Goals: Pt would like to regain her normal mobility and get back to walking without a SPC and riding her bike.     OBJECTIVE/EXAMINATION    Gait:  []  Normal    [x]  Abnormal    [x]  Antalgic    []  NWB    Device: SPC    Describe: antalgic on the L leg         ROM/Strength         Strength (1-5)  Hip Right Left   Flexion 3 3+   Extension     Abduction 3 3   Adduction  Knee Left Right   Extension 4- 4-   Flexion 4- 4-        Flexibility: []  Unable to assess at this time  Hamstrings:    (L) Tightness= []  WNL   []  Min   [x]  Mod   []  Severe    (R) Tightness= []  WNL   []  Min   [x]  Mod   []  Severe  Quadriceps:    (L) Tightness= []  WNL   []  Min   [x]  Mod   []  Severe    (R) Tightness= []  WNL   []  Min   [x]  Mod   []  Severe  Gastroc:    (L) Tightness= []  WNL   []  Min   [x]  Mod   []  Severe    (R) Tightness= []  WNL   []  Min   [x]  Mod   []  Severe                                   ASSESSMENT/Changes in Function: Pt presents s/p L posteriolateral THA and would benefit from skilled PT to address ROM, strength, balance impairments to decrease pain and improve functional mobility.     Patient will continue to benefit from skilled PT services to modify and progress therapeutic interventions, analyze and address functional mobility deficits, analyze and address ROM deficits, analyze and address strength deficits, analyze and address soft tissue restrictions, analyze and cue for proper movement patterns, and analyze and modify for postural abnormalities to address functional deficits and attain remaining goals.      PLAN    Short Term Goals: To be accomplished in  3-4 weeks:  1. Independent with HEP.  EVAL: N/A  2. Decrease max pain to <3/10 to assist with completion of ADLs and IADLs without significant pain.   EVAL: 9/10 max pain  3. Pt is able to negotiate stair with reciprocal gait pattern and 1 railing for return to PLOF.   EVAL: step to gait pattern with b/l railings.    Long Term Goals: To be accomplished in  6-8  weeks:  1.  Decrease max pain <1/10 to assist with patient able to walk community distances and return to riding her bike.  EVAL: 9/10 max pain  2.  Improve FOTO Functional Status Score by 11 points in order to show significant functional improvement.  EVAL: 44 points  3.  Improve B/L hip flexion and abduction and knee flexion and extension to at least 4/5 MMT to return to PLOF.   EVAL:  R/L Hip Strength: Flexion = 3/3+   Abduction = 3/3; R/L Knee Strength: Flexion = 4-/4-   Extension = 4-/4-  4. Pt is able to walk at least 300 ft without AD.  EVAL: unable to walk without SPC    Frequency / Duration:   Patient to be seen  1-2 times per week for 6-8  weeks:      Patient/ Caregiver education and instruction: Diagnosis, prognosis, self care, activity modification, and exercises [x]   Plan of care has been reviewed with PTA    Certification Period: 03/03/23 -  05/03/23    Kara Pacer, PT       03/03/2023       8:38 AM    Payor: Mcarthur Rossetti MEDICARE / Plan: [place] HMO / Product Type: *No Product type* /     Physician signature required for Medicare, Medicaid, Worker's Comp, Direct Access   ===================================================================  I certify that the above Therapy Services are being furnished while the patient is under my care. I agree with the treatment plan and certify that this therapy is necessary.    62 Signature:_________________________   DATE:_________   TIME:________                           Antoine Poche, PA-C    ** Signature, Date and Time must be completed for valid certification **  Please sign and fax to [facility]. Thank you

## 2023-03-05 ENCOUNTER — Inpatient Hospital Stay: Admit: 2023-03-05 | Payer: MEDICARE | Primary: Internal Medicine

## 2023-03-05 ENCOUNTER — Ambulatory Visit: Payer: MEDICARE | Primary: Internal Medicine

## 2023-03-05 NOTE — Progress Notes (Signed)
PHYSICAL / OCCUPATIONAL THERAPY - DAILY TREATMENT NOTE    Patient Name: Elaine [name]    Date: 03/05/2023    DOB: 1951/04/29  Insurance: Payor: HUMANA MEDICARE / Plan: HUMANA GOLD PLUS HMO / Product Type: *No Product type* /      Patient DOB verified Yes     Visit #   Current / Total 2 16   Time   In / Out 3:36 4:26   Pain   In / Out 0 0   Subjective Functional Status/Changes: "Im feeling ok."     TREATMENT AREA =  Pain in left hip [M25.552]  Pain in left knee [M25.562]  Other low back pain [M54.59]    OBJECTIVE    Modalities Rationale:     increase tissue extensibility to improve patient's ability to progress to PLOF and address remaining functional goals.     min []  Estim Unattended, type/location:                                      []   w/ice    []   w/heat    min []  Estim Attended, type/location:                                     []   w/US     []   w/ice    []   w/heat    []   TENS insruct      min []   Mechanical Traction: type/lbs                   []   pro   []   sup   []   int   []   cont    []   before manual    []   after manual    min []   Ultrasound, settings/location:     10 min  unbill []   Ice     [x]   Heat    location/position: Semi reclined  left hip/knee    min []   Paraffin,  details:     min []   Vasopneumatic Device, press/temp:     min []   Whirlpool / Fluido:    If using vaso (only need to measure limb vaso being performed on)      pre-treatment girth :       post-treatment girth :       measured at (landmark location) :      min []   Other:    Skin assessment post-treatment:   Intact      Therapeutic Procedures:    Tx Min Billable or 1:1 Min (if diff from Tx Min) Procedure, Rationale, Specifics   30  97110 Therapeutic Exercise (timed):  increase ROM, strength, coordination, balance, and proprioception to improve patient's ability to progress to PLOF and address remaining functional goals. (see flow sheet as applicable)     Details if applicable:       10  [id number] Manual Therapy (timed):  decrease pain,  increase ROM, and increase tissue extensibility to improve patient's ability to progress to PLOF and address remaining functional goals.  The manual therapy interventions were performed at a separate and distinct time from the therapeutic activities interventions . (see flow sheet as applicable)     Details if applicable:  STM/  patella mob  left knee PROM left hip within hip  precautions          Details if applicable:            Details if applicable:            Details if applicable:     32  MC BC Totals Reminder: bill using total billable min of TIMED therapeutic procedures (example: do not include dry needle or estim unattended, both untimed codes, in totals to left)  8-22 min = 1 unit; 23-37 min = 2 units; 38-52 min = 3 units; 53-67 min = 4 units; 68-82 min = 5 units   Total Total     [x]   Patient Education billed concurrently with other procedures   [x]  Review HEP    []  Progressed/Changed HEP, detail:    []  Other detail:       Objective Information/Functional Measures/Assessment  Pt completed each there ex fairly well with cues for correct form.  Pt presents with grinding at lateral aspect left knee during manual.  Overall pt benefited with manual due to increased in mobility at left knee/hip. Pt is currently ambulating with SC on right with slight antalgic gait.      Patient will continue to benefit from skilled PT / OT services to modify and progress therapeutic interventions, analyze and address functional mobility deficits, analyze and address ROM deficits, analyze and address strength deficits, analyze and address soft tissue restrictions, analyze and cue for proper movement patterns, analyze and modify for postural abnormalities, analyze and address imbalance/dizziness, and instruct in home and community integration to address functional deficits and attain remaining goals.    Progress toward goals / Updated goals:  []   See Progress Note/Recertification  1. Independent with HEP.  EVAL: N/A  Current:1 x  day  03/05/23  2. Decrease max pain to <3/10 to assist with completion of ADLs and IADLs without significant pain.   EVAL: 9/10 max pain  3. Pt is able to negotiate stair with reciprocal gait pattern and 1 railing for return to PLOF.   EVAL: step to gait pattern with b/l railings.     Long Term Goals: To be accomplished in  6-8  weeks:  1.  Decrease max pain <1/10 to assist with patient able to walk community distances and return to riding her bike.  EVAL: 9/10 max pain  2.  Improve FOTO Functional Status Score by 11 points in order to show significant functional improvement.  EVAL: 44 points  3.  Improve B/L hip flexion and abduction and knee flexion and extension to at least 4/5 MMT to return to PLOF.   EVAL:  R/L Hip Strength: Flexion = 3/3+   Abduction = 3/3; R/L Knee Strength: Flexion = 4-/4-   Extension = 4-/4-  4. Pt is able to walk at least 300 ft without AD.  EVAL: unable to walk without SPC     Frequency / Duration:  Patient to be seen  1-2 times per week for 6-8  weeks:       Next PN due 04/03/23; RC 05/03/23  Auth due after EVAL       PLAN  - [place], PTA    03/05/2023    3:28 PM    Future Appointments   Date Time Provider [place]   03/05/2023  3:30 PM Willaim Rayas, PTA [facility]   03/08/2023  2:10 PM Edman Circle, PTA [facility]   03/09/2023 10:15 AM IOC LAB VISIT [facility]  AMB   03/10/2023  1:30 PM Willaim Rayas, PTA [facility]   03/15/2023 11:30 AM Marvene Staff, PT [facility]   03/17/2023 11:30 AM Marvene Staff, PT [facility]   03/22/2023 11:30 AM Marvene Staff, PT [facility]   03/24/2023 11:30 AM Willaim Rayas, PTA [facility]   05/03/2023  8:30 AM Merdis Delay, DO [facility] AMB   07/02/2023 11:40 AM Thana Farr, MD [place] [facility] AMB   09/16/2023 10:20 AM Lessie Dings, PA-C [facility] Viv[facility] Simas

## 2023-03-08 ENCOUNTER — Inpatient Hospital Stay: Admit: 2023-03-08 | Payer: MEDICARE | Primary: Internal Medicine

## 2023-03-08 DIAGNOSIS — M25552 Pain in left hip: Secondary | ICD-10-CM

## 2023-03-08 NOTE — Progress Notes (Signed)
PHYSICAL / OCCUPATIONAL THERAPY - DAILY TREATMENT NOTE    Patient Name: Elaine [name]    Date: 03/08/2023    DOB: [birth year]  Insurance: Payor: [employer] MEDICARE / Plan: [employer] GOLD PLUS HMO / Product Type: *No Product type* /      Patient DOB verified Yes     Visit #   Current / Total 3 16   Time   In / Out 211 pm  302 pm    Pain   In / Out 0/10  0/10   Subjective Functional Status/Changes: Patient reports having increased low back pain with compared to her left hip.      TREATMENT AREA =  Pain in left hip [M25.552]  Pain in left knee [M25.562]  Other low back pain [M54.59]    OBJECTIVE    Modalities Rationale:     decrease pain and increase tissue extensibility to improve patient's ability to progress to PLOF and address remaining functional goals.     min []  Estim Unattended, type/location:                                      []   w/ice    []   w/heat    min []  Estim Attended, type/location:                                     []   w/US     []   w/ice    []   w/heat    []   TENS insruct      min []   Mechanical Traction: type/lbs                   []   pro   []   sup   []   int   []   cont    []   before manual    []   after manual    min []   Ultrasound, settings/location:     10 min  unbill []   Ice     [x]   Heat    location/position: L/S and left hip    min []   Paraffin,  details:     min []   Vasopneumatic Device, press/temp:     min []   Whirlpool / Fluido:    If using vaso (only need to measure limb vaso being performed on)      pre-treatment girth :       post-treatment girth :       measured at (landmark location) :      min []   Other:    Skin assessment post-treatment:   Intact      Therapeutic Procedures:    Tx Min Billable or 1:1 Min (if diff from Tx Min) Procedure, Rationale, Specifics   26  97110 Therapeutic Exercise (timed):  increase ROM, strength, coordination, balance, and proprioception to improve patient's ability to progress to PLOF and address remaining functional goals. (see flow sheet as applicable)      Details if applicable:       15  0000000 Therapeutic Activity (timed):  use of dynamic activities replicating functional movements to increase ROM, strength, coordination, balance, and proprioception in order to improve patient's ability to progress to PLOF and address remaining functional goals.  (see flow sheet as applicable)     Details if applicable:  Details if applicable:            Details if applicable:            Details if applicable:     10  MC BC Totals Reminder: bill using total billable min of TIMED therapeutic procedures (example: do not include dry needle or estim unattended, both untimed codes, in totals to left)  8-22 min = 1 unit; 23-37 min = 2 units; 38-52 min = 3 units; 53-67 min = 4 units; 68-82 min = 5 units   Total Total     [x]   Patient Education billed concurrently with other procedures   [x]  Review HEP    []  Progressed/Changed HEP, detail:    []  Other detail:       Objective Information/Functional Measures/Assessment    Patient presents to PT with no reports of pain. Patient performed exercises, as per flow sheet, to further assist with increasing core and left hip strength for ambulation. Added clamshells, stairs and step ups. Verbal cues were required for proper form and sequence. Increased low back pain was reported while performing standing there ex. Patient tolerated all other there ex well. MHP was applied to low back and left hip to assist with decreasing discomfort. Patient responded well. Will continue to progress patient as tolerated and able.     Patient will continue to benefit from skilled PT / OT services to modify and progress therapeutic interventions, analyze and address functional mobility deficits, analyze and address ROM deficits, analyze and address strength deficits, analyze and address soft tissue restrictions, analyze and cue for proper movement patterns, analyze and modify for postural abnormalities, and instruct in home and community integration to  address functional deficits and attain remaining goals.    Progress toward goals / Updated goals:  []   See Progress Note/Recertification    1. Independent with HEP.  EVAL: N/A  Current:1 x day  03/08/23  2. Decrease max pain to <3/10 to assist with completion of ADLs and IADLs without significant pain.   EVAL: 9/10 max pain  Current: Patient reports 0/10 pain today. 03/08/23    3. Pt is able to negotiate stair with reciprocal gait pattern and 1 railing for return to PLOF.   EVAL: step to gait pattern with b/l railings.  Current: Initiated 4'' stairs. Patient was able to perform x 2 with reciprocal pattern. 03/08/23     Long Term Goals: To be accomplished in  6-8  weeks:  1.  Decrease max pain <1/10 to assist with patient able to walk community distances and return to riding her bike.  EVAL: 9/10 max pain  Current: Patient reports 0/10 pain today. 03/08/23    2.  Improve FOTO Functional Status Score by 11 points in order to show significant functional improvement.  EVAL: 44 points  Current: Ongoing, will assess at next PN. 03/08/23    3.  Improve B/L hip flexion and abduction and knee flexion and extension to at least 4/5 MMT to return to PLOF.   EVAL:  R/L Hip Strength: Flexion = 3/3+   Abduction = 3/3; R/L Knee Strength: Flexion = 4-/4-   Extension = 4-/4-  Current: Ongoing, will assess at next PN. 03/08/23    4. Pt is able to walk at least 300 ft without AD.  EVAL: unable to walk without [facility]    Next PN/ RC due 04/03/23  Auth due (visit number/ date) 15 visits expire 05/03/23    PLAN  - Continue Plan of  Care  - Upgrade activities as tolerated    Edman Circle, PTA    03/08/2023    9:09 AM    Future Appointments   Date Time Provider [place]   03/08/2023  2:10 PM Edman Circle, PTA [facility]   03/09/2023 10:15 AM IOC LAB VISIT [facility]   03/10/2023  1:30 PM Smalls, Crystal, PTA [facility]   03/15/2023 11:30 AM Marvene Staff, PT [facility]   03/17/2023 11:30 AM Marvene Staff, PT [facility]   03/22/2023 11:30 AM Marvene Staff,  PT [facility]   03/24/2023 11:30 AM Smalls, Crystal, PTA [facility]   05/03/2023  8:30 AM Merdis Delay, DO [facility] AM[facility]   07/02/2023 11:40 AM Thana Farr, MD [place] [facility]   09/16/2023 10:20 AM Lessie Dings, PA-C [facility]

## 2023-03-09 ENCOUNTER — Encounter: Admit: 2023-03-09 | Discharge: 2023-03-09 | Payer: MEDICARE | Primary: Internal Medicine

## 2023-03-09 DIAGNOSIS — E890 Postprocedural hypothyroidism: Secondary | ICD-10-CM

## 2023-03-10 ENCOUNTER — Inpatient Hospital Stay: Admit: 2023-03-10 | Payer: MEDICARE | Primary: Internal Medicine

## 2023-03-10 LAB — CBC/DIFF AMBIGUOUS DEFAULT
Basophils %: 1 %
Basophils Absolute: 0 10*3/uL (ref 0.0–0.2)
Eosinophils %: 2 %
Eosinophils Absolute: 0.1 10*3/uL (ref 0.0–0.4)
Hematocrit: 29.4 % — ABNORMAL LOW (ref 34.0–46.6)
Hemoglobin: 9.1 g/dL — ABNORMAL LOW (ref 11.1–15.9)
Immature Grans (Abs): 0 10*3/uL (ref 0.0–0.1)
Immature Granulocytes %: 0 %
Lymphocytes %: 20 %
Lymphocytes Absolute: 0.8 10*3/uL (ref 0.7–3.1)
MCH: 25.2 pg — ABNORMAL LOW (ref 26.6–33.0)
MCHC: 31 g/dL — ABNORMAL LOW (ref 31.5–35.7)
MCV: 81 fL (ref 79–97)
Monocytes %: 8 %
Monocytes Absolute: 0.3 10*3/uL (ref 0.1–0.9)
Neutrophils %: 69 %
Neutrophils Absolute: 2.8 10*3/uL (ref 1.4–7.0)
Platelets: 302 10*3/uL (ref 150–450)
RBC: 3.61 x10E6/uL — ABNORMAL LOW (ref 3.77–5.28)
RDW: 15.8 % — ABNORMAL HIGH (ref 11.7–15.4)
WBC: 4.1 10*3/uL (ref 3.4–10.8)

## 2023-03-10 LAB — COMPREHENSIVE METABOLIC PANEL
ALT: 19 IU/L (ref 0–32)
AST: 16 IU/L (ref 0–40)
Albumin/Globulin Ratio: 1.7 (ref 1.2–2.2)
Albumin: 3.9 g/dL (ref 3.8–4.8)
Alkaline Phosphatase: 104 IU/L (ref 44–121)
BUN/Creatinine Ratio: 11 — ABNORMAL LOW (ref 12–28)
BUN: 6 mg/dL — ABNORMAL LOW (ref 8–27)
CO2: 28 mmol/L (ref 20–29)
Calcium: 9.5 mg/dL (ref 8.7–10.3)
Chloride: 103 mmol/L (ref 96–106)
Creatinine: 0.56 mg/dL — ABNORMAL LOW (ref 0.57–1.00)
Est, Glom Filt Rate: 96 mL/min/{1.73_m2} (ref >59)
Globulin, Total: 2.3 g/dL (ref 1.5–4.5)
Glucose: 92 mg/dL (ref 70–99)
Potassium: 3.6 mmol/L (ref 3.5–5.2)
Sodium: 143 mmol/L (ref 134–144)
Total Bilirubin: 0.5 mg/dL (ref 0.0–1.2)
Total Protein: 6.2 g/dL (ref 6.0–8.5)

## 2023-03-10 LAB — TSH: TSH: 1.74 u[IU]/mL (ref 0.450–4.500)

## 2023-03-10 LAB — LIPID PANEL
Cholesterol: 145 mg/dL (ref 100–199)
HDL: 61 mg/dL (ref >39)
LDL Calculated: 71 mg/dL (ref 0–99)
Triglycerides: 66 mg/dL (ref 0–149)
VLDL Cholesterol Calculated: 13 mg/dL (ref 5–40)

## 2023-03-10 LAB — HEMOGLOBIN A1C: Hemoglobin A1C: 5.3 % (ref 4.8–5.6)

## 2023-03-10 LAB — MICROALBUMIN / CREATININE URINE RATIO
Albumin, U: 4.1 ug/mL
Creatinine, Ur: 56.2 mg/dL
Microalb/Creat Ratio: 7 mg/g creat (ref 0–29)

## 2023-03-10 LAB — SPECIMEN STATUS REPORT

## 2023-03-10 LAB — VITAMIN D 25 HYDROXY: Vit D, 25-Hydroxy: 108 ng/mL — ABNORMAL HIGH (ref 30.0–100.0)

## 2023-03-10 NOTE — Progress Notes (Signed)
PHYSICAL / OCCUPATIONAL THERAPY - DAILY TREATMENT NOTE    Patient Name: Elaine NakayamaJuanita A Lamore    Date: 03/10/2023    DOB: [birth year]  Insurance: Payor: HU[employer]NA MEDICARE / Plan: [employer] GOLD PLUS HMO / Product Type: *No Product type* /      Patient DOB verified Yes     Visit #   Current / Total 4 16   Time   In / Out 1:30 2:12   Pain   In / Out 0 0   Subjective Functional Status/Changes: "No pain."     TREATMENT AREA =  Pain in left hip [M25.552]  Pain in left knee [M25.562]  Other low back pain [M54.59]    OBJECTIVE         Therapeutic Procedures:    Tx Min Billable or 1:1 Min (if diff from Tx Min) Procedure, Rationale, Specifics   19  97110 Therapeutic Exercise (timed):  increase ROM, strength, coordination, balance, and proprioception to improve patient's ability to progress to PLOF and address remaining functional goals. (see flow sheet as applicable)     Details if applicable:       15  97530 Therapeutic Activity (timed):  use of dynamic activities replicating functional movements to increase ROM, strength, coordination, balance, and proprioception in order to improve patient's ability to progress to PLOF and address remaining functional goals.  (see flow sheet as applicable)     Details if applicable:     8  97140 Manual Therapy (timed):  decrease pain, increase ROM, and increase tissue extensibility to improve patient's ability to progress to PLOF and address remaining functional goals.  The manual therapy interventions were performed at a separate and distinct time from the therapeutic activities interventions . (see flow sheet as applicable)     Details if applicable:  STM/DTM  left hip  flexor/groin area /hip          Details if applicable:            Details if applicable:     42  MC BC Totals Reminder: bill using total billable min of TIMED therapeutic procedures (example: do not include dry needle or estim unattended, both untimed codes, in totals to left)  8-22 min = 1 unit; 23-37 min = 2 units; 38-52 min = 3  units; 53-67 min = 4 units; 68-82 min = 5 units   Total Total     [x]   Patient Education billed concurrently with other procedures   [x]  Review HEP    []  Progressed/Changed HEP, detail:    []  Other detail:       Objective Information/Functional Measures/Assessment  Pt responded fairly well to each strengthening there ex with minimal cues for upright posture for 3 way hip there ex. Pt presents with tightness at left hip/groin/flexor  during manual. Following manual tightness was decreased.      Patient will continue to benefit from skilled PT / OT services to modify and progress therapeutic interventions, analyze and address functional mobility deficits, analyze and address ROM deficits, analyze and address strength deficits, analyze and address soft tissue restrictions, analyze and cue for proper movement patterns, analyze and modify for postural abnormalities, analyze and address imbalance/dizziness, and instruct in home and community integration to address functional deficits and attain remaining goals.    Progress toward goals / Updated goals:  []   See Progress Note/Recertification  . Independent with HEP.  EVAL: N/A  Current:1 x day  03/08/23  2. Decrease max pain to <3/10 to  assist with completion of ADLs and IADLs without significant pain.   EVAL: 9/10 max pain  Current: Patient reports 0/10 pain today. 03/08/23     3. Pt is able to negotiate stair with reciprocal gait pattern and 1 railing for return to PLOF.   EVAL: step to gait pattern with b/l railings.  Current: Initiated 4'' stairs. Patient was able to perform x 2 with reciprocal pattern. 03/08/23     Long Term Goals: To be accomplished in  6-8  weeks:  1.  Decrease max pain <1/10 to assist with patient able to walk community distances and return to riding her bike.  EVAL: 9/10 max pain  Current: Patient reports 0/10 pain today. 03/08/23     2.  Improve FOTO Functional Status Score by 11 points in order to show significant functional improvement.  EVAL: 44  points  Current: Ongoing, will assess at next PN. 03/08/23     3.  Improve B/L hip flexion and abduction and knee flexion and extension to at least 4/5 MMT to return to PLOF.   EVAL:  R/L Hip Strength: Flexion = 3/3+   Abduction = 3/3; R/L Knee Strength: Flexion = 4-/4-   Extension = 4-/4-  Current: Ongoing, will assess at next PN. 03/08/23     4. Pt is able to walk at least 300 ft without AD.  EVAL: unable to walk without [facility]     Next PN/ RC due 04/03/23  Auth due (visit number/ date) 15 visits expire 05/03/23      PLAN  - Upgrade activities as tolerated    [facility], PTA    03/10/2023    1:46 PM    Future Appointments   Date Time Provider Department Center   03/15/2023 11:30 AM Neta Mends, PT [facility]   03/17/2023 11:30 AM Neta Mends, PT [facility]   03/22/2023 11:30 AM Neta Mends, PT [facility]   03/24/2023 11:30 AM Oletta Cohn, PTA [facility]   05/03/2023  8:30 AM Heloise Beecham, DO [facility]   07/02/2023 11:40 AM Marvia Pickles, MD [facility]   09/16/2023 10:20 AM Reather Converse, PA-C [facility]

## 2023-03-15 ENCOUNTER — Inpatient Hospital Stay: Admit: 2023-03-15 | Payer: MEDICARE | Primary: Internal Medicine

## 2023-03-15 NOTE — Telephone Encounter (Signed)
Patient is aware of results and recommendations.   Patient verbalizes understanding.

## 2023-03-15 NOTE — Telephone Encounter (Signed)
-----   Message from Marvia Pickles, MD sent at 03/15/2023 12:29 PM EDT -----  Let pt know her vit D level is too high so she should stop her current vit D supplement for now. She can continue to take a multivitamin. Her iron remains low and she should f/u with her hematologist. Rest of blood work is good.

## 2023-03-15 NOTE — Other (Signed)
Let pt know her vit D level is too high so she should stop her current vit D supplement for now. She can continue to take a multivitamin. Her iron remains low and she should f/u with her hematologist. Rest of blood work is good.

## 2023-03-15 NOTE — Telephone Encounter (Signed)
Left message for patient to call the office

## 2023-03-15 NOTE — Progress Notes (Signed)
PHYSICAL / OCCUPATIONAL THERAPY - DAILY TREATMENT NOTE    Patient Name: Elaine [name]    Date: 03/15/2023    DOB: March 26, 1951  Insurance: Payor: HU[employer]NA MEDICARE / Plan: HU[employer]NA GOLD PLUS HMO / Product Type: *No Product type* /      Patient DOB verified Yes     Visit #   Current / Total 5 16   Time   In / Out 11:33 12:12   Pain   In / Out 0 0   Subjective Functional Status/Changes: "Back is a little stiff today"     TREATMENT AREA =  Pain in left hip [M25.552]  Pain in left knee [M25.562]  Other low back pain [M54.59]  OBJECTIVE  Therapeutic Procedures:    Tx Min Billable or 1:1 Min (if diff from Tx Min) Procedure, Rationale, Specifics   15  97110 Therapeutic Exercise (timed):  increase ROM, strength, coordination, balance, and proprioception to improve patient's ability to progress to PLOF and address remaining functional goals. (see flow sheet as applicable)     Details if applicable:  Nustep progressed to level 2 , added flexibility for quads and HS due to c/o pain with gait and "feeling tight".Progressed supine exercises with ankle weights       97530 Therapeutic Activity (timed):  use of dynamic activities replicating functional movements to increase ROM, strength, coordination, balance, and proprioception in order to improve patient's ability to progress to PLOF and address remaining functional goals.  (see flow sheet as applicable)     Details if applicable:     8  97140 Manual Therapy (timed):  decrease pain, increase ROM, and increase tissue extensibility to improve patient's ability to progress to PLOF and address remaining functional goals.  The manual therapy interventions were performed at a separate and distinct time from the therapeutic activities interventions . (see flow sheet as applicable)     Details if applicable: lateral ITB, quad, and HS tendons, quad belly LLE in supine, AAROM and PROM L knee and hip to address patellar tracking issue    16   97116 Gait Training (timed):    200 feet with SPC and  no AD  (assistive device) over even, indoors surfaces with SUP level of assist. Cuing for upright posture as able, and swing phase, stance phase, knee extension and flexion .  To improve safety and dynamic movement with household/community ambulation.  Stair training with progression to reciprocal.      Details if applicable:            Details if applicable:     39  MC BC Totals Reminder: bill using total billable min of TIMED therapeutic procedures (example: do not include dry needle or estim unattended, both untimed codes, in totals to left)  8-22 min = 1 unit; 23-37 min = 2 units; 38-52 min = 3 units; 53-67 min = 4 units; 68-82 min = 5 units   Total Total     [x]   Patient Education billed concurrently with other procedures   [x]  Review HEP    []  Progressed/Changed HEP, detail:    []  Other detail:       Objective Information/Functional Measures/Assessment  Pt session focused on strengthening, gait quality and flexibility, with care to avoid exacerbating underlying LBP.  Added fwd/lateral/ and front facing step ups/down for quad and glute strengthening. Fwd step downs needed close SUP and cueing for muscle activation.  Would benefit from more quad stretching, address patellar restrictions laterally causing knee pain  since surgery.     Patient will continue to benefit from skilled PT / OT services to modify and progress therapeutic interventions, analyze and address functional mobility deficits, analyze and address ROM deficits, analyze and address strength deficits, analyze and address soft tissue restrictions, analyze and cue for proper movement patterns, analyze and modify for postural abnormalities, analyze and address imbalance/dizziness, and instruct in home and community integration to address functional deficits and attain remaining goals.    Progress toward goals / Updated goals:  []   See Progress Note/Recertification  . Independent with HEP.  EVAL: N/A  Current:1 x day  03/08/23  2. Decrease max pain to  <3/10 to assist with completion of ADLs and IADLs without significant pain.   EVAL: 9/10 max pain  Current: Patient reports 0/10 pain today. 03/08/23     3. Pt is able to negotiate stair with reciprocal gait pattern and 1 railing for return to PLOF.   EVAL: step to gait pattern with b/l railings.  Current: Pt ascended and descended x4 6'' stairs, reciprocal using 1 handrail, with SUP and cues, poor eccentric control LLE 03/15/23, progressing      Long Term Goals: To be accomplished in  6-8  weeks:  1.  Decrease max pain <1/10 to assist with patient able to walk community distances and return to riding her bike.  EVAL: 9/10 max pain  Current: Patient reports 0/10 pain today. 03/08/23     2.  Improve FOTO Functional Status Score by 11 points in order to show significant functional improvement.  EVAL: 44 points  Current: Ongoing, will assess at next PN. 03/08/23     3.  Improve B/L hip flexion and abduction and knee flexion and extension to at least 4/5 MMT to return to PLOF.   EVAL:  R/L Hip Strength: Flexion = 3/3+   Abduction = 3/3; R/L Knee Strength: Flexion = 4-/4-   Extension = 4-/4-  Current: Ongoing, will assess at next PN. 03/08/23     4. Pt is able to walk at least 300 ft without AD.  EVAL: unable to walk without SPC, ambulates community distances with flexed posture for 250-300 feet, and without cane with SUP for 10 feet, unsteady, 03/15/23, progressing.      Next PN/ RC due 04/03/23  Auth due (visit number/ date) 15 visits expire 05/03/23      PLAN  - Upgrade activities as tolerated    Neta Mends, PT    03/15/2023    11:14 AM    Future Appointments   Date Time Provider Department Center   03/15/2023 11:30 AM Neta Mends, PT [facility]   03/17/2023 11:30 AM Neta Mends, PT [facility]   03/22/2023 11:30 AM Neta Mends, PT [facility]   03/24/2023 11:30 AM Oletta Cohn, PTA [facility]   05/03/2023  8:30 AM Heloise Beecham, DO [facility]   07/02/2023 11:40 AM Marvia Pickles, MD [facility]   09/16/2023 10:20 AM Reather Converse, PA-C [facility]

## 2023-03-16 ENCOUNTER — Encounter: Payer: MEDICARE | Attending: Internal Medicine | Primary: Internal Medicine

## 2023-03-17 ENCOUNTER — Inpatient Hospital Stay: Admit: 2023-03-17 | Payer: MEDICARE | Primary: Internal Medicine

## 2023-03-17 NOTE — Progress Notes (Signed)
PHYSICAL / OCCUPATIONAL THERAPY - DAILY TREATMENT NOTE    Patient Name: Elaine [name]    Date: 03/17/2023    DOB: 1951-10-24  Insurance: Payor: HU[employer] MEDICARE / Plan: [employer] GOLD PLUS HMO / Product Type: *No Product type* /      Patient DOB verified Yes     Visit #   Current / Total 6 16   Time   In / Out 11:33 12:29   Pain   In / Out 0 0   Subjective Functional Status/Changes: "I feel like I am coming along"      TREATMENT AREA =  Pain in left hip [M25.552]  Pain in left knee [M25.562]  Other low back pain [M54.59]  OBJECTIVE  Therapeutic Procedures:    Tx Min Billable or 1:1 Min (if diff from Tx Min) Procedure, Rationale, Specifics   30  97110 Therapeutic Exercise (timed):  increase ROM, strength, coordination, balance, and proprioception to improve patient's ability to progress to PLOF and address remaining functional goals. (see flow sheet as applicable)     Details if applicable:  TM introduced today, pt with dec safety awareness off and on, and during TM walking, distracted by TV, has had a fall in clinic, needs close SUP on TM. Session focused on muscle activation at end range of allowable flexibility:  Ex: hip flexion in supine after hip flexor stretch, calf raises after heel cord stretching, squats after glute stretch. TC for all  and verbal cues repeated for all.        97530 Therapeutic Activity (timed):  use of dynamic activities replicating functional movements to increase ROM, strength, coordination, balance, and proprioception in order to improve patient's ability to progress to PLOF and address remaining functional goals.  (see flow sheet as applicable)     Details if applicable:       97140 Manual Therapy (timed):  decrease pain, increase ROM, and increase tissue extensibility to improve patient's ability to progress to PLOF and address remaining functional goals.  The manual therapy interventions were performed at a separate and distinct time from the therapeutic activities interventions . (see  flow sheet as applicable)     Details if applicable: lateral ITB, quad, and HS tendons, quad belly LLE in supine, AAROM and PROM L knee and hip to address patellar tracking issue    3   97116 Gait Training (timed):    100, 200 feet with SPC and no AD  (assistive device) over even, indoors surfaces with SUP level of assist. Cuing for upright posture as able, and swing phase, stance phase, knee extension and flexion .  To improve safety and dynamic movement with household/community ambulation.     Details if applicable:  educated on fall risk and benefits of a rollator, and tripod rollator.    23     [id number] Neuromuscular Re-Education (timed):  improve balance, coordination, kinesthetic sense, posture, core stability and proprioception to improve patient's ability to develop conscious control of individual muscles and awareness of position of extremities in order to progress to PLOF and address remaining functional goals.      Details if applicable:  session focus on SLS time, dynamic balance on foam. CGA for safety .    56  MC BC Totals Reminder: bill using total billable min of TIMED therapeutic procedures (example: do not include dry needle or estim unattended, both untimed codes, in totals to left)  8-22 min = 1 unit; 23-37 min = 2 units; 38-52 min =  3 units; 53-67 min = 4 units; 68-82 min = 5 units   Total Total       Patient Education billed concurrently with other procedures    Review HEP     Progressed/Changed HEP, detail:     Other detail:       Objective Information/Functional Measures/Assessment  Pt session focused on progressive strengthening, dynamic balance on foam, tolerated hip flexor and quad stretching, with improved pain after.  Decreased safety awareness ambulating around clinic, needed SUP on and off treadmill and near the mat tables to avoid a fall.   Heavily advised on safety in the community and recommended a rollator for utmost safety.      Patient will continue to benefit from skilled  PT / OT services to modify and progress therapeutic interventions, analyze and address functional mobility deficits, analyze and address ROM deficits, analyze and address strength deficits, analyze and address soft tissue restrictions, analyze and cue for proper movement patterns, analyze and modify for postural abnormalities, analyze and address imbalance/dizziness, and instruct in home and community integration to address functional deficits and attain remaining goals.    Progress toward goals / Updated goals:    See Progress Note/Recertification  . Independent with HEP.  EVAL: N/A  Current:1 x day  03/08/23  2. Decrease max pain to <3/10 to assist with completion of ADLs and IADLs without significant pain.   EVAL: 9/10 max pain  Current: Patient reports 0/10 pain today. 03/08/23     3. Pt is able to negotiate stair with reciprocal gait pattern and 1 railing for return to PLOF.   EVAL: step to gait pattern with b/l railings.  Current: Pt ascended and descended x4 6'' stairs, reciprocal using 1 handrail, with SUP and cues, poor eccentric control LLE 03/15/23, progressing      Long Term Goals: To be accomplished in  6-8  weeks:  1.  Decrease max pain <1/10 to assist with patient able to walk community distances and return to riding her bike.  EVAL: 9/10 max pain  Current: Patient reports 0/10 pain today. 03/17/23     2.  Improve FOTO Functional Status Score by 11 points in order to show significant functional improvement.  EVAL: 44 points  Current: Ongoing, will assess at next PN. 03/08/23     3.  Improve B/L hip flexion and abduction and knee flexion and extension to at least 4/5 MMT to return to PLOF.   EVAL:  R/L Hip Strength: Flexion = 3/3+   Abduction = 3/3; R/L Knee Strength: Flexion = 4-/4-   Extension = 4-/4-  Current: Ongoing, will assess at next PN. 03/08/23     4. Pt is able to walk at least 300 ft without AD.  EVAL: unable to walk without SPC, ambulates community distances with flexed posture for 250-300 feet,  and without cane with SUP for 10 feet, unsteady, 03/15/23, progressing.      Next PN/ RC due 04/03/23  Auth due (visit number/ date) 15 visits expire 05/03/23      PLAN  - Upgrade activities as tolerated    Neta Mends, PT    03/17/2023    10:06 AM    Future Appointments   Date Time Provider Department Center   03/17/2023 11:30 AM Neta Mends, PT [facility]   03/22/2023 11:30 AM Neta Mends, PT [facility]   03/24/2023 11:30 AM Oletta Cohn, PTA [facility]   05/03/2023  8:30 AM Westcott, Earle Gell, DO [facility]  07/02/2023 11:40 AM Marvia Pickles, MD [facility]   09/16/2023 10:20 AM Reather Converse, PA-C [facility] [place]

## 2023-03-22 ENCOUNTER — Inpatient Hospital Stay: Admit: 2023-03-22 | Payer: MEDICARE | Primary: Internal Medicine

## 2023-03-22 NOTE — Progress Notes (Signed)
PHYSICAL / OCCUPATIONAL THERAPY - DAILY TREATMENT NOTE    Patient Name: Elaine [name]    Date: 03/22/2023    DOB: October 16, 1951  Insurance: Payor: HU[employer]NA MEDICARE / Plan: [employer] GOLD PLUS HMO / Product Type: *No Product type* /      Patient DOB verified Yes     Visit #   Current / Total 7 16   Time   In / Out 11:34 12:20   Pain   In / Out 0 0   Subjective Functional Status/Changes: "Didn't do the exercises, had a death in the family"      TREATMENT AREA =  Pain in left hip [M25.552]  Pain in left knee [M25.562]  Other low back pain [M54.59]  OBJECTIVE  Therapeutic Procedures:    Tx Min Billable or 1:1 Min (if diff from Tx Min) Procedure, Rationale, Specifics   21  97110 Therapeutic Exercise (timed):  increase ROM, strength, coordination, balance, and proprioception to improve patient's ability to progress to PLOF and address remaining functional goals. (see flow sheet as applicable)     Details if applicable:  TM progressed today, Ex: TC for all  and verbal cues repeated for all. See flowsheet.        91478 Therapeutic Activity (timed):  use of dynamic activities replicating functional movements to increase ROM, strength, coordination, balance, and proprioception in order to improve patient's ability to progress to PLOF and address remaining functional goals.  (see flow sheet as applicable)     Details if applicable:       97140 Manual Therapy (timed):  decrease pain, increase ROM, and increase tissue extensibility to improve patient's ability to progress to PLOF and address remaining functional goals.  The manual therapy interventions were performed at a separate and distinct time from the therapeutic activities interventions . (see flow sheet as applicable)     Details if applicable:         25     97112 Neuromuscular Re-Education (timed):  improve balance, coordination, kinesthetic sense, posture, core stability and proprioception to improve patient's ability to develop conscious control of individual muscles and  awareness of position of extremities in order to progress to PLOF and address remaining functional goals.      Details if applicable:  Eo/EC progressed on foam.    46  MC BC Totals Reminder: bill using total billable min of TIMED therapeutic procedures (example: do not include dry needle or estim unattended, both untimed codes, in totals to left)  8-22 min = 1 unit; 23-37 min = 2 units; 38-52 min = 3 units; 53-67 min = 4 units; 68-82 min = 5 units   Total Total       Patient Education billed concurrently with other procedures    Review HEP     Progressed/Changed HEP, detail:     Other detail:       Objective Information/Functional Measures/Assessment  Pt session focused on progressing all exercises.  Eo/Ec on foam well tolerated with no LOB, hip strategy intact.  Added more weight to ankle weights, well tolerated.  Improved safety awareness today in clinic.     Patient will continue to benefit from skilled PT / OT services to modify and progress therapeutic interventions, analyze and address functional mobility deficits, analyze and address ROM deficits, analyze and address strength deficits, analyze and address soft tissue restrictions, analyze and cue for proper movement patterns, analyze and modify for postural abnormalities, analyze and address imbalance/dizziness, and instruct in home and  community integration to address functional deficits and attain remaining goals.    Progress toward goals / Updated goals:  []   See Progress Note/Recertification  . Independent with HEP.  EVAL: N/A  Current:1 x day  03/08/23  2. Decrease max pain to <3/10 to assist with completion of ADLs and IADLs without significant pain.   EVAL: 9/10 max pain  Current: Patient reports 0/10 pain today. 03/08/23     3. Pt is able to negotiate stair with reciprocal gait pattern and 1 railing for return to PLOF.   EVAL: step to gait pattern with b/l railings.  Current: Pt ascended and descended x4 6'' stairs, reciprocal using 1 handrail,  with SUP and cues, poor eccentric control LLE 03/15/23, progressing      Long Term Goals: To be accomplished in  6-8  weeks:  1.  Decrease max pain <1/10 to assist with patient able to walk community distances and return to riding her bike.  EVAL: 9/10 max pain  Current: Patient reports 0/10 pain today. 03/17/23     2.  Improve FOTO Functional Status Score by 11 points in order to show significant functional improvement.  EVAL: 44 points  Current: Ongoing, will assess at next PN. 03/08/23     3.  Improve B/L hip flexion and abduction and knee flexion and extension to at least 4/5 MMT to return to PLOF.   EVAL:  R/L Hip Strength: Flexion = 3/3+   Abduction = 3/3; R/L Knee Strength: Flexion = 4-/4-   Extension = 4-/4-  Current: Ongoing, will assess at next PN. 03/08/23     4. Pt is able to walk at least 300 ft without AD.  EVAL: unable to walk without SPC, ambulates community distances with flexed posture for 250-300 feet, and without cane with SUP for 10 feet, unsteady, 03/15/23, progressing.   03/22/23 ambulating comm distances with cane     Next PN/ RC due 04/03/23  Auth due (visit number/ date) 15 visits expire 05/03/23      PLAN  - Upgrade activities as tolerated    Neta Mends, PT    03/22/2023    8:06 AM    Future Appointments   Date Time Provider Department Center   03/22/2023 11:30 AM Neta Mends, PT [facility]   03/24/2023 11:30 AM Oletta Cohn, PTA [facility]   05/03/2023  8:30 AM Heloise Beecham, DO [facility]   07/02/2023 11:40 AM Marvia Pickles, MD [facility]   09/16/2023 10:20 AM Reather Converse, PA-C [facility] [place]

## 2023-03-24 ENCOUNTER — Inpatient Hospital Stay: Admit: 2023-03-24 | Payer: MEDICARE | Primary: Internal Medicine

## 2023-03-24 NOTE — Progress Notes (Signed)
PHYSICAL / OCCUPATIONAL THERAPY - DAILY TREATMENT NOTE    Patient Name: Elaine [name]    Date: 03/24/2023    DOB: [birth year]  Insurance: Payor: HUMANA MEDICARE / Plan: HU[employer]NA GOLD PLUS HMO / Product Type: *No Product type* /      Patient DOB verified Yes     Visit #   Current / Total 8 16   Time   In / Out 11:33 12:15   Pain   In / Out 0 0   Subjective Functional Status/Changes: "No pain today."     TREATMENT AREA =  Pain in left hip [M25.552]  Pain in left knee [M25.562]  Other low back pain [M54.59]    OBJECTIVE         Therapeutic Procedures:    Tx Min Billable or 1:1 Min (if diff from Tx Min) Procedure, Rationale, Specifics   25  97110 Therapeutic Exercise (timed):  increase ROM, strength, coordination, balance, and proprioception to improve patient's ability to progress to PLOF and address remaining functional goals. (see flow sheet as applicable)     Details if applicable:       17  97530 Therapeutic Activity (timed):  use of dynamic activities replicating functional movements to increase ROM, strength, coordination, balance, and proprioception in order to improve patient's ability to progress to PLOF and address remaining functional goals.  (see flow sheet as applicable)     Details if applicable:            Details if applicable:            Details if applicable:            Details if applicable:     42  MC BC Totals Reminder: bill using total billable min of TIMED therapeutic procedures (example: do not include dry needle or estim unattended, both untimed codes, in totals to left)  8-22 min = 1 unit; 23-37 min = 2 units; 38-52 min = 3 units; 53-67 min = 4 units; 68-82 min = 5 units   Total Total       Patient Education billed concurrently with other procedures    Review HEP     Progressed/Changed HEP, detail:     Other detail:       Objective Information/Functional Measures/Assessment  Pt responded well to each strengthening there ex today with no knee left knee pain today. Pt ambulated with fast  pace with minimal cues for upright posture.  Pt also required cues for sequencing stairs with correct form. Overall pt is progressing towards all goals.      Patient will continue to benefit from skilled PT / OT services to modify and progress therapeutic interventions, analyze and address functional mobility deficits, analyze and address ROM deficits, analyze and address strength deficits, analyze and address soft tissue restrictions, analyze and cue for proper movement patterns, analyze and modify for postural abnormalities, analyze and address imbalance/dizziness, and instruct in home and community integration to address functional deficits and attain remaining goals.    Progress toward goals / Updated goals:    See Progress Note/Recertification   Independent with HEP.  EVAL: N/A  Current:1 x day  03/08/23  2. Decrease max pain to <3/10 to assist with completion of ADLs and IADLs without significant pain.   EVAL: 9/10 max pain  Current: Patient reports 0/10 pain today. 03/08/23     3. Pt is able to negotiate stair with reciprocal gait pattern and 1 railing for return to PLOF.  EVAL: step to gait pattern with b/l railings.  Current: Pt ascended and descended x4 6'' stairs, reciprocal using 1 handrail, with SUP and cues, poor eccentric control LLE 03/15/23, progressing    pt ascend/descend 6" stairs (B) HR with cues for correct sequence 03/24/23     Long Term Goals: To be accomplished in  6-8  weeks:  1.  Decrease max pain <1/10 to assist with patient able to walk community distances and return to riding her bike.  EVAL: 9/10 max pain  Current: Patient reports 0/10 pain today. 03/17/23     2.  Improve FOTO Functional Status Score by 11 points in order to show significant functional improvement.  EVAL: 44 points  Current: Ongoing, will assess at next PN. 03/08/23     3.  Improve B/L hip flexion and abduction and knee flexion and extension to at least 4/5 MMT to return to PLOF.   EVAL:  R/L Hip Strength: Flexion = 3/3+    Abduction = 3/3; R/L Knee Strength: Flexion = 4-/4-   Extension = 4-/4-  Current: Ongoing, will assess at next PN. 03/08/23     4. Pt is able to walk at least 300 ft without AD.  EVAL: unable to walk without SPC, ambulates community distances with flexed posture for 250-300 feet, and without cane with SUP for 10 feet, unsteady, 03/15/23, progressing.   03/22/23 ambulating comm distances with cane  Current: pt ambulated 251 ft with rollator minimal cues for upright posture SBA  03/24/23     Next PN/ RC due 04/03/23  Auth due (visit number/ date) 15 visits expire 05/03/23        PLAN  - Continue Plan of Care    Elaine [name]  [place], PTA    03/24/2023    11:29 AM    Future Appointments   Date Time Provider Department Center   03/24/2023 11:30 AM Elaine [name], PTA [facility]   05/10/2023 10:30 AM Elaine Beecham, DO [facility]   07/02/2023 11:40 AM Elaine Pickles, MD [facility]   09/16/2023 10:20 AM Elaine Converse, PA-C [facility] [place]

## 2023-03-29 ENCOUNTER — Ambulatory Visit: Payer: MEDICARE | Attending: Orthopaedic Surgery | Primary: Internal Medicine

## 2023-03-29 ENCOUNTER — Inpatient Hospital Stay: Admit: 2023-03-29 | Payer: MEDICARE | Primary: Internal Medicine

## 2023-03-29 NOTE — Progress Notes (Signed)
PHYSICAL / OCCUPATIONAL THERAPY - DAILY TREATMENT NOTE    Patient Name: Elaine [name]    Date: 03/29/2023    DOB: 04-Oct-1951  Insurance: Payor: HUMANA MEDICARE / Plan: Francine Graven GOLD PLUS HMO / Product Type: *No Product type* /      Patient DOB verified Yes     Visit #   Current / Total 9 16   Time   In / Out 11:29 12:07   Pain   In / Out 0 0   Subjective Functional Status/Changes: "No pain a hip or knee today. "Today is good day."     TREATMENT AREA =  Pain in left hip [M25.552]  Pain in left knee [M25.562]  Other low back pain [M54.59]    OBJECTIVE         Therapeutic Procedures:    Tx Min Billable or 1:1 Min (if diff from Tx Min) Procedure, Rationale, Specifics   23  97110 Therapeutic Exercise (timed):  increase ROM, strength, coordination, balance, and proprioception to improve patient's ability to progress to PLOF and address remaining functional goals. (see flow sheet as applicable)     Details if applicable:       15  97530 Therapeutic Activity (timed):  use of dynamic activities replicating functional movements to increase ROM, strength, coordination, balance, and proprioception in order to improve patient's ability to progress to PLOF and address remaining functional goals.  (see flow sheet as applicable)     Details if applicable:            Details if applicable:            Details if applicable:            Details if applicable:     38  MC BC Totals Reminder: bill using total billable min of TIMED therapeutic procedures (example: do not include dry needle or estim unattended, both untimed codes, in totals to left)  8-22 min = 1 unit; 23-37 min = 2 units; 38-52 min = 3 units; 53-67 min = 4 units; 68-82 min = 5 units   Total Total       Patient Education billed concurrently with other procedures    Review HEP     Progressed/Changed HEP, detail:     Other detail:       Objective Information/Functional Measures/Assessment   Pt experienced minimal pain at left knee during squats.Pt  responded fairly  well to remaining strengthening there ex with no difficulty.  Pt ambulated on TM  with no difficulty.Overall pt is progressing towards all goals.      Patient will continue to benefit from skilled PT / OT services to modify and progress therapeutic interventions, analyze and address functional mobility deficits, analyze and address ROM deficits, analyze and address strength deficits, analyze and address soft tissue restrictions, analyze and cue for proper movement patterns, analyze and modify for postural abnormalities, analyze and address imbalance/dizziness, and instruct in home and community integration to address functional deficits and attain remaining goals.    Progress toward goals / Updated goals:    See Progress Note/Recertification  Independent with HEP.  EVAL: N/A  Current:1 x day  03/08/23  2. Decrease max pain to <3/10 to assist with completion of ADLs and IADLs without significant pain.   EVAL: 9/10 max pain  Current: Patient reports 0/10 pain today. 03/08/23     3. Pt is able to negotiate stair with reciprocal gait pattern and 1 railing for return to PLOF.  EVAL: step to gait pattern with b/l railings.  Current: Pt ascended and descended x4 6'' stairs, reciprocal using 1 handrail, with SUP and cues, poor eccentric control LLE 03/15/23, progressing    pt ascend/descend 6" stairs (B) HR with cues for correct sequence 03/24/23     Long Term Goals: To be accomplished in  6-8  weeks:  1.  Decrease max pain <1/10 to assist with patient able to walk community distances and return to riding her bike.  EVAL: 9/10 max pain  Current: Patient reports 0/10 pain today. 03/17/23     2.  Improve FOTO Functional Status Score by 11 points in order to show significant functional improvement.  EVAL: 44 points  Current: Ongoing, will assess at next PN. 03/08/23     3.  Improve B/L hip flexion and abduction and knee flexion and extension to at least 4/5 MMT to return to PLOF.   EVAL:  R/L Hip Strength: Flexion = 3/3+   Abduction  = 3/3; R/L Knee Strength: Flexion = 4-/4-   Extension = 4-/4-  Current: Ongoing, will assess at next PN. 03/08/23     4. Pt is able to walk at least 300 ft without AD.  EVAL: unable to walk without SPC, ambulates community distances with flexed posture for 250-300 feet, and without cane with SUP for 10 feet, unsteady, 03/15/23, progressing.   03/22/23 ambulating comm distances with cane  Current: pt ambulated 251 ft with rollator minimal cues for upright posture SBA  03/24/23     Next PN/ RC due 04/03/23  Auth due (visit number/ date) 15 visits expire 05/03/23       PLAN  - Continue Plan of Care  _ ambulation NV    Elaina Cara  [place], PTA    03/29/2023    11:59 AM    Future Appointments   Date Time Provider Department Center   04/02/2023 11:30 AM Oletta Cohn, PTA [facility]   04/06/2023  1:30 PM Larene Pickett, PTA [facility]   04/08/2023 11:30 AM Katherina Mires, PT [facility]   04/13/2023 11:30 AM Larene Pickett, PTA [facility]   04/16/2023 11:30 AM Katheran James, PT [facility]   04/20/2023 11:30 AM Larene Pickett, PTA [facility]   05/10/2023 10:30 AM Heloise Beecham, DO [facility]   07/02/2023 11:40 AM Marvia Pickles, MD [facility]   09/16/2023 10:20 AM Reather Converse, PA-C [facility] [place]

## 2023-03-31 ENCOUNTER — Encounter

## 2023-03-31 MED ORDER — LEVOTHYROXINE SODIUM 100 MCG PO TABS
100 | ORAL_TABLET | Freq: Every day | ORAL | 1 refills | Status: DC
Start: 2023-03-31 — End: 2023-07-02

## 2023-04-01 ENCOUNTER — Encounter

## 2023-04-01 NOTE — Telephone Encounter (Signed)
-----   Message from [name] sent at 04/01/2023 12:49 PM EDT -----  Subject: Referral Request    Reason for referral request? Nephrology  Provider patient wants to be referred to(if known): Georgiann Cocker    Provider Phone Number(if known):    Additional Information for Provider? Patient said she called over to get   an appt and they said they never received the referral  ---------------------------------------------------------------------------  --------------  Elaine [name] INFO    [phone]; OK to leave message on voicemail  ---------------------------------------------------------------------------  --------------

## 2023-04-01 NOTE — Telephone Encounter (Signed)
Nephrology referral refaxed

## 2023-04-02 ENCOUNTER — Inpatient Hospital Stay: Admit: 2023-04-02 | Payer: MEDICARE | Primary: Internal Medicine

## 2023-04-02 NOTE — Progress Notes (Signed)
[facility] PHYSICAL THERAPY  [address], [place] [address]  UE:[phone]  PHYSICAL THERAPY PROGRESS NOTE  Patient Name: Elaine [name] DOB: [birth year]   Treatment/Medical Diagnosis: Pain in left hip [M25.552]  Pain in left knee [M25.562]  Other low back pain [M54.59]   Referral Source: Clerance Lav, PA-C     Date of Initial Visit: 03/03/23 Attended Visits: 10 Missed Visits: 0     SUMMARY OF TREATMENT   PT intervention has consisted of therapeutic exercises, functional activities, manual therapy, neuromuscular re-education,HEP to improve pain, mobility,flexibility, and strength to improve pt's ability to squat,bend, and lift increasing pt's ability to perform ADL's and occupational demands. MH utilized for pain management.     CURRENT STATUS  Elaine [name] has been seen for 10 treatments and has shown good progress with PT. Pt reports 75% improvement. FOTO has improved from 44 to 49 which shows improved in functional mobility. Pt reports 5/10 pain on average during the day.  Pt is  able to walk short distances with cane,stand for short period,wash dishes, light household chores. [name] functional deficits are performing heavy chores, lifting, and with balance.     Progress Towards Goals  Independent with HEP.  EVAL: N/A  Current:1 x day  03/08/23 met  2. Decrease max pain to <3/10 to assist with completion of ADLs and IADLs without significant pain.   EVAL: 9/10 max pain  Current: 4/10. 04/02/23 progressing     3. Pt is able to negotiate stair with reciprocal gait pattern and 1 railing for return to PLOF.   EVAL: step to gait pattern with b/l railings.  Current: Pt ascended and descended x4 6'' stairs, reciprocal using 1 handrail, with SUP and cues, poor eccentric control LLE 03/15/23, progressing    pt ascend/descend 6" stairs (B) HR with cues for correct sequence 03/24/23     Long Term Goals: To be accomplished in  6-8  weeks:  1.  Decrease max pain  <1/10 to assist with patient able to walk community distances and return to riding her bike.  EVAL: 9/10 max pain  Current: 4/10   04/02/23 progressing     2.  Improve FOTO Functional Status Score by 11 points in order to show significant functional improvement.  EVAL: 44 points  Current: 49 . 04/02/23 progressing     3.  Improve B/L hip flexion and abduction and knee flexion and extension to at least 4/5 MMT to return to PLOF.   EVAL:  R/L Hip Strength: Flexion = 3/3+   Abduction = 3/3; R/L Knee Strength: Flexion = 4-/4-   Extension = 4-/4-  Current: hip flex  left/right 4    knee flex (B) 4+   knee ext  (B) 4  hip  abd    (B) 4    04/02/23  progressing      4. Pt is able to walk at least 300 ft without AD.  EVAL: unable to walk without SPC, ambulates community distances with flexed posture for 250-300 feet, and without cane with SUP for 10 feet, unsteady, 03/15/23, progressing.   03/22/23 ambulating comm distances with cane  Current: pt ambulated 251 ft with rollator minimal cues for upright posture SBA  03/24/23   219 ft with no  AD  minimal cues for upright posture  04/02/23          Functional Gains:  walk short distances with cane,stand for short period,wash dishes, light household  chores   Functional Deficits: perform heavy chores, lifting, balance   % improvement: 75%  Pain   Average: 5/10       Best: 0/10     Worst: 8/10  Patient Goal: "no pain"    Payor: HU[employer] MEDICARE / Plan: [employer] GOLD PLUS HMO / Product Type: *No Product type* /     Medicare, cannot change goals, cannot adjust frequency/duration, no signature required   Reporting Period: (date from last Prog Note/Eval to current Prog Note/Recert)  03/03/23 - 04/02/23    RECOMMENDATIONS  Patient would benefit from the continuation of skilled rehab interventions, per initial Plan of Care or most recent Medicare Recert, for functional progress to achieving above stated clinically significant goals.    If you have any questions/comments please contact us  directly.  Thank you for allowing [place] to assist in the care of your patient.    [facility], PTA       04/02/2023       1:26 PM

## 2023-04-02 NOTE — Progress Notes (Addendum)
PHYSICAL / OCCUPATIONAL THERAPY - DAILY TREATMENT NOTE    Patient Name: Elaine [name]    Date: 04/02/2023    DOB: 1951-04-11  Insurance: Payor: HU[employer]NA MEDICARE / Plan: [employer] GOLD PLUS HMO / Product Type: *No Product type* /      Patient DOB verified Yes     Visit #   Current / Total 10 16   Time   In / Out 11:40 12:18   Pain   In / Out 0 0   Subjective Functional Status/Changes: "No pain today."     TREATMENT AREA =  Pain in left hip [M25.552]  Pain in left knee [M25.562]  Other low back pain [M54.59]    OBJECTIVE         Therapeutic Procedures:    Tx Min Billable or 1:1 Min (if diff from Tx Min) Procedure, Rationale, Specifics   15  97110 Therapeutic Exercise (timed):  increase ROM, strength, coordination, balance, and proprioception to improve patient's ability to progress to PLOF and address remaining functional goals. (see flow sheet as applicable)     Details if applicable:       23  97530 Therapeutic Activity (timed):  use of dynamic activities replicating functional movements to increase ROM, strength, coordination, balance, and proprioception in order to improve patient's ability to progress to PLOF and address remaining functional goals.  (see flow sheet as applicable)     Details if applicable:  REASSESS GOALS/FOTO          Details if applicable:            Details if applicable:            Details if applicable:     38  MC BC Totals Reminder: bill using total billable min of TIMED therapeutic procedures (example: do not include dry needle or estim unattended, both untimed codes, in totals to left)  8-22 min = 1 unit; 23-37 min = 2 units; 38-52 min = 3 units; 53-67 min = 4 units; 68-82 min = 5 units   Total Total     [x]   Patient Education billed concurrently with other procedures   [x]  Review HEP    []  Progressed/Changed HEP, detail:    []  Other detail:       Objective Information/Functional Measures/Assessment  FOTO 49  Functional Gains: walk short distances with cane,stand for short period,wash  dishes, light household chores  Functional Deficits: perform heavy  chores, lifting, balance  % improvement: 75%  Pain   Average: 5/10       Best: 0/10     Worst: 8/10  Patient Goal: " no pain"   [name] has been seen for 10 treatments and has shown good progress with PT. Pt reports 75% improvement. FOTO has improved from 44 to 49 which shows improved in functional mobility. Pt reports 5/10 pain on average during the day.  Pt is  able to walk short distances with cane,stand for short period,wash dishes, light household chores. [name] functional deficits are performing heavy chores, lifting, and with balance. Recommend  continuation of PT for pain reduction, further strengthening, improve functional mobility and balance.      Patient will continue to benefit from skilled PT / OT services to modify and progress therapeutic interventions, analyze and address functional mobility deficits, analyze and address ROM deficits, analyze and address strength deficits, analyze and address soft tissue restrictions, analyze and cue for proper movement patterns, analyze and modify for postural abnormalities, analyze and address  imbalance/dizziness, and instruct in home and community integration to address functional deficits and attain remaining goals.    Progress toward goals / Updated goals:  []   See Progress Note/Recertification  Independent with HEP.  EVAL: N/A  Current:1 x day  03/08/23 met  2. Decrease max pain to <3/10 to assist with completion of ADLs and IADLs without significant pain.   EVAL: 9/10 max pain  Current: 4/10. 04/02/23 progressing     3. Pt is able to negotiate stair with reciprocal gait pattern and 1 railing for return to PLOF.   EVAL: step to gait pattern with b/l railings.  Current: Pt ascended and descended x4 6'' stairs, reciprocal using 1 handrail, with SUP and cues, poor eccentric control LLE 03/15/23, progressing    pt ascend/descend 6" stairs (B) HR with cues for correct sequence 03/24/23     Long Term  Goals: To be accomplished in  6-8  weeks:  1.  Decrease max pain <1/10 to assist with patient able to walk community distances and return to riding her bike.  EVAL: 9/10 max pain  Current: 4/10   04/02/23 progressing     2.  Improve FOTO Functional Status Score by 11 points in order to show significant functional improvement.  EVAL: 44 points  Current: 49 . 04/02/23 progressing     3.  Improve B/L hip flexion and abduction and knee flexion and extension to at least 4/5 MMT to return to PLOF.   EVAL:  R/L Hip Strength: Flexion = 3/3+   Abduction = 3/3; R/L Knee Strength: Flexion = 4-/4-   Extension = 4-/4-  Current: hip flex  left/right 4    knee flex (B) 4+   knee ext  (B) 4  hip  abd    (B) 4    04/02/23  progressing      4. Pt is able to walk at least 300 ft without AD.  EVAL: unable to walk without SPC, ambulates community distances with flexed posture for 250-300 feet, and without cane with SUP for 10 feet, unsteady, 03/15/23, progressing.   03/22/23 ambulating comm distances with cane  Current: pt ambulated 251 ft with rollator minimal cues for upright posture SBA  03/24/23   219 ft with no  AD  minimal cues for upright posture  04/02/23       PLAN  - Other : FAX   MD NOTE    Oletta Cohn, PTA    04/02/2023    11:45 AM    Future Appointments   Date Time Provider Department Center   04/06/2023  1:30 PM Larene Pickett, PTA [facility]   04/08/2023 11:30 AM Katherina Mires, PT [facility]   04/13/2023 11:30 AM Larene Pickett, PTA [facility]   04/13/2023  2:45 PM [facility] RM 4 3D [facility]   04/16/2023 11:30 AM Katheran James, PT [facility]   04/20/2023 11:30 AM Larene Pickett, PTA [facility]   05/10/2023 10:30 AM Heloise Beecham, DO [facility]   07/02/2023 11:40 AM Marvia Pickles, MD [facility]   09/16/2023 10:20 AM Reather Converse, PA-C [facility] [place]

## 2023-04-06 ENCOUNTER — Inpatient Hospital Stay: Admit: 2023-04-06 | Payer: MEDICARE | Primary: Internal Medicine

## 2023-04-06 NOTE — Progress Notes (Addendum)
PHYSICAL / OCCUPATIONAL THERAPY - DAILY TREATMENT NOTE    Patient Name: Elaine [name]    Date: 04/06/2023    DOB: 09/16/1951  Insurance: Payor: HUMANA MEDICARE / Plan: Francine Graven GOLD PLUS HMO / Product Type: *No Product type* /      Patient DOB verified Yes     Visit #   Current / Total 11 16   Time   In / Out 134 pm  213 pm    Pain   In / Out 0/10 0/10    Subjective Functional Status/Changes:   "My hip feels good. It's my left knee and back that's giving me the trouble."      TREATMENT AREA =  Pain in left hip [M25.552]  Pain in left knee [M25.562]  Other low back pain [M54.59]    OBJECTIVE         Therapeutic Procedures:    Tx Min Billable or 1:1 Min (if diff from Tx Min) Procedure, Rationale, Specifics   15  97110 Therapeutic Exercise (timed):  increase ROM, strength, coordination, balance, and proprioception to improve patient's ability to progress to PLOF and address remaining functional goals. (see flow sheet as applicable)     Details if applicable:       24  97530 Therapeutic Activity (timed):  use of dynamic activities replicating functional movements to increase ROM, strength, coordination, balance, and proprioception in order to improve patient's ability to progress to PLOF and address remaining functional goals.  (see flow sheet as applicable)     Details if applicable:            Details if applicable:            Details if applicable:            Details if applicable:     39  MC BC Totals Reminder: bill using total billable min of TIMED therapeutic procedures (example: do not include dry needle or estim unattended, both untimed codes, in totals to left)  8-22 min = 1 unit; 23-37 min = 2 units; 38-52 min = 3 units; 53-67 min = 4 units; 68-82 min = 5 units   Total Total     [x]   Patient Education billed concurrently with other procedures   [x]  Review HEP    []  Progressed/Changed HEP, detail:    []  Other detail:       Objective Information/Functional Measures/Assessment    Patient continues to present to PT  with no pain and tolerates PT well. Today's session focused on increasing left hip strength for ambulating and stair negotiation. Patient was able to transition from each exercise with no AD. Patient tolerated exercises well with no increased reports of left hip pain. Patient declined modalities today. Will continue to progress patient as tolerated and able.     Patient will continue to benefit from skilled PT / OT services to modify and progress therapeutic interventions, analyze and address functional mobility deficits, analyze and address ROM deficits, analyze and address strength deficits, analyze and address soft tissue restrictions, analyze and cue for proper movement patterns, analyze and modify for postural abnormalities, and instruct in home and community integration to address functional deficits and attain remaining goals.    Progress toward goals / Updated goals:  []   See Progress Note/Recertification    1.Independent with HEP.  EVAL: N/A  PN:1 x day  03/08/23 met    2. Decrease max pain to <3/10 to assist with completion of ADLs and IADLs without  significant pain.   PN: 4/10. 04/02/23 progressing  Current: Patient reports 0/10 pain today. 04/06/23     3. Pt is able to negotiate stair with reciprocal gait pattern and 1 railing for return to PLOF.   PN: Pt ascended and descended x4 6'' stairs, reciprocal using 1 handrail, with SUP and cues, poor eccentric control LLE 03/15/23, progressing    pt ascend/descend 6" stairs (B) HR with cues for correct sequence 03/24/23  Current: Patient able to negotiate stairs with reciprocal pattern and B HR. 04/06/23     Long Term Goals: To be accomplished in  6-8  weeks:  1.  Decrease max pain <1/10 to assist with patient able to walk community distances and return to riding her bike.  PN: 4/10   04/02/23 progressing  Current: Patient reports 0/10 pain today. 04/06/23     2.  Improve FOTO Functional Status Score by 11 points in order to show significant functional improvement.  PN:  49 . 04/02/23 progressing  Current: Ongoing, will assess at next PN. 04/06/23     3.  Improve B/L hip flexion and abduction and knee flexion and extension to at least 4/5 MMT to return to PLOF.   PN: hip flex  left/right 4    knee flex (B) 4+   knee ext  (B) 4  hip  abd   (B) 4    04/02/23  progressing   Current: Ongoing, will assess at next PN. 04/06/23     4. Pt is able to walk at least 300 ft without AD.  PN: pt ambulated 251 ft with rollator minimal cues for upright posture SBA    219 ft with no  AD  minimal cues for upright posture  04/02/23  Current: Ongoing, will assess at next PN. 04/06/23     Next PN/ RC due 05/02/23  Auth due (visit number/ date) 15 visits expire 05/03/23    PLAN  - Continue Plan of Care  - Upgrade activities as tolerated    Larene Pickett, PTA    04/06/2023    8:00 AM    Future Appointments   Date Time Provider Department Center   04/06/2023  1:30 PM Larene Pickett, PTA [facility]   04/08/2023 11:30 AM Katherina Mires, PT [facility]   04/13/2023 11:30 AM Larene Pickett, PTA [facility]   04/13/2023  2:45 PM [facility] RM 4 3D [facility]   04/16/2023 11:30 AM Katheran James, PT [facility]   04/20/2023 11:30 AM Larene Pickett, PTA [facility]   05/10/2023 10:30 AM Heloise Beecham, DO [facility]   07/02/2023 11:40 AM Marvia Pickles, MD [facility]   09/16/2023 10:20 AM Reather Converse, PA-C [facility] [place]

## 2023-04-08 ENCOUNTER — Inpatient Hospital Stay: Payer: MEDICARE | Primary: Internal Medicine

## 2023-04-08 DIAGNOSIS — M25552 Pain in left hip: Secondary | ICD-10-CM

## 2023-04-09 ENCOUNTER — Encounter: Payer: MEDICARE | Primary: Internal Medicine

## 2023-04-12 ENCOUNTER — Ambulatory Visit: Admit: 2023-04-12 | Discharge: 2023-04-12 | Payer: MEDICARE | Primary: Internal Medicine

## 2023-04-12 DIAGNOSIS — Z96642 Presence of left artificial hip joint: Secondary | ICD-10-CM

## 2023-04-12 NOTE — Progress Notes (Signed)
Patient: Elaine [name]                MRN: [id number]       SSN: [id number]  Date of Birth: 1951-01-04        AGE: 72 y.o.        SEX: female    PCP: Marvia Pickles, MD  04/12/23    Chief Complaint: Leg Swelling (Left )      1. Status post total replacement of left hip  2. Orthopedic aftercare      HPI:  Elaine [name] is a 72 y.o. female with chief complaint of   Chief Complaint   Patient presents with    Hip Pain     Lt      -Left total hip arthroplasty, posterior lateral approach, January 08, 2023, Dr. Lorin Picket    We have been planning on a left total hip arthroplasty through the anterolateral approach due to some pain in the lateral hip as well as a overhanging pannus.  She recently, in July, had a GI bleed and has had a cascade of medical issues since then.  She is hoping that she can have her hip replaced at the beginning of the year.        03/01/2023    10:36 AM   AMB PAIN ASSESSMENT   Location of Pain Hip   Location Modifiers Left   Severity of Pain 0     Tobacco Use: Medium Risk (04/12/2023)    Patient History     Smoking Tobacco Use: Former     Smokeless Tobacco Use: Never     Passive Exposure: Never     IMAGING:  Imaging read by myself and interpreted as follows:    February 01, 2023:  Three-view x-ray of the left hip including AP pelvis and AP and lateral of the left hip demonstrates maintenance of the position of the implants without change in position or evidence of subsidence or fracture.  No evidence of complication.  The left leg is approximately centimeter longer than the right with mildly increased offset.      January 18, 2023:  3 view x-ray of the left hip including AP pelvis, AP and lateral, demonstrates a well positioned left total hip arthroplasty without signs of loosening or complication. Leg lengths appear to be equal.     Dec 08, 2022:  3 view x-ray of the left hip including AP pelvis, and AP and lateral demonstrating severe joint space narrowing with evidence of osteophytosis,  subchondral sclerosis, and subchondral cyst formation.     2 view x-ray of the lumbar spine, sitting and standing, demonstrates facet joint arthropathy and decreased disc space at multiple levels. The change in SS is 21.2.     September 25, 2022:  3 view x-ray of the left hip including AP pelvis and AP and crosstable lateral of the left hip demonstrate significant joint space loss acetabular joint of approximately 90%.  There is osteophytosis,  subchondral sclerosis and subchondral cyst formation.      PHYSICAL EXAMINATION:  Ht 1.575 m (5\' 2" )   Wt 67.1 kg (148 lb)   BMI 27.07 kg/m   Body mass index is 27.07 kg/m.  Wt Readings from Last 3 Encounters:   04/12/23 67.1 kg (148 lb)   03/01/23 67.1 kg (148 lb)   03/01/23 65.8 kg (145 lb)       GENERAL: Alert and oriented x3, in no acute distress.  HEENT: Normocephalic, atraumatic.  MSK: Left Hip Exam     Tenderness   The patient is experiencing no tenderness.     Range of Motion   Flexion:  110   External rotation:  30   Internal rotation: 10     Muscle Strength   Abduction: 5/5   Adduction: 5/5   Flexion: 5/5     Other   Erythema: absent  Scars: present  Sensation: normal  Pulse: present    Comments:  Incision consistent with posterior lateral approach is clean dry and intact.  No erythema and no tenderness to palpation.  Well-healed        ASSESSMENT & PLAN:    Apr 12, 2023:  3 months status post left total hip arthroplasty, posterolateral approach.  Assessment: Patient presents today after calling after-hours services this weekend due to swelling in her left ankle and foot.  She denies ever having any pain or redness, simply swelling.  She was instructed to go to the emergency department if she began to develop shortness of breath or calf pain/swelling/discoloration.  She denies ever feeling the symptoms but was instructed to come into the office to be seen on Monday.  She is in no pain today and states that she never hit her ankle or anything and does not remember  an injury.  She has no tenderness to palpation just a small area of effusion just anterior to her lateral malleolus.  Since she called her swelling has improved by almost 90%.  She denies ever having any hip pain or pain down her leg.  Plan: Original plan for home PT and then transition to physical therapy  Follow up: In 2 months to check on her progress.  No x-rays will be needed.      March 01, 2023:  6-week status post left total hip arthroplasty through posterior lateral approach.  She is doing very well and she is very happy.  She is not having any pain and she is able to ambulate short distances without her rollator but is not completely steady on her feet yet.  She has graduated from home physical therapy and is starting outpatient physical therapy later this week.  I like to see her again in 2 months to check on her progress.  No x-rays needed at that visit.      February 01, 2023:  3 and half weeks status post left total hip arthroplasty through the posterior lateral approach.  Patient had a fall about 4 days ago when she slipped on the hardwood floor wearing socks without grippers on the.  She reports that she is feeling much better and she is ambulating well today.  She is complaining more of left shoulder pain than her hip.  She has seen Dr. Guido Sander in the past and they urged her to make a new appointment with him.  I will have home health work with the patient for the next 2 weeks as well as do a home assessment.  If she needs further physical therapy following those 2 weeks we can order outpatient physical therapy.  She does have a small abrasion in the gluteal cleft which is not concerning and seems to be healing well.  I will see her in 6 weeks and no x-rays will be needed unless there is any increasing pain.    January 18, 2023:  10 days s/p left THA, lateral approach. Pt is continuing to improve. Her pain fluctuates based off of her activity level. She was getting up  and walking 8-10 steps every  hour but this started to increase her pain so they have cut it back to every 2 hours but continue to do exercise while she is seated. She had an allergic reaction to the oxycodone that was given to her post op and she developed intense itching. She stopped the oxycodone once this started and I prescribed her hydroxyzine due to her allergy to benadryl. The itching has subsided since. She is currently taking tramadol for her pain every 8 hours along with tylenol and reports that this is helping with her pain. She is also taking the eliquis prescribed to her due to her aspirin allergy. She denies calf pain and has no calf pain, swelling, and negative homan's on exam. Her incision is clean, dry, and intact without ttp or erythema. She states that most of her pain is around her incision. She continues to walk with a walker for support. We will have her follow up with Dr. Lorin Picket on 4 weeks.     She also asked for lidocaine patches today, which were prescribed, and I will refill her tramadol when it is due this coming Friday.       December 08, 2022:  Primary osteoarthritis of the left hip. Pt is here for her H&P appointment prior to her 12/23/22 THA. 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 She has been cleared by her PCP for surgery and will be following up with her hematologist prior to her surgery for clearance due to her chronic iron deficiency anemia and low hemoglobin numbers on her last labs. They are out of the [facility] network, so they will be faxing that over once completed. She receives iron infusions every other month and had her last one in November.     She has allergies to aspirin and diphenhydramine. Due to this she will get lovenox 30 mg as DVT prophylaxis. Due to her history of GI bleed, we will not prescribe meloxiam. I will order her post operative medications once she receives  clearance from hematology.     September 25, 2022:  We will continue to plan on doing a left total hip arthroplasty through an anterolateral approach.  Again addressed the benefits and risks of anterolateral versus direct anterior the patient would like to continue the anterior lateral plan.  She does need to improve her health and get stabilized with recent GI bleed.  We will hope to move forward after the first of the year.    Apr 20, 2022:  She failed conservative treatment with Tylenol, ibuprofen and gabapentin.  She did get a left hip corticosteroid injection intra-articularly which gave her only a month of greater than 90% relief of her pain.  At this point would like to go forward with a left total hip replacement through the anterolateral approach.  I did discuss with her the risks and benefits of a direct anterior versus anterolateral approach and she did agree to go forward with anterolateral approach.  She has an overhanging pannus that reaches down the thigh quite a bit.  She is currently working on adjusting her thyroid medications to get her hypothyroidism under control.  Her other medical comorbidities under medication control at this time.  She is currently working in childcare and would like to go forward with surgery in July as her last day of work is the end of June.  Past Medical History:   Diagnosis Date    Anemia     Asthma     Colon polyp     DDD (degenerative disc disease) 02/15/2009    Depression 12/18/2011    DVT (deep venous thrombosis) (HCC) 10/23/2009    Left leg:  No DVT.      Essential hypertension, benign     History of blood transfusion     History of echocardiogram 03/20/2004    EF 70%.  Borderline DDfx.  No significant valvular pathology.    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  Neg EKG on pharm stress test.    Thyroid disease     hypothyroidism       Family History   Problem Relation Age of Onset    Hypertension Sister         x3    Hypertension Mother     Cancer Mother     Heart Surgery Sister     Heart Disease Sister     Hypertension Paternal Uncle     Cancer Maternal Aunt         breast    Breast Cancer Maternal Aunt     Glaucoma Son     Heart Defect Son     Diabetes Paternal Uncle     Heart Attack Sister        Current Outpatient Medications   Medication Sig Dispense Refill    levothyroxine (SYNTHROID) 100 MCG tablet take 1 tablet by mouth once daily 90 tablet 1    montelukast (SINGULAIR) 10 MG tablet TAKE 1 TABLET EVERY DAY 90 tablet 3    vitamin D 25 MCG (1000 UT) CAPS Take 1 capsule by mouth daily      vitamin C (ASCORBIC ACID) 500 MG tablet Take 1 tablet by mouth daily      Multiple Vitamins-Minerals (MULTIVITAMIN ADULTS 50+ PO) Take 1 tablet by mouth daily      amLODIPine (NORVASC) 5 MG tablet Take 1 tablet by mouth daily 90 tablet 3    Misc Natural Products (NEURIVA PO) Take 1 tablet by mouth in the morning and at bedtime      NONFORMULARY Take 1 tablet by mouth every evening Viviscal -1 tablet every evening for hair and scalp      gabapentin (NEURONTIN) 300 MG capsule Take 1 capsule by mouth 3 times daily for 180 days. Max Daily Amount: 900 mg (Patient taking differently: Take 1 capsule by mouth 3 times daily. Taking one in the morning and two in the evening) 270 capsule 1    potassium chloride (KLOR-CON M) 10 MEQ extended release tablet TAKE 1 TABLET EVERY DAY 90 tablet 3    atorvastatin  (LIPITOR) 40 MG tablet TAKE 1 TABLET EVERY DAY (NEED MD APPOINTMENT) 90 tablet 3  albuterol sulfate HFA (PROVENTIL;VENTOLIN;PROAIR) 108 (90 Base) MCG/ACT inhaler Inhale 2 puffs into the lungs every 4 hours as needed for Wheezing or Shortness of Breath 18 g 5    cyanocobalamin 1000 MCG tablet Take 1 tablet by mouth every evening      ZINC PO Take 1 tablet by mouth daily      iron dextran complex (INFED) 50 MG/ML injection Infuse 19.5 mLs intravenously Pt gets these every other month      latanoprost (XALATAN) 0.005 % ophthalmic solution Place 1 drop into both eyes nightly      lidocaine (LIDODERM) 5 % Place 1 patch onto the skin every 24 hours 12 hours on and 12 hours off      acetaminophen (TYLENOL) 500 MG tablet Take 2 tablets by mouth in the morning and 2 tablets at noon and 2 tablets in the evening. 180 tablet 0     No current facility-administered medications for this visit.        Allergies   Allergen Reactions    Aspirin Other (See Comments)     GI distress,     Diphenhydramine Other (See Comments)     Muscle jerking    Pollen Extract      Other reaction(s): Not Reported This Time       Past Surgical History:   Procedure Laterality Date    COLONOSCOPY N/A 04/25/2020    SCREENING COLONOSCOPY performed by Raynelle Jan, MD at [facility] ENDOSCOPY    COLONOSCOPY  02-24-12    normal (Dr. Adelene Idler)    COLONOSCOPY FLX DX W/COLLJ [facility] WHEN [facility]  02-2007    +polyp(tubular adenoma); Dr Adelene Idler    HERNIA REPAIR      3x    HYSTERECTOMY (CERVIX STATUS UNKNOWN)      NEUROLOGICAL SURGERY  03-16-2009    s/p ACD & Fusion (Dr.P.Gurtner)    TOTAL HIP ARTHROPLASTY Left 01/08/2023    LEFT HIP TOTAL ARTHROPLASTY LATERAL APPROACH performed by Heloise Beecham, DO at [facility] MAIN OR    TUBAL LIGATION      UPPER GASTROINTESTINAL ENDOSCOPY N/A 06/12/2022    ENTEROSCOPY PUSH DIAGNOSTIC w/small intestinal bipolar cautery of multiple AVMs performed by Raynelle Jan, MD at [facility] ENDOSCOPY       Social History     Socioeconomic History    Marital  status: Divorced     Spouse name: Not on file    Number of children: Not on file    Years of education: Not on file    Highest education level: Not on file   Occupational History    Not on file   Tobacco Use    Smoking status: Former     Current packs/day: 0.00     Average packs/day: 1 pack/day for 27.0 years (27.0 ttl pk-yrs)     Types: Cigarettes     Start date: 10/16/1977     Quit date: 04/25/2004     Years since quitting: 18.9     Passive exposure: Never    Smokeless tobacco: Never   Vaping Use    Vaping Use: Never used   Substance and Sexual Activity    Alcohol use: No    Drug use: Never    Sexual activity: Not Currently   Other Topics Concern    Not on file   Social History Narrative    Not on file     Social Determinants of Health     Financial Resource Strain: Low Risk  (01/04/2023)  Overall Financial Resource Strain (CARDIA)     Difficulty of Paying Living Expenses: Not hard at all   Food Insecurity: No Food Insecurity (01/08/2023)    Hunger Vital Sign     Worried About Running Out of Food in the Last Year: Never true     Ran Out of Food in the Last Year: Never true   Transportation Needs: No Transportation Needs (02/26/2023)    OASIS A1250: Transportation     Lack of Transportation (Medical): No     Lack of Transportation (Non-Medical): No     Patient Unable or Declines to Respond: No   Physical Activity: Inactive (01/04/2023)    Exercise Vital Sign     Days of Exercise per Week: 0 days     Minutes of Exercise per Session: 0 min   Stress: No Stress Concern Present (01/04/2023)    [employer] of Occupational Health - Occupational Stress Questionnaire     Feeling of Stress : Not at all   Social Connections: Feeling Socially Integrated (02/26/2023)    OASIS D0700: Social Isolation     Frequency of experiencing loneliness or isolation: Never   Intimate Partner Violence: Not At Risk (01/04/2023)    Humiliation, Afraid, Rape, and Kick questionnaire     Fear of Current or Ex-Partner: No     Emotionally Abused: No      Physically Abused: No     Sexually Abused: No   Housing Stability: Low Risk  (01/08/2023)    Housing Stability Vital Sign     Unable to Pay for Housing in the Last Year: No     Number of Places Lived in the Last Year: 1     Unstable Housing in the Last Year: No       REVIEW OF SYSTEMS:      No changes from previous review of systems unless noted.      Prescription medication management discussed with patient.     Electronically signed by: Clerance Lav, PA-C    Note: This note was completed using voice recognition software.  Any typographical/name errors or mistakes are unintentional.

## 2023-04-13 ENCOUNTER — Ambulatory Visit: Payer: MEDICARE | Primary: Internal Medicine

## 2023-04-13 ENCOUNTER — Inpatient Hospital Stay: Admit: 2023-04-13 | Payer: MEDICARE | Primary: Internal Medicine

## 2023-04-13 NOTE — Progress Notes (Signed)
PHYSICAL / OCCUPATIONAL THERAPY - DAILY TREATMENT NOTE    Patient Name: Elaine [name]    Date: 04/13/2023    DOB: [birth year]  Insurance: Payor: [employer] MEDICARE / Plan: [employer] GOLD PLUS HMO / Product Type: *No Product type* /      Patient DOB verified Yes     Visit #   Current / Total 12 16   Time   In / Out 1130 am  1214 pm    Pain   In / Out 0/10 0/10    Subjective Functional Status/Changes: Patient reports no left hip pain today. Patient explains that she drove today for the first time and had no issues.     TREATMENT AREA =  Pain in left hip [M25.552]  Pain in left knee [M25.562]  Other low back pain [M54.59]    OBJECTIVE         Therapeutic Procedures:    Tx Min Billable or 1:1 Min (if diff from Tx Min) Procedure, Rationale, Specifics   20  97110 Therapeutic Exercise (timed):  increase ROM, strength, coordination, balance, and proprioception to improve patient's ability to progress to PLOF and address remaining functional goals. (see flow sheet as applicable)     Details if applicable:       24  97530 Therapeutic Activity (timed):  use of dynamic activities replicating functional movements to increase ROM, strength, coordination, balance, and proprioception in order to improve patient's ability to progress to PLOF and address remaining functional goals.  (see flow sheet as applicable)     Details if applicable:            Details if applicable:            Details if applicable:            Details if applicable:     44  MC BC Totals Reminder: bill using total billable min of TIMED therapeutic procedures (example: do not include dry needle or estim unattended, both untimed codes, in totals to left)  8-22 min = 1 unit; 23-37 min = 2 units; 38-52 min = 3 units; 53-67 min = 4 units; 68-82 min = 5 units   Total Total     [x]   Patient Education billed concurrently with other procedures   [x]  Review HEP    []  Progressed/Changed HEP, detail:    []  Other detail:       Objective Information/Functional  Measures/Assessment    Patient presents to today's session with no left hip pain  Patient performed exercises, as per flow sheet, to further assist with increasing left hip strength and ROM for functional tasks and ambulating with no AD  Patient experienced a slight increase in left knee pain with step ups today. Patient tolerated all other exercises well  Patient ambulated 180 ft with no AD or LOB  Will continue to progress patient as tolerated and able       Patient will continue to benefit from skilled PT / OT services to modify and progress therapeutic interventions, analyze and address functional mobility deficits, analyze and address ROM deficits, analyze and address strength deficits, analyze and address soft tissue restrictions, analyze and cue for proper movement patterns, analyze and modify for postural abnormalities, and instruct in home and community integration to address functional deficits and attain remaining goals.    Progress toward goals / Updated goals:  []   See Progress Note/Recertification    1.Independent with HEP.  EVAL: N/A  PN:1 x day  03/08/23  met    2. Decrease max pain to <3/10 to assist with completion of ADLs and IADLs without significant pain.   PN: 4/10. 04/02/23 progressing  Current: Patient reports 0/10 pain today. 04/13/23     3. Pt is able to negotiate stair with reciprocal gait pattern and 1 railing for return to PLOF.   PN: Pt ascended and descended x4 6'' stairs, reciprocal using 1 handrail, with SUP and cues, poor eccentric control LLE 03/15/23, progressing    pt ascend/descend 6" stairs (B) HR with cues for correct sequence 03/24/23  Current: Patient able to negotiate 6" stairs with reciprocal pattern and B HR. 04/13/23     Long Term Goals: To be accomplished in  6-8  weeks:  1.  Decrease max pain <1/10 to assist with patient able to walk community distances and return to riding her bike.  PN: 4/10   04/02/23 progressing  Current: Patient reports 0/10 pain today. 04/13/23     2.  Improve  FOTO Functional Status Score by 11 points in order to show significant functional improvement.  PN: 49 . 04/02/23 progressing  Current: Ongoing, will assess at next PN. 04/13/23     3.  Improve B/L hip flexion and abduction and knee flexion and extension to at least 4/5 MMT to return to PLOF.   PN: hip flex  left/right 4    knee flex (B) 4+   knee ext  (B) 4  hip  abd   (B) 4    04/02/23  progressing   Current: Ongoing, will assess at next PN. 04/13/23     4. Pt is able to walk at least 300 ft without AD.  PN: pt ambulated 251 ft with rollator minimal cues for upright posture SBA    219 ft with no  AD  minimal cues for upright posture  04/02/23  Current: Patient was able to ambulate 180 ft with no AD or LOB. 04/13/23     Next PN/ RC due 05/02/23  Auth due (visit number/ date) 15 visits expire 05/03/23    PLAN  - Continue Plan of Care  - Upgrade activities as tolerated    Larene Pickett, PTA    04/13/2023    12:47 PM    Future Appointments   Date Time Provider Department Center   04/16/2023 11:30 AM Katheran James, PT [facility]   04/20/2023 11:30 AM Larene Pickett, PTA [facility]   04/20/2023  2:45 PM [facility] RM 4 3D [facility] [place]   05/10/2023 10:30 AM Heloise Beecham, DO [facility]   05/10/2023  1:20 PM Duplain, Bobette Mo, PA-C [facility]   07/02/2023 11:40 AM Marvia Pickles, MD [facility]   09/16/2023 10:20 AM Reather Converse, PA-C [facility]

## 2023-04-16 ENCOUNTER — Inpatient Hospital Stay: Admit: 2023-04-16 | Payer: MEDICARE | Primary: Internal Medicine

## 2023-04-16 NOTE — Progress Notes (Signed)
PHYSICAL / OCCUPATIONAL THERAPY - DAILY TREATMENT NOTE    Patient Name: Elaine [name]    Date: 04/16/2023    DOB: 1951-01-18  Insurance: Payor: HU[employer]NA MEDICARE / Plan: HU[employer]NA GOLD PLUS HMO / Product Type: *No Product type* /      Patient DOB verified Yes     Visit #   Current / Total 13 16   Time   In / Out 1130 1215   Pain   In / Out 5 left knee pain  0 left hip   Subjective Functional Status/Changes: Denies left hip pain, notes her pain is her left knee and lower back.      TREATMENT AREA =  No referral diagnosis.    OBJECTIVE         Therapeutic Procedures:    Tx Min Billable or 1:1 Min (if diff from Tx Min) Procedure, Rationale, Specifics   30 25 97110 Therapeutic Exercise (timed):  increase ROM, strength, coordination, balance, and proprioception to improve patient's ability to progress to PLOF and address remaining functional goals. (see flow sheet as applicable)     Details if applicable:       15 15 97530 Therapeutic Activity (timed):  use of dynamic activities replicating functional movements to increase ROM, strength, coordination, balance, and proprioception in order to improve patient's ability to progress to PLOF and address remaining functional goals.  (see flow sheet as applicable)     Details if applicable:            Details if applicable:            Details if applicable:            Details if applicable:     45 40 MC BC Totals Reminder: bill using total billable min of TIMED therapeutic procedures (example: do not include dry needle or estim unattended, both untimed codes, in totals to left)  8-22 min = 1 unit; 23-37 min = 2 units; 38-52 min = 3 units; 53-67 min = 4 units; 68-82 min = 5 units   Total Total     [x]   Patient Education billed concurrently with other procedures   [x]  Review HEP    []  Progressed/Changed HEP, detail:    []  Other detail:       Objective Information/Functional Measures/Assessment    VC exercises and technique  Denies left hip pain, reports left knee pain.   Increased  exercises to 2 1/2 # B LE  Issued green band for hip abd in sitting and shown at her request bicep curl and rows with band      Patient will continue to benefit from skilled PT / OT services to modify and progress therapeutic interventions, analyze and address functional mobility deficits, analyze and address ROM deficits, analyze and address strength deficits, analyze and address soft tissue restrictions, analyze and cue for proper movement patterns, analyze and modify for postural abnormalities, and instruct in home and community integration to address functional deficits and attain remaining goals.    Progress toward goals / Updated goals:  []   See Progress Note/Recertification  1.Independent with HEP.  EVAL: N/A  PN:1 x day  03/08/23 met  CURRENT 1x day 04/16/23     2. Decrease max pain to <3/10 to assist with completion of ADLs and IADLs without significant pain.   PN: 4/10. 04/02/23 progressing  Current: Patient reports 0/10 pain today. 04/16/23     3. Pt is able to negotiate stair with reciprocal gait  pattern and 1 railing for return to PLOF.   PN: Pt ascended and descended x4 6'' stairs, reciprocal using 1 handrail, with SUP and cues, poor eccentric control LLE 03/15/23, progressing    pt ascend/descend 6" stairs (B) HR with cues for correct sequence 03/24/23  Current: Patient able to negotiate 6" stairs 4x  with reciprocal pattern and 1-2 HR. 04/16/23     Long Term Goals: To be accomplished in  6-8  weeks:  1.  Decrease max pain <1/10 to assist with patient able to walk community distances and return to riding her bike.  PN: 4/10   04/02/23 progressing  Current: Patient reports 0/10 pain today. 04/16/23     2.  Improve FOTO Functional Status Score by 11 points in order to show significant functional improvement.  PN: 49 . 04/02/23 progressing  Current: Ongoing, will assess at next PN. 04/16/23     3.  Improve B/L hip flexion and abduction and knee flexion and extension to at least 4/5 MMT to return to PLOF.   PN: hip  flex  left/right 4    knee flex (B) 4+   knee ext  (B) 4  hip  abd   (B) 4    04/02/23  progressing   Current: Ongoing, will assess at next PN. 04/16/23     4. Pt is able to walk at least 300 ft without AD.  PN: pt ambulated 251 ft with rollator minimal cues for upright posture SBA    219 ft with no  AD  minimal cues for upright posture  04/02/23  Current: Patient was able to ambulate 180 ft with no AD or LOB. 04/13/23   reports she is walking daily with her daughter in the mornings about 20 minutes at at time and she uses her SC for safety. 04/16/23     Next PN/ RC due 05/02/23  Auth due (visit number/ date) 15 visits expire 05/03/23  PLAN  - Continue Plan of Care  - Upgrade activities as tolerated    Katheran James, PT    04/16/2023    11:55 AM    Future Appointments   Date Time Provider Department Center   04/20/2023 11:30 AM Larene Pickett, PTA [facility]   04/20/2023  2:45 PM [facility] RM 4 3D [facility] [place]   05/10/2023 10:30 AM Westcott, Earle Gell, DO [facility]   05/10/2023  1:20 PM Duplain, Bobette Mo, PA-C [facility]   07/02/2023 11:40 AM Marvia Pickles, MD [facility]   09/16/2023 10:20 AM Reather Converse, PA-C [facility] [place]

## 2023-04-20 ENCOUNTER — Inpatient Hospital Stay: Admit: 2023-04-20 | Payer: MEDICARE | Primary: Internal Medicine

## 2023-04-20 ENCOUNTER — Ambulatory Visit: Payer: MEDICARE | Primary: Internal Medicine

## 2023-04-20 NOTE — Progress Notes (Signed)
PHYSICAL / OCCUPATIONAL THERAPY - DAILY TREATMENT NOTE    Patient Name: Elaine [name]    Date: 04/20/2023    DOB: 15-Jan-1951  Insurance: Payor: HU[employer]NA MEDICARE / Plan: HU[employer]NA GOLD PLUS HMO / Product Type: *No Product type* /      Patient DOB verified Yes     Visit #   Current / Total 14 16   Time   In / Out 1129 am  1212 pm    Pain   In / Out 0/10  0/10    Subjective Functional Status/Changes: "My back is bothering me but my knee and hip are doing good."     TREATMENT AREA =  Pain in left hip [M25.552]  Pain in left knee [M25.562]  Other low back pain [M54.59]    OBJECTIVE         Therapeutic Procedures:    Tx Min Billable or 1:1 Min (if diff from Tx Min) Procedure, Rationale, Specifics   18  97110 Therapeutic Exercise (timed):  increase ROM, strength, coordination, balance, and proprioception to improve patient's ability to progress to PLOF and address remaining functional goals. (see flow sheet as applicable)     Details if applicable:       25  97530 Therapeutic Activity (timed):  use of dynamic activities replicating functional movements to increase ROM, strength, coordination, balance, and proprioception in order to improve patient's ability to progress to PLOF and address remaining functional goals.  (see flow sheet as applicable)     Details if applicable:  Self care on progression of activities at home such as donning shoes and socks          Details if applicable:            Details if applicable:            Details if applicable:     43  MC BC Totals Reminder: bill using total billable min of TIMED therapeutic procedures (example: do not include dry needle or estim unattended, both untimed codes, in totals to left)  8-22 min = 1 unit; 23-37 min = 2 units; 38-52 min = 3 units; 53-67 min = 4 units; 68-82 min = 5 units   Total Total     [x]   Patient Education billed concurrently with other procedures   [x]  Review HEP    []  Progressed/Changed HEP, detail:    []  Other detail:       Objective  Information/Functional Measures/Assessment    Patient presents to today's session with decreased reports of left hip pain when compared to last session. Patient performed exercises, as per flow sheet, to further assist with increasing left hip strength in order to ambulate safely in the community. Patient tolerated exercises well with no increased reports of pain. Reviewed hip protocol and suggested timeframe to be able to independently donn socks at shoes. Patient verbalized understanding. No increase in left hip pain was reported at the end of today's session. Will continue to progress patient as tolerated and able.      Patient will continue to benefit from skilled PT / OT services to modify and progress therapeutic interventions, analyze and address functional mobility deficits, analyze and address ROM deficits, analyze and address strength deficits, analyze and address soft tissue restrictions, analyze and cue for proper movement patterns, analyze and modify for postural abnormalities, and instruct in home and community integration to address functional deficits and attain remaining goals.    Progress toward goals / Updated goals:  []   See Progress Note/Recertification    1.Independent with HEP.  EVAL: N/A  PN:1 x day  03/08/23 met  CURRENT 1x day 04/20/23     2. Decrease max pain to <3/10 to assist with completion of ADLs and IADLs without significant pain.   PN: 4/10. 04/02/23 progressing  Current: Patient reports 0/10 pain today. 04/20/23     3. Pt is able to negotiate stair with reciprocal gait pattern and 1 railing for return to PLOF.   PN: Pt ascended and descended x4 6'' stairs, reciprocal using 1 handrail, with SUP and cues, poor eccentric control LLE 03/15/23, progressing    pt ascend/descend 6" stairs (B) HR with cues for correct sequence 03/24/23  Current: Patient able to negotiate 6" stairs 4x  with reciprocal pattern and 1-2 HR. 04/20/23     Long Term Goals: To be accomplished in  6-8  weeks:  1.  Decrease  max pain <1/10 to assist with patient able to walk community distances and return to riding her bike.  PN: 4/10   04/02/23 progressing  Current: Patient reports 0/10 pain today. 04/20/23     2.  Improve FOTO Functional Status Score by 11 points in order to show significant functional improvement.  PN: 49 . 04/02/23 progressing  Current: Ongoing, will assess at next PN. 04/20/23     3.  Improve B/L hip flexion and abduction and knee flexion and extension to at least 4/5 MMT to return to PLOF.   PN: hip flex  left/right 4    knee flex (B) 4+   knee ext  (B) 4  hip  abd   (B) 4    04/02/23  progressing   Current: Ongoing, will assess at next PN. 04/20/23     4. Pt is able to walk at least 300 ft without AD.  PN: pt ambulated 251 ft with rollator minimal cues for upright posture SBA    219 ft with no  AD  minimal cues for upright posture  04/02/23  Current: Patient reports she is walking daily with her daughter in the mornings about 20 minutes at at time and she uses her SC for safety. 04/20/23       Next PN/ RC due 05/02/23  Auth due (visit number/ date) 15 visits expire 05/03/23    PLAN  - Continue Plan of Care  - Upgrade activities as tolerated    Larene Pickett, PTA    04/20/2023    1:05 PM    Future Appointments   Date Time Provider Department Center   04/20/2023  2:45 PM [facility] RM 4 3D [facility]   04/28/2023  2:50 PM Smalls, Crystal, PTA [facility]   05/10/2023 10:30 AM Heloise Beecham, DO [facility]   07/02/2023 11:40 AM Marvia Pickles, MD [facility]   09/16/2023 10:20 AM Reather Converse, PA-C [facility] [place]

## 2023-04-21 ENCOUNTER — Encounter

## 2023-04-22 ENCOUNTER — Inpatient Hospital Stay: Admit: 2023-04-22 | Payer: MEDICARE | Attending: Internal Medicine | Primary: Internal Medicine

## 2023-04-22 DIAGNOSIS — Z1231 Encounter for screening mammogram for malignant neoplasm of breast: Secondary | ICD-10-CM

## 2023-04-22 NOTE — Other (Signed)
Orders placed for diagnostic mammogram and US

## 2023-04-24 ENCOUNTER — Telehealth

## 2023-04-24 NOTE — Telephone Encounter (Signed)
Let pt know they need to do some additional studies of her breast. Nothing to worry about at this time. This happens occasionally.

## 2023-04-26 NOTE — Telephone Encounter (Signed)
Patient is aware 

## 2023-04-28 ENCOUNTER — Encounter: Payer: MEDICARE | Primary: Internal Medicine

## 2023-05-03 ENCOUNTER — Ambulatory Visit: Payer: MEDICARE | Attending: Orthopaedic Surgery | Primary: Internal Medicine

## 2023-05-05 ENCOUNTER — Inpatient Hospital Stay: Admit: 2023-05-05 | Payer: MEDICARE | Attending: Internal Medicine | Primary: Internal Medicine

## 2023-05-05 ENCOUNTER — Inpatient Hospital Stay: Payer: MEDICARE | Attending: Internal Medicine | Primary: Internal Medicine

## 2023-05-05 ENCOUNTER — Encounter

## 2023-05-05 DIAGNOSIS — R928 Other abnormal and inconclusive findings on diagnostic imaging of breast: Secondary | ICD-10-CM

## 2023-05-06 ENCOUNTER — Inpatient Hospital Stay: Admit: 2023-05-06 | Payer: MEDICARE | Primary: Internal Medicine

## 2023-05-06 NOTE — Progress Notes (Addendum)
PHYSICAL / OCCUPATIONAL THERAPY - DAILY TREATMENT NOTE    Patient Name: Elaine [name]    Date: 05/06/2023    DOB: 06/04/1951  Insurance: Payor: HUMANA MEDICARE / Plan: HUMANA GOLD PLUS HMO / Product Type: *No Product type* /      Patient DOB verified Yes     Visit #   Current / Total 1 8   Time   In / Out 9:30 10:21   Pain   In / Out 0 0   Subjective Functional Status/Changes: "I have my brace today."     TREATMENT AREA =  Pain in left hip [M25.552]  Pain in left knee [M25.562]  Other low back pain [M54.59]     OBJECTIVE         Therapeutic Procedures:    Tx Min Billable or 1:1 Min (if diff from Tx Min) Procedure, Rationale, Specifics   10  97110 Therapeutic Exercise (timed):  increase ROM, strength, coordination, balance, and proprioception to improve patient's ability to progress to PLOF and address remaining functional goals. (see flow sheet as applicable)     Details if applicable:       30  97530 Therapeutic Activity (timed):  use of dynamic activities replicating functional movements to increase ROM, strength, coordination, balance, and proprioception in order to improve patient's ability to progress to PLOF and address remaining functional goals.  (see flow sheet as applicable)     Details if applicable:  REASSESS GOALS/FOTO   11  97116 Gait Training (timed):    375 feet with no (assistive device) over level surfaces with no level of assist. Cuing for upright posture.  To improve safety and dynamic movement with household/community ambulation.  (see flow sheet as applicable)     Details if applicable:            Details if applicable:            Details if applicable:     51  MC BC Totals Reminder: bill using total billable min of TIMED therapeutic procedures (example: do not include dry needle or estim unattended, both untimed codes, in totals to left)  8-22 min = 1 unit; 23-37 min = 2 units; 38-52 min = 3 units; 53-67 min = 4 units; 68-82 min = 5 units   Total Total     [x]   Patient Education billed  concurrently with other procedures   [x]  Review HEP    []  Progressed/Changed HEP, detail:    []  Other detail:       Objective Information/Functional Measures/Assessment  FOTO 47  Functional Gains: walking and stand without can for short period,pain decreased pain at hip,perform light household chores, driving  Functional Deficits: standing and walking long period,lifting medium to heavy weights,ascend/descend stairs with step to pattern with 1HR, pain at left knee  % improvement: 90%  Pain   Average: 4/10       Best: 0/10     Worst: 8/10  Patient Goal: "get pain down to lowest number"   Elaine [name] has been seen for 15 treatments and has shown fair progress with PT. Pt reports 90% improvement. FOTO has decreased from 49 to 47 but pt reports she is getting better. Pain level on average is 4/10 which is intermittent or can continue all day. Pt is currently wearing back brace which is helping with pain at LB. Pt mainly c/o pain at left knee. [name] functional gains are walking and stand without cane for short period, pain has  decreased at hip, can perform light household chores, pt is driving. [name] functional deficits are standing and walking for long period,lifting medium to heavy weights,ascend/descend stairs reciprocal pattern with  1HR, and pain at left knee. Recommend continuation of PT for pain reduction, further strengthening,improve pt's gait and functional mobility.    Patient will continue to benefit from skilled PT / OT services to modify and progress therapeutic interventions, analyze and address functional mobility deficits, analyze and address ROM deficits, analyze and address strength deficits, analyze and address soft tissue restrictions, analyze and cue for proper movement patterns, analyze and modify for postural abnormalities, analyze and address imbalance/dizziness, and instruct in home and community integration to address functional deficits and attain remaining goals.    Progress toward goals /  Updated goals:  []   See Progress Note/Recertification  1.Independent with HEP.  EVAL: N/A  PN:1 x day  03/08/23 met  CURRENT 1x day 04/20/23  met     2. Decrease max pain to <3/10 to assist with completion of ADLs and IADLs without significant pain.   PN: 4/10. 04/02/23 progressing  Current: Patient reports 4/10 pain today. 05/06/23  not met     3. Pt is able to negotiate stair with reciprocal gait pattern and 1 railing for return to PLOF.   PN: Pt ascended and descended x4 6'' stairs, reciprocal using 1 handrail, with SUP and cues, poor eccentric control LLE 03/15/23, progressing    pt ascend/descend 6" stairs (B) HR with cues for correct sequence 03/24/23  Current: Patient able to negotiate 6" stairs 4x  with reciprocal pattern and 1-2 HR. 05/06/23  not met     Long Term Goals: To be accomplished in  6-8  weeks:  1.  Decrease max pain <1/10 to assist with patient able to walk community distances and return to riding her bike.  PN: 4/10   04/02/23 progressing  Current: Patient reports 4/10 pain today. 05/06/23 not met     2.  Improve FOTO Functional Status Score by 11 points in order to show significant functional improvement.  PN: 49 . 04/02/23 progressing  Current: 47   05/06/23  not met     3.  Improve B/L hip flexion and abduction and knee flexion and extension to at least 4/5 MMT to return to PLOF.   PN: hip flex  left/right 4    knee flex (B) 4+   knee ext  (B) 4  hip  abd   (B) 4    04/02/23  progressing   Current:     hip  flex  (B) 4+   hip  abduction  (B) 4   knee flex  (B) 4+   ext   (B) 4      05/06/23  met     4. Pt is able to walk at least 300 ft without AD.  PN: pt ambulated 251 ft with rollator minimal cues for upright posture SBA    219 ft with no  AD  minimal cues for upright posture  04/02/23  Current: 338ft with no AD  cues for upright posture  05/06/23        Next PN/ RC due 05/02/23  Auth due (visit number/ date) 15 visits expire 05/03/23           PLAN  - Continue Plan of Care  - Other : [employer], PTA    05/06/2023    9:34 AM  Future Appointments   Date Time Provider Department Center   05/10/2023 10:30 AM Heloise Beecham, DO [facility]   07/02/2023 11:40 AM Marvia Pickles, MD [facility]   09/16/2023 10:20 AM Reather Converse, PA-C [facility] [place]

## 2023-05-06 NOTE — Progress Notes (Addendum)
[facility] PHYSICAL THERAPY  [address] [place] [address]  [phone]  CONTINUED PLAN OF CARE/RECERTIFICATION FOR PHYSICAL THERAPY          Patient Name: Elaine [name] DOB: [birth year]   Treatment/Medical Diagnosis: Pain in left hip [M25.552]  Pain in left knee [M25.562]  Other low back pain [M54.59]   Onset Date:     Referral Source: Warren Danes Start of Care [facility]): 03/03/2023    Prior Hospitalization: See Medical History Provider #: [id number]   Prior Level of Function: Independent - ADL and IADLs    Comorbidities:   HTN, arthritis, chronic back pain, chiari malformation      Vis[facility] from [facility]: 15 Missed Visits: 2     Progress to Goals:  1.Independent with HEP.  EVAL: N/A  PN:1 x day  03/08/23 met  CURRENT 1x day 04/20/23  met     2. Decrease max pain to <3/10 to assist with completion of ADLs and IADLs without significant pain.   PN: 4/10. 04/02/23 progressing  Current: Patient reports 4/10 pain today. 05/06/23  not met     3. Pt is able to negotiate stair with reciprocal gait pattern and 1 railing for return to PLOF.   PN: Pt ascended and descended x4 6'' stairs, reciprocal using 1 handrail, with SUP and cues, poor eccentric control LLE 03/15/23, progressing    pt ascend/descend 6" stairs (B) HR with cues for correct sequence 03/24/23  Current: Patient able to negotiate 6" stairs 4x  with reciprocal pattern and 1-2 HR. 05/06/23  not met     Long Term Goals: To be accomplished in  6-8  weeks:  1.  Decrease max pain <1/10 to assist with patient able to walk community distances and return to riding her bike.  PN: 4/10   04/02/23 progressing  Current: Patient reports 4/10 pain today. 05/06/23 not met     2.  Improve FOTO Functional Status Score by 11 points in order to show significant functional improvement.  PN: 49 . 04/02/23 progressing  Current: 47   05/06/23  not met     3.  Improve B/L hip flexion and abduction and knee flexion and extension to  at least 4/5 MMT to return to PLOF.   PN: hip flex  left/right 4    knee flex (B) 4+   knee ext  (B) 4  hip  abd   (B) 4    04/02/23  progressing   Current:     hip  flex  (B) 4+   hip  abduction  (B) 4   knee flex  (B) 4+   ext   (B) 4      05/06/23  met     4. Pt is able to walk at least 300 ft without AD.  PN: pt ambulated 251 ft with rollator minimal cues for upright posture SBA    219 ft with no  AD  minimal cues for upright posture  04/02/23  Current: 352ft with no AD  cues for upright posture  05/06/23  met    Key Functional Changes/Progress: walking and stand without can for short period,pain decreased pain at hip,perform light household chores, driving   Problem List: pain affecting function, decrease ROM, decrease strength, edema affection function, impaired gait/balance, decrease ADL/functional abilities, decrease activity tolerance, decrease flexibility/joint mobility, and decrease transfer abilities    Treatment Plan may include any combination of the following: 96295  Therapeutic Exercise, [id number] Neuromuscular Re-Education, 97140 Manual Therapy, 97530 Therapeutic Activity, 97535 Self Care/Home Management, 97014 Electrical Stim unattended, [address] Gait Training, and [id number] Ultrasound  Patient Goal(s) has been updated and includes: get pain down to lowest number"     Goals for this certification period include and are to be achieved in   4  WEEKS  1.Decrease max pain to <3/10 to assist with completion of ADLs and IADLs without significant pain.   PN 4/10         2.Pt is able to negotiate stair with reciprocal gait pattern and 1 railing for return to PLOF.          PN Patient able to negotiate 6" stairs 4x with reciprocal pattern and 1-2 HR  with cues for correct sequence           3.pt will be able to maintain rhomberg balance with NBOS for 20 sec with no LOB to perform light household chores with ease.            PN  not assessed           4. Improve FOTO Functional Status Score by 11 points in order to show  significant functional improvement.             PN 46    Frequency / Duration:   Patient to be seen   2   times per week for   4    WEEKS    Assessments/Recommendations: Elaine [name] has been seen for 15 treatments and has shown fair progress with PT. Pt reports 90% improvement. FOTO has decreased from 49 to 47 but pt reports she is getting better. Pain level on average is 4/10 which is intermittent or can continue all day. Pt is currently wearing back brace which is helping with pain at LB. Pt mainly c/o pain at left knee pain. [name] functional gains are walking and standing without cane for short period, pain has decreased at hip, can perform light household chores, pt is driving. [name] functional deficits are standing and walking for long period,lifting medium to heavy weights,ascend/descend stairs reciprocal pattern with  1HR, and pain at left knee. Recommend continuation of PT for pain reduction, further strengthening,improve pt's gait and functional mobility.    If you have any questions/comments please contact us directly at [phone].   Thank you for allowing [place] to assist in the care of your patient.    Certification Period: 05/07/23-06/06/23  Reporting Period (date from last assessment to current assessment): 04/02/23-05/06/23    Crystal  Smalls, PTA       05/06/2023       10:07 AM      ___ I have read the above report and request that my patient continue as recommended.   ___ I have read the above report and request that my patient continue therapy with the following changes/special instructions: ________________________________________________   ___ I have read the above report and request that my patient be discharged from therapy.     Physician's Signature:_________________________   DATE:_________   TIME:________                           Clerance Lav, PA-C    ** Signature, Date and Time must be completed for valid certification **  Please sign and fax to [facility] [phone].  Thank  you

## 2023-05-10 ENCOUNTER — Ambulatory Visit
Admit: 2023-05-10 | Discharge: 2023-05-10 | Payer: MEDICARE | Attending: Orthopaedic Surgery | Primary: Internal Medicine

## 2023-05-10 ENCOUNTER — Ambulatory Visit: Payer: MEDICARE | Primary: Internal Medicine

## 2023-05-10 VITALS — Ht 62.0 in | Wt 142.0 lb

## 2023-05-10 DIAGNOSIS — M1712 Unilateral primary osteoarthritis, left knee: Principal | ICD-10-CM

## 2023-05-10 MED ORDER — LIDOCAINE HCL 1 % IJ SOLN
1 | Freq: Once | INTRAMUSCULAR | Status: AC
Start: 2023-05-10 — End: 2023-05-10
  Administered 2023-05-10: 16:00:00 2 mL via INTRA_ARTICULAR

## 2023-05-10 MED ORDER — TRIAMCINOLONE ACETONIDE 40 MG/ML IJ SUSP
40 | Freq: Once | INTRAMUSCULAR | Status: AC
Start: 2023-05-10 — End: 2023-05-10
  Administered 2023-05-10: 16:00:00 80 mg via INTRA_ARTICULAR

## 2023-05-10 MED ORDER — DICLOFENAC SODIUM 1 % EX GEL
1 | Freq: Four times a day (QID) | CUTANEOUS | 5 refills | 18.00000 days | Status: AC
Start: 2023-05-10 — End: 2023-08-08

## 2023-05-10 NOTE — Assessment & Plan Note (Signed)
I explained the risks of injection/aspiration including but not limited to infection, pain, skin discoloration, and flushing. After obtaining written consent for left knee intra-articular injection the patient was prepped in normal sterile fashion with freeze spray and alcohol.  80mg kenalog and 2mL 2% lidocaine was injected .  The needle was withdrawn, the area was cleansed and a sterile bandage was applied.  The patient tolerated the procedure well.

## 2023-05-10 NOTE — Progress Notes (Signed)
Patient: NOKOMIS KAZMER                MRN: [id number]       SSN: [id number]  Date of Birth: 1951/10/30        AGE: 72 y.o.        SEX: female    PCP: Marvia Pickles, MD  05/10/23    Chief Complaint: Hip Pain (Left hip )      1. Primary osteoarthritis of left knee  Assessment & Plan:  I explained the risks of injection/aspiration including but not limited to infection, pain, skin discoloration, and flushing. After obtaining written consent for left knee intra-articular injection the patient was prepped in normal sterile fashion with freeze spray and alcohol.  80mg  kenalog and 2mL 2% lidocaine was injected .  The needle was withdrawn, the area was cleansed and a sterile bandage was applied.  The patient tolerated the procedure well.     Orders:  -     [[id number]] Knee 4V  -     DRAIN/INJECT LARGE JOINT/BURSA  -     triamcinolone acetonide (KENALOG-40) injection 80 mg; 80 mg, Intra-artICUlar, ONCE, 1 dose, On Mon 05/10/23 at 1145  -     lidocaine 1 % injection 2 mL; 2 mL, Intra-artICUlar, ONCE, 1 dose, On Mon 05/10/23 at 1145  -     diclofenac sodium (VOLTAREN) 1 % GEL; Apply 4 g topically 4 times daily, Topical, 4 TIMES DAILY Starting Mon 05/10/2023, Until Sun 08/08/2023, For 90 days, Disp-350 g, R-5, Normal  2. Status post total replacement of left hip  3. Pre-diabetes  4. History of GI bleed      HPI:  YEJIN FAUSNAUGH is a 72 y.o. female with chief complaint of   Chief Complaint   Patient presents with    Hip Pain     Lt      -Left total hip arthroplasty, posterior lateral approach, January 08, 2023, Dr. Lorin Picket    We have been planning on a left total hip arthroplasty through the anterolateral approach due to some pain in the lateral hip as well as a overhanging pannus.  She recently, in July, had a GI bleed and has had a cascade of medical issues since then.  She is hoping that she can have her hip replaced at the beginning of the year.        03/01/2023    10:36 AM   AMB PAIN ASSESSMENT   Location of Pain Hip    Location Modifiers Left   Severity of Pain 0     Tobacco Use: Medium Risk (05/10/2023)    Patient History     Smoking Tobacco Use: Former     Smokeless Tobacco Use: Never     Passive Exposure: Never     IMAGING:  Imaging read by myself and interpreted as follows:    May 10, 2023:  4 view x-ray of the left knee including AP, lateral, sunrise and notch views demonstrate tricompartmental osteoarthritic signs most affecting the lateral compartment which has bone-on-bone articulation with subchondral cysts and subchondral sclerosis.  There are tricompartmental osteophytes.  There is also subchondral sclerosis and mild lateral patellar tilt in the patellofemoral joint.      February 01, 2023:  Three-view x-ray of the left hip including AP pelvis and AP and lateral of the left hip demonstrates maintenance of the position of the implants without change in position or evidence of subsidence or fracture.  No evidence of  complication.  The left leg is approximately centimeter longer than the right with mildly increased offset.      January 18, 2023:  3 view x-ray of the left hip including AP pelvis, AP and lateral, demonstrates a well positioned left total hip arthroplasty without signs of loosening or complication. Leg lengths appear to be equal.     Dec 08, 2022:  3 view x-ray of the left hip including AP pelvis, and AP and lateral demonstrating severe joint space narrowing with evidence of osteophytosis, subchondral sclerosis, and subchondral cyst formation.     2 view x-ray of the lumbar spine, sitting and standing, demonstrates facet joint arthropathy and decreased disc space at multiple levels. The change in SS is 21.2.     September 25, 2022:  3 view x-ray of the left hip including AP pelvis and AP and crosstable lateral of the left hip demonstrate significant joint space loss acetabular joint of approximately 90%.  There is osteophytosis,  subchondral sclerosis and subchondral cyst formation.      PHYSICAL  EXAMINATION:  Ht 1.575 m (5\' 2" )   Wt 64.4 kg (142 lb)   BMI 25.97 kg/m   Body mass index is 25.97 kg/m.  Wt Readings from Last 3 Encounters:   05/10/23 64.4 kg (142 lb)   04/22/23 64.4 kg (142 lb)   04/12/23 67.1 kg (148 lb)       GENERAL: Alert and oriented x3, in no acute distress.  HEENT: Normocephalic, atraumatic.    MSK: Left Hip Exam     Tenderness   The patient is experiencing no tenderness.     Range of Motion   Flexion:  110   External rotation:  30   Internal rotation: 10     Muscle Strength   Abduction: 5/5   Adduction: 5/5   Flexion: 5/5     Other   Erythema: absent  Scars: present  Sensation: normal  Pulse: present    Comments:  Incision consistent with posterior lateral approach is clean dry and intact.  No erythema and no tenderness to palpation.  Well-healed        ASSESSMENT & PLAN:    May 10, 2023:  Left knee osteoarthritis and 50-month status post left total hip arthroplasty through posterior lateral approach.  She is having no pain in her hip but her knee is bothering her quite a bit.  She will get sharp pains in from time to time.  It hurts in both the medial and lateral sides of the joint.  We talked about over-the-counter medications and she is already taking Tylenol and that knee continues to hurt.  Due to her history of GI bleed she cannot take NSAIDs.  I offered her a cortisone injection and she would like to move forward with it.  We did discuss that with her prediabetes it can elevate her blood sugar and potentially her A1c slightly.  The shot was given and she tolerated it well.  I like to see her back and 3 months for reevaluation of the left knee.  I did prescribe her Voltaren gel.    Apr 12, 2023:  3 months status post left total hip arthroplasty, posterolateral approach.  Assessment: Patient presents today after calling after-hours services this weekend due to swelling in her left ankle and foot.  She denies ever having any pain or redness, simply swelling.  She was instructed to  go to the emergency department if she began to develop shortness of breath or  calf pain/swelling/discoloration.  She denies ever feeling the symptoms but was instructed to come into the office to be seen on Monday.  She is in no pain today and states that she never hit her ankle or anything and does not remember an injury.  She has no tenderness to palpation just a small area of effusion just anterior to her lateral malleolus.  Since she called her swelling has improved by almost 90%.  She denies ever having any hip pain or pain down her leg.  Plan: Original plan for home PT and then transition to physical therapy  Follow up: In 2 months to check on her progress.  No x-rays will be needed.      March 01, 2023:  6-week status post left total hip arthroplasty through posterior lateral approach.  She is doing very well and she is very happy.  She is not having any pain and she is able to ambulate short distances without her rollator but is not completely steady on her feet yet.  She has graduated from home physical therapy and is starting outpatient physical therapy later this week.  I like to see her again in 2 months to check on her progress.  No x-rays needed at that visit.      February 01, 2023:  3 and half weeks status post left total hip arthroplasty through the posterior lateral approach.  Patient had a fall about 4 days ago when she slipped on the hardwood floor wearing socks without grippers on the.  She reports that she is feeling much better and she is ambulating well today.  She is complaining more of left shoulder pain than her hip.  She has seen Dr. Guido Sander in the past and they urged her to make a new appointment with him.  I will have home health work with the patient for the next 2 weeks as well as do a home assessment.  If she needs further physical therapy following those 2 weeks we can order outpatient physical therapy.  She does have a small abrasion in the gluteal cleft which is not concerning and  seems to be healing well.  I will see her in 6 weeks and no x-rays will be needed unless there is any increasing pain.    January 18, 2023:  10 days s/p left THA, lateral approach. Pt is continuing to improve. Her pain fluctuates based off of her activity level. She was getting up and walking 8-10 steps every hour but this started to increase her pain so they have cut it back to every 2 hours but continue to do exercise while she is seated. She had an allergic reaction to the oxycodone that was given to her post op and she developed intense itching. She stopped the oxycodone once this started and I prescribed her hydroxyzine due to her allergy to benadryl. The itching has subsided since. She is currently taking tramadol for her pain every 8 hours along with tylenol and reports that this is helping with her pain. She is also taking the eliquis prescribed to her due to her aspirin allergy. She denies calf pain and has no calf pain, swelling, and negative homan's on exam. Her incision is clean, dry, and intact without ttp or erythema. She states that most of her pain is around her incision. She continues to walk with a walker for support. We will have her follow up with Dr. Lorin Picket on 4 weeks.     She also asked for lidocaine  patches today, which were prescribed, and I will refill her tramadol when it is due this coming Friday.       December 08, 2022:  Primary osteoarthritis of the left hip. Pt is here for her H&P appointment prior to her 12/23/22 THA. 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 She has been cleared by her PCP for surgery and will be following up with her hematologist prior to her surgery for clearance due to her chronic iron deficiency anemia and low hemoglobin numbers on her last labs. They are out of the [place] network, so they will be faxing that over once completed. She  receives iron infusions every other month and had her last one in November.     She has allergies to aspirin and diphenhydramine. Due to this she will get lovenox 30 mg as DVT prophylaxis. Due to her history of GI bleed, we will not prescribe meloxiam. I will order her post operative medications once she receives clearance from hematology.     September 25, 2022:  We will continue to plan on doing a left total hip arthroplasty through an anterolateral approach.  Again addressed the benefits and risks of anterolateral versus direct anterior the patient would like to continue the anterior lateral plan.  She does need to improve her health and get stabilized with recent GI bleed.  We will hope to move forward after the first of the year.    Apr 20, 2022:  She failed conservative treatment with Tylenol, ibuprofen and gabapentin.  She did get a left hip corticosteroid injection intra-articularly which gave her only a month of greater than 90% relief of her pain.  At this point would like to go forward with a left total hip replacement through the anterolateral approach.  I did discuss with her the risks and benefits of a direct anterior versus anterolateral approach and she did agree to go forward with anterolateral approach.  She has an overhanging pannus that reaches down the thigh quite a bit.  She is currently working on adjusting her thyroid medications to get her hypothyroidism under control.  Her other medical comorbidities under medication control at this time.  She is currently working in childcare and would like to go forward with surgery in July as her last day of work is the end of June.      Past Medical History:   Diagnosis Date    Anemia     Asthma     Colon polyp     DDD (degenerative disc disease) 02/15/2009    Depression 12/18/2011    DVT (deep venous thrombosis) (HCC) 10/23/2009    Left leg:  No DVT.      Essential hypertension, benign     History of blood transfusion     History of echocardiogram  03/20/2004    EF 70%.  Borderline DDfx.  No significant valvular pathology.    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  Neg EKG on pharm stress test.    Thyroid disease     hypothyroidism       Family History   Problem Relation Age of Onset    Hypertension Sister         x3    Hypertension Mother     Cancer Mother     Heart Surgery Sister     Heart Disease Sister     Hypertension Paternal Uncle     Cancer Maternal Aunt         breast    Breast Cancer Maternal Aunt     Glaucoma Son     Heart Defect Son     Diabetes Paternal Uncle     Heart Attack Sister        Current Outpatient Medications   Medication Sig Dispense Refill    diclofenac sodium (VOLTAREN) 1 % GEL Apply 4 g topically 4 times daily 350 g 5    levothyroxine (SYNTHROID) 100 MCG tablet take 1 tablet by mouth once daily 90 tablet 1    montelukast (SINGULAIR) 10 MG tablet TAKE 1 TABLET EVERY DAY 90 tablet 3    vitamin D 25 MCG (1000 UT) CAPS Take 1 capsule by mouth daily      vitamin C (ASCORBIC ACID) 500 MG tablet Take 1 tablet by mouth daily      Multiple Vitamins-Minerals (MULTIVITAMIN ADULTS 50+ PO) Take 1 tablet by mouth daily      amLODIPine (NORVASC) 5 MG tablet Take 1 tablet by mouth daily 90 tablet 3    Misc Natural Products (NEURIVA PO) Take 1 tablet by mouth in the morning and at bedtime      NONFORMULARY Take 1 tablet by mouth every evening Viviscal -1 tablet every evening for  hair and scalp      gabapentin (NEURONTIN) 300 MG capsule Take 1 capsule by mouth 3 times daily for 180 days. Max Daily Amount: 900 mg (Patient taking differently: Take 1 capsule by mouth 3 times daily. Taking one in the morning and two in the evening) 270 capsule 1    potassium chloride (KLOR-CON M) 10 MEQ extended release tablet TAKE 1 TABLET EVERY DAY 90 tablet 3    atorvastatin (LIPITOR) 40 MG tablet TAKE 1 TABLET EVERY DAY (NEED MD APPOINTMENT) 90 tablet 3    albuterol sulfate HFA (PROVENTIL;VENTOLIN;PROAIR) 108 (90 Base) MCG/ACT inhaler Inhale 2 puffs into the lungs every 4 hours as needed for Wheezing or Shortness of Breath 18 g 5    cyanocobalamin 1000 MCG tablet Take 1 tablet by mouth every evening      ZINC PO Take 1 tablet by mouth daily      iron dextran complex (INFED) 50 MG/ML injection Infuse 19.5 mLs intravenously Pt gets these every other month      latanoprost (XALATAN) 0.005 % ophthalmic solution Place 1 drop into both eyes nightly      lidocaine (LIDODERM) 5 % Place 1 patch onto the skin every 24 hours 12 hours on and 12 hours off      acetaminophen (TYLENOL) 500 MG tablet Take 2 tablets by mouth in the morning and 2 tablets at noon and 2 tablets in the evening. 180 tablet 0  Current Facility-Administered Medications   Medication Dose Route Frequency Provider Last Rate Last Admin    triamcinolone acetonide (KENALOG-40) injection 80 mg  80 mg Intra-artICUlar Once April Carlyon W, DO        lidocaine 1 % injection 2 mL  2 mL Intra-artICUlar Once Jyaire Koudelka W, DO            Allergies   Allergen Reactions    Aspirin Other (See Comments)     GI distress,     Diphenhydramine Other (See Comments)     Muscle jerking    Pollen Extract      Other reaction(s): Not Reported This Time       Past Surgical History:   Procedure Laterality Date    BREAST BIOPSY      BREAST SURGERY      COLONOSCOPY N/A 04/25/2020    SCREENING COLONOSCOPY performed by Raynelle Jan, MD at [facility] ENDOSCOPY     COLONOSCOPY  02-24-12    normal (Dr. Adelene Idler)    COLONOSCOPY FLX DX W/COLLJ [facility] WHEN P[facility]MD  02-2007    +polyp(tubular adenoma); Dr Adelene Idler    HERNIA REPAIR      3x    HYSTERECTOMY (CERVIX STATUS UNKNOWN)      NEUROLOGICAL SURGERY  03-16-2009    s/p ACD & Fusion (Dr.P.Gurtner)    TOTAL HIP ARTHROPLASTY Left 01/08/2023    LEFT HIP TOTAL ARTHROPLASTY LATERAL APPROACH performed by Heloise Beecham, DO at [facility] MAIN OR    TUBAL LIGATION      UPPER GASTROINTESTINAL ENDOSCOPY N/A 06/12/2022    ENTEROSCOPY PUSH DIAGNOSTIC w/small intestinal bipolar cautery of multiple AVMs performed by Raynelle Jan, MD at [facility] ENDOSCOPY       Social History     Socioeconomic History    Marital status: Divorced     Spouse name: Not on file    Number of children: Not on file    Years of education: Not on file    Highest education level: Not on file   Occupational History    Not on file   Tobacco Use    Smoking status: Former     Current packs/day: 0.00     Average packs/day: 1 pack/day for 27.0 years (27.0 ttl pk-yrs)     Types: Cigarettes     Start date: 10/16/1977     Quit date: 04/25/2004     Years since quitting: 19.0     Passive exposure: Never    Smokeless tobacco: Never   Vaping Use    Vaping Use: Never used   Substance and Sexual Activity    Alcohol use: No    Drug use: Never    Sexual activity: Not Currently   Other Topics Concern    Not on file   Social History Narrative    Not on file     Social Determinants of Health     Financial Resource Strain: Low Risk  (01/04/2023)    Overall Financial Resource Strain (CARDIA)     Difficulty of Paying Living Expenses: Not hard at all   Food Insecurity: No Food Insecurity (01/08/2023)    Hunger Vital Sign     Worried About Running Out of Food in the Last Year: Never true     Ran Out of Food in the Last Year: Never true   Transportation Needs: No Transportation Needs (02/26/2023)    OASIS A1250: Transportation     Lack of Transportation (Medical): No     Lack  of Transportation (Non-Medical): No      Patient Unable or Declines to Respond: No   Physical Activity: Inactive (01/04/2023)    Exercise Vital Sign     Days of Exercise per Week: 0 days     Minutes of Exercise per Session: 0 min   Stress: No Stress Concern Present (01/04/2023)    [employer] of Occupational Health - Occupational Stress Questionnaire     Feeling of Stress : Not at all   Social Connections: Feeling Socially Integrated (02/26/2023)    OASIS D0700: Social Isolation     Frequency of experiencing loneliness or isolation: Never   Intimate Partner Violence: Not At Risk (01/04/2023)    Humiliation, Afraid, Rape, and Kick questionnaire     Fear of Current or Ex-Partner: No     Emotionally Abused: No     Physically Abused: No     Sexually Abused: No   Housing Stability: Low Risk  (01/08/2023)    Housing Stability Vital Sign     Unable to Pay for Housing in the Last Year: No     Number of Places Lived in the Last Year: 1     Unstable Housing in the Last Year: No       REVIEW OF SYSTEMS:      No changes from previous review of systems unless noted.      Prescription medication management discussed with patient.     Electronically signed by: Heloise Beecham, DO    Note: This note was completed using voice recognition software.  Any typographical/name errors or mistakes are unintentional.

## 2023-05-12 ENCOUNTER — Inpatient Hospital Stay: Admit: 2023-05-12 | Payer: MEDICARE | Primary: Internal Medicine

## 2023-05-12 DIAGNOSIS — M25552 Pain in left hip: Secondary | ICD-10-CM

## 2023-05-12 NOTE — Progress Notes (Signed)
PHYSICAL / OCCUPATIONAL THERAPY - DAILY TREATMENT NOTE    Patient Name: Elaine [name]    Date: 05/12/2023    DOB: Apr 18, 1951  Insurance: Payor: [employer] MEDICARE / Plan: [employer] GOLD PLUS HMO / Product Type: *No Product type* /      Patient DOB verified Yes     Visit #   Current / Total 2 8   Time   In / Out 1208 pm  1253 pm    Pain   In / Out 0/10  0/10    Subjective Functional Status/Changes: Patient reports that she is feeling better overall. Patient states that she found out that she has a lot arthritis in her left knee and that is why it bothers her. "He gave me a cortisone shot and it felt better."     TREATMENT AREA =  Pain in left hip [M25.552]  Pain in left knee [M25.562]  Other low back pain [M54.59]     OBJECTIVE         Therapeutic Procedures:    Tx Min Billable or 1:1 Min (if diff from Tx Min) Procedure, Rationale, Specifics   25  97110 Therapeutic Exercise (timed):  increase ROM, strength, coordination, balance, and proprioception to improve patient's ability to progress to PLOF and address remaining functional goals. (see flow sheet as applicable)     Details if applicable:       20  97530 Therapeutic Activity (timed):  use of dynamic activities replicating functional movements to increase ROM, strength, coordination, balance, and proprioception in order to improve patient's ability to progress to PLOF and address remaining functional goals.  (see flow sheet as applicable)     Details if applicable:            Details if applicable:            Details if applicable:            Details if applicable:     45  MC BC Totals Reminder: bill using total billable min of TIMED therapeutic procedures (example: do not include dry needle or estim unattended, both untimed codes, in totals to left)  8-22 min = 1 unit; 23-37 min = 2 units; 38-52 min = 3 units; 53-67 min = 4 units; 68-82 min = 5 units   Total Total     [x]   Patient Education billed concurrently with other procedures   [x]  Review HEP    []   Progressed/Changed HEP, detail:    []  Other detail:       Objective Information/Functional Measures/Assessment    Patient presents today's session with no left hip pain. Patient performed exercises, as per flow sheet, to further assist with increasing left hip strength and balance for functional tasks. Added standing hamstring curls, leg press and Romberg stance. Verbal cues were required for added exercises. Patient responded well to today's overall session, as evident by, improved exercise tolerance and no increased reports of pain. Will continue to progress patient as tolerated and able.     Patient will continue to benefit from skilled PT / OT services to modify and progress therapeutic interventions, analyze and address functional mobility deficits, analyze and address ROM deficits, analyze and address strength deficits, analyze and address soft tissue restrictions, analyze and cue for proper movement patterns, and instruct in home and community integration to address functional deficits and attain remaining goals.    Progress toward goals / Updated goals:  []   See Progress Note/Recertification    1.Decrease  max pain to <3/10 to assist with completion of ADLs and IADLs without significant pain.   PN 4/10  Current: Patient reports 0/10 pain today. 05/12/23    2.Pt is able to negotiate stair with reciprocal gait pattern and 1 railing for return to PLOF.     PN Patient able to negotiate 6" stairs 4x with reciprocal pattern and 1-2 HR  with cues for correct sequence  Current: Patient able to negotiate 6'' stairs with reciprocal pattern and 1-2 HR. 05/11/23    3.pt will be able to maintain rhomberg balance with NBOS for 20 sec with no LOB to perform light household chores with ease.   PN  not assessed  Current: Initiated today. Patient was able to tolerate  NBOS for 20 sec with no LOB. 05/12/23     4. Improve FOTO Functional Status Score by 11 points in order to show significant functional improvement.    PN 46  Current:  Ongoing, will assess at next PN. 05/12/23    Next PN/ RC due 06/06/2023  Auth due (visit number/ date) 15 visits expire 07/05/2023    PLAN  - Continue Plan of Care  - Upgrade activities as tolerated    Larene Pickett, PTA    05/12/2023    9:57 AM    Future Appointments   Date Time Provider Department Center   05/12/2023 12:10 PM Larene Pickett, PTA [facility]   05/19/2023 12:10 PM Larene Pickett, PTA [facility]   05/26/2023 12:10 PM Katherina Mires, PT [facility]   06/02/2023 12:10 PM Katherina Mires, PT [facility]   07/02/2023 11:40 AM Marvia Pickles, MD [facility]   08/16/2023  9:15 AM Heloise Beecham, DO [facility]   09/16/2023 10:20 AM Reather Converse, PA-C [facility]

## 2023-05-19 ENCOUNTER — Inpatient Hospital Stay: Admit: 2023-05-19 | Payer: MEDICARE | Primary: Internal Medicine

## 2023-05-19 NOTE — Progress Notes (Signed)
PHYSICAL / OCCUPATIONAL THERAPY - DAILY TREATMENT NOTE    Patient Name: Elaine [name]    Date: 05/19/2023    DOB: 1951/08/06  Insurance: Payor: HU[employer] MEDICARE / Plan: HU[employer]NA GOLD PLUS HMO / Product Type: *No Product type* /      Patient DOB verified Yes     Visit #   Current / Total 3 8   Time   In / Out 1215 pm  1255 pm    Pain   In / Out 0/10 0/10    Subjective Functional Status/Changes: Patient explains that her neither her left hip or knee has been hurting.      TREATMENT AREA =  Pain in left hip [M25.552]  Pain in left knee [M25.562]  Other low back pain [M54.59]     OBJECTIVE         Therapeutic Procedures:    Tx Min Billable or 1:1 Min (if diff from Tx Min) Procedure, Rationale, Specifics   10  97110 Therapeutic Exercise (timed):  increase ROM, strength, coordination, balance, and proprioception to improve patient's ability to progress to PLOF and address remaining functional goals. (see flow sheet as applicable)     Details if applicable:       22  97530 Therapeutic Activity (timed):  use of dynamic activities replicating functional movements to increase ROM, strength, coordination, balance, and proprioception in order to improve patient's ability to progress to PLOF and address remaining functional goals.  (see flow sheet as applicable)     Details if applicable:     8  97535 Self Care/Home Management (timed):  improve patient knowledge and understanding of pain reducing techniques, activity modification, and updated HEP   to improve patient's ability to progress to PLOF and address remaining functional goals.  (see flow sheet as applicable)      Details if applicable:            Details if applicable:            Details if applicable:     40  MC BC Totals Reminder: bill using total billable min of TIMED therapeutic procedures (example: do not include dry needle or estim unattended, both untimed codes, in totals to left)  8-22 min = 1 unit; 23-37 min = 2 units; 38-52 min = 3 units; 53-67 min = 4 units;  68-82 min = 5 units   Total Total     [x]   Patient Education billed concurrently with other procedures   [x]  Review HEP    []  Progressed/Changed HEP, detail:    []  Other detail:       Objective Information/Functional Measures/Assessment    Patient presents to today's session with no complaints of left hip pain. Patient performed exercises, as per flow sheet, to further assist with increasing left hip strength and ROM for functional tasks. Progressed reps of LAQ. Patient tolerated exercises well with no increased reports of left hip pain. Will continue to progress patient as tolerated and able. Updated and issued HEP. Patient verbalized understanding.     Patient will continue to benefit from skilled PT / OT services to modify and progress therapeutic interventions, analyze and address functional mobility deficits, analyze and address ROM deficits, analyze and address strength deficits, analyze and address soft tissue restrictions, analyze and cue for proper movement patterns, and instruct in home and community integration to address functional deficits and attain remaining goals.    Progress toward goals / Updated goals:  []   See Progress Note/Recertification  1.Decrease max pain to <3/10 to assist with completion of ADLs and IADLs without significant pain.   PN 4/10  Current: Patient reports 0/10 pain today. 05/19/23    2.Pt is able to negotiate stair with reciprocal gait pattern and 1 railing for return to PLOF.     PN Patient able to negotiate 6" stairs 4x with reciprocal pattern and 1-2 HR  with cues for correct sequence  Current: Patient able to negotiate 6'' stairs with reciprocal pattern and 1-2 HR. 05/11/23    3.pt will be able to maintain rhomberg balance with NBOS for 20 sec with no LOB to perform light household chores with ease.   PN  not assessed  Current: Initiated today. Patient was able to tolerate  NBOS for 20 sec with no LOB. 05/12/23     4. Improve FOTO Functional Status Score by 11 points in order  to show significant functional improvement.    PN 46  Current: Ongoing, will assess at next PN. 05/19/23    Next PN/ RC due 06/06/2023  Auth due (visit number/ date) 15 visits expire 07/05/2023    PLAN  - Continue Plan of Care  - Upgrade activities as tolerated    Larene Pickett, PTA    05/19/2023    10:12 AM    Future Appointments   Date Time Provider Department Center   05/19/2023 12:10 PM Larene Pickett, PTA [facility]   05/26/2023 12:10 PM Katherina Mires, PT [facility]   06/02/2023 12:10 PM Katherina Mires, PT [facility]   07/02/2023 11:40 AM Marvia Pickles, MD [facility]   08/16/2023  9:15 AM Heloise Beecham, DO [facility]   09/16/2023 10:20 AM Reather Converse, PA-C [facility] [place]

## 2023-05-22 ENCOUNTER — Encounter

## 2023-05-26 ENCOUNTER — Encounter: Payer: MEDICARE | Primary: Internal Medicine

## 2023-06-01 ENCOUNTER — Ambulatory Visit: Payer: MEDICARE | Primary: Internal Medicine

## 2023-06-02 ENCOUNTER — Encounter: Payer: MEDICARE | Primary: Internal Medicine

## 2023-06-08 ENCOUNTER — Ambulatory Visit: Payer: MEDICARE | Primary: Internal Medicine

## 2023-06-11 ENCOUNTER — Inpatient Hospital Stay: Admit: 2023-06-11 | Payer: MEDICARE | Primary: Internal Medicine

## 2023-06-11 ENCOUNTER — Encounter

## 2023-06-11 DIAGNOSIS — I10 Essential (primary) hypertension: Secondary | ICD-10-CM

## 2023-06-11 LAB — PTH, INTACT
Calcium: 9.9 MG/DL (ref 8.5–10.1)
Pth Intact: 30.5 pg/mL (ref 18.4–88.0)

## 2023-06-11 LAB — URINALYSIS WITH MICROSCOPIC
Bilirubin, Urine: NEGATIVE
Blood, Urine: NEGATIVE
Glucose, Ur: NEGATIVE mg/dL
Ketones, Urine: NEGATIVE mg/dL
Nitrite, Urine: NEGATIVE
Protein, UA: NEGATIVE mg/dL
Specific Gravity, UA: 1.01 (ref 1.005–1.030)
Urobilinogen, Urine: 1 EU/dL (ref 0.2–1.0)
WBC, UA: 3 /hpf (ref 0–4)
pH, Urine: 6 (ref 5.0–8.0)

## 2023-06-11 LAB — RENAL FUNCTION PANEL
Albumin: 3.7 g/dL (ref 3.4–5.0)
Anion Gap: 4 mmol/L (ref 3.0–18)
BUN/Creatinine Ratio: 13 (ref 12–20)
BUN: 6 MG/DL — ABNORMAL LOW (ref 7.0–18)
CO2: 33 mmol/L — ABNORMAL HIGH (ref 21–32)
Calcium: 9.8 MG/DL (ref 8.5–10.1)
Chloride: 106 mmol/L (ref 100–111)
Creatinine: 0.47 MG/DL — ABNORMAL LOW (ref 0.6–1.3)
Est, Glom Filt Rate: >90 mL/min/{1.73_m2} (ref >60)
Glucose: 91 mg/dL (ref 74–99)
Phosphorus: 3.5 MG/DL (ref 2.5–4.9)
Potassium: 4 mmol/L (ref 3.5–5.5)
Sodium: 143 mmol/L (ref 136–145)

## 2023-06-11 LAB — CBC WITH AUTO DIFFERENTIAL
Basophils %: 1 % (ref 0–2)
Basophils Absolute: 0 10*3/uL (ref 0.0–0.1)
Eosinophils %: 2 % (ref 0–5)
Eosinophils Absolute: 0.1 10*3/uL (ref 0.0–0.4)
Hematocrit: 32.2 % — ABNORMAL LOW (ref 35.0–45.0)
Hemoglobin: 10.3 g/dL — ABNORMAL LOW (ref 12.0–16.0)
Immature Granulocytes %: 0 % (ref 0.0–0.5)
Immature Granulocytes Absolute: 0 10*3/uL (ref 0.00–0.04)
Lymphocytes %: 20 % — ABNORMAL LOW (ref 21–52)
Lymphocytes Absolute: 0.9 10*3/uL (ref 0.9–3.6)
MCH: 25 PG (ref 24.0–34.0)
MCHC: 32 g/dL (ref 31.0–37.0)
MCV: 78.2 FL (ref 78.0–100.0)
MPV: 10.6 FL (ref 9.2–11.8)
Monocytes %: 9 % (ref 3–10)
Monocytes Absolute: 0.4 10*3/uL (ref 0.05–1.2)
Neutrophils %: 69 % (ref 40–73)
Neutrophils Absolute: 3.2 10*3/uL (ref 1.8–8.0)
Nucleated RBCs: 0 PER 100 WBC
Platelets: 310 10*3/uL (ref 135–420)
RBC: 4.12 M/uL — ABNORMAL LOW (ref 4.20–5.30)
RDW: 17.4 % — ABNORMAL HIGH (ref 11.6–14.5)
WBC: 4.6 10*3/uL (ref 4.6–13.2)
nRBC: 0 10*3/uL (ref 0.00–0.01)

## 2023-06-11 LAB — CREATININE, RANDOM URINE: Creatinine, Ur: 53 mg/dL (ref 30–125)

## 2023-06-11 LAB — TSH + FREE T4 PANEL
T4 Free: 1.7 NG/DL — ABNORMAL HIGH (ref 0.7–1.5)
TSH, 3rd Generation: 0.02 u[IU]/mL — ABNORMAL LOW (ref 0.36–3.74)

## 2023-06-11 LAB — ALBUMIN/CREATININE RATIO, URINE: Albumin Urine: 0.71 mg/dL (ref 0–3.0)

## 2023-06-11 LAB — MICROALBUMIN / CREATININE URINE RATIO
Creatinine, Ur: 51 mg/dL (ref 30–125)
Microalb/Creat Ratio: 14 mg/g (ref 0–30)

## 2023-06-11 LAB — PROTEIN, URINE, RANDOM: Protein, Urine, Random: 13 mg/dL — ABNORMAL HIGH (ref <11.9)

## 2023-06-11 LAB — URIC ACID: Uric Acid: 3.3 MG/DL (ref 2.6–7.2)

## 2023-06-14 ENCOUNTER — Inpatient Hospital Stay: Admit: 2023-06-14 | Payer: MEDICARE | Attending: Nephrology | Primary: Internal Medicine

## 2023-06-14 DIAGNOSIS — N261 Atrophy of kidney (terminal): Secondary | ICD-10-CM

## 2023-06-14 LAB — KAPPA/LAMBDA QUANTITATIVE FREE LIGHT CHAINS, SERUM
Free Kappa Light Chains: 19.6 mg/L — ABNORMAL HIGH (ref 3.3–19.4)
Free Lambda Light Chains: 14.9 mg/L (ref 5.7–26.3)
K/L Ratio: 1.32 (ref 0.26–1.65)

## 2023-06-14 MED ORDER — TECHNETIUM TC 99M SUCCIMER NEPHROSCAN
Freq: Once | INTRAVENOUS | Status: AC | PRN
Start: 2023-06-14 — End: 2023-06-14
  Administered 2023-06-14: 13:00:00 5.29 via INTRAVENOUS

## 2023-06-16 LAB — ELECTROPHORESIS PROTEIN, SERUM
Albumin/Globulin Ratio: 1.3 (ref 0.7–1.7)
Albumin: 3.6 g/dL (ref 2.9–4.4)
Alpha-1-Globulin: 0.3 g/dL (ref 0.0–0.4)
Alpha-2-Globulin: 0.6 g/dL (ref 0.4–1.0)
Beta Globulin: 0.9 g/dL (ref 0.7–1.3)
Gamma Globulin: 1.1 g/dL (ref 0.4–1.8)
Globulin: 2.8 g/dL (ref 2.2–3.9)
M-Spike: 0.4 g/dL — ABNORMAL HIGH
Total Protein: 6.4 g/dL (ref 6.0–8.5)

## 2023-06-16 LAB — ALDOSTERONE & RENIN, DIRECT WITH RATIO
ALDOSTERONE/RENIN RATIO: >33.5 — ABNORMAL HIGH (ref 0.0–30.0)
Aldosterone: 5.6 ng/dL (ref 0.0–30.0)
Renin Activity: <0.167 ng/mL/hr — ABNORMAL LOW (ref 0.167–5.380)

## 2023-06-17 ENCOUNTER — Inpatient Hospital Stay: Admit: 2023-06-18 | Payer: MEDICARE | Primary: Internal Medicine

## 2023-06-17 DIAGNOSIS — M25552 Pain in left hip: Secondary | ICD-10-CM

## 2023-06-17 NOTE — Progress Notes (Signed)
PHYSICAL / OCCUPATIONAL THERAPY - DAILY TREATMENT NOTE    Patient Name: Elaine [name]    Date: 06/17/2023    DOB: 1951/10/04  Insurance: Payor: HUMANA MEDICARE / Plan: HUMANA GOLD PLUS HMO / Product Type: *No Product type* /      Patient DOB verified Yes     Visit #   Current / Total 1 8   Time   In / Out 5:20 pm  6:05 pm    Pain   In / Out 0/10 0/10    Subjective Functional Status/Changes: Patient reports that her L knee is bothering her the most recently.      TREATMENT AREA =  Pain in left hip [M25.552]  Pain in left knee [M25.562]  Other low back pain [M54.59]     OBJECTIVE      Therapeutic Procedures:    Tx Min Billable or 1:1 Min (if diff from Tx Min) Procedure, Rationale, Specifics   15 15 97110 Therapeutic Exercise (timed):  increase ROM, strength, coordination, balance, and proprioception to improve patient's ability to progress to PLOF and address remaining functional goals. (see flow sheet as applicable)     Details if applicable:       22 22 97530 Therapeutic Activity (timed):  use of dynamic activities replicating functional movements to increase ROM, strength, coordination, balance, and proprioception in order to improve patient's ability to progress to PLOF and address remaining functional goals.  (see flow sheet as applicable)     Details if applicable:     8 8 97112 Neuromuscular Re-Education (timed):  improve balance, coordination, kinesthetic sense, posture, core stability and proprioception to improve patient's ability to develop conscious control of individual muscles and awareness of position of extremities in order to progress to PLOF and address remaining functional goals. (see flow sheet as applicable)      Details if applicable:            Details if applicable:            Details if applicable:     45 45 MC BC Totals Reminder: bill using total billable min of TIMED therapeutic procedures (example: do not include dry needle or estim unattended, both untimed codes, in totals to left)  8-22  min = 1 unit; 23-37 min = 2 units; 38-52 min = 3 units; 53-67 min = 4 units; 68-82 min = 5 units   Total Total     [x]   Patient Education billed concurrently with other procedures   [x]  Review HEP    []  Progressed/Changed HEP, detail:    []  Other detail:       Objective Information/Functional Measures/Assessment    Ms. Goertzen has been seen for 18 treatments and has shown fair progress with PT. Pt reports 60% improvement.  Pain level on average is 4/10 which is intermittent or can continue all day. Pt mainly c/o pain at left knee pain. [name] functional gains are walking and standing without cane for short period, pain has decreased at hip, can perform light household chores, pt is driving. [name] functional deficits are standing and walking for long period,lifting medium to heavy weights,ascend/descend stairs reciprocal pattern with  1HR, and pain at left knee. Recommend continuation of PT for pain reduction, further strengthening,improve pt's gait and functional mobility.     Key Functional Changes/Progress: walking and stand without can for short period,pain decreased pain at hip,perform light household chores, driving   Problem List: pain affecting function, decrease ROM, decrease strength, impaired  gait/balance, decrease ADL/functional abilities, decrease activity tolerance, decrease flexibility/joint mobility, and decrease transfer abilities               Treatment Plan may include any combination of the following: 16109 Therapeutic Exercise, 97112 Neuromuscular Re-Education, 97140 Manual Therapy, 97530 Therapeutic Activity, 97535 Self Care/Home Management, and 97116 Gait Training  Patient Goal(s) has been updated and includes: Pt would like to decrease her L knee pain to avoid TKA and to decrease her falls risk.     Patient will continue to benefit from skilled PT / OT services to modify and progress therapeutic interventions, analyze and address functional mobility deficits, analyze and address ROM deficits,  analyze and address strength deficits, analyze and address soft tissue restrictions, analyze and cue for proper movement patterns, and instruct in home and community integration to address functional deficits and attain remaining goals.    Progress toward goals / Updated goals:  []   See Progress Note/Recertification    Goals for this certification period include and are to be achieved in   4  WEEKS    Decrease max pain to <3/10 to assist with completion of ADLs and IADLs without significant pain.   Current: Pt reports 5-6/10 with vaccumming and when she is trying to make the bed       2. Pt is able to negotiate stair with reciprocal gait pattern and 1 railing for return to PLOF.   Current: Patient able to negotiate 6'' stairs with reciprocal pattern and 2 HR with significant knee and low back pain.      3.pt will be able to maintain tandem stance balance for 20 sec with no LOB to decrease risk of falls with everyday activities.   Current:  tandem stance 3 secs       4. Improve FOTO Functional Status Score to 54 points in order to show significant functional improvement.   Current: 49 points     Frequency / Duration:  Patient to be seen   2   times per week for   4    WEEKS    Next RC due 07/18/2023  Auth due (visit number/ date) 15 visits expires 07/05/2023    PLAN  - Continue Plan of Care  - Upgrade activities as tolerated    Katherina Mires, PT    06/17/2023    5:59 PM    Future Appointments   Date Time Provider Department Center   06/21/2023  5:30 PM Katheran James, PT [facility]   07/02/2023 11:40 AM Marvia Pickles, MD [facility]   08/16/2023  9:15 AM Heloise Beecham, DO [facility]   09/16/2023 10:20 AM Reather Converse, PA-C [facility] [place]

## 2023-06-17 NOTE — Telephone Encounter (Signed)
Asked patient to call back to discuss today's appt since she hasn't been here for a month. She has a 5:30 appt.

## 2023-06-17 NOTE — Other (Signed)
[facility] PHYSICAL THERAPY  [address] [place] [address]  [phone]  CONTINUED PLAN OF CARE/RECERTIFICATION FOR PHYSICAL THERAPY          Patient Name: Elaine [name] DOB: 1951-05-05   Treatment/Medical Diagnosis: Pain in left hip [M25.552]  Pain in left knee [M25.562]  Other low back pain [M54.59]   Onset Date: 01/08/23    Referral Source: Warren Danes Start of Care [facility]): 03/03/23   Prior Hospitalization: See Medical History Provider #: [phone]   Prior Level of Function: Independent - ADL and IADLs    Comorbidities: HTN, arthritis, chronic back pain, chiari malformation    Visits from [facility]:   18 Missed Visits:  3     Progress to Goals:    1.Decrease max pain to <3/10 to assist with completion of ADLs and IADLs without significant pain.   PN 4/10  Current: Pt reports 5-6/10 with vaccumming and when she is trying to make the bed  06/17/23; improving     2.Pt is able to negotiate stair with reciprocal gait pattern and 1 railing for return to PLOF.   PN Patient able to negotiate 6" stairs 4x with reciprocal pattern and 1-2 HR  with cues for correct sequence  Current: Patient able to negotiate 6'' stairs with reciprocal pattern and 2 HR with significant knee and low back pain. 06/17/23; decline     3.pt will be able to maintain rhomberg balance with NBOS for 20 sec with no LOB to perform light household chores with ease.   PN  not assessed  Current:  romberg for 30 sec with no LOB. 06/17/23; goal met      4. Improve FOTO Functional Status Score by 11 points to 54 points in order to show significant functional improvement.    PN 46 points  Current: 49 points 06/17/23; improving      Key Functional Changes/Progress: walking and stand without can for short period,pain decreased pain at hip,perform light household chores, driving   Problem List: pain affecting function, decrease ROM, decrease strength, impaired gait/balance, decrease  ADL/functional abilities, decrease activity tolerance, decrease flexibility/joint mobility, and decrease transfer abilities    Treatment Plan may include any combination of the following: 51884 Therapeutic Exercise, 97112 Neuromuscular Re-Education, 97140 Manual Therapy, 97530 Therapeutic Activity, 97535 Self Care/Home Management, and 97116 Gait Training  Patient Goal(s) has been updated and includes: Pt would like to decrease her L knee pain to avoid TKA and to decrease her falls risk.     Goals for this certification period include and are to be achieved in   4  WEEKS    Decrease max pain to <3/10 to assist with completion of ADLs and IADLs without significant pain.   Current: Pt reports 5-6/10 with vaccumming and when she is trying to make the bed       2. Pt is able to negotiate stair with reciprocal gait pattern and 1 railing for return to PLOF.   Current: Patient able to negotiate 6'' stairs with reciprocal pattern and 2 HR with significant knee and low back pain.      3.pt will be able to maintain tandem stance balance for 20 sec with no LOB to decrease risk of falls with everyday activities.   Current:  tandem stance 3 secs       4. Improve FOTO Functional Status Score to 54 points in order to show significant functional improvement.   Current: 49  points     Frequency / Duration:   Patient to be seen   2   times per week for   4    WEEKS    Assessments/Recommendations: Ms. Mcphillips has been seen for 18 treatments and has shown fair progress with PT. Pt reports 60% improvement.  Pain level on average is 4/10 which is intermittent or can continue all day. Pt mainly c/o pain at left knee pain. [name] functional gains are walking and standing without cane for short period, pain has decreased at hip, can perform light household chores, pt is driving. [name] functional deficits are standing and walking for long period,lifting medium to heavy weights,ascend/descend stairs reciprocal pattern with  1HR, and pain at left  knee. Recommend continuation of PT for pain reduction, further strengthening,improve pt's gait and functional mobility.     If you have any questions/comments please contact us directly at [phone].   Thank you for allowing [place] to assist in the care of your patient.    Certification Period: 06/17/23 - 07/05/23  Reporting Period (date from last assessment to current assessment): 05/06/23 - 06/17/23    Katherina Mires, PT       06/17/2023       1:39 PM      ___ I have read the above report and request that my patient continue as recommended.   ___ I have read the above report and request that my patient continue therapy with the following changes/special instructions: ________________________________________________   ___ I have read the above report and request that my patient be discharged from therapy.     Physician's Signature:_________________________   DATE:_________   TIME:________                           Clerance Lav, PA-C    ** Signature, Date and Time must be completed for valid certification **  Please sign and fax to [facility] [phone].  Thank you

## 2023-06-21 ENCOUNTER — Inpatient Hospital Stay: Payer: MEDICARE | Primary: Internal Medicine

## 2023-06-23 ENCOUNTER — Inpatient Hospital Stay: Admit: 2023-06-23 | Payer: MEDICARE | Primary: Internal Medicine

## 2023-06-23 NOTE — Progress Notes (Signed)
PHYSICAL / OCCUPATIONAL THERAPY - DAILY TREATMENT NOTE    Patient Name: Elaine [name]    Date: 06/23/2023    DOB: Mar 22, 1951  Insurance: Payor: HUMANA MEDICARE / Plan: HUMANA GOLD PLUS HMO / Product Type: *No Product type* /      Patient DOB verified Yes     Visit #   Current / Total 2 8   Time   In / Out 4:17 4:55   Pain   In / Out LB  6  hip 0 LB  2-3  hip  0   Subjective Functional Status/Changes: "No pain at my hip just my back."     TREATMENT AREA =  Pain in left hip [M25.552]  Pain in left knee [M25.562]  Other low back pain [M54.59]     OBJECTIVE         Therapeutic Procedures:    Tx Min Billable or 1:1 Min (if diff from Tx Min) Procedure, Rationale, Specifics   30  97110 Therapeutic Exercise (timed):  increase ROM, strength, coordination, balance, and proprioception to improve patient's ability to progress to PLOF and address remaining functional goals. (see flow sheet as applicable)     Details if applicable:       8  97530 Therapeutic Activity (timed):  use of dynamic activities replicating functional movements to increase ROM, strength, coordination, balance, and proprioception in order to improve patient's ability to progress to PLOF and address remaining functional goals.  (see flow sheet as applicable)     Details if applicable:            Details if applicable:            Details if applicable:            Details if applicable:     38  MC BC Totals Reminder: bill using total billable min of TIMED therapeutic procedures (example: do not include dry needle or estim unattended, both untimed codes, in totals to left)  8-22 min = 1 unit; 23-37 min = 2 units; 38-52 min = 3 units; 53-67 min = 4 units; 68-82 min = 5 units   Total Total     [x]   Patient Education billed concurrently with other procedures   [x]  Review HEP    []  Progressed/Changed HEP, detail:    []  Other detail:       Objective Information/Functional Measures/Assessment  Pt responded well to each strengthening and flexibility there ex with  cues for correct form. Pt ambulated with slight trunk flexion pt was able to perform with correct posture wit cues. Overall pt is progressing towards all goals.      Patient will continue to benefit from skilled PT / OT services to modify and progress therapeutic interventions, analyze and address functional mobility deficits, analyze and address ROM deficits, analyze and address strength deficits, analyze and address soft tissue restrictions, analyze and cue for proper movement patterns, analyze and modify for postural abnormalities, analyze and address imbalance/dizziness, and instruct in home and community integration to address functional deficits and attain remaining goals.    Progress toward goals / Updated goals:  []   See Progress Note/Recertification  Decrease max pain to <3/10 to assist with completion of ADLs and IADLs without significant pain.   Current: Pt reports 5-6/10 with vaccumming and when she is trying to make the bed    06/23/23     2. Pt is able to negotiate stair with reciprocal gait pattern and 1 railing for return  to PLOF.   Current: Patient able to negotiate 6'' stairs with reciprocal pattern and 2 HR with significant knee and low back pain.      3.pt will be able to maintain tandem stance balance for 20 sec with no LOB to decrease risk of falls with everyday activities.   Current:  tandem stance 3 secs       4. Improve FOTO Functional Status Score to 54 points in order to show significant functional improvement.   Current: 49 points       Next PN/ RC due  07/18/23  Auth due (visit number/ date) 15 visits ex  07/05/23    PLAN  - Continue Plan of Care    Merrie Epler  [place], PTA    06/23/2023    4:29 PM    Future Appointments   Date Time Provider Department Center   06/25/2023  4:10 PM Larene Pickett, PTA [facility]   06/30/2023  4:50 PM Larene Pickett, PTA [facility]   07/02/2023  9:40 AM Marvia Pickles, MD [facility]   07/05/2023  4:10 PM Katheran James, PT [facility]   08/16/2023  9:15 AM  Lorin Picket, Earle Gell, DO [facility]   09/16/2023 10:20 AM Reather Converse, PA-C [facility] [place]

## 2023-06-25 ENCOUNTER — Inpatient Hospital Stay: Admit: 2023-06-25 | Payer: MEDICARE | Primary: Internal Medicine

## 2023-06-25 NOTE — Progress Notes (Signed)
PHYSICAL / OCCUPATIONAL THERAPY - DAILY TREATMENT NOTE    Patient Name: Elaine [name]    Date: 06/25/2023    DOB: 1951/12/02  Insurance: Payor: HU[employer]NA MEDICARE / Plan: HU[employer]NA GOLD PLUS HMO / Product Type: *No Product type* /      Patient DOB verified Yes     Visit #   Current / Total 3 8   Time   In / Out 435 pm  503 pm    Pain   In / Out 0/10  0/10    Subjective Functional Status/Changes: Patient reports no left hip pain but reports having left knee pain when using the bathroom earlier.      TREATMENT AREA =  Pain in left hip [M25.552]  Pain in left knee [M25.562]  Other low back pain [M54.59]     OBJECTIVE         Therapeutic Procedures:    Tx Min Billable or 1:1 Min (if diff from Tx Min) Procedure, Rationale, Specifics   15  97110 Therapeutic Exercise (timed):  increase ROM, strength, coordination, balance, and proprioception to improve patient's ability to progress to PLOF and address remaining functional goals. (see flow sheet as applicable)     Details if applicable:       13  97530 Therapeutic Activity (timed):  use of dynamic activities replicating functional movements to increase ROM, strength, coordination, balance, and proprioception in order to improve patient's ability to progress to PLOF and address remaining functional goals.  (see flow sheet as applicable)     Details if applicable:            Details if applicable:            Details if applicable:            Details if applicable:     28  MC BC Totals Reminder: bill using total billable min of TIMED therapeutic procedures (example: do not include dry needle or estim unattended, both untimed codes, in totals to left)  8-22 min = 1 unit; 23-37 min = 2 units; 38-52 min = 3 units; 53-67 min = 4 units; 68-82 min = 5 units   Total Total     [x]   Patient Education billed concurrently with other procedures   [x]  Review HEP    []  Progressed/Changed HEP, detail:    []  Other detail:       Objective Information/Functional Measures/Assessment    Patient was  late to today's session. Patient performed exercises, as per flow sheet, to further assist with increasing left hip strength and stability for ambulating safely with no AD. No increase in pain was reported at the end of today's session.Will continue to progress patient as tolerated and able.     Patient will continue to benefit from skilled PT / OT services to modify and progress therapeutic interventions, analyze and address functional mobility deficits, analyze and address strength deficits, analyze and address soft tissue restrictions, analyze and cue for proper movement patterns, analyze and modify for postural abnormalities, analyze and address imbalance/dizziness, and instruct in home and community integration to address functional deficits and attain remaining goals.    Progress toward goals / Updated goals:  []   See Progress Note/Recertification    Decrease max pain to <3/10 to assist with completion of ADLs and IADLs without significant pain.   PN: Pt reports 5-6/10 with vaccumming and when she is trying to make the bed    06/23/23  Current:Patient reports 0/10 pain today.  06/25/23     2. Pt is able to negotiate stair with reciprocal gait pattern and 1 railing for return to PLOF.   PN: Patient able to negotiate 6'' stairs with reciprocal pattern and 2 HR with significant knee and low back pain.      3.pt will be able to maintain tandem stance balance for 20 sec with no LOB to decrease risk of falls with everyday activities.   PN:  tandem stance 3 secs       4. Improve FOTO Functional Status Score to 54 points in order to show significant functional improvement.   PN: 49 points   Current: Ongoing, will assess at next PN. 06/25/23    Next PN/ RC due 07/18/2023  Auth due (visit number/ date) 15 visits expire 07/05/23    PLAN  - Continue Plan of Care  - Upgrade activities as tolerated    Larene Pickett, PTA    06/25/2023    5:01 PM  If an interpreting service was utilized for treatment of this patient, the contents  of this document represent the material reviewed with the patient via the interpreter.     Future Appointments   Date Time Provider Department Center   06/30/2023  4:50 PM Larene Pickett, PTA [facility]   07/02/2023  9:40 AM Marvia Pickles, MD [facility]   07/05/2023  4:10 PM Katheran James, PT [facility]   08/16/2023  9:15 AM Lorin Picket, Earle Gell, DO [facility] AM[facility]   09/16/2023 10:20 AM Reather Converse, PA-C [facility]

## 2023-06-30 ENCOUNTER — Inpatient Hospital Stay: Admit: 2023-07-01 | Payer: MEDICARE | Primary: Internal Medicine

## 2023-06-30 NOTE — Progress Notes (Signed)
PHYSICAL / OCCUPATIONAL THERAPY - DAILY TREATMENT NOTE    Patient Name: Elaine [name]    Date: 06/30/2023    DOB: [birth year]  Insurance: Payor: HUMANA MEDICARE / Plan: HU[employer]NA GOLD PLUS HMO / Product Type: *No Product type* /      Patient DOB verified Yes     Visit #   Current / Total 4 8   Time   In / Out 450 pm 530 pm    Pain   In / Out 0/10  0/10    Subjective Functional Status/Changes: Patient reports that her right knee is bothering her today but otherwise she is doing good.      TREATMENT AREA =  Pain in left hip [M25.552]  Pain in left knee [M25.562]  Other low back pain [M54.59]     OBJECTIVE         Therapeutic Procedures:    Tx Min Billable or 1:1 Min (if diff from Tx Min) Procedure, Rationale, Specifics   25 20 97110 Therapeutic Exercise (timed):  increase ROM, strength, coordination, balance, and proprioception to improve patient's ability to progress to PLOF and address remaining functional goals. (see flow sheet as applicable)     Details if applicable:       15 10 97530 Therapeutic Activity (timed):  use of dynamic activities replicating functional movements to increase ROM, strength, coordination, balance, and proprioception in order to improve patient's ability to progress to PLOF and address remaining functional goals.  (see flow sheet as applicable)     Details if applicable:            Details if applicable:            Details if applicable:            Details if applicable:     40 30 MC BC Totals Reminder: bill using total billable min of TIMED therapeutic procedures (example: do not include dry needle or estim unattended, both untimed codes, in totals to left)  8-22 min = 1 unit; 23-37 min = 2 units; 38-52 min = 3 units; 53-67 min = 4 units; 68-82 min = 5 units   Total Total     [x]   Patient Education billed concurrently with other procedures   [x]  Review HEP    []  Progressed/Changed HEP, detail:    []  Other detail:       Objective Information/Functional Measures/Assessment    Patient is  progressing well with PT and nearing discharge. Patient performed exercises, as per flow sheet, to further assist with increasing left hip strength and stability for functional tasks. Increased left knee pain was reported with mini squats and knee flexion stretch. Patient tolerated all other exercises well. Discussed POC and amount of authorized appointments the patient has available. Patient to be discharged next session, due insurance authorization expiring, next session. Patient to be discharged next session.     Patient will continue to benefit from skilled PT / OT services to modify and progress therapeutic interventions, analyze and address functional mobility deficits, analyze and address strength deficits, analyze and address soft tissue restrictions, analyze and cue for proper movement patterns, analyze and modify for postural abnormalities, analyze and address imbalance/dizziness, and instruct in home and community integration to address functional deficits and attain remaining goals.    Progress toward goals / Updated goals:  []   See Progress Note/Recertification    Decrease max pain to <3/10 to assist with completion of ADLs and IADLs without significant pain.  PN: Pt reports 5-6/10 with vaccumming and when she is trying to make the bed    06/23/23  Current:Patient reports 0/10 pain today. 06/30/23     2. Pt is able to negotiate stair with reciprocal gait pattern and 1 railing for return to PLOF.   PN: Patient able to negotiate 6'' stairs with reciprocal pattern and 2 HR with significant knee and low back pain.      3.pt will be able to maintain tandem stance balance for 20 sec with no LOB to decrease risk of falls with everyday activities.   PN:  tandem stance 3 secs   Current- Patient is able to perform semi tandem stance with no HHA. 06/30/23      4. Improve FOTO Functional Status Score to 54 points in order to show significant functional improvement.   PN: 49 points   Current: Ongoing, will assess at  next PN. 06/30/23    Next PN/ RC due 07/18/2023  Auth due (visit number/ date) 15 visits expire 07/05/23    PLAN  - Continue Plan of Care  - Upgrade activities as tolerated  - Other : Discharge, next session     Larene Pickett, PTA    06/30/2023    5:45 PM  If an interpreting service was utilized for treatment of this patient, the contents of this document represent the material reviewed with the patient via the interpreter.     Future Appointments   Date Time Provider Department Center   07/02/2023  9:40 AM Marvia Pickles, MD [facility] AM[facility]   07/05/2023  4:10 PM Katheran James, PT [facility]   08/16/2023  9:15 AM Lorin Picket, Earle Gell, DO [facility] AM[facility]   09/16/2023 10:20 AM Reather Converse, PA-C [facility]

## 2023-07-02 ENCOUNTER — Encounter: Payer: MEDICARE | Attending: Internal Medicine | Primary: Internal Medicine

## 2023-07-02 ENCOUNTER — Ambulatory Visit: Admit: 2023-07-02 | Discharge: 2023-07-02 | Payer: MEDICARE | Attending: Internal Medicine | Primary: Internal Medicine

## 2023-07-02 DIAGNOSIS — I10 Essential (primary) hypertension: Secondary | ICD-10-CM

## 2023-07-02 MED ORDER — VALSARTAN 160 MG PO TABS
160 MG | ORAL_TABLET | Freq: Every day | ORAL | 1 refills | Status: DC
Start: 2023-07-02 — End: 2024-03-07

## 2023-07-02 MED ORDER — LEVOTHYROXINE SODIUM 88 MCG PO TABS
88 MCG | ORAL_TABLET | Freq: Every day | ORAL | 3 refills | Status: DC
Start: 2023-07-02 — End: 2024-05-03

## 2023-07-02 NOTE — Progress Notes (Signed)
Elaine [name] is a 72 y.o.[name]  female and presents with Hypertension, Cholesterol Problem, Thyroid Problem (/), and Vitamin D Deficiency      SUBJECTIVE:  [name] HTN is uncontrolled on   Norvasc 5  mg.  [name] high cholesterol has been borderline controlled on Lipitor 40 mg. Pt  denies any side effects like myalgias. Compliance with medications has been an issue.  Pt has been controlling her prediabetes with diet and weight loss.  Patient daughter has been helping her to be more compliant with taking her medications.  They will have updated blood work done before next OV.     Thyroid Review:  Patient is seen for followup of hypothyroidism.   Thyroid ROS: denies fatigue, weight changes, heat/cold intolerance, bowel/skin changes or CVS symptoms. 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  Patient's TSH level is too low on Synthroid 100 mcg daily so we will decrease it to 88 mcg daily    Patient has history of vitamin D deficiency and should be on vitamin D about 2000 units/day.  Will get updated blood work  Patient has sleep apnea and is using a CPAP machine and followed by sleep specialist    She continues to have fungal infection in her toenails which is slowly improving with Lamisil.     Patient has asthma that is controlled on Singulair and  trelegy Ellipta.  Patient is followed by pulmonary who also sees her for sleep apnea.      Patient has chronic diastolic heart failure which is stable and followed by cardiology    Patient has an atrophic right kidney which may have been going on for several years if not since birth.  Duplex renal ultrasound shows Eleveated velocity identified in the mid left renal artery in the presence of tortousity. No hemodynamically significant stenosis involving the right renal artery.  Patient is followed by nephrology.  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  She has had EGD/colonoscopy and pill endoscopy in the past.  Patient unable to use aspirin due to this reason.    Patient has a history of Chiari malformation type I and has been referred to neurology.    Respiratory ROS: negative for - shortness of breath  Cardiovascular ROS: negative for - chest pain      Current Outpatient Medications   Medication Sig    levothyroxine (SYNTHROID) 88 MCG tablet Take 1 tablet by mouth daily    valsartan (DIOVAN) 160 MG tablet Take 1 tablet by mouth daily    montelukast (SINGULAIR) 10 MG tablet TAKE 1 TABLET EVERY DAY    vitamin C (ASCORBIC ACID) 500 MG tablet Take 1 tablet by mouth daily    Multiple Vitamins-Minerals (MULTIVITAMIN ADULTS 50+ PO) Take 1 tablet by mouth daily    amLODIPine (NORVASC) 5 MG tablet Take 1 tablet by mouth daily    Misc Natural Products (NEURIVA PO) Take 1 tablet by mouth in the morning and at bedtime    NONFORMULARY Take 1 tablet by mouth every evening Viviscal -1 tablet every evening for hair and scalp    potassium chloride (KLOR-CON M) 10 MEQ extended release tablet TAKE 1 TABLET EVERY DAY    atorvastatin (LIPITOR) 40 MG tablet TAKE 1 TABLET EVERY  DAY (NEED MD APPOINTMENT)    albuterol sulfate HFA (PROVENTIL;VENTOLIN;PROAIR) 108 (90 Base) MCG/ACT inhaler Inhale 2 puffs into the lungs every 4 hours as needed for Wheezing or Shortness of Breath    ZINC PO Take 1 tablet by mouth daily    iron dextran complex (INFED) 50 MG/ML injection Infuse 19.5 mLs intravenously Pt gets these every other month    latanoprost (XALATAN) 0.005 % ophthalmic solution Place 1 drop into both eyes nightly    lidocaine (LIDODERM) 5 % Place 1 patch onto the skin every 24 hours 12 hours on and 12 hours off    diclofenac sodium (VOLTAREN) 1 % GEL Apply 4 g topically 4 times daily (Patient not taking: Reported on 07/02/2023)    vitamin D 25 MCG (1000 UT) CAPS  Take 1 capsule by mouth daily (Patient not taking: Reported on 07/02/2023)    acetaminophen (TYLENOL) 500 MG tablet Take 2 tablets by mouth in the morning and 2 tablets at noon and 2 tablets in the evening.    gabapentin (NEURONTIN) 300 MG capsule Take 1 capsule by mouth 3 times daily for 180 days. Max Daily Amount: 900 mg (Patient taking differently: Take 1 capsule by mouth 3 times daily. Taking one in the morning and two in the evening)    cyanocobalamin 1000 MCG tablet Take 1 tablet by mouth every evening (Patient not taking: Reported on 07/02/2023)     No current facility-administered medications for this visit.           OBJECTIVE:  alert, well appearing, and in no distress    Visit Vitals  BP (!) 162/69 (Site: Left Upper Arm, Position: Sitting, Cuff Size: Small Adult)   Pulse 77   Temp 98 F (36.7 C) (Oral)   Resp 18   Ht 1.575 m (5\' 2" )   Wt 64.9 kg (143 lb)   SpO2 98%   BMI 26.16 kg/m          well developed and well nourished  Chest - clear to auscultation, no wheezes, rales or rhonchi, symmetric air entry  Heart - normal rate, regular rhythm, normal S1, S2, no murmurs, rubs, clicks or gallops  Extremities - peripheral pulses normal, no pedal edema, no clubbing or cyanosis    CMP:    Lab Results   Component Value Date/Time    NA 143 06/11/2023 01:29 PM    K 4.0 06/11/2023 01:29 PM    CL 106 06/11/2023 01:29 PM    CO2 33 06/11/2023 01:29 PM    BUN 6 06/11/2023 01:29 PM    CREATININE 0.47 06/11/2023 01:29 PM    GFRAA >60 05/26/2021 08:05 AM    AGRATIO 1.7 03/09/2023 12:00 AM    LABGLOM >90 06/11/2023 01:29 PM    LABGLOM 96 03/09/2023 12:00 AM    GLUCOSE 91 06/11/2023 01:29 PM    GLUCOSE 92 03/09/2023 12:00 AM    LABALBU 4.1 03/09/2023 12:00 AM    CALCIUM 9.9 06/11/2023 01:29 PM    CALCIUM 9.8 06/11/2023 01:29 PM    BILITOT 0.5 03/09/2023 12:00 AM    ALKPHOS 104 03/09/2023 12:00 AM    AST 16 03/09/2023 12:00 AM    ALT 19 03/09/2023 12:00 AM     HgBA1c:    Lab Results   Component Value Date/Time    LABA1C 5.3  03/09/2023 12:00 AM     FLP:    Lab Results   Component Value Date/Time    TRIG 66 03/09/2023 12:00 AM  HDL 61 03/09/2023 12:00 AM     TSH:    Lab Results   Component Value Date/Time    TSH 0.02 06/11/2023 01:29 PM         Discussed the patient's BMI with her.  The BMI follow up plan is as follows: I have counseled this patient on diet and exercise regimens.        Assessment/Plan    ICD-10-CM    1. Essential hypertension  I10 Uncontrolled on Norvasc 5 mg daily will add valsartan (DIOVAN) 160 MG tablet     Comprehensive Metabolic Panel      2. High cholesterol  E78.00 Control uncertain on Lipitor 40 mg daily patient to have updated lipid Panel      3. Postprocedural hypothyroidism  E89.0 Patient's levels too low on Synthroid 100 mcg which we will discontinue and change to levothyroxine (SYNTHROID) 88 MCG tablet     TSH + Free T4 Panel      4. Vitamin D deficiency  E55.9 Status unknown patient should be on vitamin D 2000 units/day and will check updated Vitamin D 25 Hydroxy      5. Prediabetes  R73.03 Controlled currently with diet and weight loss Hemoglobin A1C      6. Iron deficiency anemia due to chronic blood loss  D50.0 Patient managed by hematology where she has had iron infusions.  She has had negative GI workup in the past except for the AVMs CBC with Auto Differential           Follow-up and Dispositions    Ms.Return in about 3 months (around 10/02/2023) for labs 1 week before.             Reviewed plan of care. Patient has provided input and agrees with goals.

## 2023-07-02 NOTE — Progress Notes (Signed)
Elaine [name] presents today for   Chief Complaint   Patient presents with    Hypertension    Diabetes     4 month follow up      Patient states she has some medications she would like to have reviewed to see if she needs to take these medications.       "Have you been to the ER, urgent care clinic since your last visit?  Hospitalized since your last visit?"    NO    "Have you seen or consulted any other health care providers outside of [facility] since your last visit?"    Yes, Dr. [facility].

## 2023-07-05 ENCOUNTER — Inpatient Hospital Stay: Admit: 2023-07-05 | Payer: MEDICARE | Primary: Internal Medicine

## 2023-07-05 NOTE — Discharge Instructions (Signed)
Physical Therapy Discharge Instructions      [facility]  [address]  [place], [place] [address]  [phone] fax    Patient: Elaine [name]  DOB: 19-May-1951      Continue Home Exercise Program 1-2 times per day for 2-3 weeks, then decrease to 3-5 times per week      Continue with    []  Ice  as needed PRN times per day     [x]  Heat           Follow up with MD:     [x]  Upon completion of therapy     []  As needed      Recommendations:     [x]    Return to activity with home program    []    Return to activity with the following modifications:       [] Post Rehab Program    [] Join Independent aquatic program     [] Return to/join local gym      Additional Comments:        Katheran James, PT 07/05/2023 4:49 PM

## 2023-07-05 NOTE — Discharge Instructions (Signed)
PT DISCHARGE DAILY NOTE AND SUMMARY 01-23    Date:07/05/2023  Patient name: Elaine [name] Start of Care: 03/03/23   Referral source: Clerance Lav, PA-C DOB: Aug 05, 1951   Medical/Treatment Diagnosis: Pain in left hip [M25.552]  Pain in left knee [M25.562]  Other low back pain [M54.59] Onset Date:01/08/23     Prior Hospitalization: see medical history Provider#: [id number]   Comorbidities: HTN, arthritis, chronic back pain, chiari malformation   Prior Level of Function:Independent - ADL and IADLs   Insurance: Payor: HUMANA MEDICARE / Plan: HUMANA GOLD PLUS HMO / Product Type: *No Product type* /      Visits from Start of Care: 22 Missed Visits: 3    Medicare: Reporting Period (date from last assessment to current assessment): 06/17/23-07/05/23    Patient DOB verified yes     Visit #   Current / Total 5 8   Time   In / Out 415 454   Pain   In / Out 0 0   Subjective Functional Status/Changes:  I am not having any hip pain, but I am having some knee pain.      TREATMENT AREA =  Pain in left hip [M25.552]  Pain in left knee [M25.562]  Other low back pain [M54.59]    If an interpreting service is utilized for treatment of this patient, the contents of this document represent the material reviewed with the patient via the interpreter.     OBJECTIVE         Therapeutic Procedures:    Tx Min Billable or 1:1 Min (if diff from Tx Min) Procedure, Rationale, Specifics   15 15 97110 Therapeutic Exercise (timed):  increase ROM, strength, coordination, balance, and proprioception to improve patient's ability to progress to PLOF and address remaining functional goals. (see flow sheet as applicable)     Details if applicable:       15 15 97530 Therapeutic Activity (timed):  use of dynamic activities replicating functional movements to increase ROM, strength, coordination, balance, and proprioception in order to improve patient's ability to progress to PLOF and address remaining functional goals.  (see flow sheet as applicable)     Details  if applicable:     9 9 97112 Neuromuscular Re-Education (timed):  improve balance, coordination, kinesthetic sense, posture, core stability and proprioception to improve patient's ability to develop conscious control of individual muscles and awareness of position of extremities in order to progress to PLOF and address remaining functional goals. (see flow sheet as applicable)     Details if applicable:            Details if applicable:            Details if applicable:     39 39 MC BC Totals Reminder: bill using total billable min of TIMED therapeutic procedures (example: do not include dry needle or estim unattended, both untimed codes, in totals to left)  8-22 min = 1 unit; 23-37 min = 2 units; 38-52 min = 3 units; 53-67 min = 4 units; 68-82 min = 5 units   Total Total       [x]   Patient Education billed concurrently with other procedures   [x]  Review HEP    []  Progressed/Changed HEP, detail:    []  Other detail:       Objective Information/Functional Measures/Assessment    VC exercises and tech  FOTO 50  DC instructions        GOALS  Decrease max pain to <3/10 to  assist with completion of ADLs and IADLs without significant pain.   PN: Pt reports 5-6/10 with vaccumming and when she is trying to make the bed    06/23/23  Current:Patient reports 0/10 pain today. 07/05/23     2. Pt is able to negotiate stair with reciprocal gait pattern and 1 railing for return to PLOF.   PN: Patient able to negotiate 6'' stairs with reciprocal pattern and 2 HR with significant knee and low back pain.   CURRENT 6" stairs reciprocal 1-2 rails 07/05/23     3.pt will be able to maintain tandem stance balance for 20 sec with no LOB to decrease risk of falls with everyday activities.   PN:  tandem stance 3 secs   Current- Patient is able to perform semi tandem stance with no HHA.  07/05/23      4. Improve FOTO Functional Status Score to 54 points in order to show significant functional improvement.   PN: 49 points   Current: 50 07/05/23        RECOMMENDATIONS  Other: Berkley Harvey expired    Katheran James, PT       07/05/2023       4:31 PM  If an interpreting service was utilized for treatment of this patient, the contents of this document represent the material reviewed with the patient via the interpreter.     Payor: HUMANA MEDICARE / Plan: HUMANA GOLD PLUS HMO / Product Type: *No Product type* /      Not Medicaid Ins, no signature required for DC

## 2023-07-18 DIAGNOSIS — R0602 Shortness of breath: Secondary | ICD-10-CM | POA: Diagnosis not present

## 2023-07-18 DIAGNOSIS — I493 Ventricular premature depolarization: Secondary | ICD-10-CM | POA: Diagnosis present

## 2023-07-18 DIAGNOSIS — R000 Tachycardia, unspecified: Secondary | ICD-10-CM | POA: Diagnosis not present

## 2023-07-18 DIAGNOSIS — Z6838 Body mass index (BMI) 38.0-38.9, adult: Secondary | ICD-10-CM | POA: Diagnosis not present

## 2023-07-18 DIAGNOSIS — C183 Malignant neoplasm of hepatic flexure: Secondary | ICD-10-CM | POA: Diagnosis present

## 2023-07-18 DIAGNOSIS — I2489 Other forms of acute ischemic heart disease: Secondary | ICD-10-CM | POA: Diagnosis present

## 2023-07-18 DIAGNOSIS — D259 Leiomyoma of uterus, unspecified: Secondary | ICD-10-CM | POA: Diagnosis not present

## 2023-07-18 DIAGNOSIS — I517 Cardiomegaly: Secondary | ICD-10-CM | POA: Diagnosis not present

## 2023-07-18 DIAGNOSIS — K922 Gastrointestinal hemorrhage, unspecified: Secondary | ICD-10-CM | POA: Diagnosis present

## 2023-07-18 DIAGNOSIS — B9681 Helicobacter pylori [H. pylori] as the cause of diseases classified elsewhere: Secondary | ICD-10-CM | POA: Diagnosis not present

## 2023-07-18 DIAGNOSIS — C189 Malignant neoplasm of colon, unspecified: Secondary | ICD-10-CM | POA: Diagnosis not present

## 2023-07-18 DIAGNOSIS — C182 Malignant neoplasm of ascending colon: Secondary | ICD-10-CM | POA: Diagnosis not present

## 2023-07-18 DIAGNOSIS — K6389 Other specified diseases of intestine: Secondary | ICD-10-CM | POA: Diagnosis not present

## 2023-07-18 DIAGNOSIS — K8020 Calculus of gallbladder without cholecystitis without obstruction: Secondary | ICD-10-CM | POA: Diagnosis not present

## 2023-07-18 DIAGNOSIS — D649 Anemia, unspecified: Secondary | ICD-10-CM | POA: Diagnosis not present

## 2023-07-18 DIAGNOSIS — Z7982 Long term (current) use of aspirin: Secondary | ICD-10-CM | POA: Diagnosis not present

## 2023-07-18 DIAGNOSIS — S79811A Other specified injuries of right hip, initial encounter: Secondary | ICD-10-CM | POA: Diagnosis present

## 2023-07-18 DIAGNOSIS — D500 Iron deficiency anemia secondary to blood loss (chronic): Secondary | ICD-10-CM | POA: Diagnosis not present

## 2023-07-18 DIAGNOSIS — C787 Secondary malignant neoplasm of liver and intrahepatic bile duct: Secondary | ICD-10-CM | POA: Diagnosis present

## 2023-07-18 DIAGNOSIS — K2970 Gastritis, unspecified, without bleeding: Secondary | ICD-10-CM | POA: Diagnosis not present

## 2023-07-18 DIAGNOSIS — K7689 Other specified diseases of liver: Secondary | ICD-10-CM | POA: Diagnosis not present

## 2023-07-18 DIAGNOSIS — K640 First degree hemorrhoids: Secondary | ICD-10-CM | POA: Diagnosis not present

## 2023-07-18 DIAGNOSIS — R918 Other nonspecific abnormal finding of lung field: Secondary | ICD-10-CM | POA: Diagnosis not present

## 2023-07-18 DIAGNOSIS — A048 Other specified bacterial intestinal infections: Secondary | ICD-10-CM | POA: Diagnosis not present

## 2023-07-18 DIAGNOSIS — D509 Iron deficiency anemia, unspecified: Secondary | ICD-10-CM | POA: Diagnosis not present

## 2023-07-18 DIAGNOSIS — R0789 Other chest pain: Secondary | ICD-10-CM | POA: Diagnosis not present

## 2023-07-18 DIAGNOSIS — K3189 Other diseases of stomach and duodenum: Secondary | ICD-10-CM | POA: Diagnosis not present

## 2023-07-18 DIAGNOSIS — N179 Acute kidney failure, unspecified: Secondary | ICD-10-CM | POA: Diagnosis present

## 2023-07-18 DIAGNOSIS — D630 Anemia in neoplastic disease: Secondary | ICD-10-CM | POA: Diagnosis present

## 2023-07-18 DIAGNOSIS — E119 Type 2 diabetes mellitus without complications: Secondary | ICD-10-CM | POA: Diagnosis not present

## 2023-07-18 DIAGNOSIS — R079 Chest pain, unspecified: Secondary | ICD-10-CM | POA: Diagnosis not present

## 2023-07-18 DIAGNOSIS — Z7984 Long term (current) use of oral hypoglycemic drugs: Secondary | ICD-10-CM | POA: Diagnosis not present

## 2023-07-18 DIAGNOSIS — S79911A Unspecified injury of right hip, initial encounter: Secondary | ICD-10-CM | POA: Diagnosis not present

## 2023-07-18 DIAGNOSIS — K2960 Other gastritis without bleeding: Secondary | ICD-10-CM | POA: Diagnosis present

## 2023-07-18 DIAGNOSIS — Z885 Allergy status to narcotic agent status: Secondary | ICD-10-CM | POA: Diagnosis not present

## 2023-07-18 DIAGNOSIS — R7989 Other specified abnormal findings of blood chemistry: Secondary | ICD-10-CM | POA: Diagnosis not present

## 2023-07-18 DIAGNOSIS — K5669 Other partial intestinal obstruction: Secondary | ICD-10-CM | POA: Diagnosis present

## 2023-07-18 DIAGNOSIS — K635 Polyp of colon: Secondary | ICD-10-CM | POA: Diagnosis not present

## 2023-07-18 DIAGNOSIS — R55 Syncope and collapse: Secondary | ICD-10-CM | POA: Diagnosis present

## 2023-07-18 DIAGNOSIS — I10 Essential (primary) hypertension: Secondary | ICD-10-CM | POA: Diagnosis present

## 2023-07-18 DIAGNOSIS — D62 Acute posthemorrhagic anemia: Secondary | ICD-10-CM | POA: Diagnosis present

## 2023-07-18 DIAGNOSIS — F419 Anxiety disorder, unspecified: Secondary | ICD-10-CM | POA: Diagnosis present

## 2023-07-19 DIAGNOSIS — D62 Acute posthemorrhagic anemia: Secondary | ICD-10-CM | POA: Diagnosis present

## 2023-07-19 DIAGNOSIS — D630 Anemia in neoplastic disease: Secondary | ICD-10-CM | POA: Diagnosis present

## 2023-07-19 DIAGNOSIS — K2970 Gastritis, unspecified, without bleeding: Secondary | ICD-10-CM | POA: Diagnosis not present

## 2023-07-19 DIAGNOSIS — D500 Iron deficiency anemia secondary to blood loss (chronic): Secondary | ICD-10-CM | POA: Diagnosis not present

## 2023-07-19 DIAGNOSIS — K3189 Other diseases of stomach and duodenum: Secondary | ICD-10-CM | POA: Diagnosis present

## 2023-07-19 DIAGNOSIS — I493 Ventricular premature depolarization: Secondary | ICD-10-CM | POA: Diagnosis present

## 2023-07-19 DIAGNOSIS — Z7982 Long term (current) use of aspirin: Secondary | ICD-10-CM | POA: Diagnosis not present

## 2023-07-19 DIAGNOSIS — K5669 Other partial intestinal obstruction: Secondary | ICD-10-CM | POA: Diagnosis present

## 2023-07-19 DIAGNOSIS — B9681 Helicobacter pylori [H. pylori] as the cause of diseases classified elsewhere: Secondary | ICD-10-CM | POA: Diagnosis present

## 2023-07-19 DIAGNOSIS — Z7984 Long term (current) use of oral hypoglycemic drugs: Secondary | ICD-10-CM | POA: Diagnosis not present

## 2023-07-19 DIAGNOSIS — R079 Chest pain, unspecified: Secondary | ICD-10-CM | POA: Diagnosis not present

## 2023-07-19 DIAGNOSIS — N179 Acute kidney failure, unspecified: Secondary | ICD-10-CM | POA: Diagnosis present

## 2023-07-19 DIAGNOSIS — S79811A Other specified injuries of right hip, initial encounter: Secondary | ICD-10-CM | POA: Diagnosis present

## 2023-07-19 DIAGNOSIS — I2489 Other forms of acute ischemic heart disease: Secondary | ICD-10-CM | POA: Diagnosis present

## 2023-07-19 DIAGNOSIS — R7989 Other specified abnormal findings of blood chemistry: Secondary | ICD-10-CM | POA: Diagnosis not present

## 2023-07-19 DIAGNOSIS — R918 Other nonspecific abnormal finding of lung field: Secondary | ICD-10-CM | POA: Diagnosis not present

## 2023-07-19 DIAGNOSIS — K7689 Other specified diseases of liver: Secondary | ICD-10-CM | POA: Diagnosis not present

## 2023-07-19 DIAGNOSIS — A048 Other specified bacterial intestinal infections: Secondary | ICD-10-CM | POA: Diagnosis not present

## 2023-07-19 DIAGNOSIS — K6389 Other specified diseases of intestine: Secondary | ICD-10-CM | POA: Diagnosis not present

## 2023-07-19 DIAGNOSIS — D509 Iron deficiency anemia, unspecified: Secondary | ICD-10-CM | POA: Diagnosis not present

## 2023-07-19 DIAGNOSIS — D649 Anemia, unspecified: Secondary | ICD-10-CM | POA: Diagnosis not present

## 2023-07-19 DIAGNOSIS — K8020 Calculus of gallbladder without cholecystitis without obstruction: Secondary | ICD-10-CM | POA: Diagnosis not present

## 2023-07-19 DIAGNOSIS — K635 Polyp of colon: Secondary | ICD-10-CM | POA: Diagnosis present

## 2023-07-19 DIAGNOSIS — Z885 Allergy status to narcotic agent status: Secondary | ICD-10-CM | POA: Diagnosis not present

## 2023-07-19 DIAGNOSIS — Z6838 Body mass index (BMI) 38.0-38.9, adult: Secondary | ICD-10-CM | POA: Diagnosis not present

## 2023-07-19 DIAGNOSIS — D259 Leiomyoma of uterus, unspecified: Secondary | ICD-10-CM | POA: Diagnosis not present

## 2023-07-19 DIAGNOSIS — R55 Syncope and collapse: Secondary | ICD-10-CM | POA: Diagnosis present

## 2023-07-19 DIAGNOSIS — K922 Gastrointestinal hemorrhage, unspecified: Secondary | ICD-10-CM | POA: Diagnosis present

## 2023-07-19 DIAGNOSIS — I517 Cardiomegaly: Secondary | ICD-10-CM | POA: Diagnosis not present

## 2023-07-19 DIAGNOSIS — C787 Secondary malignant neoplasm of liver and intrahepatic bile duct: Secondary | ICD-10-CM | POA: Diagnosis present

## 2023-07-19 DIAGNOSIS — C183 Malignant neoplasm of hepatic flexure: Secondary | ICD-10-CM | POA: Diagnosis present

## 2023-07-19 DIAGNOSIS — C189 Malignant neoplasm of colon, unspecified: Secondary | ICD-10-CM | POA: Diagnosis not present

## 2023-07-19 DIAGNOSIS — E119 Type 2 diabetes mellitus without complications: Secondary | ICD-10-CM | POA: Diagnosis present

## 2023-07-19 DIAGNOSIS — I10 Essential (primary) hypertension: Secondary | ICD-10-CM | POA: Diagnosis present

## 2023-07-19 DIAGNOSIS — C182 Malignant neoplasm of ascending colon: Secondary | ICD-10-CM | POA: Diagnosis not present

## 2023-07-19 DIAGNOSIS — R0602 Shortness of breath: Secondary | ICD-10-CM | POA: Diagnosis not present

## 2023-07-19 DIAGNOSIS — K2960 Other gastritis without bleeding: Secondary | ICD-10-CM | POA: Diagnosis present

## 2023-07-19 DIAGNOSIS — F419 Anxiety disorder, unspecified: Secondary | ICD-10-CM | POA: Diagnosis present

## 2023-07-19 DIAGNOSIS — K640 First degree hemorrhoids: Secondary | ICD-10-CM | POA: Diagnosis present

## 2023-07-30 DIAGNOSIS — C183 Malignant neoplasm of hepatic flexure: Secondary | ICD-10-CM | POA: Diagnosis not present

## 2023-07-30 DIAGNOSIS — D509 Iron deficiency anemia, unspecified: Secondary | ICD-10-CM | POA: Diagnosis not present

## 2023-08-13 DIAGNOSIS — C183 Malignant neoplasm of hepatic flexure: Secondary | ICD-10-CM | POA: Diagnosis not present

## 2023-08-13 DIAGNOSIS — K8020 Calculus of gallbladder without cholecystitis without obstruction: Secondary | ICD-10-CM | POA: Diagnosis not present

## 2023-08-13 DIAGNOSIS — K7689 Other specified diseases of liver: Secondary | ICD-10-CM | POA: Diagnosis not present

## 2023-08-13 DIAGNOSIS — D259 Leiomyoma of uterus, unspecified: Secondary | ICD-10-CM | POA: Diagnosis not present

## 2023-08-16 ENCOUNTER — Ambulatory Visit
Admit: 2023-08-16 | Discharge: 2023-08-16 | Payer: MEDICARE | Attending: Orthopaedic Surgery | Primary: Internal Medicine

## 2023-08-16 VITALS — Ht 62.0 in | Wt 143.0 lb

## 2023-08-16 DIAGNOSIS — M1712 Unilateral primary osteoarthritis, left knee: Secondary | ICD-10-CM

## 2023-08-16 NOTE — Progress Notes (Signed)
 Patient: Elaine [name]                MRN: [id number]       SSN: [id number]  Date of Birth: Nov 20, 1951        AGE: 72 y.o.        SEX: female    PCP: Waldon Marcus CROME, MD  08/16/23    Chief Complaint: Knee Pain (Left knee)      1. Primary osteoarthritis of left knee  -     Ambulatory Referral to Ortho Injection  2. Status post total replacement of left hip  3. Chronic left shoulder pain  -     External Referral To Orthopedic Surgery  4. History of GI bleed  5. Pre-diabetes        HPI:  Elaine [name] is a 72 y.o. female with chief complaint of   Chief Complaint   Patient presents with    Hip Pain     Lt      -05/10/2023: Left knee cortisone injection, 2 months of excellent relief  -01/08/2023: Left total hip arthroplasty, posterior lateral approach    We have been planning on a left total hip arthroplasty through the anterolateral approach due to some pain in the lateral hip as well as a overhanging pannus.  She recently, in July, had a GI bleed and has had a cascade of medical issues since then.  She is hoping that she can have her hip replaced at the beginning of the year.        08/16/2023     9:15 AM   AMB PAIN ASSESSMENT   Location of Pain Knee   Location Modifiers Left   Severity of Pain 0     Tobacco Use: Medium Risk (08/16/2023)    Patient History     Smoking Tobacco Use: Former     Smokeless Tobacco Use: Never     Passive Exposure: Never     IMAGING:  Imaging read by myself and interpreted as follows:    May 10, 2023:  4 view x-ray of the left knee including AP, lateral, sunrise and notch views demonstrate tricompartmental osteoarthritic signs most affecting the lateral compartment which has bone-on-bone articulation with subchondral cysts and subchondral sclerosis.  There are tricompartmental osteophytes.  There is also subchondral sclerosis and mild lateral patellar tilt in the patellofemoral joint.      February 01, 2023:  Three-view x-ray of the left hip including AP pelvis and AP and lateral of the  left hip demonstrates maintenance of the position of the implants without change in position or evidence of subsidence or fracture.  No evidence of complication.  The left leg is approximately centimeter longer than the right with mildly increased offset.      January 18, 2023:  3 view x-ray of the left hip including AP pelvis, AP and lateral, demonstrates a well positioned left total hip arthroplasty without signs of loosening or complication. Leg lengths appear to be equal.     Dec 08, 2022:  3 view x-ray of the left hip including AP pelvis, and AP and lateral demonstrating severe joint space narrowing with evidence of osteophytosis, subchondral sclerosis, and subchondral cyst formation.     2 view x-ray of the lumbar spine, sitting and standing, demonstrates facet joint arthropathy and decreased disc space at multiple levels. The change in SS is 21.2.     September 25, 2022:  3 view x-ray of the left hip including AP pelvis  and AP and crosstable lateral of the left hip demonstrate significant joint space loss acetabular joint of approximately 90%.  There is osteophytosis,  subchondral sclerosis and subchondral cyst formation.      PHYSICAL EXAMINATION:  Ht 1.575 m (5' 2)   Wt 64.9 kg (143 lb)   BMI 26.16 kg/m   Body mass index is 26.16 kg/m.  Wt Readings from Last 3 Encounters:   08/16/23 64.9 kg (143 lb)   07/02/23 64.9 kg (143 lb)   05/10/23 64.4 kg (142 lb)       GENERAL: Alert and oriented x3, in no acute distress.  HEENT: Normocephalic, atraumatic.    MSK: Left Hip Exam     Tenderness   The patient is experiencing no tenderness.     Range of Motion   Flexion:  110   External rotation:  30   Internal rotation: 10     Muscle Strength   Abduction: 5/5   Adduction: 5/5   Flexion: 5/5     Other   Erythema: absent  Scars: present  Sensation: normal  Pulse: present    Comments:  Incision consistent with posterior lateral approach is clean dry and intact.  No erythema and no tenderness to palpation.   Well-healed        ASSESSMENT & PLAN:    August 16, 2023:  Left knee osteoarthritis and status post left total hip arthroplasty of the posterior lateral approach.  She continues to be doing very well with the left hip.  The left knee has not been bothering her.  She got excellent relief from her knee shots for about 2 months but it is still not as bad as it was before the shot.  I discussed gel injections with her and she would like to move forward with those in the hopes of having a longer lasting relief as well as not risking elevation in her blood sugar with her diabetes.  I will have her back when the shots are approved.  Of note she is also complaining of significant left shoulder pain and is having restricted range of motion.  She uses patches continuously to help with that pain.  I will refer to Dr. Luciano for further evaluation.      May 10, 2023:  Left knee osteoarthritis and 30-month status post left total hip arthroplasty through posterior lateral approach.  She is having no pain in her hip but her knee is bothering her quite a bit.  She will get sharp pains in from time to time.  It hurts in both the medial and lateral sides of the joint.  We talked about over-the-counter medications and she is already taking Tylenol  and that knee continues to hurt.  Due to her history of GI bleed she cannot take NSAIDs.  I offered her a cortisone injection and she would like to move forward with it.  We did discuss that with her prediabetes it can elevate her blood sugar and potentially her A1c slightly.  The shot was given and she tolerated it well.  I like to see her back and 3 months for reevaluation of the left knee.  I did prescribe her Voltaren  gel.    Apr 12, 2023:  3 months status post left total hip arthroplasty, posterolateral approach.  Assessment: Patient presents today after calling after-hours services this weekend due to swelling in her left ankle and foot.  She denies ever having any pain or redness,  simply swelling.  She was instructed  to go to the emergency department if she began to develop shortness of breath or calf pain/swelling/discoloration.  She denies ever feeling the symptoms but was instructed to come into the office to be seen on Monday.  She is in no pain today and states that she never hit her ankle or anything and does not remember an injury.  She has no tenderness to palpation just a small area of effusion just anterior to her lateral malleolus.  Since she called her swelling has improved by almost 90%.  She denies ever having any hip pain or pain down her leg.  Plan: Original plan for home PT and then transition to physical therapy  Follow up: In 2 months to check on her progress.  No x-rays will be needed.      March 01, 2023:  6-week status post left total hip arthroplasty through posterior lateral approach.  She is doing very well and she is very happy.  She is not having any pain and she is able to ambulate short distances without her rollator but is not completely steady on her feet yet.  She has graduated from home physical therapy and is starting outpatient physical therapy later this week.  I like to see her again in 2 months to check on her progress.  No x-rays needed at that visit.      February 01, 2023:  3 and half weeks status post left total hip arthroplasty through the posterior lateral approach.  Patient had a fall about 4 days ago when she slipped on the hardwood floor wearing socks without grippers on the.  She reports that she is feeling much better and she is ambulating well today.  She is complaining more of left shoulder pain than her hip.  She has seen Dr. Mee in the past and they urged her to make a new appointment with him.  I will have home health work with the patient for the next 2 weeks as well as do a home assessment.  If she needs further physical therapy following those 2 weeks we can order outpatient physical therapy.  She does have a small abrasion in the  gluteal cleft which is not concerning and seems to be healing well.  I will see her in 6 weeks and no x-rays will be needed unless there is any increasing pain.    January 18, 2023:  10 days s/p left THA, lateral approach. Pt is continuing to improve. Her pain fluctuates based off of her activity level. She was getting up and walking 8-10 steps every hour but this started to increase her pain so they have cut it back to every 2 hours but continue to do exercise while she is seated. She had an allergic reaction to the oxycodone  that was given to her post op and she developed intense itching. She stopped the oxycodone  once this started and I prescribed her hydroxyzine  due to her allergy to benadryl. The itching has subsided since. She is currently taking tramadol  for her pain every 8 hours along with tylenol  and reports that this is helping with her pain. She is also taking the eliquis  prescribed to her due to her aspirin allergy. She denies calf pain and has no calf pain, swelling, and negative homan's on exam. Her incision is clean, dry, and intact without ttp or erythema. She states that most of her pain is around her incision. She continues to walk with a walker for support. We will have her follow up  with Dr. Marlyse on 4 weeks.     She also asked for lidocaine  patches today, which were prescribed, and I will refill her tramadol  when it is due this coming Friday.       December 08, 2022:  Primary osteoarthritis of the left hip. Pt is here for her H&P appointment prior to her 12/23/22 THA. 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 She has been cleared by her PCP for surgery and will be following up with her hematologist prior to her surgery for clearance due to her chronic iron deficiency anemia and low hemoglobin numbers on her last labs. They are out of the [facility] network, so they will be  faxing that over once completed. She receives iron infusions every other month and had her last one in November.     She has allergies to aspirin and diphenhydramine. Due to this she will get lovenox  30 mg as DVT prophylaxis. Due to her history of GI bleed, we will not prescribe meloxiam. I will order her post operative medications once she receives clearance from hematology.     September 25, 2022:  We will continue to plan on doing a left total hip arthroplasty through an anterolateral approach.  Again addressed the benefits and risks of anterolateral versus direct anterior the patient would like to continue the anterior lateral plan.  She does need to improve her health and get stabilized with recent GI bleed.  We will hope to move forward after the first of the year.    Apr 20, 2022:  She failed conservative treatment with Tylenol , ibuprofen and gabapentin .  She did get a left hip corticosteroid injection intra-articularly which gave her only a month of greater than 90% relief of her pain.  At this point would like to go forward with a left total hip replacement through the anterolateral approach.  I did discuss with her the risks and benefits of a direct anterior versus anterolateral approach and she did agree to go forward with anterolateral approach.  She has an overhanging pannus that reaches down the thigh quite a bit.  She is currently working on adjusting her thyroid medications to get her hypothyroidism under control.  Her other medical comorbidities under medication control at this time.  She is currently working in childcare and would like to go forward with surgery in July as her last day of work is the end of June.      Past Medical History:   Diagnosis Date    Anemia     Asthma     Colon polyp     DDD (degenerative disc disease) 02/15/2009    Depression 12/18/2011    DVT (deep venous thrombosis) (HCC) 10/23/2009    Left leg:  No DVT.      Essential hypertension, benign     History of blood transfusion      History of echocardiogram 03/20/2004    EF 70%.  Borderline DDfx.  No significant valvular pathology.    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  Neg EKG on pharm stress test.    Thyroid disease     hypothyroidism       Family History   Problem Relation Age of Onset    Hypertension Sister         x3    Hypertension Mother     Cancer Mother     Heart Surgery Sister     Heart Disease Sister     Hypertension Paternal Uncle     Cancer Maternal Aunt         breast    Breast Cancer Maternal Aunt     Glaucoma Son     Heart Defect Son     Diabetes Paternal Uncle     Heart Attack Sister        Current Outpatient Medications   Medication Sig Dispense Refill    levothyroxine  (SYNTHROID ) 88 MCG tablet Take 1 tablet by mouth daily 90 tablet 3    valsartan  (DIOVAN ) 160 MG tablet Take 1 tablet by mouth daily 90 tablet 1    montelukast  (SINGULAIR ) 10 MG tablet TAKE 1 TABLET EVERY DAY 90 tablet 3    vitamin C (ASCORBIC ACID) 500 MG tablet Take 1 tablet by mouth daily      Multiple Vitamins-Minerals (MULTIVITAMIN ADULTS 50+ PO) Take 1 tablet by mouth daily      amLODIPine  (NORVASC ) 5 MG tablet Take 1 tablet by mouth daily 90 tablet 3    Misc Natural Products (NEURIVA PO) Take 1 tablet by mouth in the morning and at bedtime      NONFORMULARY Take 1 tablet by mouth every evening Viviscal -1 tablet every evening for hair and scalp      potassium chloride  (KLOR-CON   M) 10 MEQ extended release tablet TAKE 1 TABLET EVERY DAY 90 tablet 3    atorvastatin  (LIPITOR) 40 MG tablet TAKE 1 TABLET EVERY DAY (NEED MD APPOINTMENT) 90 tablet 3    albuterol  sulfate HFA (PROVENTIL ;VENTOLIN ;PROAIR ) 108 (90 Base) MCG/ACT inhaler Inhale 2 puffs into the lungs every 4 hours as needed for Wheezing or Shortness of Breath 18 g 5    ZINC PO Take 1 tablet by mouth daily      iron dextran complex (INFED) 50 MG/ML injection Infuse 19.5 mLs intravenously Pt gets these every other month      latanoprost (XALATAN) 0.005 % ophthalmic solution Place 1 drop into both eyes nightly      lidocaine  (LIDODERM ) 5 % Place 1 patch onto the skin every 24 hours 12 hours on and 12 hours off      diclofenac  sodium (VOLTAREN ) 1 % GEL Apply 4 g topically 4 times daily (Patient not taking: Reported on 07/02/2023) 350 g 5    vitamin D 25 MCG (1000 UT) CAPS Take 1 capsule by mouth daily (Patient not taking: Reported on 07/02/2023)      acetaminophen  (TYLENOL ) 500 MG tablet Take 2 tablets by mouth in the morning and 2 tablets at noon and 2 tablets in the evening. 180 tablet 0    gabapentin  (NEURONTIN ) 300 MG capsule Take 1 capsule by mouth 3 times daily for 180 days. Max Daily Amount: 900 mg (Patient taking differently: Take  1 capsule by mouth 3 times daily. Taking one in the morning and two in the evening) 270 capsule 1    cyanocobalamin 1000 MCG tablet Take 1 tablet by mouth every evening (Patient not taking: Reported on 07/02/2023)       No current facility-administered medications for this visit.        Allergies   Allergen Reactions    Aspirin Other (See Comments)     GI distress,     Diphenhydramine Other (See Comments)     Muscle jerking    Pollen Extract      Other reaction(s): Not Reported This Time       Past Surgical History:   Procedure Laterality Date    BREAST BIOPSY      BREAST SURGERY      COLONOSCOPY N/A 04/25/2020    SCREENING COLONOSCOPY performed by Pattie Dale PARAS, MD at [facility] ENDOSCOPY    COLONOSCOPY  02-24-12     normal (Dr. Pattie)    COLONOSCOPY FLX DX W/COLLJ [facility] WHEN [facility]  02-2007    +polyp(tubular adenoma); Dr Pattie    HERNIA REPAIR      3x    HYSTERECTOMY (CERVIX STATUS UNKNOWN)      NEUROLOGICAL SURGERY  03-16-2009    s/p ACD & Fusion (Dr.P.Gurtner)    TOTAL HIP ARTHROPLASTY Left 01/08/2023    LEFT HIP TOTAL ARTHROPLASTY LATERAL APPROACH performed by Marlyse Morene ORN, DO at [facility] MAIN OR    TUBAL LIGATION      UPPER GASTROINTESTINAL ENDOSCOPY N/A 06/12/2022    ENTEROSCOPY PUSH DIAGNOSTIC w/small intestinal bipolar cautery of multiple AVMs performed by Dale PARAS Pattie, MD at [facility] ENDOSCOPY       Social History     Socioeconomic History    Marital status: Divorced     Spouse name: Not on file    Number of children: Not on file    Years of education: Not on file    Highest education level: Not on file   Occupational History    Not on file   Tobacco Use    Smoking status: Former     Current packs/day: 0.00     Average packs/day: 1 pack/day for 27.0 years (27.0 ttl pk-yrs)     Types: Cigarettes     Start date: 10/16/1977     Quit date: 04/25/2004     Years since quitting: 19.3     Passive exposure: Never    Smokeless tobacco: Never   Vaping Use    Vaping status: Never Used   Substance and Sexual Activity    Alcohol use: No    Drug use: Never    Sexual activity: Not Currently   Other Topics Concern    Not on file   Social History Narrative    Not on file     Social Determinants of Health     Financial Resource Strain: Low Risk  (01/04/2023)    Overall Financial Resource Strain (CARDIA)     Difficulty of Paying Living Expenses: Not hard at all   Food Insecurity: No Food Insecurity (01/08/2023)    Hunger Vital Sign     Worried About Running Out of Food in the Last Year: Never true     Ran Out of Food in the Last Year: Never true   Transportation Needs: No Transportation Needs (02/26/2023)    OASIS A1250: Transportation     Lack of Transportation (Medical): No     Lack of Transportation (Non-Medical): No  Patient Unable or  Declines to Respond: No   Physical Activity: Inactive (01/04/2023)    Exercise Vital Sign     Days of Exercise per Week: 0 days     Minutes of Exercise per Session: 0 min   Stress: No Stress Concern Present (01/04/2023)    [employer] of Occupational [employer] - Occupational Stress Questionnaire     Feeling of Stress : Not at all   Social Connections: Feeling Socially Integrated (02/26/2023)    OASIS D0700: Social Isolation     Frequency of experiencing loneliness or isolation: Never   Intimate Partner Violence: Not At Risk (01/04/2023)    Humiliation, Afraid, Rape, and Kick questionnaire     Fear of Current or Ex-Partner: No     Emotionally Abused: No     Physically Abused: No     Sexually Abused: No   Housing Stability: Low Risk  (01/08/2023)    Housing Stability Vital Sign     Unable to Pay for Housing in the Last Year: No     Number of Places Lived in the Last Year: 1     Unstable Housing in the Last Year: No       REVIEW OF SYSTEMS:      No changes from previous review of systems unless noted.      Prescription medication management discussed with patient.     Electronically signed by: Morene LELON Dills, DO    Note: This note was completed using voice recognition software.  Any typographical/name errors or mistakes are unintentional.

## 2023-08-17 ENCOUNTER — Encounter

## 2023-08-17 MED ORDER — GABAPENTIN 300 MG PO CAPS
300 MG | ORAL_CAPSULE | ORAL | 1 refills | Status: DC
Start: 2023-08-17 — End: 2024-04-28

## 2023-08-17 NOTE — Telephone Encounter (Signed)
 [place] 08/17/2023    The last fill date for Gabapentin  300 Mg Capsule was 03/10/2023 for a 90 d/s qty 270      Last UDS: completed     CSA Last signed: 03/01/2023        PCP: Waldon Marcus CROME, MD    Last appt: 09/30/2023   Future Appointments   Date Time Provider Department Center   08/25/2023  2:30 PM Vale Hover, MD [facility] AMB   09/16/2023 10:20 AM Gerard Danas, PA-C [facility] Sched   09/24/2023  3:00 PM [facility] LAB VISIT [facility] DEP   10/01/2023 11:00 AM Hunte, Marcus CROME, MD [facility] DEP       Requested Prescriptions     Pending Prescriptions Disp Refills    gabapentin  (NEURONTIN ) 300 MG capsule [Pharmacy Med Name: Gabapentin  Oral Capsule 300 MG] 270 capsule      Sig: TAKE 1 CAPSULE THREE TIMES DAILY. MAX DAILY AMOUNT: 900MG 

## 2023-08-18 NOTE — Progress Notes (Signed)
 Elaine [name]  Appointment: 08/18/2023 2:45 PM  Location: [place] [facility]  Patient #: [phone]  DOB: [birth year]  Undefined / Language: Isadora / Race: Black or African American  Female      History of Present Illness Suzon Coss MD; 08/18/2023 3:28 PM)  The patient is a 72 year old female who presents for a Recheck of Renal Artery Stenosis.    Note: 72 y/o female referred for Atrophic rt kidney and CKD    PMH:    1) h/o graves disease __> radioactive iodine  --> hypothyroid state --> synthroid   2) vit d deficiency  3) asthma  4) chr diastolic CHF  5) atrophic rt kidney , CKD  6) chiari malformation type 1  7) concern for RAS    denies any symptoms  creat 0.5  egfr > 60  denies urinary symptoms  no SOB/CP  renal USG --> no obst rt nephrolithiasis with mild rt cortical thinning  rt sided atrophic kidney  DMSA scan 26% Rt kidney , 74% left kidney  CT abd with small rt kidney , rt renal vasculature narrowing  bil adrenal hyperplasia  aldo/renin ratio of 33 , which is high indicating mineralocorticoid excess    Problem List/Past Medical Merced Denham; 08/18/2023 3:05 PM)  CHF (congestive heart failure) (I50.9)    Abnormal stress test (R94.39)    GERD (gastroesophageal reflux disease) (K21.9)    Gastrointestinal hemorrhage (K92.2)    Hypothyroidism (E03.9)    URI (upper respiratory infection) (J06.9)    Asthma (J45.909)    Osteoarthritis (M19.90)    Sciatica (M54.30)    Chronic low back pain (M54.50)    Depression (F32.A)    DOE (dyspnea on exertion) (R06.09)    Memory problem (R41.3)    Meralgia paraesthetica (G57.10)    Chronic migraine w/o aura, not intractable, w/o stat migr (G43.709)    Claustrophobia (F40.240)    Submental lymphadenopathy (R59.0)    Vitamin D deficiency (E55.9)    HTN (hypertension) (I10)    Iron deficiency anemia (D50.9)    HLD (hyperlipidemia) (E78.5)    Renal artery stenosis (I70.1)    Right renal atrophy (N26.1)    Overweight (BMI 25.0 to 29.9) (E66.3)    Pre-diabetes (R73.03)    Problems  Reconciled      Allergies Merced Denham; 08/18/2023 3:05 PM)  aspirin    diphenhydramine    POLLEN EXTRACTS    hydrocodone   Rash.  codeine    Allergies Reconciled      Social History Juris M. Rodgers; 08/12/2023 8:59 AM)  Tobacco use   Former smoker.  Non Drinker/No Alcohol Use    No Drug Use      Medication History Merced Denham; 08/18/2023 3:05 PM)  gabapentin   (300mg  capsule, 1 oral Two times daily) Active.  albuterol  sulfate  (4mcg/actuat Aerosol Powder,, 2 puffs inhalation every 4 hours as needed) Active.  potassium chloride   (10mEq capsule, extend, 1 oral daily) Active.  atorvastatin   (40mg  tablet, 1 oral daily) Active.  latanoprost  (0.005% drops, 1 drop both eyes ophthalmic (eye) at bedtime) Active.  lidocaine   (5% adhesive patch,, 1 patch topical AS needed) Active.  iron dextran  (50mg /mL solution, 19.5 ml injection injection AS needed) Active.  cyanocobalamin (vitamin B-12)  (1,000mcg tablet, 1 oral daily) Active.  Vitamin D3  (25 mcg(1,000 uni tablet, 1 oral daily) Active.  Vitamin C  (500mg  tablet, 1 oral daily) Active.  levothyroxine   (100mcg tablet, 1 oral daily) Active.  montelukast   (10mg  tablet,  1 oral daily) Active.  amLODIPine   (5mg  tablet, 1 oral at bedtime) Active.  VIVISCAL TABLET  (1 Two times daily) Active.  NEURIVA PO  (1 Daily) Active.  zinc gluconate  (30mg  tablet, 1 oral Daily) Active.  Medications Reconciled     Health Maintenance History Juris M. Rodgers; 08/12/2023 8:59 AM)  Mammogram, Screening   [04/2023]:  Colonoscopy, Screening   [05/2022]:    Other Problems Merced Denham; 08/18/2023 3:05 PM)  Portal Access Education (Z71.9)          Review of Systems Suzon Coss MD; 08/18/2023 3:16 PM)  General Not Present- Anorexia, Chills, Fatigue and Fever.  Skin Not Present- Bruising, Pruritus, Rash and Ulcer.  HEENT Not Present- Dry Mucous Membranes, Dysgeusia, Oral Ulcers, Periorbital Puffiness and Sore Throat.  Respiratory Not Present- Cough, Difficulty Breathing on Exertion, Dyspnea  and Hemoptysis.  Cardiovascular Not Present- Chest Pain, Claudications, Orthopnea, Palpitations, Paroxysmal Nocturnal Dyspnea and Swelling of Extremities.  Gastrointestinal Not Present- Abdominal Pain, Abdominal Swelling, Constipation, Diarrhea, Hematochezia, Melena, Nausea and Vomiting.  Musculoskeletal Not Present- Joint Pain, Joint Redness, Joint Stiffness, Joint Swelling, Leg Cramps and Myalgia.  Neurological Not Present- Dizziness, Headaches, Syncope and Trouble walking.  Endocrine Not Present- Appetite Changes, Excessive Thirst, Polydipsia and Polyuria.  Hematology Not Present- Abnormal Bleeding and Easy Bruising.    Vitals Merced Denham; 08/18/2023 3:07 PM)  08/18/2023 3:05 PM  Weight: 140 lb   Height: 62 in   Height was reported by patient.  Body Surface Area: 1.64 m   Body Mass Index: 25.61 kg/m    Pulse: 88 (Regular)    P.OX: 98% (Room air, FiO2: 21%)  BP: 128/62(Sitting, Left Arm, Standard)              Physical Exam Suzon Coss MD; 08/18/2023 3:16 PM)  Chest and Lung Exam  Auscultation  Breath sounds - Clear.    Cardiovascular  Auscultation  Rhythm - Regular. Heart Sounds - Normal heart sounds.    Abdomen  Palpation/Percussion  Palpation and Percussion of the abdomen reveal - Soft, Non Tender and No hepatosplenomegaly.  Auscultation  Auscultation of the abdomen reveals - Bowel sounds normal.    Peripheral Vascular  Upper Extremity  Palpation - Edema - Left - No edema - Left. Edema - Right - No edema - Right.        Assessment & Plan Suzon Coss MD; 08/18/2023 3:32 PM)    Right renal atrophy (N26.1)  Impression: 1) atrophic rt kidney , creat 0.5 , egfr > 60 , CKD2 , d/t rt sided renal artery stenosis , hypokelmia with metab alkalosis , aldo/renin ratio of 33 , DMSA scan 26% v/s 74% Rt and left kidney function  2) h/0 graves disease __> radioactive iodine  --> hypothyroid state --> synthroid   3) vit d deficiency  4) asthma  5) chr diastolic CHF  6) chiari malformation type 1  7) concern for  RAS      plan :    1) check BP daily , goal < 130/80  2) start aldactone 25 mg daily for hyperaldo  3) continue amlodipine   4) stop potassium supplement  5) avoid NSAIDs  6) labs in 2 weeks after starting aldactone    f/u 4 mths  please cc to Dr Waldon    Current Plans  PTH INTACT (16029)  RENAL FUNCTION PANEL (80069)  SPOT PROTEIN URINE (84156)  SPOT URINE CREATININE(82570)  URIC ACID BLOOD (84550)  URINALYSIS MICROSCOPY (81015)  Instructed to avoid non-steroidal anti-inflammatory drugs.  Renal artery stenosis (I70.1) <[id number] 108>      Pre-diabetes (R73.03)    Current Plans  MICROALBUMIN / CREATININE RATIO (17956)  Patient was instructed to follow up with Primary Care Physician for management of diabetes.    HTN (hypertension) (I10)    Current Plans  CBC (14974)    Vitamin D deficiency (E55.9)    Current Plans  VITAMIN D 25 LEVEL (17693)    Iron deficiency anemia (D50.9)      HLD (hyperlipidemia) (E78.5)    Current Plans  Patient was instructed to follow up with Primary Care Physician for management of lipids.    Portal Access Education (Z71.9)    Current Plans  Pt Education - How to Access Health Information Online using Patient Portal and 3rd Party Apps: discussed with patient and provided information.    Overweight (BMI 25.0 to 29.9) (E66.3)    Current Plans  LIFESTYLE EDUCATION REGARDING DIET [phone])  Pt Education - Healthy Diet: Brief Version *: healthy diet  Signed by Amelie Coss MD (08/18/2023 3:32 PM)

## 2023-08-25 ENCOUNTER — Ambulatory Visit: Payer: MEDICARE | Attending: Orthopaedic Surgery | Primary: Internal Medicine

## 2023-09-06 DIAGNOSIS — D509 Iron deficiency anemia, unspecified: Secondary | ICD-10-CM | POA: Diagnosis not present

## 2023-09-06 DIAGNOSIS — C183 Malignant neoplasm of hepatic flexure: Secondary | ICD-10-CM | POA: Diagnosis not present

## 2023-09-06 DIAGNOSIS — C182 Malignant neoplasm of ascending colon: Secondary | ICD-10-CM | POA: Diagnosis not present

## 2023-09-07 ENCOUNTER — Ambulatory Visit
Admit: 2023-09-07 | Discharge: 2023-09-07 | Payer: MEDICARE | Attending: Orthopaedic Surgery | Primary: Internal Medicine

## 2023-09-07 DIAGNOSIS — C183 Malignant neoplasm of hepatic flexure: Secondary | ICD-10-CM | POA: Diagnosis not present

## 2023-09-07 DIAGNOSIS — D509 Iron deficiency anemia, unspecified: Secondary | ICD-10-CM | POA: Diagnosis not present

## 2023-09-07 DIAGNOSIS — M25512 Pain in left shoulder: Secondary | ICD-10-CM

## 2023-09-07 MED ORDER — TRIAMCINOLONE ACETONIDE 40 MG/ML IJ SUSP
40 | Freq: Once | INTRAMUSCULAR | Status: AC
Start: 2023-09-07 — End: 2023-09-07

## 2023-09-07 MED ORDER — LIDOCAINE HCL 1 % IJ SOLN
1 | Freq: Once | INTRAMUSCULAR | Status: AC
Start: 2023-09-07 — End: 2023-09-07

## 2023-09-07 MED ADMIN — lidocaine 1 % injection 6 mL: 6 mL | INTRA_ARTICULAR | @ 13:00:00 | NDC 00409427617

## 2023-09-07 MED ADMIN — triamcinolone acetonide (KENALOG-40) injection 40 mg: 40 mg | INTRA_ARTICULAR | @ 13:00:00 | NDC 70121116901

## 2023-09-07 NOTE — Progress Notes (Signed)
 Elaine [name]  06/06/1951   Chief Complaint   Patient presents with    Shoulder Pain     left        HISTORY OF PRESENT ILLNESS  Elaine [name] is a 72 y.o. female who presents today for evaluation of left shoulder pain.  Pain is a 2/10. Pain has been present for a few weeks. Patient was referred by Dr.Westcott. Her pain increases with brushing her hair, overhead movement, and reaching. She has limited ROM.    Has tried following treatments: Injections:No; Brace:No; Therapy:No; Cane/Crutch:No      Allergies   Allergen Reactions    Aspirin Other (See Comments)     GI distress,     Diphenhydramine Other (See Comments)     Muscle jerking    Pollen Extract      Other reaction(s): Not Reported This Time        Past Medical History:   Diagnosis Date    Anemia     Asthma     Colon polyp     DDD (degenerative disc disease) 02/15/2009    Depression 12/18/2011    DVT (deep venous thrombosis) (HCC) 10/23/2009    Left leg:  No DVT.      Essential hypertension, benign     History of blood transfusion     History of echocardiogram 03/20/2004    EF 70%.  Borderline DDfx.  No significant valvular pathology.    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  Neg EKG on pharm stress test.    Thyroid disease     hypothyroidism      Social History       Tobacco History       Smoking Status  Former Smoking Start Date  10/16/1977 Quit Date  04/25/2004  Average Packs/Day  1 pack/day for 27.0 years (27.0 ttl pk-yrs) Smoking Tobacco Type  Cigarettes from 10/16/1977 to 04/25/2004   Pack Year History     Packs/Day From To Years    0 04/25/2004  19.4    1 10/16/1977 04/25/2004 26.5    1   0.5      Passive Exposure  Never      Smokeless Tobacco Use  Never              Alcohol History       Alcohol Use Status  No              Drug Use       Drug Use Status  Never              Sexual Activity       Sexually Active  Not Currently                   Past Surgical History:  Procedure Laterality Date    BREAST BIOPSY      BREAST SURGERY      COLONOSCOPY N/A 04/25/2020    SCREENING COLONOSCOPY performed by Pattie Dale PARAS, MD at [facility] ENDOSCOPY    COLONOSCOPY  02-24-12    normal (Dr. Pattie)    COLONOSCOPY FLX DX W/COLLJ [facility] WHEN PFRMD  02-2007    +polyp(tubular adenoma); Dr Pattie    HERNIA REPAIR      3x    HYSTERECTOMY (CERVIX STATUS UNKNOWN)      NEUROLOGICAL SURGERY  03-16-2009    s/p ACD & Fusion (Dr.P.Gurtner)    TOTAL HIP ARTHROPLASTY Left 01/08/2023    LEFT HIP TOTAL ARTHROPLASTY LATERAL APPROACH performed by Marlyse Morene ORN, DO at [facility] MAIN OR    TUBAL LIGATION      UPPER GASTROINTESTINAL ENDOSCOPY N/A 06/12/2022    ENTEROSCOPY PUSH DIAGNOSTIC w/small intestinal bipolar cautery of multiple AVMs performed by Dale PARAS Pattie, MD at [facility] ENDOSCOPY      Family History   Problem Relation Age of Onset    Hypertension Sister         x3    Hypertension Mother     Cancer Mother     Heart Surgery Sister     Heart Disease Sister     Hypertension Paternal Uncle     Cancer Maternal Aunt         breast    Breast Cancer Maternal Aunt     Glaucoma Son     Heart Defect Son     Diabetes Paternal Uncle     Heart Attack Sister      Current Outpatient Medications   Medication Sig    gabapentin  (NEURONTIN ) 300 MG capsule TAKE 1 CAPSULE THREE TIMES DAILY. MAX DAILY AMOUNT: 900MG     levothyroxine  (SYNTHROID ) 88 MCG tablet Take 1 tablet by mouth daily    valsartan  (DIOVAN ) 160 MG tablet Take 1  tablet by mouth daily    diclofenac  sodium (VOLTAREN ) 1 % GEL Apply 4 g topically 4 times daily (Patient not taking: Reported on 07/02/2023)    montelukast  (SINGULAIR ) 10 MG tablet TAKE 1 TABLET EVERY DAY    vitamin D 25 MCG (1000 UT) CAPS Take 1 capsule by mouth daily (Patient not taking: Reported on 07/02/2023)    vitamin C (ASCORBIC ACID) 500 MG tablet Take 1 tablet by mouth daily    Multiple Vitamins-Minerals (MULTIVITAMIN ADULTS 50+ PO) Take 1 tablet by mouth daily    acetaminophen  (TYLENOL ) 500 MG tablet Take 2 tablets by mouth in the morning and 2 tablets at noon and 2 tablets in the evening.    amLODIPine  (NORVASC ) 5 MG tablet Take 1 tablet by mouth daily    Misc Natural Products (NEURIVA PO) Take 1 tablet by mouth in the morning and at bedtime    NONFORMULARY Take 1 tablet by mouth every evening Viviscal -1 tablet every evening for hair and scalp    potassium chloride  (KLOR-CON  M) 10 MEQ extended release tablet TAKE 1 TABLET EVERY DAY    atorvastatin  (LIPITOR) 40 MG tablet TAKE 1 TABLET EVERY DAY (NEED MD APPOINTMENT)    albuterol  sulfate HFA (PROVENTIL ;VENTOLIN ;PROAIR ) 108 (90 Base) MCG/ACT inhaler Inhale 2 puffs into the lungs every 4 hours as needed for Wheezing or Shortness of Breath    cyanocobalamin 1000 MCG tablet Take 1 tablet by mouth every evening (Patient not taking: Reported on 07/02/2023)    ZINC PO Take 1 tablet by mouth daily  iron dextran complex (INFED) 50 MG/ML injection Infuse 19.5 mLs intravenously Pt gets these every other month    latanoprost (XALATAN) 0.005 % ophthalmic solution Place 1 drop into both eyes nightly    lidocaine  (LIDODERM ) 5 % Place 1 patch onto the skin every 24 hours 12 hours on and 12 hours off     No current facility-administered medications for this visit.       REVIEW OF SYSTEM   Patient denies: Weight loss, Fever/Chills, HA, Visual changes, Fatigue, Chest pain, SOB, Abdominal pain, N/V/D/C, Blood in stool or urine, Edema.   Pertinent positive as above in HPI. All  others were negative    PHYSICAL EXAM:   Ht 1.575 m (5' 2)   Wt 67.6 kg (149 lb)   BMI 27.25 kg/m   The patient is a well-developed, well-nourished female   in no acute distress.  The patient is alert and oriented times three.  The patient is alert and oriented times three. Mood and affect are normal.  LYMPHATIC: lymph nodes are not enlarged and are within normal limits  SKIN: normal in color and non tender to palpation. There are no bruises or abrasions noted.   NEUROLOGICAL: Motor sensory exam is within normal limits. Reflexes are equal bilaterally. There is normal sensation to pinprick and light touch  MUSCULOSKELETAL:  Examination Left shoulder Right shoulder   Skin Intact Intact   AC joint tenderness - -   Biceps tenderness - -   Forward flexion/Elevation ROM 100 110   Active abduction ROM 100 110   Glenohumeral abduction 45 60   External rotation ROM 10 90   Internal rotation ROM 30 70   Apprehension - -   Jobe's Relocation - -   Jerk - -   Load and Shift - -   O'briens - -   Speeds - -   Impingement sign - -   Supraspinatus/Empty Can -, 5/5 -, 5/5   External Rotation Strength -, 5/5 -, 5/5   Lift Off/Belly Press -, 5/5 -, 5/5   Neurovascular Intact Intact          PROCEDURE: left shoulder Injection with Ultrasound Guidance    Indication:  left shoulder pain/swelling    After sterile prep, 6mL lidocaine  with 1mL 40mg  Kenalog  were injected into the left shoulder intraarticular under ultrasound guidance. Ultrasound images captured and scanned into patient's chart.        [facility]  OFFICE PROCEDURE PROGRESS NOTE        Chart reviewed for the following:  I, Catalina Bruch MD, have reviewed the History, Physical and updated the Allergic reactions for Elaine [name]     TIME OUT performed immediately prior to start of procedure:  I, Catalina Bruch MD have performed the following reviews on Elaine [name] prior to the start of the procedure:            * Patient  was identified by name and date of birth   * Agreement on procedure being performed was verified  * Risks and Benefits explained to the patient  * Procedure site verified and marked as necessary  * Patient was positioned for comfort  * Consent was signed and verified     Time: 4:41 PM    Date of procedure: 09/08/2023    Procedure performed by:  Catalina Bruch MD    Provider assisted by: (see medication administration)    How tolerated by patient: tolerated  the procedure well with no complications    Comments: none      IMAGING: XR of the left shoulder obtained in office on 09/07/2023   reviewed and read by Dr.Luciano: Marked degenerative changes in the glenohumeral joint with a large inferior humeral head spur    IMPRESSION:      ICD-10-CM    1. Left shoulder pain, unspecified chronicity  M25.512 [73030] Shoulder 2V or more      2. Glenohumeral arthritis, left  M19.012 AMB POC US  DRAIN/INJECT LARGE JOINT/BURSA     lidocaine  1 % injection 6 mL     triamcinolone  acetonide (KENALOG -40) injection 40 mg           PLAN:   1. Pt presents today with left shoulder pain secondary to glenohumeral arthritis.  Discussed options of treatment given the persistent problems we will proceed with an injection after sterile prepped, 6 and 1 kenalog  was injected into the left shoulder.  Risk factors include: High blood pressure  2. Yes cortisone injection indicated today  3. No Physical/Occupational Therapy indicated today  4. No diagnostic test indicated today   5. No durable medical equipment indicated today  6. No referral indicated today   7. No medications indicated today:   8. No Narcotic indicated today for short term acute pain secondary to glenohumeral arthritis. Patient given pain medication for short term acute pain relief. Goal is to treat patient according to above plan and to ultimately have patient off all pain medications once appropriate. If chronic pain management is required beyond what is expected for current  orthopedic problem, will refer patient to pain management. PMP was reviewed and will be reviewed with every medication refill request.     RTC PRN         By signing my name below, I Landis Pike, SCRIBE, attest that this documentation has been prepared under the direction and in the presence of Catalina Prier, MD  Electronically signed: Landis Pike, SCRIBE. 09/08/2023 2:03 PM EDT    I, Catalina Prier, MD, personally performed the services described in this documentation. I have authorized the scribe to complete the medical record entries input within this chart. I have reviewed the chart and agree that the record reflects my personal performance and is accurate and complete. [Electronically Signed: Catalina Prier, MD. 09/08/2023 2:03 PM EDT]

## 2023-09-09 ENCOUNTER — Encounter

## 2023-09-09 NOTE — Other (Signed)
 No acute findings on RUS. Nonobstructive kidney stones. Will review at follow up.

## 2023-09-22 DIAGNOSIS — C189 Malignant neoplasm of colon, unspecified: Secondary | ICD-10-CM | POA: Diagnosis not present

## 2023-09-22 DIAGNOSIS — I10 Essential (primary) hypertension: Secondary | ICD-10-CM | POA: Diagnosis not present

## 2023-09-22 DIAGNOSIS — E119 Type 2 diabetes mellitus without complications: Secondary | ICD-10-CM | POA: Diagnosis not present

## 2023-09-22 DIAGNOSIS — Z01818 Encounter for other preprocedural examination: Secondary | ICD-10-CM | POA: Diagnosis not present

## 2023-09-22 DIAGNOSIS — Z885 Allergy status to narcotic agent status: Secondary | ICD-10-CM | POA: Diagnosis not present

## 2023-09-22 DIAGNOSIS — Z7984 Long term (current) use of oral hypoglycemic drugs: Secondary | ICD-10-CM | POA: Diagnosis not present

## 2023-09-22 DIAGNOSIS — Z452 Encounter for adjustment and management of vascular access device: Secondary | ICD-10-CM | POA: Diagnosis not present

## 2023-09-22 DIAGNOSIS — Z7982 Long term (current) use of aspirin: Secondary | ICD-10-CM | POA: Diagnosis not present

## 2023-09-22 DIAGNOSIS — Z79899 Other long term (current) drug therapy: Secondary | ICD-10-CM | POA: Diagnosis not present

## 2023-09-22 DIAGNOSIS — C186 Malignant neoplasm of descending colon: Secondary | ICD-10-CM | POA: Diagnosis not present

## 2023-09-24 ENCOUNTER — Other Ambulatory Visit: Admit: 2023-09-24 | Discharge: 2023-09-24 | Payer: MEDICARE | Primary: Internal Medicine

## 2023-09-24 DIAGNOSIS — R7303 Prediabetes: Secondary | ICD-10-CM

## 2023-09-25 LAB — COMPREHENSIVE METABOLIC PANEL
ALT: 24 [IU]/L (ref 0–32)
AST: 18 [IU]/L (ref 0–40)
Albumin: 4 g/dL (ref 3.8–4.8)
Alkaline Phosphatase: 87 [IU]/L (ref 44–121)
BUN/Creatinine Ratio: 9 — ABNORMAL LOW (ref 12–28)
BUN: 7 mg/dL — ABNORMAL LOW (ref 8–27)
CO2: 24 mmol/L (ref 20–29)
Calcium: 9.7 mg/dL (ref 8.7–10.3)
Chloride: 102 mmol/L (ref 96–106)
Creatinine: 0.8 mg/dL (ref 0.57–1.00)
Est, Glom Filt Rate: 78 mL/min/{1.73_m2} (ref >59)
Globulin, Total: 2.6 g/dL (ref 1.5–4.5)
Glucose: 97 mg/dL (ref 70–99)
Potassium: 3.8 mmol/L (ref 3.5–5.2)
Sodium: 141 mmol/L (ref 134–144)
Total Bilirubin: 0.4 mg/dL (ref 0.0–1.2)
Total Protein: 6.6 g/dL (ref 6.0–8.5)

## 2023-09-25 LAB — LIPID PANEL
Cholesterol, Total: 187 mg/dL (ref 100–199)
HDL: 71 mg/dL (ref >39)
LDL Cholesterol: 99 mg/dL (ref 0–99)
Triglycerides: 98 mg/dL (ref 0–149)
VLDL Cholesterol Calculated: 17 mg/dL (ref 5–40)

## 2023-09-25 LAB — CBC/DIFF AMBIGUOUS DEFAULT
Basophils %: 1 %
Basophils Absolute: 0 10*3/uL (ref 0.0–0.2)
Eosinophils %: 1 %
Eosinophils Absolute: 0.1 10*3/uL (ref 0.0–0.4)
Hematocrit: 35 % (ref 34.0–46.6)
Hemoglobin: 11 g/dL — ABNORMAL LOW (ref 11.1–15.9)
Immature Grans (Abs): 0 10*3/uL (ref 0.0–0.1)
Immature Granulocytes %: 1 %
Lymphocytes %: 14 %
Lymphocytes Absolute: 0.9 10*3/uL (ref 0.7–3.1)
MCH: 26.2 pg — ABNORMAL LOW (ref 26.6–33.0)
MCHC: 31.4 g/dL — ABNORMAL LOW (ref 31.5–35.7)
MCV: 83 fL (ref 79–97)
Monocytes %: 9 %
Monocytes Absolute: 0.5 10*3/uL (ref 0.1–0.9)
Neutrophils %: 74 %
Neutrophils Absolute: 4.7 10*3/uL (ref 1.4–7.0)
Platelets: 284 10*3/uL (ref 150–450)
RBC: 4.2 x10E6/uL (ref 3.77–5.28)
RDW: 16.1 % — ABNORMAL HIGH (ref 11.7–15.4)
WBC: 6.2 10*3/uL (ref 3.4–10.8)

## 2023-09-25 LAB — HEMOGLOBIN A1C: Hemoglobin A1C: 5.6 % (ref 4.8–5.6)

## 2023-09-25 LAB — TSH + FREE T4 PANEL
T4 Free: 0.94 ng/dL (ref 0.82–1.77)
TSH: 13.7 u[IU]/mL — ABNORMAL HIGH (ref 0.450–4.500)

## 2023-09-25 LAB — VITAMIN D 25 HYDROXY: Vit D, 25-Hydroxy: 61.8 ng/mL (ref 30.0–100.0)

## 2023-09-25 LAB — SPECIMEN STATUS REPORT

## 2023-10-01 ENCOUNTER — Ambulatory Visit: Admit: 2023-10-01 | Discharge: 2023-10-01 | Payer: MEDICARE | Attending: Internal Medicine | Primary: Internal Medicine

## 2023-10-01 DIAGNOSIS — I10 Essential (primary) hypertension: Secondary | ICD-10-CM

## 2023-10-01 NOTE — Progress Notes (Unsigned)
Elaine [name] is a 72 y.o.Elaine [name]  female and presents with Hypertension, Cholesterol Problem, Thyroid Problem (/), and Vitamin D Deficiency      SUBJECTIVE:  [name] HTN is uncontrolled on   Norvasc 5  mg.  [name] high cholesterol has been borderline controlled on Lipitor 40 mg. Pt  denies any side effects like myalgias. Compliance with medications has been an issue.  Pt has been controlling her prediabetes with diet and weight loss.  Patient daughter has been helping her to be more compliant with taking her medications.  They will have updated blood work done before next OV.     Thyroid Review:  Patient is seen for followup of hypothyroidism.   Thyroid ROS: denies fatigue, weight changes, heat/cold intolerance, bowel/skin changes or CVS symptoms. 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  Patient's TSH level is too low on Synthroid 100 mcg daily so we will decrease it to 88 mcg daily    Patient has history of vitamin D deficiency and should be on vitamin D about 2000 units/day.  Will get updated blood work  Patient has sleep apnea and is using a CPAP machine and followed by sleep specialist    She continues to have fungal infection in her toenails which is slowly improving with Lamisil.     Patient has asthma that is controlled on Singulair and  trelegy Ellipta.  Patient is followed by pulmonary who also sees her for sleep apnea.      Patient has chronic diastolic heart failure which is stable and followed by cardiology    Patient has an atrophic right kidney which may have been going on for several years if not since birth.  Duplex renal ultrasound shows Eleveated velocity identified in the mid left renal artery in the presence of tortousity. No hemodynamically significant stenosis involving the right renal artery.  Patient is followed by nephrology.  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  She has had EGD/colonoscopy and pill endoscopy in the past.  Patient unable to use aspirin due to this reason.    Patient has a history of Chiari malformation type I and has been referred to neurology.    Respiratory ROS: negative for - shortness of breath  Cardiovascular ROS: negative for - chest pain      Current Outpatient Medications   Medication Sig    spironolactone (ALDACTONE) 25 MG tablet Take 1 tablet by mouth daily    gabapentin (NEURONTIN) 300 MG capsule TAKE 1 CAPSULE THREE TIMES DAILY. MAX DAILY AMOUNT: 900MG     levothyroxine (SYNTHROID) 88 MCG tablet Take 1 tablet by mouth daily    valsartan (DIOVAN) 160 MG tablet Take 1 tablet by mouth daily    montelukast (SINGULAIR) 10 MG tablet TAKE 1 TABLET EVERY DAY    vitamin C (ASCORBIC ACID) 500 MG tablet Take 1 tablet by mouth daily    Multiple Vitamins-Minerals (MULTIVITAMIN ADULTS 50+ PO) Take 1 tablet by mouth daily    amLODIPine (NORVASC) 5 MG tablet Take 1 tablet by mouth daily    Misc Natural Products (NEURIVA PO) Take 1 tablet by mouth in the morning and at bedtime    NONFORMULARY Take 1 tablet by mouth every evening Viviscal -1 tablet every evening for  hair and scalp    potassium chloride (KLOR-CON M) 10 MEQ extended release tablet TAKE 1 TABLET EVERY DAY    atorvastatin (LIPITOR) 40 MG tablet TAKE 1 TABLET EVERY DAY (NEED MD APPOINTMENT)    albuterol sulfate HFA (PROVENTIL;VENTOLIN;PROAIR) 108 (90 Base) MCG/ACT inhaler Inhale 2 puffs into the lungs every 4 hours as needed for Wheezing or Shortness of Breath    ZINC PO Take 1 tablet by mouth daily    iron dextran complex (INFED) 50 MG/ML injection Infuse 19.5 mLs intravenously Pt gets these every other month    latanoprost (XALATAN) 0.005 % ophthalmic solution Place 1 drop into both eyes nightly    lidocaine (LIDODERM) 5 % Place 1 patch onto the skin every 24 hours 12 hours on  and 12 hours off    diclofenac sodium (VOLTAREN) 1 % GEL Apply 4 g topically 4 times daily (Patient not taking: Reported on 07/02/2023)    vitamin D 25 MCG (1000 UT) CAPS Take 1 capsule by mouth daily (Patient not taking: Reported on 07/02/2023)    acetaminophen (TYLENOL) 500 MG tablet Take 2 tablets by mouth in the morning and 2 tablets at noon and 2 tablets in the evening.    cyanocobalamin 1000 MCG tablet Take 1 tablet by mouth every evening (Patient not taking: Reported on 07/02/2023)     No current facility-administered medications for this visit.           OBJECTIVE:  alert, well appearing, and in no distress    Visit Vitals  BP (!) 124/56 (Site: Right Upper Arm, Position: Sitting, Cuff Size: Large Adult)   Pulse 58   Temp 98.6 F (37 C) (Temporal)   Resp 20   Ht 1.575 m (5\' 2" )   Wt 65.8 kg (145 lb)   SpO2 95%   BMI 26.52 kg/m          well developed and well nourished  Chest - clear to auscultation, no wheezes, rales or rhonchi, symmetric air entry  Heart - normal rate, regular rhythm, normal S1, S2, no murmurs, rubs, clicks or gallops  Extremities - peripheral pulses normal, no pedal edema, no clubbing or cyanosis    CMP:    Lab Results   Component Value Date/Time    NA 141 09/24/2023 12:00 AM    K 3.8 09/24/2023 12:00 AM    CL 102 09/24/2023 12:00 AM    CO2 24 09/24/2023 12:00 AM    BUN 7 09/24/2023 12:00 AM    CREATININE 0.80 09/24/2023 12:00 AM    GFRAA >60 05/26/2021 08:05 AM    AGRATIO 1.7 03/09/2023 12:00 AM    LABGLOM 78 09/24/2023 12:00 AM    LABGLOM 96 03/09/2023 12:00 AM    GLUCOSE 97 09/24/2023 12:00 AM    LABALBU 4.1 03/09/2023 12:00 AM    CALCIUM 9.7 09/24/2023 12:00 AM    BILITOT 0.4 09/24/2023 12:00 AM    ALKPHOS 87 09/24/2023 12:00 AM    AST 18 09/24/2023 12:00 AM    ALT 24 09/24/2023 12:00 AM     HgBA1c:    Lab Results   Component Value Date/Time    LABA1C 5.6 09/24/2023 12:00 AM     FLP:    Lab Results   Component Value Date/Time    TRIG 98 09/24/2023 12:00 AM    HDL 71 09/24/2023 12:00 AM      TSH:    Lab Results   Component Value Date/Time    TSH 13.700 09/24/2023 12:00 AM  TSH 0.02 06/11/2023 01:29 PM         Discussed the patient's BMI with her.  The BMI follow up plan is as follows: I have counseled this patient on diet and exercise regimens.        Assessment/Plan    ICD-10-CM    1. Essential hypertension  I10 Uncontrolled on Norvasc 5 mg daily will add valsartan (DIOVAN) 160 MG tablet     Comprehensive Metabolic Panel      2. High cholesterol  E78.00 Control uncertain on Lipitor 40 mg daily patient to have updated lipid Panel      3. Postprocedural hypothyroidism  E89.0 Patient's levels too low on Synthroid 100 mcg which we will discontinue and change to levothyroxine (SYNTHROID) 88 MCG tablet     TSH + Free T4 Panel      4. Vitamin D deficiency  E55.9 Status unknown patient should be on vitamin D 2000 units/day and will check updated Vitamin D 25 Hydroxy      5. Prediabetes  R73.03 Controlled currently with diet and weight loss Hemoglobin A1C      6. Iron deficiency anemia due to chronic blood loss  D50.0 Patient managed by hematology where she has had iron infusions.  She has had negative GI workup in the past except for the AVMs CBC with Auto Differential           Follow-up and Dispositions    Ms.No follow-ups on file.             Reviewed plan of care. Patient has provided input and agrees with goals.

## 2023-10-01 NOTE — Progress Notes (Signed)
Elaine [name] presents today for   Chief Complaint   Patient presents with    Hypertension    Cholesterol Problem    Thyroid Problem     3 month follow up            "Have you been to the ER, urgent care clinic since your last visit?  Hospitalized since your last visit?"    NO    "Have you seen or consulted any other health care providers outside of [facility] since your last visit?"    NO

## 2023-10-04 MED ORDER — ATORVASTATIN CALCIUM 40 MG PO TABS
40 | ORAL_TABLET | ORAL | 3 refills | Status: DC
Start: 2023-10-04 — End: 2024-07-27

## 2023-10-04 NOTE — Telephone Encounter (Signed)
PCP: Marvia Pickles, MD    LAST OFFICE VISIT: 10/01/2023    LAST REFILL PER CHART:  Medication:atorvastatin (LIPITOR) 40 MG tablet   Ordered On:09/16/2022  Instructions:TAKE 1 TABLET EVERY DAY   Dispense:90 tablets  Refills:3      Future Appointments   Date Time Provider Department Center   12/20/2023  1:00 PM Reather Converse, PA-C [facility] Sched   01/25/2024  3:00 PM [facility] LAB VISIT [facility] DE[facility]   02/01/2024  3:20 PM Hunte, Toniann Fail, MD [facility]

## 2023-10-14 DIAGNOSIS — Z1231 Encounter for screening mammogram for malignant neoplasm of breast: Secondary | ICD-10-CM | POA: Diagnosis not present

## 2023-10-14 DIAGNOSIS — Z0000 Encounter for general adult medical examination without abnormal findings: Secondary | ICD-10-CM | POA: Diagnosis not present

## 2023-10-14 DIAGNOSIS — E119 Type 2 diabetes mellitus without complications: Secondary | ICD-10-CM | POA: Diagnosis not present

## 2023-10-14 DIAGNOSIS — I10 Essential (primary) hypertension: Secondary | ICD-10-CM | POA: Diagnosis not present

## 2023-10-14 DIAGNOSIS — M109 Gout, unspecified: Secondary | ICD-10-CM | POA: Diagnosis not present

## 2023-10-14 DIAGNOSIS — C182 Malignant neoplasm of ascending colon: Secondary | ICD-10-CM | POA: Diagnosis not present

## 2023-10-14 DIAGNOSIS — Z23 Encounter for immunization: Secondary | ICD-10-CM | POA: Diagnosis not present

## 2023-10-14 DIAGNOSIS — E785 Hyperlipidemia, unspecified: Secondary | ICD-10-CM | POA: Diagnosis not present

## 2023-10-14 DIAGNOSIS — A048 Other specified bacterial intestinal infections: Secondary | ICD-10-CM | POA: Diagnosis not present

## 2023-10-17 DIAGNOSIS — C189 Malignant neoplasm of colon, unspecified: Secondary | ICD-10-CM | POA: Diagnosis not present

## 2023-10-17 DIAGNOSIS — D649 Anemia, unspecified: Secondary | ICD-10-CM | POA: Diagnosis not present

## 2023-10-17 DIAGNOSIS — F419 Anxiety disorder, unspecified: Secondary | ICD-10-CM | POA: Diagnosis not present

## 2023-10-17 DIAGNOSIS — R42 Dizziness and giddiness: Secondary | ICD-10-CM | POA: Diagnosis not present

## 2023-10-17 DIAGNOSIS — R059 Cough, unspecified: Secondary | ICD-10-CM | POA: Diagnosis not present

## 2023-10-17 DIAGNOSIS — R079 Chest pain, unspecified: Secondary | ICD-10-CM | POA: Diagnosis not present

## 2023-10-17 DIAGNOSIS — Z885 Allergy status to narcotic agent status: Secondary | ICD-10-CM | POA: Diagnosis not present

## 2023-10-17 DIAGNOSIS — Z20822 Contact with and (suspected) exposure to covid-19: Secondary | ICD-10-CM | POA: Diagnosis not present

## 2023-10-17 DIAGNOSIS — E119 Type 2 diabetes mellitus without complications: Secondary | ICD-10-CM | POA: Diagnosis not present

## 2023-10-17 DIAGNOSIS — I10 Essential (primary) hypertension: Secondary | ICD-10-CM | POA: Diagnosis not present

## 2023-10-20 DIAGNOSIS — F419 Anxiety disorder, unspecified: Secondary | ICD-10-CM | POA: Diagnosis not present

## 2023-11-10 ENCOUNTER — Encounter: Admit: 2023-11-10 | Payer: MEDICARE | Admitting: Internal Medicine | Primary: Internal Medicine

## 2023-11-10 VITALS — BP 153/80 | HR 87 | Temp 97.30000°F | Resp 16 | Ht 62.0 in | Wt 153.0 lb

## 2023-11-10 DIAGNOSIS — M533 Sacrococcygeal disorders, not elsewhere classified: Secondary | ICD-10-CM

## 2023-11-10 NOTE — Progress Notes (Signed)
 Elaine [name] (DOB:  August 13, 1951) is a 72 y.o. female established patient, here for evaluation of the following chief complaint(s):  Fall (Tail bone pain x 2 weeks S/P fall)      Assessment & Plan   ASSESSMENT/PLAN:    ICD-10-CM    1. Tail bone pain  M53.

## 2023-11-10 NOTE — Progress Notes (Signed)
 Elaine [name] presents today for   Chief Complaint   Patient presents with    Fall     Tail bone pain x 2 weeks S/P fall           "Have you been to the ER, urgent care clinic since your last visit?  Hospitalized since your last visit?"    NO    "Have y

## 2023-11-16 DIAGNOSIS — F419 Anxiety disorder, unspecified: Secondary | ICD-10-CM | POA: Diagnosis not present

## 2023-12-10 ENCOUNTER — Encounter: Admit: 2023-12-10 | Admitting: Nephrology

## 2023-12-10 ENCOUNTER — Inpatient Hospital Stay
Admit: 2023-12-10 | Disposition: A | Discharge status: Home / Self Care | Payer: Medicare (Managed Care) | Source: Ambulatory Visit | Admitting: Nephrology | Primary: Internal Medicine

## 2023-12-10 DIAGNOSIS — I10 Essential (primary) hypertension: Secondary | ICD-10-CM

## 2023-12-10 LAB — T4, FREE: T4 Free: 0.5 ng/dL — ABNORMAL LOW (ref 0.7–1.5)

## 2023-12-13 LAB — ALDOSTERONE: Aldosterone: 2.5 ng/dL (ref 0.0–30.0)

## 2023-12-16 DIAGNOSIS — R195 Other fecal abnormalities: Secondary | ICD-10-CM | POA: Diagnosis not present

## 2023-12-16 DIAGNOSIS — K625 Hemorrhage of anus and rectum: Secondary | ICD-10-CM | POA: Diagnosis not present

## 2023-12-16 DIAGNOSIS — I2690 Septic pulmonary embolism without acute cor pulmonale: Secondary | ICD-10-CM | POA: Diagnosis not present

## 2023-12-16 DIAGNOSIS — Z7984 Long term (current) use of oral hypoglycemic drugs: Secondary | ICD-10-CM | POA: Diagnosis not present

## 2023-12-16 DIAGNOSIS — C786 Secondary malignant neoplasm of retroperitoneum and peritoneum: Secondary | ICD-10-CM | POA: Diagnosis present

## 2023-12-16 DIAGNOSIS — N179 Acute kidney failure, unspecified: Secondary | ICD-10-CM | POA: Diagnosis not present

## 2023-12-16 DIAGNOSIS — A400 Sepsis due to streptococcus, group A: Secondary | ICD-10-CM | POA: Diagnosis not present

## 2023-12-16 DIAGNOSIS — K922 Gastrointestinal hemorrhage, unspecified: Secondary | ICD-10-CM | POA: Diagnosis not present

## 2023-12-16 DIAGNOSIS — R918 Other nonspecific abnormal finding of lung field: Secondary | ICD-10-CM | POA: Diagnosis not present

## 2023-12-16 DIAGNOSIS — D509 Iron deficiency anemia, unspecified: Secondary | ICD-10-CM | POA: Diagnosis not present

## 2023-12-16 DIAGNOSIS — R55 Syncope and collapse: Secondary | ICD-10-CM | POA: Diagnosis not present

## 2023-12-16 DIAGNOSIS — J020 Streptococcal pharyngitis: Secondary | ICD-10-CM | POA: Diagnosis present

## 2023-12-16 DIAGNOSIS — K7689 Other specified diseases of liver: Secondary | ICD-10-CM | POA: Diagnosis not present

## 2023-12-16 DIAGNOSIS — B950 Streptococcus, group A, as the cause of diseases classified elsewhere: Secondary | ICD-10-CM | POA: Diagnosis not present

## 2023-12-16 DIAGNOSIS — R0602 Shortness of breath: Secondary | ICD-10-CM | POA: Diagnosis not present

## 2023-12-16 DIAGNOSIS — I2510 Atherosclerotic heart disease of native coronary artery without angina pectoris: Secondary | ICD-10-CM | POA: Diagnosis present

## 2023-12-16 DIAGNOSIS — I2489 Other forms of acute ischemic heart disease: Secondary | ICD-10-CM | POA: Diagnosis not present

## 2023-12-16 DIAGNOSIS — C787 Secondary malignant neoplasm of liver and intrahepatic bile duct: Secondary | ICD-10-CM | POA: Diagnosis present

## 2023-12-16 DIAGNOSIS — I82409 Acute embolism and thrombosis of unspecified deep veins of unspecified lower extremity: Secondary | ICD-10-CM | POA: Diagnosis not present

## 2023-12-16 DIAGNOSIS — K6389 Other specified diseases of intestine: Secondary | ICD-10-CM | POA: Diagnosis not present

## 2023-12-16 DIAGNOSIS — I82451 Acute embolism and thrombosis of right peroneal vein: Secondary | ICD-10-CM | POA: Diagnosis not present

## 2023-12-16 DIAGNOSIS — I700 Atherosclerosis of aorta: Secondary | ICD-10-CM | POA: Diagnosis not present

## 2023-12-16 DIAGNOSIS — M109 Gout, unspecified: Secondary | ICD-10-CM | POA: Diagnosis not present

## 2023-12-16 DIAGNOSIS — C182 Malignant neoplasm of ascending colon: Secondary | ICD-10-CM | POA: Diagnosis present

## 2023-12-16 DIAGNOSIS — I82463 Acute embolism and thrombosis of calf muscular vein, bilateral: Secondary | ICD-10-CM | POA: Diagnosis not present

## 2023-12-16 DIAGNOSIS — I82442 Acute embolism and thrombosis of left tibial vein: Secondary | ICD-10-CM | POA: Diagnosis not present

## 2023-12-16 DIAGNOSIS — K5901 Slow transit constipation: Secondary | ICD-10-CM | POA: Diagnosis not present

## 2023-12-16 DIAGNOSIS — I82493 Acute embolism and thrombosis of other specified deep vein of lower extremity, bilateral: Secondary | ICD-10-CM | POA: Diagnosis not present

## 2023-12-16 DIAGNOSIS — D630 Anemia in neoplastic disease: Secondary | ICD-10-CM | POA: Diagnosis present

## 2023-12-16 DIAGNOSIS — E7800 Pure hypercholesterolemia, unspecified: Secondary | ICD-10-CM | POA: Diagnosis not present

## 2023-12-16 DIAGNOSIS — D508 Other iron deficiency anemias: Secondary | ICD-10-CM | POA: Diagnosis not present

## 2023-12-16 DIAGNOSIS — I82431 Acute embolism and thrombosis of right popliteal vein: Secondary | ICD-10-CM | POA: Diagnosis present

## 2023-12-16 DIAGNOSIS — E785 Hyperlipidemia, unspecified: Secondary | ICD-10-CM | POA: Diagnosis not present

## 2023-12-16 DIAGNOSIS — D6859 Other primary thrombophilia: Secondary | ICD-10-CM | POA: Diagnosis present

## 2023-12-16 DIAGNOSIS — E119 Type 2 diabetes mellitus without complications: Secondary | ICD-10-CM | POA: Diagnosis not present

## 2023-12-16 DIAGNOSIS — R7989 Other specified abnormal findings of blood chemistry: Secondary | ICD-10-CM | POA: Diagnosis not present

## 2023-12-16 DIAGNOSIS — E1122 Type 2 diabetes mellitus with diabetic chronic kidney disease: Secondary | ICD-10-CM | POA: Diagnosis not present

## 2023-12-16 DIAGNOSIS — D251 Intramural leiomyoma of uterus: Secondary | ICD-10-CM | POA: Diagnosis not present

## 2023-12-16 DIAGNOSIS — F419 Anxiety disorder, unspecified: Secondary | ICD-10-CM | POA: Diagnosis present

## 2023-12-16 DIAGNOSIS — J189 Pneumonia, unspecified organism: Secondary | ICD-10-CM | POA: Diagnosis present

## 2023-12-16 DIAGNOSIS — I2694 Multiple subsegmental pulmonary emboli without acute cor pulmonale: Secondary | ICD-10-CM | POA: Diagnosis present

## 2023-12-16 DIAGNOSIS — D649 Anemia, unspecified: Secondary | ICD-10-CM | POA: Diagnosis not present

## 2023-12-16 DIAGNOSIS — I2699 Other pulmonary embolism without acute cor pulmonale: Secondary | ICD-10-CM | POA: Diagnosis not present

## 2023-12-16 DIAGNOSIS — Z515 Encounter for palliative care: Secondary | ICD-10-CM | POA: Diagnosis not present

## 2023-12-16 DIAGNOSIS — D500 Iron deficiency anemia secondary to blood loss (chronic): Secondary | ICD-10-CM | POA: Diagnosis not present

## 2023-12-16 DIAGNOSIS — R933 Abnormal findings on diagnostic imaging of other parts of digestive tract: Secondary | ICD-10-CM | POA: Diagnosis not present

## 2023-12-16 DIAGNOSIS — Z7189 Other specified counseling: Secondary | ICD-10-CM | POA: Diagnosis not present

## 2023-12-16 DIAGNOSIS — I129 Hypertensive chronic kidney disease with stage 1 through stage 4 chronic kidney disease, or unspecified chronic kidney disease: Secondary | ICD-10-CM | POA: Diagnosis not present

## 2023-12-16 DIAGNOSIS — I82443 Acute embolism and thrombosis of tibial vein, bilateral: Secondary | ICD-10-CM | POA: Diagnosis present

## 2023-12-16 DIAGNOSIS — K219 Gastro-esophageal reflux disease without esophagitis: Secondary | ICD-10-CM | POA: Diagnosis present

## 2023-12-16 DIAGNOSIS — I824Z3 Acute embolism and thrombosis of unspecified deep veins of distal lower extremity, bilateral: Secondary | ICD-10-CM | POA: Diagnosis not present

## 2023-12-16 DIAGNOSIS — N1832 Chronic kidney disease, stage 3b: Secondary | ICD-10-CM | POA: Diagnosis not present

## 2023-12-16 DIAGNOSIS — I10 Essential (primary) hypertension: Secondary | ICD-10-CM | POA: Diagnosis not present

## 2023-12-16 DIAGNOSIS — D631 Anemia in chronic kidney disease: Secondary | ICD-10-CM | POA: Diagnosis not present

## 2023-12-16 DIAGNOSIS — R079 Chest pain, unspecified: Secondary | ICD-10-CM | POA: Diagnosis not present

## 2023-12-16 DIAGNOSIS — C183 Malignant neoplasm of hepatic flexure: Secondary | ICD-10-CM | POA: Diagnosis not present

## 2023-12-16 DIAGNOSIS — M1000 Idiopathic gout, unspecified site: Secondary | ICD-10-CM | POA: Diagnosis not present

## 2023-12-17 DIAGNOSIS — I824Z3 Acute embolism and thrombosis of unspecified deep veins of distal lower extremity, bilateral: Secondary | ICD-10-CM | POA: Diagnosis not present

## 2023-12-17 DIAGNOSIS — D509 Iron deficiency anemia, unspecified: Secondary | ICD-10-CM | POA: Diagnosis present

## 2023-12-17 DIAGNOSIS — Z7984 Long term (current) use of oral hypoglycemic drugs: Secondary | ICD-10-CM | POA: Diagnosis not present

## 2023-12-17 DIAGNOSIS — B950 Streptococcus, group A, as the cause of diseases classified elsewhere: Secondary | ICD-10-CM | POA: Diagnosis not present

## 2023-12-17 DIAGNOSIS — R918 Other nonspecific abnormal finding of lung field: Secondary | ICD-10-CM | POA: Diagnosis not present

## 2023-12-17 DIAGNOSIS — D251 Intramural leiomyoma of uterus: Secondary | ICD-10-CM | POA: Diagnosis not present

## 2023-12-17 DIAGNOSIS — A400 Sepsis due to streptococcus, group A: Secondary | ICD-10-CM | POA: Diagnosis not present

## 2023-12-17 DIAGNOSIS — D631 Anemia in chronic kidney disease: Secondary | ICD-10-CM | POA: Diagnosis present

## 2023-12-17 DIAGNOSIS — M1000 Idiopathic gout, unspecified site: Secondary | ICD-10-CM | POA: Diagnosis not present

## 2023-12-17 DIAGNOSIS — C183 Malignant neoplasm of hepatic flexure: Secondary | ICD-10-CM | POA: Diagnosis not present

## 2023-12-17 DIAGNOSIS — F419 Anxiety disorder, unspecified: Secondary | ICD-10-CM | POA: Diagnosis present

## 2023-12-17 DIAGNOSIS — K922 Gastrointestinal hemorrhage, unspecified: Secondary | ICD-10-CM | POA: Diagnosis not present

## 2023-12-17 DIAGNOSIS — N1832 Chronic kidney disease, stage 3b: Secondary | ICD-10-CM | POA: Diagnosis present

## 2023-12-17 DIAGNOSIS — K625 Hemorrhage of anus and rectum: Secondary | ICD-10-CM | POA: Diagnosis not present

## 2023-12-17 DIAGNOSIS — I82442 Acute embolism and thrombosis of left tibial vein: Secondary | ICD-10-CM | POA: Diagnosis not present

## 2023-12-17 DIAGNOSIS — I10 Essential (primary) hypertension: Secondary | ICD-10-CM | POA: Diagnosis not present

## 2023-12-17 DIAGNOSIS — I82493 Acute embolism and thrombosis of other specified deep vein of lower extremity, bilateral: Secondary | ICD-10-CM | POA: Diagnosis not present

## 2023-12-17 DIAGNOSIS — Z7189 Other specified counseling: Secondary | ICD-10-CM | POA: Diagnosis not present

## 2023-12-17 DIAGNOSIS — E119 Type 2 diabetes mellitus without complications: Secondary | ICD-10-CM | POA: Diagnosis not present

## 2023-12-17 DIAGNOSIS — I2690 Septic pulmonary embolism without acute cor pulmonale: Secondary | ICD-10-CM | POA: Diagnosis not present

## 2023-12-17 DIAGNOSIS — J189 Pneumonia, unspecified organism: Secondary | ICD-10-CM | POA: Diagnosis present

## 2023-12-17 DIAGNOSIS — I2510 Atherosclerotic heart disease of native coronary artery without angina pectoris: Secondary | ICD-10-CM | POA: Diagnosis present

## 2023-12-17 DIAGNOSIS — I82431 Acute embolism and thrombosis of right popliteal vein: Secondary | ICD-10-CM | POA: Diagnosis present

## 2023-12-17 DIAGNOSIS — R7989 Other specified abnormal findings of blood chemistry: Secondary | ICD-10-CM | POA: Diagnosis not present

## 2023-12-17 DIAGNOSIS — I82463 Acute embolism and thrombosis of calf muscular vein, bilateral: Secondary | ICD-10-CM | POA: Diagnosis present

## 2023-12-17 DIAGNOSIS — R933 Abnormal findings on diagnostic imaging of other parts of digestive tract: Secondary | ICD-10-CM | POA: Diagnosis not present

## 2023-12-17 DIAGNOSIS — C182 Malignant neoplasm of ascending colon: Secondary | ICD-10-CM | POA: Diagnosis present

## 2023-12-17 DIAGNOSIS — E1122 Type 2 diabetes mellitus with diabetic chronic kidney disease: Secondary | ICD-10-CM | POA: Diagnosis present

## 2023-12-17 DIAGNOSIS — C786 Secondary malignant neoplasm of retroperitoneum and peritoneum: Secondary | ICD-10-CM | POA: Diagnosis present

## 2023-12-17 DIAGNOSIS — R195 Other fecal abnormalities: Secondary | ICD-10-CM | POA: Diagnosis not present

## 2023-12-17 DIAGNOSIS — I700 Atherosclerosis of aorta: Secondary | ICD-10-CM | POA: Diagnosis not present

## 2023-12-17 DIAGNOSIS — I2694 Multiple subsegmental pulmonary emboli without acute cor pulmonale: Secondary | ICD-10-CM | POA: Diagnosis present

## 2023-12-17 DIAGNOSIS — K5901 Slow transit constipation: Secondary | ICD-10-CM | POA: Diagnosis present

## 2023-12-17 DIAGNOSIS — D6859 Other primary thrombophilia: Secondary | ICD-10-CM | POA: Diagnosis present

## 2023-12-17 DIAGNOSIS — I82409 Acute embolism and thrombosis of unspecified deep veins of unspecified lower extremity: Secondary | ICD-10-CM | POA: Diagnosis not present

## 2023-12-17 DIAGNOSIS — K6389 Other specified diseases of intestine: Secondary | ICD-10-CM | POA: Diagnosis not present

## 2023-12-17 DIAGNOSIS — N179 Acute kidney failure, unspecified: Secondary | ICD-10-CM | POA: Diagnosis not present

## 2023-12-17 DIAGNOSIS — I2489 Other forms of acute ischemic heart disease: Secondary | ICD-10-CM | POA: Diagnosis not present

## 2023-12-17 DIAGNOSIS — C787 Secondary malignant neoplasm of liver and intrahepatic bile duct: Secondary | ICD-10-CM | POA: Diagnosis present

## 2023-12-17 DIAGNOSIS — R55 Syncope and collapse: Secondary | ICD-10-CM | POA: Diagnosis not present

## 2023-12-17 DIAGNOSIS — I82443 Acute embolism and thrombosis of tibial vein, bilateral: Secondary | ICD-10-CM | POA: Diagnosis present

## 2023-12-17 DIAGNOSIS — Z515 Encounter for palliative care: Secondary | ICD-10-CM | POA: Diagnosis not present

## 2023-12-17 DIAGNOSIS — R079 Chest pain, unspecified: Secondary | ICD-10-CM | POA: Diagnosis not present

## 2023-12-17 DIAGNOSIS — M109 Gout, unspecified: Secondary | ICD-10-CM | POA: Diagnosis not present

## 2023-12-17 DIAGNOSIS — D630 Anemia in neoplastic disease: Secondary | ICD-10-CM | POA: Diagnosis present

## 2023-12-17 DIAGNOSIS — D508 Other iron deficiency anemias: Secondary | ICD-10-CM | POA: Diagnosis not present

## 2023-12-17 DIAGNOSIS — K7689 Other specified diseases of liver: Secondary | ICD-10-CM | POA: Diagnosis not present

## 2023-12-17 DIAGNOSIS — I2699 Other pulmonary embolism without acute cor pulmonale: Secondary | ICD-10-CM | POA: Diagnosis not present

## 2023-12-17 DIAGNOSIS — D649 Anemia, unspecified: Secondary | ICD-10-CM | POA: Diagnosis not present

## 2023-12-17 DIAGNOSIS — I129 Hypertensive chronic kidney disease with stage 1 through stage 4 chronic kidney disease, or unspecified chronic kidney disease: Secondary | ICD-10-CM | POA: Diagnosis present

## 2023-12-17 DIAGNOSIS — D500 Iron deficiency anemia secondary to blood loss (chronic): Secondary | ICD-10-CM | POA: Diagnosis not present

## 2023-12-17 DIAGNOSIS — K219 Gastro-esophageal reflux disease without esophagitis: Secondary | ICD-10-CM | POA: Diagnosis present

## 2023-12-17 DIAGNOSIS — J020 Streptococcal pharyngitis: Secondary | ICD-10-CM | POA: Diagnosis present

## 2023-12-17 DIAGNOSIS — E785 Hyperlipidemia, unspecified: Secondary | ICD-10-CM | POA: Diagnosis not present

## 2023-12-17 DIAGNOSIS — E7800 Pure hypercholesterolemia, unspecified: Secondary | ICD-10-CM | POA: Diagnosis not present

## 2023-12-17 DIAGNOSIS — I82451 Acute embolism and thrombosis of right peroneal vein: Secondary | ICD-10-CM | POA: Diagnosis not present

## 2023-12-18 DIAGNOSIS — E785 Hyperlipidemia, unspecified: Secondary | ICD-10-CM | POA: Diagnosis not present

## 2023-12-18 DIAGNOSIS — I82463 Acute embolism and thrombosis of calf muscular vein, bilateral: Secondary | ICD-10-CM | POA: Diagnosis not present

## 2023-12-18 DIAGNOSIS — J020 Streptococcal pharyngitis: Secondary | ICD-10-CM | POA: Diagnosis not present

## 2023-12-18 DIAGNOSIS — I2694 Multiple subsegmental pulmonary emboli without acute cor pulmonale: Secondary | ICD-10-CM | POA: Diagnosis not present

## 2023-12-18 DIAGNOSIS — I82431 Acute embolism and thrombosis of right popliteal vein: Secondary | ICD-10-CM | POA: Diagnosis not present

## 2023-12-18 DIAGNOSIS — M109 Gout, unspecified: Secondary | ICD-10-CM | POA: Diagnosis not present

## 2023-12-18 DIAGNOSIS — D508 Other iron deficiency anemias: Secondary | ICD-10-CM | POA: Diagnosis not present

## 2023-12-18 DIAGNOSIS — B950 Streptococcus, group A, as the cause of diseases classified elsewhere: Secondary | ICD-10-CM | POA: Diagnosis not present

## 2023-12-18 DIAGNOSIS — K219 Gastro-esophageal reflux disease without esophagitis: Secondary | ICD-10-CM | POA: Diagnosis not present

## 2023-12-18 DIAGNOSIS — I824Z3 Acute embolism and thrombosis of unspecified deep veins of distal lower extremity, bilateral: Secondary | ICD-10-CM | POA: Diagnosis not present

## 2023-12-18 DIAGNOSIS — F419 Anxiety disorder, unspecified: Secondary | ICD-10-CM | POA: Diagnosis not present

## 2023-12-18 DIAGNOSIS — I2699 Other pulmonary embolism without acute cor pulmonale: Secondary | ICD-10-CM | POA: Diagnosis not present

## 2023-12-19 DIAGNOSIS — B950 Streptococcus, group A, as the cause of diseases classified elsewhere: Secondary | ICD-10-CM | POA: Diagnosis not present

## 2023-12-19 DIAGNOSIS — I824Z3 Acute embolism and thrombosis of unspecified deep veins of distal lower extremity, bilateral: Secondary | ICD-10-CM | POA: Diagnosis not present

## 2023-12-19 DIAGNOSIS — I2694 Multiple subsegmental pulmonary emboli without acute cor pulmonale: Secondary | ICD-10-CM | POA: Diagnosis not present

## 2023-12-19 DIAGNOSIS — D508 Other iron deficiency anemias: Secondary | ICD-10-CM | POA: Diagnosis not present

## 2023-12-19 DIAGNOSIS — I82463 Acute embolism and thrombosis of calf muscular vein, bilateral: Secondary | ICD-10-CM | POA: Diagnosis not present

## 2023-12-19 DIAGNOSIS — I82431 Acute embolism and thrombosis of right popliteal vein: Secondary | ICD-10-CM | POA: Diagnosis not present

## 2023-12-19 DIAGNOSIS — I2699 Other pulmonary embolism without acute cor pulmonale: Secondary | ICD-10-CM | POA: Diagnosis not present

## 2023-12-19 DIAGNOSIS — F419 Anxiety disorder, unspecified: Secondary | ICD-10-CM | POA: Diagnosis not present

## 2023-12-19 DIAGNOSIS — K219 Gastro-esophageal reflux disease without esophagitis: Secondary | ICD-10-CM | POA: Diagnosis not present

## 2023-12-19 DIAGNOSIS — E785 Hyperlipidemia, unspecified: Secondary | ICD-10-CM | POA: Diagnosis not present

## 2023-12-19 DIAGNOSIS — M109 Gout, unspecified: Secondary | ICD-10-CM | POA: Diagnosis not present

## 2023-12-19 DIAGNOSIS — J020 Streptococcal pharyngitis: Secondary | ICD-10-CM | POA: Diagnosis not present

## 2023-12-20 ENCOUNTER — Encounter: Attending: Physician Assistant | Primary: Internal Medicine

## 2023-12-20 DIAGNOSIS — I824Z3 Acute embolism and thrombosis of unspecified deep veins of distal lower extremity, bilateral: Secondary | ICD-10-CM | POA: Diagnosis not present

## 2023-12-20 DIAGNOSIS — I2694 Multiple subsegmental pulmonary emboli without acute cor pulmonale: Secondary | ICD-10-CM | POA: Diagnosis not present

## 2023-12-20 DIAGNOSIS — I2699 Other pulmonary embolism without acute cor pulmonale: Secondary | ICD-10-CM | POA: Diagnosis not present

## 2023-12-20 MED ORDER — MONTELUKAST SODIUM 10 MG PO TABS
10 | ORAL_TABLET | Freq: Every day | ORAL | 3 refills | 90.00000 days | Status: DC
Start: 2023-12-20 — End: 2024-10-09

## 2023-12-20 NOTE — Telephone Encounter (Signed)
PCP: Marvia Pickles, MD    LAST OFFICE VISIT: 11/10/2023    LAST REFILL PER CHART:  Medication:montelukast (SINGULAIR) 10 MG tablet   Ordered On:02/26/2023  Instructions:TAKE 1 TABLET EVERY DAY   Dispense:90 tablets  Refills:3      Future Appointments   Date Time Provider Department Center   12/20/2023  1:00 PM Reather Converse, PA-C [facility] Sched   01/25/2024  3:00 PM [facility] LAB VISIT [facility]   02/01/2024  3:20 PM Hunte, Toniann Fail, MD [facility]

## 2023-12-21 DIAGNOSIS — I824Z3 Acute embolism and thrombosis of unspecified deep veins of distal lower extremity, bilateral: Secondary | ICD-10-CM | POA: Diagnosis not present

## 2023-12-21 DIAGNOSIS — I2699 Other pulmonary embolism without acute cor pulmonale: Secondary | ICD-10-CM | POA: Diagnosis not present

## 2023-12-21 DIAGNOSIS — I2694 Multiple subsegmental pulmonary emboli without acute cor pulmonale: Secondary | ICD-10-CM | POA: Diagnosis not present

## 2023-12-22 DIAGNOSIS — I2699 Other pulmonary embolism without acute cor pulmonale: Secondary | ICD-10-CM | POA: Diagnosis not present

## 2023-12-22 DIAGNOSIS — I824Z3 Acute embolism and thrombosis of unspecified deep veins of distal lower extremity, bilateral: Secondary | ICD-10-CM | POA: Diagnosis not present

## 2023-12-22 DIAGNOSIS — I2694 Multiple subsegmental pulmonary emboli without acute cor pulmonale: Secondary | ICD-10-CM | POA: Diagnosis not present

## 2023-12-29 DIAGNOSIS — D509 Iron deficiency anemia, unspecified: Secondary | ICD-10-CM | POA: Diagnosis not present

## 2023-12-29 DIAGNOSIS — C183 Malignant neoplasm of hepatic flexure: Secondary | ICD-10-CM | POA: Diagnosis not present

## 2023-12-30 DIAGNOSIS — K6389 Other specified diseases of intestine: Secondary | ICD-10-CM | POA: Diagnosis not present

## 2023-12-30 DIAGNOSIS — Z5111 Encounter for antineoplastic chemotherapy: Secondary | ICD-10-CM | POA: Diagnosis not present

## 2023-12-30 DIAGNOSIS — Z5112 Encounter for antineoplastic immunotherapy: Secondary | ICD-10-CM | POA: Diagnosis not present

## 2023-12-30 DIAGNOSIS — C182 Malignant neoplasm of ascending colon: Secondary | ICD-10-CM | POA: Diagnosis not present

## 2024-01-01 DIAGNOSIS — Z452 Encounter for adjustment and management of vascular access device: Secondary | ICD-10-CM | POA: Diagnosis not present

## 2024-01-01 DIAGNOSIS — C182 Malignant neoplasm of ascending colon: Secondary | ICD-10-CM | POA: Diagnosis not present

## 2024-01-02 DIAGNOSIS — C182 Malignant neoplasm of ascending colon: Secondary | ICD-10-CM | POA: Diagnosis not present

## 2024-01-02 DIAGNOSIS — Z5112 Encounter for antineoplastic immunotherapy: Secondary | ICD-10-CM | POA: Diagnosis not present

## 2024-01-05 DIAGNOSIS — M109 Gout, unspecified: Secondary | ICD-10-CM | POA: Diagnosis not present

## 2024-01-05 DIAGNOSIS — I081 Rheumatic disorders of both mitral and tricuspid valves: Secondary | ICD-10-CM | POA: Diagnosis not present

## 2024-01-05 DIAGNOSIS — Z48812 Encounter for surgical aftercare following surgery on the circulatory system: Secondary | ICD-10-CM | POA: Diagnosis not present

## 2024-01-05 DIAGNOSIS — J020 Streptococcal pharyngitis: Secondary | ICD-10-CM | POA: Diagnosis not present

## 2024-01-05 DIAGNOSIS — C19 Malignant neoplasm of rectosigmoid junction: Secondary | ICD-10-CM | POA: Diagnosis not present

## 2024-01-05 DIAGNOSIS — C482 Malignant neoplasm of peritoneum, unspecified: Secondary | ICD-10-CM | POA: Diagnosis not present

## 2024-01-05 DIAGNOSIS — K5901 Slow transit constipation: Secondary | ICD-10-CM | POA: Diagnosis not present

## 2024-01-05 DIAGNOSIS — I129 Hypertensive chronic kidney disease with stage 1 through stage 4 chronic kidney disease, or unspecified chronic kidney disease: Secondary | ICD-10-CM | POA: Diagnosis not present

## 2024-01-05 DIAGNOSIS — D631 Anemia in chronic kidney disease: Secondary | ICD-10-CM | POA: Diagnosis not present

## 2024-01-05 DIAGNOSIS — Z6826 Body mass index (BMI) 26.0-26.9, adult: Secondary | ICD-10-CM | POA: Diagnosis not present

## 2024-01-05 DIAGNOSIS — K2100 Gastro-esophageal reflux disease with esophagitis, without bleeding: Secondary | ICD-10-CM | POA: Diagnosis not present

## 2024-01-05 DIAGNOSIS — J690 Pneumonitis due to inhalation of food and vomit: Secondary | ICD-10-CM | POA: Diagnosis not present

## 2024-01-05 DIAGNOSIS — D500 Iron deficiency anemia secondary to blood loss (chronic): Secondary | ICD-10-CM | POA: Diagnosis not present

## 2024-01-05 DIAGNOSIS — C787 Secondary malignant neoplasm of liver and intrahepatic bile duct: Secondary | ICD-10-CM | POA: Diagnosis not present

## 2024-01-05 DIAGNOSIS — B9681 Helicobacter pylori [H. pylori] as the cause of diseases classified elsewhere: Secondary | ICD-10-CM | POA: Diagnosis not present

## 2024-01-05 DIAGNOSIS — E1122 Type 2 diabetes mellitus with diabetic chronic kidney disease: Secondary | ICD-10-CM | POA: Diagnosis not present

## 2024-01-05 DIAGNOSIS — C183 Malignant neoplasm of hepatic flexure: Secondary | ICD-10-CM | POA: Diagnosis not present

## 2024-01-05 DIAGNOSIS — N1832 Chronic kidney disease, stage 3b: Secondary | ICD-10-CM | POA: Diagnosis not present

## 2024-01-05 DIAGNOSIS — E785 Hyperlipidemia, unspecified: Secondary | ICD-10-CM | POA: Diagnosis not present

## 2024-01-05 DIAGNOSIS — Z95828 Presence of other vascular implants and grafts: Secondary | ICD-10-CM | POA: Diagnosis not present

## 2024-01-05 DIAGNOSIS — F419 Anxiety disorder, unspecified: Secondary | ICD-10-CM | POA: Diagnosis not present

## 2024-01-05 DIAGNOSIS — D630 Anemia in neoplastic disease: Secondary | ICD-10-CM | POA: Diagnosis not present

## 2024-01-05 DIAGNOSIS — I2694 Multiple subsegmental pulmonary emboli without acute cor pulmonale: Secondary | ICD-10-CM | POA: Diagnosis not present

## 2024-01-05 DIAGNOSIS — I82463 Acute embolism and thrombosis of calf muscular vein, bilateral: Secondary | ICD-10-CM | POA: Diagnosis not present

## 2024-01-10 DIAGNOSIS — I82463 Acute embolism and thrombosis of calf muscular vein, bilateral: Secondary | ICD-10-CM | POA: Diagnosis not present

## 2024-01-10 DIAGNOSIS — N179 Acute kidney failure, unspecified: Secondary | ICD-10-CM | POA: Diagnosis not present

## 2024-01-10 DIAGNOSIS — I2699 Other pulmonary embolism without acute cor pulmonale: Secondary | ICD-10-CM | POA: Diagnosis not present

## 2024-01-10 DIAGNOSIS — Z09 Encounter for follow-up examination after completed treatment for conditions other than malignant neoplasm: Secondary | ICD-10-CM | POA: Diagnosis not present

## 2024-01-10 DIAGNOSIS — D649 Anemia, unspecified: Secondary | ICD-10-CM | POA: Diagnosis not present

## 2024-01-11 DIAGNOSIS — I2694 Multiple subsegmental pulmonary emboli without acute cor pulmonale: Secondary | ICD-10-CM | POA: Diagnosis not present

## 2024-01-11 DIAGNOSIS — C787 Secondary malignant neoplasm of liver and intrahepatic bile duct: Secondary | ICD-10-CM | POA: Diagnosis not present

## 2024-01-11 DIAGNOSIS — C19 Malignant neoplasm of rectosigmoid junction: Secondary | ICD-10-CM | POA: Diagnosis not present

## 2024-01-11 DIAGNOSIS — C482 Malignant neoplasm of peritoneum, unspecified: Secondary | ICD-10-CM | POA: Diagnosis not present

## 2024-01-11 DIAGNOSIS — C183 Malignant neoplasm of hepatic flexure: Secondary | ICD-10-CM | POA: Diagnosis not present

## 2024-01-11 DIAGNOSIS — Z48812 Encounter for surgical aftercare following surgery on the circulatory system: Secondary | ICD-10-CM | POA: Diagnosis not present

## 2024-01-12 DIAGNOSIS — I82431 Acute embolism and thrombosis of right popliteal vein: Secondary | ICD-10-CM | POA: Diagnosis not present

## 2024-01-12 DIAGNOSIS — I82463 Acute embolism and thrombosis of calf muscular vein, bilateral: Secondary | ICD-10-CM | POA: Diagnosis not present

## 2024-01-13 DIAGNOSIS — Z8719 Personal history of other diseases of the digestive system: Secondary | ICD-10-CM | POA: Diagnosis not present

## 2024-01-13 DIAGNOSIS — N1831 Chronic kidney disease, stage 3a: Secondary | ICD-10-CM | POA: Diagnosis not present

## 2024-01-13 DIAGNOSIS — E7800 Pure hypercholesterolemia, unspecified: Secondary | ICD-10-CM | POA: Diagnosis not present

## 2024-01-13 DIAGNOSIS — E1169 Type 2 diabetes mellitus with other specified complication: Secondary | ICD-10-CM | POA: Diagnosis not present

## 2024-01-13 DIAGNOSIS — D6489 Other specified anemias: Secondary | ICD-10-CM | POA: Diagnosis not present

## 2024-01-13 DIAGNOSIS — K219 Gastro-esophageal reflux disease without esophagitis: Secondary | ICD-10-CM | POA: Diagnosis not present

## 2024-01-13 DIAGNOSIS — I10 Essential (primary) hypertension: Secondary | ICD-10-CM | POA: Diagnosis not present

## 2024-01-13 DIAGNOSIS — I2699 Other pulmonary embolism without acute cor pulmonale: Secondary | ICD-10-CM | POA: Diagnosis not present

## 2024-01-13 DIAGNOSIS — E440 Moderate protein-calorie malnutrition: Secondary | ICD-10-CM | POA: Diagnosis not present

## 2024-01-13 DIAGNOSIS — K6389 Other specified diseases of intestine: Secondary | ICD-10-CM | POA: Diagnosis not present

## 2024-01-13 DIAGNOSIS — A4189 Other specified sepsis: Secondary | ICD-10-CM | POA: Diagnosis not present

## 2024-01-13 DIAGNOSIS — C787 Secondary malignant neoplasm of liver and intrahepatic bile duct: Secondary | ICD-10-CM | POA: Diagnosis not present

## 2024-01-13 DIAGNOSIS — D72828 Other elevated white blood cell count: Secondary | ICD-10-CM | POA: Diagnosis not present

## 2024-01-13 DIAGNOSIS — Z6834 Body mass index (BMI) 34.0-34.9, adult: Secondary | ICD-10-CM | POA: Diagnosis not present

## 2024-01-13 DIAGNOSIS — N1832 Chronic kidney disease, stage 3b: Secondary | ICD-10-CM | POA: Diagnosis not present

## 2024-01-13 DIAGNOSIS — I2489 Other forms of acute ischemic heart disease: Secondary | ICD-10-CM | POA: Diagnosis not present

## 2024-01-13 DIAGNOSIS — R0602 Shortness of breath: Secondary | ICD-10-CM | POA: Diagnosis not present

## 2024-01-13 DIAGNOSIS — D72829 Elevated white blood cell count, unspecified: Secondary | ICD-10-CM | POA: Diagnosis not present

## 2024-01-13 DIAGNOSIS — D62 Acute posthemorrhagic anemia: Secondary | ICD-10-CM | POA: Diagnosis not present

## 2024-01-13 DIAGNOSIS — E1122 Type 2 diabetes mellitus with diabetic chronic kidney disease: Secondary | ICD-10-CM | POA: Diagnosis not present

## 2024-01-13 DIAGNOSIS — R6510 Systemic inflammatory response syndrome (SIRS) of non-infectious origin without acute organ dysfunction: Secondary | ICD-10-CM | POA: Diagnosis not present

## 2024-01-13 DIAGNOSIS — E872 Acidosis, unspecified: Secondary | ICD-10-CM | POA: Diagnosis not present

## 2024-01-13 DIAGNOSIS — R0789 Other chest pain: Secondary | ICD-10-CM | POA: Diagnosis not present

## 2024-01-13 DIAGNOSIS — D630 Anemia in neoplastic disease: Secondary | ICD-10-CM | POA: Diagnosis not present

## 2024-01-13 DIAGNOSIS — Z59811 Housing instability, housed, with risk of homelessness: Secondary | ICD-10-CM | POA: Diagnosis not present

## 2024-01-13 DIAGNOSIS — C182 Malignant neoplasm of ascending colon: Secondary | ICD-10-CM | POA: Diagnosis not present

## 2024-01-13 DIAGNOSIS — C801 Malignant (primary) neoplasm, unspecified: Secondary | ICD-10-CM | POA: Diagnosis not present

## 2024-01-13 DIAGNOSIS — R062 Wheezing: Secondary | ICD-10-CM | POA: Diagnosis not present

## 2024-01-13 DIAGNOSIS — A419 Sepsis, unspecified organism: Secondary | ICD-10-CM | POA: Diagnosis not present

## 2024-01-13 DIAGNOSIS — I129 Hypertensive chronic kidney disease with stage 1 through stage 4 chronic kidney disease, or unspecified chronic kidney disease: Secondary | ICD-10-CM | POA: Diagnosis not present

## 2024-01-13 DIAGNOSIS — I214 Non-ST elevation (NSTEMI) myocardial infarction: Secondary | ICD-10-CM | POA: Diagnosis not present

## 2024-01-13 DIAGNOSIS — D631 Anemia in chronic kidney disease: Secondary | ICD-10-CM | POA: Diagnosis not present

## 2024-01-13 DIAGNOSIS — R0902 Hypoxemia: Secondary | ICD-10-CM | POA: Diagnosis not present

## 2024-01-13 DIAGNOSIS — Z794 Long term (current) use of insulin: Secondary | ICD-10-CM | POA: Diagnosis not present

## 2024-01-13 DIAGNOSIS — J680 Bronchitis and pneumonitis due to chemicals, gases, fumes and vapors: Secondary | ICD-10-CM | POA: Diagnosis not present

## 2024-01-13 DIAGNOSIS — D500 Iron deficiency anemia secondary to blood loss (chronic): Secondary | ICD-10-CM | POA: Diagnosis not present

## 2024-01-13 DIAGNOSIS — D6481 Anemia due to antineoplastic chemotherapy: Secondary | ICD-10-CM | POA: Diagnosis not present

## 2024-01-13 DIAGNOSIS — C183 Malignant neoplasm of hepatic flexure: Secondary | ICD-10-CM | POA: Diagnosis not present

## 2024-01-13 DIAGNOSIS — K529 Noninfective gastroenteritis and colitis, unspecified: Secondary | ICD-10-CM | POA: Diagnosis not present

## 2024-01-13 DIAGNOSIS — E119 Type 2 diabetes mellitus without complications: Secondary | ICD-10-CM | POA: Diagnosis not present

## 2024-01-13 DIAGNOSIS — E785 Hyperlipidemia, unspecified: Secondary | ICD-10-CM | POA: Diagnosis not present

## 2024-01-13 DIAGNOSIS — D509 Iron deficiency anemia, unspecified: Secondary | ICD-10-CM | POA: Diagnosis not present

## 2024-01-13 DIAGNOSIS — R7989 Other specified abnormal findings of blood chemistry: Secondary | ICD-10-CM | POA: Diagnosis not present

## 2024-01-13 DIAGNOSIS — E876 Hypokalemia: Secondary | ICD-10-CM | POA: Diagnosis not present

## 2024-01-13 DIAGNOSIS — Z86718 Personal history of other venous thrombosis and embolism: Secondary | ICD-10-CM | POA: Diagnosis not present

## 2024-01-13 DIAGNOSIS — R9431 Abnormal electrocardiogram [ECG] [EKG]: Secondary | ICD-10-CM | POA: Diagnosis not present

## 2024-01-13 DIAGNOSIS — C786 Secondary malignant neoplasm of retroperitoneum and peritoneum: Secondary | ICD-10-CM | POA: Diagnosis not present

## 2024-01-13 DIAGNOSIS — M109 Gout, unspecified: Secondary | ICD-10-CM | POA: Diagnosis not present

## 2024-01-14 DIAGNOSIS — R9431 Abnormal electrocardiogram [ECG] [EKG]: Secondary | ICD-10-CM | POA: Diagnosis not present

## 2024-01-14 DIAGNOSIS — Z86718 Personal history of other venous thrombosis and embolism: Secondary | ICD-10-CM | POA: Diagnosis not present

## 2024-01-14 DIAGNOSIS — A4189 Other specified sepsis: Secondary | ICD-10-CM | POA: Diagnosis not present

## 2024-01-14 DIAGNOSIS — C182 Malignant neoplasm of ascending colon: Secondary | ICD-10-CM | POA: Diagnosis not present

## 2024-01-14 DIAGNOSIS — R0602 Shortness of breath: Secondary | ICD-10-CM | POA: Diagnosis not present

## 2024-01-14 DIAGNOSIS — E872 Acidosis, unspecified: Secondary | ICD-10-CM | POA: Diagnosis not present

## 2024-01-14 DIAGNOSIS — E876 Hypokalemia: Secondary | ICD-10-CM | POA: Diagnosis not present

## 2024-01-14 DIAGNOSIS — C787 Secondary malignant neoplasm of liver and intrahepatic bile duct: Secondary | ICD-10-CM | POA: Diagnosis not present

## 2024-01-14 DIAGNOSIS — I10 Essential (primary) hypertension: Secondary | ICD-10-CM | POA: Diagnosis not present

## 2024-01-14 DIAGNOSIS — I2699 Other pulmonary embolism without acute cor pulmonale: Secondary | ICD-10-CM | POA: Diagnosis not present

## 2024-01-14 DIAGNOSIS — R7989 Other specified abnormal findings of blood chemistry: Secondary | ICD-10-CM | POA: Diagnosis not present

## 2024-01-14 DIAGNOSIS — R6510 Systemic inflammatory response syndrome (SIRS) of non-infectious origin without acute organ dysfunction: Secondary | ICD-10-CM | POA: Diagnosis not present

## 2024-01-14 DIAGNOSIS — E1169 Type 2 diabetes mellitus with other specified complication: Secondary | ICD-10-CM | POA: Diagnosis not present

## 2024-01-15 DIAGNOSIS — R0602 Shortness of breath: Secondary | ICD-10-CM | POA: Diagnosis not present

## 2024-01-15 DIAGNOSIS — R0902 Hypoxemia: Secondary | ICD-10-CM | POA: Diagnosis not present

## 2024-01-15 DIAGNOSIS — R7989 Other specified abnormal findings of blood chemistry: Secondary | ICD-10-CM | POA: Diagnosis not present

## 2024-01-15 DIAGNOSIS — E876 Hypokalemia: Secondary | ICD-10-CM | POA: Diagnosis not present

## 2024-01-15 DIAGNOSIS — K219 Gastro-esophageal reflux disease without esophagitis: Secondary | ICD-10-CM | POA: Diagnosis not present

## 2024-01-15 DIAGNOSIS — E785 Hyperlipidemia, unspecified: Secondary | ICD-10-CM | POA: Diagnosis not present

## 2024-01-15 DIAGNOSIS — A419 Sepsis, unspecified organism: Secondary | ICD-10-CM | POA: Diagnosis not present

## 2024-01-15 DIAGNOSIS — D72829 Elevated white blood cell count, unspecified: Secondary | ICD-10-CM | POA: Diagnosis not present

## 2024-01-15 DIAGNOSIS — I2699 Other pulmonary embolism without acute cor pulmonale: Secondary | ICD-10-CM | POA: Diagnosis not present

## 2024-01-15 DIAGNOSIS — D500 Iron deficiency anemia secondary to blood loss (chronic): Secondary | ICD-10-CM | POA: Diagnosis not present

## 2024-01-15 DIAGNOSIS — M109 Gout, unspecified: Secondary | ICD-10-CM | POA: Diagnosis not present

## 2024-01-16 DIAGNOSIS — R7989 Other specified abnormal findings of blood chemistry: Secondary | ICD-10-CM | POA: Diagnosis not present

## 2024-01-16 DIAGNOSIS — D500 Iron deficiency anemia secondary to blood loss (chronic): Secondary | ICD-10-CM | POA: Diagnosis not present

## 2024-01-16 DIAGNOSIS — D72829 Elevated white blood cell count, unspecified: Secondary | ICD-10-CM | POA: Diagnosis not present

## 2024-01-16 DIAGNOSIS — R0902 Hypoxemia: Secondary | ICD-10-CM | POA: Diagnosis not present

## 2024-01-16 DIAGNOSIS — A419 Sepsis, unspecified organism: Secondary | ICD-10-CM | POA: Diagnosis not present

## 2024-01-16 DIAGNOSIS — E785 Hyperlipidemia, unspecified: Secondary | ICD-10-CM | POA: Diagnosis not present

## 2024-01-16 DIAGNOSIS — E876 Hypokalemia: Secondary | ICD-10-CM | POA: Diagnosis not present

## 2024-01-16 DIAGNOSIS — M109 Gout, unspecified: Secondary | ICD-10-CM | POA: Diagnosis not present

## 2024-01-16 DIAGNOSIS — R0602 Shortness of breath: Secondary | ICD-10-CM | POA: Diagnosis not present

## 2024-01-16 DIAGNOSIS — K219 Gastro-esophageal reflux disease without esophagitis: Secondary | ICD-10-CM | POA: Diagnosis not present

## 2024-01-16 DIAGNOSIS — I2699 Other pulmonary embolism without acute cor pulmonale: Secondary | ICD-10-CM | POA: Diagnosis not present

## 2024-01-24 DIAGNOSIS — C482 Malignant neoplasm of peritoneum, unspecified: Secondary | ICD-10-CM | POA: Diagnosis not present

## 2024-01-24 DIAGNOSIS — Z48812 Encounter for surgical aftercare following surgery on the circulatory system: Secondary | ICD-10-CM | POA: Diagnosis not present

## 2024-01-24 DIAGNOSIS — C183 Malignant neoplasm of hepatic flexure: Secondary | ICD-10-CM | POA: Diagnosis not present

## 2024-01-24 DIAGNOSIS — C19 Malignant neoplasm of rectosigmoid junction: Secondary | ICD-10-CM | POA: Diagnosis not present

## 2024-01-24 DIAGNOSIS — C787 Secondary malignant neoplasm of liver and intrahepatic bile duct: Secondary | ICD-10-CM | POA: Diagnosis not present

## 2024-01-24 DIAGNOSIS — I2694 Multiple subsegmental pulmonary emboli without acute cor pulmonale: Secondary | ICD-10-CM | POA: Diagnosis not present

## 2024-01-25 ENCOUNTER — Encounter: Payer: MEDICARE | Primary: Internal Medicine

## 2024-01-27 DIAGNOSIS — C182 Malignant neoplasm of ascending colon: Secondary | ICD-10-CM | POA: Diagnosis not present

## 2024-01-27 DIAGNOSIS — Z5111 Encounter for antineoplastic chemotherapy: Secondary | ICD-10-CM | POA: Diagnosis not present

## 2024-01-27 DIAGNOSIS — Z5112 Encounter for antineoplastic immunotherapy: Secondary | ICD-10-CM | POA: Diagnosis not present

## 2024-01-28 DIAGNOSIS — Z48812 Encounter for surgical aftercare following surgery on the circulatory system: Secondary | ICD-10-CM | POA: Diagnosis not present

## 2024-01-28 DIAGNOSIS — C787 Secondary malignant neoplasm of liver and intrahepatic bile duct: Secondary | ICD-10-CM | POA: Diagnosis not present

## 2024-01-28 DIAGNOSIS — C183 Malignant neoplasm of hepatic flexure: Secondary | ICD-10-CM | POA: Diagnosis not present

## 2024-01-28 DIAGNOSIS — C482 Malignant neoplasm of peritoneum, unspecified: Secondary | ICD-10-CM | POA: Diagnosis not present

## 2024-01-28 DIAGNOSIS — I2694 Multiple subsegmental pulmonary emboli without acute cor pulmonale: Secondary | ICD-10-CM | POA: Diagnosis not present

## 2024-01-28 DIAGNOSIS — C19 Malignant neoplasm of rectosigmoid junction: Secondary | ICD-10-CM | POA: Diagnosis not present

## 2024-01-29 DIAGNOSIS — C182 Malignant neoplasm of ascending colon: Secondary | ICD-10-CM | POA: Diagnosis not present

## 2024-01-30 DIAGNOSIS — C182 Malignant neoplasm of ascending colon: Secondary | ICD-10-CM | POA: Diagnosis not present

## 2024-02-01 ENCOUNTER — Encounter: Payer: MEDICARE | Attending: Internal Medicine | Primary: Internal Medicine

## 2024-02-01 DIAGNOSIS — C787 Secondary malignant neoplasm of liver and intrahepatic bile duct: Secondary | ICD-10-CM | POA: Diagnosis not present

## 2024-02-01 DIAGNOSIS — C183 Malignant neoplasm of hepatic flexure: Secondary | ICD-10-CM | POA: Diagnosis not present

## 2024-02-01 DIAGNOSIS — C482 Malignant neoplasm of peritoneum, unspecified: Secondary | ICD-10-CM | POA: Diagnosis not present

## 2024-02-01 DIAGNOSIS — I2694 Multiple subsegmental pulmonary emboli without acute cor pulmonale: Secondary | ICD-10-CM | POA: Diagnosis not present

## 2024-02-01 DIAGNOSIS — C19 Malignant neoplasm of rectosigmoid junction: Secondary | ICD-10-CM | POA: Diagnosis not present

## 2024-02-01 DIAGNOSIS — Z48812 Encounter for surgical aftercare following surgery on the circulatory system: Secondary | ICD-10-CM | POA: Diagnosis not present

## 2024-02-02 DIAGNOSIS — D509 Iron deficiency anemia, unspecified: Secondary | ICD-10-CM | POA: Diagnosis not present

## 2024-02-02 DIAGNOSIS — C183 Malignant neoplasm of hepatic flexure: Secondary | ICD-10-CM | POA: Diagnosis not present

## 2024-02-03 ENCOUNTER — Inpatient Hospital Stay: Admit: 2024-02-03 | Payer: MEDICARE | Primary: Internal Medicine

## 2024-02-03 DIAGNOSIS — Z48812 Encounter for surgical aftercare following surgery on the circulatory system: Secondary | ICD-10-CM | POA: Diagnosis not present

## 2024-02-03 DIAGNOSIS — C183 Malignant neoplasm of hepatic flexure: Secondary | ICD-10-CM | POA: Diagnosis not present

## 2024-02-03 DIAGNOSIS — C19 Malignant neoplasm of rectosigmoid junction: Secondary | ICD-10-CM | POA: Diagnosis not present

## 2024-02-03 DIAGNOSIS — C787 Secondary malignant neoplasm of liver and intrahepatic bile duct: Secondary | ICD-10-CM | POA: Diagnosis not present

## 2024-02-03 DIAGNOSIS — I2694 Multiple subsegmental pulmonary emboli without acute cor pulmonale: Secondary | ICD-10-CM | POA: Diagnosis not present

## 2024-02-03 DIAGNOSIS — C482 Malignant neoplasm of peritoneum, unspecified: Secondary | ICD-10-CM | POA: Diagnosis not present

## 2024-02-03 DIAGNOSIS — D500 Iron deficiency anemia secondary to blood loss (chronic): Secondary | ICD-10-CM

## 2024-02-03 LAB — COMPREHENSIVE METABOLIC PANEL
ALT: 19 U/L (ref 13–56)
AST: 15 U/L (ref 10–38)
Albumin/Globulin Ratio: 1.3 (ref 0.8–1.7)
Albumin: 3.8 g/dL (ref 3.4–5.0)
Alk Phosphatase: 85 U/L (ref 45–117)
Anion Gap: 2 mmol/L — ABNORMAL LOW (ref 3.0–18)
BUN/Creatinine Ratio: 9 — ABNORMAL LOW (ref 12–20)
BUN: 5 mg/dL — ABNORMAL LOW (ref 7.0–18)
CO2: 33 mmol/L — ABNORMAL HIGH (ref 21–32)
Calcium: 9.3 mg/dL (ref 8.5–10.1)
Chloride: 106 mmol/L (ref 100–111)
Creatinine: 0.57 mg/dL — ABNORMAL LOW (ref 0.6–1.3)
Est, Glom Filt Rate: >90 mL/min/{1.73_m2} (ref >60)
Globulin: 2.9 g/dL (ref 2.0–4.0)
Glucose: 89 mg/dL (ref 74–99)
Potassium: 3.7 mmol/L (ref 3.5–5.5)
Sodium: 141 mmol/L (ref 136–145)
Total Bilirubin: 1 mg/dL (ref 0.2–1.0)
Total Protein: 6.7 g/dL (ref 6.4–8.2)

## 2024-02-03 LAB — CBC WITH AUTO DIFFERENTIAL
Basophils %: 0.7 % (ref 0.0–2.0)
Basophils Absolute: 0.03 10*3/uL (ref 0.00–0.10)
Eosinophils %: 1.4 % (ref 0.0–5.0)
Eosinophils Absolute: 0.06 10*3/uL (ref 0.00–0.40)
Hematocrit: 35.2 % (ref 35.0–45.0)
Hemoglobin: 11 g/dL — ABNORMAL LOW (ref 12.0–16.0)
Immature Granulocytes %: 0.2 % (ref 0.0–0.5)
Immature Granulocytes Absolute: 0.01 10*3/uL (ref 0.00–0.04)
Lymphocytes %: 19.1 % — ABNORMAL LOW (ref 21.0–52.0)
Lymphocytes Absolute: 0.81 10*3/uL — ABNORMAL LOW (ref 0.90–3.60)
MCH: 26.7 pg (ref 24.0–34.0)
MCHC: 31.3 g/dL (ref 31.0–37.0)
MCV: 85.4 FL (ref 78.0–100.0)
MPV: 11.4 FL (ref 9.2–11.8)
Monocytes %: 9.9 % (ref 3.0–10.0)
Monocytes Absolute: 0.42 10*3/uL (ref 0.05–1.20)
Neutrophils %: 68.7 % (ref 40.0–73.0)
Neutrophils Absolute: 2.9 10*3/uL (ref 1.80–8.00)
Nucleated RBCs: 0 /100{WBCs}
Platelets: 267 10*3/uL (ref 135–420)
RBC: 4.12 M/uL — ABNORMAL LOW (ref 4.20–5.30)
RDW: 14.4 % (ref 11.6–14.5)
WBC: 4.2 10*3/uL — ABNORMAL LOW (ref 4.6–13.2)
nRBC: 0 10*3/uL (ref 0.00–0.01)

## 2024-02-03 LAB — TSH + FREE T4 PANEL
T4 Free: 1.1 ng/dL (ref 0.7–1.5)
TSH, 3rd Generation: 6.77 u[IU]/mL — ABNORMAL HIGH (ref 0.36–3.74)

## 2024-02-03 LAB — LIPID PANEL
Chol/HDL Ratio: 2.5 (ref 0–5.0)
Cholesterol, Total: 163 mg/dL (ref <200)
HDL: 65 mg/dL — ABNORMAL HIGH (ref 40–60)
LDL Cholesterol: 83.8 mg/dL (ref 0–100)
Triglycerides: 71 mg/dL (ref <150)
VLDL Cholesterol Calculated: 14.2 mg/dL

## 2024-02-03 LAB — HEMOGLOBIN A1C
Estimated Avg Glucose: 100 mg/dL
Hemoglobin A1C: 5.1 % (ref 4.2–5.6)

## 2024-02-03 LAB — VITAMIN D 25 HYDROXY: Vit D, 25-Hydroxy: 50 ng/mL (ref 30–100)

## 2024-02-04 DIAGNOSIS — J40 Bronchitis, not specified as acute or chronic: Secondary | ICD-10-CM | POA: Diagnosis not present

## 2024-02-04 DIAGNOSIS — D500 Iron deficiency anemia secondary to blood loss (chronic): Secondary | ICD-10-CM | POA: Diagnosis not present

## 2024-02-04 DIAGNOSIS — C183 Malignant neoplasm of hepatic flexure: Secondary | ICD-10-CM | POA: Diagnosis not present

## 2024-02-04 DIAGNOSIS — Z7984 Long term (current) use of oral hypoglycemic drugs: Secondary | ICD-10-CM | POA: Diagnosis not present

## 2024-02-04 DIAGNOSIS — I1310 Hypertensive heart and chronic kidney disease without heart failure, with stage 1 through stage 4 chronic kidney disease, or unspecified chronic kidney disease: Secondary | ICD-10-CM | POA: Diagnosis not present

## 2024-02-04 DIAGNOSIS — I82463 Acute embolism and thrombosis of calf muscular vein, bilateral: Secondary | ICD-10-CM | POA: Diagnosis not present

## 2024-02-04 DIAGNOSIS — Z95828 Presence of other vascular implants and grafts: Secondary | ICD-10-CM | POA: Diagnosis not present

## 2024-02-04 DIAGNOSIS — E1122 Type 2 diabetes mellitus with diabetic chronic kidney disease: Secondary | ICD-10-CM | POA: Diagnosis not present

## 2024-02-04 DIAGNOSIS — C482 Malignant neoplasm of peritoneum, unspecified: Secondary | ICD-10-CM | POA: Diagnosis not present

## 2024-02-04 DIAGNOSIS — C19 Malignant neoplasm of rectosigmoid junction: Secondary | ICD-10-CM | POA: Diagnosis not present

## 2024-02-04 DIAGNOSIS — N1832 Chronic kidney disease, stage 3b: Secondary | ICD-10-CM | POA: Diagnosis not present

## 2024-02-04 DIAGNOSIS — E785 Hyperlipidemia, unspecified: Secondary | ICD-10-CM | POA: Diagnosis not present

## 2024-02-04 DIAGNOSIS — I081 Rheumatic disorders of both mitral and tricuspid valves: Secondary | ICD-10-CM | POA: Diagnosis not present

## 2024-02-04 DIAGNOSIS — M109 Gout, unspecified: Secondary | ICD-10-CM | POA: Diagnosis not present

## 2024-02-04 DIAGNOSIS — K5901 Slow transit constipation: Secondary | ICD-10-CM | POA: Diagnosis not present

## 2024-02-04 DIAGNOSIS — K2100 Gastro-esophageal reflux disease with esophagitis, without bleeding: Secondary | ICD-10-CM | POA: Diagnosis not present

## 2024-02-04 DIAGNOSIS — Z6826 Body mass index (BMI) 26.0-26.9, adult: Secondary | ICD-10-CM | POA: Diagnosis not present

## 2024-02-04 DIAGNOSIS — C787 Secondary malignant neoplasm of liver and intrahepatic bile duct: Secondary | ICD-10-CM | POA: Diagnosis not present

## 2024-02-04 DIAGNOSIS — I2693 Single subsegmental pulmonary embolism without acute cor pulmonale: Secondary | ICD-10-CM | POA: Diagnosis not present

## 2024-02-04 DIAGNOSIS — Z7901 Long term (current) use of anticoagulants: Secondary | ICD-10-CM | POA: Diagnosis not present

## 2024-02-04 DIAGNOSIS — F419 Anxiety disorder, unspecified: Secondary | ICD-10-CM | POA: Diagnosis not present

## 2024-02-05 ENCOUNTER — Encounter

## 2024-02-07 MED ORDER — AMLODIPINE BESYLATE 5 MG PO TABS
5 MG | ORAL_TABLET | Freq: Every day | ORAL | 3 refills | Status: DC
Start: 2024-02-07 — End: 2024-05-03

## 2024-02-07 NOTE — Telephone Encounter (Signed)
 PCP: Marvia Pickles, MD    LAST OFFICE VISIT: 11/10/2023    LAST REFILL PER CHART:  Medication:amLODIPine (NORVASC) 5 MG tablet   Ordered On:12/30/2022  Instructions:Take 1 tablet by mouth daily   Dispense:90 tablets  Refills:3      Future Appointments   Date Time Provider Department Center   02/08/2024  3:40 PM Marvia Pickles, MD [facility] DEP   04/06/2024  8:30 AM Reather Converse, PA-C [facility]

## 2024-02-08 ENCOUNTER — Encounter: Payer: MEDICARE | Attending: Internal Medicine | Primary: Internal Medicine

## 2024-02-09 DIAGNOSIS — C787 Secondary malignant neoplasm of liver and intrahepatic bile duct: Secondary | ICD-10-CM | POA: Diagnosis not present

## 2024-02-09 DIAGNOSIS — C183 Malignant neoplasm of hepatic flexure: Secondary | ICD-10-CM | POA: Diagnosis not present

## 2024-02-09 DIAGNOSIS — I82463 Acute embolism and thrombosis of calf muscular vein, bilateral: Secondary | ICD-10-CM | POA: Diagnosis not present

## 2024-02-09 DIAGNOSIS — I2693 Single subsegmental pulmonary embolism without acute cor pulmonale: Secondary | ICD-10-CM | POA: Diagnosis not present

## 2024-02-09 DIAGNOSIS — C482 Malignant neoplasm of peritoneum, unspecified: Secondary | ICD-10-CM | POA: Diagnosis not present

## 2024-02-09 DIAGNOSIS — C19 Malignant neoplasm of rectosigmoid junction: Secondary | ICD-10-CM | POA: Diagnosis not present

## 2024-02-10 DIAGNOSIS — C182 Malignant neoplasm of ascending colon: Secondary | ICD-10-CM | POA: Diagnosis not present

## 2024-02-10 DIAGNOSIS — Z5112 Encounter for antineoplastic immunotherapy: Secondary | ICD-10-CM | POA: Diagnosis not present

## 2024-02-10 DIAGNOSIS — Z5111 Encounter for antineoplastic chemotherapy: Secondary | ICD-10-CM | POA: Diagnosis not present

## 2024-02-13 DIAGNOSIS — C182 Malignant neoplasm of ascending colon: Secondary | ICD-10-CM | POA: Diagnosis not present

## 2024-02-15 ENCOUNTER — Ambulatory Visit: Admit: 2024-02-15 | Discharge: 2024-02-15 | Payer: MEDICARE | Attending: Internal Medicine | Primary: Internal Medicine

## 2024-02-15 VITALS — BP 124/60 | HR 91 | Temp 98.20000°F | Resp 20 | Ht 62.0 in | Wt 158.0 lb

## 2024-02-15 DIAGNOSIS — Z0000 Encounter for general adult medical examination without abnormal findings: Secondary | ICD-10-CM

## 2024-02-15 NOTE — Patient Instructions (Signed)
 Preventing Falls: Care Instructions  Injuries and health problems such as trouble walking or poor eyesight can increase your risk of falling. So can some medicines. But there are things you can do to help prevent falls. You can exercise to get stronger. You can also arrange your home to make it safer.    Talk to your doctor about the medicines you take. Ask if any of them increase the risk of falls and whether they can be changed or stopped.   Try to exercise regularly. It can help improve your strength and balance. This can help lower your risk of falling.         Practice fall safety and prevention.   Wear low-heeled shoes that fit well and give your feet good support. Talk to your doctor if you have foot problems that make this hard.  Carry a cellphone or wear a medical alert device that you can use to call for help.  Use stepladders instead of chairs to reach high objects. Don't climb if you're at risk for falls. Ask for help, if needed.  Wear the correct eyeglasses, if you need them.        Make your home safer.   Remove rugs, cords, clutter, and furniture from walkways.  Keep your house well lit. Use night-lights in hallways and bathrooms.  Install and use sturdy handrails on stairways.  Wear nonskid footwear, even inside. Don't walk barefoot or in socks without shoes.        Be safe outside.   Use handrails, curb cuts, and ramps whenever possible.  Keep your hands free by using a shoulder bag or backpack.  Try to walk in well-lit areas. Watch out for uneven ground, changes in pavement, and debris.  Be careful in the winter. Walk on the grass or gravel when sidewalks are slippery. Use de-icer on steps and walkways. Add non-slip devices to shoes.    Put grab bars and nonskid mats in your shower or tub and near the toilet. Try to use a shower chair or bath bench when bathing.   Get into a tub or shower by putting in your weaker leg first. Get out with your strong side first. Have a phone or medical alert  device in the bathroom with you.   Where can you learn more?  Go to [employer] and enter G117 to learn more about "Preventing Falls: Care Instructions."  Current as of: July 07, 2023  Content Version: 14.3   [address] .   Care instructions adapted under license by [facility]. If you have questions about a medical condition or this instruction, always ask your healthcare professional. Larene Beach, [facility], disclaims any warranty or liability for your use of this information.         Learning About Being Active as an Older Adult  Why is being active important as you get older?     Being active is one of the best things you can do for your health. And it's never too late to start. Being active--or getting active, if you aren't already--has definite benefits. It can:  Give you more energy,  Keep your mind sharp.  Improve balance to reduce your risk of falls.  Help you manage chronic illness with fewer medicines.  No matter how old you are, how fit you are, or what health problems you have, there is a form of activity that will work for you. And the more physical activity you can do, the better your overall  health will be.  What kinds of activity can help you stay healthy?  Being more active will make your daily activities easier. Physical activity includes planned exercise and things you do in daily life. There are four types of activity:  Aerobic.  Doing aerobic activity makes your heart and lungs strong.  Includes walking, dancing, and gardening.  Aim for at least 2 hours spread throughout the week.  It improves your energy and can help you sleep better.  Muscle-strengthening.  This type of activity can help maintain muscle and strengthen bones.  Includes climbing stairs, using resistance bands, and lifting or carrying heavy loads.  Aim for at least twice a week.  It can help protect the knees and other joints.  Stretching.  Stretching gives you better range of motion  in joints and muscles.  Includes upper arm stretches, calf stretches, and gentle yoga.  Aim for at least twice a week, preferably after your muscles are warmed up from other activities.  It can help you function better in daily life.  Balancing.  This helps you stay coordinated and have good posture.  Includes heel-to-toe walking, tai chi, and certain types of yoga.  Aim for at least 3 days a week.  It can reduce your risk of falling.  Even if you have a hard time meeting the recommendations, it's better to be more active than less active. All activity done in each category counts toward your weekly total. You'd be surprised how daily things like carrying groceries, keeping up with grandchildren, and taking the stairs can add up.  What keeps you from being active?  If you've had a hard time being more active, you're not alone. Maybe you remember being able to do more. Or maybe you've never thought of yourself as being active. It's frustrating when you can't do the things you want. Being more active can help. What's holding you back?  Getting started.  Have a goal, but break it into easy tasks. Small steps build into big accomplishments.  Staying motivated.  If you feel like skipping your activity, remember your goal. Maybe you want to move better and stay independent. Every activity gets you one step closer.  Not feeling your best.  Start with 5 minutes of an activity you enjoy. Prove to yourself you can do it. As you get comfortable, increase your time.  You may not be where you want to be. But you're in the process of getting there. Everyone starts somewhere.  How can you find safe ways to stay active?  Talk with your doctor about any physical challenges you're facing. Make a plan with your doctor if you have a health problem or aren't sure how to get started with activity.  If you're already active, ask your doctor if there is anything you should change to stay safe as your body and health change.  If you tend to  feel dizzy after you take medicine, avoid activity at that time. Try being active before you take your medicine. This will reduce your risk of falls.  If you plan to be active at home, make sure to clear your space before you get started. Remove things like TV cords, coffee tables, and throw rugs. It's safest to have plenty of space to move freely.  The key to getting more active is to take it slow and steady. Try to improve only a little bit at a time. Pick just one area to improve on at first. And if an activity  hurts, stop and talk to your doctor.  Where can you learn more?  Go to [employer] and enter P600 to learn more about "Learning About Being Active as an Older Adult."  Current as of: July 07, 2023  Content Version: 14.3   [address] [place].   Care instructions adapted under license by [facility]. If you have questions about a medical condition or this instruction, always ask your healthcare professional. Larene Beach, [facility], disclaims any warranty or liability for your use of this information.         Eating Healthy Foods: Care Instructions  With every meal, you can make healthy food choices. Try to eat a variety of fruits, vegetables, whole grains, lean proteins, and low-fat dairy products. This can help you get the right balance of nutrients, including vitamins and minerals. Small changes add up over time. You can start by adding one healthy food to your meals each day.    Try to make half your plate fruits and vegetables, one-fourth whole grains, and one-fourth lean proteins. Try including dairy with your meals.   Eat more fruits and vegetables. Try to have them with most meals and snacks.   Foods for healthy eating        Fruits   These can be fresh, frozen, canned, or dried.  Try to choose whole fruit rather than fruit juice.  Eat a variety of colors.        Vegetables   These can be fresh, frozen, canned, or dried.  Beans, peas, and lentils count too.         Whole grains   Choose whole-grain breads, cereals, and noodles.  Try brown rice.        Lean proteins   These can include lean meat, poultry, fish, and eggs.  You can also have tofu, beans, peas, lentils, nuts, and seeds.        Dairy   Try milk, yogurt, and cheese.  Choose low-fat or fat-free when you can.  If you need to, use lactose-free milk or fortified plant-based milk products, such as soy milk.        Water   Drink water when you're thirsty.  Limit sugar-sweetened drinks, including soda, fruit drinks, and sports drinks.  Where can you learn more?  Go to [employer] and enter T756 to learn more about "Eating Healthy Foods: Care Instructions."  Current as of: August 26, 2022  Content Version: 14.3   [address] [place].   Care instructions adapted under license by [facility]. If you have questions about a medical condition or this instruction, always ask your healthcare professional. Larene Beach, [facility], disclaims any warranty or liability for your use of this information.         Advance Directives: Care Instructions  Overview  An advance directive is a legal way to state your wishes at the end of your life. It tells your family and your doctor what to do if you can't say what you want.  There are two main types of advance directives. You can change them any time your wishes change.  Living will.  This form tells your family and your doctor your wishes about life support and other treatment. The form is also called a declaration.  Medical power of attorney.  This form lets you name a person to make treatment decisions for you when you can't speak for yourself. This person is called a health care agent (health care proxy, health care surrogate). The  form is also called a durable power of attorney for health care.  If you do not have an advance directive, decisions about your medical care may be made by a family member, or by a doctor or a judge who doesn't know  you.  It may help to think of an advance directive as a gift to the people who care for you. If you have one, they won't have to make tough decisions by themselves.  For more information, including forms for your state, see the [employer] website ([employer]cy).  Follow-up care is a key part of your treatment and safety. Be sure to make and go to all appointments, and call your doctor if you are having problems. It's also a good idea to know your test results and keep a list of the medicines you take.  What should you include in an advance directive?  Many states have a unique advance directive form. (It may ask you to address specific issues.) Or you might use a universal form that's approved by many states.  If your form doesn't tell you what to address, it may be hard to know what to include in your advance directive. Use the questions below to help you get started.  Who do you want to make decisions about your medical care if you are not able to?  What life-support measures do you want if you have a serious illness that gets worse over time or can't be cured?  What are you most afraid of that might happen? (Maybe you're afraid of having pain, losing your independence, or being kept alive by machines.)  Where would you prefer to die? (Your home? A hospital? A nursing home?)  Do you want to donate your organs when you die?  Do you want certain religious practices performed before you die?  When should you call for help?  Be sure to contact your doctor if you have any questions.  Where can you learn more?  Go to [employer] and enter R264 to learn more about "Advance Directives: Care Instructions."  Current as of: October 22, 2022  Content Version: 14.3   [address] [place].   Care instructions adapted under license by [facility]. If you have questions about a medical condition or this instruction, always ask your healthcare professional.  Larene Beach, [facility], disclaims any warranty or liability for your use of this information.         A Healthy Heart: Care Instructions  Overview     Coronary artery disease, also called heart disease, occurs when a substance called plaque builds up in the vessels that supply oxygen-rich blood to your heart muscle. This can narrow the blood vessels and reduce blood flow. A heart attack happens when blood flow is completely blocked. A high-fat diet, smoking, and other factors increase the risk of heart disease.  Your doctor has found that you have a chance of having heart disease. A heart-healthy lifestyle can help keep your heart healthy and prevent heart disease. This lifestyle includes eating healthy, being active, staying at a weight that's healthy for you, and not smoking or using tobacco. It also includes taking medicines as directed, managing other health conditions, and trying to get a healthy amount of sleep.  Follow-up care is a key part of your treatment and safety. Be sure to make and go to all appointments, and call your doctor if you are having problems. It's also a good idea to know your test results and keep a list  of the medicines you take.  How can you care for yourself at home?  Diet    Use less salt when you cook and eat. This helps lower your blood pressure. Taste food before salting. Add only a little salt when you think you need it. With time, your taste buds will adjust to less salt.     Eat fewer snack items, fast foods, canned soups, and other high-salt, high-fat, processed foods.     Read food labels and try to avoid saturated and trans fats. They increase your risk of heart disease by raising cholesterol levels.     Limit the amount of solid fat--butter, margarine, and shortening--you eat. Use olive, peanut, or canola oil when you cook. Bake, broil, and steam foods instead of frying them.     Eat a variety of fruit and vegetables every day. Dark green, deep orange, red, or yellow fruits  and vegetables are especially good for you. Examples include spinach, carrots, peaches, and berries.     Foods high in fiber can reduce your cholesterol and provide important vitamins and minerals. High-fiber foods include whole-grain cereals and breads, oatmeal, beans, brown rice, citrus fruits, and apples.     Eat lean proteins. Heart-healthy proteins include seafood, lean meats and poultry, eggs, beans, peas, nuts, seeds, and soy products.     Limit drinks and foods with added sugar. These include candy, desserts, and soda pop.   Heart-healthy lifestyle    If your doctor recommends it, get more exercise. For many people, walking is a good choice. Or you may want to swim, bike, or do other activities. Bit by bit, increase the time you're active every day. Try for at least 30 minutes on most days of the week.     Try to quit or cut back on using tobacco and other nicotine products. This includes smoking and vaping. If you need help quitting, talk to your doctor about stop-smoking programs and medicines. These can increase your chances of quitting for good. Quitting is one of the most important things you can do to protect your heart. It is never too late to quit. Try to avoid secondhand smoke too.     Stay at a weight that's healthy for you. Talk to your doctor if you need help losing weight.     Try to get 7 to 9 hours of sleep each night.     Limit alcohol to 2 drinks a day for men and 1 drink a day for women. Too much alcohol can cause health problems.     Manage other health problems such as diabetes, high blood pressure, and high cholesterol. If you think you may have a problem with alcohol or drug use, talk to your doctor.   Medicines    Take your medicines exactly as prescribed. Call your doctor if you think you are having a problem with your medicine.     If your doctor recommends aspirin, take the amount directed each day. Make sure you take aspirin and not another kind of pain reliever, such as  acetaminophen (Tylenol).   When should you call for help?   Call 911 if you have symptoms of a heart attack. These may include:    Chest pain or pressure, or a strange feeling in the chest.     Sweating.     Shortness of breath.     Pain, pressure, or a strange feeling in the back, neck, jaw, or upper belly or in one or  both shoulders or arms.     Lightheadedness or sudden weakness.     A fast or irregular heartbeat.   After you call 911, the operator may tell you to chew 1 adult-strength or 2 to 4 low-dose aspirin. Wait for an ambulance. Do not try to drive yourself.  Watch closely for changes in your health, and be sure to contact your doctor if you have any problems.  Where can you learn more?  Go to [employer].ca and enter F075 to learn more about "A Healthy Heart: Care Instructions."  Current as of: July 07, 2023  Content Version: 14.3   [address], [place].   Care instructions adapted under license by [facility]. If you have questions about a medical condition or this instruction, always ask your healthcare professional. Larene Beach, [facility], disclaims any warranty or liability for your use of this information.    Personalized Preventive Plan for Elaine [name] - 02/15/2024  Medicare offers a range of preventive health benefits. Some of the tests and screenings are paid in full while other may be subject to a deductible, co-insurance, and/or copay.  Some of these benefits include a comprehensive review of your medical history including lifestyle, illnesses that may run in your family, and various assessments and screenings as appropriate.  After reviewing your medical record and screening and assessments performed today your provider may have ordered immunizations, labs, imaging, and/or referrals for you.  A list of these orders (if applicable) as well as your Preventive Care list are included within your After Visit Summary for your review.

## 2024-02-15 NOTE — Progress Notes (Signed)
 Medicare Annual Wellness Visit    Elaine [name] is here for Hypertension, Cholesterol Problem, Blood Sugar Problem (3 month follow up ), and Medicare AWV    Assessment & Plan   Medicare annual wellness visit, subsequent  Essential (primary) hypertension  -     Comprehensive Metabolic Panel; Future  Chronic diastolic congestive heart failure (HCC)  High cholesterol  -     Lipid Panel; Future  Postprocedural hypothyroidism  -     TSH + Free T4 Panel; Future  Vitamin D deficiency  -     Vitamin D 25 Hydroxy; Future  Prediabetes  -     Hemoglobin A1C; Future  Iron deficiency anemia due to chronic blood loss  -     CBC with Auto Differential; Future     Return in about 6 months (around 08/17/2024) for labs 1 week before.     Subjective       Patient's complete Health Risk Assessment and screening values have been reviewed and are found in Flowsheets. The following problems were reviewed today and where indicated follow up appointments were made and/or referrals ordered.    Positive Risk Factor Screenings with Interventions:    Fall Risk:  Do you feel unsteady or are you worried about falling? : (!) yes  2 or more falls in past year?: (!) yes  Fall with injury in past year?: no     Interventions:    Reviewed medications, home hazards, visual acuity, and co-morbidities that can increase risk for falls             Inactivity:  On average, how many days per week do you engage in moderate to strenuous exercise (like a brisk walk)?: 2 days (!) Abnormal  On average, how many minutes do you engage in exercise at this level?: 30 min  Interventions:  Patient encouraged to walk for exercise    Poor Eating Habits/Diet:  Do you eat balanced/healthy meals regularly?: (!) No  Interventions:  Patient continue to work on trying to increase protein in her diet.  Her daughter is with her today will help          Advanced Directives:  Do you have a Living Will?: (!) No    Intervention:  has NO advanced directive - not interested in  additional information                     Objective   Vitals:    02/15/24 1443   BP: 124/60   BP Site: Right Upper Arm   Patient Position: Sitting   BP Cuff Size: Large Adult   Pulse: 91   Resp: 20   Temp: 98.2 F (36.8 C)   TempSrc: Temporal   SpO2: 95%   Weight: 71.7 kg (158 lb)   Height: 1.575 m (5\' 2" )      Body mass index is 28.9 kg/m.                    Allergies   Allergen Reactions    Aspirin Other (See Comments)     GI distress,     Diphenhydramine Other (See Comments)     Muscle jerking    Pollen Extract      Other reaction(s): Not Reported This Time     Prior to Visit Medications    Medication Sig Taking? Authorizing Provider   amLODIPine (NORVASC) 5 MG tablet take 1 tablet by mouth once daily Yes Ruqayya Ventress, Toniann Fail,  MD   montelukast (SINGULAIR) 10 MG tablet TAKE 1 TABLET EVERY DAY Yes Renso Swett, Toniann Fail, MD   atorvastatin (LIPITOR) 40 MG tablet TAKE 1 TABLET EVERY DAY (NEED MD APPOINTMENT) Yes Sherene Plancarte, Toniann Fail, MD   spironolactone (ALDACTONE) 25 MG tablet Take 1 tablet by mouth daily Yes [provider]   gabapentin (NEURONTIN) 300 MG capsule TAKE 1 CAPSULE THREE TIMES DAILY. MAX DAILY AMOUNT: 900MG  Yes Rohith Fauth, Toniann Fail, MD   levothyroxine (SYNTHROID) 88 MCG tablet Take 1 tablet by mouth daily Yes Dayshawn Irizarry, Toniann Fail, MD   valsartan (DIOVAN) 160 MG tablet Take 1 tablet by mouth daily Yes Mattalynn Crandle, Toniann Fail, MD   vitamin C (ASCORBIC ACID) 500 MG tablet Take 1 tablet by mouth daily Yes [provider]   Multiple Vitamins-Minerals (MULTIVITAMIN ADULTS 50+ PO) Take 1 tablet by mouth daily Yes [provider]   Misc Natural Products (NEURIVA PO) Take 1 tablet by mouth in the morning and at bedtime Yes [provider]   NONFORMULARY Take 1 tablet by mouth every evening Viviscal -1 tablet every evening for hair and scalp Yes [provider]   potassium chloride (KLOR-CON M) 10 MEQ extended release tablet TAKE 1 TABLET EVERY DAY Yes Venda Rodes, MD   albuterol sulfate HFA  (PROVENTIL;VENTOLIN;PROAIR) 108 (90 Base) MCG/ACT inhaler Inhale 2 puffs into the lungs every 4 hours as needed for Wheezing or Shortness of Breath Yes Khylen Riolo, Toniann Fail, MD   ZINC PO Take 1 tablet by mouth daily Yes Automatic Reconciliation, Ar   latanoprost (XALATAN) 0.005 % ophthalmic solution Place 1 drop into both eyes nightly Yes Automatic Reconciliation, Ar   diclofenac sodium (VOLTAREN) 1 % GEL Apply 4 g topically 4 times daily  Patient not taking: Reported on 07/02/2023  Heloise Beecham, DO   vitamin D 25 MCG (1000 UT) CAPS Take 1 capsule by mouth daily  Patient not taking: Reported on 02/15/2024  [provider]   acetaminophen (TYLENOL) 500 MG tablet Take 2 tablets by mouth in the morning and 2 tablets at noon and 2 tablets in the evening.  Duplain, Kaylah, PA-C   cyanocobalamin 1000 MCG tablet Take 1 tablet by mouth every evening  Patient not taking: Reported on 02/15/2024  [provider]   iron dextran complex (INFED) 50 MG/ML injection Infuse 19.5 mLs intravenously Pt gets these every other month  Patient not taking: Reported on 02/15/2024  Automatic Reconciliation, Ar   lidocaine (LIDODERM) 5 % Place 1 patch onto the skin every 24 hours 12 hours on and 12 hours off  Patient not taking: Reported on 02/15/2024  Automatic Reconciliation, Ar       CareTeam (Including outside providers/suppliers regularly involved in providing care):   Patient Care Team:  Marvia Pickles, MD as PCP - General  Shawnie Pons Toniann Fail, MD as PCP - Empaneled Provider  Bichu, Clovis Cao, MD (Nephrology)  Georgiann Cocker, MD (Nephrology)  Derek Jack, RN as Ambulatory Care Manager     Recommendations for Preventive Services Due: see orders and patient instructions/AVS.  Recommended screening schedule for the next 5-10 years is provided to the patient in written form: see Patient Instructions/AVS.     Reviewed and updated this visit:  Tobacco  Allergies  Meds  Problems  Med Hx  Surg Hx  Fam Hx  Sexual   Hx

## 2024-02-15 NOTE — Progress Notes (Signed)
 Elaine [name] is a 73 y.o.Elaine [name]  female and presents with Hypertension, Cholesterol Problem, Thyroid Problem (/), and Vitamin D Deficiency      SUBJECTIVE:  [name] HTN is well controlled on   Norvasc 5  mg, Diovan 160 mg and aldactone 25 mg .  [name] high cholesterol has been borderline controlled on Lipitor 40 mg. Pt  denies any side effects like myalgias. Cholesterol has improved with pt stop drinking sodas.  Pt has been controlling her prediabetes with diet and weight loss.  Patient daughter has been helping her to be more compliant with taking her medications.  .     Thyroid Review:  Patient is seen for followup of hypothyroidism.   Thyroid ROS: denies fatigue, weight changes, heat/cold intolerance, bowel/skin changes or CVS symptoms. 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  Patient's TSH level is better but still too high on Synthroid 88 mcg daily. Pt is still missing some doses and her daughter will work with her to try and be more consistent.    Patient has history of vitamin D deficiency and  continues vitamin D about 2000 units/day.    Patient has sleep apnea and is using a CPAP machine and followed by sleep specialist    She continues to have fungal infection in her toenails which is slowly improving with Lamisil.     Patient has asthma that is controlled on Singulair and  trelegy Ellipta.  Patient is followed by pulmonary who also sees her for sleep apnea.      Patient has chronic diastolic heart failure which is stable and followed by cardiology    Patient has an atrophic right kidney which may have been going on for several years if not since birth.  Duplex renal ultrasound shows Eleveated velocity identified in the mid left renal artery in the presence of tortousity. No hemodynamically significant stenosis involving the right renal artery.  Patient is followed by nephrology.  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  She has had EGD/colonoscopy and pill endoscopy in the past.  Patient unable to use aspirin due to this reason. Last colonoscopy was 2021 with 7 year f/u 2027    Patient has a history of Chiari malformation type I and has been referred to neurology.    Respiratory ROS: negative for - shortness of breath  Cardiovascular ROS: negative for - chest pain      Current Outpatient Medications   Medication Sig    amLODIPine (NORVASC) 5 MG tablet take 1 tablet by mouth once daily    montelukast (SINGULAIR) 10 MG tablet TAKE 1 TABLET EVERY DAY    atorvastatin (LIPITOR) 40 MG tablet TAKE 1 TABLET EVERY DAY (NEED MD APPOINTMENT)    spironolactone (ALDACTONE) 25 MG tablet Take 1 tablet by mouth daily    gabapentin (NEURONTIN) 300 MG capsule TAKE 1 CAPSULE THREE TIMES DAILY. MAX DAILY AMOUNT: 900MG     levothyroxine (SYNTHROID) 88 MCG tablet Take 1 tablet by mouth daily    valsartan (DIOVAN) 160 MG tablet Take 1 tablet by mouth daily    vitamin C (ASCORBIC ACID) 500 MG tablet Take 1 tablet by mouth daily    Multiple Vitamins-Minerals (MULTIVITAMIN ADULTS 50+ PO) Take 1 tablet by mouth daily  Misc Natural Products (NEURIVA PO) Take 1 tablet by mouth in the morning and at bedtime    NONFORMULARY Take 1 tablet by mouth every evening Viviscal -1 tablet every evening for hair and scalp    potassium chloride (KLOR-CON M) 10 MEQ extended release tablet TAKE 1 TABLET EVERY DAY    albuterol sulfate HFA (PROVENTIL;VENTOLIN;PROAIR) 108 (90 Base) MCG/ACT inhaler Inhale 2 puffs into the lungs every 4 hours as needed for Wheezing or Shortness of Breath    ZINC PO Take 1 tablet by mouth daily    latanoprost (XALATAN) 0.005 % ophthalmic solution Place 1 drop into both eyes nightly    diclofenac sodium (VOLTAREN) 1 % GEL Apply 4 g topically 4 times daily (Patient not taking: Reported  on 07/02/2023)    vitamin D 25 MCG (1000 UT) CAPS Take 1 capsule by mouth daily (Patient not taking: Reported on 02/15/2024)    acetaminophen (TYLENOL) 500 MG tablet Take 2 tablets by mouth in the morning and 2 tablets at noon and 2 tablets in the evening.    cyanocobalamin 1000 MCG tablet Take 1 tablet by mouth every evening (Patient not taking: Reported on 02/15/2024)    iron dextran complex (INFED) 50 MG/ML injection Infuse 19.5 mLs intravenously Pt gets these every other month (Patient not taking: Reported on 02/15/2024)    lidocaine (LIDODERM) 5 % Place 1 patch onto the skin every 24 hours 12 hours on and 12 hours off (Patient not taking: Reported on 02/15/2024)     No current facility-administered medications for this visit.           OBJECTIVE:  alert, well appearing, and in no distress    Visit Vitals  BP 124/60 (BP Site: Right Upper Arm, Patient Position: Sitting, BP Cuff Size: Large Adult)   Pulse 91   Temp 98.2 F (36.8 C) (Temporal)   Resp 20   Ht 1.575 m (5\' 2" )   Wt 71.7 kg (158 lb)   SpO2 95%   BMI 28.90 kg/m          well developed and well nourished  Chest - clear to auscultation, no wheezes, rales or rhonchi, symmetric air entry  Heart - normal rate, regular rhythm, normal S1, S2, no murmurs, rubs, clicks or gallops  Extremities - peripheral pulses normal, no pedal edema, no clubbing or cyanosis    CMP:    Lab Results   Component Value Date/Time    NA 141 02/03/2024 07:35 AM    K 3.7 02/03/2024 07:35 AM    CL 106 02/03/2024 07:35 AM    CO2 33 02/03/2024 07:35 AM    BUN 5 02/03/2024 07:35 AM    CREATININE 0.57 02/03/2024 07:35 AM    GFRAA >60 05/26/2021 08:05 AM    AGRATIO 1.7 03/09/2023 12:00 AM    LABGLOM >90 02/03/2024 07:35 AM    LABGLOM 96 03/09/2023 12:00 AM    GLUCOSE 89 02/03/2024 07:35 AM    GLUCOSE 97 09/24/2023 12:00 AM    LABALBU 4.1 03/09/2023 12:00 AM    CALCIUM 9.3 02/03/2024 07:35 AM    BILITOT 1.0 02/03/2024 07:35 AM    ALKPHOS 85 02/03/2024 07:35 AM    ALKPHOS 87 09/24/2023 12:00 AM     AST 15 02/03/2024 07:35 AM    ALT 19 02/03/2024 07:35 AM     HgBA1c:    Lab Results   Component Value Date/Time    LABA1C 5.1 02/03/2024 07:35 AM     FLP:  Lab Results   Component Value Date/Time    TRIG 71 02/03/2024 07:35 AM    HDL 65 02/03/2024 07:35 AM     TSH:    Lab Results   Component Value Date/Time    TSH 6.77 02/03/2024 07:35 AM         Discussed the patient's BMI with her.  The BMI follow up plan is as follows: I have counseled this patient on diet and exercise regimens.        Assessment/Plan    ICD-10-CM    1. Medicare annual wellness visit, subsequent  Z00.00       2. Essential (primary) hypertension  I10 Well controlled on multiple medications Comprehensive Metabolic Panel      3. Chronic diastolic congestive heart failure (HCC)  I50.32 Stable on Diovan and Lasix and patient followed by cardiology.      4. High cholesterol  E78.00 Well-controlled Lipitor 40 mg daily lipid Panel      5. Postprocedural hypothyroidism  E89.0 TSH level improving with patient taking Synthroid 88 mcg more consistently TSH + Free T4 Panel      6. Vitamin D deficiency  E55.9 Well-controlled vitamin D 2000 units/day Vitamin D 25 Hydroxy      7. Prediabetes  R73.03 Improving with diet and cutting out sodas Hemoglobin A1C      8. Iron deficiency anemia due to chronic blood loss  D50.0 Stable patient currently off of iron and continuing to be followed by hematology.  CBC with Auto Differential           Follow-up and Dispositions    Ms.Return in about 6 months (around 08/17/2024) for labs 1 week before.             Reviewed plan of care. Patient has provided input and agrees with goals.

## 2024-02-15 NOTE — Progress Notes (Signed)
 Elaine [name] presents today for   Chief Complaint   Patient presents with    Hypertension    Cholesterol Problem    Blood Sugar Problem     3 month follow up     Medicare AWV           "Have you been to the ER, urgent care clinic since your last visit?  Hospitalized since your last visit?"    NO    "Have you seen or consulted any other health care providers outside of [facility] since your last visit?"    NO

## 2024-02-18 DIAGNOSIS — Z85038 Personal history of other malignant neoplasm of large intestine: Secondary | ICD-10-CM | POA: Diagnosis not present

## 2024-02-18 DIAGNOSIS — I10 Essential (primary) hypertension: Secondary | ICD-10-CM | POA: Diagnosis not present

## 2024-02-18 DIAGNOSIS — M109 Gout, unspecified: Secondary | ICD-10-CM | POA: Diagnosis not present

## 2024-02-18 DIAGNOSIS — Z20822 Contact with and (suspected) exposure to covid-19: Secondary | ICD-10-CM | POA: Diagnosis not present

## 2024-02-18 DIAGNOSIS — E860 Dehydration: Secondary | ICD-10-CM | POA: Diagnosis not present

## 2024-02-18 DIAGNOSIS — E785 Hyperlipidemia, unspecified: Secondary | ICD-10-CM | POA: Diagnosis not present

## 2024-02-18 DIAGNOSIS — R112 Nausea with vomiting, unspecified: Secondary | ICD-10-CM | POA: Diagnosis not present

## 2024-02-18 DIAGNOSIS — R197 Diarrhea, unspecified: Secondary | ICD-10-CM | POA: Diagnosis not present

## 2024-02-18 DIAGNOSIS — Z885 Allergy status to narcotic agent status: Secondary | ICD-10-CM | POA: Diagnosis not present

## 2024-02-18 DIAGNOSIS — R000 Tachycardia, unspecified: Secondary | ICD-10-CM | POA: Diagnosis not present

## 2024-02-18 DIAGNOSIS — E876 Hypokalemia: Secondary | ICD-10-CM | POA: Diagnosis not present

## 2024-02-18 DIAGNOSIS — E119 Type 2 diabetes mellitus without complications: Secondary | ICD-10-CM | POA: Diagnosis not present

## 2024-02-21 ENCOUNTER — Ambulatory Visit: Payer: MEDICARE | Attending: Specialist | Primary: Internal Medicine

## 2024-03-07 ENCOUNTER — Telehealth

## 2024-03-07 ENCOUNTER — Ambulatory Visit: Admit: 2024-03-07 | Discharge: 2024-03-07 | Payer: MEDICARE | Attending: Internal Medicine | Primary: Internal Medicine

## 2024-03-07 VITALS — BP 157/69 | HR 78 | Temp 98.10000°F | Resp 18 | Ht 62.0 in | Wt 153.0 lb

## 2024-03-07 DIAGNOSIS — I10 Essential (primary) hypertension: Secondary | ICD-10-CM

## 2024-03-07 MED ORDER — VALSARTAN 320 MG PO TABS
320 | ORAL_TABLET | Freq: Every day | ORAL | 3 refills | 90.00000 days | Status: DC
Start: 2024-03-07 — End: 2024-05-03

## 2024-03-07 NOTE — Telephone Encounter (Signed)
 Referral Placed

## 2024-03-07 NOTE — Progress Notes (Signed)
 Elaine [name] presents today for   Chief Complaint   Patient presents with    Pre-op Exam     Dr. Algernon Huxley on 03/21/2024 at [facility] outpatient surgery  Cataract extraction with a standard lens implant Top/MAC right eye           "Have you been to the ER, urgent care clinic since your last visit?  Hospitalized since your last visit?"    NO    "Have you seen or consulted any other health care providers outside of [facility] since your last visit?"    Yes, Dr. Algernon Huxley.

## 2024-03-07 NOTE — Telephone Encounter (Signed)
 Pt wanted to know if a neurologist can be recommended in Pt insurance network   For chiari malformation   Pt thinks this is getting worse    Please advise.     Call back req

## 2024-03-07 NOTE — Progress Notes (Signed)
 Preoperative Evaluation    Date of Exam: 03/07/2024    Elaine [name] is a 73 y.o. female (DOB:29-Dec-1950) who presents for preoperative evaluation.   Procedure/Surgery:Phaco w IOL- Cataract Extraction with standard Lens Implant right eye   Date of Procedure/Surgery: 03/21/2024  Surgeon: Dr Gardenia Phlegm  Hospital/Surgical Facility: [facility]     Primary Physician: Marvia Pickles, MD    HPI: Patient presents for medical clearance for surgery above .  [name] HTN is uncontrolled on  Diovan 160 mg and Norvasc 5 mg. Will increase to Diovan 320 mg for better BP control.   [name] high cholesterol has been fairly well controlled on Lipitor 40 mg. Pt  denies any side effects like myalgias. .  Pt has been controlling her prediabetes with diet and weight loss.      Thyroid Review:  Patient is seen for followup of hypothyroidism.   Thyroid ROS: denies fatigue, weight changes, heat/cold intolerance, bowel/skin changes or CVS symptoms. 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  Her TSH is stable on Synthroid 100 mcg daily on a regular basis. .          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  She has had EGD/colonoscopy and pill endoscopy in the past     [name] vit D level is  good on vit D 5000 units/day.      Patient has sleep apnea and is using a CPAP machine and followed by sleep specialist     Patient has asthma that is controlled on Singulair and  trelegy Ellipta.  Patient is followed by pulmonary who also sees her for sleep apnea.     Patient has chronic diastolic heart failure which is stable and followed by cardiology     Patient overall is at  low risk for this low risk surgery and is medically cleared to proceed.      Medical History:     Past Medical History:   Diagnosis Date    Anemia     Asthma      Colon polyp     DDD (degenerative disc disease) 02/15/2009    Depression 12/18/2011    DVT (deep venous thrombosis) (HCC) 10/23/2009    Left leg:  No DVT.      Essential hypertension, benign     History of blood transfusion     History of echocardiogram 03/20/2004    EF 70%.  Borderline DDfx.  No significant valvular pathology.    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  Neg EKG on pharm stress test.    Thyroid disease  hypothyroidism     Allergies:     Allergies   Allergen Reactions    Aspirin Other (See Comments)     GI distress,     Diphenhydramine Other (See Comments)     Muscle jerking    Pollen Extract      Other reaction(s): Not Reported This Time      Medications:     Current Outpatient Medications   Medication Sig    valsartan (DIOVAN) 320 MG tablet Take 1 tablet by mouth daily    amLODIPine (NORVASC) 5 MG tablet take 1 tablet by mouth once daily    montelukast (SINGULAIR) 10 MG tablet TAKE 1 TABLET EVERY DAY    atorvastatin (LIPITOR) 40 MG tablet TAKE 1 TABLET EVERY DAY (NEED MD APPOINTMENT)    levothyroxine (SYNTHROID) 88 MCG tablet Take 1 tablet by mouth daily    vitamin C (ASCORBIC ACID) 500 MG tablet Take 1 tablet by mouth daily    Multiple Vitamins-Minerals (MULTIVITAMIN ADULTS 50+ PO) Take 1 tablet by mouth daily    Misc Natural Products (NEURIVA PO) Take 1 tablet by mouth in the morning and at bedtime    NONFORMULARY Take 1  tablet by mouth every evening Viviscal -1 tablet every evening for hair and scalp    potassium chloride (KLOR-CON M) 10 MEQ extended release tablet TAKE 1 TABLET EVERY DAY    albuterol sulfate HFA (PROVENTIL;VENTOLIN;PROAIR) 108 (90 Base) MCG/ACT inhaler Inhale 2 puffs into the lungs every 4 hours as needed for Wheezing or Shortness of Breath    ZINC PO Take 1 tablet by mouth daily    latanoprost (XALATAN) 0.005 % ophthalmic solution Place 1 drop into both eyes nightly    gabapentin (NEURONTIN) 300 MG capsule TAKE 1 CAPSULE THREE TIMES DAILY. MAX DAILY AMOUNT: 900MG     diclofenac sodium (VOLTAREN) 1 % GEL Apply 4 g topically 4 times daily (Patient not taking: Reported on 07/02/2023)    vitamin D 25 MCG (1000 UT) CAPS Take 1 capsule by mouth daily (Patient not taking: Reported on 07/02/2023)    cyanocobalamin 1000 MCG tablet Take 1 tablet by mouth every evening (Patient not taking: Reported on 07/02/2023)    lidocaine (LIDODERM) 5 % Place 1 patch onto the skin every 24 hours 12 hours on and 12 hours off (Patient not taking: Reported on 03/07/2024)     No current facility-administered medications for this visit.     Surgical History:     Past Surgical History:   Procedure Laterality Date    BREAST BIOPSY      BREAST SURGERY      COLONOSCOPY N/A 04/25/2020    SCREENING COLONOSCOPY performed by Raynelle Jan, MD at [facility] ENDOSCOPY    COLONOSCOPY  02-24-12    normal (Dr. Adelene Idler)    COLONOSCOPY FLX DX [facility]  02-2007    +polyp(tubular adenoma); Dr Adelene Idler    HERNIA REPAIR      3x    HYSTERECTOMY (CERVIX STATUS UNKNOWN)      NEUROLOGICAL SURGERY  03-16-2009    s/p ACD & Fusion (Dr.P.Gurtner)    TOTAL HIP ARTHROPLASTY Left 01/08/2023    LEFT HIP TOTAL ARTHROPLASTY LATERAL APPROACH performed by Heloise Beecham, DO at [facility] MAIN OR    TUBAL LIGATION      UPPER GASTROINTESTINAL ENDOSCOPY N/A 06/12/2022    ENTEROSCOPY PUSH DIAGNOSTIC w/small intestinal bipolar cautery of multiple AVMs performed by Raynelle Jan, MD at  [facility] ENDOSCOPY  Social History:     Social History     Socioeconomic History    Marital status: Divorced     Spouse name: None    Number of children: None    Years of education: None    Highest education level: None   Tobacco Use    Smoking status: Former     Current packs/day: 0.00     Average packs/day: 1 pack/day for 27.0 years (27.0 ttl pk-yrs)     Types: Cigarettes     Start date: 10/16/1977     Quit date: 04/25/2004     Years since quitting: 19.8     Passive exposure: Never    Smokeless tobacco: Never   Vaping Use    Vaping status: Never Used   Substance and Sexual Activity    Alcohol use: No    Drug use: Never    Sexual activity: Not Currently     Social Drivers of Health     Financial Resource Strain: Low Risk  (01/04/2023)    Overall Financial Resource Strain (CARDIA)     Difficulty of Paying Living Expenses: Not hard at all   Food Insecurity: No Food Insecurity (02/15/2024)    Hunger Vital Sign     Worried About Running Out of Food in the Last Year: Never true     Ran Out of Food in the Last Year: Never true   Transportation Needs: No Transportation Needs (02/15/2024)    PRAPARE - Therapist, art (Medical): No     Lack of Transportation (Non-Medical): No   Physical Activity: Insufficiently Active (02/15/2024)    Exercise Vital Sign     Days of Exercise per Week: 2 days     Minutes of Exercise per Session: 30 min   Stress: No Stress Concern Present (01/04/2023)    [employer] of Occupational Health - Occupational Stress Questionnaire     Feeling of Stress : Not at all   Social Connections: Feeling Socially Integrated (02/26/2023)    OASIS D0700: Social Isolation     Frequency of experiencing loneliness or isolation: Never   Intimate Partner Violence: Not At Risk (01/04/2023)    Humiliation, Afraid, Rape, and Kick questionnaire     Fear of Current or Ex-Partner: No     Emotionally Abused: No     Physically Abused: No     Sexually Abused: No   Housing Stability: Low Risk   (02/15/2024)    Housing Stability Vital Sign     Unable to Pay for Housing in the Last Year: No     Number of Times Moved in the Last Year: 0     Homeless in the Last Year: No       Recent use of: No recent use of aspirin (ASA), NSAIDS or steroids    Anesthesia Complications: None  History of abnormal bleeding : None      REVIEW OF SYSTEMS:  Respiratory: negative for dyspnea on exertion  Cardiovascular: negative for chest pain    EXAM:   BP (!) 157/69   Pulse 78   Temp 98.1 F (36.7 C) (Temporal)   Resp 18   Ht 1.575 m (5\' 2" )   Wt 69.4 kg (153 lb)   SpO2 99%   BMI 27.98 kg/m   Chest - clear to auscultation, no wheezes, rales or rhonchi, symmetric air entry  Heart - normal rate, regular rhythm, normal S1, S2, no murmurs, rubs, clicks or gallops  Extremities -  peripheral pulses normal, no pedal edema, no clubbing or cyanosis           IMPRESSION:         ICD-10-CM    1. Essential hypertension  I10 Uncontrolled. Will increase to valsartan (DIOVAN) 320 MG tablet and continue Norvasc 5 mg daily.       2. High cholesterol  E78.00 Well controlled on Lipitor 40 mg       3. Postprocedural hypothyroidism  E89.0 Well controlled on synthroid 100 mcg/day       4. Iron deficiency anemia due to chronic blood loss  D50.0 Due to AVMs an currently stable with pt managed by hematology       5. 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

## 2024-03-10 ENCOUNTER — Ambulatory Visit: Admit: 2024-03-10 | Discharge: 2024-03-10 | Payer: MEDICARE | Attending: Family | Primary: Internal Medicine

## 2024-03-10 ENCOUNTER — Ambulatory Visit: Payer: MEDICARE | Attending: Family | Primary: Internal Medicine

## 2024-03-10 VITALS — BP 129/58 | HR 92 | Ht 62.0 in | Wt 158.0 lb

## 2024-03-10 DIAGNOSIS — I10 Essential (primary) hypertension: Secondary | ICD-10-CM

## 2024-03-10 NOTE — Progress Notes (Signed)
 1. Have you been to the ER, urgent care clinic since your last visit?  Hospitalized since your last visit?No    2. Have you seen or consulted any other health care providers outside of the [facility] since your last visit?  Include any pap smears or colon screening. No

## 2024-03-10 NOTE — Progress Notes (Signed)
 Elaine [name] is a 73 y.o. year old female.    Follow-up of CHF, hypertension, hyperlipidemia and obesity    05/2018 - Mild SOB today related to asthma - improved with inhaler use.  8/21 intermittent wheezing and shortness of breath which is improved with inhalers.    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  Denies chest pain, shortness of breath edema dizziness or palpitations.  Status post IV iron but no blood transfusion was given.  Blood pressure was too low and she had stopped medications for blood pressure.  Has lost 20 pounds in last 3 weeks.  03/10/2024 seen for preop evaluation she is anticipating cataract surgery with April.  She reports overall is doing well denies chest pain shortness of breath palpitations or edema      Review of Systems   Constitutional: Negative.    HENT: Negative.     Eyes: Negative.    Respiratory: Negative.     Cardiovascular: Negative.    Gastrointestinal:  Negative for blood in stool.   Endocrine: Negative.    Genitourinary: Negative.    Musculoskeletal: Negative.    Neurological: Negative.    Psychiatric/Behavioral: Negative.     All other systems reviewed and are negative.        Physical Exam  Vitals and nursing note reviewed.   Constitutional:       Appearance: Normal appearance.   HENT:      Head: Normocephalic and atraumatic.      Nose: Nose normal.   Eyes:      Conjunctiva/sclera: Conjunctivae normal.   Cardiovascular:      Rate and Rhythm: Normal rate and regular rhythm.      Pulses: Normal pulses.      Heart sounds: Normal heart sounds.   Pulmonary:      Effort: Pulmonary effort is normal.      Breath sounds: Normal breath sounds.   Abdominal:      General: Bowel sounds are normal.      Palpations: Abdomen is soft.   Musculoskeletal:         General: Normal range of motion.      Right lower leg: No edema.      Left lower leg: No edema.    Skin:     General: Skin is warm and dry.   Neurological:      General: No focal deficit present.      Mental Status: She is alert and oriented to person, place, and time.   Psychiatric:         Mood and Affect: Mood normal.         Behavior: Behavior normal.        Allergies   Allergen Reactions    Aspirin Other (See Comments)     GI distress,     Diphenhydramine Other (See Comments)     Muscle jerking    Oxycodone     Pollen Extract      Other reaction(s): Not Reported This Time       Past Medical History:   Diagnosis Date    Anemia     Asthma     Colon polyp     DDD (degenerative disc disease) 02/15/2009    Depression 12/18/2011    DVT (deep venous thrombosis) (HCC) 10/23/2009    Left leg:  No DVT.  Essential hypertension, benign     History of blood transfusion     History of echocardiogram 03/20/2004    EF 70%.  Borderline DDfx.  No significant valvular pathology.    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  Neg EKG on pharm stress test.    Thyroid disease     hypothyroidism       Family History   Problem Relation Age of Onset    Hypertension Sister         x3    Hypertension Mother     Cancer Mother     Heart Surgery Sister     Heart Disease Sister     Hypertension Paternal Uncle     Cancer Maternal Aunt         breast    Breast Cancer Maternal Aunt     Glaucoma Son     Heart Defect Son     Diabetes Paternal Uncle     Heart Attack Sister         Social History     Tobacco Use    Smoking status: Former     Current packs/day: 0.00     Average packs/day: 1 pack/day for 27.0 years (27.0 ttl pk-yrs)     Types: Cigarettes     Start date: 10/16/1977     Quit date: 04/25/2004     Years since quitting: 19.8     Passive exposure: Never    Smokeless tobacco: Never   Vaping Use    Vaping status: Never Used   Substance Use Topics    Alcohol use: No    Drug use: Never        Current Outpatient Medications   Medication Sig Dispense Refill    valsartan (DIOVAN) 320 MG tablet Take 1 tablet by mouth daily 90 tablet 3    amLODIPine (NORVASC) 5 MG tablet take 1 tablet by mouth once daily 90 tablet 3    montelukast (SINGULAIR) 10 MG tablet TAKE 1 TABLET EVERY DAY 90 tablet 3    atorvastatin (LIPITOR) 40 MG tablet TAKE 1 TABLET EVERY DAY (NEED MD APPOINTMENT) 90 tablet 3    gabapentin (NEURONTIN) 300 MG capsule TAKE 1 CAPSULE THREE TIMES DAILY. MAX DAILY AMOUNT: 900MG  270 capsule 1    levothyroxine (SYNTHROID) 88 MCG tablet Take 1 tablet by mouth daily 90 tablet 3    vitamin D 25 MCG (1000 UT) CAPS Take 1 capsule by mouth daily      vitamin C (ASCORBIC ACID) 500 MG tablet Take 1 tablet by mouth daily      Multiple Vitamins-Minerals (MULTIVITAMIN ADULTS 50+ PO) Take 1 tablet by mouth daily      Misc Natural Products (NEURIVA PO) Take 1 tablet by mouth in the morning and at bedtime  NONFORMULARY Take 1 tablet by mouth every evening Viviscal -1 tablet every evening for hair and scalp      potassium chloride (KLOR-CON M) 10 MEQ extended release tablet TAKE 1 TABLET EVERY DAY 90 tablet 3    albuterol sulfate HFA (PROVENTIL;VENTOLIN;PROAIR) 108 (90 Base) MCG/ACT inhaler Inhale 2 puffs into the lungs every 4 hours as needed for Wheezing or Shortness of Breath 18 g 5    cyanocobalamin 1000 MCG tablet Take 1 tablet by mouth every evening      ZINC PO Take 1 tablet by mouth daily      latanoprost (XALATAN) 0.005 % ophthalmic solution Place 1 drop into both eyes nightly       lidocaine (LIDODERM) 5 % Place 1 patch onto the skin every 24 hours 12 hours on and 12 hours off      diclofenac sodium (VOLTAREN) 1 % GEL Apply 4 g topically 4 times daily (Patient not taking: Reported on 03/10/2024) 350 g 5     No current facility-administered medications for this visit.        Past Surgical History:   Procedure Laterality Date    BREAST BIOPSY      BREAST SURGERY      COLONOSCOPY N/A 04/25/2020    SCREENING COLONOSCOPY performed by Raynelle Jan, MD at [facility] ENDOSCOPY    COLONOSCOPY  02-24-12    normal (Dr. Adelene Idler)    COLONOSCOPY FLX DX W/COLLJ [facility] WHEN PFRMD  02-2007    +polyp(tubular adenoma); Dr Adelene Idler    HERNIA REPAIR      3x    HYSTERECTOMY (CERVIX STATUS UNKNOWN)      NEUROLOGICAL SURGERY  03-16-2009    s/p ACD & Fusion (Dr.P.Gurtner)    TOTAL HIP ARTHROPLASTY Left 01/08/2023    LEFT HIP TOTAL ARTHROPLASTY LATERAL APPROACH performed by Heloise Beecham, DO at [facility] MAIN OR    TUBAL LIGATION      UPPER GASTROINTESTINAL ENDOSCOPY N/A 06/12/2022    ENTEROSCOPY PUSH DIAGNOSTIC w/small intestinal bipolar cautery of multiple AVMs performed by Raynelle Jan, MD at [facility] ENDOSCOPY       Vitals:    03/10/24 1133   BP: (!) 129/58   BP Site: Left Upper Arm   Patient Position: Sitting   BP Cuff Size: Medium Adult   Pulse: 92   SpO2: 97%   Weight: 71.7 kg (158 lb)   Height: 1.575 m (5\' 2" )          Diagnostic Studies:  I have reviewed the relevant tests done on the patient and show as follows  EKG tracings reviewed by me today.      Ms. Barretto has a reminder for a "due or due soon" health maintenance. I have asked that she contact her primary care provider for follow-up on this health maintenance.    1/18 Nuc Stress  Diagnosis:   Probably normal scan.    Evidence of a small mild fixed inferior wall defect noted from his nuclear study suggestive of tissue attenuation with normal wall motion in the area.    No reversible defect suggesting ischemia noted from his nuclear study.   Normal left ventricular size  and systolic function.   Low risk scan.     1/18 ECHO  SUMMARY:  Left ventricle: Systolic function was hyperdynamic. Ejection fraction was  estimated in the range of 70 % to 75 %. There were no regional wall motion  abnormalities. There was mild concentric hypertrophy. Doppler parameters  were consistent  with abnormal left ventricular relaxation (grade 1  diastolic dysfunction).    Mitral valve: There was mild annular calcification.      ASSESSMENT & PLAN    Lab Results   Component Value Date    CHOL 163 02/03/2024    CHOL 187 09/24/2023    CHOL 145 03/09/2023    CHOL 153 09/29/2022    CHOL 170 03/23/2022    CHOL 190 11/17/2021    CHOL 141 01/03/2021    CHOL 160 02/27/2020     Lab Results   Component Value Date    TRIG 71 02/03/2024    TRIG 98 09/24/2023    TRIG 66 03/09/2023    TRIG 101 09/29/2022    TRIG 102 03/23/2022    TRIG 117 11/17/2021    TRIG 76 01/03/2021    TRIG 90 02/27/2020     Lab Results   Component Value Date    HDL 65 (H) 02/03/2024    HDL 71 09/24/2023    HDL 61 03/09/2023    HDL 51 09/29/2022    HDL 54 03/23/2022    HDL 66 11/17/2021    HDL 52 01/03/2021    HDL 57 02/27/2020     No components found for: "LDLCHOLESTEROL", "LDLCALC"    Lab Results   Component Value Date    VLDL 14.2 02/03/2024    VLDL 17 09/24/2023    VLDL 13 03/09/2023    VLDL 19 09/29/2022    VLDL 19 03/23/2022    VLDL 21 11/17/2021    VLDL 15 01/03/2021    VLDL 17 02/27/2020           7/20 Stable CHF.  B/P is controlled.  Continue same medications.  NYHA II.  Weight diet and exercise discussed.    07/18/2020 CHF compensated.  Blood pressure is well controlled.  Lipids are better this year than last year.  She is trying to lose weight.  Mediterranean diet guidelines printed.  Exercise and physical therapy for the back encouraged.      07/04/2021 CHF is compensated NYHA class II.  Blood pressure is controlled well.  Since no dizziness, continue same medications.  Lipids are controlled well.  Diet weight and exercise discussed.   Mediterranean diet guidelines given.    Tyeesha was seen today for follow-up.    Diagnoses and all orders for this visit:    HTN (hypertension), benign  -     EKG 12 Lead    Chronic diastolic (congestive) heart failure    Pure hypercholesterolemia, unspecified    Obesity (BMI 30.0-34.9)    Pre-op evaluation      Pertinent laboratory and test data reviewed and discussed with patient.  See patient instructions also for other medical advice given    There are no discontinued medications.    Return to ER if any significant CP not relieved by s/l NTG, severe SOB, severe palpitations, loss of consciousness    06/19/2022 blood pressure is mildly elevated and she has not been taking her medications as it was low during GI bleed.  Will start low-dose plain valsartan at 80 mg a day and follow the home chart.  I also instructed the patient to skip the dose if SBP less than 120.  She should monitor the blood pressure and send it back to [place] in a few days.  CHF has clinically resolved as she has lost significant weight.  Recommend to eat healthy and try to not gain the weight again.  Mediterranean diet  guidelines printed again.  Lipids will need to be followed up in future after GI bleed stabilizes.  03/10/2024  Cardiac status is stable history of chronic heart failure with preserved ejection fraction.  Seen for preop evaluation in office EKG normal sinus rhythm patient appears euvolemic at this time we will continue current cardiac medications routine labs with PCP May proceed with cataract surgery with low cardiac risk

## 2024-03-16 ENCOUNTER — Ambulatory Visit: Payer: MEDICARE | Attending: Specialist | Primary: Internal Medicine

## 2024-04-05 DIAGNOSIS — C183 Malignant neoplasm of hepatic flexure: Secondary | ICD-10-CM | POA: Diagnosis not present

## 2024-04-05 DIAGNOSIS — D509 Iron deficiency anemia, unspecified: Secondary | ICD-10-CM | POA: Diagnosis not present

## 2024-04-05 DIAGNOSIS — J9601 Acute respiratory failure with hypoxia: Secondary | ICD-10-CM | POA: Diagnosis not present

## 2024-04-06 ENCOUNTER — Ambulatory Visit: Admit: 2024-04-06 | Payer: MEDICARE | Attending: Physician Assistant | Primary: Internal Medicine

## 2024-04-06 VITALS — Ht 62.0 in | Wt 158.0 lb

## 2024-04-06 DIAGNOSIS — I129 Hypertensive chronic kidney disease with stage 1 through stage 4 chronic kidney disease, or unspecified chronic kidney disease: Secondary | ICD-10-CM | POA: Diagnosis present

## 2024-04-06 DIAGNOSIS — N1831 Chronic kidney disease, stage 3a: Secondary | ICD-10-CM | POA: Diagnosis present

## 2024-04-06 DIAGNOSIS — E876 Hypokalemia: Secondary | ICD-10-CM | POA: Diagnosis not present

## 2024-04-06 DIAGNOSIS — K219 Gastro-esophageal reflux disease without esophagitis: Secondary | ICD-10-CM | POA: Diagnosis not present

## 2024-04-06 DIAGNOSIS — Z6833 Body mass index (BMI) 33.0-33.9, adult: Secondary | ICD-10-CM | POA: Diagnosis not present

## 2024-04-06 DIAGNOSIS — R079 Chest pain, unspecified: Secondary | ICD-10-CM | POA: Diagnosis not present

## 2024-04-06 DIAGNOSIS — D500 Iron deficiency anemia secondary to blood loss (chronic): Secondary | ICD-10-CM | POA: Diagnosis not present

## 2024-04-06 DIAGNOSIS — C183 Malignant neoplasm of hepatic flexure: Secondary | ICD-10-CM | POA: Diagnosis not present

## 2024-04-06 DIAGNOSIS — C787 Secondary malignant neoplasm of liver and intrahepatic bile duct: Secondary | ICD-10-CM | POA: Diagnosis present

## 2024-04-06 DIAGNOSIS — Z8719 Personal history of other diseases of the digestive system: Secondary | ICD-10-CM | POA: Diagnosis not present

## 2024-04-06 DIAGNOSIS — Z7901 Long term (current) use of anticoagulants: Secondary | ICD-10-CM | POA: Diagnosis not present

## 2024-04-06 DIAGNOSIS — E669 Obesity, unspecified: Secondary | ICD-10-CM | POA: Diagnosis present

## 2024-04-06 DIAGNOSIS — D509 Iron deficiency anemia, unspecified: Secondary | ICD-10-CM | POA: Diagnosis present

## 2024-04-06 DIAGNOSIS — R001 Bradycardia, unspecified: Secondary | ICD-10-CM | POA: Diagnosis not present

## 2024-04-06 DIAGNOSIS — R0602 Shortness of breath: Secondary | ICD-10-CM | POA: Diagnosis present

## 2024-04-06 DIAGNOSIS — R7989 Other specified abnormal findings of blood chemistry: Secondary | ICD-10-CM | POA: Diagnosis not present

## 2024-04-06 DIAGNOSIS — Z86718 Personal history of other venous thrombosis and embolism: Secondary | ICD-10-CM | POA: Diagnosis not present

## 2024-04-06 DIAGNOSIS — C182 Malignant neoplasm of ascending colon: Secondary | ICD-10-CM | POA: Diagnosis present

## 2024-04-06 DIAGNOSIS — Z95828 Presence of other vascular implants and grafts: Secondary | ICD-10-CM | POA: Diagnosis not present

## 2024-04-06 DIAGNOSIS — Z743 Need for continuous supervision: Secondary | ICD-10-CM | POA: Diagnosis not present

## 2024-04-06 DIAGNOSIS — Z86711 Personal history of pulmonary embolism: Secondary | ICD-10-CM | POA: Diagnosis not present

## 2024-04-06 DIAGNOSIS — I2699 Other pulmonary embolism without acute cor pulmonale: Secondary | ICD-10-CM | POA: Diagnosis not present

## 2024-04-06 DIAGNOSIS — Z885 Allergy status to narcotic agent status: Secondary | ICD-10-CM | POA: Diagnosis not present

## 2024-04-06 DIAGNOSIS — Z79899 Other long term (current) drug therapy: Secondary | ICD-10-CM | POA: Diagnosis not present

## 2024-04-06 DIAGNOSIS — I21A1 Myocardial infarction type 2: Secondary | ICD-10-CM | POA: Diagnosis present

## 2024-04-06 DIAGNOSIS — T451X5D Adverse effect of antineoplastic and immunosuppressive drugs, subsequent encounter: Secondary | ICD-10-CM | POA: Diagnosis not present

## 2024-04-06 DIAGNOSIS — R9431 Abnormal electrocardiogram [ECG] [EKG]: Secondary | ICD-10-CM | POA: Diagnosis not present

## 2024-04-06 DIAGNOSIS — T451X5A Adverse effect of antineoplastic and immunosuppressive drugs, initial encounter: Secondary | ICD-10-CM | POA: Diagnosis not present

## 2024-04-06 DIAGNOSIS — E785 Hyperlipidemia, unspecified: Secondary | ICD-10-CM | POA: Diagnosis present

## 2024-04-06 DIAGNOSIS — E1122 Type 2 diabetes mellitus with diabetic chronic kidney disease: Secondary | ICD-10-CM | POA: Diagnosis present

## 2024-04-06 DIAGNOSIS — Z1152 Encounter for screening for COVID-19: Secondary | ICD-10-CM | POA: Diagnosis not present

## 2024-04-06 DIAGNOSIS — Z7984 Long term (current) use of oral hypoglycemic drugs: Secondary | ICD-10-CM | POA: Diagnosis not present

## 2024-04-06 DIAGNOSIS — R531 Weakness: Secondary | ICD-10-CM | POA: Diagnosis not present

## 2024-04-06 DIAGNOSIS — J9601 Acute respiratory failure with hypoxia: Secondary | ICD-10-CM | POA: Diagnosis not present

## 2024-04-06 DIAGNOSIS — M109 Gout, unspecified: Secondary | ICD-10-CM | POA: Diagnosis present

## 2024-04-06 DIAGNOSIS — R350 Frequency of micturition: Secondary | ICD-10-CM

## 2024-04-06 LAB — AMB POC URINALYSIS DIP STICK AUTO W/O MICRO
Bilirubin, Urine, POC: NEGATIVE
Blood, Urine, POC: NEGATIVE
Glucose, Urine, POC: NEGATIVE
Ketones, Urine, POC: NEGATIVE
Nitrite, Urine, POC: NEGATIVE
Protein, Urine, POC: NEGATIVE
Specific Gravity, Urine, POC: 1.015 (ref 1.001–1.035)
Urobilinogen, POC: NORMAL
pH, Urine, POC: 6.5 (ref 4.6–8.0)

## 2024-04-06 LAB — AMB POC PVR, MEAS,POST-VOID RES,US,NON-IMAGING: PVR, POC: 0 cc

## 2024-04-06 NOTE — Progress Notes (Signed)
 Dr. Toya Friar is the supervising physician today.    Elaine [name]  1951-02-17     Diagnosis Orders   1. Kidney stones  AMB POC URINALYSIS DIP STICK AUTO W/O MICRO    AMB POC PVR, MEAS,POST-VOID RES,US ,NON-IMAGING      2. Urinary frequency  AMB POC URINALYSIS DIP STICK AUTO W/O MICRO    AMB POC PVR, MEAS,POST-VOID RES,US ,NON-IMAGING      3. Renal atrophy  AMB POC URINALYSIS DIP STICK AUTO W/O MICRO    AMB POC PVR, MEAS,POST-VOID RES,US ,NON-IMAGING        Assessment:  UA today 2+ leuks  PVR today 0 cc     1. Urinary incontinence - Resolved    2. Urinary frequency            Patient not bothered by     3. Kidney Stones            Found on CT 06/22/22 - Several tiny nonobstructing right-sided renal calcifications, the largest measuring 5 mm in the posterior lower pole right kidney. No hydro.            RUS 09/03/23 - Nonobstructive b/l kidney stones. Largest of R 4 mm, Largest of L 6 mm           Patient asymptomatic. Not interested in elective stone surgery or metabolic workup.     4. Right Kidney Atrophy           Found on CT 06/22/22 - Mild global right renal atrophy compared to the left without hydronephrosis.  Likely due to chronic vascular compromise           Vascular US  Renal Arterial Duplex 07/21/22 - No hemodynamically significant stenosis involving the right renal artery.           Patient asymptomatic.            Cr WNL    Plan:  Reviewed RUS. Continue to observe.   Empiric Stone Prevention Recommendations were discussed.  Patient not interested in stone surgery or workup.   Urine sent for culture.     RTC PRN.     Body mass index is 28.9 kg/m.       Chief Complaint   Patient presents with    renal atrophy    Incontinence    Nephrolithiasis       History of Present Illness:  Elaine [name] is a 73 y.o. female who presents today in follow up of urinary incontinence and atrophic right kidney found on CT in past. Patient with Duplex renal US  without stenosis.     Patient reports no bothersome  complaints.   FOS good  Some frequency at times she is not bothered by. Denies urgency or urinary incontinence.   Sensation of complete bladder emptying.   Denies straining, hesitancy, or post-void dribbling     Asymptomatic today for urinary tract infection.   Denies dysuria, gross hematuria, vaginal discharge,  f/c/n/v.   Denies hx of nephrolithiasis, hx of bladder stones, hx of recurrent UTI.   Denies FH of bladder or kidney cancer.       Past Medical History:   Diagnosis Date    Anemia     Asthma     Colon polyp     DDD (degenerative disc disease) 02/15/2009    Depression 12/18/2011    DVT (deep venous thrombosis) (HCC) 10/23/2009    Left leg:  No DVT.      Essential hypertension, benign  History of blood transfusion     History of echocardiogram 03/20/2004    EF 70%.  Borderline DDfx.  No significant valvular pathology.    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  Neg EKG on pharm stress test.    Thyroid disease     hypothyroidism       Past Surgical History:   Procedure Laterality Date    BREAST BIOPSY      BREAST SURGERY      COLONOSCOPY N/A 04/25/2020    SCREENING COLONOSCOPY performed by Kelli Pates, MD at [facility] ENDOSCOPY    COLONOSCOPY  02-24-12    normal (Dr. Rolm Clos)    COLONOSCOPY FLX DX W/COLLJ [facility] WHEN PFRMD  02-2007    +polyp(tubular adenoma); Dr Rolm Clos    HERNIA REPAIR      3x    HYSTERECTOMY (CERVIX STATUS UNKNOWN)       NEUROLOGICAL SURGERY  03-16-2009    s/p ACD & Fusion (Dr.P.Gurtner)    TOTAL HIP ARTHROPLASTY Left 01/08/2023    LEFT HIP TOTAL ARTHROPLASTY LATERAL APPROACH performed by Devora Folks, DO at [facility] MAIN OR    TUBAL LIGATION      UPPER GASTROINTESTINAL ENDOSCOPY N/A 06/12/2022    ENTEROSCOPY PUSH DIAGNOSTIC w/small intestinal bipolar cautery of multiple AVMs performed by Kelli Pates, MD at [facility] ENDOSCOPY       Social History     Tobacco Use    Smoking status: Former     Current packs/day: 0.00     Average packs/day: 1 pack/day for 27.0 years (27.0 ttl pk-yrs)     Types: Cigarettes     Start date: 10/16/1977     Quit date: 04/25/2004     Years since quitting: 19.9     Passive exposure: Never    Smokeless tobacco: Never   Vaping Use    Vaping status: Never Used   Substance Use Topics    Alcohol use: No    Drug use: Never       Allergies   Allergen Reactions    Aspirin Other (See Comments)     GI distress,     Diphenhydramine Other (See Comments)     Muscle jerking    Hydrocodone      Other Reaction(s): Unknown    Oxycodone      Pollen Extract      Other reaction(s): Not Reported This Time       Family History   Problem Relation Age of Onset    Hypertension Sister         x3    Hypertension Mother     Cancer Mother     Heart Surgery Sister     Heart Disease Sister     Hypertension Paternal Uncle     Cancer Maternal Aunt  breast    Breast Cancer Maternal Aunt     Glaucoma Son     Heart Defect Son     Diabetes Paternal Uncle     Heart Attack Sister        Current Outpatient Medications   Medication Sig Dispense Refill    prednisoLONE acetate (PRED FORTE) 1 % ophthalmic suspension instill 1 drop into right eye 4 times daily starting 3 days prior to procedure      moxifloxacin (VIGAMOX) 0.5 % ophthalmic solution instill 1 drop into right eye four times a day START 3 days prior to procedure      pantoprazole  (PROTONIX ) 40 MG tablet 1 tab(s) orally once a day      valsartan  (DIOVAN ) 320 MG tablet Take 1 tablet by  mouth daily 90 tablet 3    amLODIPine  (NORVASC ) 5 MG tablet take 1 tablet by mouth once daily 90 tablet 3    montelukast  (SINGULAIR ) 10 MG tablet TAKE 1 TABLET EVERY DAY 90 tablet 3    atorvastatin  (LIPITOR) 40 MG tablet TAKE 1 TABLET EVERY DAY (NEED MD APPOINTMENT) 90 tablet 3    gabapentin  (NEURONTIN ) 300 MG capsule TAKE 1 CAPSULE THREE TIMES DAILY. MAX DAILY AMOUNT: 900MG  270 capsule 1    levothyroxine  (SYNTHROID ) 88 MCG tablet Take 1 tablet by mouth daily 90 tablet 3    diclofenac  sodium (VOLTAREN ) 1 % GEL Apply 4 g topically 4 times daily (Patient not taking: Reported on 03/10/2024) 350 g 5    vitamin D 25 MCG (1000 UT) CAPS Take 1 capsule by mouth daily      vitamin C (ASCORBIC ACID) 500 MG tablet Take 1 tablet by mouth daily      Multiple Vitamins-Minerals (MULTIVITAMIN ADULTS 50+ PO) Take 1 tablet by mouth daily      Misc Natural Products (NEURIVA PO) Take 1 tablet by mouth in the morning and at bedtime      NONFORMULARY Take 1 tablet by mouth every evening Viviscal -1 tablet every evening for hair and scalp      potassium chloride  (KLOR-CON  M) 10 MEQ extended release tablet TAKE 1 TABLET EVERY DAY 90 tablet 3    albuterol  sulfate HFA (PROVENTIL ;VENTOLIN ;PROAIR ) 108 (90 Base) MCG/ACT inhaler Inhale 2 puffs into the lungs every 4 hours as needed for Wheezing or Shortness of Breath 18 g 5    cyanocobalamin 1000 MCG tablet Take 1 tablet by mouth every evening      ZINC PO Take 1 tablet by mouth daily      latanoprost (XALATAN) 0.005 % ophthalmic solution Place 1 drop into both eyes nightly      lidocaine  (LIDODERM ) 5 % Place 1 patch onto the skin every 24 hours 12 hours on and 12 hours off       No current facility-administered medications for this visit.           PHYSICAL EXAMINATION:     Ht 1.575 m (5\' 2" )   Wt 71.7 kg (158 lb)   BMI 28.90 kg/m   Constitutional: Well developed, well-nourished female in no acute distress.   CV:  No peripheral swelling noted  Respiratory: No respiratory distress or  difficulties  Abdomen:  Soft and nontender. No CVA tenderness.   Skin:  Normal color. No evidence of jaundice.     Neuro/Psych:  Patient with appropriate affect.  Alert and oriented.    Lymphatic:   No enlargement of supraclavicular lymph nodes.    REVIEW OF LABS AND IMAGING:  Results for orders placed or performed in visit on 04/06/24   AMB POC URINALYSIS DIP STICK AUTO W/O MICRO   Result Value Ref Range    Color, Urine, POC Yellow     Clarity, Urine, POC Clear     Glucose, Urine, POC Negative     Bilirubin, Urine, POC Negative     Ketones, Urine, POC Negative     Specific Gravity, Urine, POC 1.015 1.001 - 1.035    Blood, Urine, POC Negative Negative    pH, Urine, POC 6.5 4.6 - 8.0    Protein, Urine, POC Negative     Urobilinogen, POC Normal     Nitrite, Urine, POC Negative     Leukocyte Esterase, Urine, POC 2+    AMB POC PVR, MEAS,POST-VOID RES,US ,NON-IMAGING   Result Value Ref Range    PVR, POC 0 cc       CT AP 06/22/22  IMPRESSION:  1.  Small sclerotic focus at the right femoral head neck junction, which was  present on the right hip radiograph from 10/06/2013 and is unchanged in size.   2.  Mild global right renal atrophy compared to the left without hydronephrosis.  Likely due to chronic vascular compromise. Multiple small nonobstructing right  renal calcifications.  3.  Several tiny ventral near midline abdominal wall hernia defects as detailed  above. To contain a portion of small bowel without obstructive effects. One  contains mesenteric fat.  4.  Probable hepatic cysts. Degenerative change as detailed above. Extensive  atherosclerotic calcification    Vascular Renal Arterial Duplezx US  07/21/22     Reno-aortic ratio is unreliable due to aortic peak systolic velocity >100cm/s.    Eleveated velocity identified in the mid left renal artery in the presence of tortousity.    Left renal resistive indices elevated (consistent with parenchymal disease).    No hemodynamically significant stenosis involving the  right renal artery. Right kidney is atrophic in size (per known CTA 06/29/22). Left kidney is normal in size.    Bilateral renal vein is patent and demonstrates normal phasicity.    A copy of today's office visit with all pertinent imaging results and labs were sent to the referring physician,Hunte, Lajuan Pila, MD    Cheryln Cory, PA-C  [facility]    [address], [address]  [place], [place] [address]  P: [phone]   F: [phone]

## 2024-04-08 DIAGNOSIS — C182 Malignant neoplasm of ascending colon: Secondary | ICD-10-CM | POA: Diagnosis not present

## 2024-04-09 DIAGNOSIS — C182 Malignant neoplasm of ascending colon: Secondary | ICD-10-CM | POA: Diagnosis not present

## 2024-04-09 DIAGNOSIS — Z5112 Encounter for antineoplastic immunotherapy: Secondary | ICD-10-CM | POA: Diagnosis not present

## 2024-04-09 LAB — URINE C&S

## 2024-04-10 MED ORDER — NITROFURANTOIN MONOHYD MACRO 100 MG PO CAPS
100 | ORAL_CAPSULE | Freq: Two times a day (BID) | ORAL | 0 refills | 7.00000 days | Status: AC
Start: 2024-04-10 — End: 2024-04-17

## 2024-04-10 NOTE — Telephone Encounter (Signed)
 I attempted to call the patient, but didn't get an answer and left a message to call our office back.      If the patient calls our office back, please inform her that her urine culture came back positive for a uti, so I sent over an antibiotic to her pharmacy on file.

## 2024-04-10 NOTE — Telephone Encounter (Signed)
-----   Message from Cheryln Cory, [place] sent at 04/10/2024 10:26 AM EDT -----  Start macrobid 100 mg BID x7

## 2024-04-10 NOTE — Other (Signed)
Start macrobid 100 mg BIDx7

## 2024-04-27 ENCOUNTER — Encounter

## 2024-04-28 MED ORDER — GABAPENTIN 300 MG PO CAPS
300 | ORAL_CAPSULE | ORAL | 1 refills | 30.00000 days | Status: DC
Start: 2024-04-28 — End: 2024-08-22

## 2024-04-28 NOTE — Telephone Encounter (Signed)
 [place] 04/28/2024    The last fill date for Gabapentin  300 Mg Capsule was 11/05/2024 for a 90 d/s qty 270      Last UDS: completed     CSA Last signed: 03/01/2023        PCP: Lorra Rosella, MD    Last appt: 03/07/2024    Future Appointments   Date Time Provider Department Center   07/28/2024  8:00 AM Teena Feast, MD [facility] AMB   08/10/2024  3:00 PM [facility] LAB VISIT [facility]   08/17/2024  4:00 PM Hunte, Lajuan Pila, MD [facility]       Requested Prescriptions     Pending Prescriptions Disp Refills    gabapentin  (NEURONTIN ) 300 MG capsule [Pharmacy Med Name: Gabapentin  Oral Capsule 300 MG] 270 capsule      Sig: TAKE 1 CAPSULE THREE TIMES DAILY (MAX DAILY AMOUNT: 900MG )

## 2024-05-03 ENCOUNTER — Encounter

## 2024-05-03 DIAGNOSIS — D509 Iron deficiency anemia, unspecified: Secondary | ICD-10-CM | POA: Diagnosis not present

## 2024-05-03 DIAGNOSIS — R634 Abnormal weight loss: Secondary | ICD-10-CM | POA: Diagnosis not present

## 2024-05-03 DIAGNOSIS — I2699 Other pulmonary embolism without acute cor pulmonale: Secondary | ICD-10-CM | POA: Diagnosis not present

## 2024-05-03 DIAGNOSIS — C183 Malignant neoplasm of hepatic flexure: Secondary | ICD-10-CM | POA: Diagnosis not present

## 2024-05-03 DIAGNOSIS — J9601 Acute respiratory failure with hypoxia: Secondary | ICD-10-CM | POA: Diagnosis not present

## 2024-05-03 MED ORDER — AMLODIPINE BESYLATE 5 MG PO TABS
5 | ORAL_TABLET | Freq: Every day | ORAL | 2 refills | 90.00000 days | Status: AC
Start: 2024-05-03 — End: ?

## 2024-05-03 MED ORDER — VALSARTAN 320 MG PO TABS
320 | ORAL_TABLET | Freq: Every day | ORAL | 2 refills | 90.00000 days | Status: DC
Start: 2024-05-03 — End: 2024-12-21

## 2024-05-03 MED ORDER — LEVOTHYROXINE SODIUM 88 MCG PO TABS
88 | ORAL_TABLET | Freq: Every day | ORAL | 2 refills | 90.00000 days | Status: AC
Start: 2024-05-03 — End: ?

## 2024-05-03 NOTE — Telephone Encounter (Signed)
 Last OV: 03/07/2024  Next OV: 08/17/2024  Last refill: 07/02/2023    Valsartan  sent to 03/07/2024  Amlodipine  sent 02/07/2024

## 2024-05-04 DIAGNOSIS — Z5112 Encounter for antineoplastic immunotherapy: Secondary | ICD-10-CM | POA: Diagnosis not present

## 2024-05-04 DIAGNOSIS — C182 Malignant neoplasm of ascending colon: Secondary | ICD-10-CM | POA: Diagnosis not present

## 2024-05-06 DIAGNOSIS — C182 Malignant neoplasm of ascending colon: Secondary | ICD-10-CM | POA: Diagnosis not present

## 2024-05-08 DIAGNOSIS — C182 Malignant neoplasm of ascending colon: Secondary | ICD-10-CM | POA: Diagnosis not present

## 2024-05-08 DIAGNOSIS — Z5112 Encounter for antineoplastic immunotherapy: Secondary | ICD-10-CM | POA: Diagnosis not present

## 2024-05-13 DIAGNOSIS — T451X5A Adverse effect of antineoplastic and immunosuppressive drugs, initial encounter: Secondary | ICD-10-CM | POA: Diagnosis not present

## 2024-05-13 DIAGNOSIS — E119 Type 2 diabetes mellitus without complications: Secondary | ICD-10-CM | POA: Diagnosis not present

## 2024-05-13 DIAGNOSIS — Z8719 Personal history of other diseases of the digestive system: Secondary | ICD-10-CM | POA: Diagnosis not present

## 2024-05-13 DIAGNOSIS — I2489 Other forms of acute ischemic heart disease: Secondary | ICD-10-CM | POA: Diagnosis present

## 2024-05-13 DIAGNOSIS — E66811 Obesity, class 1: Secondary | ICD-10-CM | POA: Diagnosis not present

## 2024-05-13 DIAGNOSIS — G8929 Other chronic pain: Secondary | ICD-10-CM | POA: Diagnosis not present

## 2024-05-13 DIAGNOSIS — I10 Essential (primary) hypertension: Secondary | ICD-10-CM | POA: Diagnosis not present

## 2024-05-13 DIAGNOSIS — N1831 Chronic kidney disease, stage 3a: Secondary | ICD-10-CM | POA: Diagnosis not present

## 2024-05-13 DIAGNOSIS — M109 Gout, unspecified: Secondary | ICD-10-CM | POA: Diagnosis not present

## 2024-05-13 DIAGNOSIS — R000 Tachycardia, unspecified: Secondary | ICD-10-CM | POA: Diagnosis not present

## 2024-05-13 DIAGNOSIS — J9811 Atelectasis: Secondary | ICD-10-CM | POA: Diagnosis not present

## 2024-05-13 DIAGNOSIS — R5381 Other malaise: Secondary | ICD-10-CM | POA: Diagnosis not present

## 2024-05-13 DIAGNOSIS — K219 Gastro-esophageal reflux disease without esophagitis: Secondary | ICD-10-CM | POA: Diagnosis not present

## 2024-05-13 DIAGNOSIS — Z91128 Patient's intentional underdosing of medication regimen for other reason: Secondary | ICD-10-CM | POA: Diagnosis not present

## 2024-05-13 DIAGNOSIS — Z7901 Long term (current) use of anticoagulants: Secondary | ICD-10-CM | POA: Diagnosis not present

## 2024-05-13 DIAGNOSIS — D509 Iron deficiency anemia, unspecified: Secondary | ICD-10-CM | POA: Diagnosis present

## 2024-05-13 DIAGNOSIS — M25562 Pain in left knee: Secondary | ICD-10-CM | POA: Diagnosis not present

## 2024-05-13 DIAGNOSIS — Z885 Allergy status to narcotic agent status: Secondary | ICD-10-CM | POA: Diagnosis not present

## 2024-05-13 DIAGNOSIS — R531 Weakness: Secondary | ICD-10-CM | POA: Diagnosis not present

## 2024-05-13 DIAGNOSIS — R9431 Abnormal electrocardiogram [ECG] [EKG]: Secondary | ICD-10-CM | POA: Diagnosis not present

## 2024-05-13 DIAGNOSIS — C799 Secondary malignant neoplasm of unspecified site: Secondary | ICD-10-CM | POA: Diagnosis not present

## 2024-05-13 DIAGNOSIS — T383X6A Underdosing of insulin and oral hypoglycemic [antidiabetic] drugs, initial encounter: Secondary | ICD-10-CM | POA: Diagnosis not present

## 2024-05-13 DIAGNOSIS — N189 Chronic kidney disease, unspecified: Secondary | ICD-10-CM | POA: Diagnosis not present

## 2024-05-13 DIAGNOSIS — M79605 Pain in left leg: Secondary | ICD-10-CM | POA: Diagnosis not present

## 2024-05-13 DIAGNOSIS — D649 Anemia, unspecified: Secondary | ICD-10-CM | POA: Diagnosis not present

## 2024-05-13 DIAGNOSIS — Z6830 Body mass index (BMI) 30.0-30.9, adult: Secondary | ICD-10-CM | POA: Diagnosis not present

## 2024-05-13 DIAGNOSIS — I129 Hypertensive chronic kidney disease with stage 1 through stage 4 chronic kidney disease, or unspecified chronic kidney disease: Secondary | ICD-10-CM | POA: Diagnosis not present

## 2024-05-13 DIAGNOSIS — E785 Hyperlipidemia, unspecified: Secondary | ICD-10-CM | POA: Diagnosis not present

## 2024-05-13 DIAGNOSIS — Z86718 Personal history of other venous thrombosis and embolism: Secondary | ICD-10-CM | POA: Diagnosis not present

## 2024-05-13 DIAGNOSIS — Z743 Need for continuous supervision: Secondary | ICD-10-CM | POA: Diagnosis not present

## 2024-05-13 DIAGNOSIS — M1712 Unilateral primary osteoarthritis, left knee: Secondary | ICD-10-CM | POA: Diagnosis not present

## 2024-05-13 DIAGNOSIS — R778 Other specified abnormalities of plasma proteins: Secondary | ICD-10-CM | POA: Diagnosis not present

## 2024-05-13 DIAGNOSIS — Z86711 Personal history of pulmonary embolism: Secondary | ICD-10-CM | POA: Diagnosis not present

## 2024-05-13 DIAGNOSIS — J90 Pleural effusion, not elsewhere classified: Secondary | ICD-10-CM | POA: Diagnosis not present

## 2024-05-13 DIAGNOSIS — C182 Malignant neoplasm of ascending colon: Secondary | ICD-10-CM | POA: Diagnosis present

## 2024-05-13 DIAGNOSIS — D500 Iron deficiency anemia secondary to blood loss (chronic): Secondary | ICD-10-CM | POA: Diagnosis not present

## 2024-05-13 DIAGNOSIS — E876 Hypokalemia: Secondary | ICD-10-CM | POA: Diagnosis present

## 2024-05-13 DIAGNOSIS — E860 Dehydration: Secondary | ICD-10-CM | POA: Diagnosis not present

## 2024-05-13 DIAGNOSIS — R7989 Other specified abnormal findings of blood chemistry: Secondary | ICD-10-CM | POA: Diagnosis not present

## 2024-05-13 DIAGNOSIS — R42 Dizziness and giddiness: Secondary | ICD-10-CM | POA: Diagnosis not present

## 2024-05-13 DIAGNOSIS — E1122 Type 2 diabetes mellitus with diabetic chronic kidney disease: Secondary | ICD-10-CM | POA: Diagnosis not present

## 2024-05-13 DIAGNOSIS — R001 Bradycardia, unspecified: Secondary | ICD-10-CM | POA: Diagnosis not present

## 2024-05-13 DIAGNOSIS — D630 Anemia in neoplastic disease: Secondary | ICD-10-CM | POA: Diagnosis present

## 2024-05-17 DIAGNOSIS — C787 Secondary malignant neoplasm of liver and intrahepatic bile duct: Secondary | ICD-10-CM | POA: Diagnosis not present

## 2024-05-17 DIAGNOSIS — J9601 Acute respiratory failure with hypoxia: Secondary | ICD-10-CM | POA: Diagnosis not present

## 2024-05-17 DIAGNOSIS — D509 Iron deficiency anemia, unspecified: Secondary | ICD-10-CM | POA: Diagnosis not present

## 2024-05-17 DIAGNOSIS — I2699 Other pulmonary embolism without acute cor pulmonale: Secondary | ICD-10-CM | POA: Diagnosis not present

## 2024-05-17 DIAGNOSIS — C183 Malignant neoplasm of hepatic flexure: Secondary | ICD-10-CM | POA: Diagnosis not present

## 2024-05-17 DIAGNOSIS — R634 Abnormal weight loss: Secondary | ICD-10-CM | POA: Diagnosis not present

## 2024-05-23 DIAGNOSIS — Z86718 Personal history of other venous thrombosis and embolism: Secondary | ICD-10-CM | POA: Diagnosis not present

## 2024-05-23 DIAGNOSIS — D649 Anemia, unspecified: Secondary | ICD-10-CM | POA: Diagnosis not present

## 2024-05-23 DIAGNOSIS — K219 Gastro-esophageal reflux disease without esophagitis: Secondary | ICD-10-CM | POA: Diagnosis not present

## 2024-05-23 DIAGNOSIS — Z79899 Other long term (current) drug therapy: Secondary | ICD-10-CM | POA: Diagnosis not present

## 2024-05-23 DIAGNOSIS — E785 Hyperlipidemia, unspecified: Secondary | ICD-10-CM | POA: Diagnosis not present

## 2024-05-23 DIAGNOSIS — C183 Malignant neoplasm of hepatic flexure: Secondary | ICD-10-CM | POA: Diagnosis not present

## 2024-05-23 DIAGNOSIS — E66811 Obesity, class 1: Secondary | ICD-10-CM | POA: Diagnosis not present

## 2024-05-23 DIAGNOSIS — Z7901 Long term (current) use of anticoagulants: Secondary | ICD-10-CM | POA: Diagnosis not present

## 2024-05-23 DIAGNOSIS — E878 Other disorders of electrolyte and fluid balance, not elsewhere classified: Secondary | ICD-10-CM | POA: Diagnosis not present

## 2024-05-23 DIAGNOSIS — C787 Secondary malignant neoplasm of liver and intrahepatic bile duct: Secondary | ICD-10-CM | POA: Diagnosis not present

## 2024-05-23 DIAGNOSIS — R42 Dizziness and giddiness: Secondary | ICD-10-CM | POA: Diagnosis not present

## 2024-05-23 DIAGNOSIS — K521 Toxic gastroenteritis and colitis: Secondary | ICD-10-CM | POA: Diagnosis not present

## 2024-05-23 DIAGNOSIS — E119 Type 2 diabetes mellitus without complications: Secondary | ICD-10-CM | POA: Diagnosis not present

## 2024-05-23 DIAGNOSIS — E860 Dehydration: Secondary | ICD-10-CM | POA: Diagnosis not present

## 2024-05-23 DIAGNOSIS — Z885 Allergy status to narcotic agent status: Secondary | ICD-10-CM | POA: Diagnosis not present

## 2024-05-23 DIAGNOSIS — Z86711 Personal history of pulmonary embolism: Secondary | ICD-10-CM | POA: Diagnosis not present

## 2024-05-23 DIAGNOSIS — R0602 Shortness of breath: Secondary | ICD-10-CM | POA: Diagnosis not present

## 2024-05-23 DIAGNOSIS — E876 Hypokalemia: Secondary | ICD-10-CM | POA: Diagnosis not present

## 2024-05-23 DIAGNOSIS — R202 Paresthesia of skin: Secondary | ICD-10-CM | POA: Diagnosis not present

## 2024-05-23 DIAGNOSIS — I10 Essential (primary) hypertension: Secondary | ICD-10-CM | POA: Diagnosis not present

## 2024-05-23 DIAGNOSIS — Z6831 Body mass index (BMI) 31.0-31.9, adult: Secondary | ICD-10-CM | POA: Diagnosis not present

## 2024-05-23 DIAGNOSIS — R002 Palpitations: Secondary | ICD-10-CM | POA: Diagnosis not present

## 2024-05-24 DIAGNOSIS — D509 Iron deficiency anemia, unspecified: Secondary | ICD-10-CM | POA: Diagnosis not present

## 2024-05-24 DIAGNOSIS — E876 Hypokalemia: Secondary | ICD-10-CM | POA: Diagnosis not present

## 2024-05-24 DIAGNOSIS — E785 Hyperlipidemia, unspecified: Secondary | ICD-10-CM | POA: Diagnosis not present

## 2024-05-24 DIAGNOSIS — K219 Gastro-esophageal reflux disease without esophagitis: Secondary | ICD-10-CM | POA: Diagnosis not present

## 2024-05-24 DIAGNOSIS — E860 Dehydration: Secondary | ICD-10-CM | POA: Diagnosis not present

## 2024-05-24 DIAGNOSIS — D649 Anemia, unspecified: Secondary | ICD-10-CM | POA: Diagnosis not present

## 2024-05-24 DIAGNOSIS — C183 Malignant neoplasm of hepatic flexure: Secondary | ICD-10-CM | POA: Diagnosis not present

## 2024-05-24 DIAGNOSIS — C787 Secondary malignant neoplasm of liver and intrahepatic bile duct: Secondary | ICD-10-CM | POA: Diagnosis not present

## 2024-05-24 DIAGNOSIS — I10 Essential (primary) hypertension: Secondary | ICD-10-CM | POA: Diagnosis not present

## 2024-05-25 DIAGNOSIS — C787 Secondary malignant neoplasm of liver and intrahepatic bile duct: Secondary | ICD-10-CM | POA: Diagnosis not present

## 2024-05-25 DIAGNOSIS — D509 Iron deficiency anemia, unspecified: Secondary | ICD-10-CM | POA: Diagnosis not present

## 2024-05-25 DIAGNOSIS — C183 Malignant neoplasm of hepatic flexure: Secondary | ICD-10-CM | POA: Diagnosis not present

## 2024-05-31 DIAGNOSIS — D509 Iron deficiency anemia, unspecified: Secondary | ICD-10-CM | POA: Diagnosis not present

## 2024-05-31 DIAGNOSIS — C183 Malignant neoplasm of hepatic flexure: Secondary | ICD-10-CM | POA: Diagnosis not present

## 2024-05-31 DIAGNOSIS — C787 Secondary malignant neoplasm of liver and intrahepatic bile duct: Secondary | ICD-10-CM | POA: Diagnosis not present

## 2024-05-31 DIAGNOSIS — R634 Abnormal weight loss: Secondary | ICD-10-CM | POA: Diagnosis not present

## 2024-05-31 DIAGNOSIS — I2699 Other pulmonary embolism without acute cor pulmonale: Secondary | ICD-10-CM | POA: Diagnosis not present

## 2024-05-31 DIAGNOSIS — J9601 Acute respiratory failure with hypoxia: Secondary | ICD-10-CM | POA: Diagnosis not present

## 2024-06-04 DIAGNOSIS — R55 Syncope and collapse: Secondary | ICD-10-CM | POA: Diagnosis not present

## 2024-06-04 DIAGNOSIS — C189 Malignant neoplasm of colon, unspecified: Secondary | ICD-10-CM | POA: Diagnosis not present

## 2024-06-04 DIAGNOSIS — F419 Anxiety disorder, unspecified: Secondary | ICD-10-CM | POA: Diagnosis not present

## 2024-06-04 DIAGNOSIS — Z5901 Sheltered homelessness: Secondary | ICD-10-CM | POA: Diagnosis not present

## 2024-06-04 DIAGNOSIS — E785 Hyperlipidemia, unspecified: Secondary | ICD-10-CM | POA: Diagnosis not present

## 2024-06-04 DIAGNOSIS — I824Z1 Acute embolism and thrombosis of unspecified deep veins of right distal lower extremity: Secondary | ICD-10-CM | POA: Diagnosis not present

## 2024-06-04 DIAGNOSIS — E1122 Type 2 diabetes mellitus with diabetic chronic kidney disease: Secondary | ICD-10-CM | POA: Diagnosis not present

## 2024-06-04 DIAGNOSIS — R7989 Other specified abnormal findings of blood chemistry: Secondary | ICD-10-CM | POA: Diagnosis not present

## 2024-06-04 DIAGNOSIS — I2782 Chronic pulmonary embolism: Secondary | ICD-10-CM | POA: Diagnosis not present

## 2024-06-04 DIAGNOSIS — M109 Gout, unspecified: Secondary | ICD-10-CM | POA: Diagnosis not present

## 2024-06-04 DIAGNOSIS — I82431 Acute embolism and thrombosis of right popliteal vein: Secondary | ICD-10-CM | POA: Diagnosis not present

## 2024-06-04 DIAGNOSIS — I129 Hypertensive chronic kidney disease with stage 1 through stage 4 chronic kidney disease, or unspecified chronic kidney disease: Secondary | ICD-10-CM | POA: Diagnosis not present

## 2024-06-04 DIAGNOSIS — E878 Other disorders of electrolyte and fluid balance, not elsewhere classified: Secondary | ICD-10-CM | POA: Diagnosis not present

## 2024-06-04 DIAGNOSIS — Z743 Need for continuous supervision: Secondary | ICD-10-CM | POA: Diagnosis not present

## 2024-06-04 DIAGNOSIS — Z7901 Long term (current) use of anticoagulants: Secondary | ICD-10-CM | POA: Diagnosis not present

## 2024-06-04 DIAGNOSIS — Z79899 Other long term (current) drug therapy: Secondary | ICD-10-CM | POA: Diagnosis not present

## 2024-06-04 DIAGNOSIS — I824Z2 Acute embolism and thrombosis of unspecified deep veins of left distal lower extremity: Secondary | ICD-10-CM | POA: Diagnosis not present

## 2024-06-04 DIAGNOSIS — D630 Anemia in neoplastic disease: Secondary | ICD-10-CM | POA: Diagnosis not present

## 2024-06-04 DIAGNOSIS — N189 Chronic kidney disease, unspecified: Secondary | ICD-10-CM | POA: Diagnosis not present

## 2024-06-04 DIAGNOSIS — D631 Anemia in chronic kidney disease: Secondary | ICD-10-CM | POA: Diagnosis not present

## 2024-06-04 DIAGNOSIS — K219 Gastro-esophageal reflux disease without esophagitis: Secondary | ICD-10-CM | POA: Diagnosis not present

## 2024-06-04 DIAGNOSIS — R0689 Other abnormalities of breathing: Secondary | ICD-10-CM | POA: Diagnosis not present

## 2024-06-04 DIAGNOSIS — R0602 Shortness of breath: Secondary | ICD-10-CM | POA: Diagnosis not present

## 2024-06-07 DIAGNOSIS — D649 Anemia, unspecified: Secondary | ICD-10-CM | POA: Diagnosis not present

## 2024-06-07 DIAGNOSIS — E876 Hypokalemia: Secondary | ICD-10-CM | POA: Diagnosis not present

## 2024-06-23 ENCOUNTER — Encounter

## 2024-06-23 ENCOUNTER — Inpatient Hospital Stay: Admit: 2024-06-23 | Payer: Medicare (Managed Care) | Primary: Internal Medicine

## 2024-06-23 DIAGNOSIS — N261 Atrophy of kidney (terminal): Principal | ICD-10-CM

## 2024-06-23 LAB — RENAL FUNCTION PANEL
Albumin: 3.6 g/dL (ref 3.4–5.0)
Anion Gap: 12 mmol/L (ref 3.0–18.0)
BUN/Creatinine Ratio: 11 — ABNORMAL LOW (ref 12–20)
BUN: 7 mg/dL (ref 6–23)
CO2: 27 mmol/L (ref 21–32)
Calcium: 9.4 mg/dL (ref 8.5–10.1)
Chloride: 108 mmol/L — ABNORMAL HIGH (ref 98–107)
Creatinine: 0.63 mg/dL (ref 0.6–1.3)
Est, Glom Filt Rate: >90 ml/min/1.73m2 (ref >60)
Glucose: 86 mg/dL (ref 74–108)
Phosphorus: 3.3 mg/dL (ref 2.5–4.9)
Potassium: 3.8 mmol/L (ref 3.5–5.5)
Sodium: 146 mmol/L — ABNORMAL HIGH (ref 136–145)

## 2024-06-23 LAB — URINALYSIS WITH MICROSCOPIC
Bilirubin, Urine: NEGATIVE
Blood, Urine: NEGATIVE
Glucose, Ur: NEGATIVE mg/dL
Nitrite, Urine: NEGATIVE
Protein, UA: 30 mg/dL — AB
Specific Gravity, UA: 1.026 (ref 1.005–1.030)
Urobilinogen, Urine: 1 EU/dL (ref 0.2–1.0)
pH, Urine: 5.5 (ref 5.0–8.0)

## 2024-06-23 LAB — VITAMIN D 25 HYDROXY: Vit D, 25-Hydroxy: 52.9 ng/mL (ref 30.0–100.0)

## 2024-06-23 LAB — CBC
Hematocrit: 31.8 % — ABNORMAL LOW (ref 35.0–45.0)
Hemoglobin: 9.9 g/dL — ABNORMAL LOW (ref 12.0–16.0)
MCH: 27 pg (ref 24.0–34.0)
MCHC: 31.1 g/dL (ref 31.0–37.0)
MCV: 86.9 FL (ref 78.0–100.0)
MPV: 11 FL (ref 9.2–11.8)
Nucleated RBCs: 0 /100{WBCs}
Platelets: 243 K/uL (ref 135–420)
RBC: 3.66 M/uL — ABNORMAL LOW (ref 4.20–5.30)
RDW: 16.1 % — ABNORMAL HIGH (ref 11.6–14.5)
WBC: 4.1 K/uL — ABNORMAL LOW (ref 4.6–13.2)
nRBC: 0 K/uL (ref 0.00–0.01)

## 2024-06-23 LAB — URIC ACID: Uric Acid: 3.9 mg/dL (ref 2.6–7.2)

## 2024-06-23 LAB — ALBUMIN/CREATININE RATIO, URINE
Albumin Urine: 11 mg/dL — ABNORMAL HIGH (ref 0.0–3.0)
Albumin/Creatinine Ratio: 52 mg/g — ABNORMAL HIGH (ref 0–30)
Creatinine, Ur: 213 mg/dL — ABNORMAL HIGH (ref 30–125)

## 2024-06-23 LAB — PROTEIN, URINE, RANDOM: Protein, Urine, Random: 35 mg/dL — ABNORMAL HIGH (ref 0–12)

## 2024-06-23 LAB — CREATININE, RANDOM URINE: Creatinine, Ur: 214 mg/dL — ABNORMAL HIGH (ref 30–125)

## 2024-06-23 LAB — PTH, INTACT
Calcium: 9.3 mg/dL (ref 8.5–10.1)
Pth Intact: 36.4 pg/mL (ref 15.0–65.0)

## 2024-06-23 LAB — ALKALINE PHOSPHATASE: Alk Phosphatase: 70 U/L (ref 45–117)

## 2024-06-23 LAB — MAGNESIUM: Magnesium: 1.9 mg/dL (ref 1.6–2.6)

## 2024-06-28 DIAGNOSIS — C183 Malignant neoplasm of hepatic flexure: Secondary | ICD-10-CM | POA: Diagnosis not present

## 2024-06-28 DIAGNOSIS — J9601 Acute respiratory failure with hypoxia: Secondary | ICD-10-CM | POA: Diagnosis not present

## 2024-06-28 DIAGNOSIS — D509 Iron deficiency anemia, unspecified: Secondary | ICD-10-CM | POA: Diagnosis not present

## 2024-06-29 DIAGNOSIS — E876 Hypokalemia: Secondary | ICD-10-CM | POA: Diagnosis not present

## 2024-06-29 DIAGNOSIS — I10 Essential (primary) hypertension: Secondary | ICD-10-CM | POA: Diagnosis not present

## 2024-06-29 DIAGNOSIS — R269 Unspecified abnormalities of gait and mobility: Secondary | ICD-10-CM | POA: Diagnosis not present

## 2024-06-29 DIAGNOSIS — Z9181 History of falling: Secondary | ICD-10-CM | POA: Diagnosis not present

## 2024-06-29 DIAGNOSIS — E785 Hyperlipidemia, unspecified: Secondary | ICD-10-CM | POA: Diagnosis not present

## 2024-06-30 DIAGNOSIS — C183 Malignant neoplasm of hepatic flexure: Secondary | ICD-10-CM | POA: Diagnosis not present

## 2024-06-30 DIAGNOSIS — J9601 Acute respiratory failure with hypoxia: Secondary | ICD-10-CM | POA: Diagnosis not present

## 2024-06-30 DIAGNOSIS — D509 Iron deficiency anemia, unspecified: Secondary | ICD-10-CM | POA: Diagnosis not present

## 2024-07-15 DIAGNOSIS — Z86718 Personal history of other venous thrombosis and embolism: Secondary | ICD-10-CM | POA: Diagnosis not present

## 2024-07-15 DIAGNOSIS — Z743 Need for continuous supervision: Secondary | ICD-10-CM | POA: Diagnosis not present

## 2024-07-15 DIAGNOSIS — Z86711 Personal history of pulmonary embolism: Secondary | ICD-10-CM | POA: Diagnosis not present

## 2024-07-15 DIAGNOSIS — J470 Bronchiectasis with acute lower respiratory infection: Secondary | ICD-10-CM | POA: Diagnosis not present

## 2024-07-15 DIAGNOSIS — J189 Pneumonia, unspecified organism: Secondary | ICD-10-CM | POA: Diagnosis not present

## 2024-07-15 DIAGNOSIS — Z85038 Personal history of other malignant neoplasm of large intestine: Secondary | ICD-10-CM | POA: Diagnosis not present

## 2024-07-15 DIAGNOSIS — C787 Secondary malignant neoplasm of liver and intrahepatic bile duct: Secondary | ICD-10-CM | POA: Diagnosis not present

## 2024-07-15 DIAGNOSIS — M899 Disorder of bone, unspecified: Secondary | ICD-10-CM | POA: Diagnosis not present

## 2024-07-15 DIAGNOSIS — R918 Other nonspecific abnormal finding of lung field: Secondary | ICD-10-CM | POA: Diagnosis not present

## 2024-07-15 DIAGNOSIS — R0602 Shortness of breath: Secondary | ICD-10-CM | POA: Diagnosis not present

## 2024-07-15 DIAGNOSIS — Z7901 Long term (current) use of anticoagulants: Secondary | ICD-10-CM | POA: Diagnosis not present

## 2024-07-27 MED ORDER — ATORVASTATIN CALCIUM 40 MG PO TABS
40 | ORAL_TABLET | ORAL | 3 refills | 90.00000 days | Status: AC
Start: 2024-07-27 — End: ?

## 2024-07-27 NOTE — Telephone Encounter (Signed)
 Last OV: 02/15/2024  Next OV: 08/17/2024  Last refill: 10/04/2023

## 2024-07-28 ENCOUNTER — Ambulatory Visit
Admit: 2024-07-28 | Discharge: 2024-07-28 | Payer: Medicare (Managed Care) | Attending: Student in an Organized Health Care Education/Training Program | Primary: Internal Medicine

## 2024-07-28 VITALS — BP 153/74 | HR 83 | Temp 97.80000°F | Ht 62.0 in | Wt 157.0 lb

## 2024-07-28 DIAGNOSIS — G935 Compression of brain: Principal | ICD-10-CM

## 2024-07-28 NOTE — Progress Notes (Signed)
 Elaine [name] is a 73 y.o. female .presents for New Patient (Chiari malformation )   .    A 73 years old female patient referred here for evaluation of past diagnosis of Chiari malformation.  She had cervical fusion surgery in 2010 [ACDF].  Was diagnosed with Chiari malformation at the time.  She was stumbling when walking.  Feels unsteady/off-balance.  She mentioned that my legs lead me; I do not lead them.  Might fall.  No weakness.  No incontinence.  She is following with urology for urgency and frequency.  No history of head trauma.  She mentioned headaches; tends to increase with asthma.  It is global.  No occipital headache/pain.  No worsening headache with straining/coughing.  No passing out spells previously.  She mentioned occasional neck pain; mild stiffness.  Has hip replacement surgery in February 2025 on the left side.  Has difficulty with shoulder movement on the left.  Has occasional weakness of the left hand.  No numbness.  No problem with her speech.  No problems swallowing.  MRI of the brain from October 2023: Microvascular ischemic changes in the white matter; The cerebellar tonsils extend approximately 8 mm inferior to the level of the foramen magnum compatible with a Chiari I malformation.        Review of Systems   Constitutional:  Negative for chills, fever and unexpected weight change.   HENT:  Positive for hearing loss (sometimes). Negative for tinnitus.    Eyes:  Negative for visual disturbance (has glasses).   Respiratory:  Positive for cough (sometimes). Negative for shortness of breath.    Cardiovascular:  Negative for chest pain and leg swelling.   Gastrointestinal:  Negative for nausea and vomiting.   Genitourinary:  Positive for frequency and urgency. Negative for dysuria.   Musculoskeletal:  Positive for back pain and neck pain (mild stiffness).   Skin:  Negative for rash.   Neurological:  Positive for weakness (left hand) and headaches (sometimes). Negative for dizziness,  tremors, seizures, syncope, facial asymmetry, speech difficulty and numbness.         Past Medical History:   Diagnosis Date    Anemia     Asthma     Colon polyp     DDD (degenerative disc disease) 02/15/2009    Depression 12/18/2011    DVT (deep venous thrombosis) (HCC) 10/23/2009    Left leg:  No DVT.      Essential hypertension, benign     History of blood transfusion     History of echocardiogram 03/20/2004    EF 70%.  Borderline DDfx.  No significant valvular pathology.    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  Neg EKG on pharm stress test.    Thyroid disease     hypothyroidism  Past Surgical History:   Procedure Laterality Date    BREAST BIOPSY      BREAST SURGERY      COLONOSCOPY N/A 04/25/2020    SCREENING COLONOSCOPY performed by Pattie Dale PARAS, MD at [facility] ENDOSCOPY    COLONOSCOPY  02-24-12    normal (Dr. Pattie)    COLONOSCOPY FLX DX W/COLLJ [facility] WHEN PFRMD  02-2007    +polyp(tubular adenoma); Dr Pattie    HERNIA REPAIR      3x    HYSTERECTOMY (CERVIX STATUS UNKNOWN)      NEUROLOGICAL SURGERY  03-16-2009    s/p ACD & Fusion (Dr.P.Gurtner)    TOTAL HIP ARTHROPLASTY Left 01/08/2023    LEFT HIP TOTAL ARTHROPLASTY LATERAL APPROACH performed by Marlyse Morene ORN, DO at [facility] MAIN OR    TUBAL LIGATION      UPPER GASTROINTESTINAL ENDOSCOPY N/A 06/12/2022    ENTEROSCOPY PUSH DIAGNOSTIC w/small intestinal bipolar cautery of multiple AVMs  performed by Dale PARAS Pattie, MD at [facility] ENDOSCOPY        Family History   Problem Relation Age of Onset    Hypertension Sister         x3    Hypertension Mother     Cancer Mother     Heart Surgery Sister     Heart Disease Sister     Hypertension Paternal Uncle     Cancer Maternal Aunt         breast    Breast Cancer Maternal Aunt     Glaucoma Son     Heart Defect Son     Diabetes Paternal Uncle     Heart Attack Sister         Social History     Socioeconomic History    Marital status: Divorced     Spouse name: Not on file    Number of children: Not on file    Years of education: Not on file    Highest education level: Not on file   Occupational History    Not on file   Tobacco Use    Smoking status: Former     Current packs/day: 0.00     Average packs/day: 1 pack/day for 27.0 years (27.0 ttl pk-yrs)     Types: Cigarettes     Start date: 10/16/1977     Quit date: 04/25/2004     Years since quitting: 20.2     Passive exposure: Never    Smokeless tobacco: Never   Vaping Use    Vaping status: Never Used   Substance and Sexual Activity    Alcohol use: No    Drug use: Never    Sexual activity: Not Currently   Other Topics Concern    Not on file   Social History Narrative    Not on file     Social Drivers of Health     Financial Resource Strain: Low Risk  (01/04/2023)    Overall Financial Resource Strain (CARDIA)     Difficulty of Paying Living Expenses: Not hard at all   Food Insecurity: No Food Insecurity (02/15/2024)    Hunger Vital Sign     Worried About Running Out of Food in the Last Year: Never true     Ran Out of Food in the Last Year: Never true   Transportation Needs: No Transportation Needs (02/15/2024)    PRAPARE - Therapist, art (Medical): No     Lack of Transportation (Non-Medical): No  Physical Activity: Insufficiently Active (02/15/2024)    Exercise Vital Sign     Days of Exercise per Week: 2 days     Minutes of Exercise per Session: 30 min   Stress: No Stress Concern Present  (01/04/2023)    [employer] of Occupational Health - Occupational Stress Questionnaire     Feeling of Stress : Not at all   Social Connections: Feeling Socially Integrated (02/26/2023)    OASIS D0700: Social Isolation     Frequency of experiencing loneliness or isolation: Never   Intimate Partner Violence: Not At Risk (01/04/2023)    Humiliation, Afraid, Rape, and Kick questionnaire     Fear of Current or Ex-Partner: No     Emotionally Abused: No     Physically Abused: No     Sexually Abused: No   Housing Stability: Low Risk  (02/15/2024)    Housing Stability Vital Sign     Unable to Pay for Housing in the Last Year: No     Number of Times Moved in the Last Year: 0     Homeless in the Last Year: No        Allergies   Allergen Reactions    Aspirin Other (See Comments)     GI distress,     Diphenhydramine Other (See Comments)     Muscle jerking    Hydrocodone      Other Reaction(s): Unknown    Oxycodone      Pollen Extract      Other reaction(s): Not Reported This Time         Current Outpatient Medications   Medication Sig Dispense Refill    atorvastatin  (LIPITOR) 40 MG tablet TAKE 1 TABLET EVERY DAY (NEED MD APPOINTMENT) 90 tablet 3    levothyroxine  (SYNTHROID ) 88 MCG tablet Take 1 tablet by mouth Daily 90 tablet 2    valsartan  (DIOVAN ) 320 MG tablet Take 1 tablet by mouth daily 90 tablet 2    amLODIPine  (NORVASC ) 5 MG tablet Take 1 tablet by mouth daily 90 tablet 2    gabapentin  (NEURONTIN ) 300 MG capsule TAKE 1 CAPSULE THREE TIMES DAILY (MAX DAILY AMOUNT: 900MG ) 270 capsule 1    moxifloxacin (VIGAMOX) 0.5 % ophthalmic solution instill 1 drop into right eye four times a day START 3 days prior to procedure      pantoprazole  (PROTONIX ) 40 MG tablet 1 tab(s) orally once a day      prednisoLONE acetate (PRED FORTE) 1 % ophthalmic suspension instill 1 drop into right eye 4 times daily starting 3 days prior to procedure      montelukast  (SINGULAIR ) 10 MG tablet TAKE 1 TABLET EVERY DAY 90 tablet 3    diclofenac  sodium  (VOLTAREN ) 1 % GEL Apply 4 g topically 4 times daily (Patient not taking: Reported on 03/10/2024) 350 g 5    vitamin D 25 MCG (1000 UT) CAPS Take 1 capsule by mouth daily      vitamin C (ASCORBIC ACID) 500 MG tablet Take 1 tablet by mouth daily      Multiple Vitamins-Minerals (MULTIVITAMIN ADULTS 50+ PO) Take 1 tablet by mouth daily      Misc Natural Products (NEURIVA PO) Take 1 tablet by mouth in the morning and at bedtime      NONFORMULARY Take 1 tablet by mouth every evening Viviscal -1 tablet every evening for hair and scalp      potassium chloride  (KLOR-CON  M) 10 MEQ extended release tablet TAKE 1 TABLET EVERY DAY 90 tablet  3    albuterol  sulfate HFA (PROVENTIL ;VENTOLIN ;PROAIR ) 108 (90 Base) MCG/ACT inhaler Inhale 2 puffs into the lungs every 4 hours as needed for Wheezing or Shortness of Breath 18 g 5    cyanocobalamin 1000 MCG tablet Take 1 tablet by mouth every evening      ZINC PO Take 1 tablet by mouth daily      latanoprost (XALATAN) 0.005 % ophthalmic solution Place 1 drop into both eyes nightly      lidocaine  (LIDODERM ) 5 % Place 1 patch onto the skin every 24 hours 12 hours on and 12 hours off       No current facility-administered medications for this visit.       Physical Exam  Constitutional:       Appearance: Normal appearance.   HENT:      Head: Normocephalic and atraumatic.      Mouth/Throat:      Mouth: Mucous membranes are moist.      Pharynx: Oropharynx is clear. No oropharyngeal exudate.   Eyes:      Extraocular Movements: Extraocular movements intact.      Pupils: Pupils are equal, round, and reactive to light.   Pulmonary:      Effort: Pulmonary effort is normal.      Breath sounds: Normal breath sounds.   Musculoskeletal:         General: Normal range of motion.      Cervical back: Normal range of motion and neck supple.      Right lower leg: No edema.      Left lower leg: No edema.   Neurological:      Mental Status: She is alert.      Comments: Mental status: Awake, alert, oriented x3,  follows simple and complex commands, no neglect, no extinction to DSS or VSS, immediate recall 3/3 and delayed recall 3/3.  Speech and languge: fluent, coherent,  and comprehension intact  CN: VFF, EOMI, PERRLA, face sensation intact , no facial asymmetry noted, palate elevation symmetric bilat, SS+SCM 5/5 bilat, tongue midline  Motor: no pronator drift, tone normal throughout, strength 5/5 throughout  Sensory: intact to light touch throughout  Coordination: FNF  accurate w/o dysmetria  DTR: 2+ throughout  Gait:  antalgic otherwise normal.            Hospital Outpatient Visit on 06/23/2024   Component Date Value Ref Range Status    Calcium  06/23/2024 9.3  8.5 - 10.1 MG/DL Final    Pth Intact 92/81/7974 36.4  15.0 - 65.0 pg/mL Final    Comment:    Calcium  within the reference range and intact PTH below the analytical measurement range suggestive of primary hypoparathyroidism.     Elevated calcium  and intact PTH below the analytical measurement range suggestive of hypercalcemia of malignancy.     Elevated calcium  and intact PTH greater than the midpoint of the reference range suggestive of primary hyperparathyroidism.      Albumin Urine 06/23/2024 11.00 (H)  0.0 - 3.0 MG/DL Final    Creatinine, Ur 06/23/2024 213.00 (H)  30 - 125 mg/dL Final    Albumin/Creatinine Ratio 06/23/2024 52 (H)  0 - 30 mg/g Final    WBC 06/23/2024 4.1 (L)  4.6 - 13.2 K/uL Final    RBC 06/23/2024 3.66 (L)  4.20 - 5.30 M/uL Final    Hemoglobin 06/23/2024 9.9 (L)  12.0 - 16.0 g/dL Final    Hematocrit 92/81/7974 31.8 (L)  35.0 - 45.0 % Final  MCV 06/23/2024 86.9  78.0 - 100.0 FL Final    MCH 06/23/2024 27.0  24.0 - 34.0 PG Final    MCHC 06/23/2024 31.1  31.0 - 37.0 g/dL Final    RDW 92/81/7974 16.1 (H)  11.6 - 14.5 % Final    Platelets 06/23/2024 243  135 - 420 K/uL Final    MPV 06/23/2024 11.0  9.2 - 11.8 FL Final    Nucleated RBCs 06/23/2024 0.0  0 PER 100 WBC Final    nRBC 06/23/2024 0.00  0.00 - 0.01 K/uL Final    Vit D, 25-Hydroxy  06/23/2024 52.9  30.0 - 100.0 ng/mL Final    Comment: Deficiency         <20.0 ng/mL  Insufficiency       20.0-29.9 ng/mL      Sodium 06/23/2024 146 (H)  136 - 145 mmol/L Final    Potassium 06/23/2024 3.8  3.5 - 5.5 mmol/L Final    Chloride 06/23/2024 108 (H)  98 - 107 mmol/L Final    CO2 06/23/2024 27  21 - 32 mmol/L Final    Anion Gap 06/23/2024 12  3.0 - 18.0 mmol/L Final    Glucose 06/23/2024 86  74 - 108 mg/dL Final    Comment: <29 mg/dL Consistent with, but not fully diagnostic of hypoglycemia.  100 - 125 mg/dL Impaired fasting glucose/consistent with pre-diabetes mellitus.  > 126 mg/dl Fasting glucose consistent with overt diabetes mellitus      BUN 06/23/2024 7  6 - 23 MG/DL Final    Creatinine 92/81/7974 0.63  0.6 - 1.3 MG/DL Final    BUN/Creatinine Ratio 06/23/2024 11 (L)  12 - 20   Final    Est, Glom Filt Rate 06/23/2024 >90  >60 ml/min/1.66m2 Final    Comment:    Pediatric calculator link: https://www.kidney.org/professionals/kdoqi/gfr_calculatorped     These results are not intended for use in patients <34 years of age.     eGFR results are calculated without a race factor using  the 2021 CKD-EPI equation. Careful clinical correlation is recommended, particularly when comparing to results calculated using previous equations.  The CKD-EPI equation is less accurate in patients with extremes of muscle mass, extra-renal metabolism of creatinine, excessive creatine ingestion, or following therapy that affects renal tubular secretion.      Calcium  06/23/2024 9.4  8.5 - 10.1 MG/DL Final    Phosphorus 92/81/7974 3.3  2.5 - 4.9 MG/DL Final    Albumin 92/81/7974 3.6  3.4 - 5.0 g/dL Final    Protein, Urine, Random 06/23/2024 35 (H)  0 - 12 mg/dL Final    Creatinine, Ur 06/23/2024 214.00 (H)  30 - 125 mg/dL Final    Uric Acid 92/81/7974 3.9  2.6 - 7.2 MG/DL Final    Color, UA 92/81/7974 YELLOW    Final    Appearance 06/23/2024 CLOUDY    Final    Specific Gravity, UA 06/23/2024 1.026  1.005 - 1.030   Final    pH,  Urine 06/23/2024 5.5  5.0 - 8.0   Final    Protein, UA 06/23/2024 30 (A)  NEG mg/dL Final    Glucose, Ur 92/81/7974 Negative  NEG mg/dL Final    Ketones, Urine 06/23/2024 TRACE (A)  NEG mg/dL Final    Bilirubin, Urine 06/23/2024 Negative  NEG   Final    Blood, Urine 06/23/2024 Negative  NEG   Final    Urobilinogen, Urine 06/23/2024 1.0  0.2 - 1.0 EU/dL Final    Nitrite, Urine 06/23/2024  Negative  NEG   Final    Leukocyte Esterase, Urine 06/23/2024 MODERATE (A)  NEG   Final    WBC, UA 06/23/2024 4-10  0 - 5 /hpf Final    RBC, UA 06/23/2024 NONE  0 - 5 /hpf Final    Epithelial Cells, UA 06/23/2024 3+  0 - 5 /lpf Final    BACTERIA, URINE 06/23/2024 FEW (A)  NEG /hpf Final    Alk Phosphatase 06/23/2024 70  45 - 117 U/L Final    Magnesium 06/23/2024 1.9  1.6 - 2.6 mg/dL Final     MRI of the brain without contrast: Nov, 2023:      TECHNIQUE: AXIAL FLAIR, T1, T2, and T2 weighted gradient echo images were  obtained with sagittal T1 weighted images. And axial diffusion weighted sequence  was also obtained. No intravenous contrast was administered.     FINDINGS: No intracranial mass lesion, hemorrhage, or territorial infarction is  demonstrated. There is mild cerebral atrophy. There are foci of increased signal  intensity on long TR images suggestive of mild microvascular ischemic changes.  The microvascular ischemic changes are relatively more advanced on the current  study than the MRI done on 02/01/2018. The cerebellar tonsils extend  approximately 8 mm inferior to the level of the foramen magnum compatible with a  Chiari I malformation. No significant cranial cervical stenosis is noted.  Diffusion-weighted images show no regions of restricted diffusion. No subfalcine  or transtentorial herniation is noted.     IMPRESSION:  Findings suggestive of mild microvascular ischemic changes. The  microvascular ischemic changes are relatively more advanced on the current study  than the MRI done on 02/01/2018. Chiari I  malformation.      Assessment:  1. Chiari malformation type I (HCC)  - MRI BRAIN WO CONTRAST; Future  2. Unsteadiness  - MRI BRAIN WO CONTRAST; Future  Patient has a diagnosis of Chiari malformation since around 2010; at the time of ACDF for cervical spondylosis.  Was not interested in surgery; but was seen by neurosurgery.  Last MRI of the brain from October 2023 as above: 8 mm descent of the cerebellar tonsils below the foramen magnum; no hydrocephalus.  Previous MRI from February 2019 reported descent of by 12 mm.  Patient feels unsteady/off-balance.  No occipital pain.  No pain with straining/coughing.  No syncopal spells.  No problem swallowing; no hoarseness of voice.  Will repeat her brain MRI without contrast.  Will see her again in 3 months time.        PLEASE NOTE:   This document has been produced using voice recognition software. Unrecognized errors in transcription may be present.

## 2024-08-01 DIAGNOSIS — R0689 Other abnormalities of breathing: Secondary | ICD-10-CM | POA: Diagnosis not present

## 2024-08-01 DIAGNOSIS — E119 Type 2 diabetes mellitus without complications: Secondary | ICD-10-CM | POA: Diagnosis not present

## 2024-08-01 DIAGNOSIS — Z743 Need for continuous supervision: Secondary | ICD-10-CM | POA: Diagnosis not present

## 2024-08-01 DIAGNOSIS — Z79899 Other long term (current) drug therapy: Secondary | ICD-10-CM | POA: Diagnosis not present

## 2024-08-01 DIAGNOSIS — R42 Dizziness and giddiness: Secondary | ICD-10-CM | POA: Diagnosis not present

## 2024-08-01 DIAGNOSIS — G9389 Other specified disorders of brain: Secondary | ICD-10-CM | POA: Diagnosis not present

## 2024-08-01 DIAGNOSIS — Z885 Allergy status to narcotic agent status: Secondary | ICD-10-CM | POA: Diagnosis not present

## 2024-08-01 DIAGNOSIS — E785 Hyperlipidemia, unspecified: Secondary | ICD-10-CM | POA: Diagnosis not present

## 2024-08-01 DIAGNOSIS — F419 Anxiety disorder, unspecified: Secondary | ICD-10-CM | POA: Diagnosis not present

## 2024-08-01 DIAGNOSIS — I498 Other specified cardiac arrhythmias: Secondary | ICD-10-CM | POA: Diagnosis not present

## 2024-08-01 DIAGNOSIS — R531 Weakness: Secondary | ICD-10-CM | POA: Diagnosis not present

## 2024-08-01 DIAGNOSIS — Z7901 Long term (current) use of anticoagulants: Secondary | ICD-10-CM | POA: Diagnosis not present

## 2024-08-01 DIAGNOSIS — C189 Malignant neoplasm of colon, unspecified: Secondary | ICD-10-CM | POA: Diagnosis not present

## 2024-08-01 DIAGNOSIS — M109 Gout, unspecified: Secondary | ICD-10-CM | POA: Diagnosis not present

## 2024-08-01 DIAGNOSIS — I10 Essential (primary) hypertension: Secondary | ICD-10-CM | POA: Diagnosis not present

## 2024-08-02 DIAGNOSIS — F419 Anxiety disorder, unspecified: Secondary | ICD-10-CM | POA: Diagnosis not present

## 2024-08-02 DIAGNOSIS — I498 Other specified cardiac arrhythmias: Secondary | ICD-10-CM | POA: Diagnosis not present

## 2024-08-02 DIAGNOSIS — R42 Dizziness and giddiness: Secondary | ICD-10-CM | POA: Diagnosis not present

## 2024-08-03 DIAGNOSIS — M25562 Pain in left knee: Secondary | ICD-10-CM | POA: Diagnosis not present

## 2024-08-03 DIAGNOSIS — Z09 Encounter for follow-up examination after completed treatment for conditions other than malignant neoplasm: Secondary | ICD-10-CM | POA: Diagnosis not present

## 2024-08-03 DIAGNOSIS — R42 Dizziness and giddiness: Secondary | ICD-10-CM | POA: Diagnosis not present

## 2024-08-03 DIAGNOSIS — M25561 Pain in right knee: Secondary | ICD-10-CM | POA: Diagnosis not present

## 2024-08-10 ENCOUNTER — Encounter: Payer: Medicare (Managed Care) | Primary: Internal Medicine

## 2024-08-15 ENCOUNTER — Ambulatory Visit: Payer: Medicare (Managed Care) | Primary: Internal Medicine

## 2024-08-15 ENCOUNTER — Inpatient Hospital Stay: Admit: 2024-08-15 | Payer: Medicare (Managed Care) | Primary: Internal Medicine

## 2024-08-15 DIAGNOSIS — E890 Postprocedural hypothyroidism: Principal | ICD-10-CM

## 2024-08-15 LAB — CBC WITH AUTO DIFFERENTIAL
Basophils %: 0.2 % (ref 0.0–2.0)
Basophils Absolute: 0.01 K/UL (ref 0.00–0.10)
Eosinophils %: 1.2 % (ref 0.0–5.0)
Eosinophils Absolute: 0.07 K/UL (ref 0.00–0.40)
Hematocrit: 31.1 % — ABNORMAL LOW (ref 35.0–45.0)
Hemoglobin: 9.8 g/dL — ABNORMAL LOW (ref 12.0–16.0)
Immature Granulocytes %: 0.5 % (ref 0.0–0.5)
Immature Granulocytes Absolute: 0.03 K/UL (ref 0.00–0.04)
Lymphocytes %: 15.4 % — ABNORMAL LOW (ref 21.0–52.0)
Lymphocytes Absolute: 0.87 K/UL — ABNORMAL LOW (ref 0.90–3.60)
MCH: 26.8 pg (ref 24.0–34.0)
MCHC: 31.5 g/dL (ref 31.0–37.0)
MCV: 85.2 FL (ref 78.0–100.0)
MPV: 10.5 FL (ref 9.2–11.8)
Monocytes %: 6.9 % (ref 3.0–10.0)
Monocytes Absolute: 0.39 K/UL (ref 0.05–1.20)
Neutrophils %: 75.8 % — ABNORMAL HIGH (ref 40.0–73.0)
Neutrophils Absolute: 4.27 K/UL (ref 1.80–8.00)
Nucleated RBCs: 0 /100{WBCs}
Platelets: 343 K/uL (ref 135–420)
RBC: 3.65 M/uL — ABNORMAL LOW (ref 4.20–5.30)
RDW: 14.6 % — ABNORMAL HIGH (ref 11.6–14.5)
WBC: 5.6 K/uL (ref 4.6–13.2)
nRBC: 0 K/uL (ref 0.00–0.01)

## 2024-08-15 LAB — COMPREHENSIVE METABOLIC PANEL
ALT: 17 U/L (ref 10–35)
AST: 19 U/L (ref 10–38)
Albumin/Globulin Ratio: 1.4 (ref 0.8–1.7)
Albumin: 3.6 g/dL (ref 3.4–5.0)
Alk Phosphatase: 86 U/L (ref 45–117)
Anion Gap: 11 mmol/L (ref 3.0–18.0)
BUN/Creatinine Ratio: 13 (ref 12–20)
BUN: 8 mg/dL (ref 6–23)
CO2: 30 mmol/L (ref 21–32)
Calcium: 9.8 mg/dL (ref 8.5–10.1)
Chloride: 103 mmol/L (ref 98–107)
Creatinine: 0.64 mg/dL (ref 0.6–1.3)
Est, Glom Filt Rate: >90 ml/min/1.73m2 (ref >60)
Globulin: 2.6 g/dL (ref 2.0–4.0)
Glucose: 102 mg/dL (ref 74–108)
Potassium: 3.4 mmol/L — ABNORMAL LOW (ref 3.5–5.5)
Sodium: 144 mmol/L (ref 136–145)
Total Bilirubin: 0.5 mg/dL (ref 0.2–1.0)
Total Protein: 6.2 g/dL — ABNORMAL LOW (ref 6.4–8.2)

## 2024-08-15 LAB — LIPID PANEL
Chol/HDL Ratio: 2.8 (ref 0–5.0)
Cholesterol, Total: 148 mg/dL
HDL: 52 mg/dL (ref 40–60)
LDL Cholesterol: 76 mg/dL (ref 0–100)
Triglycerides: 97 mg/dL (ref 0–150)
VLDL Cholesterol Calculated: 19 mg/dL

## 2024-08-15 LAB — TSH + FREE T4 PANEL
T4 Free: 1.2 ng/dL (ref 0.9–1.7)
TSH, 3rd Generation: 4.1 u[IU]/mL (ref 0.27–4.20)

## 2024-08-15 LAB — VITAMIN D 25 HYDROXY: Vit D, 25-Hydroxy: 53.1 ng/mL (ref 30.0–100.0)

## 2024-08-15 LAB — HEMOGLOBIN A1C
Estimated Avg Glucose: 109 mg/dL
Hemoglobin A1C: 5.4 % (ref 4.2–5.6)

## 2024-08-16 ENCOUNTER — Inpatient Hospital Stay: Admit: 2024-08-16 | Payer: Medicare (Managed Care) | Primary: Internal Medicine

## 2024-08-16 DIAGNOSIS — G935 Compression of brain: Principal | ICD-10-CM

## 2024-08-17 ENCOUNTER — Encounter: Payer: Medicare (Managed Care) | Attending: Internal Medicine | Primary: Internal Medicine

## 2024-08-22 ENCOUNTER — Ambulatory Visit
Admit: 2024-08-22 | Discharge: 2024-08-22 | Payer: Medicare (Managed Care) | Attending: Internal Medicine | Primary: Internal Medicine

## 2024-08-22 VITALS — BP 154/74 | HR 88 | Temp 98.50000°F | Resp 20 | Ht 62.0 in | Wt 155.0 lb

## 2024-08-22 DIAGNOSIS — I10 Essential (primary) hypertension: Principal | ICD-10-CM

## 2024-08-22 MED ORDER — GABAPENTIN 300 MG PO CAPS
300 | ORAL_CAPSULE | Freq: Three times a day (TID) | ORAL | 1 refills | 30.00000 days | Status: AC
Start: 2024-08-22 — End: 2025-02-18

## 2024-08-22 NOTE — Progress Notes (Addendum)
 Elaine [name] presents today for   Chief Complaint   Patient presents with    Cholesterol Problem    Anemia    Thyroid Problem     6 month follow up            Have you been to the ER, urgent care clinic since your last visit?  Hospitalized since your last visit?    NO    "Have you seen or consulted any other health care providers outside of [facility] since your last visit?"    NO       Verbal order read back per Dr. Hunte Flu Vaccine.  Patient received Flu vaccine in right deltoid. Patient was observed and no sign or symptoms of an allergic reaction noted at this time.  Patient tolerated well and left without complaints.  Patient received flu VIS.

## 2024-08-22 NOTE — Progress Notes (Signed)
 Elaine [name] is a 73 y.o.SABRA  female and presents with Hypertension, Cholesterol Problem, Thyroid Problem (/), and Vitamin D Deficiency      SUBJECTIVE:  [name] HTN is uncontrolled on   Norvasc  5  mg, Diovan  320 mg. She was on aldactone 25 mg in the past .  [name] high cholesterol  is well controlled on Lipitor 40 mg. Pt  denies any side effects like myalgias.  Pt has been controlling her prediabetes with diet and weight loss.  Patient daughter has been helping her to be more compliant with taking her medications.  .     Thyroid Review:  Patient is seen for followup of hypothyroidism.   Thyroid ROS: denies fatigue, weight changes, heat/cold intolerance, bowel/skin changes or CVS symptoms. 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  Patient's TSH level is better  on Synthroid  88 mcg daily.      Patient has history of vitamin D deficiency  that is controlled on just MV currently.   Patient has sleep apnea and is using a CPAP machine and followed by sleep specialist.    Patient has asthma that is controlled on Singulair  and  trelegy Ellipta.  Patient is followed by pulmonary who also sees her for sleep apnea.      Patient has chronic diastolic heart failure which is stable and followed by cardiology    Patient has an atrophic right kidney which may have been going on for several years if not since birth.  Duplex renal ultrasound shows Eleveated velocity identified in the mid left renal artery in the presence of tortousity. No hemodynamically significant stenosis involving the right renal artery.  Patient is followed by nephrology.  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  She has had EGD/colonoscopy and pill endoscopy in the past.  Patient unable to use aspirin due to this  reason. Last colonoscopy was 2021 with 7 year f/u 2027    Patient has a history of Chiari malformation type I and has been referred to neurology and had MRI of brain..    Patient plans to do cataract surgery on her left eye with Dr. Althea at the end of October.  Patient is at low risk for this low risk surgery and is medically cleared to proceed.    Respiratory ROS: negative for - shortness of breath  Cardiovascular ROS: negative for - chest pain      Current Outpatient Medications   Medication Sig    gabapentin  (NEURONTIN ) 300 MG capsule Take 1 capsule by mouth 3 times daily for 180 days. Max Daily Amount: 900 mg    atorvastatin  (LIPITOR) 40 MG tablet TAKE 1 TABLET EVERY DAY (NEED MD APPOINTMENT)    levothyroxine  (SYNTHROID ) 88 MCG tablet Take 1 tablet by mouth Daily    valsartan  (DIOVAN ) 320 MG tablet Take 1 tablet by mouth daily    amLODIPine  (NORVASC ) 5 MG tablet Take 1 tablet by mouth daily    moxifloxacin (VIGAMOX) 0.5 % ophthalmic solution instill 1 drop into right eye four times a day START 3 days prior to procedure    pantoprazole  (PROTONIX ) 40 MG tablet 1 tab(s) orally once a day    prednisoLONE acetate (PRED FORTE) 1 % ophthalmic suspension instill 1 drop into right  eye 4 times daily starting 3 days prior to procedure    montelukast  (SINGULAIR ) 10 MG tablet TAKE 1 TABLET EVERY DAY    vitamin C (ASCORBIC ACID) 500 MG tablet Take 1 tablet by mouth daily    Multiple Vitamins-Minerals (MULTIVITAMIN ADULTS 50+ PO) Take 1 tablet by mouth daily    Misc Natural Products (NEURIVA PO) Take 1 tablet by mouth in the morning and at bedtime    NONFORMULARY Take 1 tablet by mouth every evening Viviscal -1 tablet every evening for hair and scalp    potassium chloride  (KLOR-CON  M) 10 MEQ extended release tablet TAKE 1 TABLET EVERY DAY    albuterol  sulfate HFA (PROVENTIL ;VENTOLIN ;PROAIR ) 108 (90 Base) MCG/ACT inhaler Inhale 2 puffs into the lungs every 4 hours as needed for Wheezing or Shortness of Breath     cyanocobalamin 1000 MCG tablet Take 1 tablet by mouth every evening    ZINC PO Take 1 tablet by mouth daily    latanoprost (XALATAN) 0.005 % ophthalmic solution Place 1 drop into both eyes nightly    lidocaine  (LIDODERM ) 5 % Place 1 patch onto the skin every 24 hours 12 hours on and 12 hours off    diclofenac  sodium (VOLTAREN ) 1 % GEL Apply 4 g topically 4 times daily (Patient not taking: Reported on 08/22/2024)     No current facility-administered medications for this visit.           OBJECTIVE:  alert, well appearing, and in no distress    Visit Vitals  BP (!) 154/74 (BP Site: Right Upper Arm, Patient Position: Sitting, BP Cuff Size: Large Adult)   Pulse 88   Temp 98.5 F (36.9 C) (Oral)   Resp 20   Ht 1.575 m (5' 2)   Wt 70.3 kg (155 lb)   SpO2 99%   BMI 28.35 kg/m          well developed and well nourished  Chest - clear to auscultation, no wheezes, rales or rhonchi, symmetric air entry  Heart - normal rate, regular rhythm, normal S1, S2, no murmurs, rubs, clicks or gallops  Extremities - peripheral pulses normal, no pedal edema, no clubbing or cyanosis    CMP:    Lab Results   Component Value Date/Time    NA 144 08/15/2024 04:04 PM    K 3.4 08/15/2024 04:04 PM    CL 103 08/15/2024 04:04 PM    CO2 30 08/15/2024 04:04 PM    BUN 8 08/15/2024 04:04 PM    CREATININE 0.64 08/15/2024 04:04 PM    GFRAA >60 05/26/2021 08:05 AM    AGRATIO 1.7 03/09/2023 12:00 AM    LABGLOM >90 08/15/2024 04:04 PM    LABGLOM 96 03/09/2023 12:00 AM    GLUCOSE 102 08/15/2024 04:04 PM    GLUCOSE 97 09/24/2023 12:00 AM    LABALBU 4.1 03/09/2023 12:00 AM    CALCIUM  9.8 08/15/2024 04:04 PM    BILITOT 0.5 08/15/2024 04:04 PM    ALKPHOS 86 08/15/2024 04:04 PM    ALKPHOS 87 09/24/2023 12:00 AM    AST 19 08/15/2024 04:04 PM    ALT 17 08/15/2024 04:04 PM     HgBA1c:    Lab Results   Component Value Date/Time    LABA1C 5.4 08/15/2024 04:04 PM     FLP:    Lab Results   Component Value Date/Time    TRIG 97 08/15/2024 04:04 PM    HDL 52 08/15/2024  04:04 PM     TSH:  Lab Results   Component Value Date/Time    TSH 4.100 08/15/2024 04:04 PM         Discussed the patient's BMI with her.  The BMI follow up plan is as follows: I have counseled this patient on diet and exercise regimens.        Assessment/Plan    ICD-10-CM    1. Essential hypertension  I10 Uncontrolled on Norvasc  5 mg and Diovan  320 mg daily.  If blood pressure not improving at home would consider restarting Aldactone 25 mg.  Comprehensive Metabolic Panel      2. High cholesterol  E78.00 Well-controlled Lipitor 40 mg daily lipid Panel      3. Iron deficiency anemia due to chronic blood loss  D50.0 Stable on iron supplementation which is managed by hematology CBC with Auto Differential      4. Chronic midline low back pain, unspecified whether sciatica present  M54.50 gabapentin  (NEURONTIN ) 300 MG capsule    G89.29       5. Vitamin D deficiency  E55.9 Well-controlled on just multivitamin Vitamin D 25 Hydroxy      6. Postprocedural hypothyroidism  E89.0 Well-controlled on Synthroid  88 mcg TSH + Free T4 Panel      7. Cortical age-related cataract of left eye  H25.012 Patient at low risk for this low risk surgery and is medically cleared to proceed    Dr Althea 10/28      8. Chronic diastolic (congestive) heart failure (I50.32)  I50.32 Currently stable with patient managed by cardiology           Follow-up and Dispositions    Ms.Return in about 3 months (around 11/21/2024) for OV, Medicare Wellness, labs 1 week before.             Reviewed plan of care. Patient has provided input and agrees with goals.

## 2024-09-01 DIAGNOSIS — Z885 Allergy status to narcotic agent status: Secondary | ICD-10-CM | POA: Diagnosis not present

## 2024-09-01 DIAGNOSIS — Z08 Encounter for follow-up examination after completed treatment for malignant neoplasm: Secondary | ICD-10-CM | POA: Diagnosis not present

## 2024-09-01 DIAGNOSIS — R9431 Abnormal electrocardiogram [ECG] [EKG]: Secondary | ICD-10-CM | POA: Diagnosis not present

## 2024-09-01 DIAGNOSIS — R079 Chest pain, unspecified: Secondary | ICD-10-CM | POA: Diagnosis not present

## 2024-09-01 DIAGNOSIS — Z8673 Personal history of transient ischemic attack (TIA), and cerebral infarction without residual deficits: Secondary | ICD-10-CM | POA: Diagnosis not present

## 2024-09-01 DIAGNOSIS — Z85038 Personal history of other malignant neoplasm of large intestine: Secondary | ICD-10-CM | POA: Diagnosis not present

## 2024-09-01 DIAGNOSIS — E860 Dehydration: Secondary | ICD-10-CM | POA: Diagnosis not present

## 2024-09-01 DIAGNOSIS — Z743 Need for continuous supervision: Secondary | ICD-10-CM | POA: Diagnosis not present

## 2024-09-01 DIAGNOSIS — R197 Diarrhea, unspecified: Secondary | ICD-10-CM | POA: Diagnosis not present

## 2024-09-27 DIAGNOSIS — R197 Diarrhea, unspecified: Secondary | ICD-10-CM | POA: Diagnosis not present

## 2024-09-27 DIAGNOSIS — Z885 Allergy status to narcotic agent status: Secondary | ICD-10-CM | POA: Diagnosis not present

## 2024-09-27 DIAGNOSIS — Z0289 Encounter for other administrative examinations: Secondary | ICD-10-CM | POA: Diagnosis not present

## 2024-09-27 DIAGNOSIS — E119 Type 2 diabetes mellitus without complications: Secondary | ICD-10-CM | POA: Diagnosis not present

## 2024-09-27 DIAGNOSIS — Z85038 Personal history of other malignant neoplasm of large intestine: Secondary | ICD-10-CM | POA: Diagnosis not present

## 2024-09-27 DIAGNOSIS — R7989 Other specified abnormal findings of blood chemistry: Secondary | ICD-10-CM | POA: Diagnosis not present

## 2024-09-27 DIAGNOSIS — E785 Hyperlipidemia, unspecified: Secondary | ICD-10-CM | POA: Diagnosis not present

## 2024-09-27 DIAGNOSIS — Z8639 Personal history of other endocrine, nutritional and metabolic disease: Secondary | ICD-10-CM | POA: Diagnosis not present

## 2024-09-27 DIAGNOSIS — R799 Abnormal finding of blood chemistry, unspecified: Secondary | ICD-10-CM | POA: Diagnosis not present

## 2024-09-27 DIAGNOSIS — I10 Essential (primary) hypertension: Secondary | ICD-10-CM | POA: Diagnosis not present

## 2024-10-04 DIAGNOSIS — C182 Malignant neoplasm of ascending colon: Secondary | ICD-10-CM | POA: Diagnosis not present

## 2024-10-04 DIAGNOSIS — Z5112 Encounter for antineoplastic immunotherapy: Secondary | ICD-10-CM | POA: Diagnosis not present

## 2024-10-06 DIAGNOSIS — Z452 Encounter for adjustment and management of vascular access device: Secondary | ICD-10-CM | POA: Diagnosis not present

## 2024-10-07 DIAGNOSIS — Z5112 Encounter for antineoplastic immunotherapy: Secondary | ICD-10-CM | POA: Diagnosis not present

## 2024-10-07 DIAGNOSIS — C182 Malignant neoplasm of ascending colon: Secondary | ICD-10-CM | POA: Diagnosis not present

## 2024-10-09 MED ORDER — MONTELUKAST SODIUM 10 MG PO TABS
10 | ORAL_TABLET | Freq: Every day | ORAL | 3 refills | 90.00000 days | Status: AC
Start: 2024-10-09 — End: ?

## 2024-10-09 NOTE — Telephone Encounter (Signed)
"  Last OV: 08/22/2024  Next OV: 11/21/2024  Last refill: 12/20/2023     "

## 2024-10-12 ENCOUNTER — Ambulatory Visit
Admit: 2024-10-12 | Discharge: 2024-10-12 | Payer: Medicare (Managed Care) | Attending: Orthopaedic Surgery | Primary: Internal Medicine

## 2024-10-12 VITALS — Ht 62.0 in | Wt 159.0 lb

## 2024-10-12 DIAGNOSIS — M1611 Unilateral primary osteoarthritis, right hip: Principal | ICD-10-CM

## 2024-10-12 NOTE — Progress Notes (Signed)
 "        Patient: Elaine [name]                MRN: [id number]       SSN: [id number]  Date of Birth: 14-Oct-1951        AGE: 73 y.o.        SEX: female    PCP: Waldon Marcus CROME, MD  10/12/24    Chief Complaint: Hip Pain (Right hip )      1. Primary osteoarthritis of right hip  -     AMB POC X-RAY RADEX HIP UNI WITH PELVIS 2-3 VIEWS  -     [facility] [place]          HPI:  Elaine [name] is a 73 y.o. female with chief complaint of   Chief Complaint   Patient presents with    Hip Pain     Lt      -05/10/2023: Left knee cortisone injection, 2 months of excellent relief  -01/08/2023: Left total hip arthroplasty, posterior lateral approach    We have been planning on a left total hip arthroplasty through the anterolateral approach due to some pain in the lateral hip as well as a overhanging pannus.  She recently, in July, had a GI bleed and has had a cascade of medical issues since then.  She is hoping that she can have her hip replaced at the beginning of the year.        08/16/2023     9:15 AM   AMB PAIN ASSESSMENT   Location of Pain Knee   Location Modifiers Left   Severity of Pain 0     Tobacco Use: Medium Risk (10/12/2024)    Patient History     Smoking Tobacco Use: Former     Smokeless Tobacco Use: Never     Passive Exposure: Never     IMAGING:  Imaging read by myself and interpreted as follows:    October 12, 2024:  Three-view x-ray of the right hip including AP pelvis and AP and crosstable lateral of the right hip taken at the [facility] office demonstrates approximately 50% joint space narrowing of the right hip with small osteophytes and mild subchondral sclerosis.  There are no intraosseous abnormalities.  There is some evidence of disc space narrowing and mild to moderate arthritic changes in the partially visualized lower lumbar spine.      May 10, 2023:  4 view x-ray of the left knee including AP, lateral, sunrise and notch views demonstrate tricompartmental osteoarthritic  signs most affecting the lateral compartment which has bone-on-bone articulation with subchondral cysts and subchondral sclerosis.  There are tricompartmental osteophytes.  There is also subchondral sclerosis and mild lateral patellar tilt in the patellofemoral joint.      February 01, 2023:  Three-view x-ray of the left hip including AP pelvis and AP and lateral of the left hip demonstrates maintenance of the position of the implants without change in position or evidence of subsidence or fracture.  No evidence of complication.  The left leg is approximately centimeter longer than the right with mildly increased offset.      January 18, 2023:  3 view x-ray of the left hip including AP pelvis, AP and lateral, demonstrates a well positioned left total hip arthroplasty without signs of loosening or complication. Leg lengths appear to be equal.     Dec 08, 2022:  3 view x-ray of  the left hip including AP pelvis, and AP and lateral demonstrating severe joint space narrowing with evidence of osteophytosis, subchondral sclerosis, and subchondral cyst formation.     2 view x-ray of the lumbar spine, sitting and standing, demonstrates facet joint arthropathy and decreased disc space at multiple levels. The change in SS is 21.2.     September 25, 2022:  3 view x-ray of the left hip including AP pelvis and AP and crosstable lateral of the left hip demonstrate significant joint space loss acetabular joint of approximately 90%.  There is osteophytosis,  subchondral sclerosis and subchondral cyst formation.      PHYSICAL EXAMINATION:  Ht 1.575 m (5' 2)   Wt 72.1 kg (159 lb)   BMI 29.08 kg/m   Body mass index is 29.08 kg/m.  Wt Readings from Last 3 Encounters:   10/12/24 72.1 kg (159 lb)   08/22/24 70.3 kg (155 lb)   08/16/24 71.2 kg (157 lb)       GENERAL: Alert and oriented x3, in no acute distress.  HEENT: Normocephalic, atraumatic.    MSK:   Examination Right hip Left hip   Skin Intact Intact   Flexion ROM 100 100    Extension ROM 0 0   External Rotation ROM 45 30   Internal Rotation ROM 20 10   Abduction ROM 30 30   Radicular tension - -   FADIR - -   FABER - -   Log roll test - -   Trochanteric tenderness + -   Weakness - -   Neurovascular Intact Intact   Negative Trendelenburg sign      ASSESSMENT & PLAN:    October 12, 2024:  Right hip trochanteric bursitis.  She is doing very well with her left hip replacement nearly 2 years out.  I will get her into formal physical therapy for her right hip.  She already uses Tylenol  and lidocaine  patches.  I can see her back on an as-needed basis.  We did talk about the possibility of cortisone injections but considering her pain usually only starts in the morning and dissipates about an hour after she wakes up we decided that physical therapy would be a better start.     August 16, 2023:  Left knee osteoarthritis and status post left total hip arthroplasty of the posterior lateral approach.  She continues to be doing very well with the left hip.  The left knee has not been bothering her.  She got excellent relief from her knee shots for about 2 months but it is still not as bad as it was before the shot.  I discussed gel injections with her and she would like to move forward with those in the hopes of having a longer lasting relief as well as not risking elevation in her blood sugar with her diabetes.  I will have her back when the shots are approved.  Of note she is also complaining of significant left shoulder pain and is having restricted range of motion.  She uses patches continuously to help with that pain.  I will refer to Dr. Luciano for further evaluation.      May 10, 2023:  Left knee osteoarthritis and 42-month status post left total hip arthroplasty through posterior lateral approach.  She is having no pain in her hip but her knee is bothering her quite a bit.  She will get sharp pains in from time to time.  It hurts in both the medial and  lateral sides of the joint.  We  talked about over-the-counter medications and she is already taking Tylenol  and that knee continues to hurt.  Due to her history of GI bleed she cannot take NSAIDs.  I offered her a cortisone injection and she would like to move forward with it.  We did discuss that with her prediabetes it can elevate her blood sugar and potentially her A1c slightly.  The shot was given and she tolerated it well.  I like to see her back and 3 months for reevaluation of the left knee.  I did prescribe her Voltaren  gel.    Apr 12, 2023:  3 months status post left total hip arthroplasty, posterolateral approach.  Assessment: Patient presents today after calling after-hours services this weekend due to swelling in her left ankle and foot.  She denies ever having any pain or redness, simply swelling.  She was instructed to go to the emergency department if she began to develop shortness of breath or calf pain/swelling/discoloration.  She denies ever feeling the symptoms but was instructed to come into the office to be seen on Monday.  She is in no pain today and states that she never hit her ankle or anything and does not remember an injury.  She has no tenderness to palpation just a small area of effusion just anterior to her lateral malleolus.  Since she called her swelling has improved by almost 90%.  She denies ever having any hip pain or pain down her leg.  Plan: Original plan for home PT and then transition to physical therapy  Follow up: In 2 months to check on her progress.  No x-rays will be needed.      March 01, 2023:  6-week status post left total hip arthroplasty through posterior lateral approach.  She is doing very well and she is very happy.  She is not having any pain and she is able to ambulate short distances without her rollator but is not completely steady on her feet yet.  She has graduated from home physical therapy and is starting outpatient physical therapy later this week.  I like to see her again in 2 months to  check on her progress.  No x-rays needed at that visit.      February 01, 2023:  3 and half weeks status post left total hip arthroplasty through the posterior lateral approach.  Patient had a fall about 4 days ago when she slipped on the hardwood floor wearing socks without grippers on the.  She reports that she is feeling much better and she is ambulating well today.  She is complaining more of left shoulder pain than her hip.  She has seen Dr. Mee in the past and they urged her to make a new appointment with him.  I will have home health work with the patient for the next 2 weeks as well as do a home assessment.  If she needs further physical therapy following those 2 weeks we can order outpatient physical therapy.  She does have a small abrasion in the gluteal cleft which is not concerning and seems to be healing well.  I will see her in 6 weeks and no x-rays will be needed unless there is any increasing pain.    January 18, 2023:  10 days s/p left THA, lateral approach. Pt is continuing to improve. Her pain fluctuates based off of her activity level. She was getting up and walking 8-10 steps every hour but this  started to increase her pain so they have cut it back to every 2 hours but continue to do exercise while she is seated. She had an allergic reaction to the oxycodone  that was given to her post op and she developed intense itching. She stopped the oxycodone  once this started and I prescribed her hydroxyzine  due to her allergy to benadryl. The itching has subsided since. She is currently taking tramadol  for her pain every 8 hours along with tylenol  and reports that this is helping with her pain. She is also taking the eliquis  prescribed to her due to her aspirin allergy. She denies calf pain and has no calf pain, swelling, and negative homan's on exam. Her incision is clean, dry, and intact without ttp or erythema. She states that most of her pain is around her incision. She continues to walk with a  walker for support. We will have her follow up with Dr. Marlyse on 4 weeks.     She also asked for lidocaine  patches today, which were prescribed, and I will refill her tramadol  when it is due this coming Friday.       December 08, 2022:  Primary osteoarthritis of the left hip. Pt is here for her H&P appointment prior to her 12/23/22 THA. 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 She has been cleared by her PCP for surgery and will be following up with her hematologist prior to her surgery for clearance due to her chronic iron deficiency anemia and low hemoglobin numbers on her last labs. They are out of the [place] network, so they will be faxing that over once completed. She receives iron infusions every other month and had her last one in November.     She has allergies to aspirin and diphenhydramine. Due to this she will get lovenox  30 mg as DVT prophylaxis. Due to her history of GI bleed, we will not prescribe meloxiam. I will order her post operative medications once she receives clearance from hematology.     September 25, 2022:  We will continue to plan on doing a left total hip arthroplasty through an anterolateral approach.  Again addressed the benefits and risks of anterolateral versus direct anterior the patient would like to continue the anterior lateral plan.  She does need to improve her health and get stabilized with recent GI bleed.  We will hope to move forward after the first of the year.    Apr 20, 2022:  She failed conservative treatment with Tylenol , ibuprofen and gabapentin .  She did get a left hip corticosteroid injection intra-articularly which gave her only a month of greater than 90% relief of her pain.  At this point would like to go forward with a left total hip replacement through the anterolateral approach.  I did discuss with her the risks and benefits of a direct  anterior versus anterolateral approach and she did agree to go forward with anterolateral approach.  She has an overhanging pannus that reaches down the thigh quite a bit.  She is currently working on adjusting her thyroid medications to get her hypothyroidism under control.  Her other medical comorbidities under medication control at this time.  She is currently working in childcare and would like to go forward with surgery in July as her last day of work is the end of June.      Past Medical History:  Diagnosis Date    Anemia     Asthma     Colon polyp     DDD (degenerative disc disease) 02/15/2009    Depression 12/18/2011    DVT (deep venous thrombosis) (HCC) 10/23/2009    Left leg:  No DVT.      Essential hypertension, benign     History of blood transfusion     History of echocardiogram 03/20/2004    EF 70%.  Borderline DDfx.  No significant valvular pathology.    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  Neg EKG on pharm stress test.    Thyroid disease     hypothyroidism       Family History   Problem Relation Age of Onset    Hypertension Sister         x3    Hypertension Mother     Cancer Mother     Heart Surgery Sister     Heart Disease Sister     Hypertension Paternal Uncle     Cancer Maternal Aunt         breast    Breast Cancer Maternal Aunt     Glaucoma Son     Heart Defect Son     Diabetes Paternal Uncle     Heart Attack Sister         Current Outpatient Medications   Medication Sig Dispense Refill    montelukast  (SINGULAIR ) 10 MG tablet TAKE 1 TABLET EVERY DAY 90 tablet 3    gabapentin  (NEURONTIN ) 300 MG capsule Take 1 capsule by mouth 3 times daily for 180 days. Max Daily Amount: 900 mg 270 capsule 1    atorvastatin  (LIPITOR) 40 MG tablet TAKE 1 TABLET EVERY DAY (NEED MD APPOINTMENT) 90 tablet 3    levothyroxine  (SYNTHROID ) 88 MCG tablet Take 1 tablet by mouth Daily 90 tablet 2    valsartan  (DIOVAN ) 320 MG tablet Take 1 tablet by mouth daily 90 tablet 2    amLODIPine  (NORVASC ) 5 MG tablet Take 1 tablet by mouth daily 90 tablet 2    moxifloxacin (VIGAMOX) 0.5 % ophthalmic solution instill 1 drop into right eye four times a day START 3 days prior to procedure      pantoprazole  (PROTONIX ) 40 MG tablet 1 tab(s) orally once a day      prednisoLONE acetate (PRED FORTE) 1 % ophthalmic suspension instill 1 drop into right eye 4 times daily starting 3 days prior to procedure      vitamin C (ASCORBIC ACID) 500 MG tablet Take 1 tablet by mouth daily      Multiple Vitamins-Minerals (MULTIVITAMIN ADULTS 50+ PO) Take 1 tablet by mouth daily      Misc Natural Products (NEURIVA PO) Take 1 tablet by mouth in the morning and at bedtime  NONFORMULARY Take 1 tablet by mouth every evening Viviscal -1 tablet every evening for hair and scalp      potassium chloride  (KLOR-CON  M) 10 MEQ extended release tablet TAKE 1 TABLET EVERY DAY 90 tablet 3    albuterol  sulfate HFA (PROVENTIL ;VENTOLIN ;PROAIR ) 108 (90 Base) MCG/ACT inhaler Inhale 2 puffs into the lungs every 4 hours as needed for Wheezing or Shortness of Breath 18 g 5    cyanocobalamin 1000 MCG tablet Take 1 tablet by mouth every evening      ZINC PO Take 1 tablet by mouth daily      latanoprost (XALATAN) 0.005 % ophthalmic solution Place 1 drop into both eyes nightly      lidocaine  (LIDODERM ) 5 % Place 1 patch onto the skin every 24 hours 12 hours on and 12 hours off      diclofenac  sodium (VOLTAREN ) 1 %  GEL Apply 4 g topically 4 times daily (Patient not taking: Reported on 10/12/2024) 350 g 5     No current facility-administered medications for this visit.        Allergies   Allergen Reactions    Aspirin Other (See Comments)     GI distress,     Diphenhydramine Other (See Comments)     Muscle jerking    Hydrocodone      Other Reaction(s): Unknown    Oxycodone      Pollen Extract      Other reaction(s): Not Reported This Time       Past Surgical History:   Procedure Laterality Date    BREAST BIOPSY      BREAST SURGERY      COLONOSCOPY N/A 04/25/2020    SCREENING COLONOSCOPY performed by Pattie Dale PARAS, MD at [facility] ENDOSCOPY    COLONOSCOPY  02-24-12    normal (Dr. Pattie)    COLONOSCOPY FLX DX W/COLLJ [facility] WHEN [facility]  02-2007    +polyp(tubular adenoma); Dr Pattie    HERNIA REPAIR      3x    HYSTERECTOMY (CERVIX STATUS UNKNOWN)      NEUROLOGICAL SURGERY  03-16-2009    s/p ACD & Fusion (Dr.P.Gurtner)    TOTAL HIP ARTHROPLASTY Left 01/08/2023    LEFT HIP TOTAL ARTHROPLASTY LATERAL APPROACH performed by Marlyse Morene ORN, DO at [facility] OR    TUBAL LIGATION      UPPER GASTROINTESTINAL ENDOSCOPY N/A 06/12/2022    ENTEROSCOPY PUSH DIAGNOSTIC w/small intestinal bipolar cautery of multiple AVMs performed by Dale PARAS Pattie, MD at [facility] ENDOSCOPY       Social History     Socioeconomic History    Marital status: Divorced     Spouse name: Not on file    Number of children: Not on file    Years of education: Not on file    Highest education level: Not on file   Occupational History    Not on file   Tobacco Use    Smoking status: Former     Current packs/day: 0.00     Average packs/day: 1 pack/day for 27.0 years (27.0 ttl pk-yrs)     Types: Cigarettes     Start date: 10/16/1977     Quit date: 04/25/2004     Years since quitting: 20.4     Passive exposure: Never    Smokeless tobacco: Never   Vaping Use    Vaping status: Never Used   Substance and Sexual Activity    Alcohol use: No    Drug use: Never  Sexual activity: Not Currently    Other Topics Concern    Not on file   Social History Narrative    Not on file     Social Drivers of Health     Financial Resource Strain: Low Risk  (01/04/2023)    Overall Financial Resource Strain (CARDIA)     Difficulty of Paying Living Expenses: Not hard at all   Food Insecurity: No Food Insecurity (02/15/2024)    Hunger Vital Sign     Worried About Running Out of Food in the Last Year: Never true     Ran Out of Food in the Last Year: Never true   Transportation Needs: No Transportation Needs (02/15/2024)    PRAPARE - Therapist, Art (Medical): No     Lack of Transportation (Non-Medical): No   Physical Activity: Insufficiently Active (02/15/2024)    Exercise Vital Sign     Days of Exercise per Week: 2 days     Minutes of Exercise per Session: 30 min   Stress: No Stress Concern Present (01/04/2023)    [employer] of Occupational Health - Occupational Stress Questionnaire     Feeling of Stress : Not at all   Social Connections: Feeling Socially Integrated (02/26/2023)    OASIS D0700: Social Isolation     Frequency of experiencing loneliness or isolation: Never   Intimate Partner Violence: Not At Risk (01/04/2023)    Humiliation, Afraid, Rape, and Kick questionnaire     Fear of Current or Ex-Partner: No     Emotionally Abused: No     Physically Abused: No     Sexually Abused: No   Housing Stability: Low Risk  (02/15/2024)    Housing Stability Vital Sign     Unable to Pay for Housing in the Last Year: No     Number of Times Moved in the Last Year: 0     Homeless in the Last Year: No       REVIEW OF SYSTEMS:      No changes from previous review of systems unless noted.      Prescription medication management discussed with patient.     Electronically signed by: Morene LELON Dills, DO    Note: This note was completed using voice recognition software.  Any typographical/name errors or mistakes are unintentional.     "

## 2024-10-25 DIAGNOSIS — Z5111 Encounter for antineoplastic chemotherapy: Secondary | ICD-10-CM | POA: Diagnosis not present

## 2024-10-25 DIAGNOSIS — Z5112 Encounter for antineoplastic immunotherapy: Secondary | ICD-10-CM | POA: Diagnosis not present

## 2024-10-25 DIAGNOSIS — C182 Malignant neoplasm of ascending colon: Secondary | ICD-10-CM | POA: Diagnosis not present

## 2024-10-26 ENCOUNTER — Encounter: Payer: Medicare (Managed Care) | Attending: Orthopaedic Surgery | Primary: Internal Medicine

## 2024-10-28 DIAGNOSIS — C182 Malignant neoplasm of ascending colon: Secondary | ICD-10-CM | POA: Diagnosis not present

## 2024-10-28 DIAGNOSIS — Z5112 Encounter for antineoplastic immunotherapy: Secondary | ICD-10-CM | POA: Diagnosis not present

## 2024-10-31 ENCOUNTER — Inpatient Hospital Stay: Admit: 2024-10-31 | Payer: Medicare (Managed Care) | Primary: Internal Medicine

## 2024-10-31 DIAGNOSIS — M1611 Unilateral primary osteoarthritis, right hip: Principal | ICD-10-CM

## 2024-10-31 NOTE — Progress Notes (Signed)
 "PHYSICAL / OCCUPATIONAL THERAPY - DAILY TREATMENT NOTE (updated 02/2024)  For Eval visit    Patient Name: Elaine [name]    Date: 10/31/2024    DOB: 1951/11/26  Insurance: Payor: [employer] MEDICARE / Plan: [employer] GOLD PLUS HMO / Product Type: *No Product type* /      Patient DOB verified yes     Visit #   Current / Total 1 16   Time   In / Out 4:15 pm 4:50 pm   Pain   In / Out 0 0   Subjective Functional Status/Changes: See POC     TREATMENT AREA =  see POC    OBJECTIVE      23 min   Eval - untimed                      Therapeutic Procedures:    Tx Min Billable or 1:1 Min (if diff from Tx Min) Procedure, Rationale, Specifics         7  97530 Therapeutic Activity (timed):  use of dynamic activities replicating functional movements to increase ROM, strength, coordination, balance, and proprioception in order to improve patient's ability to progress to PLOF and address remaining functional goals.  (see flow sheet as applicable)     Details if applicable:             5  97535 Self Care/Home Management (timed):  improve patient knowledge and understanding of home injury/symptom/pain management, positioning, posture/ergonomics, and activity modification  to improve patient's ability to progress to PLOF and address remaining functional goals.  (see flow sheet as applicable)     Details if applicable:            Details if applicable:            Details if applicable:     12  MC BC Totals Reminder: bill using total billable min of TIMED therapeutic procedures (example: do not include dry needle or estim unattended, both untimed codes, in totals to left)  8-22 min = 1 unit; 23-37 min = 2 units; 38-52 min = 3 units; 53-67 min = 4 units; 68-82 min = 5 units   Total Total     Charge Capture    [x]   Patient Education billed concurrently with other procedures   [x]  Review HEP    []  Progressed/Changed HEP, detail:    []  Other detail:       Objective Information/Functional Measures/Assessment    See POC    Patient will continue to  benefit from skilled PT / OT services to modify and progress therapeutic interventions, analyze and address functional mobility deficits, analyze and address ROM deficits, analyze and address strength deficits, analyze and address soft tissue restrictions, analyze and cue for proper movement patterns, analyze and modify for postural abnormalities, analyze and address imbalance/dizziness, and instruct in home and community integration to address functional deficits and attain remaining goals.    Progress toward goals / Updated goals:  [x]   See POC    Short Term Goals: To be accomplished in  3-4 weeks:  1. Independent with HEP.  EVAL: N/A  2. Decrease max pain to < 2/10 to assist with patient being able to stand for at least 30 mins to cook a meal or wash dishes without significant LBP or R hip pain.   EVAL: 10/10 max pain; 10-15 mins tolerance   3. Patient is no longer getting R leg radicular symptoms down to her toes.  EVAL: occasional radicular symptoms down lateral R leg to toes     Long Term Goals: To be accomplished in  6-8  weeks:  1.  Decrease max pain to <2/10 to assist with patient being able to get out of bed in the morning without debilitating R hip and leg pain.   EVAL: 10/10 max pain   2.  Improve LEFS Score by 18 points in order to show significant functional improvement.  EVAL: 42 points  3.  Improve L hip flexion and abduction strength to at least 4/5 MMT and L knee flexion and extension strength to at least 4+/5 MMT to allow patient better tolerance to walking community distances and exercising.  EVAL:  see assessment     Frequency / Duration:   Patient to be seen  1-2 times per week for 6-8  weeks:     Next PN 11/30/24  RC due 12/31/24  Auth due (visit number/ date) after EVAL    PLAN  yes Continue plan of care  []   Other:      Corean Freeman, PT    10/31/2024    6:36 PM  If an interpreting service was utilized for treatment of this patient, the contents of this document represent the material  reviewed with the patient via the interpreter.     Future Appointments   Date Time Provider Department Center   11/13/2024  3:40 PM Terrill Jonetta BIRCH, MD [facility] NE[facility] BS AMB   11/21/2024  4:00 PM Hunte, Marcus CROME, MD [facility] DEP         "

## 2024-10-31 NOTE — Therapy Evaluation (Signed)
 "[facility] [place] [facility] PHYSICAL THERAPY  [address], [place] [address]  [phone]  Plan of Care / Statement of Necessity for Physical Therapy Services     Patient Name: Elaine [name] DOB: 1951-11-02   Medical   Diagnosis: Primary osteoarthritis of right hip   Treatment Diagnosis: M25.551  RIGHT HIP PAIN  and M54.41  LUMBAGO WITH SCIATICA, RIGHT SIDE   Onset Date: 10/2024 Start of Care [facility]): 10/31/2024   Referral Source: Marlyse Morene ORN, DO Prior Hospitalization: See medical history   Prior Level of Function: Independent with ADLs and IADLs; retired   Comorbidities:  Respiratory disorders, Musculoskeletal disorders, and Other: asthma, HTN, thyroid dysfunction     Not post operative, standard auth procedure    Evaluation Complexity:  History:  MEDIUM  Complexity : 1-2 comorbidities / personal factors will impact the outcome/ POC ; Examination:  MEDIUM Complexity : 3 Standardized tests and measures addressin body structure, function, activity limitation and / or participation in recreation  ;Presentation:  MEDIUM Complexity : Evolving with changing characteristics  ;Clinical Decision Making: Lower Extremity Functional Scale (LEFS) = 42 ; (40-60 Mild to Moderate Dysfunction) = MODERATE Complexity  Overall Complexity Rating: MEDIUM  Problem List: pain affecting function, decrease ROM, decrease strength, impaired gait/balance, decrease ADL/functional abilities, decrease activity tolerance, decrease flexibility/joint mobility, and decrease transfer abilities    Treatment Plan may include any combination of the following: 02889 Therapeutic Exercise, 97112 Neuromuscular Re-Education, 97140 Manual Therapy, 97530 Therapeutic Activity, 97535 Self Care/Home Management, 97014 Electrical Stim unattended / [phone] [facility]), [phone] Electrical Stim attended, [phone] Gait Training, and (Elective Self Pay) Needle Insertion w/o Injection (1 or 2 muscles), (3+  muscles)  Patient / Family readiness to learn indicated by: asking questions, trying to perform skills, interest, and return verbalization   Persons(s) to be included in education: patient (P)  Barriers to Learning/Limitations: none  Measures taken if barriers to learning present: N/A  Patient Goal (s): I want to not wake up in severe pain.  Patient Self Reported Health Status: fair  Rehabilitation Potential: good    SUBJECTIVE  Pain Level (0-10 scale): worst - 10     best - 0    current - 0    [] constant [x] intermittent [] improving [] worsening [] no change since onset    Subjective functional status/changes:     Current symptoms/Complaints: Pt reports that about 3 weeks ago she just woke up with severe pain into her R hip one morning and since then she is having off and on R hip pain and pain traveling down her leg to her toes. She reports that her pain is the worse in the morning and then after she gets moving for a couple hours she usually feels ok. Patient c/o of chronic LBP also.   Limitations to PLOF: difficulty first getting up in the morning, prolonged standing and walking > 10 mins is painful  Mechanism of Injury: unknown  Previous Treatment/Compliance: previously compliant with PT  PMHx/Surgical Hx: L THA 01/2023  Work Hx: retired    OBJECTIVE/EXAMINATION    Gait:  []  Normal    []  Abnormal    [x]  Antalgic    []  NWB    Device:    Describe: limited R hip extension during gait; shortened stride length on R         ROM:  B/L hip AROM is WNL in all planes and does not reproduce pain  L/S ROM: flexion = FTT ankles (no pain), R and L SB (pain in R hip and low back)  extension = (no pain)    Strength (1-5)  Hip Left Right   Flexion 3+ 3+   Extension NT NT   Abduction 3+ 3+   Knee Left Right   Extension 4 4   Flexion 4 4        Flexibility:  Hamstrings:    (L) Tightness= []  WNL   [x]  Min   []  Mod   []  Severe    (R) Tightness= []  WNL   [x]  Min   []  Mod   []  Severe  Quadriceps:    (L) Tightness= []  WNL    []  Min   [x]  Mod   []  Severe    (R) Tightness= []  WNL   []  Min   [x]  Mod   []  Severe  Gastroc:    (L) Tightness= []  WNL   [x]  Min   []  Mod   []  Severe    (R) Tightness= []  WNL   [x]  Min   []  Mod   []  Severe                                  Palpation  [x]  Min  [x]  Mod  []  Severe    Location: R GT/ glute med tendon    Optional Tests  Patrick/FABER Test: []  Neg    [x]  Pos  Thomas Test:  []  Neg    [x]  Pos  Scouring Test:             []  Neg    [x]  Pos  Trendelenberg:  [x]  Neg    []  Pos []  Left    []  Right  OberTest:   []  Neg    [x]  Pos  Ely's Test:  []  Neg    [x]  Pos  Piriformis Test:  []  Neg    [x]  Pos    Other tests/ comments: supine to sit test = R leg longer then L     Balance tests:  romberg EO = 30 secs  EC = 10 secs   tandem stance = 5 secs        ASSESSMENT/Changes in Function: Pt presents with L/S radiculitis and R GT bursitis and would benefit from skilled PT to address ROM, flexibility, strength, and balance impairments to decrease pain and improve functional mobility.    Patient will continue to benefit from skilled PT services to modify and progress therapeutic interventions, analyze and address functional mobility deficits, analyze and address ROM deficits, analyze and address strength deficits, analyze and address soft tissue restrictions, analyze and cue for proper movement patterns, analyze and modify for postural abnormalities, analyze and address imbalance/dizziness, and instruct in home and community integration to address functional deficits and attain remaining goals.    PLAN  Short Term Goals: To be accomplished in  3-4 weeks:  1. Independent with HEP.  EVAL: N/A  2. Decrease max pain to < 2/10 to assist with patient being able to stand for at least 30 mins to cook a meal or wash dishes without significant LBP or R hip pain.   EVAL: 10/10 max pain; 10-15 mins tolerance   3. Patient is no longer getting R leg radicular symptoms down to her toes.   EVAL: occasional radicular symptoms down lateral R  leg to toes    Long Term Goals: To be accomplished in  6-8  weeks:  1.  Decrease max pain to <2/10 to assist with patient being able to get out of bed in the morning without debilitating R hip and leg pain.   EVAL: 10/10 max pain   2.  Improve LEFS Score by 18 points in order to show significant functional improvement.  EVAL: 42 points  3.  Improve L hip flexion and abduction strength to at least 4/5 MMT and L knee flexion and extension strength to at least 4+/5 MMT to allow patient better tolerance to walking community distances and exercising.  EVAL:  see assessment    Frequency / Duration:   Patient to be seen  1-2 times per week for 6-8  weeks:       Patient/ Caregiver education and instruction: Diagnosis, prognosis, self care, activity modification, and exercises [x]   Plan of care has been reviewed with PTA    Certification Period: 10/31/2024 - 12/31/24    Robertine Kipper, PT       10/31/2024       4:22 PM    Payor: [employer] / Plan: [employer] G[employer] PLUS HMO / Product Type: *No Product type* /     No Physician signature required; Signature on POC no longer required for Medicare as of 12/08/23   "

## 2024-11-01 DIAGNOSIS — C183 Malignant neoplasm of hepatic flexure: Secondary | ICD-10-CM | POA: Diagnosis not present

## 2024-11-01 DIAGNOSIS — J9601 Acute respiratory failure with hypoxia: Secondary | ICD-10-CM | POA: Diagnosis not present

## 2024-11-01 DIAGNOSIS — R634 Abnormal weight loss: Secondary | ICD-10-CM | POA: Diagnosis not present

## 2024-11-01 DIAGNOSIS — C787 Secondary malignant neoplasm of liver and intrahepatic bile duct: Secondary | ICD-10-CM | POA: Diagnosis not present

## 2024-11-01 DIAGNOSIS — I2699 Other pulmonary embolism without acute cor pulmonale: Secondary | ICD-10-CM | POA: Diagnosis not present

## 2024-11-01 DIAGNOSIS — D509 Iron deficiency anemia, unspecified: Secondary | ICD-10-CM | POA: Diagnosis not present

## 2024-11-01 NOTE — Telephone Encounter (Signed)
"  LM on 11/01/24 at 3:04pm to schedule f/u appts. due to her insurance authorization being approved for 16 visits expiring on 12/29/24. 1-2 times a week for 6-8 weeks.   "

## 2024-11-09 ENCOUNTER — Inpatient Hospital Stay: Admit: 2024-11-09 | Payer: Medicare (Managed Care) | Primary: Internal Medicine

## 2024-11-09 DIAGNOSIS — M1611 Unilateral primary osteoarthritis, right hip: Principal | ICD-10-CM

## 2024-11-09 NOTE — Progress Notes (Signed)
 "PHYSICAL / OCCUPATIONAL THERAPY - DAILY TREATMENT NOTE    Patient Name: Elaine [name]    Date: 11/09/2024    DOB: 1951-04-08  Insurance: Payor: [employer] MEDICARE / Plan: [employer] G[employer] PLUS HMO / Product Type: *No Product type* /      Patient DOB verified Yes     Visit #   Current / Total 2 16   Time   In / Out 4:10 4:58   Pain   In / Out 5 0   Subjective Functional Status/Changes: My back always hurts.     TREATMENT AREA =  Primary osteoarthritis of right hip  Pain in right hip  Lumbago with sciatica, right side    OBJECTIVE         Therapeutic Procedures:    Tx Min Billable or 1:1 Min (if diff from Tx Min) Procedure, Rationale, Specifics   23  97110 Therapeutic Exercise (timed):  increase ROM, strength, coordination, balance, and proprioception to improve patient's ability to progress to PLOF and address remaining functional goals. (see flow sheet as applicable)     Details if applicable:       10  97530 Therapeutic Activity (timed):  use of dynamic activities replicating functional movements to increase ROM, strength, coordination, balance, and proprioception in order to improve patient's ability to progress to PLOF and address remaining functional goals.  (see flow sheet as applicable)     Details if applicable:     15  97140 Manual Therapy (timed):  Use of PROM and soft tissue massage to decrease pain, increase ROM, and increase tissue extensibility to improve patient's ability to progress to PLOF and address remaining functional goals.  The manual therapy interventions were performed at a separate and distinct time from the therapeutic activities interventions . (see flow sheet as applicable)     Details if applicable:            Details if applicable:            Details if applicable:     48  MC BC Totals Reminder: bill using total billable min of TIMED therapeutic procedures (example: do not include dry needle or estim unattended, both untimed codes, in totals to left)  8-22 min = 1 unit; 23-37 min = 2 units;  38-52 min = 3 units; 53-67 min = 4 units; 68-82 min = 5 units   Total Total     Charge Capture    [x]   Patient Education billed concurrently with other procedures   [x]  Review HEP    []  Progressed/Changed HEP, detail:    []  Other detail:       Objective Information/Functional Measures/Assessment  Pt responded well to each there ex with cues for correct form.  Pt presents with left innominate posterior rotated and left piriformis tightness. Following manual pain was gone at LB.       Patient will continue to benefit from skilled PT / OT services to modify and progress therapeutic interventions, analyze and address functional mobility deficits, analyze and address ROM deficits, analyze and address strength deficits, analyze and address soft tissue restrictions, analyze and cue for proper movement patterns, analyze and modify for postural abnormalities, analyze and address imbalance/dizziness, and instruct in home and community integration to address functional deficits and attain remaining goals.    Progress toward goals / Updated goals:  []   See Progress Note/Recertification  1. Independent with HEP.  EVAL: N/A  Current:  1x day   11/09/24  2. Decrease  max pain to < 2/10 to assist with patient being able to stand for at least 30 mins to cook a meal or wash dishes without significant LBP or R hip pain.   EVAL: 10/10 max pain; 10-15 mins tolerance   3. Patient is no longer getting R leg radicular symptoms down to her toes.   EVAL: occasional radicular symptoms down lateral R leg to toes     Long Term Goals: To be accomplished in  6-8  weeks:  1.  Decrease max pain to <2/10 to assist with patient being able to get out of bed in the morning without debilitating R hip and leg pain.   EVAL: 10/10 max pain   2.  Improve LEFS Score by 18 points in order to show significant functional improvement.  EVAL: 42 points  3.  Improve L hip flexion and abduction strength to at least 4/5 MMT and L knee flexion and extension strength to  at least 4+/5 MMT to allow patient better tolerance to walking community distances and exercising.  EVAL:  see assessment       Next PN 11/30/24  RC due 12/31/24  Auth due (visit number/ date) after EVAL         PLAN  - Continue Plan of Care    Latanza Pfefferkorn  [place], PTA    11/09/2024    4:21 PM  If an interpreting service was utilized for treatment of this patient, the contents of this document represent the material reviewed with the patient via the interpreter.     Future Appointments   Date Time Provider Department Center   11/21/2024  4:00 PM Waldon Marcus CROME, MD [facility] DEP   12/04/2024  3:20 PM Terrill Jonetta BIRCH, MD [facility] NE[facility] BS AMB        "

## 2024-11-10 ENCOUNTER — Inpatient Hospital Stay: Admit: 2024-11-10 | Payer: Medicare (Managed Care) | Primary: Internal Medicine

## 2024-11-10 DIAGNOSIS — D500 Iron deficiency anemia secondary to blood loss (chronic): Principal | ICD-10-CM

## 2024-11-10 DIAGNOSIS — R7303 Prediabetes: Secondary | ICD-10-CM

## 2024-11-10 LAB — COMPREHENSIVE METABOLIC PANEL
ALT: 14 U/L (ref 10–35)
AST: 16 U/L (ref 10–38)
Albumin/Globulin Ratio: 1.3 (ref 0.8–1.7)
Albumin: 3.7 g/dL (ref 3.4–5.0)
Alk Phosphatase: 87 U/L (ref 45–117)
Anion Gap: 9 mmol/L (ref 3.0–18.0)
BUN/Creatinine Ratio: 14 (ref 12–20)
BUN: 10 mg/dL (ref 6–23)
CO2: 32 mmol/L (ref 21–32)
Calcium: 9.9 mg/dL (ref 8.6–10.2)
Chloride: 100 mmol/L (ref 98–107)
Creatinine: 0.69 mg/dL (ref 0.6–1.3)
Est, Glom Filt Rate: >90 ml/min/1.73m2 (ref >60)
Globulin: 2.8 g/dL (ref 2.0–4.0)
Glucose: 86 mg/dL (ref 74–108)
Potassium: 4.6 mmol/L (ref 3.5–5.5)
Sodium: 141 mmol/L (ref 136–145)
Total Bilirubin: 0.5 mg/dL (ref 0.2–1.0)
Total Protein: 6.6 g/dL (ref 6.4–8.2)

## 2024-11-10 LAB — CBC WITH AUTO DIFFERENTIAL
Basophils %: 0.5 % (ref 0.0–2.0)
Basophils Absolute: 0.03 K/UL (ref 0.00–0.10)
Eosinophils %: 1.5 % (ref 0.0–5.0)
Eosinophils Absolute: 0.09 K/UL (ref 0.00–0.40)
Hematocrit: 35.9 % (ref 35.0–45.0)
Hemoglobin: 11 g/dL — ABNORMAL LOW (ref 12.0–16.0)
Immature Granulocytes %: 0.2 % (ref 0.0–0.5)
Immature Granulocytes Absolute: 0.01 K/UL (ref 0.00–0.04)
Lymphocytes %: 12 % — ABNORMAL LOW (ref 21.0–52.0)
Lymphocytes Absolute: 0.73 K/UL — ABNORMAL LOW (ref 0.90–3.60)
MCH: 25.9 pg (ref 24.0–34.0)
MCHC: 30.6 g/dL — ABNORMAL LOW (ref 31.0–37.0)
MCV: 84.5 FL (ref 78.0–100.0)
MPV: 11.5 FL (ref 9.2–11.8)
Monocytes %: 6.8 % (ref 3.0–10.0)
Monocytes Absolute: 0.41 K/UL (ref 0.05–1.20)
Neutrophils %: 79 % — ABNORMAL HIGH (ref 40.0–73.0)
Neutrophils Absolute: 4.8 K/UL (ref 1.80–8.00)
Nucleated RBCs: 0 /100{WBCs}
Platelets: 302 K/uL (ref 135–420)
RBC: 4.25 M/uL (ref 4.20–5.30)
RDW: 17.1 % — ABNORMAL HIGH (ref 11.6–14.5)
WBC: 6.1 K/uL (ref 4.6–13.2)
nRBC: 0 K/uL (ref 0.00–0.01)

## 2024-11-10 LAB — VITAMIN D 25 HYDROXY: Vit D, 25-Hydroxy: 50.2 ng/mL (ref 30.0–100.0)

## 2024-11-10 LAB — TSH + FREE T4 PANEL
T4 Free: 0.8 ng/dL — ABNORMAL LOW (ref 0.9–1.7)
TSH, 3rd Generation: 15.7 u[IU]/mL — ABNORMAL HIGH (ref 0.27–4.20)

## 2024-11-10 LAB — LIPID PANEL
Chol/HDL Ratio: 2.5 (ref 0–5.0)
Cholesterol, Total: 171 mg/dL
HDL: 69 mg/dL — ABNORMAL HIGH (ref 40–60)
LDL Cholesterol: 89 mg/dL (ref 0–100)
Triglycerides: 64 mg/dL (ref 0–150)
VLDL Cholesterol Calculated: 13 mg/dL

## 2024-11-13 ENCOUNTER — Encounter
Payer: Medicare (Managed Care) | Attending: Student in an Organized Health Care Education/Training Program | Primary: Internal Medicine

## 2024-11-14 ENCOUNTER — Encounter: Payer: Medicare (Managed Care) | Primary: Internal Medicine

## 2024-11-16 DIAGNOSIS — Z86718 Personal history of other venous thrombosis and embolism: Secondary | ICD-10-CM | POA: Diagnosis not present

## 2024-11-16 DIAGNOSIS — K219 Gastro-esophageal reflux disease without esophagitis: Secondary | ICD-10-CM | POA: Diagnosis not present

## 2024-11-16 DIAGNOSIS — R1084 Generalized abdominal pain: Secondary | ICD-10-CM | POA: Diagnosis not present

## 2024-11-16 DIAGNOSIS — C189 Malignant neoplasm of colon, unspecified: Secondary | ICD-10-CM | POA: Diagnosis not present

## 2024-11-16 DIAGNOSIS — E785 Hyperlipidemia, unspecified: Secondary | ICD-10-CM | POA: Diagnosis not present

## 2024-11-16 DIAGNOSIS — R7989 Other specified abnormal findings of blood chemistry: Secondary | ICD-10-CM | POA: Diagnosis not present

## 2024-11-16 DIAGNOSIS — Z86711 Personal history of pulmonary embolism: Secondary | ICD-10-CM | POA: Diagnosis not present

## 2024-11-16 DIAGNOSIS — R933 Abnormal findings on diagnostic imaging of other parts of digestive tract: Secondary | ICD-10-CM | POA: Diagnosis not present

## 2024-11-16 DIAGNOSIS — C183 Malignant neoplasm of hepatic flexure: Secondary | ICD-10-CM | POA: Diagnosis not present

## 2024-11-16 DIAGNOSIS — C182 Malignant neoplasm of ascending colon: Secondary | ICD-10-CM | POA: Diagnosis not present

## 2024-11-16 DIAGNOSIS — D72825 Bandemia: Secondary | ICD-10-CM | POA: Diagnosis not present

## 2024-11-16 DIAGNOSIS — D62 Acute posthemorrhagic anemia: Secondary | ICD-10-CM | POA: Diagnosis not present

## 2024-11-16 DIAGNOSIS — D509 Iron deficiency anemia, unspecified: Secondary | ICD-10-CM | POA: Diagnosis not present

## 2024-11-16 DIAGNOSIS — C787 Secondary malignant neoplasm of liver and intrahepatic bile duct: Secondary | ICD-10-CM | POA: Diagnosis not present

## 2024-11-16 DIAGNOSIS — K5901 Slow transit constipation: Secondary | ICD-10-CM | POA: Diagnosis not present

## 2024-11-16 DIAGNOSIS — D649 Anemia, unspecified: Secondary | ICD-10-CM | POA: Diagnosis not present

## 2024-11-16 DIAGNOSIS — K5900 Constipation, unspecified: Secondary | ICD-10-CM | POA: Diagnosis not present

## 2024-11-16 DIAGNOSIS — R9341 Abnormal radiologic findings on diagnostic imaging of renal pelvis, ureter, or bladder: Secondary | ICD-10-CM | POA: Diagnosis not present

## 2024-11-16 DIAGNOSIS — N179 Acute kidney failure, unspecified: Secondary | ICD-10-CM | POA: Diagnosis not present

## 2024-11-17 DIAGNOSIS — C189 Malignant neoplasm of colon, unspecified: Secondary | ICD-10-CM | POA: Diagnosis not present

## 2024-11-17 DIAGNOSIS — K5900 Constipation, unspecified: Secondary | ICD-10-CM | POA: Diagnosis not present

## 2024-11-17 DIAGNOSIS — D72829 Elevated white blood cell count, unspecified: Secondary | ICD-10-CM | POA: Diagnosis not present

## 2024-11-17 DIAGNOSIS — N179 Acute kidney failure, unspecified: Secondary | ICD-10-CM | POA: Diagnosis not present

## 2024-11-17 DIAGNOSIS — N1832 Chronic kidney disease, stage 3b: Secondary | ICD-10-CM | POA: Diagnosis not present

## 2024-11-20 ENCOUNTER — Encounter: Payer: Medicare (Managed Care) | Primary: Internal Medicine

## 2024-11-21 ENCOUNTER — Ambulatory Visit
Admit: 2024-11-21 | Discharge: 2024-11-21 | Payer: Medicare (Managed Care) | Attending: Internal Medicine | Primary: Internal Medicine

## 2024-11-21 VITALS — BP 157/82 | HR 98 | Temp 98.00000°F | Resp 20 | Ht 62.0 in | Wt 156.0 lb

## 2024-11-21 DIAGNOSIS — I10 Essential (primary) hypertension: Principal | ICD-10-CM

## 2024-11-21 NOTE — Progress Notes (Signed)
 "Elaine [name] is a 73 y.o.Elaine [name]  female and presents with Hypertension, Cholesterol Problem, Thyroid Problem (/), and Vitamin D Deficiency      SUBJECTIVE:  [name] HTN is uncontrolled on   Norvasc  5  mg, Diovan  320 mg. She was on aldactone 25 mg in the past .  [name] high cholesterol  is well controlled on Lipitor 40 mg. Pt  denies any side effects like myalgias.  Pt has been controlling her prediabetes with diet and weight loss.  Patient daughter has been helping her to be more compliant with taking her medications.  .     Thyroid Review:  Patient is seen for followup of hypothyroidism.   Thyroid ROS: denies fatigue, weight changes, heat/cold intolerance, bowel/skin changes or CVS symptoms. 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  Patient's TSH level is high on Synthroid  88 mcg daily due to poor compliance..  Patient daughter will try and help her to take the medication consistently    Patient has history of vitamin D deficiency  that is controlled on just MV currently.   Patient has sleep apnea and is using a CPAP machine and followed by sleep specialist.    Patient has asthma that is controlled on Singulair  and  trelegy Ellipta.  Patient is followed by pulmonary who also sees her for sleep apnea.      Patient has chronic diastolic heart failure which is stable and followed by cardiology    Patient has an atrophic right kidney which may have been going on for several years if not since birth.  Duplex renal ultrasound shows Eleveated velocity identified in the mid left renal artery in the presence of tortousity. No hemodynamically significant stenosis involving the right renal artery.  Patient is followed by nephrology.  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  She has had  EGD/colonoscopy and pill endoscopy in the past.  Patient unable to use aspirin due to this reason. Last colonoscopy was 2021 with 7 year f/u 2027    Patient has a history of Chiari malformation type I and has been referred to neurology and had MRI of brain.Elaine [name]        Respiratory ROS: negative for - shortness of breath  Cardiovascular ROS: negative for - chest pain      Current Outpatient Medications   Medication Sig    montelukast  (SINGULAIR ) 10 MG tablet TAKE 1 TABLET EVERY DAY    gabapentin  (NEURONTIN ) 300 MG capsule Take 1 capsule by mouth 3 times daily for 180 days. Max Daily Amount: 900 mg    atorvastatin  (LIPITOR) 40 MG tablet TAKE 1 TABLET EVERY DAY (NEED MD APPOINTMENT)    levothyroxine  (SYNTHROID ) 88 MCG tablet Take 1 tablet by mouth Daily    valsartan  (DIOVAN ) 320 MG tablet Take 1 tablet by mouth daily    amLODIPine  (NORVASC ) 5 MG tablet Take 1 tablet by mouth daily    moxifloxacin (VIGAMOX) 0.5 % ophthalmic solution instill 1 drop into right eye four times a day START 3 days prior to procedure    pantoprazole  (PROTONIX ) 40 MG tablet 1 tab(s) orally once a day    prednisoLONE acetate (PRED FORTE) 1 % ophthalmic suspension instill 1 drop into right eye 4 times daily starting 3 days  prior to procedure    vitamin C (ASCORBIC ACID) 500 MG tablet Take 1 tablet by mouth daily    Multiple Vitamins-Minerals (MULTIVITAMIN ADULTS 50+ PO) Take 1 tablet by mouth daily    Misc Natural Products (NEURIVA PO) Take 1 tablet by mouth in the morning and at bedtime    NONFORMULARY Take 1 tablet by mouth every evening Viviscal -1 tablet every evening for hair and scalp    potassium chloride  (KLOR-CON  M) 10 MEQ extended release tablet TAKE 1 TABLET EVERY DAY    albuterol  sulfate HFA (PROVENTIL ;VENTOLIN ;PROAIR ) 108 (90 Base) MCG/ACT inhaler Inhale 2 puffs into the lungs every 4 hours as needed for Wheezing or Shortness of Breath    cyanocobalamin 1000 MCG tablet Take 1 tablet by mouth every evening    ZINC PO Take 1 tablet by  mouth daily    latanoprost (XALATAN) 0.005 % ophthalmic solution Place 1 drop into both eyes nightly    lidocaine  (LIDODERM ) 5 % Place 1 patch onto the skin every 24 hours 12 hours on and 12 hours off    diclofenac  sodium (VOLTAREN ) 1 % GEL Apply 4 g topically 4 times daily (Patient not taking: Reported on 10/12/2024)     No current facility-administered medications for this visit.           OBJECTIVE:  alert, well appearing, and in no distress    Visit Vitals  BP (!) 157/82 (BP Site: Right Upper Arm, Patient Position: Sitting, BP Cuff Size: Large Adult)   Pulse 98   Temp 98 F (36.7 C) (Oral)   Resp 20   Ht 1.575 m (5' 2)   Wt 70.8 kg (156 lb)   SpO2 97%   BMI 28.53 kg/m          well developed and well nourished  Chest - clear to auscultation, no wheezes, rales or rhonchi, symmetric air entry  Heart - normal rate, regular rhythm, normal S1, S2, no murmurs, rubs, clicks or gallops  Extremities - peripheral pulses normal, no pedal edema, no clubbing or cyanosis    CMP:    Lab Results   Component Value Date/Time    NA 141 11/10/2024 06:53 AM    K 4.6 11/10/2024 06:53 AM    CL 100 11/10/2024 06:53 AM    CO2 32 11/10/2024 06:53 AM    BUN 10 11/10/2024 06:53 AM    CREATININE 0.69 11/10/2024 06:53 AM    GFRAA >60 05/26/2021 08:05 AM    AGRATIO 1.7 03/09/2023 12:00 AM    LABGLOM >90 11/10/2024 06:53 AM    LABGLOM 96 03/09/2023 12:00 AM    GLUCOSE 86 11/10/2024 06:53 AM    GLUCOSE 97 09/24/2023 12:00 AM    LABALBU 4.1 03/09/2023 12:00 AM    CALCIUM  9.9 11/10/2024 06:53 AM    BILITOT 0.5 11/10/2024 06:53 AM    ALKPHOS 87 11/10/2024 06:53 AM    ALKPHOS 87 09/24/2023 12:00 AM    AST 16 11/10/2024 06:53 AM    ALT 14 11/10/2024 06:53 AM     HgBA1c:    Lab Results   Component Value Date/Time    LABA1C 5.4 08/15/2024 04:04 PM     FLP:    Lab Results   Component Value Date/Time    TRIG 64 11/10/2024 06:53 AM    HDL 69 11/10/2024 06:53 AM     TSH:    Lab Results   Component Value Date/Time    TSH 15.700 11/10/2024 06:53 AM  Discussed the patient's BMI with her.  The BMI follow up plan is as follows: I have counseled this patient on diet and exercise regimens.        Assessment/Plan    ICD-10-CM    1. Essential hypertension  I10 Uncontrolled Diovan  320 mg daily and Norvasc  5 mg daily.  Patient to monitor her blood pressure and will adjust medications if needed at next office visit Comprehensive Metabolic Panel      2. High cholesterol  E78.00 Well-controlled Lipitor 40 mg daily lipid Panel      3. Iron deficiency anemia due to chronic blood loss  D50.0 Currently stable.  Patient followed by hematology for iron infusions as needed CBC with Auto Differential      4. Vitamin D deficiency  E55.9 Well-controlled current supplementation Vitamin D 25 Hydroxy      5. 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  Patient daughter  will help her to try and be more compliant TSH + Free T4 Panel           Follow-up and Dispositions    Ms.Return in about 3 months (around 02/19/2025) for labs 1 week before.             Reviewed plan of care. Patient has provided input and agrees with goals.  "

## 2024-11-21 NOTE — Progress Notes (Signed)
"  Elaine [name] presents today for   Chief Complaint   Patient presents with    Anemia    Hypertension    Thyroid Problem     3 month follow up            Have you been to the ER, urgent care clinic since your last visit?  Hospitalized since your last visit?    NO    Have you seen or consulted any other health care providers outside of [facility] since your last visit?    NO             "

## 2024-12-04 ENCOUNTER — Ambulatory Visit
Admit: 2024-12-04 | Discharge: 2024-12-04 | Payer: Medicare (Managed Care) | Attending: Student in an Organized Health Care Education/Training Program | Primary: Internal Medicine

## 2024-12-04 DIAGNOSIS — G935 Compression of brain: Principal | ICD-10-CM

## 2024-12-04 NOTE — Progress Notes (Unsigned)
 "Elaine [name] is a 73 y.o. female .presents for No chief complaint on file.   .    A 73 years old female patient referred here for evaluation of past diagnosis of Chiari malformation.  She had cervical fusion surgery in 2010 [ACDF].  Was diagnosed with Chiari malformation at the time.  She was stumbling when walking.  Feels unsteady/off-balance.   She mentioned that my legs lead me; I do not lead them.  Might fall.  No weakness.  No incontinence.  She is following with urology for urgency and frequency.  No history of head trauma.  She mentioned headaches; tends to increase with asthma.  It is global.  No occipital headache/pain.  No worsening headache with straining/coughing.  No passing out spells previously.  She mentioned occasional neck pain; mild stiffness.  Has hip replacement surgery in February 2025 on the left side.  Has difficulty with shoulder movement on the left.  Has occasional weakness of the left hand.  No numbness.  No problem with her speech.  No problems swallowing.  MRI of the brain from October 2023: Microvascular ischemic changes in the white matter; The cerebellar tonsils extend approximately 8 mm inferior to the level of the foramen magnum compatible with a Chiari I malformation.        Review of Systems   Constitutional:  Negative for chills, fever and unexpected weight change.   HENT:  Negative for hearing loss and tinnitus.    Eyes:  Positive for visual disturbance (has glasses; has cataract surgery on the right; will get for the left in March).   Respiratory:  Positive for cough (sometimes) and shortness of breath (when getting cold outside).    Cardiovascular:  Negative for chest pain and leg swelling.   Gastrointestinal:  Negative for nausea and vomiting.   Genitourinary:  Positive for frequency and urgency. Negative for dysuria.   Musculoskeletal:  Positive for back pain and neck pain (mild stiffness).   Skin:  Negative for rash.   Neurological:  Positive for weakness (sometimes)  and headaches (sometimes). Negative for dizziness, tremors, seizures, syncope, facial asymmetry, speech difficulty and numbness.         Past Medical History:   Diagnosis Date    Anemia     Asthma     Colon polyp     DDD (degenerative disc disease) 02/15/2009    Depression 12/18/2011    DVT (deep venous thrombosis) (HCC) 10/23/2009    Left leg:  No DVT.      Essential hypertension, benign     History of blood transfusion     History of echocardiogram 03/20/2004    EF 70%.  Borderline DDfx.  No significant valvular pathology.    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  Neg EKG on pharm stress test.  Thyroid disease     hypothyroidism        Past Surgical History:   Procedure Laterality Date    BREAST BIOPSY      BREAST SURGERY      COLONOSCOPY N/A 04/25/2020    SCREENING COLONOSCOPY performed by Pattie Dale PARAS, MD at [facility] ENDOSCOPY    COLONOSCOPY  02-24-12    normal (Dr. Pattie)    COLONOSCOPY FLX DX W/COLLJ [facility] WHEN P[facility]MD  02-2007    +polyp(tubular adenoma); Dr Pattie    HERNIA REPAIR      3x    HYSTERECTOMY (CERVIX STATUS UNKNOWN)      NEUROLOGICAL SURGERY  03-16-2009    s/p ACD & Fusion (Dr.P.Gurtner)    TOTAL HIP ARTHROPLASTY Left 01/08/2023    LEFT HIP TOTAL ARTHROPLASTY LATERAL APPROACH performed by Marlyse Morene ORN, DO at [facility] MAIN OR    TUBAL LIGATION      UPPER GASTROINTESTINAL ENDOSCOPY N/A 06/12/2022    ENTEROSCOPY PUSH DIAGNOSTIC  w/small intestinal bipolar cautery of multiple AVMs performed by Dale PARAS Pattie, MD at [facility] ENDOSCOPY        Family History   Problem Relation Age of Onset    Hypertension Sister         x3    Hypertension Mother     Cancer Mother     Heart Surgery Sister     Heart Disease Sister     Hypertension Paternal Uncle     Cancer Maternal Aunt         breast    Breast Cancer Maternal Aunt     Glaucoma Son     Heart Defect Son     Diabetes Paternal Uncle     Heart Attack Sister         Social History     Socioeconomic History    Marital status: Divorced     Spouse name: Not on file    Number of children: Not on file    Years of education: Not on file    Highest education level: Not on file   Occupational History    Not on file   Tobacco Use    Smoking status: Former     Current packs/day: 0.00     Average packs/day: 1 pack/day for 27.0 years (27.0 ttl pk-yrs)     Types: Cigarettes     Start date: 10/16/1977     Quit date: 04/25/2004     Years since quitting: 20.6     Passive exposure: Never    Smokeless tobacco: Never   Vaping Use    Vaping status: Never Used   Substance and Sexual Activity    Alcohol use: No    Drug use: Never    Sexual activity: Not Currently   Other Topics Concern    Not on file   Social History Narrative    Not on file     Social Drivers of Health     Financial Resource Strain: Low Risk  (01/04/2023)    Overall Financial Resource Strain (CARDIA)     Difficulty of Paying Living Expenses: Not hard at all   Food Insecurity: No Food Insecurity (02/15/2024)    Hunger Vital Sign     Worried About Running Out of Food in the Last Year: Never true     Ran Out of Food in the Last Year: Never true   Transportation Needs: No Transportation Needs (02/15/2024)    PRAPARE - Transportation     Lack  of Transportation (Medical): No     Lack of Transportation (Non-Medical): No   Physical Activity: Insufficiently Active (02/15/2024)    Exercise Vital Sign     Days of Exercise per Week: 2 days     Minutes of Exercise per Session:  30 min   Stress: No Stress Concern Present (01/04/2023)    [employer] of [employer] - Occupational Stress Questionnaire     Feeling of Stress : Not at all   Social Connections: Feeling Socially Integrated (02/26/2023)    OASIS D0700: Social Isolation     Frequency of experiencing loneliness or isolation: Never   Intimate Partner Violence: Not At Risk (01/04/2023)    Humiliation, Afraid, Rape, and Kick questionnaire     Fear of Current or Ex-Partner: No     Emotionally Abused: No     Physically Abused: No     Sexually Abused: No   Housing Stability: Low Risk  (02/15/2024)    Housing Stability Vital Sign     Unable to Pay for Housing in the Last Year: No     Number of Times Moved in the Last Year: 0     Homeless in the Last Year: No        Allergies   Allergen Reactions    Aspirin Other (See Comments)     GI distress,     Diphenhydramine Other (See Comments)     Muscle jerking    Hydrocodone      Other Reaction(s): Unknown    Oxycodone      Pollen Extract      Other reaction(s): Not Reported This Time         Current Outpatient Medications   Medication Sig Dispense Refill    montelukast  (SINGULAIR ) 10 MG tablet TAKE 1 TABLET EVERY DAY 90 tablet 3    gabapentin  (NEURONTIN ) 300 MG capsule Take 1 capsule by mouth 3 times daily for 180 days. Max Daily Amount: 900 mg 270 capsule 1    atorvastatin  (LIPITOR) 40 MG tablet TAKE 1 TABLET EVERY DAY (NEED MD APPOINTMENT) 90 tablet 3    levothyroxine  (SYNTHROID ) 88 MCG tablet Take 1 tablet by mouth Daily 90 tablet 2    valsartan  (DIOVAN ) 320 MG tablet Take 1 tablet by mouth daily 90 tablet 2    amLODIPine  (NORVASC ) 5 MG tablet Take 1 tablet by mouth daily 90 tablet 2    moxifloxacin (VIGAMOX) 0.5 % ophthalmic solution instill 1 drop into right eye four times a day START 3 days prior to procedure      pantoprazole  (PROTONIX ) 40 MG tablet 1 tab(s) orally once a day      prednisoLONE acetate (PRED FORTE) 1 % ophthalmic suspension instill 1 drop into right eye 4 times daily  starting 3 days prior to procedure      diclofenac  sodium (VOLTAREN ) 1 % GEL Apply 4 g topically 4 times daily (Patient not taking: Reported on 10/12/2024) 350 g 5    vitamin C (ASCORBIC ACID) 500 MG tablet Take 1 tablet by mouth daily      Multiple Vitamins-Minerals (MULTIVITAMIN ADULTS 50+ PO) Take 1 tablet by mouth daily      Misc Natural Products (NEURIVA PO) Take 1 tablet by mouth in the morning and at bedtime      NONFORMULARY Take 1 tablet by mouth every evening Viviscal -1 tablet every evening for hair and scalp      potassium chloride  (KLOR-CON  M) 10 MEQ extended release tablet TAKE 1 TABLET EVERY  DAY 90 tablet 3    albuterol  sulfate HFA (PROVENTIL ;VENTOLIN ;PROAIR ) 108 (90 Base) MCG/ACT inhaler Inhale 2 puffs into the lungs every 4 hours as needed for Wheezing or Shortness of Breath 18 g 5    cyanocobalamin 1000 MCG tablet Take 1 tablet by mouth every evening      ZINC PO Take 1 tablet by mouth daily      latanoprost (XALATAN) 0.005 % ophthalmic solution Place 1 drop into both eyes nightly      lidocaine  (LIDODERM ) 5 % Place 1 patch onto the skin every 24 hours 12 hours on and 12 hours off       No current facility-administered medications for this visit.       Physical Exam  Constitutional:       Appearance: Normal appearance.   HENT:      Head: Normocephalic and atraumatic.      Mouth/Throat:      Mouth: Mucous membranes are moist.      Pharynx: Oropharynx is clear. No oropharyngeal exudate.   Eyes:      Extraocular Movements: Extraocular movements intact.      Pupils: Pupils are equal, round, and reactive to light.   Pulmonary:      Effort: Pulmonary effort is normal.      Breath sounds: Normal breath sounds.   Musculoskeletal:         General: Normal range of motion.      Cervical back: Normal range of motion and neck supple.      Right lower leg: No edema.      Left lower leg: No edema.   Neurological:      Mental Status: She is alert.      Comments: Mental status: Awake, alert, oriented x3, follows  simple and complex commands, no neglect, no extinction to DSS or VSS, immediate recall 3/3 and delayed recall 3/3.  Speech and languge: fluent, coherent,  and comprehension intact  CN: VFF, EOMI, PERRLA, face sensation intact , no facial asymmetry noted, palate elevation symmetric bilat, SS+SCM 5/5 bilat, tongue midline  Motor: no pronator drift, tone normal throughout, strength 5/5 throughout  Sensory: intact to light touch throughout  Coordination: FNF  accurate w/o dysmetria  DTR: 2+ throughout  Gait:  antalgic otherwise normal.            Hospital Outpatient Visit on 11/10/2024   Component Date Value Ref Range Status    WBC 11/10/2024 6.1  4.6 - 13.2 K/uL Final    RBC 11/10/2024 4.25  4.20 - 5.30 M/uL Final    Hemoglobin 11/10/2024 11.0 (L)  12.0 - 16.0 g/dL Final    Hematocrit 87/94/7974 35.9  35.0 - 45.0 % Final    MCV 11/10/2024 84.5  78.0 - 100.0 FL Final    MCH 11/10/2024 25.9  24.0 - 34.0 PG Final    MCHC 11/10/2024 30.6 (L)  31.0 - 37.0 g/dL Final    RDW 87/94/7974 17.1 (H)  11.6 - 14.5 % Final    Platelets 11/10/2024 302  135 - 420 K/uL Final    MPV 11/10/2024 11.5  9.2 - 11.8 FL Final    Nucleated RBCs 11/10/2024 0.0  0 PER 100 WBC Final    nRBC 11/10/2024 0.00  0.00 - 0.01 K/uL Final    Neutrophils % 11/10/2024 79.0 (H)  40.0 - 73.0 % Final    Lymphocytes % 11/10/2024 12.0 (L)  21.0 - 52.0 % Final    Monocytes % 11/10/2024 6.8  3.0 -  10.0 % Final    Eosinophils % 11/10/2024 1.5  0.0 - 5.0 % Final    Basophils % 11/10/2024 0.5  0.0 - 2.0 % Final    Immature Granulocytes % 11/10/2024 0.2  0.0 - 0.5 % Final    Neutrophils Absolute 11/10/2024 4.80  1.80 - 8.00 K/UL Final    Lymphocytes Absolute 11/10/2024 0.73 (L)  0.90 - 3.60 K/UL Final    Monocytes Absolute 11/10/2024 0.41  0.05 - 1.20 K/UL Final    Eosinophils Absolute 11/10/2024 0.09  0.00 - 0.40 K/UL Final    Basophils Absolute 11/10/2024 0.03  0.00 - 0.10 K/UL Final    Immature Granulocytes Absolute 11/10/2024 0.01  0.00 - 0.04 K/UL Final     Differential Type 11/10/2024 AUTOMATED    Final    Cholesterol, Total 11/10/2024 171  MG/DL Final    Comment: Low: Less than or equal to 200 mg/dL  Borderline High: 798-760 mg/dL  High: Greater than or equal to 240 mg/dL      Triglycerides 87/94/7974 64  0 - 150 MG/DL Final    Comment: Borderline High: 150-199 mg/dL, High: 799-500 mg/dL  Very High: Greater than or equal to 500 mg/dL      HDL 87/94/7974 69 (H)  40 - 60 MG/DL Final    LDL Cholesterol 11/10/2024 89  0 - 100 MG/DL Final    Comment: Near Optimal: 100-129 mg/dL  Borderline High: 869-840, High: 160-189 mg/dL  Very High: Greater than or equal to 190 mg/dL  Friedewald Equation      VLDL Cholesterol Calculated 11/10/2024 13  MG/DL Final    Chol/HDL Ratio 11/10/2024 2.5  0 - 5.0   Final    Sodium 11/10/2024 141  136 - 145 mmol/L Final    Potassium 11/10/2024 4.6  3.5 - 5.5 mmol/L Final    Chloride 11/10/2024 100  98 - 107 mmol/L Final    CO2 11/10/2024 32  21 - 32 mmol/L Final    Anion Gap 11/10/2024 9  3.0 - 18.0 mmol/L Final    Glucose 11/10/2024 86  74 - 108 mg/dL Final    Comment: <29 mg/dL Consistent with, but not fully diagnostic of hypoglycemia.  100 - 125 mg/dL Impaired fasting glucose/consistent with pre-diabetes mellitus.  > 126 mg/dl Fasting glucose consistent with overt diabetes mellitus      BUN 11/10/2024 10  6 - 23 MG/DL Final    Creatinine 87/94/7974 0.69  0.6 - 1.3 MG/DL Final    BUN/Creatinine Ratio 11/10/2024 14  12 - 20   Final    Est, Glom Filt Rate 11/10/2024 >90  >60 ml/min/1.80m2 Final    Comment:    Pediatric calculator link: https://www.kidney.org/professionals/kdoqi/gfr_calculatorped     These results are not intended for use in patients <32 years of age.     eGFR results are calculated without a race factor using  the 2021 CKD-EPI equation. Careful clinical correlation is recommended, particularly when comparing to results calculated using previous equations.  The CKD-EPI equation is less accurate in patients with extremes of muscle  mass, extra-renal metabolism of creatinine, excessive creatine ingestion, or following therapy that affects renal tubular secretion.      Calcium  11/10/2024 9.9  8.6 - 10.2 MG/DL Final    Total Bilirubin 11/10/2024 0.5  0.2 - 1.0 MG/DL Final    ALT 87/94/7974 14  10 - 35 U/L Final    AST 11/10/2024 16  10 - 38 U/L Final    Alk Phosphatase 11/10/2024 87  45 - 117 U/L Final    Total Protein 11/10/2024 6.6  6.4 - 8.2 g/dL Final    Albumin 87/94/7974 3.7  3.4 - 5.0 g/dL Final    Globulin 87/94/7974 2.8  2.0 - 4.0 g/dL Final    Albumin/Globulin Ratio 11/10/2024 1.3  0.8 - 1.7   Final    Vit D, 25-Hydroxy 11/10/2024 50.2  30.0 - 100.0 ng/mL Final    Comment: Deficiency         <20.0 ng/mL  Insufficiency       20.0-29.9 ng/mL      TSH, 3rd Generation 11/10/2024 15.700 (H)  0.27 - 4.20 uIU/mL Final    T4 Free 11/10/2024 0.8 (L)  0.9 - 1.7 NG/DL Final     MRI of the brain without contrast: Nov, 2023:      TECHNIQUE: AXIAL FLAIR, T1, T2, and T2 weighted gradient echo images were  obtained with sagittal T1 weighted images. And axial diffusion weighted sequence  was also obtained. No intravenous contrast was administered.     FINDINGS: No intracranial mass lesion, hemorrhage, or territorial infarction is  demonstrated. There is mild cerebral atrophy. There are foci of increased signal  intensity on long TR images suggestive of mild microvascular ischemic changes.  The microvascular ischemic changes are relatively more advanced on the current  study than the MRI done on 02/01/2018. The cerebellar tonsils extend  approximately 8 mm inferior to the level of the foramen magnum compatible with a  Chiari I malformation. No significant cranial cervical stenosis is noted.  Diffusion-weighted images show no regions of restricted diffusion. No subfalcine  or transtentorial herniation is noted.     IMPRESSION:  Findings suggestive of mild microvascular ischemic changes. The  microvascular ischemic changes are relatively more advanced on  the current study  than the MRI done on 02/01/2018. Chiari I malformation.      Assessment:  1. Chiari malformation type I (HCC)  - MRI BRAIN WO CONTRAST; Future  2. Unsteadiness  - MRI BRAIN WO CONTRAST; Future  Patient has a diagnosis of Chiari malformation since around 2010; at the time of ACDF for cervical spondylosis.  Was not interested in surgery; but was seen by neurosurgery.  Last MRI of the brain from October 2023 as above: 8 mm descent of the cerebellar tonsils below the foramen magnum; no hydrocephalus.  Previous MRI from February 2019 reported descent of by 12 mm.  Patient feels unsteady/off-balance.  No occipital pain.  No pain with straining/coughing.  No syncopal spells.  No problem swallowing; no hoarseness of voice.  Will repeat her brain MRI without contrast.  Will see her again in 3 months time.        PLEASE NOTE:   This document has been produced using voice recognition software. Unrecognized errors in transcription may be present.   "

## 2024-12-08 ENCOUNTER — Encounter: Payer: Medicare (Managed Care) | Attending: Orthopaedic Surgery | Primary: Internal Medicine

## 2024-12-08 ENCOUNTER — Ambulatory Visit
Admit: 2024-12-08 | Discharge: 2024-12-08 | Payer: Medicare (Managed Care) | Attending: Orthopaedic Surgery | Primary: Internal Medicine

## 2024-12-08 DIAGNOSIS — M5441 Lumbago with sciatica, right side: Principal | ICD-10-CM

## 2024-12-08 MED ORDER — PREDNISONE 5 MG PO TABS
5 | ORAL_TABLET | Freq: Every day | ORAL | 0 refills | 5.50000 days | Status: AC
Start: 2024-12-08 — End: 2025-01-05

## 2024-12-08 MED ORDER — BACLOFEN 10 MG PO TABS
10 | ORAL_TABLET | Freq: Every evening | ORAL | 0 refills | 10.00000 days | Status: DC | PRN
Start: 2024-12-08 — End: 2024-12-15

## 2024-12-08 NOTE — Progress Notes (Signed)
 "        Patient: Elaine [name]                MRN: [id number]       SSN: [id number]  Date of Birth: 11/01/1951        AGE: 74 y.o.        SEX: female    PCP: Waldon Marcus CROME, MD  12/08/24    Chief Complaint: Back Problem      1. Right-sided low back pain with right-sided sciatica, unspecified chronicity  -     MRI LUMBAR SPINE WO CONTRAST; Future  -     predniSONE  (DELTASONE ) 5 MG tablet; Take 1 tablet by mouth daily for 28 days, Disp-28 tablet, R-0Normal  -     baclofen  (LIORESAL ) 10 MG tablet; Take 0.5 tablets by mouth nightly as needed (muscle spasms), Disp-15 tablet, R-0Normal            HPI:  Elaine [name] is a 74 y.o. female with chief complaint of   Chief Complaint   Patient presents with    Hip Pain     Lt      -05/10/2023: Left knee cortisone injection, 2 months of excellent relief  -01/08/2023: Left total hip arthroplasty, posterior lateral approach    We have been planning on a left total hip arthroplasty through the anterolateral approach due to some pain in the lateral hip as well as a overhanging pannus.  She recently, in July, had a GI bleed and has had a cascade of medical issues since then.  She is hoping that she can have her hip replaced at the beginning of the year.        08/16/2023     9:15 AM   AMB PAIN ASSESSMENT   Location of Pain Knee   Location Modifiers Left   Severity of Pain 0     Tobacco Use: Medium Risk (11/21/2024)    Patient History     Smoking Tobacco Use: Former     Smokeless Tobacco Use: Never     Passive Exposure: Never     IMAGING:  Imaging read by myself and interpreted as follows:    October 12, 2024:  Three-view x-ray of the right hip including AP pelvis and AP and crosstable lateral of the right hip taken at the [facility] office demonstrates approximately 50% joint space narrowing of the right hip with small osteophytes and mild subchondral sclerosis.  There are no intraosseous abnormalities.  There is some evidence of disc space narrowing and mild to moderate arthritic  changes in the partially visualized lower lumbar spine.      May 10, 2023:  4 view x-ray of the left knee including AP, lateral, sunrise and notch views demonstrate tricompartmental osteoarthritic signs most affecting the lateral compartment which has bone-on-bone articulation with subchondral cysts and subchondral sclerosis.  There are tricompartmental osteophytes.  There is also subchondral sclerosis and mild lateral patellar tilt in the patellofemoral joint.      February 01, 2023:  Three-view x-ray of the left hip including AP pelvis and AP and lateral of the left hip demonstrates maintenance of the position of the implants without change in position or evidence of subsidence or fracture.  No evidence of complication.  The left leg is approximately centimeter longer than the right with mildly increased offset.      January 18, 2023:  3 view x-ray of the left hip including AP pelvis, AP and lateral, demonstrates a  well positioned left total hip arthroplasty without signs of loosening or complication. Leg lengths appear to be equal.     Dec 08, 2022:  3 view x-ray of the left hip including AP pelvis, and AP and lateral demonstrating severe joint space narrowing with evidence of osteophytosis, subchondral sclerosis, and subchondral cyst formation.     2 view x-ray of the lumbar spine, sitting and standing, demonstrates facet joint arthropathy and decreased disc space at multiple levels. The change in SS is 21.2.     September 25, 2022:  3 view x-ray of the left hip including AP pelvis and AP and crosstable lateral of the left hip demonstrate significant joint space loss acetabular joint of approximately 90%.  There is osteophytosis,  subchondral sclerosis and subchondral cyst formation.      PHYSICAL EXAMINATION:  There were no vitals taken for this visit.  There is no height or weight on file to calculate BMI.  Wt Readings from Last 3 Encounters:   12/04/24 71.7 kg (158 lb)   11/21/24 70.8 kg (156 lb)   10/12/24  72.1 kg (159 lb)       GENERAL: Alert and oriented x3, in no acute distress.  HEENT: Normocephalic, atraumatic.    MSK:   Examination Right hip Left hip   Skin Intact Intact   Flexion ROM 100 100   Extension ROM 0 0   External Rotation ROM 45 30   Internal Rotation ROM 20 10   Abduction ROM 30 30   Radicular tension - -   FADIR - -   FABER - -   Log roll test - -   Trochanteric tenderness + -   Weakness - -   Neurovascular Intact Intact   Negative Trendelenburg sign      ASSESSMENT & PLAN:    December 08, 2024:  Lumbar pain with right sided sciatica.  The patient is describing pain that starts in the back and travels all the way down her right leg all the way to her toes and into the groin.  She says the pain is different from the pain she had on the left that was relieved by her left hip replacement.  I spoke to her by telephone.  She had appointment scheduled this afternoon in person but I had surgery so I had to cancel it.  She agreed to a telephone visit instead.  She did get a Medrol Dosepak in the emergency department a little over a week ago and this decreased her pain but it never at the pain completely away.  For she is telling me it is sounds like radiculopathy.  I have prescribed physical therapy and lidocaine  patches for her in the past without significant benefit.  I we will order a stat MRI of her lumbar spine and also gave her some prednisone  and a small prescription of Flexeril.  She has allergies to hydrocodone and oxycodone .  I will have her back once the MRI is complete for further evaluation.Total Time: Minutes: 5-10 minutes    Barrett A Brazeau was evaluated through a synchronous (real-time) audio encounter. Patient identification was verified at the start of the visit. She (or guardian if applicable) is aware that this is a billable service, which includes applicable co-pays. This visit was conducted with the patient's (and/or legal guardian's) verbal consent. She has not had a related appointment  within my department in the past 7 days or scheduled within the next 24 hours.   The patient was located  at Home: [address]  [place] [address].  The provider was located at [employer] (Appt Dept): [address] [place], [address]  [place],  [place] [address].  Confirm you are appropriately licensed, registered, or certified to deliver care in the state where the patient is located as indicated above. If you are not or unsure, please re-schedule the visit: Yes, I confirm.     Note: not billable if this call serves to triage the patient into an appointment for the relevant concern    October 12, 2024:  Right hip trochanteric bursitis.  She is doing very well with her left hip replacement nearly 2 years out.  I will get her into formal physical therapy for her right hip.  She already uses Tylenol  and lidocaine  patches.  I can see her back on an as-needed basis.  We did talk about the possibility of cortisone injections but considering her pain usually only starts in the morning and dissipates about an hour after she wakes up we decided that physical therapy would be a better start.     August 16, 2023:  Left knee osteoarthritis and status post left total hip arthroplasty of the posterior lateral approach.  She continues to be doing very well with the left hip.  The left knee has not been bothering her.  She got excellent relief from her knee shots for about 2 months but it is still not as bad as it was before the shot.  I discussed gel injections with her and she would like to move forward with those in the hopes of having a longer lasting relief as well as not risking elevation in her blood sugar with her diabetes.  I will have her back when the shots are approved.  Of note she is also complaining of significant left shoulder pain and is having restricted range of motion.  She uses patches continuously to help with that pain.  I will refer to Dr. Luciano for further evaluation.      May 10, 2023:  Left knee  osteoarthritis and 67-month status post left total hip arthroplasty through posterior lateral approach.  She is having no pain in her hip but her knee is bothering her quite a bit.  She will get sharp pains in from time to time.  It hurts in both the medial and lateral sides of the joint.  We talked about over-the-counter medications and she is already taking Tylenol  and that knee continues to hurt.  Due to her history of GI bleed she cannot take NSAIDs.  I offered her a cortisone injection and she would like to move forward with it.  We did discuss that with her prediabetes it can elevate her blood sugar and potentially her A1c slightly.  The shot was given and she tolerated it well.  I like to see her back and 3 months for reevaluation of the left knee.  I did prescribe her Voltaren  gel.    Apr 12, 2023:  3 months status post left total hip arthroplasty, posterolateral approach.  Assessment: Patient presents today after calling after-hours services this weekend due to swelling in her left ankle and foot.  She denies ever having any pain or redness, simply swelling.  She was instructed to go to the emergency department if she began to develop shortness of breath or calf pain/swelling/discoloration.  She denies ever feeling the symptoms but was instructed to come into the office to be seen on Monday.  She is in no  pain today and states that she never hit her ankle or anything and does not remember an injury.  She has no tenderness to palpation just a small area of effusion just anterior to her lateral malleolus.  Since she called her swelling has improved by almost 90%.  She denies ever having any hip pain or pain down her leg.  Plan: Original plan for home PT and then transition to physical therapy  Follow up: In 2 months to check on her progress.  No x-rays will be needed.      March 01, 2023:  6-week status post left total hip arthroplasty through posterior lateral approach.  She is doing very well and she is very  happy.  She is not having any pain and she is able to ambulate short distances without her rollator but is not completely steady on her feet yet.  She has graduated from home physical therapy and is starting outpatient physical therapy later this week.  I like to see her again in 2 months to check on her progress.  No x-rays needed at that visit.      February 01, 2023:  3 and half weeks status post left total hip arthroplasty through the posterior lateral approach.  Patient had a fall about 4 days ago when she slipped on the hardwood floor wearing socks without grippers on the.  She reports that she is feeling much better and she is ambulating well today.  She is complaining more of left shoulder pain than her hip.  She has seen Dr. Mee in the past and they urged her to make a new appointment with him.  I will have home health work with the patient for the next 2 weeks as well as do a home assessment.  If she needs further physical therapy following those 2 weeks we can order outpatient physical therapy.  She does have a small abrasion in the gluteal cleft which is not concerning and seems to be healing well.  I will see her in 6 weeks and no x-rays will be needed unless there is any increasing pain.    January 18, 2023:  10 days s/p left THA, lateral approach. Pt is continuing to improve. Her pain fluctuates based off of her activity level. She was getting up and walking 8-10 steps every hour but this started to increase her pain so they have cut it back to every 2 hours but continue to do exercise while she is seated. She had an allergic reaction to the oxycodone  that was given to her post op and she developed intense itching. She stopped the oxycodone  once this started and I prescribed her hydroxyzine  due to her allergy to benadryl. The itching has subsided since. She is currently taking tramadol  for her pain every 8 hours along with tylenol  and reports that this is helping with her pain. She is also taking  the eliquis  prescribed to her due to her aspirin allergy. She denies calf pain and has no calf pain, swelling, and negative homan's on exam. Her incision is clean, dry, and intact without ttp or erythema. She states that most of her pain is around her incision. She continues to walk with a walker for support. We will have her follow up with Dr. Marlyse on 4 weeks.     She also asked for lidocaine  patches today, which were prescribed, and I will refill her tramadol  when it is due this coming Friday.       December 08, 2022:  Primary osteoarthritis of the left hip. Pt is here for her H&P appointment prior to her 12/23/22 THA. 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 She has been cleared by her PCP for surgery and will be following up with her hematologist prior to her surgery for clearance due to her chronic iron deficiency anemia and low hemoglobin numbers on her last labs. They are out of the [facility] network, so they will be faxing that over once completed. She receives iron infusions every other month and had her last one in November.     She has allergies to aspirin and diphenhydramine. Due to this she will get lovenox  30 mg as DVT prophylaxis. Due to her history of GI bleed, we will not prescribe meloxiam. I will order her post operative medications once she receives clearance from hematology.     September 25, 2022:  We will continue to plan on doing a left total hip arthroplasty through an anterolateral approach.  Again addressed the benefits and risks of anterolateral versus direct anterior the patient would like to continue the anterior lateral plan.  She does need to improve her health and get stabilized with recent GI bleed.  We will hope to move forward after the first of the year.    Apr 20, 2022:  She failed conservative treatment with Tylenol , ibuprofen and gabapentin .  She did get  a left hip corticosteroid injection intra-articularly which gave her only a month of greater than 90% relief of her pain.  At this point would like to go forward with a left total hip replacement through the anterolateral approach.  I did discuss with her the risks and benefits of a direct anterior versus anterolateral approach and she did agree to go forward with anterolateral approach.  She has an overhanging pannus that reaches down the thigh quite a bit.  She is currently working on adjusting her thyroid medications to get her hypothyroidism under control.  Her other medical comorbidities under medication control at this time.  She is currently working in childcare and would like to go forward with surgery in July as her last day of work is the end of June.      Past Medical History:   Diagnosis Date    Anemia     Asthma     Colon polyp     DDD (degenerative disc disease) 02/15/2009    Depression 12/18/2011    DVT (deep venous thrombosis) (HCC) 10/23/2009    Left leg:  No DVT.      Essential hypertension, benign     History of blood transfusion     History of echocardiogram 03/20/2004    EF 70%.  Borderline DDfx.  No significant valvular pathology.    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  Neg EKG on pharm stress test.    Thyroid disease     hypothyroidism       Family History   Problem Relation Age of Onset    Hypertension Sister         x3     Hypertension Mother     Cancer Mother     Heart Surgery Sister     Heart Disease Sister     Hypertension Paternal Uncle     Cancer Maternal Aunt         breast    Breast Cancer Maternal Aunt     Glaucoma Son     Heart Defect Son     Diabetes Paternal Uncle     Heart Attack Sister        Current Outpatient Medications   Medication Sig Dispense Refill    predniSONE  (DELTASONE ) 5 MG tablet Take 1 tablet by mouth daily for 28 days 28 tablet 0    baclofen  (LIORESAL ) 10 MG tablet Take 0.5 tablets by mouth nightly as needed (muscle spasms) 15 tablet 0    montelukast  (SINGULAIR ) 10 MG tablet TAKE 1 TABLET EVERY DAY 90 tablet 3    gabapentin  (NEURONTIN ) 300 MG capsule Take 1 capsule by mouth 3 times daily for 180 days. Max Daily Amount: 900 mg 270 capsule 1    atorvastatin  (LIPITOR) 40 MG tablet TAKE 1 TABLET EVERY DAY (NEED MD APPOINTMENT) 90 tablet 3    levothyroxine  (SYNTHROID ) 88 MCG tablet Take 1 tablet by mouth Daily 90 tablet 2    valsartan  (DIOVAN ) 320 MG tablet Take 1 tablet by mouth daily 90 tablet 2    amLODIPine  (NORVASC ) 5 MG tablet Take 1 tablet by mouth daily 90 tablet 2    moxifloxacin (VIGAMOX) 0.5 % ophthalmic solution instill 1 drop into right eye four times a day START 3 days prior to procedure      pantoprazole  (PROTONIX ) 40 MG tablet 1 tab(s) orally once a day      prednisoLONE acetate (PRED FORTE) 1 % ophthalmic suspension instill 1 drop into right eye 4 times daily starting 3 days prior to procedure      diclofenac  sodium (VOLTAREN ) 1 % GEL Apply 4 g topically 4 times daily (Patient not taking: Reported on 12/04/2024) 350 g 5    vitamin C (ASCORBIC ACID) 500 MG tablet Take 1 tablet by mouth daily      Multiple Vitamins-Minerals (MULTIVITAMIN ADULTS 50+ PO) Take 1 tablet by mouth daily      Misc Natural Products (NEURIVA PO) Take 1 tablet by mouth in the morning and at bedtime      NONFORMULARY Take 1 tablet by mouth every evening Viviscal -1 tablet every evening for hair and scalp      potassium  chloride (KLOR-CON  M) 10 MEQ extended release tablet TAKE 1 TABLET EVERY DAY 90 tablet 3    albuterol  sulfate HFA (PROVENTIL ;VENTOLIN ;PROAIR ) 108 (90 Base) MCG/ACT inhaler Inhale 2 puffs into the lungs every 4 hours as needed for Wheezing or Shortness of Breath 18 g 5    cyanocobalamin 1000 MCG tablet Take 1 tablet by mouth every evening      ZINC PO Take 1 tablet by mouth daily      latanoprost (XALATAN) 0.005 % ophthalmic solution Place 1 drop into both eyes nightly      lidocaine  (LIDODERM ) 5 % Place 1 patch onto  the skin every 24 hours 12 hours on and 12 hours off       No current facility-administered medications for this visit.        Allergies   Allergen Reactions    Aspirin Other (See Comments)     GI distress,     Diphenhydramine Other (See Comments)     Muscle jerking    Hydrocodone      Other Reaction(s): Unknown    Oxycodone      Pollen Extract      Other reaction(s): Not Reported This Time       Past Surgical History:   Procedure Laterality Date    BREAST BIOPSY      BREAST SURGERY      COLONOSCOPY N/A 04/25/2020    SCREENING COLONOSCOPY performed by Pattie Dale PARAS, MD at [facility] ENDOSCOPY    COLONOSCOPY  02-24-12    normal (Dr. Pattie)    COLONOSCOPY FLX DX W/COLLJ [facility] WHEN PFRMD  02-2007    +polyp(tubular adenoma); Dr Pattie    HERNIA REPAIR      3x    HYSTERECTOMY (CERVIX STATUS UNKNOWN)      NEUROLOGICAL SURGERY  03-16-2009    s/p ACD & Fusion (Dr.P.Gurtner)    TOTAL HIP ARTHROPLASTY Left 01/08/2023    LEFT HIP TOTAL ARTHROPLASTY LATERAL APPROACH performed by Marlyse Morene ORN, DO at [facility] MAIN OR    TUBAL LIGATION      UPPER GASTROINTESTINAL ENDOSCOPY N/A 06/12/2022    ENTEROSCOPY PUSH DIAGNOSTIC w/small intestinal bipolar cautery of multiple AVMs performed by Dale PARAS Pattie, MD at [facility] ENDOSCOPY       Social History     Socioeconomic History    Marital status: Divorced     Spouse name: Not on file    Number of children: Not on file    Years of education: Not on file    Highest education level: Not on  file   Occupational History    Not on file   Tobacco Use    Smoking status: Former     Current packs/day: 0.00     Average packs/day: 1 pack/day for 27.0 years (27.0 ttl pk-yrs)     Types: Cigarettes     Start date: 10/16/1977     Quit date: 04/25/2004     Years since quitting: 20.6     Passive exposure: Never    Smokeless tobacco: Never   Vaping Use    Vaping status: Never Used   Substance and Sexual Activity    Alcohol use: No    Drug use: Never    Sexual activity: Not Currently   Other Topics Concern    Not on file   Social History Narrative    Not on file     Social Drivers of Health     Financial Resource Strain: Low Risk  (01/04/2023)    Overall Financial Resource Strain (CARDIA)     Difficulty of Paying Living Expenses: Not hard at all   Food Insecurity: No Food Insecurity (02/15/2024)    Hunger Vital Sign     Worried About Running Out of Food in the Last Year: Never true     Ran Out of Food in the Last Year: Never true   Transportation Needs: No Transportation Needs (02/15/2024)    PRAPARE - Therapist, Art (Medical): No     Lack of Transportation (Non-Medical): No   Physical Activity: Insufficiently Active (02/15/2024)    Exercise Vital  Sign     Days of Exercise per Week: 2 days     Minutes of Exercise per Session: 30 min   Stress: No Stress Concern Present (01/04/2023)    [employer] of Occupational [employer] - Occupational Stress Questionnaire     Feeling of Stress : Not at all   Social Connections: Feeling Socially Integrated (02/26/2023)    OASIS D0700: Social Isolation     Frequency of experiencing loneliness or isolation: Never   Intimate Partner Violence: Not At Risk (01/04/2023)    Humiliation, Afraid, Rape, and Kick questionnaire     Fear of Current or Ex-Partner: No     Emotionally Abused: No     Physically Abused: No     Sexually Abused: No   Housing Stability: Low Risk  (02/15/2024)    Housing Stability Vital Sign     Unable to Pay for Housing in the Last Year: No     Number  of Times Moved in the Last Year: 0     Homeless in the Last Year: No       REVIEW OF SYSTEMS:      No changes from previous review of systems unless noted.      Prescription medication management discussed with patient.     Electronically signed by: Morene LELON Dills, DO    Note: This note was completed using voice recognition software.  Any typographical/name errors or mistakes are unintentional.     "

## 2024-12-09 ENCOUNTER — Inpatient Hospital Stay: Admit: 2024-12-09 | Payer: Medicare (Managed Care) | Attending: Orthopaedic Surgery | Primary: Internal Medicine

## 2024-12-09 DIAGNOSIS — M5441 Lumbago with sciatica, right side: Secondary | ICD-10-CM

## 2024-12-15 ENCOUNTER — Ambulatory Visit
Admit: 2024-12-15 | Discharge: 2024-12-15 | Payer: Medicare (Managed Care) | Attending: Orthopaedic Surgery | Primary: Internal Medicine

## 2024-12-15 VITALS — Ht 62.0 in

## 2024-12-15 DIAGNOSIS — M5441 Lumbago with sciatica, right side: Principal | ICD-10-CM

## 2024-12-15 MED ORDER — BACLOFEN 10 MG PO TABS
10 | ORAL_TABLET | Freq: Every evening | ORAL | 0 refills | 10.00000 days | Status: AC | PRN
Start: 2024-12-15 — End: ?

## 2024-12-15 MED ORDER — ACETAMINOPHEN 500 MG PO TABS
500 | ORAL_TABLET | Freq: Three times a day (TID) | ORAL | 0 refills | 8.00000 days | Status: AC | PRN
Start: 2024-12-15 — End: 2025-01-14

## 2024-12-15 NOTE — Progress Notes (Addendum)
 Patient: Elaine [name]                MRN: [id number]       SSN: [id number]  Date of Birth: 09/19/1951        AGE: 74 y.o.        SEX: female    PCP: Waldon Marcus CROME, MD  12/15/24    Chief Complaint: Follow-up (Right hip )      1. Right-sided low back pain with right-sided sciatica, unspecified chronicity  -     baclofen  (LIORESAL ) 10 MG tablet; Take 0.5 tablets by mouth nightly as needed (muscle spasms), Disp-15 tablet, R-0Normal  -     acetaminophen  (TYLENOL ) 500 MG tablet; Take 2 tablets by mouth every 8 hours as needed for Pain, Disp-180 tablet, R-0Normal  -     [facility] [place]  [facility], [place] ([address] [place])        HPI:  Elaine [name] is a 74 y.o. female with chief complaint of   Chief Complaint   Patient presents with    Hip Pain     Lt      -05/10/2023: Left knee cortisone injection, 2 months of excellent relief  -01/08/2023: Left total hip arthroplasty, posterior lateral approach    We have been planning on a left total hip arthroplasty through the anterolateral approach due to some pain in the lateral hip as well as a overhanging pannus.  She recently, in July, had a GI bleed and has had a cascade of medical issues since then.  She is hoping that she can have her hip replaced at the beginning of the year.        08/16/2023     9:15 AM   AMB PAIN ASSESSMENT   Location of Pain Knee   Location Modifiers Left   Severity of Pain 0     Tobacco Use: Medium Risk (12/15/2024)    Patient History     Smoking Tobacco Use: Former     Smokeless Tobacco Use: Never     Passive Exposure: Never     IMAGING:  Imaging read by myself and interpreted as follows:    December 09, 2024:  MRI lumbar spine:  IMPRESSION:     **Multilevel mild to moderate degrees foraminal stenosis as discussed.  Compression and slight **deformity of right foraminal/extraforaminal L4 and L5  nerve roots due to small disc protrusions.  -Interval worsening of multilevel advanced degenerative disc  and facet joint  disease with multilevel listhesis most pronounced L3-S1.  -Severe multifactorial spinal canal stenosis at mid L4 vertebral level and  moderate canal stenosis at other levels.      October 12, 2024:  Three-view x-ray of the right hip including AP pelvis and AP and crosstable lateral of the right hip taken at the [facility] office demonstrates approximately 50% joint space narrowing of the right hip with small osteophytes and mild subchondral sclerosis.  There are no intraosseous abnormalities.  There is some evidence of disc space narrowing and mild to moderate arthritic changes in the partially visualized lower lumbar spine.      May 10, 2023:  4 view x-ray of the left knee including AP, lateral, sunrise and notch views demonstrate tricompartmental osteoarthritic signs most affecting the lateral compartment which has bone-on-bone articulation with subchondral cysts and subchondral sclerosis.  There are tricompartmental osteophytes.  There is also subchondral sclerosis and mild lateral patellar tilt in the patellofemoral joint.  February 01, 2023:  Three-view x-ray of the left hip including AP pelvis and AP and lateral of the left hip demonstrates maintenance of the position of the implants without change in position or evidence of subsidence or fracture.  No evidence of complication.  The left leg is approximately centimeter longer than the right with mildly increased offset.      January 18, 2023:  3 view x-ray of the left hip including AP pelvis, AP and lateral, demonstrates a well positioned left total hip arthroplasty without signs of loosening or complication. Leg lengths appear to be equal.     Dec 08, 2022:  3 view x-ray of the left hip including AP pelvis, and AP and lateral demonstrating severe joint space narrowing with evidence of osteophytosis, subchondral sclerosis, and subchondral cyst formation.     2 view x-ray of the lumbar spine, sitting and standing, demonstrates facet joint  arthropathy and decreased disc space at multiple levels. The change in SS is 21.2.     September 25, 2022:  3 view x-ray of the left hip including AP pelvis and AP and crosstable lateral of the left hip demonstrate significant joint space loss acetabular joint of approximately 90%.  There is osteophytosis,  subchondral sclerosis and subchondral cyst formation.      PHYSICAL EXAMINATION:  Ht 1.575 m (5' 2)   BMI 28.90 kg/m   Body mass index is 28.9 kg/m.  Wt Readings from Last 3 Encounters:   12/04/24 71.7 kg (158 lb)   11/21/24 70.8 kg (156 lb)   10/12/24 72.1 kg (159 lb)       GENERAL: Alert and oriented x3, in no acute distress.  HEENT: Normocephalic, atraumatic.    MSK:   Examination Right hip Left hip   Skin Intact Intact   Flexion ROM 100 100   Extension ROM 0 0   External Rotation ROM 45 30   Internal Rotation ROM 20 10   Abduction ROM 30 30   Radicular tension - -   FADIR - -   FABER - -   Log roll test - -   Trochanteric tenderness + -   Weakness - -   Neurovascular Intact Intact   Negative Trendelenburg sign      ASSESSMENT & PLAN:    December 15, 2024:  Spinal stenosis with right-sided sciatica.  Patient reports that her symptoms greatly improved after the baclofen  and prednisone .  She is no longer having pain shooting down her leg and no longer having spasms.  She still has some pain around the groin area but has excellent range of motion of the hip.  I had prescribed her physical therapy when I saw her in November and she has it scheduled for next week.  I would like her to continue with that.  I will give her a refill of baclofen  as well as Tylenol  and meloxicam.  I also refer her to mast 1.      December 08, 2024:  Lumbar pain with right sided sciatica.  The patient is describing pain that starts in the back and travels all the way down her right leg all the way to her toes and into the groin.  She says the pain is different from the pain she had on the left that was relieved by her left hip replacement.   I spoke to her by telephone.  She had appointment scheduled this afternoon in person but I had surgery so I had to cancel it.  She agreed to a telephone visit instead.  She did get a Medrol Dosepak in the emergency department a little over a week ago and this decreased her pain but it never at the pain completely away.  For she is telling me it is sounds like radiculopathy.  I have prescribed physical therapy and lidocaine  patches for her in the past without significant benefit.  I we will order a stat MRI of her lumbar spine and also gave her some prednisone  and a small prescription of Flexeril.  She has allergies to hydrocodone and oxycodone .  I will have her back once the MRI is complete for further evaluation.Total Time: Minutes: 5-10 minutes    Elaine [name] was evaluated through a synchronous (real-time) audio encounter. Patient identification was verified at the start of the visit. She (or guardian if applicable) is aware that this is a billable service, which includes applicable co-pays. This visit was conducted with the patient's (and/or legal guardian's) verbal consent. She has not had a related appointment within my department in the past 7 days or scheduled within the next 24 hours.   The patient was located at Home: [address]  [place] [address].  The provider was located at [employer] (Appt Dept): [address] [place], [address]  [place],  [place] [address].  Confirm you are appropriately licensed, registered, or certified to deliver care in the state where the patient is located as indicated above. If you are not or unsure, please re-schedule the visit: Yes, I confirm.     Note: not billable if this call serves to triage the patient into an appointment for the relevant concern    October 12, 2024:  Right hip trochanteric bursitis.  She is doing very well with her left hip replacement nearly 2 years out.  I will get her into formal physical therapy for her right hip.  She already uses Tylenol  and  lidocaine  patches.  I can see her back on an as-needed basis.  We did talk about the possibility of cortisone injections but considering her pain usually only starts in the morning and dissipates about an hour after she wakes up we decided that physical therapy would be a better start.     August 16, 2023:  Left knee osteoarthritis and status post left total hip arthroplasty of the posterior lateral approach.  She continues to be doing very well with the left hip.  The left knee has not been bothering her.  She got excellent relief from her knee shots for about 2 months but it is still not as bad as it was before the shot.  I discussed gel injections with her and she would like to move forward with those in the hopes of having a longer lasting relief as well as not risking elevation in her blood sugar with her diabetes.  I will have her back when the shots are approved.  Of note she is also complaining of significant left shoulder pain and is having restricted range of motion.  She uses patches continuously to help with that pain.  I will refer to Dr. Luciano for further evaluation.      May 10, 2023:  Left knee osteoarthritis and 15-month status post left total hip arthroplasty through posterior lateral approach.  She is having no pain in her hip but her knee is bothering her quite a bit.  She will get sharp pains in from time to time.  It hurts in both the medial and lateral  sides of the joint.  We talked about over-the-counter medications and she is already taking Tylenol  and that knee continues to hurt.  Due to her history of GI bleed she cannot take NSAIDs.  I offered her a cortisone injection and she would like to move forward with it.  We did discuss that with her prediabetes it can elevate her blood sugar and potentially her A1c slightly.  The shot was given and she tolerated it well.  I like to see her back and 3 months for reevaluation of the left knee.  I did prescribe her Voltaren  gel.    Apr 12, 2023:  3 months status post left total hip arthroplasty, posterolateral approach.  Assessment: Patient presents today after calling after-hours services this weekend due to swelling in her left ankle and foot.  She denies ever having any pain or redness, simply swelling.  She was instructed to go to the emergency department if she began to develop shortness of breath or calf pain/swelling/discoloration.  She denies ever feeling the symptoms but was instructed to come into the office to be seen on Monday.  She is in no pain today and states that she never hit her ankle or anything and does not remember an injury.  She has no tenderness to palpation just a small area of effusion just anterior to her lateral malleolus.  Since she called her swelling has improved by almost 90%.  She denies ever having any hip pain or pain down her leg.  Plan: Original plan for home PT and then transition to physical therapy  Follow up: In 2 months to check on her progress.  No x-rays will be needed.      March 01, 2023:  6-week status post left total hip arthroplasty through posterior lateral approach.  She is doing very well and she is very happy.  She is not having any pain and she is able to ambulate short distances without her rollator but is not completely steady on her feet yet.  She has graduated from home physical therapy and is starting outpatient physical therapy later this week.  I like to see her again in 2 months to check on her progress.  No x-rays needed at that visit.      February 01, 2023:  3 and half weeks status post left total hip arthroplasty through the posterior lateral approach.  Patient had a fall about 4 days ago when she slipped on the hardwood floor wearing socks without grippers on the.  She reports that she is feeling much better and she is ambulating well today.  She is complaining more of left shoulder pain than her hip.  She has seen Dr. Mee in the past and they urged her to make a new appointment  with him.  I will have home health work with the patient for the next 2 weeks as well as do a home assessment.  If she needs further physical therapy following those 2 weeks we can order outpatient physical therapy.  She does have a small abrasion in the gluteal cleft which is not concerning and seems to be healing well.  I will see her in 6 weeks and no x-rays will be needed unless there is any increasing pain.    January 18, 2023:  10 days s/p left THA, lateral approach. Pt is continuing to improve. Her pain fluctuates based off of her activity level. She was getting up and walking 8-10 steps every hour but this started  to increase her pain so they have cut it back to every 2 hours but continue to do exercise while she is seated. She had an allergic reaction to the oxycodone  that was given to her post op and she developed intense itching. She stopped the oxycodone  once this started and I prescribed her hydroxyzine  due to her allergy to benadryl. The itching has subsided since. She is currently taking tramadol  for her pain every 8 hours along with tylenol  and reports that this is helping with her pain. She is also taking the eliquis  prescribed to her due to her aspirin allergy. She denies calf pain and has no calf pain, swelling, and negative homan's on exam. Her incision is clean, dry, and intact without ttp or erythema. She states that most of her pain is around her incision. She continues to walk with a walker for support. We will have her follow up with Dr. Marlyse on 4 weeks.     She also asked for lidocaine  patches today, which were prescribed, and I will refill her tramadol  when it is due this coming Friday.       December 08, 2022:  Primary osteoarthritis of the left hip. Pt is here for her H&P appointment prior to her 12/23/22 THA. 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 She has been  cleared by her PCP for surgery and will be following up with her hematologist prior to her surgery for clearance due to her chronic iron deficiency anemia and low hemoglobin numbers on her last labs. They are out of the [place] network, so they will be faxing that over once completed. She receives iron infusions every other month and had her last one in November.     She has allergies to aspirin and diphenhydramine. Due to this she will get lovenox  30 mg as DVT prophylaxis. Due to her history of GI bleed, we will not prescribe meloxiam. I will order her post operative medications once she receives clearance from hematology.     September 25, 2022:  We will continue to plan on doing a left total hip arthroplasty through an anterolateral approach.  Again addressed the benefits and risks of anterolateral versus direct anterior the patient would like to continue the anterior lateral plan.  She does need to improve her health and get stabilized with recent GI bleed.  We will hope to move forward after the first of the year.    Apr 20, 2022:  She failed conservative treatment with Tylenol , ibuprofen and gabapentin .  She did get a left hip corticosteroid injection intra-articularly which gave her only a month of greater than 90% relief of her pain.  At this point would like to go forward with a left total hip replacement through the anterolateral approach.  I did discuss with her the risks and benefits of a direct anterior versus anterolateral approach and she did agree to go forward with anterolateral approach.  She has an overhanging pannus that reaches down the thigh quite a bit.  She is currently working on adjusting her thyroid medications to get her hypothyroidism under control.  Her other medical comorbidities under medication control at this time.  She is currently working in childcare and would like to go forward with surgery in July as her last day of work is the end of June.      Past Medical History:  Diagnosis Date    Anemia     Asthma     Colon polyp     DDD (degenerative disc disease) 02/15/2009    Depression 12/18/2011    DVT (deep venous thrombosis) (HCC) 10/23/2009    Left leg:  No DVT.      Essential hypertension, benign     History of blood transfusion     History of echocardiogram 03/20/2004    EF 70%.  Borderline DDfx.  No significant valvular pathology.    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  Neg EKG on pharm stress test.    Thyroid disease     hypothyroidism       Family History   Problem Relation Age of Onset    Hypertension Sister         x3    Hypertension Mother     Cancer Mother     Heart Surgery Sister     Heart Disease Sister     Hypertension Paternal Uncle     Cancer Maternal Aunt         breast    Breast Cancer Maternal Aunt     Glaucoma Son     Heart Defect Son     Diabetes Paternal Uncle     Heart Attack Sister        Current Outpatient Medications   Medication Sig Dispense Refill    baclofen  (LIORESAL ) 10 MG tablet Take 0.5 tablets by mouth nightly as needed (muscle spasms) 15 tablet 0    acetaminophen  (TYLENOL ) 500 MG tablet Take 2 tablets by mouth every 8 hours as needed for Pain 180 tablet 0    predniSONE  (DELTASONE ) 5 MG tablet Take 1 tablet by mouth daily for 28 days 28 tablet 0    montelukast  (SINGULAIR ) 10 MG tablet TAKE 1 TABLET EVERY DAY 90 tablet 3    gabapentin  (NEURONTIN ) 300 MG capsule Take 1 capsule by  mouth 3 times daily for 180 days. Max Daily Amount: 900 mg 270 capsule 1    atorvastatin  (LIPITOR) 40 MG tablet TAKE 1 TABLET EVERY DAY (NEED MD APPOINTMENT) 90 tablet 3    levothyroxine  (SYNTHROID ) 88 MCG tablet Take 1 tablet by mouth Daily 90 tablet 2    valsartan  (DIOVAN ) 320 MG tablet Take 1 tablet by mouth daily 90 tablet 2    amLODIPine  (NORVASC ) 5 MG tablet Take 1 tablet by mouth daily 90 tablet 2    moxifloxacin (VIGAMOX) 0.5 % ophthalmic solution instill 1 drop into right eye four times a day START 3 days prior to procedure      pantoprazole  (PROTONIX ) 40 MG tablet 1 tab(s) orally once a day      prednisoLONE acetate (PRED FORTE) 1 % ophthalmic suspension instill 1 drop into right eye 4 times daily starting 3  days prior to procedure      diclofenac  sodium (VOLTAREN ) 1 % GEL Apply 4 g topically 4 times daily (Patient not taking: Reported on 12/04/2024) 350 g 5    vitamin C (ASCORBIC ACID) 500 MG tablet Take 1 tablet by mouth daily      Multiple Vitamins-Minerals (MULTIVITAMIN ADULTS 50+ PO) Take 1 tablet by mouth daily      Misc Natural Products (NEURIVA PO) Take 1 tablet by mouth in the morning and at bedtime      NONFORMULARY Take 1 tablet by mouth every evening Viviscal -1 tablet every evening for hair and scalp      potassium chloride  (KLOR-CON  M) 10 MEQ extended release tablet TAKE 1 TABLET EVERY DAY 90 tablet 3    albuterol  sulfate HFA (PROVENTIL ;VENTOLIN ;PROAIR ) 108 (90 Base) MCG/ACT inhaler Inhale 2 puffs into the lungs every 4 hours as needed for Wheezing or Shortness of Breath 18 g 5    cyanocobalamin 1000 MCG tablet Take 1 tablet by mouth every evening      ZINC PO Take 1 tablet by mouth daily      latanoprost (XALATAN) 0.005 % ophthalmic solution Place 1 drop into both eyes nightly      lidocaine  (LIDODERM ) 5 % Place 1 patch onto the skin every 24 hours 12 hours on and 12 hours off       No current facility-administered medications for this visit.        Allergies   Allergen Reactions    Aspirin  Other (See Comments)     GI distress,     Diphenhydramine Other (See Comments)     Muscle jerking    Hydrocodone      Other Reaction(s): Unknown    Oxycodone      Pollen Extract      Other reaction(s): Not Reported This Time       Past Surgical History:   Procedure Laterality Date    BREAST BIOPSY      BREAST SURGERY      COLONOSCOPY N/A 04/25/2020    SCREENING COLONOSCOPY performed by Pattie Dale PARAS, MD at [facility] ENDOSCOPY    COLONOSCOPY  02-24-12    normal (Dr. Pattie)    COLONOSCOPY FLX DX W/COLLJ [facility] WHEN P[facility]  02-2007    +polyp(tubular adenoma); Dr Pattie    HERNIA REPAIR      3x    HYSTERECTOMY (CERVIX STATUS UNKNOWN)      NEUROLOGICAL SURGERY  03-16-2009    s/p ACD & Fusion (Dr.P.Gurtner)    TOTAL HIP ARTHROPLASTY Left 01/08/2023    LEFT HIP TOTAL ARTHROPLASTY LATERAL APPROACH performed by Marlyse Morene ORN, DO at [facility] MAIN OR    TUBAL LIGATION      UPPER GASTROINTESTINAL ENDOSCOPY N/A 06/12/2022    ENTEROSCOPY PUSH DIAGNOSTIC w/small intestinal bipolar cautery of multiple AVMs performed by Dale PARAS Pattie, MD at [facility] ENDOSCOPY       Social History     Socioeconomic History    Marital status: Divorced     Spouse name: Not on file    Number of children: Not on file    Years of education: Not on file    Highest education level: Not on file   Occupational History    Not on file   Tobacco Use    Smoking status: Former     Current packs/day: 0.00     Average packs/day: 1 pack/day for 27.0 years (27.0 ttl pk-yrs)     Types: Cigarettes  Start date: 10/16/1977     Quit date: 04/25/2004     Years since quitting: 20.6     Passive exposure: Never    Smokeless tobacco: Never   Vaping Use    Vaping status: Never Used   Substance and Sexual Activity    Alcohol use: No    Drug use: Never    Sexual activity: Not Currently   Other Topics Concern    Not on file   Social History Narrative    Not on file     Social Drivers of Health     Financial Resource Strain: Low Risk  (01/04/2023)    Overall Financial Resource Strain (CARDIA)      Difficulty of Paying Living Expenses: Not hard at all   Food Insecurity: No Food Insecurity (02/15/2024)    Hunger Vital Sign     Worried About Running Out of Food in the Last Year: Never true     Ran Out of Food in the Last Year: Never true   Transportation Needs: No Transportation Needs (02/15/2024)    PRAPARE - Therapist, Art (Medical): No     Lack of Transportation (Non-Medical): No   Physical Activity: Insufficiently Active (02/15/2024)    Exercise Vital Sign     Days of Exercise per Week: 2 days     Minutes of Exercise per Session: 30 min   Stress: No Stress Concern Present (01/04/2023)    [employer] of Occupational [employer] - Occupational Stress Questionnaire     Feeling of Stress : Not at all   Social Connections: Feeling Socially Integrated (02/26/2023)    OASIS D0700: Social Isolation     Frequency of experiencing loneliness or isolation: Never   Intimate Partner Violence: Not At Risk (01/04/2023)    Humiliation, Afraid, Rape, and Kick questionnaire     Fear of Current or Ex-Partner: No     Emotionally Abused: No     Physically Abused: No     Sexually Abused: No   Housing Stability: Low Risk  (02/15/2024)    Housing Stability Vital Sign     Unable to Pay for Housing in the Last Year: No     Number of Times Moved in the Last Year: 0     Homeless in the Last Year: No       REVIEW OF SYSTEMS:      No changes from previous review of systems unless noted.      Prescription medication management discussed with patient.     Electronically signed by: Morene LELON Dills, DO    Note: This note was completed using voice recognition software.  Any typographical/name errors or mistakes are unintentional.

## 2024-12-18 ENCOUNTER — Inpatient Hospital Stay: Admit: 2024-12-18 | Payer: Medicare (Managed Care) | Primary: Internal Medicine

## 2024-12-18 NOTE — Therapy Recertification (Signed)
 [facility] PHYSICAL THERAPY  [address], [place] [address]  Ey:[phone]  PHYSICAL THERAPY PROGRESS NOTE  Patient Name: Elaine [name] DOB: 1951/05/10   Medical/Treatment Diagnosis: Primary osteoarthritis of right hip  Pain in right hip  Lumbago with sciatica, right side   Referral Source: Marlyse Morene ORN, DO     Date of Initial Visit: 10/31/24 Attended Visits: 3 Missed Visits: 1     SUMMARY OF TREATMENT  Patient seen for eval and 2 follow up sessions. She reports transportation and scheduling issues. She also notes she has seen MD 2x and ER 1x along with MRI due to severe spasms and pain in the R groin and upper thigh and right leg giving out with walking and sit to stand transfers. She notes some improvement with pain with new medication however instability and giving out is still a chief complaint. This adds to her fear of falling and risk of fracturing something.     CURRENT STATUS  Short Term Goals: To be accomplished in  3-4 weeks:   1. Independent with HEP.  EVAL: N/A  PN  0x day   12/18/24  2. Decrease max pain to < 2/10 to assist with patient being able to stand for at least 30 mins to cook a meal or wash dishes without significant LBP or R hip pain.   EVAL: 10/10 max pain; 10-15 mins tolerance   PN max 10 12/18/24  3. Patient is no longer getting R leg radicular symptoms down to her toes.   EVAL: occasional radicular symptoms down lateral R leg to toes  PN not as bad with the pain down the leg, has improved 12/18/24     Long Term Goals: To be accomplished in  6-8  weeks:  1.  Decrease max pain to <2/10 to assist with patient being able to get out of bed in the morning without debilitating R hip and leg pain.   EVAL: 10/10 max pain   PN max 10 12/18/24  2.  Improve LEFS Score by 18 points in order to show significant functional improvement.  EVAL: 42 points  PN  26    12/18/24  3.  Improve L hip flexion and abduction strength to at  least 4/5 MMT and L knee flexion and extension strength to at least 4+/5 MMT to allow patient better tolerance to walking community distances and exercising.  EVAL:  see assessment  PN NA 12/18/24        Primary mitigating factor which impeded progress:  Scheduling or transportation issues    Functional Gains: no reports  Functional Deficits: pain in the R hip and leg and it spasms, it gives out all the time when I go to get up to walk-  % improvement: 0%  Pain   Average: with medication 5/10       Best: 0/10     Worst: 10/10  Patient Goal: I want it to stop giving out on me    Insurance: [employer] [facility] Managed) /     Medicare, cannot change goals, cannot adjust frequency/duration, no signature required   A Home Exercise Program (HEP) and Patient Education was given to the patient at the start of care.  This has been progressed throughout the course of care adjusting for tolerance, strength, stability, flexibility, range of motion, balance, and function and patient reports they are Non-compliant with HEP thus far.     Reporting Period: (  date from last Prog Note/Eval to current Prog Note/Recert)  10/31/24- 12/18/24  Current Certification Period: 10/31/24 - 12/31/24    RECOMMENDATIONS  Patient would benefit from the continuation of skilled rehab interventions, per initial Plan of Care or most recent Certification, for functional progress to achieving above stated clinically significant goals.    If you have any questions/comments please contact us  directly.  Thank you for allowing us  to assist in the care of your patient.    Berwyn Lat, PT       12/18/2024       4:22 PM    SIGNATURE NOT REQUIRED

## 2024-12-18 NOTE — Progress Notes (Signed)
 PHYSICAL / OCCUPATIONAL THERAPY - DAILY TREATMENT NOTE    Patient Name: Elaine [name]    Date: 12/18/2024    DOB: 1951/08/17  Insurance: [employer]mo (Medicare Managed) /     Patient DOB verified Yes     Visit #   Current / Total 3 16   Time   In / Out 410 455   Pain   In / Out 5 <5   Subjective Functional Status/Changes: I just don't know when the leg is going to give out on me.  I don't want to fall!     TREATMENT AREA =  Primary osteoarthritis of right hip  Pain in right hip  Lumbago with sciatica, right side    OBJECTIVE    PD     Therapeutic Procedures:    Tx Min Billable or 1:1 Min (if diff from Tx Min) Procedure, Rationale, Specifics   15 15 97110 Therapeutic Exercise (timed):  increase ROM, strength, coordination, balance, and proprioception to improve patient's ability to progress to PLOF and address remaining functional goals. (see flow sheet as applicable)     Details if applicable:       20 20 97530 Therapeutic Activity (timed):  use of dynamic activities replicating functional movements to increase ROM, strength, coordination, balance, and proprioception in order to improve patient's ability to progress to PLOF and address remaining functional goals.  (see flow sheet as applicable)     Details if applicable:     10 10 97112 Neuromuscular Re-Education (timed):  improve balance, coordination, kinesthetic sense, posture, core stability and proprioception to improve patient's ability to develop conscious control of individual muscles and awareness of position of extremities in order to progress to PLOF and address remaining functional goals. (see flow sheet as applicable)     Details if applicable:            Details if applicable:            Details if applicable:     45 45 MC BC Totals Reminder: bill using total billable min of TIMED therapeutic procedures (example: do not include dry needle or estim unattended, both untimed codes, in totals to left)  8-22 min = 1 unit; 23-37 min = 2 units; 38-52  min = 3 units; 53-67 min = 4 units; 68-82 min = 5 units   Total Total     Charge Capture    [x]   Patient Education billed concurrently with other procedures   [x]  Review HEP    []  Progressed/Changed HEP, detail:    []  Other detail:       Objective Information/Functional Measures/Assessment    Reports she has seen the MD 2x and went to ER 1x due to the pain in the hip - it was in the groin. Reports she has had MRI performed at [facility].  GOALS check  VC exercises and tech      Patient will continue to benefit from skilled PT / OT services to modify and progress therapeutic interventions, analyze and address functional mobility deficits, analyze and address ROM deficits, analyze and address strength deficits, analyze and address soft tissue restrictions, analyze and cue for proper movement patterns, analyze and modify for postural abnormalities, analyze and address imbalance/dizziness, and instruct in home and community integration to address functional deficits and attain remaining goals.    Progress toward goals / Updated goals:  []   See Progress Note/Recertification  Short Term Goals: To be accomplished in  3-4 weeks:  1. Independent with HEP.  EVAL: N/A  PN  0x day   12/18/24  2. Decrease max pain to < 2/10 to assist with patient being able to stand for at least 30 mins to cook a meal or wash dishes without significant LBP or R hip pain.   EVAL: 10/10 max pain; 10-15 mins tolerance   PN max 10 12/18/24  3. Patient is no longer getting R leg radicular symptoms down to her toes.   EVAL: occasional radicular symptoms down lateral R leg to toes  PN not as bad with the pain down the leg, has improved 12/18/24     Long Term Goals: To be accomplished in  6-8  weeks:  1.  Decrease max pain to <2/10 to assist with patient being able to get out of bed in the morning without debilitating R hip and leg pain.   EVAL: 10/10 max pain   PN max 10 12/18/24  2.  Improve LEFS Score by 18 points in order to show significant functional  improvement.  EVAL: 42 points  PN  26    12/18/24  3.  Improve L hip flexion and abduction strength to at least 4/5 MMT and L knee flexion and extension strength to at least 4+/5 MMT to allow patient better tolerance to walking community distances and exercising.  EVAL:  see assessment  PN NA 12/18/24        Next PN 12/31/24  RC due 12/31/24  Auth due (visit number/ date) 16 v thru 12/29/24     PLAN  - Continue Plan of Care  - Upgrade activities as tolerated  - Other : send PN     Berwyn Lat, PT    12/18/2024    4:21 PM  If an interpreting service was utilized for treatment of this patient, the contents of this document represent the material reviewed with the patient via the interpreter.     Future Appointments   Date Time Provider Department Center   12/25/2024  8:40 AM Waldon Marcus CROME, MD [facility] ECC DEP   02/20/2025  3:40 PM Waldon Marcus CROME, MD [facility] ECC DEP   06/04/2025  3:40 PM Terrill Jonetta BIRCH, MD [facility] NEUR BS AMB

## 2024-12-21 ENCOUNTER — Encounter

## 2024-12-21 MED ORDER — VALSARTAN 320 MG PO TABS
320 | ORAL_TABLET | Freq: Every day | ORAL | 3 refills | 90.00000 days | Status: AC
Start: 2024-12-21 — End: ?

## 2024-12-21 NOTE — Telephone Encounter (Signed)
"  Last ov:11/21/2024  Next ov:12/25/2024  Last refill:05/03/2024  "

## 2024-12-25 ENCOUNTER — Ambulatory Visit
Admit: 2024-12-25 | Discharge: 2024-12-25 | Payer: Medicare (Managed Care) | Attending: Internal Medicine | Primary: Internal Medicine

## 2024-12-25 ENCOUNTER — Encounter

## 2024-12-25 ENCOUNTER — Inpatient Hospital Stay: Admit: 2024-12-25 | Payer: Medicare (Managed Care) | Primary: Internal Medicine

## 2024-12-25 VITALS — BP 112/76 | HR 76 | Temp 98.10000°F | Resp 20 | Ht 62.0 in | Wt 153.0 lb

## 2024-12-25 DIAGNOSIS — E7800 Pure hypercholesterolemia, unspecified: Secondary | ICD-10-CM

## 2024-12-25 DIAGNOSIS — I5032 Chronic diastolic (congestive) heart failure: Principal | ICD-10-CM

## 2024-12-25 LAB — CBC WITH AUTO DIFFERENTIAL
Basophils %: 0.6 % (ref 0.0–2.0)
Basophils Absolute: 0.03 K/UL (ref 0.00–0.10)
Eosinophils %: 0.4 % (ref 0.0–5.0)
Eosinophils Absolute: 0.02 K/UL (ref 0.00–0.40)
Hematocrit: 32.5 % — ABNORMAL LOW (ref 35.0–45.0)
Hemoglobin: 9.9 g/dL — ABNORMAL LOW (ref 12.0–16.0)
Immature Granulocytes %: 0.4 % (ref 0.0–0.5)
Immature Granulocytes Absolute: 0.02 K/UL (ref 0.00–0.04)
Lymphocytes %: 8.1 % — ABNORMAL LOW (ref 21.0–52.0)
Lymphocytes Absolute: 0.43 K/UL — ABNORMAL LOW (ref 0.90–3.60)
MCH: 25.4 pg (ref 24.0–34.0)
MCHC: 30.5 g/dL — ABNORMAL LOW (ref 31.0–37.0)
MCV: 83.3 FL (ref 78.0–100.0)
MPV: 10.9 FL (ref 9.2–11.8)
Monocytes %: 4 % (ref 3.0–10.0)
Monocytes Absolute: 0.21 K/UL (ref 0.05–1.20)
Neutrophils %: 86.5 % — ABNORMAL HIGH (ref 40.0–73.0)
Neutrophils Absolute: 4.58 K/UL (ref 1.80–8.00)
Nucleated RBCs: 0 /100{WBCs}
Platelets: 359 K/uL (ref 135–420)
RBC: 3.9 M/uL — ABNORMAL LOW (ref 4.20–5.30)
RDW: 17.5 % — ABNORMAL HIGH (ref 11.6–14.5)
WBC: 5.3 K/uL (ref 4.6–13.2)
nRBC: 0 K/uL (ref 0.00–0.01)

## 2024-12-25 LAB — COMPREHENSIVE METABOLIC PANEL
ALT: 13 U/L (ref 10–35)
AST: 18 U/L (ref 10–38)
Albumin/Globulin Ratio: 1.2 (ref 0.8–1.7)
Albumin: 3.4 g/dL (ref 3.4–5.0)
Alk Phosphatase: 123 U/L — ABNORMAL HIGH (ref 45–117)
Anion Gap: 11 mmol/L (ref 3.0–18.0)
BUN/Creatinine Ratio: 9 — ABNORMAL LOW (ref 12–20)
BUN: 6 mg/dL (ref 6–23)
CO2: 29 mmol/L (ref 21–32)
Calcium: 9.3 mg/dL (ref 8.6–10.2)
Chloride: 103 mmol/L (ref 98–107)
Creatinine: 0.61 mg/dL (ref 0.6–1.3)
Est, Glom Filt Rate: >90 ml/min/1.73m2 (ref >60)
Globulin: 3 g/dL (ref 2.0–4.0)
Glucose: 115 mg/dL — ABNORMAL HIGH (ref 74–108)
Potassium: 3.9 mmol/L (ref 3.5–5.5)
Sodium: 144 mmol/L (ref 136–145)
Total Bilirubin: 0.6 mg/dL (ref 0.2–1.0)
Total Protein: 6.4 g/dL (ref 6.4–8.2)

## 2024-12-25 LAB — LIPID PANEL
Chol/HDL Ratio: 2.6 (ref 0–5.0)
Cholesterol, Total: 165 mg/dL
HDL: 63 mg/dL — ABNORMAL HIGH (ref 40–60)
LDL Cholesterol: 89 mg/dL (ref 0–100)
Triglycerides: 65 mg/dL (ref 0–150)
VLDL Cholesterol Calculated: 13 mg/dL

## 2024-12-25 LAB — TSH + FREE T4 PANEL
T4 Free: 1.5 ng/dL (ref 0.9–1.7)
TSH, 3rd Generation: 1.45 u[IU]/mL (ref 0.27–4.20)

## 2024-12-25 LAB — VITAMIN D 25 HYDROXY: Vit D, 25-Hydroxy: 47.4 ng/mL (ref 30.0–100.0)

## 2024-12-25 NOTE — Progress Notes (Signed)
 Elaine [name] (DOB:  Jan 14, 1951) is a 74 y.o. female established patient, here for evaluation of the following chief complaint(s):  Pre-op Exam (Dr. Rappaport/February 06, 2025 at [facility]/Cataract surgery Left eye )      Assessment & Plan   ASSESSMENT/PLAN:    ICD-10-CM    1. Chronic diastolic (congestive) heart failure (HCC)  I50.32 Currently stable patient will be referred back [facility] - Charlanne Chin, MD, Cardiology, [facility]) for routine follow-up.      2. Essential hypertension  I10 Well-controlled on Diovan  320 mg daily and amlodipine  5 mg daily.             Return in about 3 weeks (around 01/15/2025) for Preop for eye surgery .         Subjective   SUBJECTIVE/OBJECTIVE:  Patient presents today for follow-up of high blood pressure and history of CHF with preserved ejection fraction.  Patient was previously seen on Dr. Charlanne but has not seen him in over the last 6 months.  Will go ahead and refer her back for routine follow-up since she does not remember having diagnosis of congestive heart failure but record confirms it as well as echocardiogram.  Blood pressure is well-controlled today on Diovan  320 mg and Norvasc  5 mg.  She will follow-up in the next 3 weeks for her preop for cataract surgery in March.    Respiratory ROS: negative for - shortness of breath  Cardiovascular ROS: negative for - chest pain       Objective   BP 112/76   Pulse 76   Temp 98.1 F (36.7 C) (Oral)   Resp 20   Ht 1.575 m (5' 2)   Wt 69.4 kg (153 lb)   SpO2 95%   BMI 27.98 kg/m   Chest - clear to auscultation, no wheezes, rales or rhonchi, symmetric air entry  Heart - normal rate, regular rhythm, normal S1, S2, no murmurs, rubs, clicks or gallops  Extremities - no pedal edema noted       Current Outpatient Medications   Medication Sig    valsartan  (DIOVAN ) 320 MG tablet TAKE 1 TABLET EVERY DAY    baclofen  (LIORESAL ) 10 MG tablet Take 0.5 tablets by mouth nightly as needed (muscle spasms)     acetaminophen  (TYLENOL ) 500 MG tablet Take 2 tablets by mouth every 8 hours as needed for Pain    predniSONE  (DELTASONE ) 5 MG tablet Take 1 tablet by mouth daily for 28 days    montelukast  (SINGULAIR ) 10 MG tablet TAKE 1 TABLET EVERY DAY    gabapentin  (NEURONTIN ) 300 MG capsule Take 1 capsule by mouth 3 times daily for 180 days. Max Daily Amount: 900 mg    atorvastatin  (LIPITOR) 40 MG tablet TAKE 1 TABLET EVERY DAY (NEED MD APPOINTMENT)    levothyroxine  (SYNTHROID ) 88 MCG tablet Take 1 tablet by mouth Daily    amLODIPine  (NORVASC ) 5 MG tablet Take 1 tablet by mouth daily    moxifloxacin (VIGAMOX) 0.5 % ophthalmic solution instill 1 drop into right eye four times a day START 3 days prior to procedure    pantoprazole  (PROTONIX ) 40 MG tablet 1 tab(s) orally once a day    prednisoLONE acetate (PRED FORTE) 1 % ophthalmic suspension instill 1 drop into right eye 4 times daily starting 3 days prior to procedure    vitamin C (ASCORBIC ACID) 500 MG tablet Take 1 tablet by mouth daily    Multiple Vitamins-Minerals (MULTIVITAMIN ADULTS 50+ PO) Take  1 tablet by mouth daily    Misc Natural Products (NEURIVA PO) Take 1 tablet by mouth in the morning and at bedtime    NONFORMULARY Take 1 tablet by mouth every evening Viviscal -1 tablet every evening for hair and scalp    potassium chloride  (KLOR-CON  M) 10 MEQ extended release tablet TAKE 1 TABLET EVERY DAY    albuterol  sulfate HFA (PROVENTIL ;VENTOLIN ;PROAIR ) 108 (90 Base) MCG/ACT inhaler Inhale 2 puffs into the lungs every 4 hours as needed for Wheezing or Shortness of Breath    cyanocobalamin 1000 MCG tablet Take 1 tablet by mouth every evening    ZINC PO Take 1 tablet by mouth daily    latanoprost (XALATAN) 0.005 % ophthalmic solution Place 1 drop into both eyes nightly    lidocaine  (LIDODERM ) 5 % Place 1 patch onto the skin every 24 hours 12 hours on and 12 hours off    diclofenac  sodium (VOLTAREN ) 1 % GEL Apply 4 g topically 4 times daily (Patient not taking: Reported on  12/25/2024)     No current facility-administered medications for this visit.                 An electronic signature was used to authenticate this note.    --Kenton Fortin LITTIE PERCHES, MD

## 2024-12-25 NOTE — Progress Notes (Signed)
 Elaine [name] presents today for   Chief Complaint   Patient presents with    Pre-op Exam     Dr. Althea  February 06, 2025 at [facility]  Cataract surgery Left eye            Have you been to the ER, urgent care clinic since your last visit?  Hospitalized since your last visit?    NO    Have you seen or consulted any other health care providers outside of [facility] since your last visit?    Yes, Dr. Althea

## 2024-12-26 NOTE — Telephone Encounter (Signed)
 Patient cancelled appt. on 12/26/24 at 8:28am due to the patient getting off of work at 3:30pm & her appt. is at 3:30pm, she is not able to make it here on time.

## 2024-12-27 ENCOUNTER — Encounter: Payer: Medicare (Managed Care) | Primary: Internal Medicine

## 2024-12-28 ENCOUNTER — Inpatient Hospital Stay: Admit: 2024-12-28 | Payer: Medicare (Managed Care) | Primary: Internal Medicine

## 2024-12-28 NOTE — Progress Notes (Signed)
 PHYSICAL / OCCUPATIONAL THERAPY - DAILY TREATMENT NOTE    Patient Name: Elaine [name]    Date: 12/28/2024    DOB: December 06, 1951  Insurance: [employer]mo (Medicare Managed) /     Patient DOB verified Yes     Visit #   Current / Total 4 16   Time   In / Out 4:10 pm 4:48 pm    Pain   In / Out 4 0   Subjective Functional Status/Changes: I am not having too much pain right now but it gets that twinge and then my leg buckles.      TREATMENT AREA =  Primary osteoarthritis of right hip  Pain in right hip  Lumbago with sciatica, right side    OBJECTIVE      Therapeutic Procedures:    Tx Min Billable or 1:1 Min (if diff from Tx Min) Procedure, Rationale, Specifics   15  97110 Therapeutic Exercise (timed):  increase ROM, strength, coordination, balance, and proprioception to improve patient's ability to progress to PLOF and address remaining functional goals. (see flow sheet as applicable)     Details if applicable:       13  97530 Therapeutic Activity (timed):  use of dynamic activities replicating functional movements to increase ROM, strength, coordination, balance, and proprioception in order to improve patient's ability to progress to PLOF and address remaining functional goals.  (see flow sheet as applicable)     Details if applicable:     10  97112 Neuromuscular Re-Education (timed):  improve balance, coordination, kinesthetic sense, posture, core stability and proprioception to improve patient's ability to develop conscious control of individual muscles and awareness of position of extremities in order to progress to PLOF and address remaining functional goals. (see flow sheet as applicable)     Details if applicable:  R hip LAD and hip adductor stretching          Details if applicable:            Details if applicable:     38  MC BC Totals Reminder: bill using total billable min of TIMED therapeutic procedures (example: do not include dry needle or estim unattended, both untimed codes, in totals to  left)  8-22 min = 1 unit; 23-37 min = 2 units; 38-52 min = 3 units; 53-67 min = 4 units; 68-82 min = 5 units   Total Total     Charge Capture    [x]   Patient Education billed concurrently with other procedures   [x]  Review HEP    []  Progressed/Changed HEP, detail:    []  Other detail:       Objective Information/Functional Measures/Assessment    Pt tolerated slight increase in repetitions for most of her exercises today. Standing hip lifts while standing on the R is very challenging and pain gets groin pain during unilateral stance. Full tandem stance was a little too challenging so it was reduce to semi-tandem stance.     Patient will continue to benefit from skilled PT / OT services to modify and progress therapeutic interventions, analyze and address functional mobility deficits, analyze and address ROM deficits, analyze and address strength deficits, analyze and address soft tissue restrictions, analyze and cue for proper movement patterns, analyze and modify for postural abnormalities, analyze and address imbalance/dizziness, and instruct in home and community integration to address functional deficits and attain remaining goals.    Progress toward goals / Updated goals:  []   See Progress Note/Recertification  Short Term Goals: To be accomplished in  3-4 weeks:   1. Independent with HEP.  EVAL: N/A  PN  0x day   12/18/24    2. Decrease max pain to < 2/10 to assist with patient being able to stand for at least 30 mins to cook a meal or wash dishes without significant LBP or R hip pain.   EVAL: 10/10 max pain; 10-15 mins tolerance   PN max 10 12/18/24  Current: 4/10 pain upon entry to PT, 0/10 pain leaving   12/28/24    3. Patient is no longer getting R leg radicular symptoms down to her toes.   EVAL: occasional radicular symptoms down lateral R leg to toes  PN not as bad with the pain down the leg, has improved 12/18/24     Long Term Goals: To be accomplished in  6-8  weeks:  1.  Decrease max pain to <2/10 to  assist with patient being able to get out of bed in the morning without debilitating R hip and leg pain.   EVAL: 10/10 max pain   PN max 10 12/18/24  Current: 4/10 pain upon entry to PT, 0/10 pain leaving   12/28/24    2.  Improve LEFS Score by 18 points in order to show significant functional improvement.  EVAL: 42 points  PN  26    12/18/24  Current: NA   12/28/24    3.  Improve L hip flexion and abduction strength to at least 4/5 MMT and L knee flexion and extension strength to at least 4+/5 MMT to allow patient better tolerance to walking community distances and exercising.  EVAL:  see assessment  PN NA 12/18/24     Next PN 12/31/24  RC due 12/31/24  Auth due (visit number/ date) 16 v thru 12/29/24     PLAN  - Continue Plan of Care  - Upgrade activities as tolerated    Corean Freeman, PT    12/28/2024    4:18 PM  If an interpreting service was utilized for treatment of this patient, the contents of this document represent the material reviewed with the patient via the interpreter.     Future Appointments   Date Time Provider Department Center   12/29/2024  4:10 PM Blair Galloway, [place] [facility]   01/12/2025 10:00 AM Jared Clan, MD [facility] BS AMB   01/15/2025  3:20 PM Waldon Marcus CROME, MD [facility] ECC DEP   02/20/2025  3:40 PM Waldon Marcus CROME, MD [facility] ECC DEP   03/22/2025  3:00 PM Leonce Zebedee MATSU, MD [facility] BS AMB   06/04/2025  3:40 PM Terrill Jonetta BIRCH, MD [facility] BS AMB

## 2024-12-29 ENCOUNTER — Inpatient Hospital Stay: Payer: Medicare (Managed Care) | Primary: Internal Medicine

## 2025-01-10 ENCOUNTER — Inpatient Hospital Stay: Admit: 2025-01-10 | Payer: Medicare (Managed Care) | Primary: Internal Medicine

## 2025-01-10 NOTE — Therapy Recertification (Cosign Needed)
 [facility] PHYSICAL THERAPY  [address], [place] [address]  [phone]  CONTINUED PLAN OF CARE/RECERTIFICATION FOR PHYSICAL THERAPY            Patient Name: Elaine [name] DOB: March 31, 1951   Medical   Diagnosis: Primary osteoarthritis of right hip   Treatment Diagnosis: M25.551  RIGHT HIP PAIN  and M54.41  LUMBAGO WITH SCIATICA, RIGHT SIDE   Onset Date: 10/2024 Start of Care [facility]): 10/31/2024   Referral Source: Marlyse Morene ORN, DO Prior Hospitalization: See medical history   Prior Level of Function: Independent with ADLs and IADLs; retired   Comorbidities:  Respiratory disorders, Musculoskeletal disorders, and Other: asthma, HTN, thyroid dysfunction     Visits from [facility]: 5 Missed Visits: 2     Progress to Goals:    Short Term Goals: To be accomplished in  3-4 weeks:   1. Independent with HEP.  EVAL: N/A  PN  0x day   12/18/24  Recert: Patient reports compliance once a day. MET      2. Decrease max pain to < 2/10 to assist with patient being able to stand for at least 30 mins to cook a meal or wash dishes without significant LBP or R hip pain.   EVAL: 10/10 max pain; 10-15 mins tolerance   PN max 10 12/18/24  ReCert: Patient reports max pain to be 10/10 and explains that she is able to stand to wash dishes but has not stood long enough to cook a meal. Partially MET     3. Patient is no longer getting R leg radicular symptoms down to her toes.   EVAL: occasional radicular symptoms down lateral R leg to toes  PN not as bad with the pain down the leg, has improved 12/18/24  ReCert: Patient reports that she is no longer getting pain down her right leg and into her toes. MET     Long Term Goals: To be accomplished in  6-8  weeks:  1.  Decrease max pain to <2/10 to assist with patient being able to get out of bed in the morning without debilitating R hip and leg pain.   EVAL: 10/10 max pain   PN max 10 12/18/24  ReCert: Patient reports max pain to be  10/10 and explains that she is now able to get out of the bed without right LE pain. Partially MET     2.  Improve LEFS Score by 18 points in order to show significant functional improvement.  EVAL: 42 points  PN  26    12/18/24  ReCert: LEFS 56/80. Progressing     3.  Improve R hip flexion and abduction strength to at least 4/5 MMT and R knee flexion and extension strength to at least 4+/5 MMT to allow patient better tolerance to walking community distances and exercising.  EVAL:  see assessment  PN NA 12/18/24  ReCert: Right hip flexion MMT: 4/5 with pain; Right hip ABD MMT 4/5; Right knee flexion MMT: 4/5; Right knee extension MMT: 4+/5. Progressing     Primary mitigating factor which impeded progress:  Scheduling or transportation issues    A Home Exercise Program (HEP) and Patient Education was given to the patient at the start of care.  This has been progressed throughout the course of care adjusting for tolerance, strength, stability, flexibility, range of motion, and function and patient reports they are 100% compliant with HEP thus far. Once the patient has  met their discharge functional goals we will discontinue care at our facility and the patient will continue the most recently updated HEP and instructions in order to maintain their function.    Key Functional Changes/Progress: Decreased right hip pain/spasms, walking, use of cane   Problem List: pain affecting function, decreased ROM, decreased strength, impaired gait/balance, decreased ADL/functional abilities, decreased activity tolerance, decreased flexibility/joint mobility, and decreased transfer abilities    Treatment Plan may include any combination of the following: 02889 Therapeutic Exercise, 97112 Neuromuscular Re-Education, 97140 Manual Therapy, 97530 Therapeutic Activity, 97535 Self Care/Home Management, 97014 Electrical Stim unattended / [phone] [facility]), [id number] Electrical Stim attended, [phone] Gait Training, and (Elective Self Pay) Needle Insertion  w/o Injection (1 or 2 muscles), (3+ muscles)  Patient Goal(s) has been updated and includes: I want to be painless.     Goals for this certification period include and are to be achieved in   8  WEEKS    1. Decrease max pain to < 2/10 to assist with patient being able to stand for at least 30 mins to cook a meal or wash dishes without significant LBP or R hip pain.   EVAL: 10/10 max pain; 10-15 mins tolerance   PN max 10 12/18/24  ReCert: Patient reports max pain to be 10/10 and explains that she is able to stand to wash dishes but has not stood long enough to cook a meal.     2.  Improve LEFS Score by 18 points in order to show significant functional improvement.  EVAL: 42 points  PN  26    12/18/24  ReCert: LEFS 56/80.     3.  Improve R hip flexion and abduction strength to at least 4/5 MMT and R knee flexion and extension strength to at least 4+/5 MMT to allow patient better tolerance to walking community distances and exercising.  EVAL:  see assessment  PN NA 12/18/24  ReCert: Right hip flexion MMT: 4/5 with pain; Right hip ABD MMT 4/5; Right knee flexion MMT: 4/5; Right knee extension MMT: 4+/5.     4. Patient will negotiate 6'' stairs with reciprocal pattern, B HR and no increased reports of right hip pain to improve patients tolerance for negotiating stairs at home in order to help her daughter with the laundry.   ReCert: Patient reports increased right hip pain with stairs and reports she does not go upstairs at her house to get laundry due to that    Frequency / Duration:   Patient to be seen   2   times per week for   8    WEEKS    Assessments/Recommendations:     Patient has only attended 5 total PT sessions since the end of November due to awaiting insurance authorization and transportation issues. Patient explains that the since the start of PT she has noticed decreased right hip pain/spasms which improved her walking tolerance and ability to ambulate without her cane at times. Patient continues to  report increased right pain sitting for long periods of time and issues with stair negotiation. Patient reports 50% improvement with PT and will continue to benefit from additional PT sessions to modify and progress therapeutic interventions, analyze and address functional mobility deficits, analyze and address ROM deficits, analyze and address strength deficits, analyze and address soft tissue restrictions, analyze and cue for proper movement patterns, analyze and modify for postural abnormalities, and instruct in home and community integration to address functional deficits and attain remaining goals.  If you have any questions/comments please contact us  directly at [phone].   Thank you for allowing us  to assist in the care of your patient.    Certification Period: 01/10/2025 - 03/10/2025  Reporting Period (date from last assessment to current assessment): 12/18/2024-01/10/2025    Alphonza Dolly, PTA       01/10/2025       6:32 PM      ___ I have read the above report and request that my patient continue as recommended.   ___ I have read the above report and request that my patient continue therapy with the following changes/special instructions: ________________________________________________   ___ I have read the above report and request that my patient be discharged from therapy.     Physician's Signature:_________________________   DATE:_________   TIME:________                           Marlyse Morene ORN, DO    ** Signature, Date and Time must be completed for valid certification **  Please sign and fax to [facility] [phone].  Thank you

## 2025-01-10 NOTE — Progress Notes (Signed)
 PHYSICAL / OCCUPATIONAL THERAPY - DAILY TREATMENT NOTE    Patient Name: Elaine [name]    Date: 01/10/2025    DOB: 1951/03/05  Insurance: [employer] H[employer] (Medicare Managed) /     Patient DOB verified Yes     Visit #   Current / Total 1 16   Time   In / Out 500 pm  553 pm    Pain   In / Out 0/10  0/10    Subjective Functional Status/Changes: Patient reports that her hip has been feeling better these last couple of weeks and denies any pain today      TREATMENT AREA =  Primary osteoarthritis of right hip  Pain in right hip  Lumbago with sciatica, right side    OBJECTIVE         Therapeutic Procedures:    Tx Min Billable or 1:1 Min (if diff from Tx Min) Procedure, Rationale, Specifics   15  97110 Therapeutic Exercise (timed):  increase ROM, strength, coordination, balance, and proprioception to improve patient's ability to progress to PLOF and address remaining functional goals. (see flow sheet as applicable)     Details if applicable:       10  97112 Neuromuscular Re-Education (timed):  improve balance, coordination, kinesthetic sense, posture, core stability and proprioception to improve patient's ability to develop conscious control of individual muscles and awareness of position of extremities in order to progress to PLOF and address remaining functional goals. (see flow sheet as applicable)     Details if applicable:     20  97530 Therapeutic Activity (timed):  use of dynamic activities replicating functional movements to increase ROM, strength, coordination, balance, and proprioception in order to improve patient's ability to progress to PLOF and address remaining functional goals.  (see flow sheet as applicable)     Details if applicable:     8  97140 Manual Therapy (timed):  Use of assisted stretching, PROM, and soft tissue massage to decrease pain, increase ROM, increase tissue extensibility, and decrease trigger points to improve patient's ability to progress to PLOF and address remaining functional  goals.  The manual therapy interventions were performed at a separate and distinct time from the therapeutic activities interventions . (see flow sheet as applicable)     Details if applicable:            Details if applicable:     53  MC BC Totals Reminder: bill using total billable min of TIMED therapeutic procedures (example: do not include dry needle or estim unattended, both untimed codes, in totals to left)  8-22 min = 1 unit; 23-37 min = 2 units; 38-52 min = 3 units; 53-67 min = 4 units; 68-82 min = 5 units   Total Total     Charge Capture    [x]   Patient Education billed concurrently with other procedures   [x]  Review HEP    []  Progressed/Changed HEP, detail:    []  Other detail:       Objective Information/Functional Measures/Assessment    Functional Gains: Decreased right hip pain/spasms, walking, use of cane  Functional Deficits: Increased right hip pain after prolonged sitting, stair negotiation  % improvement: 50% improvement   Pain   Average: 3/10       Best: 0/10     Worst: 10/10  Patient Goal: I want to be painless.     Patient has only attended 5 total PT sessions since the end of November due to awaiting  insurance authorization and transportation issues. Patient explains that the since the start of PT she has noticed decreased right hip pain/spasms which improved her walking tolerance and ability to ambulate without her cane at times. Patient continues to report increased right pain sitting for long periods of time and issues with stair negotiation. Patient reports 50% improvement with PT and will continue to benefit from additional PT sessions to modify and progress therapeutic interventions, analyze and address functional mobility deficits, analyze and address ROM deficits, analyze and address strength deficits, analyze and address soft tissue restrictions, analyze and cue for proper movement patterns, analyze and modify for postural abnormalities, and instruct in home and community integration  to address functional deficits and attain remaining goals.    Progress toward goals / Updated goals:  []   See Progress Note/Recertification      Short Term Goals: To be accomplished in  3-4 weeks:   1. Independent with HEP.  EVAL: N/A  PN  0x day   12/18/24  Recert: Patient reports compliance once a day. MET      2. Decrease max pain to < 2/10 to assist with patient being able to stand for at least 30 mins to cook a meal or wash dishes without significant LBP or R hip pain.   EVAL: 10/10 max pain; 10-15 mins tolerance   PN max 10 12/18/24  ReCert: Patient reports max pain to be 10/10 and explains that she is able to stand to wash dishes but has not stood long enough to cook a meal. Partially MET     3. Patient is no longer getting R leg radicular symptoms down to her toes.   EVAL: occasional radicular symptoms down lateral R leg to toes  PN not as bad with the pain down the leg, has improved 12/18/24  ReCert: Patient reports that she is no longer getting pain down her right leg and into her toes. MET     Long Term Goals: To be accomplished in  6-8  weeks:  1.  Decrease max pain to <2/10 to assist with patient being able to get out of bed in the morning without debilitating R hip and leg pain.   EVAL: 10/10 max pain   PN max 10 12/18/24  ReCert: Patient reports max pain to be 10/10 and explains that she is now able to get out of the bed without right LE pain. Partially MET     2.  Improve LEFS Score by 18 points in order to show significant functional improvement.  EVAL: 42 points  PN  26    12/18/24  ReCert: LEFS 56/80. Progressing     3.  Improve R hip flexion and abduction strength to at least 4/5 MMT and R knee flexion and extension strength to at least 4+/5 MMT to allow patient better tolerance to walking community distances and exercising.  EVAL:  see assessment  PN NA 12/18/24  ReCert: Right hip flexion MMT: 4/5 with pain; Right hip ABD MMT 4/5; Right knee flexion MMT: 4/5; Right knee extension MMT: 4+/5.  Progressing     Next PN 02/07/2025  RC or Medicaid tracking due 4/03/10/2025  Auth due (visit number/ date) 16 visits expier 03/07/2025    PLAN  - Continue Plan of Care  - Upgrade activities as tolerated    Alphonza Dolly, PTA    01/10/2025    10:37 AM  If an interpreting service was utilized for treatment of this patient, the contents of this document represent  the material reviewed with the patient via the interpreter.     Future Appointments   Date Time Provider Department Center   01/10/2025  4:50 PM Blair Galloway, PTA [facility]   01/12/2025 10:00 AM Jared Clan, MD [facility] BS AMB   01/17/2025  4:10 PM Blair Galloway, PTA [facility]   01/19/2025  4:10 PM Blair Galloway, PTA [facility]   01/22/2025  4:00 PM Waldon Marcus CROME, MD [facility] E[facility] DEP   02/05/2025  4:10 PM Blair Galloway, PTA [facility]   02/20/2025  3:40 PM Waldon Marcus CROME, MD [facility] ECC DEP   03/22/2025  3:00 PM Leonce Zebedee MATSU, MD [facility] BS AMB   06/04/2025  3:40 PM Terrill Jonetta BIRCH, MD [facility] BS AMB

## 2025-01-12 ENCOUNTER — Encounter: Payer: Medicare (Managed Care) | Primary: Internal Medicine

## 2025-01-15 ENCOUNTER — Ambulatory Visit: Payer: Medicare (Managed Care) | Attending: Internal Medicine | Primary: Internal Medicine

## 2025-01-17 ENCOUNTER — Encounter: Payer: Medicare (Managed Care) | Primary: Internal Medicine

## 2025-01-19 ENCOUNTER — Encounter: Payer: Medicare (Managed Care) | Primary: Internal Medicine

## 2025-01-22 ENCOUNTER — Ambulatory Visit: Payer: Medicare (Managed Care) | Attending: Internal Medicine | Primary: Internal Medicine
